# Patient Record
Sex: Male | Born: 1980
Health system: Southern US, Community
[De-identification: ages and names within clinical notes are randomized; demographics above are authoritative.]

## PROBLEM LIST (undated history)

## (undated) DIAGNOSIS — R1115 Cyclical vomiting syndrome unrelated to migraine: Secondary | ICD-10-CM

## (undated) DIAGNOSIS — K828 Other specified diseases of gallbladder: Secondary | ICD-10-CM

## (undated) DIAGNOSIS — K2990 Gastroduodenitis, unspecified, without bleeding: Secondary | ICD-10-CM

## (undated) DIAGNOSIS — F1910 Other psychoactive substance abuse, uncomplicated: Secondary | ICD-10-CM

## (undated) DIAGNOSIS — R109 Unspecified abdominal pain: Secondary | ICD-10-CM

## (undated) DIAGNOSIS — K2970 Gastritis, unspecified, without bleeding: Secondary | ICD-10-CM

## (undated) DIAGNOSIS — G8929 Other chronic pain: Secondary | ICD-10-CM

## (undated) DIAGNOSIS — R112 Nausea with vomiting, unspecified: Secondary | ICD-10-CM

## (undated) DIAGNOSIS — K3184 Gastroparesis: Secondary | ICD-10-CM

## (undated) DIAGNOSIS — A048 Other specified bacterial intestinal infections: Secondary | ICD-10-CM

---

## 2007-02-26 ENCOUNTER — Emergency Department (HOSPITAL_COMMUNITY): Admission: EM | Admit: 2007-02-26 | Discharge: 2007-02-26 | Payer: Self-pay | Admitting: Emergency Medicine

## 2007-03-08 ENCOUNTER — Emergency Department (HOSPITAL_COMMUNITY): Admission: EM | Admit: 2007-03-08 | Discharge: 2007-03-08 | Payer: Self-pay | Admitting: Emergency Medicine

## 2008-11-24 ENCOUNTER — Ambulatory Visit: Payer: Self-pay | Admitting: Cardiology

## 2008-11-24 ENCOUNTER — Inpatient Hospital Stay (HOSPITAL_COMMUNITY): Admission: EM | Admit: 2008-11-24 | Discharge: 2008-11-29 | Payer: Self-pay | Admitting: Emergency Medicine

## 2008-11-26 ENCOUNTER — Encounter (INDEPENDENT_AMBULATORY_CARE_PROVIDER_SITE_OTHER): Payer: Self-pay | Admitting: Internal Medicine

## 2008-12-06 ENCOUNTER — Emergency Department (HOSPITAL_COMMUNITY): Admission: EM | Admit: 2008-12-06 | Discharge: 2008-12-06 | Payer: Self-pay | Admitting: Emergency Medicine

## 2009-02-14 ENCOUNTER — Emergency Department (HOSPITAL_COMMUNITY): Admission: EM | Admit: 2009-02-14 | Discharge: 2009-02-15 | Payer: Self-pay | Admitting: Emergency Medicine

## 2009-10-28 ENCOUNTER — Emergency Department (HOSPITAL_COMMUNITY): Admission: EM | Admit: 2009-10-28 | Discharge: 2009-10-28 | Payer: Self-pay | Admitting: Emergency Medicine

## 2009-11-13 HISTORY — PX: CHOLECYSTECTOMY: SHX55

## 2009-11-16 ENCOUNTER — Emergency Department (HOSPITAL_COMMUNITY): Admission: EM | Admit: 2009-11-16 | Discharge: 2009-11-16 | Payer: Self-pay | Admitting: Emergency Medicine

## 2009-11-18 ENCOUNTER — Inpatient Hospital Stay (HOSPITAL_COMMUNITY): Admission: EM | Admit: 2009-11-18 | Discharge: 2009-11-21 | Payer: Self-pay | Admitting: Emergency Medicine

## 2009-11-20 ENCOUNTER — Encounter (INDEPENDENT_AMBULATORY_CARE_PROVIDER_SITE_OTHER): Payer: Self-pay

## 2009-11-21 ENCOUNTER — Emergency Department (HOSPITAL_COMMUNITY): Admission: EM | Admit: 2009-11-21 | Discharge: 2009-11-22 | Payer: Self-pay | Admitting: Emergency Medicine

## 2009-11-23 ENCOUNTER — Observation Stay (HOSPITAL_COMMUNITY): Admission: EM | Admit: 2009-11-23 | Discharge: 2009-11-25 | Payer: Self-pay | Admitting: Emergency Medicine

## 2009-11-23 DIAGNOSIS — K828 Other specified diseases of gallbladder: Secondary | ICD-10-CM | POA: Insufficient documentation

## 2009-11-23 HISTORY — DX: Other specified diseases of gallbladder: K82.8

## 2009-11-24 ENCOUNTER — Encounter (INDEPENDENT_AMBULATORY_CARE_PROVIDER_SITE_OTHER): Payer: Self-pay

## 2009-11-25 DIAGNOSIS — Z9089 Acquired absence of other organs: Secondary | ICD-10-CM | POA: Insufficient documentation

## 2009-12-07 ENCOUNTER — Emergency Department (HOSPITAL_COMMUNITY): Admission: EM | Admit: 2009-12-07 | Discharge: 2009-12-07 | Payer: Self-pay | Admitting: Emergency Medicine

## 2009-12-08 ENCOUNTER — Observation Stay (HOSPITAL_COMMUNITY): Admission: EM | Admit: 2009-12-08 | Discharge: 2009-12-08 | Payer: Self-pay | Admitting: Emergency Medicine

## 2009-12-08 LAB — CONVERTED CEMR LAB
ALT: 57 units/L
AST: 31 units/L
Albumin: 3.8 g/dL
Alkaline Phosphatase: 61 units/L
BUN: 7 mg/dL
Basophils Absolute: 0.1 10*3/uL
Basophils Relative: 1 %
CO2: 26 meq/L
Calcium: 9.7 mg/dL
Chloride: 106 meq/L
Creatinine, Ser: 0.93 mg/dL
Eosinophils Absolute: 0 10*3/uL
Eosinophils Relative: 0 %
Glucose, Bld: 103 mg/dL
HCT: 38 %
Hemoglobin: 13.1 g/dL
Lymphocytes Relative: 12 %
Lymphs Abs: 1.4 10*3/uL
MCHC: 34.6 g/dL
MCV: 98.1 fL
Monocytes Absolute: 0.7 10*3/uL
Monocytes Relative: 6 %
Neutro Abs: 9.3 10*3/uL
Neutrophils Relative %: 81 %
Platelets: 234 10*3/uL
Potassium: 3.5 meq/L
RBC: 3.87 M/uL
RDW: 12.4 %
Sodium: 140 meq/L
Total Bilirubin: 2.1 mg/dL
Total Protein: 7.2 g/dL
WBC: 11.5 10*3/uL

## 2009-12-09 ENCOUNTER — Emergency Department (HOSPITAL_COMMUNITY): Admission: EM | Admit: 2009-12-09 | Discharge: 2009-12-09 | Payer: Self-pay | Admitting: Emergency Medicine

## 2009-12-09 ENCOUNTER — Ambulatory Visit: Payer: Self-pay | Admitting: Nurse Practitioner

## 2009-12-09 DIAGNOSIS — K2970 Gastritis, unspecified, without bleeding: Secondary | ICD-10-CM

## 2009-12-09 DIAGNOSIS — K2990 Gastroduodenitis, unspecified, without bleeding: Secondary | ICD-10-CM

## 2009-12-09 HISTORY — DX: Gastroduodenitis, unspecified, without bleeding: K29.90

## 2009-12-09 HISTORY — DX: Gastritis, unspecified, without bleeding: K29.70

## 2009-12-11 ENCOUNTER — Emergency Department (HOSPITAL_COMMUNITY): Admission: EM | Admit: 2009-12-11 | Discharge: 2009-12-11 | Payer: Self-pay | Admitting: Emergency Medicine

## 2009-12-12 ENCOUNTER — Inpatient Hospital Stay (HOSPITAL_COMMUNITY): Admission: EM | Admit: 2009-12-12 | Discharge: 2009-12-14 | Payer: Self-pay | Admitting: Emergency Medicine

## 2009-12-14 DIAGNOSIS — A048 Other specified bacterial intestinal infections: Secondary | ICD-10-CM | POA: Insufficient documentation

## 2009-12-14 HISTORY — DX: Other specified bacterial intestinal infections: A04.8

## 2009-12-14 LAB — CONVERTED CEMR LAB
ALT: 43 units/L (ref 0–53)
AST: 23 units/L (ref 0–37)
Albumin: 4.2 g/dL (ref 3.5–5.2)
Alkaline Phosphatase: 53 units/L (ref 39–117)
BUN: 12 mg/dL (ref 6–23)
Basophils Absolute: 0 10*3/uL (ref 0.0–0.1)
Basophils Relative: 0 % (ref 0–1)
Bilirubin, Direct: 0.3 mg/dL (ref 0.0–0.3)
CO2: 27 meq/L (ref 19–32)
Calcium: 9.7 mg/dL (ref 8.4–10.5)
Chloride: 104 meq/L (ref 96–112)
Creatinine, Ser: 0.89 mg/dL (ref 0.40–1.50)
Eosinophils Absolute: 0 10*3/uL (ref 0.0–0.7)
Eosinophils Relative: 1 % (ref 0–5)
Glucose, Bld: 83 mg/dL (ref 70–99)
HCT: 37.7 % — ABNORMAL LOW (ref 39.0–52.0)
Helicobacter pylori, IgM: 1.12 — ABNORMAL HIGH (ref <0.89)
Hemoglobin: 12 g/dL — ABNORMAL LOW (ref 13.0–17.0)
Lymphocytes Relative: 46 % (ref 12–46)
Lymphs Abs: 2.9 10*3/uL (ref 0.7–4.0)
MCHC: 31.8 g/dL (ref 30.0–36.0)
MCV: 97.2 fL (ref 78.0–100.0)
Monocytes Absolute: 0.4 10*3/uL (ref 0.1–1.0)
Monocytes Relative: 7 % (ref 3–12)
Neutro Abs: 3 10*3/uL (ref 1.7–7.7)
Neutrophils Relative %: 47 % (ref 43–77)
Platelets: 261 10*3/uL (ref 150–400)
Potassium: 4 meq/L (ref 3.5–5.3)
RBC: 3.88 M/uL — ABNORMAL LOW (ref 4.22–5.81)
RDW: 12.4 % (ref 11.5–15.5)
Sodium: 141 meq/L (ref 135–145)
Total Bilirubin: 1.8 mg/dL — ABNORMAL HIGH (ref 0.3–1.2)
Total Protein: 7 g/dL (ref 6.0–8.3)
WBC: 6.4 10*3/uL (ref 4.0–10.5)

## 2009-12-15 ENCOUNTER — Inpatient Hospital Stay (HOSPITAL_COMMUNITY): Admission: EM | Admit: 2009-12-15 | Discharge: 2009-12-18 | Payer: Self-pay | Admitting: Emergency Medicine

## 2009-12-18 ENCOUNTER — Encounter (INDEPENDENT_AMBULATORY_CARE_PROVIDER_SITE_OTHER): Payer: Self-pay | Admitting: *Deleted

## 2009-12-21 ENCOUNTER — Emergency Department (HOSPITAL_COMMUNITY): Admission: EM | Admit: 2009-12-21 | Discharge: 2009-12-21 | Payer: Self-pay | Admitting: Emergency Medicine

## 2009-12-22 ENCOUNTER — Encounter (INDEPENDENT_AMBULATORY_CARE_PROVIDER_SITE_OTHER): Payer: Self-pay | Admitting: Nurse Practitioner

## 2009-12-22 ENCOUNTER — Emergency Department (HOSPITAL_COMMUNITY): Admission: EM | Admit: 2009-12-22 | Discharge: 2009-12-22 | Payer: Self-pay | Admitting: Emergency Medicine

## 2009-12-23 ENCOUNTER — Emergency Department (HOSPITAL_COMMUNITY): Admission: EM | Admit: 2009-12-23 | Discharge: 2009-12-23 | Payer: Self-pay | Admitting: Emergency Medicine

## 2010-01-01 ENCOUNTER — Encounter (INDEPENDENT_AMBULATORY_CARE_PROVIDER_SITE_OTHER): Payer: Self-pay | Admitting: Nurse Practitioner

## 2010-02-22 ENCOUNTER — Encounter (INDEPENDENT_AMBULATORY_CARE_PROVIDER_SITE_OTHER): Payer: Self-pay | Admitting: Nurse Practitioner

## 2010-09-15 NOTE — Letter (Signed)
Summary: *HSN Results Follow up  [facility]  [address] [place], [place] [address]   Phone: [phone]  Fax: [phone]      12/18/2009   Matthew Scott [address] [place], [place]  [address]   Dear  Mr. [name],                            ____S.Drinkard,FNP   ____D. Gore,FNP       ____B. McPherson,MD   ____V. Rankins,MD    ____E. Mulberry,MD    ____N. Daphine Deutscher, FNP  ____D. Reche Dixon, MD    ____K. Philipp Deputy, MD    ____Other     This letter is to inform you that your recent test(s):  _______Pap Smear    _______Lab Test     _______X-ray    _______ is within acceptable limits  _______ requires a medication change  _______ requires a follow-up lab visit  _______ requires a follow-up visit with your provider   Comments:  We tried to reach you and was told that you were in the hospital.  Please contact the office at your earliest convenience.       _________________________________________________________ If you have any questions, please contact our office                     Sincerely,  Levon Hedger [facility]            Appended Document: needs F/U Left message on answer machine to return call Gaylyn Cheers RN Dec 31, 2009 10:59  Steward Drone has spoken with the a family member and pt missed his last appt in this office and therefore has not picked up med due to another hospitalization.   Check to see if pt is still in the hospital (print d/c and put on my desk) once pt is reached see what his f/u plan is at this point  Discharge summary printed. Has eligibility appt. 01/26/10 will schedule appt. once he has his orange card, mother states he is doing well. Gaylyn Cheers RN Dec 31, 2009 5:00 pm  d/c received.  will review. n.martin,fnp  Jan 01, 2010  8:23 AM    Clinical Lists Changes

## 2010-09-15 NOTE — Letter (Signed)
Summary: PT Matthew Scott  PT INFORAMTION SHEET   Imported By: Arta Bruce 02/02/2010 15:53:54  _____________________________________________________________________  External Attachment:    Type:   Image     Comment:   External Document

## 2010-09-15 NOTE — Consult Note (Signed)
Summary: Consultation Report//GASTROENTEROLOGIST  Consultation Report//GASTROENTEROLOGIST   Imported By: Arta Bruce 01/06/2010 11:45:39  _____________________________________________________________________  External Attachment:    Type:   Image     Comment:   External Document

## 2010-09-15 NOTE — Miscellaneous (Signed)
Summary: Update as per Hospitalization 12/22/2009  Clinical Lists Changes Pt was to f/u with [facility] on May 11 at 2:00 pm  Observations: Added new observation of PMH BARRETTS: no (01/01/2010 8:23) Added new observation of PMH ESOPHAG: no (01/01/2010 8:23) Added new observation of CHIEF CMPLNT: Abdominal Pain (01/01/2010 8:23) Added new observation of EGD: proximal gastritis most likely from trauma from vomiting.  moderate amount of retained bilious fluid in the stomach (11/24/2009 8:25)      EGD  Procedure date:  11/24/2009  Findings:      proximal gastritis most likely from trauma from vomiting.  moderate amount of retained bilious fluid in the stomach   Dyspepsia History:      A prior EGD has been done which showed no evidence for moderate or severe esophagitis or Barrett's esophagus.       EGD  Procedure date:  11/24/2009  Findings:      proximal gastritis most likely from trauma from vomiting.  moderate amount of retained bilious fluid in the stomach

## 2010-09-15 NOTE — Letter (Signed)
Summary: FAXED REQUESTED RECORDS TO [facility]  FAXED REQUESTED RECORDS TO [facility]   Imported By: Arta Bruce 02/22/2010 10:37:15  _____________________________________________________________________  External Attachment:    Type:   Image     Comment:   External Document

## 2010-09-15 NOTE — Assessment & Plan Note (Signed)
Summary: New - Establish Care   Vital Signs:  Patient profile:   30 year old male Height:      68.25 inches Weight:      187.1 pounds BMI:     28.34 BSA:     1.99 Temp:     98.1 degrees F oral Pulse rate:   65 / minute Pulse rhythm:   regular Resp:     16 per minute BP sitting:   122 / 76  (left arm) Cuff size:   large  Vitals Entered By: Levon Hedger (December 09, 2009 2:15 PM) CC: follow-up visit [facility] Is Patient Diabetic? No Pain Assessment Patient in pain? no       Does patient need assistance? Functional Status Self care Ambulation Normal Comments pt states he takes Valtrex   CC:  follow-up visit [facility].  History of Present Illness:  Pt into the office to establish care. No previous PCP  Hospitalized from November 23, 2009 to November 25, 2009 for persistent nausea and vomiting. S/p laparoscopic cholecystectomy November 20, 2009. Bilirubin elevated at 2.0, fractionated bilirubin showed an indirect of 1.8 with a directed 0.2 CT scan showed possible ill-defined fluid and gas collection in the operative bed consistent with postoperative changes. HIDA scan was subsequently ordered and found to be negative for any evidence of ductal obstruction or cystic duct leak. EGD - essentially normal showing no evidence of obvious ulceration. As of November 25, 2009 he tolerated regular diet.  Pt went to the ER on last night, still with nausea and vomiting. Mono negative Discharge indicates that pt was started on pylera 140mg -125-125 (3 capsules by mouth qid x 10 days) pt is not able to afford this medication so he did not get it. He has been using hycosamine SL tabs. Reports that after the ER visit on yesterday he ate meatloaf for dinner   **Exam shortened due to pt's transportation had to leave** Will have pt f/u in 1 weeks   Habits & Providers  Alcohol-Tobacco-Diet     Alcohol drinks/day: 0     Tobacco Status: never  Exercise-Depression-Behavior     Have you felt down or  hopeless? no     Have you felt little pleasure in things? no     Drug Use: marijuanna  Allergies (verified): No Known Drug Allergies  Past History:  Past Surgical History: Cholecystectomy 11/20/2009  Family History:  non-contributory  Social History: no children tobacco - none ETOH - none marijuana - dailySmoking Status:  never Drug Use:  marijuanna  Review of Systems CV:  Denies chest pain or discomfort. Resp:  Denies cough. GI:  Complains of nausea and vomiting; denies constipation.  Physical Exam  General:  alert.  long dreads Head:  normocephalic.   Msk:  up to the exam table Neurologic:  alert & oriented X3.   Skin:  color normal.   Psych:  Oriented X3.     Impression & Recommendations:  Problem # 1:  GASTRITIS (ICD-535.50) will check h.pylori today pt ordered pepcid s/p hospital stay - will advise him to continue use will wait to see lab results Orders: T-H Pylori Antibody IgM ([id number]) T-Comprehensive Metabolic Panel ([id number]) T-CBC w/Diff ([id number])  Problem # 2:  CHOLECYSTECTOMY, HX OF (ICD-V45.79) advised pt on foods he should avoid BLAND diet  Patient Instructions: 1)  Read the handout.  Please try to avoid the foods that make your stomach worse such as: 2)  Tomato sauce (Spagetti, lasagna, pizza) 3)  Orange juice 4)  Hot sauce 5)  Onions 6)  Try to eat baked foods instead of fried foods. 7)  No hamburgers 8)  Eat chicken (baked) and [place] 9)  May eat rice, potatos, bananas, apples 10)  Follow up in 1 week for stomach issues (will see once more) 11)  Keep your appointment in June for eligibility

## 2010-11-02 LAB — BASIC METABOLIC PANEL
BUN: 12 mg/dL (ref 6–23)
BUN: 6 mg/dL (ref 6–23)
CO2: 28 mEq/L (ref 19–32)
CO2: 29 mEq/L (ref 19–32)
Calcium: 9.4 mg/dL (ref 8.4–10.5)
Calcium: 9.5 mg/dL (ref 8.4–10.5)
Chloride: 102 mEq/L (ref 96–112)
Chloride: 103 mEq/L (ref 96–112)
Creatinine, Ser: 0.89 mg/dL (ref 0.4–1.5)
Creatinine, Ser: 1.03 mg/dL (ref 0.4–1.5)
GFR calc Af Amer: >60 mL/min (ref >60)
GFR calc Af Amer: >60 mL/min (ref >60)
GFR calc non Af Amer: >60 mL/min (ref >60)
GFR calc non Af Amer: >60 mL/min (ref >60)
Glucose, Bld: 89 mg/dL (ref 70–99)
Glucose, Bld: 93 mg/dL (ref 70–99)
Potassium: 3.2 mEq/L — ABNORMAL LOW (ref 3.5–5.1)
Potassium: 3.7 mEq/L (ref 3.5–5.1)
Sodium: 137 mEq/L (ref 135–145)
Sodium: 138 mEq/L (ref 135–145)

## 2010-11-02 LAB — CBC
HCT: 37 % — ABNORMAL LOW (ref 39.0–52.0)
HCT: 38.5 % — ABNORMAL LOW (ref 39.0–52.0)
HCT: 38.8 % — ABNORMAL LOW (ref 39.0–52.0)
HCT: 40.1 % (ref 39.0–52.0)
HCT: 35.8 % — ABNORMAL LOW (ref 39.0–52.0)
HCT: 38 % — ABNORMAL LOW (ref 39.0–52.0)
HCT: 38.7 % — ABNORMAL LOW (ref 39.0–52.0)
HCT: 42 % (ref 39.0–52.0)
Hemoglobin: 12.6 g/dL — ABNORMAL LOW (ref 13.0–17.0)
Hemoglobin: 13 g/dL (ref 13.0–17.0)
Hemoglobin: 13.3 g/dL (ref 13.0–17.0)
Hemoglobin: 13.7 g/dL (ref 13.0–17.0)
Hemoglobin: 12.2 g/dL — ABNORMAL LOW (ref 13.0–17.0)
Hemoglobin: 13.1 g/dL (ref 13.0–17.0)
Hemoglobin: 13.1 g/dL (ref 13.0–17.0)
Hemoglobin: 14.2 g/dL (ref 13.0–17.0)
MCHC: 33.5 g/dL (ref 30.0–36.0)
MCHC: 34.1 g/dL (ref 30.0–36.0)
MCHC: 34.2 g/dL (ref 30.0–36.0)
MCHC: 34.6 g/dL (ref 30.0–36.0)
MCHC: 33.8 g/dL (ref 30.0–36.0)
MCHC: 34 g/dL (ref 30.0–36.0)
MCHC: 34.1 g/dL (ref 30.0–36.0)
MCHC: 34.6 g/dL (ref 30.0–36.0)
MCV: 97.7 fL (ref 78.0–100.0)
MCV: 98.3 fL (ref 78.0–100.0)
MCV: 99 fL (ref 78.0–100.0)
MCV: 99.1 fL (ref 78.0–100.0)
MCV: 98.1 fL (ref 78.0–100.0)
MCV: 98.8 fL (ref 78.0–100.0)
MCV: 99 fL (ref 78.0–100.0)
MCV: 99.4 fL (ref 78.0–100.0)
Platelets: 219 10*3/uL (ref 150–400)
Platelets: 225 10*3/uL (ref 150–400)
Platelets: 231 10*3/uL (ref 150–400)
Platelets: 246 10*3/uL (ref 150–400)
Platelets: 211 10*3/uL (ref 150–400)
Platelets: 228 10*3/uL (ref 150–400)
Platelets: 234 10*3/uL (ref 150–400)
Platelets: 255 10*3/uL (ref 150–400)
RBC: 3.74 MIL/uL — ABNORMAL LOW (ref 4.22–5.81)
RBC: 3.92 MIL/uL — ABNORMAL LOW (ref 4.22–5.81)
RBC: 3.94 MIL/uL — ABNORMAL LOW (ref 4.22–5.81)
RBC: 4.08 MIL/uL — ABNORMAL LOW (ref 4.22–5.81)
RBC: 3.61 MIL/uL — ABNORMAL LOW (ref 4.22–5.81)
RBC: 3.87 MIL/uL — ABNORMAL LOW (ref 4.22–5.81)
RBC: 3.91 MIL/uL — ABNORMAL LOW (ref 4.22–5.81)
RBC: 4.22 MIL/uL (ref 4.22–5.81)
RDW: 11.7 % (ref 11.5–15.5)
RDW: 12.1 % (ref 11.5–15.5)
RDW: 12.2 % (ref 11.5–15.5)
RDW: 12.3 % (ref 11.5–15.5)
RDW: 11.7 % (ref 11.5–15.5)
RDW: 12.2 % (ref 11.5–15.5)
RDW: 12.4 % (ref 11.5–15.5)
RDW: 12.7 % (ref 11.5–15.5)
WBC: 6.4 10*3/uL (ref 4.0–10.5)
WBC: 7.3 10*3/uL (ref 4.0–10.5)
WBC: 8.7 10*3/uL (ref 4.0–10.5)
WBC: 9.1 10*3/uL (ref 4.0–10.5)
WBC: 11.5 10*3/uL — ABNORMAL HIGH (ref 4.0–10.5)
WBC: 9.1 10*3/uL (ref 4.0–10.5)
WBC: 9.8 10*3/uL (ref 4.0–10.5)
WBC: 9.9 10*3/uL (ref 4.0–10.5)

## 2010-11-02 LAB — GLUCOSE, CAPILLARY
Glucose-Capillary: 100 mg/dL — ABNORMAL HIGH (ref 70–99)
Glucose-Capillary: 104 mg/dL — ABNORMAL HIGH (ref 70–99)
Glucose-Capillary: 105 mg/dL — ABNORMAL HIGH (ref 70–99)
Glucose-Capillary: 105 mg/dL — ABNORMAL HIGH (ref 70–99)
Glucose-Capillary: 111 mg/dL — ABNORMAL HIGH (ref 70–99)
Glucose-Capillary: 111 mg/dL — ABNORMAL HIGH (ref 70–99)
Glucose-Capillary: 116 mg/dL — ABNORMAL HIGH (ref 70–99)
Glucose-Capillary: 116 mg/dL — ABNORMAL HIGH (ref 70–99)
Glucose-Capillary: 122 mg/dL — ABNORMAL HIGH (ref 70–99)
Glucose-Capillary: 123 mg/dL — ABNORMAL HIGH (ref 70–99)
Glucose-Capillary: 125 mg/dL — ABNORMAL HIGH (ref 70–99)
Glucose-Capillary: 89 mg/dL (ref 70–99)
Glucose-Capillary: 97 mg/dL (ref 70–99)
Glucose-Capillary: 97 mg/dL (ref 70–99)
Glucose-Capillary: 99 mg/dL (ref 70–99)
Glucose-Capillary: 99 mg/dL (ref 70–99)

## 2010-11-02 LAB — DIFFERENTIAL
Basophils Absolute: 0 10*3/uL (ref 0.0–0.1)
Basophils Absolute: 0 10*3/uL (ref 0.0–0.1)
Basophils Absolute: 0 10*3/uL (ref 0.0–0.1)
Basophils Absolute: 0 10*3/uL (ref 0.0–0.1)
Basophils Absolute: 0 10*3/uL (ref 0.0–0.1)
Basophils Absolute: 0.1 10*3/uL (ref 0.0–0.1)
Basophils Relative: 0 % (ref 0–1)
Basophils Relative: 0 % (ref 0–1)
Basophils Relative: 0 % (ref 0–1)
Basophils Relative: 0 % (ref 0–1)
Basophils Relative: 0 % (ref 0–1)
Basophils Relative: 1 % (ref 0–1)
Eosinophils Absolute: 0 10*3/uL (ref 0.0–0.7)
Eosinophils Absolute: 0 10*3/uL (ref 0.0–0.7)
Eosinophils Absolute: 0 10*3/uL (ref 0.0–0.7)
Eosinophils Absolute: 0 10*3/uL (ref 0.0–0.7)
Eosinophils Absolute: 0 10*3/uL (ref 0.0–0.7)
Eosinophils Absolute: 0 10*3/uL (ref 0.0–0.7)
Eosinophils Relative: 0 % (ref 0–5)
Eosinophils Relative: 0 % (ref 0–5)
Eosinophils Relative: 0 % (ref 0–5)
Eosinophils Relative: 0 % (ref 0–5)
Eosinophils Relative: 0 % (ref 0–5)
Eosinophils Relative: 0 % (ref 0–5)
Lymphocytes Relative: 11 % — ABNORMAL LOW (ref 12–46)
Lymphocytes Relative: 5 % — ABNORMAL LOW (ref 12–46)
Lymphocytes Relative: 12 % (ref 12–46)
Lymphocytes Relative: 14 % (ref 12–46)
Lymphocytes Relative: 5 % — ABNORMAL LOW (ref 12–46)
Lymphocytes Relative: 9 % — ABNORMAL LOW (ref 12–46)
Lymphs Abs: 0.3 10*3/uL — ABNORMAL LOW (ref 0.7–4.0)
Lymphs Abs: 1 10*3/uL (ref 0.7–4.0)
Lymphs Abs: 0.5 10*3/uL — ABNORMAL LOW (ref 0.7–4.0)
Lymphs Abs: 0.8 10*3/uL (ref 0.7–4.0)
Lymphs Abs: 1.3 10*3/uL (ref 0.7–4.0)
Lymphs Abs: 1.4 10*3/uL (ref 0.7–4.0)
Monocytes Absolute: 0.1 10*3/uL (ref 0.1–1.0)
Monocytes Absolute: 0.4 10*3/uL (ref 0.1–1.0)
Monocytes Absolute: 0.2 10*3/uL (ref 0.1–1.0)
Monocytes Absolute: 0.3 10*3/uL (ref 0.1–1.0)
Monocytes Absolute: 0.4 10*3/uL (ref 0.1–1.0)
Monocytes Absolute: 0.7 10*3/uL (ref 0.1–1.0)
Monocytes Relative: 2 % — ABNORMAL LOW (ref 3–12)
Monocytes Relative: 5 % (ref 3–12)
Monocytes Relative: 2 % — ABNORMAL LOW (ref 3–12)
Monocytes Relative: 3 % (ref 3–12)
Monocytes Relative: 5 % (ref 3–12)
Monocytes Relative: 6 % (ref 3–12)
Neutro Abs: 6 10*3/uL (ref 1.7–7.7)
Neutro Abs: 7.6 10*3/uL (ref 1.7–7.7)
Neutro Abs: 7.3 10*3/uL (ref 1.7–7.7)
Neutro Abs: 8.7 10*3/uL — ABNORMAL HIGH (ref 1.7–7.7)
Neutro Abs: 9.1 10*3/uL — ABNORMAL HIGH (ref 1.7–7.7)
Neutro Abs: 9.3 10*3/uL — ABNORMAL HIGH (ref 1.7–7.7)
Neutrophils Relative %: 84 % — ABNORMAL HIGH (ref 43–77)
Neutrophils Relative %: 93 % — ABNORMAL HIGH (ref 43–77)
Neutrophils Relative %: 81 % — ABNORMAL HIGH (ref 43–77)
Neutrophils Relative %: 81 % — ABNORMAL HIGH (ref 43–77)
Neutrophils Relative %: 88 % — ABNORMAL HIGH (ref 43–77)
Neutrophils Relative %: 94 % — ABNORMAL HIGH (ref 43–77)

## 2010-11-02 LAB — URINALYSIS, ROUTINE W REFLEX MICROSCOPIC
Bilirubin Urine: NEGATIVE
Bilirubin Urine: NEGATIVE
Glucose, UA: 100 mg/dL — AB
Glucose, UA: NEGATIVE mg/dL
Glucose, UA: NEGATIVE mg/dL
Hgb urine dipstick: NEGATIVE
Hgb urine dipstick: NEGATIVE
Hgb urine dipstick: NEGATIVE
Ketones, ur: 40 mg/dL — AB
Ketones, ur: 40 mg/dL — AB
Ketones, ur: 40 mg/dL — AB
Nitrite: NEGATIVE
Nitrite: NEGATIVE
Nitrite: NEGATIVE
Protein, ur: NEGATIVE mg/dL
Protein, ur: NEGATIVE mg/dL
Protein, ur: NEGATIVE mg/dL
Specific Gravity, Urine: 1.025 (ref 1.005–1.030)
Specific Gravity, Urine: 1.021 (ref 1.005–1.030)
Specific Gravity, Urine: 1.027 (ref 1.005–1.030)
Urobilinogen, UA: 0.2 mg/dL (ref 0.0–1.0)
Urobilinogen, UA: 0.2 mg/dL (ref 0.0–1.0)
Urobilinogen, UA: 1 mg/dL (ref 0.0–1.0)
pH: 6.5 (ref 5.0–8.0)
pH: 6 (ref 5.0–8.0)
pH: 6.5 (ref 5.0–8.0)

## 2010-11-02 LAB — COMPREHENSIVE METABOLIC PANEL
ALT: 16 U/L (ref 0–53)
ALT: 18 U/L (ref 0–53)
ALT: 21 U/L (ref 0–53)
ALT: 31 U/L (ref 0–53)
ALT: 46 U/L (ref 0–53)
ALT: 57 U/L — ABNORMAL HIGH (ref 0–53)
ALT: 66 U/L — ABNORMAL HIGH (ref 0–53)
AST: 19 U/L (ref 0–37)
AST: 19 U/L (ref 0–37)
AST: 26 U/L (ref 0–37)
AST: 27 U/L (ref 0–37)
AST: 31 U/L (ref 0–37)
AST: 31 U/L (ref 0–37)
AST: 36 U/L (ref 0–37)
Albumin: 3.8 g/dL (ref 3.5–5.2)
Albumin: 4.2 g/dL (ref 3.5–5.2)
Albumin: 4.2 g/dL (ref 3.5–5.2)
Albumin: 3.8 g/dL (ref 3.5–5.2)
Albumin: 4.1 g/dL (ref 3.5–5.2)
Albumin: 4.1 g/dL (ref 3.5–5.2)
Albumin: 4.4 g/dL (ref 3.5–5.2)
Alkaline Phosphatase: 53 U/L (ref 39–117)
Alkaline Phosphatase: 54 U/L (ref 39–117)
Alkaline Phosphatase: 55 U/L (ref 39–117)
Alkaline Phosphatase: 53 U/L (ref 39–117)
Alkaline Phosphatase: 58 U/L (ref 39–117)
Alkaline Phosphatase: 61 U/L (ref 39–117)
Alkaline Phosphatase: 63 U/L (ref 39–117)
BUN: 3 mg/dL — ABNORMAL LOW (ref 6–23)
BUN: 6 mg/dL (ref 6–23)
BUN: 7 mg/dL (ref 6–23)
BUN: 10 mg/dL (ref 6–23)
BUN: 7 mg/dL (ref 6–23)
BUN: 8 mg/dL (ref 6–23)
BUN: 9 mg/dL (ref 6–23)
CO2: 28 mEq/L (ref 19–32)
CO2: 28 mEq/L (ref 19–32)
CO2: 29 mEq/L (ref 19–32)
CO2: 26 mEq/L (ref 19–32)
CO2: 26 mEq/L (ref 19–32)
CO2: 29 mEq/L (ref 19–32)
CO2: 29 mEq/L (ref 19–32)
Calcium: 9 mg/dL (ref 8.4–10.5)
Calcium: 9.3 mg/dL (ref 8.4–10.5)
Calcium: 9.7 mg/dL (ref 8.4–10.5)
Calcium: 9.2 mg/dL (ref 8.4–10.5)
Calcium: 9.4 mg/dL (ref 8.4–10.5)
Calcium: 9.5 mg/dL (ref 8.4–10.5)
Calcium: 9.7 mg/dL (ref 8.4–10.5)
Chloride: 100 mEq/L (ref 96–112)
Chloride: 103 mEq/L (ref 96–112)
Chloride: 104 mEq/L (ref 96–112)
Chloride: 105 mEq/L (ref 96–112)
Chloride: 106 mEq/L (ref 96–112)
Chloride: 108 mEq/L (ref 96–112)
Chloride: 99 mEq/L (ref 96–112)
Creatinine, Ser: 0.86 mg/dL (ref 0.4–1.5)
Creatinine, Ser: 0.91 mg/dL (ref 0.4–1.5)
Creatinine, Ser: 0.99 mg/dL (ref 0.4–1.5)
Creatinine, Ser: 0.85 mg/dL (ref 0.4–1.5)
Creatinine, Ser: 0.93 mg/dL (ref 0.4–1.5)
Creatinine, Ser: 0.93 mg/dL (ref 0.4–1.5)
Creatinine, Ser: 1.02 mg/dL (ref 0.4–1.5)
GFR calc Af Amer: >60 mL/min (ref >60)
GFR calc Af Amer: >60 mL/min (ref >60)
GFR calc Af Amer: >60 mL/min (ref >60)
GFR calc Af Amer: >60 mL/min (ref >60)
GFR calc Af Amer: >60 mL/min (ref >60)
GFR calc Af Amer: >60 mL/min (ref >60)
GFR calc Af Amer: >60 mL/min (ref >60)
GFR calc non Af Amer: >60 mL/min (ref >60)
GFR calc non Af Amer: >60 mL/min (ref >60)
GFR calc non Af Amer: >60 mL/min (ref >60)
GFR calc non Af Amer: >60 mL/min (ref >60)
GFR calc non Af Amer: >60 mL/min (ref >60)
GFR calc non Af Amer: >60 mL/min (ref >60)
GFR calc non Af Amer: >60 mL/min (ref >60)
Glucose, Bld: 107 mg/dL — ABNORMAL HIGH (ref 70–99)
Glucose, Bld: 119 mg/dL — ABNORMAL HIGH (ref 70–99)
Glucose, Bld: 135 mg/dL — ABNORMAL HIGH (ref 70–99)
Glucose, Bld: 103 mg/dL — ABNORMAL HIGH (ref 70–99)
Glucose, Bld: 115 mg/dL — ABNORMAL HIGH (ref 70–99)
Glucose, Bld: 128 mg/dL — ABNORMAL HIGH (ref 70–99)
Glucose, Bld: 137 mg/dL — ABNORMAL HIGH (ref 70–99)
Potassium: 3 mEq/L — ABNORMAL LOW (ref 3.5–5.1)
Potassium: 3.6 mEq/L (ref 3.5–5.1)
Potassium: 3.8 mEq/L (ref 3.5–5.1)
Potassium: 3.4 mEq/L — ABNORMAL LOW (ref 3.5–5.1)
Potassium: 3.4 mEq/L — ABNORMAL LOW (ref 3.5–5.1)
Potassium: 3.5 mEq/L (ref 3.5–5.1)
Potassium: 4.6 mEq/L (ref 3.5–5.1)
Sodium: 137 mEq/L (ref 135–145)
Sodium: 137 mEq/L (ref 135–145)
Sodium: 137 mEq/L (ref 135–145)
Sodium: 137 mEq/L (ref 135–145)
Sodium: 140 mEq/L (ref 135–145)
Sodium: 140 mEq/L (ref 135–145)
Sodium: 141 mEq/L (ref 135–145)
Total Bilirubin: 2 mg/dL — ABNORMAL HIGH (ref 0.3–1.2)
Total Bilirubin: 2.8 mg/dL — ABNORMAL HIGH (ref 0.3–1.2)
Total Bilirubin: 3.1 mg/dL — ABNORMAL HIGH (ref 0.3–1.2)
Total Bilirubin: 1.7 mg/dL — ABNORMAL HIGH (ref 0.3–1.2)
Total Bilirubin: 2.1 mg/dL — ABNORMAL HIGH (ref 0.3–1.2)
Total Bilirubin: 2.1 mg/dL — ABNORMAL HIGH (ref 0.3–1.2)
Total Bilirubin: 2.9 mg/dL — ABNORMAL HIGH (ref 0.3–1.2)
Total Protein: 7 g/dL (ref 6.0–8.3)
Total Protein: 7.2 g/dL (ref 6.0–8.3)
Total Protein: 7.6 g/dL (ref 6.0–8.3)
Total Protein: 7.1 g/dL (ref 6.0–8.3)
Total Protein: 7.2 g/dL (ref 6.0–8.3)
Total Protein: 7.3 g/dL (ref 6.0–8.3)
Total Protein: 7.5 g/dL (ref 6.0–8.3)

## 2010-11-02 LAB — CLOSTRIDIUM DIFFICILE EIA: C difficile Toxins A+B, EIA: NEGATIVE

## 2010-11-02 LAB — POCT I-STAT, CHEM 8
BUN: 3 mg/dL — ABNORMAL LOW (ref 6–23)
Calcium, Ion: 1.17 mmol/L (ref 1.12–1.32)
Chloride: 100 mEq/L (ref 96–112)
Creatinine, Ser: 0.8 mg/dL (ref 0.4–1.5)
Glucose, Bld: 102 mg/dL — ABNORMAL HIGH (ref 70–99)
HCT: 43 % (ref 39.0–52.0)
Hemoglobin: 14.6 g/dL (ref 13.0–17.0)
Potassium: 3.6 mEq/L (ref 3.5–5.1)
Sodium: 137 mEq/L (ref 135–145)
TCO2: 29 mmol/L (ref 0–100)

## 2010-11-02 LAB — LIPASE, BLOOD
Lipase: 18 U/L (ref 11–59)
Lipase: 22 U/L (ref 11–59)
Lipase: 16 U/L (ref 11–59)
Lipase: 20 U/L (ref 11–59)
Lipase: 23 U/L (ref 11–59)

## 2010-11-02 LAB — RAPID URINE DRUG SCREEN, HOSP PERFORMED
Amphetamines: NOT DETECTED
Amphetamines: NOT DETECTED
Amphetamines: NOT DETECTED
Barbiturates: POSITIVE — AB
Barbiturates: NOT DETECTED
Barbiturates: NOT DETECTED
Benzodiazepines: NOT DETECTED
Benzodiazepines: NOT DETECTED
Benzodiazepines: NOT DETECTED
Cocaine: NOT DETECTED
Cocaine: NOT DETECTED
Cocaine: NOT DETECTED
Opiates: POSITIVE — AB
Opiates: POSITIVE — AB
Opiates: POSITIVE — AB
Tetrahydrocannabinol: POSITIVE — AB
Tetrahydrocannabinol: POSITIVE — AB
Tetrahydrocannabinol: POSITIVE — AB

## 2010-11-02 LAB — MAGNESIUM: Magnesium: 2 mg/dL (ref 1.5–2.5)

## 2010-11-02 LAB — POCT CARDIAC MARKERS
CKMB, poc: <1 ng/mL — ABNORMAL LOW (ref 1.0–8.0)
Myoglobin, poc: 74.3 ng/mL (ref 12–200)
Troponin i, poc: <0.05 ng/mL (ref 0.00–0.09)

## 2010-11-02 LAB — HEPATIC FUNCTION PANEL
ALT: 33 U/L (ref 0–53)
AST: 30 U/L (ref 0–37)
Albumin: 4.3 g/dL (ref 3.5–5.2)
Alkaline Phosphatase: 61 U/L (ref 39–117)
Bilirubin, Direct: 0.4 mg/dL — ABNORMAL HIGH (ref 0.0–0.3)
Indirect Bilirubin: 2.8 mg/dL — ABNORMAL HIGH (ref 0.3–0.9)
Total Bilirubin: 3.2 mg/dL — ABNORMAL HIGH (ref 0.3–1.2)
Total Protein: 7.7 g/dL (ref 6.0–8.3)

## 2010-11-02 LAB — TSH: TSH: 0.969 u[IU]/mL (ref 0.350–4.500)

## 2010-11-02 LAB — ETHANOL
Alcohol, Ethyl (B): <5 mg/dL (ref 0–10)
Alcohol, Ethyl (B): <5 mg/dL (ref 0–10)

## 2010-11-02 LAB — RPR: RPR Ser Ql: NONREACTIVE

## 2010-11-02 LAB — HEMOGLOBIN A1C
Hgb A1c MFr Bld: 4.9 % (ref <5.7)
Mean Plasma Glucose: 94 mg/dL (ref <117)

## 2010-11-02 LAB — HIV ANTIBODY (ROUTINE TESTING W REFLEX): HIV: NONREACTIVE

## 2010-11-02 LAB — MONONUCLEOSIS SCREEN: Mono Screen: NEGATIVE

## 2010-11-03 LAB — COMPREHENSIVE METABOLIC PANEL
ALT: 21 U/L (ref 0–53)
ALT: 21 U/L (ref 0–53)
ALT: 28 U/L (ref 0–53)
ALT: 33 U/L (ref 0–53)
AST: 22 U/L (ref 0–37)
AST: 23 U/L (ref 0–37)
AST: 26 U/L (ref 0–37)
AST: 32 U/L (ref 0–37)
Albumin: 3.7 g/dL (ref 3.5–5.2)
Albumin: 4 g/dL (ref 3.5–5.2)
Albumin: 4.1 g/dL (ref 3.5–5.2)
Albumin: 4.1 g/dL (ref 3.5–5.2)
Alkaline Phosphatase: 53 U/L (ref 39–117)
Alkaline Phosphatase: 61 U/L (ref 39–117)
Alkaline Phosphatase: 62 U/L (ref 39–117)
Alkaline Phosphatase: 71 U/L (ref 39–117)
BUN: 10 mg/dL (ref 6–23)
BUN: 6 mg/dL (ref 6–23)
BUN: 7 mg/dL (ref 6–23)
BUN: 9 mg/dL (ref 6–23)
CO2: 26 mEq/L (ref 19–32)
CO2: 27 mEq/L (ref 19–32)
CO2: 27 mEq/L (ref 19–32)
CO2: 29 mEq/L (ref 19–32)
Calcium: 8.8 mg/dL (ref 8.4–10.5)
Calcium: 8.9 mg/dL (ref 8.4–10.5)
Calcium: 9.1 mg/dL (ref 8.4–10.5)
Calcium: 9.7 mg/dL (ref 8.4–10.5)
Chloride: 101 mEq/L (ref 96–112)
Chloride: 108 mEq/L (ref 96–112)
Chloride: 110 mEq/L (ref 96–112)
Chloride: 98 mEq/L (ref 96–112)
Creatinine, Ser: 0.94 mg/dL (ref 0.4–1.5)
Creatinine, Ser: 0.99 mg/dL (ref 0.4–1.5)
Creatinine, Ser: 1.03 mg/dL (ref 0.4–1.5)
Creatinine, Ser: 1.08 mg/dL (ref 0.4–1.5)
GFR calc Af Amer: >60 mL/min (ref >60)
GFR calc Af Amer: >60 mL/min (ref >60)
GFR calc Af Amer: >60 mL/min (ref >60)
GFR calc Af Amer: >60 mL/min (ref >60)
GFR calc non Af Amer: >60 mL/min (ref >60)
GFR calc non Af Amer: >60 mL/min (ref >60)
GFR calc non Af Amer: >60 mL/min (ref >60)
GFR calc non Af Amer: >60 mL/min (ref >60)
Glucose, Bld: 118 mg/dL — ABNORMAL HIGH (ref 70–99)
Glucose, Bld: 121 mg/dL — ABNORMAL HIGH (ref 70–99)
Glucose, Bld: 125 mg/dL — ABNORMAL HIGH (ref 70–99)
Glucose, Bld: 95 mg/dL (ref 70–99)
Potassium: 2.9 mEq/L — ABNORMAL LOW (ref 3.5–5.1)
Potassium: 3.5 mEq/L (ref 3.5–5.1)
Potassium: 3.6 mEq/L (ref 3.5–5.1)
Potassium: 3.9 mEq/L (ref 3.5–5.1)
Sodium: 133 mEq/L — ABNORMAL LOW (ref 135–145)
Sodium: 136 mEq/L (ref 135–145)
Sodium: 141 mEq/L (ref 135–145)
Sodium: 143 mEq/L (ref 135–145)
Total Bilirubin: 0.8 mg/dL (ref 0.3–1.2)
Total Bilirubin: 1.1 mg/dL (ref 0.3–1.2)
Total Bilirubin: 2 mg/dL — ABNORMAL HIGH (ref 0.3–1.2)
Total Bilirubin: 3 mg/dL — ABNORMAL HIGH (ref 0.3–1.2)
Total Protein: 6.8 g/dL (ref 6.0–8.3)
Total Protein: 7 g/dL (ref 6.0–8.3)
Total Protein: 7.5 g/dL (ref 6.0–8.3)
Total Protein: 7.6 g/dL (ref 6.0–8.3)

## 2010-11-03 LAB — URINE MICROSCOPIC-ADD ON

## 2010-11-03 LAB — DIFFERENTIAL
Basophils Absolute: 0 10*3/uL (ref 0.0–0.1)
Basophils Absolute: 0 10*3/uL (ref 0.0–0.1)
Basophils Absolute: 0 10*3/uL (ref 0.0–0.1)
Basophils Absolute: 0 10*3/uL (ref 0.0–0.1)
Basophils Relative: 0 % (ref 0–1)
Basophils Relative: 0 % (ref 0–1)
Basophils Relative: 0 % (ref 0–1)
Basophils Relative: 0 % (ref 0–1)
Eosinophils Absolute: 0 10*3/uL (ref 0.0–0.7)
Eosinophils Absolute: 0 10*3/uL (ref 0.0–0.7)
Eosinophils Absolute: 0 10*3/uL (ref 0.0–0.7)
Eosinophils Absolute: 0.1 10*3/uL (ref 0.0–0.7)
Eosinophils Relative: 0 % (ref 0–5)
Eosinophils Relative: 0 % (ref 0–5)
Eosinophils Relative: 1 % (ref 0–5)
Eosinophils Relative: 1 % (ref 0–5)
Lymphocytes Relative: 10 % — ABNORMAL LOW (ref 12–46)
Lymphocytes Relative: 13 % (ref 12–46)
Lymphocytes Relative: 29 % (ref 12–46)
Lymphocytes Relative: 32 % (ref 12–46)
Lymphs Abs: 1 10*3/uL (ref 0.7–4.0)
Lymphs Abs: 1.2 10*3/uL (ref 0.7–4.0)
Lymphs Abs: 1.7 10*3/uL (ref 0.7–4.0)
Lymphs Abs: 2.2 10*3/uL (ref 0.7–4.0)
Monocytes Absolute: 0.3 10*3/uL (ref 0.1–1.0)
Monocytes Absolute: 0.4 10*3/uL (ref 0.1–1.0)
Monocytes Absolute: 0.4 10*3/uL (ref 0.1–1.0)
Monocytes Absolute: 0.4 10*3/uL (ref 0.1–1.0)
Monocytes Relative: 5 % (ref 3–12)
Monocytes Relative: 5 % (ref 3–12)
Monocytes Relative: 5 % (ref 3–12)
Monocytes Relative: 6 % (ref 3–12)
Neutro Abs: 3.2 10*3/uL (ref 1.7–7.7)
Neutro Abs: 5 10*3/uL (ref 1.7–7.7)
Neutro Abs: 7.3 10*3/uL (ref 1.7–7.7)
Neutro Abs: 8.2 10*3/uL — ABNORMAL HIGH (ref 1.7–7.7)
Neutrophils Relative %: 61 % (ref 43–77)
Neutrophils Relative %: 65 % (ref 43–77)
Neutrophils Relative %: 82 % — ABNORMAL HIGH (ref 43–77)
Neutrophils Relative %: 85 % — ABNORMAL HIGH (ref 43–77)

## 2010-11-03 LAB — HEPATIC FUNCTION PANEL
ALT: 28 U/L (ref 0–53)
AST: 21 U/L (ref 0–37)
Albumin: 3.8 g/dL (ref 3.5–5.2)
Alkaline Phosphatase: 58 U/L (ref 39–117)
Bilirubin, Direct: 0.2 mg/dL (ref 0.0–0.3)
Indirect Bilirubin: 1.8 mg/dL — ABNORMAL HIGH (ref 0.3–0.9)
Total Bilirubin: 2 mg/dL — ABNORMAL HIGH (ref 0.3–1.2)
Total Protein: 7.1 g/dL (ref 6.0–8.3)

## 2010-11-03 LAB — CBC
HCT: 38.9 % — ABNORMAL LOW (ref 39.0–52.0)
HCT: 41 % (ref 39.0–52.0)
HCT: 42 % (ref 39.0–52.0)
HCT: 43.8 % (ref 39.0–52.0)
Hemoglobin: 13.1 g/dL (ref 13.0–17.0)
Hemoglobin: 13.7 g/dL (ref 13.0–17.0)
Hemoglobin: 13.8 g/dL (ref 13.0–17.0)
Hemoglobin: 14.5 g/dL (ref 13.0–17.0)
MCHC: 32.8 g/dL (ref 30.0–36.0)
MCHC: 33.2 g/dL (ref 30.0–36.0)
MCHC: 33.4 g/dL (ref 30.0–36.0)
MCHC: 33.7 g/dL (ref 30.0–36.0)
MCV: 99.3 fL (ref 78.0–100.0)
MCV: 99.5 fL (ref 78.0–100.0)
MCV: 99.7 fL (ref 78.0–100.0)
MCV: 99.8 fL (ref 78.0–100.0)
Platelets: 179 10*3/uL (ref 150–400)
Platelets: 181 10*3/uL (ref 150–400)
Platelets: 195 10*3/uL (ref 150–400)
Platelets: 195 10*3/uL (ref 150–400)
RBC: 3.9 MIL/uL — ABNORMAL LOW (ref 4.22–5.81)
RBC: 4.13 MIL/uL — ABNORMAL LOW (ref 4.22–5.81)
RBC: 4.22 MIL/uL (ref 4.22–5.81)
RBC: 4.39 MIL/uL (ref 4.22–5.81)
RDW: 12.3 % (ref 11.5–15.5)
RDW: 12.3 % (ref 11.5–15.5)
RDW: 12.5 % (ref 11.5–15.5)
RDW: 12.8 % (ref 11.5–15.5)
WBC: 5.3 10*3/uL (ref 4.0–10.5)
WBC: 7.6 10*3/uL (ref 4.0–10.5)
WBC: 8.9 10*3/uL (ref 4.0–10.5)
WBC: 9.6 10*3/uL (ref 4.0–10.5)

## 2010-11-03 LAB — OPIATE, QUANTITATIVE, URINE
Codeine Urine: NEGATIVE NG/ML
Hydrocodone: NEGATIVE NG/ML
Hydromorphone GC/MS Conf: NEGATIVE NG/ML
Morphine, Confirm: 1641 NG/ML — ABNORMAL HIGH
Oxycodone, ur: NEGATIVE NG/ML
Oxymorphone: NEGATIVE NG/ML

## 2010-11-03 LAB — DRUGS OF ABUSE SCREEN W/O ALC, ROUTINE URINE
Amphetamine Screen, Ur: NEGATIVE
Barbiturate Quant, Ur: NEGATIVE
Benzodiazepines.: POSITIVE — AB
Cocaine Metabolites: NEGATIVE
Creatinine,U: 124.2 mg/dL
Marijuana Metabolite: POSITIVE — AB
Methadone: NEGATIVE
Opiate Screen, Urine: POSITIVE — AB
Phencyclidine (PCP): NEGATIVE
Propoxyphene: NEGATIVE

## 2010-11-03 LAB — URINALYSIS, ROUTINE W REFLEX MICROSCOPIC
Bilirubin Urine: NEGATIVE
Glucose, UA: NEGATIVE mg/dL
Hgb urine dipstick: NEGATIVE
Ketones, ur: 15 mg/dL — AB
Nitrite: NEGATIVE
Protein, ur: NEGATIVE mg/dL
Specific Gravity, Urine: 1.028 (ref 1.005–1.030)
Urobilinogen, UA: 0.2 mg/dL (ref 0.0–1.0)
pH: 6 (ref 5.0–8.0)

## 2010-11-03 LAB — GLUCOSE, CAPILLARY: Glucose-Capillary: 120 mg/dL — ABNORMAL HIGH (ref 70–99)

## 2010-11-03 LAB — BENZODIAZEPINE, QUANTITATIVE, URINE
Alprazolam (GC/LC/MS), ur confirm: NEGATIVE NG/ML
Nordiazepam GC/MS Conf: NEGATIVE NG/ML
Oxazepam GC/MS Conf: NEGATIVE NG/ML
Temazepam GC/MS Conf: NEGATIVE NG/ML

## 2010-11-03 LAB — LIPASE, BLOOD
Lipase: 18 U/L (ref 11–59)
Lipase: 21 U/L (ref 11–59)
Lipase: 22 U/L (ref 11–59)
Lipase: 24 U/L (ref 11–59)

## 2010-11-03 LAB — THC (MARIJUANA), URINE, CONFIRMATION: Marijuana, Ur-Confirmation: 103 NG/ML — ABNORMAL HIGH

## 2010-11-07 LAB — COMPREHENSIVE METABOLIC PANEL
ALT: 19 U/L (ref 0–53)
AST: 21 U/L (ref 0–37)
Albumin: 4.1 g/dL (ref 3.5–5.2)
Alkaline Phosphatase: 65 U/L (ref 39–117)
BUN: 11 mg/dL (ref 6–23)
CO2: 29 mEq/L (ref 19–32)
Calcium: 9.5 mg/dL (ref 8.4–10.5)
Chloride: 108 mEq/L (ref 96–112)
Creatinine, Ser: 1.03 mg/dL (ref 0.4–1.5)
GFR calc Af Amer: >60 mL/min (ref >60)
GFR calc non Af Amer: >60 mL/min (ref >60)
Glucose, Bld: 92 mg/dL (ref 70–99)
Potassium: 3.6 mEq/L (ref 3.5–5.1)
Sodium: 142 mEq/L (ref 135–145)
Total Bilirubin: 1.4 mg/dL — ABNORMAL HIGH (ref 0.3–1.2)
Total Protein: 7.3 g/dL (ref 6.0–8.3)

## 2010-11-07 LAB — DIFFERENTIAL
Basophils Absolute: 0 10*3/uL (ref 0.0–0.1)
Basophils Relative: 0 % (ref 0–1)
Eosinophils Absolute: 0 10*3/uL (ref 0.0–0.7)
Eosinophils Relative: 1 % (ref 0–5)
Lymphocytes Relative: 28 % (ref 12–46)
Lymphs Abs: 2 10*3/uL (ref 0.7–4.0)
Monocytes Absolute: 0.6 10*3/uL (ref 0.1–1.0)
Monocytes Relative: 9 % (ref 3–12)
Neutro Abs: 4.5 10*3/uL (ref 1.7–7.7)
Neutrophils Relative %: 63 % (ref 43–77)

## 2010-11-07 LAB — URINALYSIS, ROUTINE W REFLEX MICROSCOPIC
Bilirubin Urine: NEGATIVE
Glucose, UA: NEGATIVE mg/dL
Hgb urine dipstick: NEGATIVE
Ketones, ur: NEGATIVE mg/dL
Nitrite: NEGATIVE
Protein, ur: NEGATIVE mg/dL
Specific Gravity, Urine: 1.027 (ref 1.005–1.030)
Urobilinogen, UA: 0.2 mg/dL (ref 0.0–1.0)
pH: 6 (ref 5.0–8.0)

## 2010-11-07 LAB — CBC
HCT: 42.3 % (ref 39.0–52.0)
Hemoglobin: 13.6 g/dL (ref 13.0–17.0)
MCHC: 32.1 g/dL (ref 30.0–36.0)
MCV: 99.9 fL (ref 78.0–100.0)
Platelets: 192 10*3/uL (ref 150–400)
RBC: 4.23 MIL/uL (ref 4.22–5.81)
RDW: 12.7 % (ref 11.5–15.5)
WBC: 7.2 10*3/uL (ref 4.0–10.5)

## 2010-11-07 LAB — LIPASE, BLOOD: Lipase: 20 U/L (ref 11–59)

## 2010-11-24 LAB — CULTURE, BLOOD (ROUTINE X 2): Culture: NO GROWTH

## 2010-11-24 LAB — COMPREHENSIVE METABOLIC PANEL
ALT: 18 U/L (ref 0–53)
ALT: 20 U/L (ref 0–53)
AST: 28 U/L (ref 0–37)
AST: 41 U/L — ABNORMAL HIGH (ref 0–37)
Albumin: 3.8 g/dL (ref 3.5–5.2)
Albumin: 4.6 g/dL (ref 3.5–5.2)
Alkaline Phosphatase: 62 U/L (ref 39–117)
Alkaline Phosphatase: 73 U/L (ref 39–117)
BUN: 6 mg/dL (ref 6–23)
BUN: 9 mg/dL (ref 6–23)
CO2: 23 mEq/L (ref 19–32)
CO2: 24 mEq/L (ref 19–32)
Calcium: 9.6 mg/dL (ref 8.4–10.5)
Calcium: 9.7 mg/dL (ref 8.4–10.5)
Chloride: 106 mEq/L (ref 96–112)
Chloride: 107 mEq/L (ref 96–112)
Creatinine, Ser: 1.05 mg/dL (ref 0.4–1.5)
Creatinine, Ser: 1.21 mg/dL (ref 0.4–1.5)
GFR calc Af Amer: >60 mL/min (ref >60)
GFR calc Af Amer: >60 mL/min (ref >60)
GFR calc non Af Amer: >60 mL/min (ref >60)
GFR calc non Af Amer: >60 mL/min (ref >60)
Glucose, Bld: 141 mg/dL — ABNORMAL HIGH (ref 70–99)
Glucose, Bld: 144 mg/dL — ABNORMAL HIGH (ref 70–99)
Potassium: 3.7 mEq/L (ref 3.5–5.1)
Potassium: 3.9 mEq/L (ref 3.5–5.1)
Sodium: 138 mEq/L (ref 135–145)
Sodium: 139 mEq/L (ref 135–145)
Total Bilirubin: 2.1 mg/dL — ABNORMAL HIGH (ref 0.3–1.2)
Total Bilirubin: 2.1 mg/dL — ABNORMAL HIGH (ref 0.3–1.2)
Total Protein: 7.2 g/dL (ref 6.0–8.3)
Total Protein: 8.3 g/dL (ref 6.0–8.3)

## 2010-11-24 LAB — DIFFERENTIAL
Basophils Absolute: 0 10*3/uL (ref 0.0–0.1)
Basophils Absolute: 0 10*3/uL (ref 0.0–0.1)
Basophils Relative: 0 % (ref 0–1)
Basophils Relative: 0 % (ref 0–1)
Eosinophils Absolute: 0 10*3/uL (ref 0.0–0.7)
Eosinophils Absolute: 0 10*3/uL (ref 0.0–0.7)
Eosinophils Relative: 0 % (ref 0–5)
Eosinophils Relative: 0 % (ref 0–5)
Lymphocytes Relative: 18 % (ref 12–46)
Lymphocytes Relative: 22 % (ref 12–46)
Lymphs Abs: 1.8 10*3/uL (ref 0.7–4.0)
Lymphs Abs: 2.2 10*3/uL (ref 0.7–4.0)
Monocytes Absolute: 0.5 10*3/uL (ref 0.1–1.0)
Monocytes Absolute: 0.9 10*3/uL (ref 0.1–1.0)
Monocytes Relative: 6 % (ref 3–12)
Monocytes Relative: 8 % (ref 3–12)
Neutro Abs: 5.8 10*3/uL (ref 1.7–7.7)
Neutro Abs: 9 10*3/uL — ABNORMAL HIGH (ref 1.7–7.7)
Neutrophils Relative %: 72 % (ref 43–77)
Neutrophils Relative %: 74 % (ref 43–77)

## 2010-11-24 LAB — CBC
HCT: 37.5 % — ABNORMAL LOW (ref 39.0–52.0)
HCT: 38 % — ABNORMAL LOW (ref 39.0–52.0)
HCT: 40.2 % (ref 39.0–52.0)
HCT: 45 % (ref 39.0–52.0)
Hemoglobin: 12.6 g/dL — ABNORMAL LOW (ref 13.0–17.0)
Hemoglobin: 12.6 g/dL — ABNORMAL LOW (ref 13.0–17.0)
Hemoglobin: 13.7 g/dL (ref 13.0–17.0)
Hemoglobin: 15.3 g/dL (ref 13.0–17.0)
MCHC: 33.2 g/dL (ref 30.0–36.0)
MCHC: 33.6 g/dL (ref 30.0–36.0)
MCHC: 34 g/dL (ref 30.0–36.0)
MCHC: 34.2 g/dL (ref 30.0–36.0)
MCV: 96.9 fL (ref 78.0–100.0)
MCV: 98.2 fL (ref 78.0–100.0)
MCV: 98.5 fL (ref 78.0–100.0)
MCV: 99 fL (ref 78.0–100.0)
Platelets: 163 10*3/uL (ref 150–400)
Platelets: 166 10*3/uL (ref 150–400)
Platelets: 207 10*3/uL (ref 150–400)
Platelets: 225 10*3/uL (ref 150–400)
RBC: 3.82 MIL/uL — ABNORMAL LOW (ref 4.22–5.81)
RBC: 3.84 MIL/uL — ABNORMAL LOW (ref 4.22–5.81)
RBC: 4.14 MIL/uL — ABNORMAL LOW (ref 4.22–5.81)
RBC: 4.57 MIL/uL (ref 4.22–5.81)
RDW: 12.1 % (ref 11.5–15.5)
RDW: 12.4 % (ref 11.5–15.5)
RDW: 12.5 % (ref 11.5–15.5)
RDW: 12.6 % (ref 11.5–15.5)
WBC: 12.1 10*3/uL — ABNORMAL HIGH (ref 4.0–10.5)
WBC: 15.1 10*3/uL — ABNORMAL HIGH (ref 4.0–10.5)
WBC: 8.1 10*3/uL (ref 4.0–10.5)
WBC: 8.9 10*3/uL (ref 4.0–10.5)

## 2010-11-24 LAB — URINALYSIS, ROUTINE W REFLEX MICROSCOPIC
Bilirubin Urine: NEGATIVE
Glucose, UA: NEGATIVE mg/dL
Glucose, UA: NEGATIVE mg/dL
Hgb urine dipstick: NEGATIVE
Hgb urine dipstick: NEGATIVE
Ketones, ur: 40 mg/dL — AB
Ketones, ur: NEGATIVE mg/dL
Nitrite: NEGATIVE
Nitrite: NEGATIVE
Protein, ur: 30 mg/dL — AB
Protein, ur: NEGATIVE mg/dL
Specific Gravity, Urine: 1.006 (ref 1.005–1.030)
Specific Gravity, Urine: 1.033 — ABNORMAL HIGH (ref 1.005–1.030)
Urobilinogen, UA: 1 mg/dL (ref 0.0–1.0)
Urobilinogen, UA: 1 mg/dL (ref 0.0–1.0)
pH: 5.5 (ref 5.0–8.0)
pH: 7 (ref 5.0–8.0)

## 2010-11-24 LAB — HEMOGLOBIN A1C
Hgb A1c MFr Bld: 4.9 % (ref 4.6–6.1)
Mean Plasma Glucose: 94 mg/dL

## 2010-11-24 LAB — AMYLASE: Amylase: 24 U/L — ABNORMAL LOW (ref 27–131)

## 2010-11-24 LAB — BASIC METABOLIC PANEL
BUN: 5 mg/dL — ABNORMAL LOW (ref 6–23)
CO2: 30 mEq/L (ref 19–32)
Calcium: 9.4 mg/dL (ref 8.4–10.5)
Chloride: 107 mEq/L (ref 96–112)
Creatinine, Ser: 1.22 mg/dL (ref 0.4–1.5)
GFR calc Af Amer: >60 mL/min (ref >60)
GFR calc non Af Amer: >60 mL/min (ref >60)
Glucose, Bld: 94 mg/dL (ref 70–99)
Potassium: 3.6 mEq/L (ref 3.5–5.1)
Sodium: 140 mEq/L (ref 135–145)

## 2010-11-24 LAB — URINE CULTURE
Colony Count: NO GROWTH
Colony Count: NO GROWTH
Culture: NO GROWTH
Culture: NO GROWTH
Special Requests: NEGATIVE

## 2010-11-24 LAB — TSH: TSH: 0.41 u[IU]/mL (ref 0.350–4.500)

## 2010-11-24 LAB — URINE MICROSCOPIC-ADD ON

## 2010-11-24 LAB — RAPID URINE DRUG SCREEN, HOSP PERFORMED
Amphetamines: NOT DETECTED
Barbiturates: NOT DETECTED
Benzodiazepines: NOT DETECTED
Cocaine: NOT DETECTED
Opiates: NOT DETECTED
Tetrahydrocannabinol: POSITIVE — AB

## 2010-11-24 LAB — LIPID PANEL
Cholesterol: 150 mg/dL (ref 0–200)
HDL: 52 mg/dL (ref >39)
LDL Cholesterol: 92 mg/dL (ref 0–99)
Total CHOL/HDL Ratio: 2.9 RATIO
Triglycerides: 31 mg/dL (ref <150)
VLDL: 6 mg/dL (ref 0–40)

## 2010-11-24 LAB — GC/CHLAMYDIA PROBE AMP, URINE
Chlamydia, Swab/Urine, PCR: POSITIVE — AB
GC Probe Amp, Urine: NEGATIVE

## 2010-11-24 LAB — LIPASE, BLOOD
Lipase: 16 U/L (ref 11–59)
Lipase: 17 U/L (ref 11–59)

## 2010-12-28 NOTE — Discharge Summary (Signed)
Matthew Scott, Matthew Scott               ACCOUNT NO.:  [id number]   MEDICAL RECORD NO.:  [id number]          PATIENT TYPE:  INP   LOCATION:  5041                         FACILITY:  [facility]   PHYSICIAN:  Michelene Gardener, MD    DATE OF BIRTH:  19-Oct-1980   DATE OF ADMISSION:  11/24/2008  DATE OF DISCHARGE:  11/29/2008                               DISCHARGE SUMMARY   The patient left against medical advice on November 29, 2008.   DIAGNOSES WHEN THE PATIENT LEFT THE HOSPITAL:  1. Abdominal pain, most likely related to biliary colic and biliary      dyskinesia.  2. Nausea and vomiting, again related to biliary colic and biliary      dyskinesia.  3. History of alcohol abuse and the patient quit drinking around 3      months.  4. Leukocytosis that resolved.  5. Marijuana abuse.   DISCHARGE MEDICATIONS:  None was given since the patient left against  medical advice.   CONSULTATIONS:  1. GI consult.  I discussed this case with Dr. Loreta Ave as she is supposed      to see him, but the patient left the hospital against medical      advice before evaluation.  2. Surgical consult by Dr. Bertram Savin done on April 17.   PROCEDURES:  None.   DIAGNOSTIC STUDIES:  1. CT scan of the abdomen with contrast on April 13 showed no acute      abnormality.  2. CT scan of the pelvis with contrast on April 13 showed a small      amount of free peritoneal fluid, but otherwise normal examination.      3.  Abdominal ultrasound on April 16 showed a sludge in the      gallbladder with no sonographic evidence of acute cholecystitis.  3. HIDA Scan on April 16 showed normal study with decreased ejection      fraction of 18% consistent with gallbladder dysfunction.   FOLLOWUP:  The patient was advised to follow with his primary doctor as  soon as possible.   COURSE OF HOSPITALIZATION:  1. Abdominal pain.  This patient was admitted to the hospital on April      13.  At that time, the patient was initially treated  symptomatically with pain medications in addition to IV fluids.  He      was kept n.p.o. and then his diet was advanced to clear liquid      diet.  CT scan of the abdomen was done on April 13 and came to be      negative.  CT scan of the pelvis was done on April 13 and again it      was negative.  The patient initially responded well to medical      treatment, then he started to have more pain, especially with diet      and in the morning.  He had 2 severe episodes of abdominal pain on      April 15 and April 16.  At that point, it was felt it was necessary  to reevaluate him, so a repeat lipase and amylase were done and      they came to be negative.  Abdominal ultrasound was done again in      April 16 and showed sludge in the gallbladder without evidence of      gallstones.  I consulted Dr. Loreta Ave on April 16 and discussed the      case with her over the phone and she recommended HIDA scan to rule      out gallbladder dyskinesia.  The HIDA scan was done on the same day      on April 16 and it showed gallbladder dysfunction with ejection      fraction of 18%.  Surgery was consulted on April 17 and the patient      was seen by Dr. Bertram Savin on the same day.  Her recommendation is      to take the patient for cholecystectomy.  The patient was taken to      the OR on the same day, but the surgery was postponed because of      other emergency surgery.  During the same night, the patient      refused to stay for further evaluation and he signed against      medical advice.  At that time, doctor on call at nighttime was      called because the patient went to leave against medical advice and      the risks were explained by the on-call physician, but the patient      insisted to leave.  2. Leukocytosis, that might be secondary to dehydration and stress and      that is resolved with IV fluids.  3. Nausea and vomiting that was secondary to gallbladder dyskinesia      and the patient was  treated symptomatically.  4. Alcohol abuse.  This patient used to be a heavy alcohol drinker,      and he quit alcohol drinking 3 months ago.  Around 3 months ago, he      started having this kind of pain and there was suspicion of      gastritis versus peptic ulcer disease and he was given PPI.  As      mentioned above, Dr. Loreta Ave was consulted to rule out peptic ulcer      disease and for possible endoscopy, but again the patient did not      wait until his full evaluation.  5. Marijuana abuse.  The patient was consulted at during this      hospitalization about drug use.   Total assessment time is 40 minutes.      Michelene Gardener, MD  Electronically Signed     NAE/MEDQ  D:  11/30/2008  T:  11/30/2008  Job:  [id number]

## 2010-12-28 NOTE — H&P (Signed)
NAME:  Matthew Scott, Matthew Scott               ACCOUNT NO.:  [id number]   MEDICAL RECORD NO.:  [id number]          PATIENT TYPE:  EMS   LOCATION:  MAJO                         FACILITY:  [facility]   PHYSICIAN:  Eduard Clos, MDDATE OF BIRTH:  [birth year]   DATE OF ADMISSION:  11/24/2008  DATE OF DISCHARGE:                              HISTORY & PHYSICAL   PRIMARY CARE PHYSICIAN:  Unassigned.   CHIEF COMPLAINT:  Abdominal pain.   HISTORY OF PRESENT ILLNESS:  A 30 year old male with no significant past  medical history presented here complaining of abdominal pain.  Patient  stated that the abdominal pain woke him up from sleep this morning at  around 10:00 a.m. which was colicky, epigastric in nature, nonradiating,  constant, increased with food.  He had one episode of diarrhea which was  watery but no blood in it.  He had been vomiting about 6-7 times with  some blood streaks in it, no obvious frank blood or coffee-ground  emesis.  Patient had acute abdominal series which did not show any acute  findings, and he has been admitted for further observation.   Patient denies any chest pain, shortness of breath, weakness of limbs,  loss of consciousness, denies any headache, fever or chills or any  dysuria or discharge.  This patient was recently treated for genital  herpes and is on Valtrex, dose not known for daily dosing.  He was told  that he was supposed to take it for a year.   PAST MEDICAL HISTORY:  Nothing significant.   PAST SURGICAL HISTORY:  None.   MEDICATIONS:  Prior to admission:  The patient states he is on Valtrex,  dose not known.   ALLERGIES:  No known drug allergies.   FAMILY HISTORY:  Nothing significant.  Patient says nobody in his family  has any colon cancer.   SOCIAL HISTORY:  Patient smokes cigars, has been advised to quit  smoking.  He drinks alcohol once in 2 weeks.  He occasionally abuses  marijuana.   REVIEW OF SYSTEMS:  As per history of present illness.   Nothing else  significant.   PHYSICAL EXAMINATION:  GENERAL:  The patient is examined at bedside, not  in acute distress.  VITAL SIGNS:  Blood pressure is 130/80, pulse 50 per minute, temperature  97.4, respirations 18 per minute, O2 saturation 99%.  HEENT:  Anicteric, no pallor.  CHEST:  Bilateral air entry present.  No rhonchi, no crepitation.  HEART:  S1, S2 heard.  ABDOMEN:  Nontender, bowel sounds heard, no guarding, no rigidity, no  discoloration.  CENTRAL NERVOUS SYSTEM:  Alert, awake, oriented to time, place and  person.  Moves upper and lower extremities 5/5.  EXTREMITIES:  Peripheral pulses felt.  No edema.   LABORATORIES:  CBC:  WBCs 8.1, hemoglobin 15.3, hematocrit 45, platelets  225, neutrophils 72%.  Complete metabolic panel:  Sodium 139, potassium  3.7, chloride 106, carbon dioxide 23, glucose 144, BUN 9, creatinine  1.2, alkaline phosphatase 73, AST 41, ALT 20, total protein 8.3, calcium  9.7, lipase 16.  UA is amber in color.  Urine:  Glucose negative,  bilirubin small, ketones 40, nitrites negative, leukocyte trace, WBC 3-  6.  Culture is pending.  Acute abdominal series shows no acute chest  findings, nonspecific bowel gas pattern with a small amount of stool.   ASSESSMENT:  1. Abdominal pain, unclear etiology, probably could be from      gastroenteritis.  2. Dehydration.  3. Possible urinary tract infection.  4. Marijuana abuse.  5. Recent genital herpes.   PLAN:  Will admit patient to medical floor.  Will keep patient on D5  normal saline IV fluids, Cipro for possible UTI.  Get a urine culture.  Would also get a CAT scan of abdomen and pelvis.  We will follow up with  LFTs closely.  We will keep patient n.p.o. for now.  We will get stool  studies if patient has further diarrhea.  Further recommendations as  condition evolves.      Eduard Clos, MD  Electronically Signed     ANK/MEDQ  D:  11/24/2008  T:  11/24/2008  Job:  [id number]

## 2010-12-28 NOTE — Consult Note (Signed)
Matthew Scott, Matthew Scott               ACCOUNT NO.:  [id number]   MEDICAL RECORD NO.:  [id number]          PATIENT TYPE:  INP   LOCATION:  5041                         FACILITY:  [facility]   PHYSICIAN:  Lennie Muckle, MD      DATE OF BIRTH:  [birth year]   DATE OF CONSULTATION:  DATE OF DISCHARGE:  11/29/2008                                 CONSULTATION   REASON FOR CONSULT:  Abdominal pain, question of biliary dyskinesia.   Matthew Scott is a 30 year old male who is admitted on November 24, 2008, due  to epigastric discomfort.  He states he awoke at approximately, he told  me 10 in the morning with acute epigastric discomfort.  He had 1 episode  of diarrhea at home.  He then had nausea and vomiting.  The pain has  been episodic.  It is worse in the morning and since his hospitalization  he awakens at 6 a.m.  He said it is not worsened by eating, but he does  have nausea every time he eats.  He feels somewhat better with the pain  on occasions.  During his workup, he had received a CT scan which was  normal.  Ultrasound on November 28, 2008, did reveal sludge within the  gallbladder.  No visible stone.  Common bile duct was normal.  He had a  HIDA scan also performed on November 28, 2008, ejection fraction of only  18%.  He did have some mild elevation of his liver enzymes when he first  was admitted with a bilirubin of 2.1.  AST was 41, it was only slightly  above normal.  ALT was normal at 20.  Alkaline phosphatase was 73.  Liver enzymes are normalized except for the bilirubin on November 25, 2008,  was still 2.1.  He has had no jaundice.  White count was normal at 8.9.  He states he continues to have epigastric discomfort.  He has not had  any history of heartburn or gastritis or ulcers.  He does not use  NSAIDs.   PAST MEDICAL HISTORY:  Negative.   SURGICAL HISTORY:  Negative.   He is on Valtrex.   No allergies.   FAMILY HISTORY:  Negative.   SOCIAL HISTORY:  He does smoke cigars.  He does smoke  marijuana and  occasional alcohol use.   REVIEW OF SYMPTOMS:  Negative.   PHYSICAL EXAMINATION:  GENERAL:  He is lying in bed, in no acute  distress.  VITAL SIGNS:  Temperature 98.9, blood pressure 134/77, and pulse 53.  HEENT:  Sclerae are clear.  NECK:  Supple.  No lymphadenopathy.  CHEST:  Clear to auscultation bilaterally.  CARDIOVASCULAR:  Regular rate and rhythm.  ABDOMEN:  Soft.  He has some mild epigastric discomfort, but no  peritoneal signs.  SKIN:  No rashes.  No jaundice.  MUSCULOSKELETAL:  No deformities or edema.   ASSESSMENT AND PLAN:  Questionable biliary colic and biliary dyskinesia.  It can also be an upper gastrointestinal problem.  I think he probably  would benefit from a cholecystectomy.  I have had a discussion today  with Matthew Scott about the possible diagnosis. He agrees to have the  surgery. Therefore, I have gone ahead and posted him into the operating  room today and hopefully this will be done, so that he will be able to  be discharged home tonight or tomorrow.  All questions were answered and  I have tentatively posted him for the operating room.      Lennie Muckle, MD  Electronically Signed     ALA/MEDQ  D:  11/29/2008  T:  11/30/2008  Job:  [id number]

## 2011-02-05 ENCOUNTER — Emergency Department (HOSPITAL_COMMUNITY): Payer: Self-pay

## 2011-02-05 ENCOUNTER — Emergency Department (HOSPITAL_COMMUNITY)
Admission: EM | Admit: 2011-02-05 | Discharge: 2011-02-06 | Disposition: A | Discharge status: Home / Self Care | Payer: Self-pay | Attending: Emergency Medicine | Admitting: Emergency Medicine

## 2011-02-05 DIAGNOSIS — R112 Nausea with vomiting, unspecified: Secondary | ICD-10-CM | POA: Insufficient documentation

## 2011-02-05 DIAGNOSIS — G8929 Other chronic pain: Secondary | ICD-10-CM | POA: Insufficient documentation

## 2011-02-05 DIAGNOSIS — Z9089 Acquired absence of other organs: Secondary | ICD-10-CM | POA: Insufficient documentation

## 2011-02-05 DIAGNOSIS — R1011 Right upper quadrant pain: Secondary | ICD-10-CM | POA: Insufficient documentation

## 2011-02-05 LAB — URINALYSIS, ROUTINE W REFLEX MICROSCOPIC
Glucose, UA: NEGATIVE mg/dL
Hgb urine dipstick: NEGATIVE
Ketones, ur: 15 mg/dL — AB
Leukocytes, UA: NEGATIVE
Nitrite: NEGATIVE
Protein, ur: NEGATIVE mg/dL
Specific Gravity, Urine: 1.03 (ref 1.005–1.030)
Urobilinogen, UA: 0.2 mg/dL (ref 0.0–1.0)
pH: 5.5 (ref 5.0–8.0)

## 2011-02-05 LAB — COMPREHENSIVE METABOLIC PANEL
ALT: 14 U/L (ref 0–53)
AST: 20 U/L (ref 0–37)
Albumin: 3.9 g/dL (ref 3.5–5.2)
Alkaline Phosphatase: 70 U/L (ref 39–117)
BUN: 10 mg/dL (ref 6–23)
CO2: 24 mEq/L (ref 19–32)
Calcium: 9.2 mg/dL (ref 8.4–10.5)
Chloride: 104 mEq/L (ref 96–112)
Creatinine, Ser: 0.74 mg/dL (ref 0.50–1.35)
GFR calc Af Amer: >60 mL/min (ref >60)
GFR calc non Af Amer: >60 mL/min (ref >60)
Glucose, Bld: 90 mg/dL (ref 70–99)
Potassium: 3.9 mEq/L (ref 3.5–5.1)
Sodium: 136 mEq/L (ref 135–145)
Total Bilirubin: 1.4 mg/dL — ABNORMAL HIGH (ref 0.3–1.2)
Total Protein: 6.8 g/dL (ref 6.0–8.3)

## 2011-02-05 LAB — CBC
HCT: 40.4 % (ref 39.0–52.0)
Hemoglobin: 13.5 g/dL (ref 13.0–17.0)
MCH: 31.5 pg (ref 26.0–34.0)
MCHC: 33.4 g/dL (ref 30.0–36.0)
MCV: 94.2 fL (ref 78.0–100.0)
Platelets: 190 10*3/uL (ref 150–400)
RBC: 4.29 MIL/uL (ref 4.22–5.81)
RDW: 12.3 % (ref 11.5–15.5)
WBC: 11 10*3/uL — ABNORMAL HIGH (ref 4.0–10.5)

## 2011-02-05 LAB — ETHANOL: Alcohol, Ethyl (B): <11 mg/dL (ref 0–11)

## 2011-02-05 LAB — DIFFERENTIAL
Basophils Absolute: 0 10*3/uL (ref 0.0–0.1)
Basophils Relative: 0 % (ref 0–1)
Eosinophils Absolute: 0 10*3/uL (ref 0.0–0.7)
Eosinophils Relative: 0 % (ref 0–5)
Lymphocytes Relative: 6 % — ABNORMAL LOW (ref 12–46)
Lymphs Abs: 0.7 10*3/uL (ref 0.7–4.0)
Monocytes Absolute: 0.3 10*3/uL (ref 0.1–1.0)
Monocytes Relative: 3 % (ref 3–12)
Neutro Abs: 10 10*3/uL — ABNORMAL HIGH (ref 1.7–7.7)
Neutrophils Relative %: 91 % — ABNORMAL HIGH (ref 43–77)

## 2011-02-05 LAB — LIPASE, BLOOD: Lipase: 13 U/L (ref 11–59)

## 2011-02-16 ENCOUNTER — Emergency Department (HOSPITAL_COMMUNITY): Payer: Self-pay

## 2011-02-16 ENCOUNTER — Inpatient Hospital Stay (HOSPITAL_COMMUNITY)
Admission: EM | Admit: 2011-02-16 | Discharge: 2011-02-18 | DRG: 392 | Disposition: A | Discharge status: Home / Self Care | Payer: Self-pay | Attending: Internal Medicine | Admitting: Internal Medicine

## 2011-02-16 DIAGNOSIS — F1210 Cannabis abuse, uncomplicated: Secondary | ICD-10-CM | POA: Diagnosis present

## 2011-02-16 DIAGNOSIS — F5089 Other specified eating disorder: Secondary | ICD-10-CM | POA: Diagnosis present

## 2011-02-16 DIAGNOSIS — R109 Unspecified abdominal pain: Secondary | ICD-10-CM | POA: Diagnosis present

## 2011-02-16 DIAGNOSIS — R112 Nausea with vomiting, unspecified: Principal | ICD-10-CM | POA: Diagnosis present

## 2011-02-16 DIAGNOSIS — K589 Irritable bowel syndrome without diarrhea: Secondary | ICD-10-CM | POA: Diagnosis present

## 2011-02-16 DIAGNOSIS — K3184 Gastroparesis: Secondary | ICD-10-CM | POA: Diagnosis present

## 2011-02-16 LAB — RAPID URINE DRUG SCREEN, HOSP PERFORMED
Amphetamines: NOT DETECTED
Barbiturates: NOT DETECTED
Benzodiazepines: NOT DETECTED
Cocaine: NOT DETECTED
Opiates: NOT DETECTED
Tetrahydrocannabinol: POSITIVE — AB

## 2011-02-16 LAB — DIFFERENTIAL
Basophils Absolute: 0 10*3/uL (ref 0.0–0.1)
Basophils Relative: 0 % (ref 0–1)
Eosinophils Absolute: 0.1 10*3/uL (ref 0.0–0.7)
Eosinophils Relative: 0 % (ref 0–5)
Lymphocytes Relative: 22 % (ref 12–46)
Lymphs Abs: 2.5 10*3/uL (ref 0.7–4.0)
Monocytes Absolute: 0.6 10*3/uL (ref 0.1–1.0)
Monocytes Relative: 6 % (ref 3–12)
Neutro Abs: 8 10*3/uL — ABNORMAL HIGH (ref 1.7–7.7)
Neutrophils Relative %: 71 % (ref 43–77)

## 2011-02-16 LAB — COMPREHENSIVE METABOLIC PANEL
ALT: 15 U/L (ref 0–53)
AST: 20 U/L (ref 0–37)
Albumin: 3.9 g/dL (ref 3.5–5.2)
Alkaline Phosphatase: 72 U/L (ref 39–117)
BUN: 12 mg/dL (ref 6–23)
CO2: 27 mEq/L (ref 19–32)
Calcium: 9.3 mg/dL (ref 8.4–10.5)
Chloride: 105 mEq/L (ref 96–112)
Creatinine, Ser: 0.87 mg/dL (ref 0.50–1.35)
GFR calc Af Amer: >60 mL/min (ref >60)
GFR calc non Af Amer: >60 mL/min (ref >60)
Glucose, Bld: 137 mg/dL — ABNORMAL HIGH (ref 70–99)
Potassium: 3.7 mEq/L (ref 3.5–5.1)
Sodium: 142 mEq/L (ref 135–145)
Total Bilirubin: 0.8 mg/dL (ref 0.3–1.2)
Total Protein: 7.2 g/dL (ref 6.0–8.3)

## 2011-02-16 LAB — URINALYSIS, ROUTINE W REFLEX MICROSCOPIC
Bilirubin Urine: NEGATIVE
Glucose, UA: NEGATIVE mg/dL
Hgb urine dipstick: NEGATIVE
Ketones, ur: 15 mg/dL — AB
Leukocytes, UA: NEGATIVE
Nitrite: NEGATIVE
Protein, ur: NEGATIVE mg/dL
Specific Gravity, Urine: 1.031 — ABNORMAL HIGH (ref 1.005–1.030)
Urobilinogen, UA: 0.2 mg/dL (ref 0.0–1.0)
pH: 5.5 (ref 5.0–8.0)

## 2011-02-16 LAB — CBC
HCT: 37.4 % — ABNORMAL LOW (ref 39.0–52.0)
Hemoglobin: 12.8 g/dL — ABNORMAL LOW (ref 13.0–17.0)
MCH: 31.8 pg (ref 26.0–34.0)
MCHC: 34.2 g/dL (ref 30.0–36.0)
MCV: 93 fL (ref 78.0–100.0)
Platelets: 226 10*3/uL (ref 150–400)
RBC: 4.02 MIL/uL — ABNORMAL LOW (ref 4.22–5.81)
RDW: 12.3 % (ref 11.5–15.5)
WBC: 10.8 10*3/uL — ABNORMAL HIGH (ref 4.0–10.5)

## 2011-02-16 LAB — LIPASE, BLOOD: Lipase: 26 U/L (ref 11–59)

## 2011-02-17 ENCOUNTER — Observation Stay (HOSPITAL_COMMUNITY): Payer: Self-pay

## 2011-02-17 LAB — CBC
HCT: 34.1 % — ABNORMAL LOW (ref 39.0–52.0)
Hemoglobin: 11.4 g/dL — ABNORMAL LOW (ref 13.0–17.0)
MCH: 31.1 pg (ref 26.0–34.0)
MCHC: 33.4 g/dL (ref 30.0–36.0)
MCV: 93.2 fL (ref 78.0–100.0)
Platelets: 229 10*3/uL (ref 150–400)
RBC: 3.66 MIL/uL — ABNORMAL LOW (ref 4.22–5.81)
RDW: 12.8 % (ref 11.5–15.5)
WBC: 7.5 10*3/uL (ref 4.0–10.5)

## 2011-02-17 LAB — MRSA PCR SCREENING: MRSA by PCR: NEGATIVE

## 2011-02-17 LAB — HEMOGLOBIN A1C
Hgb A1c MFr Bld: 5.5 % (ref <5.7)
Mean Plasma Glucose: 111 mg/dL (ref <117)

## 2011-02-17 MED ORDER — TECHNETIUM TC 99M SULFUR COLLOID
2.0000 | Freq: Once | INTRAVENOUS | Status: AC | PRN
  Administered 2011-02-17: 2 via INTRAVENOUS

## 2011-02-18 LAB — COMPREHENSIVE METABOLIC PANEL
ALT: 11 U/L (ref 0–53)
AST: 14 U/L (ref 0–37)
Albumin: 3 g/dL — ABNORMAL LOW (ref 3.5–5.2)
Alkaline Phosphatase: 57 U/L (ref 39–117)
BUN: 9 mg/dL (ref 6–23)
CO2: 27 mEq/L (ref 19–32)
Calcium: 8.9 mg/dL (ref 8.4–10.5)
Chloride: 107 mEq/L (ref 96–112)
Creatinine, Ser: 0.81 mg/dL (ref 0.50–1.35)
GFR calc Af Amer: >60 mL/min (ref >60)
GFR calc non Af Amer: >60 mL/min (ref >60)
Glucose, Bld: 90 mg/dL (ref 70–99)
Potassium: 3.6 mEq/L (ref 3.5–5.1)
Sodium: 141 mEq/L (ref 135–145)
Total Bilirubin: 1.2 mg/dL (ref 0.3–1.2)
Total Protein: 6 g/dL (ref 6.0–8.3)

## 2011-02-19 NOTE — Consult Note (Signed)
NAMEMarland Scott  SAVAUGHN, KARWOWSKI NO.:  [id number]  MEDICAL RECORD NO.:  [id number]  LOCATION:  4506                         FACILITY:  [facility]  PHYSICIAN:  Shirley Friar, MDDATE OF BIRTH:  Apr 03, 1981  DATE OF CONSULTATION: DATE OF DISCHARGE:                                CONSULTATION   REQUESTING PHYSICIAN:  Zannie Cove, MD.  INDICATIONS:  Abdominal pain, nausea, and vomiting.  HISTORY OF PRESENT ILLNESS:  Mr. Matthew Scott is a 30 year old black male who was last seen in our office by Dr. Randa Evens on Dec 25, 2009, and has a history of recurrent abdominal pain and nausea, vomiting.  He is status post cholecystectomy in 2011 for biliary dyskinesia which did not relieve his pain.  He has had multiple hospitalizations and ER visits for this recurrent abdominal pain, nausea and vomiting.  He last had an upper endoscopy in April 2011 which showed chronic active gastritis with H. pylori.  He was readmitted on February 16, 2011, due to acute onset of right-sided abdominal pain that was 10/10 in intensity with recurrent vomiting multiple times.  He reported seeing some trace streaks of blood in his vomitus.  The pain was on the right side and nonradiating and would hurt more when he lay on the right side.  Denied any association with eating or moving his bowels.  He says that cold temperature "gives me chills" but denied any fevers.  He denies any NSAIDs or alcohol use. Currently states that his nausea, vomiting, and abdominal pain have completely resolved and that he is hungry and wants to eat.  He just came back from gastric emptying scan and the results are pending at this time.  PAST MEDICAL HISTORY: 1. History of H. pylori gastritis. 2. History of laparoscopic cholecystectomy. 3. History of polysubstance abuse. 4. History of herpes.  CURRENT MEDICATIONS: 1. Protonix 40 mg IV daily. 2. Additional medicines given p.r.n. (The patient reports being at     home on Phenergan  as an outpatient recently and denies any narcotic     pain medicines).  ALLERGIES:  No known drug allergies.  FAMILY HISTORY:  Noncontributory.  SOCIAL HISTORY:  Positive marijuana.  Denies other drugs.  Denies tobacco or alcohol.  REVIEW OF SYSTEMS:  Negative from GI standpoint except as stated above.  PHYSICAL EXAMINATION:  VITAL SIGNS:  Temperature 97.7, pulse 48, blood pressure 108/52. GENERAL:  Alert, well nourished. ABDOMEN:  Soft, nontender, nondistended.  Positive bowel sounds.  LABORATORY DATA:  White blood count 7.5, hemoglobin 11.4, platelet count 229.  IMPRESSION:  A 30 year old black male with recurrent nausea, vomiting, and right-sided abdominal pain which has been evaluated in the past extensively without any source found.  In the past, he had been on narcotic pain medicines, so a gastric emptying scan was never ordered because of the use of those pain medicines.  He had a gastric emptying scan today in the setting of recent narcotic pain medicines during ER evaluation/admission, but we should see what his gastric emptying times are.  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  The patient wants to eat and has no further symptoms,  and I do not think an upper endoscopy is indicated at this time.  We will start a regular diet, and if he tolerates his diet, we can discharge tomorrow from a GI standpoint with outpatient followup with Dr. Randa Evens.  Please call us back if need be.  Thanks for this consultation.     Shirley Friar, MD     VCS/MEDQ  D:  02/17/2011  T:  02/18/2011  Job:  [id number]  cc:   Fayrene Fearing L. Malon Kindle., M.D. Willis Modena, MD  Electronically Signed by Charlott Rakes MD on 02/19/2011 03:02:09 PM

## 2011-02-20 NOTE — Discharge Summary (Signed)
NAMEMarland Kitchen  Matthew Scott, Matthew Scott NO.:  [id number]  MEDICAL RECORD NO.:  [id number]  LOCATION:  4506                         FACILITY:  [facility]  PHYSICIAN:  Marinda Elk, M.D.DATE OF BIRTH:  1980/12/01  DATE OF ADMISSION:  02/16/2011 DATE OF DISCHARGE:  02/18/2011                              DISCHARGE SUMMARY   PRIMARY CARE FACILITY:  [facility].  GASTROENTEROLOGIST:  Willis Modena, MD  DISCHARGE DIAGNOSES: 1. 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 2. Polysubstance abuse.  DISCHARGE MEDICATIONS: 1. Promethazine 25 mg t.i.d. 2. Zofran 4 mg 1 tablet q.8 h. p.r.n. for nausea.  PROCEDURES PERFORMED:  Gastric emptying study that showed delayed gastric emptying study about 44%.  The patient was taking narcotics before this.  Abdominal x-ray that showed no acute changes.  CONSULTATIONS:  Shirley Friar, MD, [facility].  BRIEF ADMITTING HISTORY AND PHYSICAL:  This is a 30 year old African American gentleman with multiple admissions to the hospital due to nausea, vomiting, abdominal pain.  At one point in August 2001, he was found to biliary dyskinesia and it was felt symptoms were likely secondary to biliary disease and he underwent cholecystectomy on November 24, 2010.  Despite that, he has had persistent abdominal pain.  The patient reports that he has been having intermittent episodes of nausea and vomiting and most notably in the mornings which would improve as the day progressed.  This was going on intermittently for a couple of years now.  He had similar symptoms about a week ago.  He went to [facility]. There he was given some Phenergan and discharged home.  Symptoms got a little bit better over the next few days.  Symptoms returned again, so he comes here to the ED.  He reports some streaks of blood.  So, we were asked to admit him to further evaluate.  Please refer to  dictation from February 16, 2011 for further details.  PHYSICAL EXAMINATION:  VITAL SIGNS:  Temperature 98, pulse 60, blood pressure 154/80, respirations 20, and he was saturating 79% on room air. GENERAL:  Well-built male, lying in bed in no acute distress. HEENT:  Normocephalic and atraumatic. NECK:  Supple.  No JVD.  Moist mucous membrane. CARDIOVASCULAR:  Regular rate and rhythm with positive S1-S2. LUNGS:  Good air movement.  Clear to auscultation. ABDOMEN:  Positive bowel sounds, nontender, and nondistended. EXTREMITIES:  No clubbing, cyanosis, or edema. NEUROLOGIC:  Nonfocal.  ADMISSION LABORATORY DATA:  White count of 10, hemoglobin of 12, and platelet count 222.  Sodium 142, potassium 3.7, chloride 105, bicarb of 27, BUN of 12, and creatinine 0.8.  LFTs with normal limits.  Calcium 9.3.  Lipase 26.  KUB as above.  ASSESSMENT AND PLAN: 1. 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  He was put n.p.o.     He was given narcotics.  The narcotics were stopped by the next     day.  Gastric emptying study was done.  It showed delay but he was     on narcotics.  Gastroenterology was consulted, recommended to  follow up as an outpatient and no further narcotics.  His LFTs     remained within normal limits.  He was given food.  He tolerated     this well, so he was discharged in stable condition. 2. Polysubstance abuse.  Counseling was done and he was positive for     marijuana.  DISCHARGE VITAL SIGNS:  Temperature 98, pulse of 55, respirations 16, blood pressure 114/64, and he was saturating 98% on room air.  DISCHARGE LABORATORY DATA:  Sodium 141, potassium 3.6, chloride 107, bicarb 27, glucose of 90, BUN of 9, and creatinine 0.8.  Bilirubin 1.2, alkaline phosphatase 57, AST 14, ALT 11, total protein 6.0, albumin 3.0, and calcium 9.8.  DISPOSITION:  The patient will follow up with GI in 2  weeks.     Marinda Elk, M.D.     AF/MEDQ  D:  02/18/2011  T:  02/18/2011  Job:  [id number]  Electronically Signed by Marinda Elk M.D. on 02/20/2011 09:35:20 AM

## 2011-03-01 ENCOUNTER — Emergency Department (HOSPITAL_COMMUNITY)
Admission: EM | Admit: 2011-03-01 | Discharge: 2011-03-01 | Disposition: A | Discharge status: Home / Self Care | Payer: Self-pay | Attending: Emergency Medicine | Admitting: Emergency Medicine

## 2011-03-01 DIAGNOSIS — I498 Other specified cardiac arrhythmias: Secondary | ICD-10-CM | POA: Insufficient documentation

## 2011-03-01 DIAGNOSIS — R112 Nausea with vomiting, unspecified: Secondary | ICD-10-CM | POA: Insufficient documentation

## 2011-03-01 DIAGNOSIS — R10819 Abdominal tenderness, unspecified site: Secondary | ICD-10-CM | POA: Insufficient documentation

## 2011-03-01 DIAGNOSIS — R109 Unspecified abdominal pain: Secondary | ICD-10-CM | POA: Insufficient documentation

## 2011-03-01 DIAGNOSIS — K219 Gastro-esophageal reflux disease without esophagitis: Secondary | ICD-10-CM | POA: Insufficient documentation

## 2011-03-01 LAB — POCT I-STAT, CHEM 8
BUN: 7 mg/dL (ref 6–23)
Calcium, Ion: 1.05 mmol/L — ABNORMAL LOW (ref 1.12–1.32)
Chloride: 110 mEq/L (ref 96–112)
Creatinine, Ser: 0.9 mg/dL (ref 0.50–1.35)
Glucose, Bld: 137 mg/dL — ABNORMAL HIGH (ref 70–99)
HCT: 43 % (ref 39.0–52.0)
Hemoglobin: 14.6 g/dL (ref 13.0–17.0)
Potassium: 4.4 mEq/L (ref 3.5–5.1)
Sodium: 141 mEq/L (ref 135–145)
TCO2: 21 mmol/L (ref 0–100)

## 2011-03-02 ENCOUNTER — Encounter (HOSPITAL_COMMUNITY): Payer: Self-pay | Admitting: Radiology

## 2011-03-02 ENCOUNTER — Emergency Department (HOSPITAL_COMMUNITY)
Admission: EM | Admit: 2011-03-02 | Discharge: 2011-03-02 | Disposition: A | Discharge status: Home / Self Care | Payer: Self-pay | Attending: Emergency Medicine | Admitting: Emergency Medicine

## 2011-03-02 ENCOUNTER — Emergency Department (HOSPITAL_COMMUNITY): Payer: Self-pay

## 2011-03-02 DIAGNOSIS — K219 Gastro-esophageal reflux disease without esophagitis: Secondary | ICD-10-CM | POA: Insufficient documentation

## 2011-03-02 DIAGNOSIS — R112 Nausea with vomiting, unspecified: Secondary | ICD-10-CM | POA: Insufficient documentation

## 2011-03-02 DIAGNOSIS — R109 Unspecified abdominal pain: Secondary | ICD-10-CM | POA: Insufficient documentation

## 2011-03-02 DIAGNOSIS — Z79899 Other long term (current) drug therapy: Secondary | ICD-10-CM | POA: Insufficient documentation

## 2011-03-02 LAB — BASIC METABOLIC PANEL
BUN: 10 mg/dL (ref 6–23)
CO2: 27 mEq/L (ref 19–32)
Calcium: 9.6 mg/dL (ref 8.4–10.5)
Chloride: 101 mEq/L (ref 96–112)
Creatinine, Ser: 0.76 mg/dL (ref 0.50–1.35)
GFR calc Af Amer: >60 mL/min (ref >60)
GFR calc non Af Amer: >60 mL/min (ref >60)
Glucose, Bld: 117 mg/dL — ABNORMAL HIGH (ref 70–99)
Potassium: 3.6 mEq/L (ref 3.5–5.1)
Sodium: 138 mEq/L (ref 135–145)

## 2011-03-02 LAB — DIFFERENTIAL
Basophils Absolute: 0 10*3/uL (ref 0.0–0.1)
Basophils Relative: 0 % (ref 0–1)
Eosinophils Absolute: 0 10*3/uL (ref 0.0–0.7)
Eosinophils Relative: 0 % (ref 0–5)
Lymphocytes Relative: 10 % — ABNORMAL LOW (ref 12–46)
Lymphs Abs: 1.3 10*3/uL (ref 0.7–4.0)
Monocytes Absolute: 1.1 10*3/uL — ABNORMAL HIGH (ref 0.1–1.0)
Monocytes Relative: 8 % (ref 3–12)
Neutro Abs: 11.7 10*3/uL — ABNORMAL HIGH (ref 1.7–7.7)
Neutrophils Relative %: 82 % — ABNORMAL HIGH (ref 43–77)

## 2011-03-02 LAB — CBC
HCT: 40.7 % (ref 39.0–52.0)
Hemoglobin: 13.9 g/dL (ref 13.0–17.0)
MCH: 32.2 pg (ref 26.0–34.0)
MCHC: 34.2 g/dL (ref 30.0–36.0)
MCV: 94.2 fL (ref 78.0–100.0)
Platelets: 214 10*3/uL (ref 150–400)
RBC: 4.32 MIL/uL (ref 4.22–5.81)
RDW: 12.8 % (ref 11.5–15.5)
WBC: 14.2 10*3/uL — ABNORMAL HIGH (ref 4.0–10.5)

## 2011-03-02 LAB — URINALYSIS, ROUTINE W REFLEX MICROSCOPIC
Glucose, UA: NEGATIVE mg/dL
Hgb urine dipstick: NEGATIVE
Ketones, ur: 15 mg/dL — AB
Nitrite: NEGATIVE
Protein, ur: NEGATIVE mg/dL
Specific Gravity, Urine: 1.028 (ref 1.005–1.030)
Urobilinogen, UA: 1 mg/dL (ref 0.0–1.0)
pH: 6 (ref 5.0–8.0)

## 2011-03-02 LAB — URINE MICROSCOPIC-ADD ON

## 2011-03-02 LAB — HEPATIC FUNCTION PANEL
ALT: 18 U/L (ref 0–53)
AST: 18 U/L (ref 0–37)
Albumin: 4 g/dL (ref 3.5–5.2)
Alkaline Phosphatase: 66 U/L (ref 39–117)
Bilirubin, Direct: 0.3 mg/dL (ref 0.0–0.3)
Indirect Bilirubin: 2.1 mg/dL — ABNORMAL HIGH (ref 0.3–0.9)
Total Bilirubin: 2.4 mg/dL — ABNORMAL HIGH (ref 0.3–1.2)
Total Protein: 7.5 g/dL (ref 6.0–8.3)

## 2011-03-02 LAB — LIPASE, BLOOD: Lipase: 14 U/L (ref 11–59)

## 2011-03-02 LAB — LACTIC ACID, PLASMA: Lactic Acid, Venous: 1.6 mmol/L (ref 0.5–2.2)

## 2011-03-02 MED ORDER — IOHEXOL 300 MG/ML  SOLN
100.0000 mL | Freq: Once | INTRAMUSCULAR | Status: AC | PRN
  Administered 2011-03-02: 100 mL via INTRAVENOUS

## 2011-03-03 ENCOUNTER — Emergency Department (HOSPITAL_COMMUNITY): Payer: Self-pay

## 2011-03-03 ENCOUNTER — Emergency Department (HOSPITAL_COMMUNITY)
Admission: EM | Admit: 2011-03-03 | Discharge: 2011-03-03 | Disposition: A | Discharge status: Home / Self Care | Payer: Self-pay | Attending: Emergency Medicine | Admitting: Emergency Medicine

## 2011-03-03 DIAGNOSIS — R1013 Epigastric pain: Secondary | ICD-10-CM | POA: Insufficient documentation

## 2011-03-03 DIAGNOSIS — G8929 Other chronic pain: Secondary | ICD-10-CM | POA: Insufficient documentation

## 2011-03-03 DIAGNOSIS — Z79899 Other long term (current) drug therapy: Secondary | ICD-10-CM | POA: Insufficient documentation

## 2011-03-03 DIAGNOSIS — K219 Gastro-esophageal reflux disease without esophagitis: Secondary | ICD-10-CM | POA: Insufficient documentation

## 2011-03-03 DIAGNOSIS — R112 Nausea with vomiting, unspecified: Secondary | ICD-10-CM | POA: Insufficient documentation

## 2011-03-03 LAB — COMPREHENSIVE METABOLIC PANEL
ALT: 17 U/L (ref 0–53)
AST: 20 U/L (ref 0–37)
Albumin: 4 g/dL (ref 3.5–5.2)
Alkaline Phosphatase: 66 U/L (ref 39–117)
BUN: 12 mg/dL (ref 6–23)
CO2: 25 mEq/L (ref 19–32)
Calcium: 9.7 mg/dL (ref 8.4–10.5)
Chloride: 102 mEq/L (ref 96–112)
Creatinine, Ser: 0.74 mg/dL (ref 0.50–1.35)
GFR calc Af Amer: >60 mL/min (ref >60)
GFR calc non Af Amer: >60 mL/min (ref >60)
Glucose, Bld: 98 mg/dL (ref 70–99)
Potassium: 3.5 mEq/L (ref 3.5–5.1)
Sodium: 140 mEq/L (ref 135–145)
Total Bilirubin: 3 mg/dL — ABNORMAL HIGH (ref 0.3–1.2)
Total Protein: 7.4 g/dL (ref 6.0–8.3)

## 2011-03-03 LAB — DIFFERENTIAL
Basophils Absolute: 0 10*3/uL (ref 0.0–0.1)
Basophils Relative: 0 % (ref 0–1)
Eosinophils Absolute: 0 10*3/uL (ref 0.0–0.7)
Eosinophils Relative: 0 % (ref 0–5)
Lymphocytes Relative: 21 % (ref 12–46)
Lymphs Abs: 2.3 10*3/uL (ref 0.7–4.0)
Monocytes Absolute: 1 10*3/uL (ref 0.1–1.0)
Monocytes Relative: 9 % (ref 3–12)
Neutro Abs: 7.3 10*3/uL (ref 1.7–7.7)
Neutrophils Relative %: 69 % (ref 43–77)

## 2011-03-03 LAB — CBC
HCT: 39.2 % (ref 39.0–52.0)
Hemoglobin: 13.4 g/dL (ref 13.0–17.0)
MCH: 32.2 pg (ref 26.0–34.0)
MCHC: 34.2 g/dL (ref 30.0–36.0)
MCV: 94.2 fL (ref 78.0–100.0)
Platelets: 212 10*3/uL (ref 150–400)
RBC: 4.16 MIL/uL — ABNORMAL LOW (ref 4.22–5.81)
RDW: 12.9 % (ref 11.5–15.5)
WBC: 10.6 10*3/uL — ABNORMAL HIGH (ref 4.0–10.5)

## 2011-03-03 LAB — LIPASE, BLOOD: Lipase: 18 U/L (ref 11–59)

## 2011-03-04 ENCOUNTER — Inpatient Hospital Stay (HOSPITAL_COMMUNITY)
Admission: EM | Admit: 2011-03-04 | Discharge: 2011-03-07 | DRG: 392 | Disposition: A | Discharge status: Home / Self Care | Payer: Self-pay | Attending: Internal Medicine | Admitting: Internal Medicine

## 2011-03-04 DIAGNOSIS — R112 Nausea with vomiting, unspecified: Principal | ICD-10-CM | POA: Diagnosis present

## 2011-03-04 DIAGNOSIS — E871 Hypo-osmolality and hyponatremia: Secondary | ICD-10-CM | POA: Diagnosis present

## 2011-03-04 DIAGNOSIS — K3184 Gastroparesis: Secondary | ICD-10-CM | POA: Diagnosis present

## 2011-03-04 DIAGNOSIS — E876 Hypokalemia: Secondary | ICD-10-CM | POA: Diagnosis present

## 2011-03-04 DIAGNOSIS — R109 Unspecified abdominal pain: Secondary | ICD-10-CM | POA: Diagnosis present

## 2011-03-04 DIAGNOSIS — F1210 Cannabis abuse, uncomplicated: Secondary | ICD-10-CM | POA: Diagnosis present

## 2011-03-04 DIAGNOSIS — F4542 Pain disorder with related psychological factors: Secondary | ICD-10-CM | POA: Diagnosis present

## 2011-03-04 DIAGNOSIS — G894 Chronic pain syndrome: Secondary | ICD-10-CM | POA: Diagnosis present

## 2011-03-04 LAB — CBC
HCT: 38.8 % — ABNORMAL LOW (ref 39.0–52.0)
Hemoglobin: 13.8 g/dL (ref 13.0–17.0)
MCH: 32.9 pg (ref 26.0–34.0)
MCHC: 35.6 g/dL (ref 30.0–36.0)
MCV: 92.6 fL (ref 78.0–100.0)
Platelets: 195 10*3/uL (ref 150–400)
RBC: 4.19 MIL/uL — ABNORMAL LOW (ref 4.22–5.81)
RDW: 12.4 % (ref 11.5–15.5)
WBC: 8.3 10*3/uL (ref 4.0–10.5)

## 2011-03-04 LAB — DIFFERENTIAL
Basophils Absolute: 0 10*3/uL (ref 0.0–0.1)
Basophils Relative: 0 % (ref 0–1)
Eosinophils Absolute: 0 10*3/uL (ref 0.0–0.7)
Eosinophils Relative: 0 % (ref 0–5)
Lymphocytes Relative: 11 % — ABNORMAL LOW (ref 12–46)
Lymphs Abs: 0.9 10*3/uL (ref 0.7–4.0)
Monocytes Absolute: 0.5 10*3/uL (ref 0.1–1.0)
Monocytes Relative: 6 % (ref 3–12)
Neutro Abs: 6.9 10*3/uL (ref 1.7–7.7)
Neutrophils Relative %: 83 % — ABNORMAL HIGH (ref 43–77)

## 2011-03-04 LAB — COMPREHENSIVE METABOLIC PANEL
ALT: 16 U/L (ref 0–53)
AST: 25 U/L (ref 0–37)
Albumin: 4.1 g/dL (ref 3.5–5.2)
Alkaline Phosphatase: 66 U/L (ref 39–117)
BUN: 11 mg/dL (ref 6–23)
CO2: 24 mEq/L (ref 19–32)
Calcium: 9.7 mg/dL (ref 8.4–10.5)
Chloride: 96 mEq/L (ref 96–112)
Creatinine, Ser: 0.55 mg/dL (ref 0.50–1.35)
GFR calc Af Amer: >60 mL/min (ref >60)
GFR calc non Af Amer: >60 mL/min (ref >60)
Glucose, Bld: 104 mg/dL — ABNORMAL HIGH (ref 70–99)
Potassium: 4.1 mEq/L (ref 3.5–5.1)
Sodium: 133 mEq/L — ABNORMAL LOW (ref 135–145)
Total Bilirubin: 3 mg/dL — ABNORMAL HIGH (ref 0.3–1.2)
Total Protein: 7.6 g/dL (ref 6.0–8.3)

## 2011-03-05 LAB — RAPID URINE DRUG SCREEN, HOSP PERFORMED
Amphetamines: NOT DETECTED
Barbiturates: POSITIVE — AB
Benzodiazepines: NOT DETECTED
Cocaine: NOT DETECTED
Opiates: NOT DETECTED
Tetrahydrocannabinol: POSITIVE — AB

## 2011-03-05 LAB — BASIC METABOLIC PANEL
BUN: 9 mg/dL (ref 6–23)
CO2: 26 mEq/L (ref 19–32)
Calcium: 9.7 mg/dL (ref 8.4–10.5)
Chloride: 97 mEq/L (ref 96–112)
Creatinine, Ser: 0.69 mg/dL (ref 0.50–1.35)
GFR calc Af Amer: >60 mL/min (ref >60)
GFR calc non Af Amer: >60 mL/min (ref >60)
Glucose, Bld: 95 mg/dL (ref 70–99)
Potassium: 3.4 mEq/L — ABNORMAL LOW (ref 3.5–5.1)
Sodium: 136 mEq/L (ref 135–145)

## 2011-03-05 LAB — CBC
HCT: 38.1 % — ABNORMAL LOW (ref 39.0–52.0)
Hemoglobin: 13.7 g/dL (ref 13.0–17.0)
MCH: 32.8 pg (ref 26.0–34.0)
MCHC: 36 g/dL (ref 30.0–36.0)
MCV: 91.1 fL (ref 78.0–100.0)
Platelets: 232 10*3/uL (ref 150–400)
RBC: 4.18 MIL/uL — ABNORMAL LOW (ref 4.22–5.81)
RDW: 12.1 % (ref 11.5–15.5)
WBC: 10.2 10*3/uL (ref 4.0–10.5)

## 2011-03-05 LAB — LIPASE, BLOOD: Lipase: 12 U/L (ref 11–59)

## 2011-03-05 LAB — CORTISOL: Cortisol, Plasma: 22.1 ug/dL

## 2011-03-06 LAB — BASIC METABOLIC PANEL
BUN: 9 mg/dL (ref 6–23)
CO2: 26 mEq/L (ref 19–32)
Calcium: 9.8 mg/dL (ref 8.4–10.5)
Chloride: 95 mEq/L — ABNORMAL LOW (ref 96–112)
Creatinine, Ser: 0.65 mg/dL (ref 0.50–1.35)
GFR calc Af Amer: >60 mL/min (ref >60)
GFR calc non Af Amer: >60 mL/min (ref >60)
Glucose, Bld: 91 mg/dL (ref 70–99)
Potassium: 3 mEq/L — ABNORMAL LOW (ref 3.5–5.1)
Sodium: 134 mEq/L — ABNORMAL LOW (ref 135–145)

## 2011-03-06 LAB — CBC
HCT: 39.1 % (ref 39.0–52.0)
Hemoglobin: 13.9 g/dL (ref 13.0–17.0)
MCH: 32.3 pg (ref 26.0–34.0)
MCHC: 35.5 g/dL (ref 30.0–36.0)
MCV: 90.9 fL (ref 78.0–100.0)
Platelets: 221 10*3/uL (ref 150–400)
RBC: 4.3 MIL/uL (ref 4.22–5.81)
RDW: 12 % (ref 11.5–15.5)
WBC: 7.4 10*3/uL (ref 4.0–10.5)

## 2011-03-07 LAB — BASIC METABOLIC PANEL
BUN: 10 mg/dL (ref 6–23)
CO2: 29 mEq/L (ref 19–32)
Calcium: 9.5 mg/dL (ref 8.4–10.5)
Chloride: 101 mEq/L (ref 96–112)
Creatinine, Ser: 0.77 mg/dL (ref 0.50–1.35)
GFR calc Af Amer: >60 mL/min (ref >60)
GFR calc non Af Amer: >60 mL/min (ref >60)
Glucose, Bld: 84 mg/dL (ref 70–99)
Potassium: 3.1 mEq/L — ABNORMAL LOW (ref 3.5–5.1)
Sodium: 139 mEq/L (ref 135–145)

## 2011-03-08 LAB — C1 ESTERASE INHIBITOR: C1INH SerPl-mCnc: 18 mg/dL (ref 11–26)

## 2011-03-09 ENCOUNTER — Emergency Department (HOSPITAL_COMMUNITY)
Admission: EM | Admit: 2011-03-09 | Discharge: 2011-03-09 | Disposition: A | Discharge status: Home / Self Care | Payer: Self-pay | Attending: Emergency Medicine | Admitting: Emergency Medicine

## 2011-03-09 DIAGNOSIS — R109 Unspecified abdominal pain: Secondary | ICD-10-CM | POA: Insufficient documentation

## 2011-03-09 DIAGNOSIS — K219 Gastro-esophageal reflux disease without esophagitis: Secondary | ICD-10-CM | POA: Insufficient documentation

## 2011-03-09 DIAGNOSIS — Z79899 Other long term (current) drug therapy: Secondary | ICD-10-CM | POA: Insufficient documentation

## 2011-03-09 DIAGNOSIS — R112 Nausea with vomiting, unspecified: Secondary | ICD-10-CM | POA: Insufficient documentation

## 2011-03-09 LAB — POCT I-STAT, CHEM 8
BUN: 6 mg/dL (ref 6–23)
Calcium, Ion: 1.22 mmol/L (ref 1.12–1.32)
Chloride: 101 mEq/L (ref 96–112)
Creatinine, Ser: 0.8 mg/dL (ref 0.50–1.35)
Glucose, Bld: 144 mg/dL — ABNORMAL HIGH (ref 70–99)
HCT: 43 % (ref 39.0–52.0)
Hemoglobin: 14.6 g/dL (ref 13.0–17.0)
Potassium: 3.9 mEq/L (ref 3.5–5.1)
Sodium: 139 mEq/L (ref 135–145)
TCO2: 27 mmol/L (ref 0–100)

## 2011-03-09 NOTE — H&P (Signed)
NAME:  Matthew Scott, COGGESHALL NO.:  [id number]  MEDICAL RECORD NO.:  [id number]  LOCATION:                                 FACILITY:  PHYSICIAN:  Andreas Blower, MD       DATE OF BIRTH:  13-Aug-1981  DATE OF ADMISSION: DATE OF DISCHARGE:                             HISTORY & PHYSICAL   PRIMARY CARE PHYSICIAN:  [facility].  PRIMARY GASTROENTEROLOGIST:  Willis Modena, MD  CHIEF COMPLAINT:  Nausea, vomiting, abdominal pain.  HISTORY OF PRESENT ILLNESS:  Mr. Matthew Scott is a 30 year old African American male with history of nausea, vomiting, abdominal pain.  He was most recently hospitalized on February 16, 2011, through February 18, 2011.  The patient's nausea, vomiting, and abdominal pain were thought to be secondary to psychosomatic versus possible narcotic bowel syndrome versus gastroparesis.  The patient after being discharged filled his nausea medications but has not taken them.  Since March 01, 2011, the patient has had multiple ER visits with complaints of nausea, vomiting, and abdominal pain.  As a result, the hospitalist service was asked to admit the patient for further management.  The patient reports that since being discharged he continues to have generalized abdominal pain and has vomited numerous times during the day.  He does report that he has noted some streaks of blood.  Does complain of diarrhea that is watery in nature.  Denies any headaches or vision changes.  Does complain of chest pain and shortness of breath with vomiting.  Given that the today's visit was the fourth visit, the ER has asked the hospitalist service to admit the patient.  REVIEW OF SYSTEMS:  All systems were reviewed with the patient and was positive as per HPI, otherwise, all other systems are negative.  RADIOLOGY/IMAGING:  The patient had a CT of the abdomen and pelvis with contrast on March 02, 2011, which showed status post cholecystectomy.  No intrahepatic or extrahepatic ductal  dilatation.  No evidence of bowel obstruction.  Normal appendix.  No CT findings to account for the patient's abdominal symptoms. The patient had abdominal series on March 03, 2011, which shows no active lung disease.  Slight prominent small bowel loops in the right abdomen.  PAST MEDICAL HISTORY: 1. History of nausea, vomiting, abdominal pain, thought to be     psychosomatic versus narcotic bowel syndrome versus gastroparesis. 2. History of polysubstance abuse. 3. History of H. pylori infection. 4. History of cholecystectomy in April 2011.  SOCIAL HISTORY:  The patient reports that he is smoking and last smoked cigarettes 2 days ago and denies any illegal drugs or substances.  Does not drink any alcohol.  FAMILY HISTORY:  Significant for father having pancreatitis.  Mother is, otherwise, in good health.  HOME MEDICATIONS: 1. Promethazine 25 mg p.o. three times a day as needed for nausea,     vomiting. 2. Ondansetron 4 mg every 8 hours as needed for nausea, vomiting.  PHYSICAL EXAMINATION:  VITAL SIGNS:  Temperature 99.6, initially was 100.3; blood pressure was 144/70; heart rate 73; respirations 18; satting at 99% on room air. GENERAL:  The patient is alert, oriented, did not appear to be in acute distress, was  lying in bed comfortably. HEENT:  Extraocular motions are intact.  Pupils equal, round.  Had moist mucous membranes. NECK:  Supple. HEART:  Regular with S1 and S2. LUNGS:  Clear to auscultation bilaterally. ABDOMEN:  Soft.  The patient complained of generalized tenderness. Positive bowel sounds.  No guarding or rebound tenderness. EXTREMITIES:  The patient has good peripheral pulses with trace edema. NEUROLOGIC:  Cranial nerves II through XII grossly intact, 5/5 motor strength in the upper as well as lower extremities.  LABORATORY DATA:  CBC shows a white count of 8.3, hemoglobin 13.8, hematocrit 38.8, platelet count 195.  Electrolytes normal except sodium is 133,  BUN is 11, creatinine 0.55.  Liver function tests normal except total bilirubin is 3.0.  UA was negative for nitrites and trace leukocytes.  ASSESSMENT AND PLAN: 1. Nausea, vomiting, and generalized abdominal pain, etiology unclear.     The patient has had extensive workup in the past and had gastric     emptying study most recently on February 17, 2011, which showed gastric     emptying is dilated.  We will admit the patient and gently hydrate     the patient on IV fluids as the patient appears dehydrated due to     nausea, vomiting.  We will start the patient on clear liquid diet     and will advance the patient's diet to low-residue diet as     tolerated.  If the patient continues to have nausea, vomiting, and     generalized abdominal pain, we could consider getting a GI     consultation in the morning. 2. Hyponatremia, likely due to nausea and vomiting.  Monitor for now. 3. Elevated total bili, likely due to cholecystectomy.  Monitor for     now. 4. History of polysubstance abuse.  We will send for urine drug screen     to see if urine drug screen tests positive for any substances that     could be triggering his nausea and vomiting.  It is documented that     the patient did receive one dose of Dilaudid on March 02, 2011. 5. Prophylaxis.  Lovenox for deep venous thrombosis prophylaxis. 6. Code status.  The patient is full code.  Time spent on admission talking to the patient and family and coordinating care was 45 minutes.     Andreas Blower, MD     SR/MEDQ  D:  03/04/2011  T:  03/04/2011  Job:  [id number]  Electronically Signed by Wardell Heath Bari Handshoe  on 03/09/2011 07:54:49 AM

## 2011-03-09 NOTE — Discharge Summary (Signed)
NAMEPAGE, LANCON               ACCOUNT NO.:  [id number]  MEDICAL RECORD NO.:  [id number]  LOCATION:  5156                         FACILITY:  [facility]  PHYSICIAN:  Thad Ranger, MD       DATE OF BIRTH:  [birth year]  DATE OF ADMISSION:  03/04/2011 DATE OF DISCHARGE:  03/04/2011                        DISCHARGE SUMMARY - REFERRING   PRIMARY CARE PHYSICIAN:  [facility].  DISCHARGE DIAGNOSES: 1. Nausea, vomiting, and abdominal pain, gastroparesis versus     psychosomatic. 2. Polysubstance abuse. 3. Hypokalemia, replaced.  CONSULTATIONS:  Gastroenterology, Dr. Dulce Sellar.  DISCHARGE MEDICATIONS: 1. Doxepin 10 mg p.o. b.i.d. 2. Tramadol 100 mg p.o. t.i.d. as needed for pain. 3. Promethazine 25 mg p.o. t.i.d. as needed for nausea/vomiting. 4. Zofran 4 mg p.o. every hours as needed for nausea/vomiting.  HISTORY OF PRESENT ILLNESS AT THE TIME OF ADMISSION:  Mr. Deskin is a 30- year-old male who presented with nausea, vomiting, and abdominal pain. The patient was most recently hospitalized from February 16, 2011, and through February 18, 2011, since March 01, 2011.  The patient also had multiple ER visits with complaints of nausea, vomiting, and abdominal pain.  As a result, the Hospitalist Service was requested to admit the patient for further management.  PERTINENT RADIOLOGICAL DATA:  CT of abdomen and pelvis with contrast on March 02, 2011, status post cholecystectomy.  No intrahepatic or extrahepatic duct dilatation.  No evidence of bowel obstruction.  Normal appendix.  No CT findings to confirm the patient's abdominal symptoms. Acute abdominal series on March 03, 2011, showed no active lung disease, slightly prominent small bowel loops in the right abdomen of the questionable significance.  No bowel obstruction.  No free air.  BRIEF HOSPITALIZATION COURSE:  Mr. Tan is a 30 year old male who presented with intermittent abdominal pain, nausea, and vomiting symptoms and was admitted for  further evaluation given his multiple ER visits since March 01, 2011. 1. Nausea, vomiting, and abdominal pain, possibly secondary to     gastroparesis versus psychosomatic.  The patient was admitted to     the medical floor.  He was placed on the supportive management with     IV fluids and antiemetics.  For the pain, he was given tramadol     only.  The patient did not receive any narcotics during the     hospitalization.  He has had extensive GI evaluation and studies in     the recent past.  GI was consulted and did recommend a screening     for porphyria and Addison disease.  Cortisol level was normal.  He     was eventually started on doxepin by Dr. Herbie Drape.  Porphyria     screening tests are still pending for results.  Urine drug screen     was also checked, which showed barbiturates and marijuana, which     could have triggered the patient's symptoms.  Per Dr. Hulen Shouts     recommendation if the porphyria studies are negative, then he may     ultimately benefit from tertiary center evaluation as an outpatient     or psychiatry consultation.  At the time of the dictation, the     patient's  abdominal pain has significantly improved.  He is not     complaining of any nausea, vomiting, and ate full of his breakfast,     regular diet. 2. Mild hyponatremia with hypokalemia, likely due to nausea and     vomiting.  Potassium was replaced during the hospitalization. 3. History of polysubstance abuse.  Urine drug screen was positive for     barbiturates and THC.  The patient was strongly counseled on being     compliant and to avoid these recreational drugs.  The patient will     be discharged home today after the appointment with [facility] and     Gastroenterology, Dr. Dulce Sellar is made by the case manager.  The     patient has history of noncompliance and has not previously     followed by Gastroenterology even though he has been seen by GI     inpatient multiple times.  VITAL SIGNS:  At  the time of discharge, temp 98.6, pulse 58, respirations 17, BP 132/76, and O2 sats 98% on room air. GENERAL:  The patient is alert, awake, and oriented x3, not in acute distress. HEENT:  Anicteric sclerae.  Pink conjunctivae.  Pupils are reactive to light and accommodation.  EOMI. NECK:  Supple.  No lymphopathy.  No JVD. CARDIOVASCULAR:  S1 and S2 clear. CHEST:  Clear to auscultation bilaterally. ABDOMEN:  Soft, nontender, and nondistended.  Normal bowel sounds. EXTREMITIES:  No cyanosis, clubbing, or edema noted in the upper or lower extremities bilaterally.  DISCHARGE FOLLOWUP:  With [facility] and Dr. Dulce Sellar within next 7-10 days.  The followup appointments will be made by the Case Management prior to the patient's discharge from the hospital.  The patient was strongly counseled to keep his appointments given his previous history of noncompliance.  DISCHARGE TIME:  35 minutes.     Thad Ranger, MD     RR/MEDQ  D:  03/07/2011  T:  03/07/2011  Job:  [id number]  cc:   [facility] Willis Modena, MD  Electronically Signed by Andres Labrum Latashia Koch  on 03/09/2011 01:01:11 PM

## 2011-03-16 LAB — MISCELLANEOUS TEST

## 2011-03-17 NOTE — H&P (Signed)
NAMEMarland Kitchen  PARTHIV, MUCCI NO.:  [id number]  MEDICAL RECORD NO.:  [id number]  LOCATION:  [facility]                         FACILITY:  [facility]  PHYSICIAN:  Zannie Cove, MD     DATE OF BIRTH:  10-19-1980  DATE OF ADMISSION:  02/16/2011 DATE OF DISCHARGE:                             HISTORY & PHYSICAL   PRIMARY CARE PHYSICIAN:  [facility].  GASTROENTEROLOGIST:  Willis Modena, MD at [facility].  CHIEF COMPLAINT:  Nausea, vomiting, and abdominal pain.  HISTORY OF PRESENT ILLNESS:  Mr. Carchi is a 30 year old African- American gentleman who has had multiple admissions to the hospital due to nausea, vomiting, and abdominal pain.  At one point in August 2001, he was found to have biliary dyskinesia and hence it was felt that his symptoms were likely secondary to gallbladder disease.  Subsequently underwent a cholecystectomy back in April 2011.  Despite that, his symptoms have persisted.  The patient reports that he started having intermittent flares of nausea and vomiting, most notably worse in the mornings, which would improve as the day progresses.  This was going on intermittently for couple of years now.  He had similar symptoms about a week ago.  He went to [facility].  There he was given some fluids and Phenergan and discharged home.  Symptoms got a little better for the next 2 days and then worsened again.  This morning, he had couple of episodes of vomiting associated with nausea and 2 episodes of diarrhea.  He reports trace streaks of blood in his vomitus and describes his abdominal pain as 7/10 in intensity.  He describes it as intermittently sharp in nature, nonradiating without any alleviating factors.  He says that occasionally when he lays down on the right side, his pain gets a little worse and hence he avoids disposition.  No association with his food or bowel movements, etc.  He denies any fevers or chills.  Denies taking any other medicines.   Denies alcohol use. Reports using marijuana on a daily basis.  No history of sick contacts.  PAST MEDICAL HISTORY:  Significant for, 1. Chronic nausea and vomiting of unknown etiology, possible IBS. 2. History of polysubstance abuse. 3. History of H. pylori infection. 4. History of cholecystectomy in April 2011.  SOCIAL HISTORY:  He is single, lives at home with his mother.  He has never really had a job.  He uses marijuana on a baby daily basis. Denies any history of tobacco or alcohol use and denies any other illicit drug use.  FAMILY HISTORY:  Pancreas problems in his dad and diabetes.  His mother is reportedly healthy.  MEDICATIONS:  The patient reports only taking Phenergan since the last week on a p.r.n. basis.  ALLERGIES:  No known drug allergies.  REVIEW OF SYSTEMS:  Negative except per HPI.  PHYSICAL EXAMINATION:  VITAL SIGNS:  Temperature is 98.3, pulse is 60, blood pressure is 154/80, respirations are 20, satting 99% on room air. GENERAL:  He is a well-built African-American gentleman lying in the stretcher, in no acute distress. HEENT:  Anicteric.  No pallor.  Oral mucosa is moist. NECK:  No JVD or lymphadenopathy. CARDIOVASCULAR SYSTEM:  S1 and S2.  Regular rate and rhythm. LUNGS:  Clear to auscultation bilaterally. ABDOMEN:  Soft and nontender with normal bowel sounds.  No guarding or rigidity.  No flank tenderness. EXTREMITIES:  No edema, clubbing, or cyanosis. NEURO:  Moves all extremities.  No localizing signs.  LABORATORY DATA:  Laboratory data shows white count of 10.8, hemoglobin 12.8, platelets 228.  Chemistries, sodium 142, potassium 3.7, chloride 105, bicarb 27, BUN 12, creatinine 0.8.  LFTs normal.  Calcium 9.3, lipase 26. KUB shows no interval change or acute abnormalities, status post cholecystectomy.  ASSESSMENT/PLAN:  This is a 30 year old gentleman with, 1. Nausea and vomiting. 2. Abdominal pain of unclear etiology. 3. History of  polysubstance abuse.    The etiology and differential in this gentleman is pretty extensive.  At this point, it is unclear     if he probably could have irritable bowel syndrome or whether he     has narcotic bowel syndrome or whether he has     gastroparesis for some unknown etiology.  He does report that he     vomited.  The contents of his vomitus this morning included     substances that he had eaten last night for dinner, which indicates     a significant delay in gastric emptying and hence we will check a     gastric emptying scan.  We also check urine toxicology, urinalysis,     and urine for porphyrinogen. If his workup was negative, will     likely need follow up with GI for possible management of IBS.  In     the meantime, we will keep him on a proton pump inhibitor and also     advised regarding avoidance of marijuana.  We will hold off in     using Reglan until his gastric emptying study is done and as of now     start him on a clear diet.  Hopefully, advance diet if symptoms     improved and discharged soon to follow up with GI.     Zannie Cove, MD     PJ/MEDQ  D:  02/16/2011  T:  02/16/2011  Job:  [id number]  cc:   Willis Modena, MD  Electronically Signed by Zannie Cove  on 03/17/2011 04:15:42 PM

## 2011-03-18 LAB — PORPHYRINS, FRACTIONATED URINE (TIMED COLLECTION)

## 2011-03-25 NOTE — Consult Note (Signed)
NAMEMarland Kitchen  [name], [name] NO.:  [id number]  MEDICAL RECORD NO.:  [id number]  LOCATION:                                 FACILITY:  PHYSICIAN:  Matthew Modena, MD     DATE OF BIRTH:  [birth year]  DATE OF CONSULTATION:  03/05/2011 DATE OF DISCHARGE:                                CONSULTATION   REASON FOR CONSULTATION:  Abdominal pain, nausea, vomiting.  CONSULTING PHYSICIAN:  Matthew Rai, MD  CHIEF COMPLAINT:  Abdominal pain.  HISTORY OF PRESENT ILLNESS:  Matthew Scott is a 30 year old gentleman, presenting for evaluation of abdominal pain, nausea, and vomiting.  He has had multiple admissions for this presentation.  His main complaint at this time is diffuse abdominal pain which causes secondary nausea, vomiting.  He tells me that if he had no abdominal pain, he would not be nauseated.  His symptoms are intermittent, and he describes periods where he his symptom free.  As mentioned, he has had multiple admissions for this complaint and he has had an extensive evaluation.  This includes an abdominal ultrasound which was normal followed by a HIDA scan which showed biliary dyskinesia.  This was subsequently followed by a cholecystectomy which did not improve his symptoms.  He next had an upper endoscopy that showed Helicobacter pylori gastritis, but was otherwise normal.  Treatment for his Helicobacter pylori did not improve his symptoms.  He had an MRI of his brain in May 2011, which showed no intracranial pathology.  He has had multiple CT scans, the last on March 02, 2011, which was negative.  He had gastric emptying study that showed slightly delayed gastric emptying.  Past medical history, past surgical history, home medications, allergies, family history, social history, review of systems, all from dictated note by Matthew Scott, dated March 04, 2011.  I have reviewed and I agree.  PHYSICAL EXAMINATION:  VITAL SIGNS:  Blood pressure 162/91, heart rate 53,  respiratory rate 18, temperature 98.9, oxygen saturation 99% room air. GENERAL:  Matthew Scott is in no acute distress, appears somewhat uncomfortable, but is nontoxic appearing. HEENT:  Slightly dry mucous membranes.  No oropharyngeal lesions.  Eyes, sclerae slightly icteric.  Conjunctivae pink. NECK:  Supple. LUNGS:  Clear. HEART:  Regular. ABDOMEN:  Soft but he has generalized diffuse abdominal pain which decreases with distraction maneuvers.  Bowel sounds are present. EXTREMITIES:  No peripheral cyanosis, clubbing, or edema. NEUROLOGIC:  Nonfocal. SKIN:  No rash or ecchymoses. PSYCHIATRIC:  Depressed mood, flat affect. LYMPHATICS:  No palpable axillary, submandibular, or supraclavicular adenopathy.  LABORATORY STUDIES:  Hemoglobin is 13.7, white count was 14.2 on admission, currently 10.2, platelet count is 232.  Sodium 136, potassium 3.4, chloride 97, bicarb 26, BUN 9, creatinine 0.7.  Bilirubin most recent was 2.4 with an indirect fraction of 2.1.  Other liver tests are normal.  Lactic acid couple of days ago was normal.  Lipase normal. Urine drug screen negative for opiates, cocaine, or amphetamines. Urinalysis, borderline elevated urine specific gravity of 1.028 with a small amount of ketones.  RADIOLOGIC STUDIES:  Abdominal x-ray done couple of days ago shows no bowel obstruction, no free air.  Her CT scan  on March 02, 2011, as mentioned, showed cholecystectomy anatomy, no biliary dilatation, no bowel obstruction, normal appendix, no inflammatory changes.  IMPRESSION:  Matthew Scott is a 30 year old gentleman who has had over a year of intermittent abdominal pain, nausea, vomiting symptoms.  His symptoms have persisted despite cholecystectomy and treatment for Helicobacter pylori gastritis.  Cross-sectional imaging study showed no inflammatory changes.  No evidence of bowel obstruction.  MRI studies have shown no evidence of central nervous system pathology.   Gastric emptying study shows slightly delayed gastric emptying, however, his main symptom is of pain and the vomiting only occurs in the setting of pain.  This constellation of symptoms does not entertain typical of gastroparesis and I question whether his gastric emptying study is clinically relevant to his symptomatology.  Differential considerations at this point are still rather broad but one would consider functional nausea, vomiting, abdominal pain, as well as other rare entities such as acute intermittent porphyria and adrenal insufficiency (Addison disease).  He has had no recent narcotics, so I doubt in his urine drug tree was fairly normal, so I doubt any component of drug with withdrawal to be causing his set of symptoms.  PLAN: 1. I agree with supportive management with IV fluids and antiemetics. 2. Would markedly limit his administration of narcotics. 3. I will screen for porphyria with urine porphobilinogen. 4. Screening of Addison's with a random cortisol level. 5. If these studies are unrevealing, he may ultimately benefit from     [facility] evaluation as an outpatient and might even in the     future, should all above studies as well as [facility]     evaluation be negative, might even ultimately benefit from a     Psychiatry consultation. 6. We will follow along with you.  I appreciate the consult.  Thanks again for allowing me to participate in Matthew Scott' care.     Matthew Modena, MD     WO/MEDQ  D:  03/05/2011  T:  03/05/2011  Job:  [id number]  Electronically Signed by Matthew Scott  on 03/25/2011 07:23:11 PM

## 2011-05-30 LAB — WOUND CULTURE

## 2011-05-31 LAB — COMPREHENSIVE METABOLIC PANEL
ALT: 22
AST: 27
Albumin: 3.8
Alkaline Phosphatase: 69
BUN: 7
CO2: 29
Calcium: 9.6
Chloride: 104
Creatinine, Ser: 0.92
GFR calc Af Amer: >60
GFR calc non Af Amer: >60
Glucose, Bld: 105 — ABNORMAL HIGH
Potassium: 4
Sodium: 139
Total Bilirubin: 1.8 — ABNORMAL HIGH
Total Protein: 7.2

## 2011-05-31 LAB — CBC
HCT: 42
Hemoglobin: 14
MCHC: 33.3
MCV: 94.4
Platelets: 257
RBC: 4.44
RDW: 12.9
WBC: 10.5

## 2011-05-31 LAB — DIFFERENTIAL
Basophils Absolute: 0
Basophils Relative: 0
Eosinophils Absolute: 0
Eosinophils Relative: 0
Lymphocytes Relative: 17
Lymphs Abs: 1.8
Monocytes Absolute: 0.8 — ABNORMAL HIGH
Monocytes Relative: 8
Neutro Abs: 7.9 — ABNORMAL HIGH
Neutrophils Relative %: 75

## 2011-05-31 LAB — RAPID URINE DRUG SCREEN, HOSP PERFORMED
Amphetamines: NOT DETECTED
Barbiturates: NOT DETECTED
Benzodiazepines: NOT DETECTED
Cocaine: NOT DETECTED
Opiates: NOT DETECTED
Tetrahydrocannabinol: POSITIVE — AB

## 2011-05-31 LAB — ETHANOL: Alcohol, Ethyl (B): <5

## 2011-05-31 LAB — LIPASE, BLOOD: Lipase: 16

## 2012-03-08 ENCOUNTER — Emergency Department (HOSPITAL_COMMUNITY)
Admission: EM | Admit: 2012-03-08 | Discharge: 2012-03-08 | Discharge status: Against Medical Advice | Payer: Self-pay | Attending: Emergency Medicine | Admitting: Emergency Medicine

## 2012-03-08 ENCOUNTER — Encounter (HOSPITAL_COMMUNITY): Payer: Self-pay | Admitting: *Deleted

## 2012-03-08 DIAGNOSIS — R300 Dysuria: Secondary | ICD-10-CM | POA: Insufficient documentation

## 2012-03-08 DIAGNOSIS — R319 Hematuria, unspecified: Secondary | ICD-10-CM | POA: Insufficient documentation

## 2012-03-08 LAB — URINALYSIS, ROUTINE W REFLEX MICROSCOPIC
Glucose, UA: NEGATIVE mg/dL
Ketones, ur: >80 mg/dL — AB
Nitrite: POSITIVE — AB
Protein, ur: 100 mg/dL — AB
Specific Gravity, Urine: 1.036 — ABNORMAL HIGH (ref 1.005–1.030)
Urobilinogen, UA: 1 mg/dL (ref 0.0–1.0)
pH: 6 (ref 5.0–8.0)

## 2012-03-08 LAB — URINE MICROSCOPIC-ADD ON

## 2012-03-08 NOTE — ED Notes (Signed)
Called x's 3 without response 

## 2012-03-08 NOTE — ED Notes (Signed)
Bloody urine since this am.  He has no pain in general but has painful urination also

## 2012-05-02 ENCOUNTER — Emergency Department (HOSPITAL_COMMUNITY)
Admission: EM | Admit: 2012-05-02 | Discharge: 2012-05-02 | Disposition: A | Discharge status: Home / Self Care | Payer: Self-pay | Attending: Emergency Medicine | Admitting: Emergency Medicine

## 2012-05-02 ENCOUNTER — Emergency Department (HOSPITAL_COMMUNITY): Payer: Self-pay

## 2012-05-02 ENCOUNTER — Encounter (HOSPITAL_COMMUNITY): Payer: Self-pay | Admitting: *Deleted

## 2012-05-02 DIAGNOSIS — R10819 Abdominal tenderness, unspecified site: Secondary | ICD-10-CM | POA: Insufficient documentation

## 2012-05-02 DIAGNOSIS — R5381 Other malaise: Secondary | ICD-10-CM | POA: Insufficient documentation

## 2012-05-02 DIAGNOSIS — R5383 Other fatigue: Secondary | ICD-10-CM | POA: Insufficient documentation

## 2012-05-02 DIAGNOSIS — R112 Nausea with vomiting, unspecified: Secondary | ICD-10-CM | POA: Insufficient documentation

## 2012-05-02 DIAGNOSIS — R109 Unspecified abdominal pain: Secondary | ICD-10-CM | POA: Insufficient documentation

## 2012-05-02 LAB — URINALYSIS, ROUTINE W REFLEX MICROSCOPIC
Bilirubin Urine: NEGATIVE
Glucose, UA: 500 mg/dL — AB
Hgb urine dipstick: NEGATIVE
Nitrite: NEGATIVE
Protein, ur: NEGATIVE mg/dL
Specific Gravity, Urine: 1.031 — ABNORMAL HIGH (ref 1.005–1.030)
Urobilinogen, UA: 1 mg/dL (ref 0.0–1.0)
pH: 7 (ref 5.0–8.0)

## 2012-05-02 LAB — CBC
HCT: 40.4 % (ref 39.0–52.0)
Hemoglobin: 13.6 g/dL (ref 13.0–17.0)
MCH: 31.3 pg (ref 26.0–34.0)
MCHC: 33.7 g/dL (ref 30.0–36.0)
MCV: 93.1 fL (ref 78.0–100.0)
Platelets: 232 10*3/uL (ref 150–400)
RBC: 4.34 MIL/uL (ref 4.22–5.81)
RDW: 12.4 % (ref 11.5–15.5)
WBC: 10.7 10*3/uL — ABNORMAL HIGH (ref 4.0–10.5)

## 2012-05-02 LAB — HEPATIC FUNCTION PANEL
ALT: 17 U/L (ref 0–53)
AST: 19 U/L (ref 0–37)
Albumin: 4.2 g/dL (ref 3.5–5.2)
Alkaline Phosphatase: 80 U/L (ref 39–117)
Bilirubin, Direct: 0.2 mg/dL (ref 0.0–0.3)
Indirect Bilirubin: 0.8 mg/dL (ref 0.3–0.9)
Total Bilirubin: 1 mg/dL (ref 0.3–1.2)
Total Protein: 7.4 g/dL (ref 6.0–8.3)

## 2012-05-02 LAB — URINE MICROSCOPIC-ADD ON

## 2012-05-02 LAB — BASIC METABOLIC PANEL
BUN: 9 mg/dL (ref 6–23)
CO2: 24 mEq/L (ref 19–32)
Calcium: 9.8 mg/dL (ref 8.4–10.5)
Chloride: 103 mEq/L (ref 96–112)
Creatinine, Ser: 0.86 mg/dL (ref 0.50–1.35)
GFR calc Af Amer: >90 mL/min (ref >90)
GFR calc non Af Amer: >90 mL/min (ref >90)
Glucose, Bld: 168 mg/dL — ABNORMAL HIGH (ref 70–99)
Potassium: 4 mEq/L (ref 3.5–5.1)
Sodium: 140 mEq/L (ref 135–145)

## 2012-05-02 LAB — LIPASE, BLOOD: Lipase: 14 U/L (ref 11–59)

## 2012-05-02 MED ORDER — ONDANSETRON HCL 4 MG/2ML IJ SOLN
4.0000 mg | Freq: Once | INTRAMUSCULAR | Status: AC
  Administered 2012-05-02: 4 mg via INTRAVENOUS
  Filled 2012-05-02: qty 2

## 2012-05-02 MED ORDER — ONDANSETRON 8 MG PO TBDP: 8.0000 mg | ORAL_TABLET | Freq: Three times a day (TID) | ORAL | Status: DC | PRN

## 2012-05-02 MED ORDER — MORPHINE SULFATE 4 MG/ML IJ SOLN
4.0000 mg | Freq: Once | INTRAMUSCULAR | Status: AC
  Administered 2012-05-02: 4 mg via INTRAVENOUS
  Filled 2012-05-02: qty 1

## 2012-05-02 MED ORDER — HYDROCODONE-ACETAMINOPHEN 5-325 MG PO TABS: 1.0000 | ORAL_TABLET | Freq: Four times a day (QID) | ORAL | Status: DC | PRN

## 2012-05-02 MED ORDER — SODIUM CHLORIDE 0.9 % IV BOLUS (SEPSIS)
1000.0000 mL | Freq: Once | INTRAVENOUS | Status: AC
  Administered 2012-05-02: 1000 mL via INTRAVENOUS

## 2012-05-02 MED ORDER — IOHEXOL 300 MG/ML  SOLN
100.0000 mL | Freq: Once | INTRAMUSCULAR | Status: AC | PRN
  Administered 2012-05-02: 100 mL via INTRAVENOUS

## 2012-05-02 MED ORDER — FENTANYL CITRATE 0.05 MG/ML IJ SOLN
50.0000 ug | Freq: Once | INTRAMUSCULAR | Status: AC
  Administered 2012-05-02: 50 ug via INTRAVENOUS
  Filled 2012-05-02: qty 2

## 2012-05-02 MED ORDER — METOCLOPRAMIDE HCL 5 MG/ML IJ SOLN
10.0000 mg | Freq: Once | INTRAMUSCULAR | Status: AC
  Administered 2012-05-02: 10 mg via INTRAVENOUS
  Filled 2012-05-02: qty 2

## 2012-05-02 NOTE — ED Notes (Signed)
Pt comes from home with complaints of nausea and vomiting with increased weakness today.

## 2012-05-02 NOTE — ED Notes (Signed)
Pt has 4 warm blankets

## 2012-05-02 NOTE — Progress Notes (Signed)
Pt confirms no pcp self pay who was previously seen at [facility] he has not obtained a new pcp, obtained his medical records nor checked to see if his medications sent to [facility] referred pt to [phone] [facility] contact line Cm provided pt with written information for self pay pcp, medication and financial resources including pcp recommended by [facility] and [facility] contact information

## 2012-05-02 NOTE — ED Notes (Signed)
Pt vomited approx yellow/bile emesis.

## 2012-05-02 NOTE — ED Notes (Signed)
Rx given x2 Pt ambulating independently w/ steady gait on d/c in no acute distress, A&Ox4. D/c instructions reviewed w/ pt and family - pt and family deny any further questions or concerns at present.  

## 2012-05-02 NOTE — ED Provider Notes (Signed)
History     CSN: [id number] Arrival date & time 05/02/12  1407 First MD Initiated Contact with Patient 05/02/12 1504     Chief Complaint  Patient presents with  . Abdominal Pain    Patient is a 31 y.o. male presenting with abdominal pain. The history is provided by the patient.  Abdominal Pain The primary symptoms of the illness include abdominal pain, nausea and vomiting. The primary symptoms of the illness do not include fever.  The abdominal pain began 6 to 12 hours ago. The abdominal pain is located in the RLQ. The abdominal pain does not radiate.  Vomiting occurs more than 10 times per day. The emesis contains stomach contents (some blood as well).  Additional symptoms associated with the illness include anorexia. Symptoms associated with the illness do not include frequency or back pain. Significant associated medical issues include gallstones. Significant associated medical issues do not include inflammatory bowel disease, diabetes or substance abuse.    Past Medical History  Diagnosis Date  . Asthma     History reviewed. No pertinent past surgical history.  History reviewed. No pertinent family history.  History  Substance Use Topics  . Smoking status: Current Every Day Smoker  . Smokeless tobacco: Not on file  . Alcohol Use: No      Review of Systems  Constitutional: Negative for fever.  Gastrointestinal: Positive for nausea, vomiting, abdominal pain and anorexia.  Genitourinary: Negative for frequency.  Musculoskeletal: Negative for back pain.  All other systems reviewed and are negative.    Allergies  Review of patient's allergies indicates no known allergies.  Home Medications   Current Outpatient Rx  Name Route Sig Dispense Refill  . IBUPROFEN 200 MG PO TABS Oral Take 400 mg by mouth every 6 (six) hours as needed. Pain    . PROMETHAZINE HCL 25 MG PO TABS Oral Take 25 mg by mouth every 6 (six) hours as needed. Nausea      BP 139/77  Pulse 46  Temp  97.6 F (36.4 C) (Oral)  Resp 18  SpO2 99%  Physical Exam  Nursing note and vitals reviewed. Constitutional: He appears well-developed and well-nourished. No distress.  HENT:  Head: Normocephalic and atraumatic.  Right Ear: External ear normal.  Left Ear: External ear normal.  Eyes: Conjunctivae normal are normal. Right eye exhibits no discharge. Left eye exhibits no discharge. No scleral icterus.  Neck: Neck supple. No tracheal deviation present.  Cardiovascular: Normal rate, regular rhythm and intact distal pulses.   Pulmonary/Chest: Effort normal and breath sounds normal. No stridor. No respiratory distress. He has no wheezes. He has no rales.  Abdominal: Soft. Bowel sounds are normal. He exhibits no distension and no mass. There is tenderness in the right upper quadrant and right lower quadrant. There is no rebound and no guarding. No hernia.  Musculoskeletal: He exhibits no edema and no tenderness.  Neurological: He is alert. He has normal strength. No sensory deficit. Cranial nerve deficit:  no gross defecits noted. He exhibits normal muscle tone. He displays no seizure activity. Coordination normal.  Skin: Skin is warm and dry. No rash noted.  Psychiatric: He has a normal mood and affect.    ED Course  Procedures (including critical care time) EKG Rate 52 Nl axis SINUS RHYTHM ~ normal P axis, V-rate 50- 99 PROBABLE LEFT VENTRICULAR HYPERTROPHY ~ R56L/RISIII/S12R56/S3RL & LAA/LAD Early repolarization pattern, st t waves Labs Reviewed  CBC - Abnormal; Notable for the following:    WBC 10.7 (*)  All other components within normal limits  BASIC METABOLIC PANEL - Abnormal; Notable for the following:    Glucose, Bld 168 (*)     All other components within normal limits  URINALYSIS, ROUTINE W REFLEX MICROSCOPIC - Abnormal; Notable for the following:    Specific Gravity, Urine 1.031 (*)     Glucose, UA 500 (*)     Ketones, ur TRACE (*)     Leukocytes, UA SMALL (*)     All  other components within normal limits  LIPASE, BLOOD  HEPATIC FUNCTION PANEL  URINE MICROSCOPIC-ADD ON   Ct Abdomen Pelvis W Contrast  05/02/2012  *RADIOLOGY REPORT*  Clinical Data: Nausea and vomiting with increased weakness.  CT ABDOMEN AND PELVIS WITH CONTRAST  Technique:  Multidetector CT imaging of the abdomen and pelvis was performed following the standard protocol during bolus administration of intravenous contrast.  Contrast: OMNIPAQUE IOHEXOL 300 MG/ML  SOLN  Comparison: 02/20/2011.  Findings: Small amount of free fluid the pelvis.  Source indeterminate.  No discrete bowel inflammatory process is noted. Evaluation of portions of bowel limited by under distension. No inflammation surrounds the appendix.  No free intraperitoneal air.  No focal hepatic, splenic, pancreatic, adrenal or renal lesion. Post cholecystectomy.  No abdominal aortic aneurysm.  No bony destructive lesion.  IMPRESSION: Small amount of free fluid the pelvis.  Source indeterminate. Please see above.   Original Report Authenticated By: Fuller Canada, M.D.      1. Abdominal pain       MDM  Pt without acute findings on CT.  Few WBC in urine of uncertain significance.  UTI unlikely.  No penile discharge.  Will send off urine cult.   Discussed results with patient and family.  He has had episodes like this before.  ?gastroparesis.  Will dc home with outpt gi referral.        Celene Kras, MD 05/02/12 2110

## 2012-05-02 NOTE — ED Notes (Signed)
Verbal report given to Kim RN

## 2012-05-02 NOTE — ED Notes (Signed)
Pt asleep on assessment, must be awoken by physical and verbal stimuli - on awakening pt reports 9/10 abd pain however continues to be drowsy. Discussed w/ pt the need to keep him awake and manage his pain - explained to pt the dangers of over medicating. Pt more awake now and states he needs additional pain medication and nausea medication. Pt's requests made aware to Dr. Roselyn Bering, EDP. Pt and family updated on waiting for CT results.

## 2012-05-03 ENCOUNTER — Inpatient Hospital Stay (HOSPITAL_COMMUNITY)
Admission: EM | Admit: 2012-05-03 | Discharge: 2012-05-06 | DRG: 392 | Disposition: A | Discharge status: Home / Self Care | Payer: MEDICAID | Attending: Internal Medicine | Admitting: Internal Medicine

## 2012-05-03 ENCOUNTER — Encounter (HOSPITAL_COMMUNITY): Payer: Self-pay

## 2012-05-03 DIAGNOSIS — K2990 Gastroduodenitis, unspecified, without bleeding: Secondary | ICD-10-CM

## 2012-05-03 DIAGNOSIS — F1910 Other psychoactive substance abuse, uncomplicated: Secondary | ICD-10-CM | POA: Diagnosis present

## 2012-05-03 DIAGNOSIS — R1115 Cyclical vomiting syndrome unrelated to migraine: Secondary | ICD-10-CM

## 2012-05-03 DIAGNOSIS — R112 Nausea with vomiting, unspecified: Principal | ICD-10-CM | POA: Diagnosis present

## 2012-05-03 DIAGNOSIS — G8929 Other chronic pain: Secondary | ICD-10-CM | POA: Diagnosis present

## 2012-05-03 DIAGNOSIS — R1011 Right upper quadrant pain: Secondary | ICD-10-CM | POA: Diagnosis present

## 2012-05-03 DIAGNOSIS — F17200 Nicotine dependence, unspecified, uncomplicated: Secondary | ICD-10-CM | POA: Diagnosis present

## 2012-05-03 DIAGNOSIS — R109 Unspecified abdominal pain: Secondary | ICD-10-CM | POA: Diagnosis present

## 2012-05-03 DIAGNOSIS — R9431 Abnormal electrocardiogram [ECG] [EKG]: Secondary | ICD-10-CM | POA: Diagnosis present

## 2012-05-03 DIAGNOSIS — J45909 Unspecified asthma, uncomplicated: Secondary | ICD-10-CM | POA: Diagnosis present

## 2012-05-03 DIAGNOSIS — D72829 Elevated white blood cell count, unspecified: Secondary | ICD-10-CM | POA: Diagnosis present

## 2012-05-03 DIAGNOSIS — J209 Acute bronchitis, unspecified: Secondary | ICD-10-CM | POA: Diagnosis present

## 2012-05-03 DIAGNOSIS — K2970 Gastritis, unspecified, without bleeding: Secondary | ICD-10-CM

## 2012-05-03 DIAGNOSIS — Z9089 Acquired absence of other organs: Secondary | ICD-10-CM

## 2012-05-03 LAB — URINALYSIS, DIPSTICK ONLY
Bilirubin Urine: NEGATIVE
Glucose, UA: NEGATIVE mg/dL
Hgb urine dipstick: NEGATIVE
Ketones, ur: 15 mg/dL — AB
Nitrite: POSITIVE — AB
Protein, ur: NEGATIVE mg/dL
Specific Gravity, Urine: 1.03 (ref 1.005–1.030)
Urobilinogen, UA: 1 mg/dL (ref 0.0–1.0)
pH: 6.5 (ref 5.0–8.0)

## 2012-05-03 LAB — BASIC METABOLIC PANEL
BUN: 8 mg/dL (ref 6–23)
CO2: 25 mEq/L (ref 19–32)
Calcium: 9.6 mg/dL (ref 8.4–10.5)
Chloride: 104 mEq/L (ref 96–112)
Creatinine, Ser: 0.83 mg/dL (ref 0.50–1.35)
GFR calc Af Amer: >90 mL/min (ref >90)
GFR calc non Af Amer: >90 mL/min (ref >90)
Glucose, Bld: 124 mg/dL — ABNORMAL HIGH (ref 70–99)
Potassium: 3.6 mEq/L (ref 3.5–5.1)
Sodium: 138 mEq/L (ref 135–145)

## 2012-05-03 LAB — LIPASE, BLOOD: Lipase: 37 U/L (ref 11–59)

## 2012-05-03 LAB — HEPATIC FUNCTION PANEL
ALT: 13 U/L (ref 0–53)
AST: 16 U/L (ref 0–37)
Albumin: 3.7 g/dL (ref 3.5–5.2)
Alkaline Phosphatase: 66 U/L (ref 39–117)
Bilirubin, Direct: 0.2 mg/dL (ref 0.0–0.3)
Indirect Bilirubin: 0.9 mg/dL (ref 0.3–0.9)
Total Bilirubin: 1.1 mg/dL (ref 0.3–1.2)
Total Protein: 6.7 g/dL (ref 6.0–8.3)

## 2012-05-03 LAB — RAPID URINE DRUG SCREEN, HOSP PERFORMED
Amphetamines: NOT DETECTED
Barbiturates: NOT DETECTED
Benzodiazepines: NOT DETECTED
Cocaine: NOT DETECTED
Opiates: POSITIVE — AB
Tetrahydrocannabinol: POSITIVE — AB

## 2012-05-03 LAB — CBC
HCT: 36.2 % — ABNORMAL LOW (ref 39.0–52.0)
Hemoglobin: 12.2 g/dL — ABNORMAL LOW (ref 13.0–17.0)
MCH: 31.3 pg (ref 26.0–34.0)
MCHC: 33.7 g/dL (ref 30.0–36.0)
MCV: 92.8 fL (ref 78.0–100.0)
Platelets: 172 10*3/uL (ref 150–400)
RBC: 3.9 MIL/uL — ABNORMAL LOW (ref 4.22–5.81)
RDW: 12.5 % (ref 11.5–15.5)
WBC: 12.1 10*3/uL — ABNORMAL HIGH (ref 4.0–10.5)

## 2012-05-03 MED ORDER — SODIUM CHLORIDE 0.9 % IV SOLN
INTRAVENOUS | Status: AC
  Administered 2012-05-03: 22:00:00 via INTRAVENOUS

## 2012-05-03 MED ORDER — SODIUM CHLORIDE 0.9 % IV BOLUS (SEPSIS)
1000.0000 mL | Freq: Once | INTRAVENOUS | Status: AC
  Administered 2012-05-03: 1000 mL via INTRAVENOUS

## 2012-05-03 MED ORDER — ACETAMINOPHEN 650 MG RE SUPP: 650.0000 mg | Freq: Four times a day (QID) | RECTAL | Status: DC | PRN

## 2012-05-03 MED ORDER — PANTOPRAZOLE SODIUM 40 MG IV SOLR
40.0000 mg | Freq: Every day | INTRAVENOUS | Status: DC
  Administered 2012-05-04 – 2012-05-05 (×3): 40 mg via INTRAVENOUS
  Filled 2012-05-03 (×5): qty 40

## 2012-05-03 MED ORDER — CIPROFLOXACIN IN D5W 400 MG/200ML IV SOLN
400.0000 mg | Freq: Two times a day (BID) | INTRAVENOUS | Status: DC
  Administered 2012-05-03 – 2012-05-05 (×4): 400 mg via INTRAVENOUS
  Filled 2012-05-03 (×5): qty 200

## 2012-05-03 MED ORDER — MORPHINE SULFATE 2 MG/ML IJ SOLN
1.0000 mg | Freq: Once | INTRAMUSCULAR | Status: AC
  Administered 2012-05-03: 1 mg via INTRAVENOUS
  Filled 2012-05-03: qty 1

## 2012-05-03 MED ORDER — METOCLOPRAMIDE HCL 5 MG/ML IJ SOLN
5.0000 mg | Freq: Once | INTRAMUSCULAR | Status: AC
  Administered 2012-05-04: 5 mg via INTRAVENOUS
  Filled 2012-05-03 (×2): qty 1

## 2012-05-03 MED ORDER — ONDANSETRON HCL 4 MG PO TABS: 4.0000 mg | ORAL_TABLET | Freq: Four times a day (QID) | ORAL | Status: DC | PRN

## 2012-05-03 MED ORDER — HYDROMORPHONE HCL PF 1 MG/ML IJ SOLN
1.0000 mg | Freq: Once | INTRAMUSCULAR | Status: AC
  Administered 2012-05-03: 1 mg via INTRAVENOUS
  Filled 2012-05-03: qty 1

## 2012-05-03 MED ORDER — ACETAMINOPHEN 325 MG PO TABS: 650.0000 mg | ORAL_TABLET | Freq: Four times a day (QID) | ORAL | Status: DC | PRN

## 2012-05-03 MED ORDER — INFLUENZA VIRUS VACC SPLIT PF IM SUSP: 0.5000 mL | INTRAMUSCULAR | Status: DC | PRN

## 2012-05-03 MED ORDER — ONDANSETRON HCL 4 MG/2ML IJ SOLN
4.0000 mg | Freq: Once | INTRAMUSCULAR | Status: AC
  Administered 2012-05-03: 4 mg via INTRAVENOUS
  Filled 2012-05-03: qty 2

## 2012-05-03 MED ORDER — ONDANSETRON HCL 4 MG/2ML IJ SOLN
INTRAMUSCULAR | Status: AC
  Administered 2012-05-03: 4 mg via INTRAVENOUS
  Filled 2012-05-03: qty 2

## 2012-05-03 MED ORDER — ONDANSETRON HCL 4 MG/2ML IJ SOLN
4.0000 mg | Freq: Once | INTRAMUSCULAR | Status: AC
  Administered 2012-05-03: 4 mg via INTRAVENOUS

## 2012-05-03 MED ORDER — HYDROMORPHONE HCL PF 1 MG/ML IJ SOLN
0.5000 mg | Freq: Four times a day (QID) | INTRAMUSCULAR | Status: DC | PRN
  Administered 2012-05-04 – 2012-05-06 (×7): 0.5 mg via INTRAVENOUS
  Filled 2012-05-03 (×7): qty 1

## 2012-05-03 MED ORDER — SODIUM CHLORIDE 0.9 % IV SOLN
INTRAVENOUS | Status: DC
  Administered 2012-05-04 (×2): via INTRAVENOUS
  Administered 2012-05-04: 100 mL/h via INTRAVENOUS
  Administered 2012-05-05 – 2012-05-06 (×3): via INTRAVENOUS

## 2012-05-03 MED ORDER — METOCLOPRAMIDE HCL 5 MG/ML IJ SOLN
10.0000 mg | Freq: Once | INTRAMUSCULAR | Status: AC
  Administered 2012-05-03: 10 mg via INTRAVENOUS
  Filled 2012-05-03: qty 2

## 2012-05-03 MED ORDER — ONDANSETRON HCL 4 MG/2ML IJ SOLN
4.0000 mg | Freq: Four times a day (QID) | INTRAMUSCULAR | Status: DC | PRN
  Administered 2012-05-04 – 2012-05-06 (×7): 4 mg via INTRAVENOUS
  Filled 2012-05-03 (×8): qty 2

## 2012-05-03 MED ORDER — PANTOPRAZOLE SODIUM 40 MG IV SOLR
40.0000 mg | Freq: Once | INTRAVENOUS | Status: AC
  Administered 2012-05-03: 40 mg via INTRAVENOUS
  Filled 2012-05-03: qty 40

## 2012-05-03 NOTE — ED Provider Notes (Signed)
History     CSN: [id number]  Arrival date & time 05/03/12  1311   First MD Initiated Contact with Patient 05/03/12 1348      Chief Complaint  Patient presents with  . Emesis    [name] is a 30yo man with PMH of asthma who comes in today via EMS for evaluation of persistent RUQ pain and multiple episodes of vomiting after leaving [facility] ED last night. In addition he has chills since last night.  The RUQ pain is constant, sharp to dull, worse with movement, and involves the right flank.   CT abdomen with contrast from 9/18 shows small free fluid but no acute processes.   UA from last night with trace ketones and small leukocytes. Urine culture is still pending.   Past Medical History  Diagnosis Date  . Asthma     Past Surgical History  Procedure Date  . Cholecystectomy     No family history on file.  History  Substance Use Topics  . Smoking status: Current Every Day Smoker  . Smokeless tobacco: Not on file  . Alcohol Use: No      Review of Systems  Constitutional: Positive for chills. Negative for fever and fatigue.  HENT: Negative for neck pain.   Eyes: Negative for pain.  Respiratory: Negative for cough, chest tightness and shortness of breath.   Cardiovascular: Negative for chest pain and palpitations.  Gastrointestinal: Positive for nausea, vomiting and abdominal pain.  Genitourinary: Positive for flank pain. Negative for dysuria and difficulty urinating.  Skin: Negative for rash.  Neurological: Negative for dizziness, light-headedness and headaches.    Allergies  Review of patient's allergies indicates no known allergies.  Home Medications   Current Outpatient Rx  Name Route Sig Dispense Refill  . IBUPROFEN 200 MG PO TABS Oral Take 400 mg by mouth every 6 (six) hours as needed. For pain    . HYDROCODONE-ACETAMINOPHEN 5-325 MG PO TABS Oral Take 1-2 tablets by mouth every 6 (six) hours as needed for pain. 16 tablet 0  . ONDANSETRON 8 MG PO TBDP Oral  Take 1 tablet (8 mg total) by mouth every 8 (eight) hours as needed for nausea. 20 tablet 0    BP 138/91  Pulse 81  Temp 98.4 F (36.9 C)  Resp 20  SpO2 100%  Physical Exam  Constitutional: He is oriented to person, place, and time. He appears distressed.       Distress 2/2 pain  Eyes: Conjunctivae normal are normal. Right eye exhibits no discharge. No scleral icterus.  Cardiovascular: Regular rhythm, normal heart sounds and intact distal pulses.  Exam reveals no gallop and no friction rub.   No murmur heard.      Bradycardic   Pulmonary/Chest: Effort normal and breath sounds normal. No respiratory distress. He has no wheezes. He has no rales.  Abdominal: Soft. Bowel sounds are normal. He exhibits no distension and no mass. There is tenderness. There is guarding. There is no rebound.       Diffusely tender but with greater pain in RUQ. Voluntary guarding  Musculoskeletal: He exhibits no edema.  Neurological: He is alert and oriented to person, place, and time.  Skin: Skin is warm and dry. No rash noted. He is not diaphoretic.  Psychiatric: He has a normal mood and affect.    ED Course  Procedures (including critical care time)   Labs Reviewed  CBC  URINALYSIS, DIPSTICK ONLY   Ct Abdomen Pelvis W Contrast  05/02/2012  *  RADIOLOGY REPORT*  Clinical Data: Nausea and vomiting with increased weakness.  CT ABDOMEN AND PELVIS WITH CONTRAST  Technique:  Multidetector CT imaging of the abdomen and pelvis was performed following the standard protocol during bolus administration of intravenous contrast.  Contrast: OMNIPAQUE IOHEXOL 300 MG/ML  SOLN  Comparison: 02/20/2011.  Findings: Small amount of free fluid the pelvis.  Source indeterminate.  No discrete bowel inflammatory process is noted. Evaluation of portions of bowel limited by under distension. No inflammation surrounds the appendix.  No free intraperitoneal air.  No focal hepatic, splenic, pancreatic, adrenal or renal lesion.  Post cholecystectomy.  No abdominal aortic aneurysm.  No bony destructive lesion.  IMPRESSION: Small amount of free fluid the pelvis.  Source indeterminate. Please see above.   Original Report Authenticated By: Fuller Canada, M.D.      No diagnosis found.    MDM  31 yo man with PMH of recurrent abdominal pain presenting with worsening emesis and chills.   Nausea/Vomiting: This appears to be acute on chronic for this patient with negative labs and imaging. Gastric emptying study with delay, this could be secondary to gastroparesis. Will give NS bolus, Zofran, Reglan, Protonix, all IV. Patient continues to be nauseated will continue Zofran, Reglan and move patient to CDU for further IV hydration.           Ky Barban, MD 05/03/12 1914  Ky Barban, MD 05/03/12 [phone]

## 2012-05-03 NOTE — ED Notes (Signed)
Per EMS pt was seen at [facility] yesterday for N/V and discharged with zofran which hasn't been helping. Pt had his gallbladder removed about 10 mths ago and is c/o upper RQ pain. HR in the 40s. 20g LAC NSL

## 2012-05-03 NOTE — Progress Notes (Signed)
ANTIBIOTIC CONSULT NOTE - INITIAL  Pharmacy Consult for Cipro Indication: UTI  No Known Allergies  Patient Measurements:    Vital Signs: Temp: 98.4 F (36.9 C) (09/19 1311) BP: 128/64 mmHg (09/19 2200) Pulse Rate: 57  (09/19 2200) Intake/Output from previous day:   Intake/Output from this shift:    Labs:  Basename 05/03/12 1514 05/03/12 1416 05/02/12 1500  WBC -- 12.1* 10.7*  HGB -- 12.2* 13.6  PLT -- 172 232  LABCREA -- -- --  CREATININE 0.83 -- 0.86   The CrCl is unknown because both a height and weight (above a minimum accepted value) are required for this calculation. No results found for this basename: VANCOTROUGH:2,VANCOPEAK:2,VANCORANDOM:2,GENTTROUGH:2,GENTPEAK:2,GENTRANDOM:2,TOBRATROUGH:2,TOBRAPEAK:2,TOBRARND:2,AMIKACINPEAK:2,AMIKACINTROU:2,AMIKACIN:2, in the last 72 hours   Microbiology: No results found for this or any previous visit (from the past 720 hour(s)).  Medical History: Past Medical History  Diagnosis Date  . Asthma     Assessment: 31 yo male to start Cipro for UTI (dosing for complicated UTI). CrCl >100 ml/min. WBC elevated at 12.1 (increased from 10.7 yesterday), afebrile, no cultures. All meds have been given as IV thus far.   Goal of Therapy:  Resolution of Infection  Plan:  1. Cipro 400 mg IV q12h 2. Follow up renal function, clinical progress, fever curve, LOT  Matthew Scott 05/03/2012,10:37 PM

## 2012-05-03 NOTE — ED Notes (Signed)
Orange juice given to pt per request. Family at bedside.

## 2012-05-03 NOTE — ED Provider Notes (Signed)
History     CSN: [id number]  Arrival date & time 05/03/12  1311   First MD Initiated Contact with Patient 05/03/12 1348      Chief Complaint  Patient presents with  . Emesis    (Consider location/radiation/quality/duration/timing/severity/associated sxs/prior treatment) HPI  Past Medical History  Diagnosis Date  . Asthma     Past Surgical History  Procedure Date  . Cholecystectomy     No family history on file.  History  Substance Use Topics  . Smoking status: Current Every Day Smoker  . Smokeless tobacco: Not on file  . Alcohol Use: No      Review of Systems  Allergies  Review of patient's allergies indicates no known allergies.  Home Medications   Current Outpatient Rx  Name Route Sig Dispense Refill  . IBUPROFEN 200 MG PO TABS Oral Take 400 mg by mouth every 6 (six) hours as needed. For pain    . HYDROCODONE-ACETAMINOPHEN 5-325 MG PO TABS Oral Take 1-2 tablets by mouth every 6 (six) hours as needed for pain. 16 tablet 0  . ONDANSETRON 8 MG PO TBDP Oral Take 1 tablet (8 mg total) by mouth every 8 (eight) hours as needed for nausea. 20 tablet 0    BP 157/90  Pulse 81  Temp 98.4 F (36.9 C)  Resp 20  SpO2 100%  Physical Exam  ED Course  Procedures (including critical care time)  Labs Reviewed  CBC - Abnormal; Notable for the following:    WBC 12.1 (*)     RBC 3.90 (*)     Hemoglobin 12.2 (*)     HCT 36.2 (*)     All other components within normal limits  URINALYSIS, DIPSTICK ONLY  URINE RAPID DRUG SCREEN (HOSP PERFORMED)  BASIC METABOLIC PANEL   Ct Abdomen Pelvis W Contrast  05/02/2012  *RADIOLOGY REPORT*  Clinical Data: Nausea and vomiting with increased weakness.  CT ABDOMEN AND PELVIS WITH CONTRAST  Technique:  Multidetector CT imaging of the abdomen and pelvis was performed following the standard protocol during bolus administration of intravenous contrast.  Contrast: OMNIPAQUE IOHEXOL 300 MG/ML  SOLN  Comparison: 02/20/2011.   Findings: Small amount of free fluid the pelvis.  Source indeterminate.  No discrete bowel inflammatory process is noted. Evaluation of portions of bowel limited by under distension. No inflammation surrounds the appendix.  No free intraperitoneal air.  No focal hepatic, splenic, pancreatic, adrenal or renal lesion. Post cholecystectomy.  No abdominal aortic aneurysm.  No bony destructive lesion.  IMPRESSION: Small amount of free fluid the pelvis.  Source indeterminate. Please see above.   Original Report Authenticated By: Fuller Canada, M.D.      No diagnosis found.    MDM  Iv ns. reglan iv. protonix iv. Dilaudid 1 mg iv. zofran iv.  Additional ns bolus.  On review notes/charts, hx extensive gi eval in past for same. Did have delayed gastric emptying on nm scan. Recent ct abd neg. In past ?psychosomatic component to symptoms.   Recheck abd soft nt.   Signed out to CDU PA, working w Dr Weldon Inches, to follow up on labs, reassess post ivf and meds, anticipate probable subsequent d/c home.    I saw and evaluated the patient, reviewed the resident's note and I agree with the findings and plan.         Suzi Roots, MD 05/03/12 1550

## 2012-05-03 NOTE — ED Notes (Signed)
Pt denies nausea.

## 2012-05-03 NOTE — ED Notes (Signed)
Pt vomiting at bedside.

## 2012-05-03 NOTE — H&P (Addendum)
Matthew Scott is an 31 y.o. male.  Patient was seen and examined on May 03, 2012 at 10:15 PM. [facility] - [facility]  Chief Complaint: Nausea vomiting abdominal pain. HPI: 31 year old male with history of chronic nausea vomiting abdominal pain who had undergone cholecystectomy last year for similar complaints presents with complaint of persistent nausea vomiting for the last 2 days. Patient also has been having right upper quadrant pain. Patient had gone to [facility] yesterday and had a CT abdomen pelvis which was unremarkable except for some free fluid and patient was discharged home on pain medications and antiemetics. Despite which patient still had nausea vomiting presented to the ER today at [facility]. Since patient has been having persistent nausea and vomiting with some right upper quadrant discomfort patient is admitted for further observation. Denies any fever chills chest pain shortness of breath diarrhea or any dysuria. Labs show mildly increased white cell count.  Past Medical History  Diagnosis Date  . Asthma     Past Surgical History  Procedure Date  . Cholecystectomy     Family History  Problem Relation Age of Onset  . Other Father    Social History:  reports that he has been smoking.  He does not have any smokeless tobacco history on file. He reports that he does not drink alcohol. His drug history not on file.  Allergies: No Known Allergies   (Not in a hospital admission)  Results for orders placed during the hospital encounter of 05/03/12 (from the past 48 hour(s))  CBC     Status: Abnormal   Collection Time   05/03/12  2:16 PM      Component Value Range Comment   WBC 12.1 (*) 4.0 - 10.5 K/uL    RBC 3.90 (*) 4.22 - 5.81 MIL/uL    Hemoglobin 12.2 (*) 13.0 - 17.0 g/dL    HCT 44.0 (*) 34.7 - 52.0 %    MCV 92.8  78.0 - 100.0 fL    MCH 31.3  26.0 - 34.0 pg    MCHC 33.7  30.0 - 36.0 g/dL    RDW 42.5  95.6 - 38.7 %    Platelets 172  150 -  400 K/uL   BASIC METABOLIC PANEL     Status: Abnormal   Collection Time   05/03/12  3:14 PM      Component Value Range Comment   Sodium 138  135 - 145 mEq/L    Potassium 3.6  3.5 - 5.1 mEq/L    Chloride 104  96 - 112 mEq/L    CO2 25  19 - 32 mEq/L    Glucose, Bld 124 (*) 70 - 99 mg/dL    BUN 8  6 - 23 mg/dL    Creatinine, Ser 5.64  0.50 - 1.35 mg/dL    Calcium 9.6  8.4 - 33.2 mg/dL    GFR calc non Af Amer >90  >90 mL/min    GFR calc Af Amer >90  >90 mL/min   URINALYSIS, DIPSTICK ONLY     Status: Abnormal   Collection Time   05/03/12  4:05 PM      Component Value Range Comment   Specific Gravity, Urine 1.030  1.005 - 1.030    pH 6.5  5.0 - 8.0    Glucose, UA NEGATIVE  NEGATIVE mg/dL    Hgb urine dipstick NEGATIVE  NEGATIVE    Bilirubin Urine NEGATIVE  NEGATIVE    Ketones, ur 15 (*)  NEGATIVE mg/dL    Protein, ur NEGATIVE  NEGATIVE mg/dL    Urobilinogen, UA 1.0  0.0 - 1.0 mg/dL    Nitrite POSITIVE (*) NEGATIVE    Leukocytes, UA SMALL (*) NEGATIVE   URINE RAPID DRUG SCREEN (HOSP PERFORMED)     Status: Abnormal   Collection Time   05/03/12  4:05 PM      Component Value Range Comment   Opiates POSITIVE (*) NONE DETECTED    Cocaine NONE DETECTED  NONE DETECTED    Benzodiazepines NONE DETECTED  NONE DETECTED    Amphetamines NONE DETECTED  NONE DETECTED    Tetrahydrocannabinol POSITIVE (*) NONE DETECTED    Barbiturates NONE DETECTED  NONE DETECTED    Ct Abdomen Pelvis W Contrast  05/02/2012  *RADIOLOGY REPORT*  Clinical Data: Nausea and vomiting with increased weakness.  CT ABDOMEN AND PELVIS WITH CONTRAST  Technique:  Multidetector CT imaging of the abdomen and pelvis was performed following the standard protocol during bolus administration of intravenous contrast.  Contrast: OMNIPAQUE IOHEXOL 300 MG/ML  SOLN  Comparison: 02/20/2011.  Findings: Small amount of free fluid the pelvis.  Source indeterminate.  No discrete bowel inflammatory process is noted. Evaluation of portions of  bowel limited by under distension. No inflammation surrounds the appendix.  No free intraperitoneal air.  No focal hepatic, splenic, pancreatic, adrenal or renal lesion. Post cholecystectomy.  No abdominal aortic aneurysm.  No bony destructive lesion.  IMPRESSION: Small amount of free fluid the pelvis.  Source indeterminate. Please see above.   Original Report Authenticated By: Fuller Canada, M.D.     Review of Systems  Constitutional: Negative.   HENT: Negative.   Eyes: Negative.   Respiratory: Negative.   Cardiovascular: Negative.   Gastrointestinal: Positive for nausea, vomiting and abdominal pain.  Genitourinary: Negative.   Musculoskeletal: Negative.   Skin: Negative.   Neurological: Negative.   Endo/Heme/Allergies: Negative.   Psychiatric/Behavioral: Negative.     Blood pressure 128/64, pulse 57, temperature 98.4 F (36.9 C), resp. rate 19, SpO2 99.00%. Physical Exam  Constitutional: He is oriented to person, place, and time. He appears well-developed and well-nourished. No distress.  HENT:  Head: Normocephalic and atraumatic.  Right Ear: External ear normal.  Left Ear: External ear normal.  Nose: Nose normal.  Mouth/Throat: Oropharynx is clear and moist. No oropharyngeal exudate.  Eyes: Conjunctivae normal are normal. Pupils are equal, round, and reactive to light. Right eye exhibits no discharge. Left eye exhibits no discharge. No scleral icterus.  Neck: Normal range of motion. Neck supple.  Cardiovascular: Normal rate and regular rhythm.   Respiratory: Effort normal and breath sounds normal. No respiratory distress. He has no wheezes. He has no rales.  GI: Soft. Bowel sounds are normal. He exhibits no distension. There is no tenderness. There is no rebound.  Musculoskeletal: Normal range of motion. He exhibits no edema and no tenderness.  Neurological: He is alert and oriented to person, place, and time.       Moves all extremities.  Skin: Skin is warm and dry. He is  not diaphoretic.  Psychiatric: His behavior is normal.     Assessment/Plan #1. Persistent nausea vomiting and abdominal pain - patient at this time is admitted for observation. Will be kept on clear liquid diet and advance as tolerated. Since patient has mild leukocytosis and possible UTI I have placed patient on ciprofloxacin. Check urine cultures. Gentle hydration. Patient's CAT scan did not show any signs of obstruction so we'll try one  dose of IV Reglan. Add Protonix. #2. Abnormal EKG - since patient's monitor was showing no P waves I had ordered EKG which shows atrial rhythm. I discussed the EKG with cardiologist Dr. Shirlee Latch who advised to monitor in telemetry and look for any symptoms of bradycardia. Check magnesium levels at this time and also closely follow potassium levels. Checking TSH. #3. Tobacco abuse - advised to quit smoking.  CODE STATUS - full code.  Eduard Clos 05/03/2012, 10:32 PM

## 2012-05-04 ENCOUNTER — Inpatient Hospital Stay (HOSPITAL_COMMUNITY): Payer: MEDICAID

## 2012-05-04 DIAGNOSIS — K2970 Gastritis, unspecified, without bleeding: Secondary | ICD-10-CM

## 2012-05-04 LAB — CBC WITH DIFFERENTIAL/PLATELET
Basophils Absolute: 0 10*3/uL (ref 0.0–0.1)
Basophils Relative: 0 % (ref 0–1)
Eosinophils Absolute: 0 10*3/uL (ref 0.0–0.7)
Eosinophils Relative: 0 % (ref 0–5)
HCT: 36.6 % — ABNORMAL LOW (ref 39.0–52.0)
Hemoglobin: 12.1 g/dL — ABNORMAL LOW (ref 13.0–17.0)
Lymphocytes Relative: 13 % (ref 12–46)
Lymphs Abs: 1.3 10*3/uL (ref 0.7–4.0)
MCH: 30.8 pg (ref 26.0–34.0)
MCHC: 33.1 g/dL (ref 30.0–36.0)
MCV: 93.1 fL (ref 78.0–100.0)
Monocytes Absolute: 0.7 10*3/uL (ref 0.1–1.0)
Monocytes Relative: 7 % (ref 3–12)
Neutro Abs: 7.7 10*3/uL (ref 1.7–7.7)
Neutrophils Relative %: 80 % — ABNORMAL HIGH (ref 43–77)
Platelets: 200 10*3/uL (ref 150–400)
RBC: 3.93 MIL/uL — ABNORMAL LOW (ref 4.22–5.81)
RDW: 12.5 % (ref 11.5–15.5)
WBC: 9.6 10*3/uL (ref 4.0–10.5)

## 2012-05-04 LAB — COMPREHENSIVE METABOLIC PANEL
ALT: 16 U/L (ref 0–53)
AST: 16 U/L (ref 0–37)
Albumin: 3.8 g/dL (ref 3.5–5.2)
Alkaline Phosphatase: 69 U/L (ref 39–117)
BUN: 7 mg/dL (ref 6–23)
CO2: 26 mEq/L (ref 19–32)
Calcium: 9.5 mg/dL (ref 8.4–10.5)
Chloride: 105 mEq/L (ref 96–112)
Creatinine, Ser: 0.91 mg/dL (ref 0.50–1.35)
GFR calc Af Amer: >90 mL/min (ref >90)
GFR calc non Af Amer: >90 mL/min (ref >90)
Glucose, Bld: 105 mg/dL — ABNORMAL HIGH (ref 70–99)
Potassium: 3.4 mEq/L — ABNORMAL LOW (ref 3.5–5.1)
Sodium: 141 mEq/L (ref 135–145)
Total Bilirubin: 1.6 mg/dL — ABNORMAL HIGH (ref 0.3–1.2)
Total Protein: 6.7 g/dL (ref 6.0–8.3)

## 2012-05-04 LAB — MAGNESIUM: Magnesium: 1.8 mg/dL (ref 1.5–2.5)

## 2012-05-04 LAB — TSH: TSH: 1.71 u[IU]/mL (ref 0.350–4.500)

## 2012-05-04 LAB — MRSA PCR SCREENING: MRSA by PCR: NEGATIVE

## 2012-05-04 MED ORDER — PROMETHAZINE HCL 25 MG/ML IJ SOLN
25.0000 mg | Freq: Once | INTRAMUSCULAR | Status: AC
  Administered 2012-05-04: 25 mg via INTRAVENOUS
  Filled 2012-05-04: qty 1

## 2012-05-04 MED ORDER — AZITHROMYCIN 500 MG PO TABS
500.0000 mg | ORAL_TABLET | Freq: Every day | ORAL | Status: AC
  Administered 2012-05-04: 500 mg via ORAL
  Filled 2012-05-04: qty 1

## 2012-05-04 MED ORDER — AZITHROMYCIN 250 MG PO TABS
250.0000 mg | ORAL_TABLET | Freq: Every day | ORAL | Status: DC
  Administered 2012-05-05 – 2012-05-06 (×2): 250 mg via ORAL
  Filled 2012-05-04 (×2): qty 1

## 2012-05-04 MED ORDER — GUAIFENESIN ER 600 MG PO TB12
1200.0000 mg | ORAL_TABLET | Freq: Two times a day (BID) | ORAL | Status: DC
  Administered 2012-05-04 – 2012-05-06 (×4): 1200 mg via ORAL
  Filled 2012-05-04 (×5): qty 2

## 2012-05-04 MED ORDER — POTASSIUM CHLORIDE CRYS ER 20 MEQ PO TBCR
60.0000 meq | EXTENDED_RELEASE_TABLET | Freq: Two times a day (BID) | ORAL | Status: DC
  Administered 2012-05-04 – 2012-05-05 (×2): 60 meq via ORAL
  Filled 2012-05-04 (×3): qty 3

## 2012-05-04 MED ORDER — BOOST / RESOURCE BREEZE PO LIQD
1.0000 | Freq: Three times a day (TID) | ORAL | Status: DC
  Administered 2012-05-05 – 2012-05-06 (×5): 1 via ORAL

## 2012-05-04 NOTE — Progress Notes (Signed)
Pt arrived via wheelchair with girlfriend.  Pt able to ambulate to bed without assistance and denied any complaints.  Oriented pt to surroundings and instructed to use call bell for assistance.  Safety video viewed.

## 2012-05-04 NOTE — Progress Notes (Signed)
INITIAL ADULT NUTRITION ASSESSMENT Date: 05/04/2012   Time: 1:21 PM Reason for Assessment: Malnutrition Screening Tool- 3  ASSESSMENT: Male 31 y.o.  Dx: Persistent nausea, vomiting, and abdominal pain; possible UTI; Abnormal EKG  Past Medical History  Diagnosis Date  . Asthma    Scheduled Meds:    . sodium chloride   Intravenous STAT  . ciprofloxacin  400 mg Intravenous Q12H  . HYDROmorphone  1 mg Intravenous Once  . metoCLOPramide (REGLAN) injection  10 mg Intravenous Once  . metoCLOPramide (REGLAN) injection  5 mg Intravenous Once  .  morphine injection  1 mg Intravenous Once  . ondansetron (ZOFRAN) IV  4 mg Intravenous Once  . ondansetron (ZOFRAN) IV  4 mg Intravenous Once  . pantoprazole (PROTONIX) IV  40 mg Intravenous Once  . pantoprazole (PROTONIX) IV  40 mg Intravenous QHS  . promethazine  25 mg Intravenous Once  . sodium chloride  1,000 mL Intravenous Once  . sodium chloride  1,000 mL Intravenous Once   Continuous Infusions:    . sodium chloride 100 mL/hr at 05/04/12 0902   PRN Meds:.acetaminophen, acetaminophen, HYDROmorphone (DILAUDID) injection, influenza  inactive virus vaccine, ondansetron (ZOFRAN) IV, ondansetron   Ht: 5\' 8"  (172.7 cm)  Wt: 181 lb 3.5 oz (82.2 kg)  Ideal Wt: 70 kg  % Ideal Wt: 117%  Usual Wt: 181-187 lb % Usual Wt: 97-100% Wt Readings from Last 10 Encounters:  05/03/12 181 lb 3.5 oz (82.2 kg)  12/09/09 187 lb 1.6 oz (84.868 kg)    Body mass index is 27.55 kg/(m^2).  Food/Nutrition Related Hx: Pt reports persistent nausea, vomiting, and abdominal pain PTA. Pt states he was eating well PTA, but has no appetite right now. Pt denies nausea and abdominal pain at this time.  Pt's lunch tray was in the room, but pt had not been interested in eating any of it. Pt willing to try [employer] with meals.  Labs:  CMP     Component Value Date/Time   NA 141 05/04/2012 0640   K 3.4* 05/04/2012 0640   CL 105 05/04/2012 0640   CO2 26  05/04/2012 0640   GLUCOSE 105* 05/04/2012 0640   BUN 7 05/04/2012 0640   CREATININE 0.91 05/04/2012 0640   CALCIUM 9.5 05/04/2012 0640   PROT 6.7 05/04/2012 0640   ALBUMIN 3.8 05/04/2012 0640   AST 16 05/04/2012 0640   ALT 16 05/04/2012 0640   ALKPHOS 69 05/04/2012 0640   BILITOT 1.6* 05/04/2012 0640   GFRNONAA >90 05/04/2012 0640   GFRAA >90 05/04/2012 0640     Intake/Output Summary (Last 24 hours) at 05/04/12 1321 Last data filed at 05/04/12 0900  Gross per 24 hour  Intake   1350 ml  Output   1650 ml  Net   -300 ml     Diet Order: Clear Liquid  Supplements/Tube Feeding: None at this time.  IVF:     sodium chloride Last Rate: 100 mL/hr at 05/04/12 0902    Estimated Nutritional Needs:   Kcal: 2050- 2250  Protein: 75- 85 gm Fluid: >2.2 L  NUTRITION DIAGNOSIS: -Inadequate oral intake (NI-2.1).  Status: Ongoing  RELATED TO: decreased appetite  AS EVIDENCE BY: pt comments  MONITORING/EVALUATION(Goals): Goal: 1) Pt to consume >/= 75% meals and supplements. 2) Pt to tolerate diet advancement. Monitor: PO intake, supplement acceptance, weight, labs  EDUCATION NEEDS: -No education needs identified at this time  INTERVENTION: Encourage PO intake at meals. Resource Breeze TID with meals, per pt preference (  250 kcal, 9 gm protein per 8 oz).   DOCUMENTATION CODES Per approved criteria  -Not Applicable    Matthew Scott 05/04/2012, 1:21 PM

## 2012-05-04 NOTE — Progress Notes (Signed)
[facility] PROGRESS NOTE  Matthew Scott OZH:[id number] DOB: 1980/09/07 DOA: 05/03/2012 PCP: No primary provider on file.  Assessment/Plan: Active Problems:  Nausea and vomiting   RUQ abdominal pain -unclear etiology, has history of chronic abdominal pain status post cholecystectomy. -I will obtain chest x-ray, recent CT scan of abdomen and pelvis did not show any abnormality. -Treat symptomatically with pain medications and antiemetics, advance diet as tolerated.  Acute bronchitis -Patient is active smoker, he has cough and sputum production. -On auscultation he does have bilateral rhonchi, lots of pain chest x-ray and start him on Z-Pak  Abnormal EKG -There was concern about accelerated junctional rhythm, his potassium is low, but he is normal magnesium. -I'll repeat the potassium, recheck 12-lead EKG in the morning  Code Status: full Family Communication:  Disposition Plan: remains as inpatient   Brief narrative: 31 year old male with past medical history of chronic abdominal pain came in with RUQ abdominal pain and he also has cough and sputum production  Consultants:  none  Procedures:  none  Antibiotics:  Azithromycin and Rocephin start today  HPI/Subjective: Continues to have right upper quadrant abdominal pain  Objective: Filed Vitals:   05/03/12 2200 05/03/12 2317 05/04/12 0603 05/04/12 1425  BP: 128/64 131/70 148/93 127/66  Pulse: 57 57 66 68  Temp:  98.9 F (37.2 C) 98.9 F (37.2 C) 100 F (37.8 C)  TempSrc:  Oral Oral Oral  Resp: 19 24 20 20   Height:  5\' 8"  (1.727 m)    Weight:  82.2 kg (181 lb 3.5 oz)    SpO2: 99% 97% 98% 98%    Intake/Output Summary (Last 24 hours) at 05/04/12 1811 Last data filed at 05/04/12 1427  Gross per 24 hour  Intake    470 ml  Output   1850 ml  Net  -1380 ml   Filed Weights   05/03/12 2317  Weight: 82.2 kg (181 lb 3.5 oz)    Exam: General: Alert and awake, oriented x3, not in any acute  distress. HEENT: anicteric sclera, pupils reactive to light and accommodation, EOMI CVS: S1-S2 clear, no murmur rubs or gallops Chest: bilateral rhonchi Abdomen: soft nontender, nondistended, normal bowel sounds, no organomegaly Extremities: no cyanosis, clubbing or edema noted bilaterally Neuro: Cranial nerves II-XII intact, no focal neurological deficits  Data Reviewed: Basic Metabolic Panel:  Lab 05/04/12 6295 05/03/12 2150 05/03/12 1514 05/02/12 1500  NA 141 -- 138 140  K 3.4* -- 3.6 4.0  CL 105 -- 104 103  CO2 26 -- 25 24  GLUCOSE 105* -- 124* 168*  BUN 7 -- 8 9  CREATININE 0.91 -- 0.83 0.86  CALCIUM 9.5 -- 9.6 9.8  MG -- 1.8 -- --  PHOS -- -- -- --   Liver Function Tests:  Lab 05/04/12 0640 05/03/12 2150 05/02/12 1500  AST 16 16 19   ALT 16 13 17   ALKPHOS 69 66 80  BILITOT 1.6* 1.1 1.0  PROT 6.7 6.7 7.4  ALBUMIN 3.8 3.7 4.2    Lab 05/03/12 2150 05/02/12 1500  LIPASE 37 14  AMYLASE -- --   No results found for this basename: AMMONIA:5 in the last 168 hours CBC:  Lab 05/04/12 0640 05/03/12 1416 05/02/12 1500  WBC 9.6 12.1* 10.7*  NEUTROABS 7.7 -- --  HGB 12.1* 12.2* 13.6  HCT 36.6* 36.2* 40.4  MCV 93.1 92.8 93.1  PLT 200 172 232   Cardiac Enzymes: No results found for this basename: CKTOTAL:5,CKMB:5,CKMBINDEX:5,TROPONINI:5 in the last 168 hours  BNP (last 3 results) No results found for this basename: PROBNP:3 in the last 8760 hours CBG: No results found for this basename: GLUCAP:5 in the last 168 hours  Recent Results (from the past 240 hour(s))  MRSA PCR SCREENING     Status: Normal   Collection Time   05/04/12 12:28 AM      Component Value Range Status Comment   MRSA by PCR NEGATIVE  NEGATIVE Final      Studies: Ct Abdomen Pelvis W Contrast  05/02/2012  *RADIOLOGY REPORT*  Clinical Data: Nausea and vomiting with increased weakness.  CT ABDOMEN AND PELVIS WITH CONTRAST  Technique:  Multidetector CT imaging of the abdomen and pelvis was performed  following the standard protocol during bolus administration of intravenous contrast.  Contrast: OMNIPAQUE IOHEXOL 300 MG/ML  SOLN  Comparison: 02/20/2011.  Findings: Small amount of free fluid the pelvis.  Source indeterminate.  No discrete bowel inflammatory process is noted. Evaluation of portions of bowel limited by under distension. No inflammation surrounds the appendix.  No free intraperitoneal air.  No focal hepatic, splenic, pancreatic, adrenal or renal lesion. Post cholecystectomy.  No abdominal aortic aneurysm.  No bony destructive lesion.  IMPRESSION: Small amount of free fluid the pelvis.  Source indeterminate. Please see above.   Original Report Authenticated By: Fuller Canada, M.D.     Scheduled Meds:   . sodium chloride   Intravenous STAT  . ciprofloxacin  400 mg Intravenous Q12H  . feeding supplement  1 Container Oral TID WC  . HYDROmorphone  1 mg Intravenous Once  . metoCLOPramide (REGLAN) injection  5 mg Intravenous Once  . pantoprazole (PROTONIX) IV  40 mg Intravenous QHS  . promethazine  25 mg Intravenous Once   Continuous Infusions:   . sodium chloride 100 mL/hr at 05/04/12 4782    Active Problems:  Nausea and vomiting    Time spent: 35 minutes    [facility]  [facility] Pager [phone]. If 8PM-8AM, please contact night-coverage at [email or web address], password [facility] 05/04/2012, 6:11 PM  LOS: 1 day

## 2012-05-05 DIAGNOSIS — Z9089 Acquired absence of other organs: Secondary | ICD-10-CM

## 2012-05-05 LAB — BASIC METABOLIC PANEL
BUN: 7 mg/dL (ref 6–23)
CO2: 28 mEq/L (ref 19–32)
Calcium: 10 mg/dL (ref 8.4–10.5)
Chloride: 98 mEq/L (ref 96–112)
Creatinine, Ser: 0.93 mg/dL (ref 0.50–1.35)
GFR calc Af Amer: >90 mL/min (ref >90)
GFR calc non Af Amer: >90 mL/min (ref >90)
Glucose, Bld: 86 mg/dL (ref 70–99)
Potassium: 3.3 mEq/L — ABNORMAL LOW (ref 3.5–5.1)
Sodium: 136 mEq/L (ref 135–145)

## 2012-05-05 LAB — URINE CULTURE
Colony Count: 6000
Colony Count: NO GROWTH
Culture: NO GROWTH

## 2012-05-05 MED ORDER — PROMETHAZINE HCL 25 MG/ML IJ SOLN: 25.0000 mg | Freq: Four times a day (QID) | INTRAMUSCULAR | Status: DC | PRN

## 2012-05-05 MED ORDER — POTASSIUM CHLORIDE CRYS ER 20 MEQ PO TBCR
60.0000 meq | EXTENDED_RELEASE_TABLET | Freq: Four times a day (QID) | ORAL | Status: DC
  Administered 2012-05-05: 60 meq via ORAL
  Filled 2012-05-05: qty 3

## 2012-05-05 MED ORDER — POTASSIUM CHLORIDE 20 MEQ/15ML (10%) PO LIQD
60.0000 meq | Freq: Four times a day (QID) | ORAL | Status: AC
  Administered 2012-05-05: 60 meq via ORAL
  Filled 2012-05-05: qty 45

## 2012-05-05 NOTE — Progress Notes (Signed)
[facility] PROGRESS NOTE  Matthew Scott ZOX:[id number] DOB: [birth year] DOA: 05/03/2012 PCP: No primary provider on file.  Assessment/Plan: Active Problems:  Nausea and vomiting   RUQ abdominal pain -unclear etiology, has history of chronic abdominal pain status post cholecystectomy. -I will obtain chest x-ray, recent CT scan of abdomen and pelvis did not show any abnormality. -Treat symptomatically with pain medications and antiemetics, advance diet as tolerated. -Was started on ciprofloxacin for questionable UTI, culture is in significant, I will discontinue the antibiotics.  Acute bronchitis -Patient is active smoker, he has cough and sputum production. -On auscultation he does have bilateral rhonchi, lots of pain chest x-ray and start him on Z-Pak  Abnormal EKG -There was concern about accelerated junctional rhythm, his potassium is low, but he is normal magnesium. -I'll repeat the potassium, recheck 12-lead   Code Status: full Family Communication:  Disposition Plan: remains as inpatient   Brief narrative: 31 year old male with past medical history of chronic abdominal pain came in with RUQ abdominal pain and he also has cough and sputum production  Consultants:  none  Procedures:  none  Antibiotics:  Azithromycin started on 05/04/2012  HPI/Subjective: Continues to have right upper quadrant abdominal pain  Objective: Filed Vitals:   05/04/12 0603 05/04/12 1425 05/04/12 2140 05/05/12 0617  BP: 148/93 127/66 134/75 159/92  Pulse: 66 68 53 54  Temp: 98.9 F (37.2 C) 100 F (37.8 C) 99 F (37.2 C) 99 F (37.2 C)  TempSrc: Oral Oral  Oral  Resp: 20 20 16 18   Height:      Weight:      SpO2: 98% 98% 97% 100%    Intake/Output Summary (Last 24 hours) at 05/05/12 1310 Last data filed at 05/05/12 0745  Gross per 24 hour  Intake 3743.33 ml  Output    975 ml  Net 2768.33 ml   Filed Weights   05/03/12 2317  Weight: 82.2 kg (181 lb 3.5 oz)     Exam: General: Alert and awake, oriented x3, not in any acute distress. HEENT: anicteric sclera, pupils reactive to light and accommodation, EOMI CVS: S1-S2 clear, no murmur rubs or gallops Chest: bilateral rhonchi Abdomen: soft nontender, nondistended, normal bowel sounds, no organomegaly Extremities: no cyanosis, clubbing or edema noted bilaterally Neuro: Cranial nerves II-XII intact, no focal neurological deficits  Data Reviewed: Basic Metabolic Panel:  Lab 05/04/12 8119 05/03/12 2150 05/03/12 1514 05/02/12 1500  NA 141 -- 138 140  K 3.4* -- 3.6 4.0  CL 105 -- 104 103  CO2 26 -- 25 24  GLUCOSE 105* -- 124* 168*  BUN 7 -- 8 9  CREATININE 0.91 -- 0.83 0.86  CALCIUM 9.5 -- 9.6 9.8  MG -- 1.8 -- --  PHOS -- -- -- --   Liver Function Tests:  Lab 05/04/12 0640 05/03/12 2150 05/02/12 1500  AST 16 16 19   ALT 16 13 17   ALKPHOS 69 66 80  BILITOT 1.6* 1.1 1.0  PROT 6.7 6.7 7.4  ALBUMIN 3.8 3.7 4.2    Lab 05/03/12 2150 05/02/12 1500  LIPASE 37 14  AMYLASE -- --   No results found for this basename: AMMONIA:5 in the last 168 hours CBC:  Lab 05/04/12 0640 05/03/12 1416 05/02/12 1500  WBC 9.6 12.1* 10.7*  NEUTROABS 7.7 -- --  HGB 12.1* 12.2* 13.6  HCT 36.6* 36.2* 40.4  MCV 93.1 92.8 93.1  PLT 200 172 232   Cardiac Enzymes: No results found for this basename: CKTOTAL:5,CKMB:5,CKMBINDEX:5,TROPONINI:5 in  the last 168 hours BNP (last 3 results) No results found for this basename: PROBNP:3 in the last 8760 hours CBG: No results found for this basename: GLUCAP:5 in the last 168 hours  Recent Results (from the past 240 hour(s))  URINE CULTURE     Status: Normal   Collection Time   05/02/12  3:33 PM      Component Value Range Status Comment   Specimen Description URINE, CLEAN CATCH   Final    Special Requests NONE   Final    Culture  Setup Time 05/03/2012 04:50   Final    Colony Count 6,000 COLONIES/ML   Final    Culture INSIGNIFICANT GROWTH   Final    Report  Status 05/05/2012 FINAL   Final   MRSA PCR SCREENING     Status: Normal   Collection Time   05/04/12 12:28 AM      Component Value Range Status Comment   MRSA by PCR NEGATIVE  NEGATIVE Final   URINE CULTURE     Status: Normal   Collection Time   05/04/12  4:15 AM      Component Value Range Status Comment   Specimen Description URINE, RANDOM   Final    Special Requests NONE   Final    Culture  Setup Time 05/04/2012 09:31   Final    Colony Count NO GROWTH   Final    Culture NO GROWTH   Final    Report Status 05/05/2012 FINAL   Final      Studies: Dg Chest 2 View  05/04/2012  *RADIOLOGY REPORT*  Clinical Data: Short of breath  CHEST - 2 VIEW  Comparison: 10/28/2009  Findings: Heart size and vascularity are normal.  Negative for pneumonia.  Lung volume is normal.  Negative for mass or effusion.  IMPRESSION: Negative   Original Report Authenticated By: Camelia Phenes, M.D.     Scheduled Meds:    . azithromycin  500 mg Oral Daily   Followed by  . azithromycin  250 mg Oral Daily  . ciprofloxacin  400 mg Intravenous Q12H  . feeding supplement  1 Container Oral TID WC  . guaiFENesin  1,200 mg Oral BID  . pantoprazole (PROTONIX) IV  40 mg Intravenous QHS  . potassium chloride  60 mEq Oral BID   Continuous Infusions:    . sodium chloride 100 mL/hr at 05/05/12 1610    Active Problems:  Nausea and vomiting    Time spent: 35 minutes    [facility] A  [facility] Pager [phone]. If 8PM-8AM, please contact night-coverage at [email or web address], password [facility] 05/05/2012, 1:10 PM  LOS: 2 days

## 2012-05-06 DIAGNOSIS — J209 Acute bronchitis, unspecified: Secondary | ICD-10-CM

## 2012-05-06 DIAGNOSIS — F1910 Other psychoactive substance abuse, uncomplicated: Secondary | ICD-10-CM

## 2012-05-06 DIAGNOSIS — R109 Unspecified abdominal pain: Secondary | ICD-10-CM | POA: Diagnosis present

## 2012-05-06 LAB — BASIC METABOLIC PANEL
BUN: 9 mg/dL (ref 6–23)
CO2: 26 mEq/L (ref 19–32)
Calcium: 9.8 mg/dL (ref 8.4–10.5)
Chloride: 99 mEq/L (ref 96–112)
Creatinine, Ser: 0.97 mg/dL (ref 0.50–1.35)
GFR calc Af Amer: >90 mL/min (ref >90)
GFR calc non Af Amer: >90 mL/min (ref >90)
Glucose, Bld: 88 mg/dL (ref 70–99)
Potassium: 3.7 mEq/L (ref 3.5–5.1)
Sodium: 135 mEq/L (ref 135–145)

## 2012-05-06 MED ORDER — METOCLOPRAMIDE HCL 5 MG/ML IJ SOLN
10.0000 mg | Freq: Four times a day (QID) | INTRAMUSCULAR | Status: DC | PRN
  Filled 2012-05-06: qty 2

## 2012-05-06 MED ORDER — HYDROCODONE-ACETAMINOPHEN 5-325 MG PO TABS: 1.0000 | ORAL_TABLET | Freq: Four times a day (QID) | ORAL | Status: DC | PRN

## 2012-05-06 MED ORDER — ONDANSETRON 8 MG PO TBDP: 8.0000 mg | ORAL_TABLET | Freq: Three times a day (TID) | ORAL | Status: DC | PRN

## 2012-05-06 MED ORDER — AZITHROMYCIN 250 MG PO TABS: 250.0000 mg | ORAL_TABLET | Freq: Every day | ORAL | Status: DC

## 2012-05-06 NOTE — Progress Notes (Signed)
05/06/12  Patient to be discharged home, IV site removed, Discharge instructions reviewed with Patient.

## 2012-05-06 NOTE — Discharge Summary (Signed)
Physician Discharge Summary  TOU HAYNER ZOX:[id number] DOB: [birth year] DOA: 05/03/2012  PCP: No primary provider on file.  Admit date: 05/03/2012 Discharge date: 05/06/2012  Recommendations for Outpatient Follow-up:  1. Followup with primary care physician  Discharge Diagnoses:  Principal Problem:  *Nausea and vomiting Active Problems:  CHOLECYSTECTOMY, HX OF  Abdominal pain  Polysubstance abuse  Acute bronchitis   Discharge Condition: Stable  Diet recommendation: Regular  Filed Weights   05/03/12 2317  Weight: 82.2 kg (181 lb 3.5 oz)    History of present illness:  31 year old male with history of chronic nausea vomiting abdominal pain who had undergone cholecystectomy last year for similar complaints presents with complaint of persistent nausea vomiting for the last 2 days. Patient also has been having right upper quadrant pain. Patient had gone to [facility] yesterday and had a CT abdomen pelvis which was unremarkable except for some free fluid and patient was discharged home on pain medications and antiemetics. Despite which patient still had nausea vomiting presented [facility]. Since patient has been having persistent nausea and vomiting with some right upper quadrant discomfort patient is admitted for further observation. Denies any fever chills chest pain shortness of breath diarrhea or any dysuria. Labs show mildly increased white cell count.   Hospital Course:    1. Nausea and vomiting: Patient has long-standing history of nausea and vomiting, with chronic abdominal pain. Patient was evaluated the night before admission and was long hospital the CT scan of abdomen/pelvis had was unremarkable except for some free fluids, patient was discharged home on pain medication and antiemetic and despite that he came back with nausea vomiting. Patient was treated symptomatically as transient gastritis with antiemetics, PPIs and IV fluid hydration.  Patient was able to tolerate regular diet without problems. But he was having nausea and vomiting after he swallowed pills. The patient sometimes refuses to take any medication except pain medicines. Because of the chronic abdominal pain and the intractable nausea and vomiting were done before and the recent CT scan, no workup done this time, as gastroenterology recommended from a previous consultation patient should follow with a tertiary center to provide further medical evaluation.   2. Abdominal pain: As mentioned above this is been going for a long time, he was evaluated by multiple CT scans, had previous endoscopy done and showed gastritis, it was unclear at that time if it's causing the vomiting or caused by the vomiting.  3. Acute bronchitis: Patient symptoms consistent with acute bronchitis, including shortness of breath, cough and sputum production. Patient is active smoker. Patient was started on azithromycin, inhaled bronchodilators and Mucinex. Patient was refusing the Mucinex, and mentioned that caused nausea.  4. Polysubstance abuse: He is UDS showed THC, he smokes tobacco. I counseled him extensively about marijuana, generally marijuana helps nausea but in some individuals can cause nausea and vomiting.  Prescription of 20 pills of Vicodin 5/325 was given to the patient.  Procedures:  None  Consultations:  None  Discharge Exam: Filed Vitals:   05/05/12 0617 05/05/12 1342 05/05/12 2129 05/06/12 0634  BP: 159/92 140/68 151/87 145/82  Pulse: 54 50 58 51  Temp: 99 F (37.2 C) 98 F (36.7 C) 100 F (37.8 C) 98.6 F (37 C)  TempSrc: Oral Oral Oral Oral  Resp: 18 18 20 16   Height:      Weight:      SpO2: 100% 100% 99% 99%   General: Alert and awake, oriented x3,  not in any acute distress. HEENT: anicteric sclera, pupils reactive to light and accommodation, EOMI CVS: S1-S2 clear, no murmur rubs or gallops Chest: clear to auscultation bilaterally, no wheezing, rales or  rhonchi Abdomen: soft nontender, nondistended, normal bowel sounds, no organomegaly Extremities: no cyanosis, clubbing or edema noted bilaterally Neuro: Cranial nerves II-XII intact, no focal neurological deficits  Discharge Instructions     Medication List     As of 05/06/2012 12:40 PM    TAKE these medications         azithromycin 250 MG tablet   Commonly known as: ZITHROMAX   Take 1 tablet (250 mg total) by mouth daily.      HYDROcodone-acetaminophen 5-325 MG per tablet   Commonly known as: NORCO/VICODIN   Take 1-2 tablets by mouth every 6 (six) hours as needed for pain.      ibuprofen 200 MG tablet   Commonly known as: ADVIL,MOTRIN   Take 400 mg by mouth every 6 (six) hours as needed. For pain      ondansetron 8 MG disintegrating tablet   Commonly known as: ZOFRAN-ODT   Take 1 tablet (8 mg total) by mouth every 8 (eight) hours as needed for nausea.           Follow-up Information    Follow up with Primary care physician. In 1 week.          The results of significant diagnostics from this hospitalization (including imaging, microbiology, ancillary and laboratory) are listed below for reference.    Significant Diagnostic Studies: Dg Chest 2 View  05/04/2012  *RADIOLOGY REPORT*  Clinical Data: Short of breath  CHEST - 2 VIEW  Comparison: 10/28/2009  Findings: Heart size and vascularity are normal.  Negative for pneumonia.  Lung volume is normal.  Negative for mass or effusion.  IMPRESSION: Negative   Original Report Authenticated By: Camelia Phenes, M.D.    Ct Abdomen Pelvis W Contrast  05/02/2012  *RADIOLOGY REPORT*  Clinical Data: Nausea and vomiting with increased weakness.  CT ABDOMEN AND PELVIS WITH CONTRAST  Technique:  Multidetector CT imaging of the abdomen and pelvis was performed following the standard protocol during bolus administration of intravenous contrast.  Contrast: OMNIPAQUE IOHEXOL 300 MG/ML  SOLN  Comparison: 02/20/2011.  Findings: Small  amount of free fluid the pelvis.  Source indeterminate.  No discrete bowel inflammatory process is noted. Evaluation of portions of bowel limited by under distension. No inflammation surrounds the appendix.  No free intraperitoneal air.  No focal hepatic, splenic, pancreatic, adrenal or renal lesion. Post cholecystectomy.  No abdominal aortic aneurysm.  No bony destructive lesion.  IMPRESSION: Small amount of free fluid the pelvis.  Source indeterminate. Please see above.   Original Report Authenticated By: Fuller Canada, M.D.     Microbiology: Recent Results (from the past 240 hour(s))  URINE CULTURE     Status: Normal   Collection Time   05/02/12  3:33 PM      Component Value Range Status Comment   Specimen Description URINE, CLEAN CATCH   Final    Special Requests NONE   Final    Culture  Setup Time 05/03/2012 04:50   Final    Colony Count 6,000 COLONIES/ML   Final    Culture INSIGNIFICANT GROWTH   Final    Report Status 05/05/2012 FINAL   Final   MRSA PCR SCREENING     Status: Normal   Collection Time   05/04/12 12:28 AM  Component Value Range Status Comment   MRSA by PCR NEGATIVE  NEGATIVE Final   URINE CULTURE     Status: Normal   Collection Time   05/04/12  4:15 AM      Component Value Range Status Comment   Specimen Description URINE, RANDOM   Final    Special Requests NONE   Final    Culture  Setup Time 05/04/2012 09:31   Final    Colony Count NO GROWTH   Final    Culture NO GROWTH   Final    Report Status 05/05/2012 FINAL   Final      Labs: Basic Metabolic Panel:  Lab 05/06/12 1610 05/05/12 1340 05/04/12 0640 05/03/12 2150 05/03/12 1514 05/02/12 1500  NA 135 136 141 -- 138 140  K 3.7 3.3* 3.4* -- 3.6 4.0  CL 99 98 105 -- 104 103  CO2 26 28 26  -- 25 24  GLUCOSE 88 86 105* -- 124* 168*  BUN 9 7 7  -- 8 9  CREATININE 0.97 0.93 0.91 -- 0.83 0.86  CALCIUM 9.8 10.0 9.5 -- 9.6 9.8  MG -- -- -- 1.8 -- --  PHOS -- -- -- -- -- --   Liver Function Tests:  Lab  05/04/12 0640 05/03/12 2150 05/02/12 1500  AST 16 16 19   ALT 16 13 17   ALKPHOS 69 66 80  BILITOT 1.6* 1.1 1.0  PROT 6.7 6.7 7.4  ALBUMIN 3.8 3.7 4.2    Lab 05/03/12 2150 05/02/12 1500  LIPASE 37 14  AMYLASE -- --   No results found for this basename: AMMONIA:5 in the last 168 hours CBC:  Lab 05/04/12 0640 05/03/12 1416 05/02/12 1500  WBC 9.6 12.1* 10.7*  NEUTROABS 7.7 -- --  HGB 12.1* 12.2* 13.6  HCT 36.6* 36.2* 40.4  MCV 93.1 92.8 93.1  PLT 200 172 232   Cardiac Enzymes: No results found for this basename: CKTOTAL:5,CKMB:5,CKMBINDEX:5,TROPONINI:5 in the last 168 hours BNP: BNP (last 3 results) No results found for this basename: PROBNP:3 in the last 8760 hours CBG: No results found for this basename: GLUCAP:5 in the last 168 hours  Time coordinating discharge: 40 minutes  Signed:  Carmel Waddington A  [facility] 05/06/2012, 12:40 PM

## 2012-05-07 ENCOUNTER — Encounter (HOSPITAL_COMMUNITY): Payer: Self-pay | Admitting: *Deleted

## 2012-05-07 ENCOUNTER — Emergency Department (HOSPITAL_COMMUNITY)
Admission: EM | Admit: 2012-05-07 | Discharge: 2012-05-07 | Disposition: A | Discharge status: Home / Self Care | Payer: Self-pay | Attending: Emergency Medicine | Admitting: Emergency Medicine

## 2012-05-07 DIAGNOSIS — R1011 Right upper quadrant pain: Secondary | ICD-10-CM | POA: Insufficient documentation

## 2012-05-07 DIAGNOSIS — R112 Nausea with vomiting, unspecified: Secondary | ICD-10-CM | POA: Insufficient documentation

## 2012-05-07 LAB — COMPREHENSIVE METABOLIC PANEL
ALT: 30 U/L (ref 0–53)
AST: 16 U/L (ref 0–37)
Albumin: 4.1 g/dL (ref 3.5–5.2)
Alkaline Phosphatase: 72 U/L (ref 39–117)
BUN: 13 mg/dL (ref 6–23)
CO2: 25 mEq/L (ref 19–32)
Calcium: 9.7 mg/dL (ref 8.4–10.5)
Chloride: 99 mEq/L (ref 96–112)
Creatinine, Ser: 0.9 mg/dL (ref 0.50–1.35)
GFR calc Af Amer: >90 mL/min (ref >90)
GFR calc non Af Amer: >90 mL/min (ref >90)
Glucose, Bld: 119 mg/dL — ABNORMAL HIGH (ref 70–99)
Potassium: 3.7 mEq/L (ref 3.5–5.1)
Sodium: 136 mEq/L (ref 135–145)
Total Bilirubin: 2.4 mg/dL — ABNORMAL HIGH (ref 0.3–1.2)
Total Protein: 7.4 g/dL (ref 6.0–8.3)

## 2012-05-07 LAB — CBC WITH DIFFERENTIAL/PLATELET
Basophils Absolute: 0 10*3/uL (ref 0.0–0.1)
Basophils Relative: 0 % (ref 0–1)
Eosinophils Absolute: 0 10*3/uL (ref 0.0–0.7)
Eosinophils Relative: 0 % (ref 0–5)
HCT: 40.1 % (ref 39.0–52.0)
Hemoglobin: 14.1 g/dL (ref 13.0–17.0)
Lymphocytes Relative: 15 % (ref 12–46)
Lymphs Abs: 0.9 10*3/uL (ref 0.7–4.0)
MCH: 31.5 pg (ref 26.0–34.0)
MCHC: 35.2 g/dL (ref 30.0–36.0)
MCV: 89.5 fL (ref 78.0–100.0)
Monocytes Absolute: 0.4 10*3/uL (ref 0.1–1.0)
Monocytes Relative: 6 % (ref 3–12)
Neutro Abs: 4.7 10*3/uL (ref 1.7–7.7)
Neutrophils Relative %: 78 % — ABNORMAL HIGH (ref 43–77)
Platelets: 275 10*3/uL (ref 150–400)
RBC: 4.48 MIL/uL (ref 4.22–5.81)
RDW: 12.2 % (ref 11.5–15.5)
WBC: 6 10*3/uL (ref 4.0–10.5)

## 2012-05-07 LAB — LIPASE, BLOOD: Lipase: 18 U/L (ref 11–59)

## 2012-05-07 MED ORDER — ONDANSETRON HCL 4 MG/2ML IJ SOLN
INTRAMUSCULAR | Status: AC
  Administered 2012-05-07: 13:00:00
  Filled 2012-05-07: qty 2

## 2012-05-07 MED ORDER — SODIUM CHLORIDE 0.9 % IV BOLUS (SEPSIS)
1000.0000 mL | Freq: Once | INTRAVENOUS | Status: AC
  Administered 2012-05-07: 1000 mL via INTRAVENOUS

## 2012-05-07 MED ORDER — ALBUTEROL SULFATE HFA 108 (90 BASE) MCG/ACT IN AERS: 1.0000 | INHALATION_SPRAY | Freq: Once | RESPIRATORY_TRACT | Status: DC

## 2012-05-07 MED ORDER — ONDANSETRON 4 MG PO TBDP
4.0000 mg | ORAL_TABLET | Freq: Once | ORAL | Status: AC
  Administered 2012-05-07: 4 mg via ORAL
  Filled 2012-05-07: qty 1

## 2012-05-07 MED ORDER — ALBUTEROL SULFATE HFA 108 (90 BASE) MCG/ACT IN AERS
INHALATION_SPRAY | RESPIRATORY_TRACT | Status: AC
  Filled 2012-05-07: qty 6.7

## 2012-05-07 MED ORDER — PANTOPRAZOLE SODIUM 40 MG IV SOLR
40.0000 mg | Freq: Once | INTRAVENOUS | Status: AC
Start: 2012-05-07 — End: 2012-05-07
  Administered 2012-05-07: 40 mg via INTRAVENOUS
  Filled 2012-05-07: qty 40

## 2012-05-07 MED ORDER — HYDROMORPHONE HCL PF 1 MG/ML IJ SOLN
1.0000 mg | Freq: Once | INTRAMUSCULAR | Status: AC
  Administered 2012-05-07: 1 mg via INTRAVENOUS
  Filled 2012-05-07: qty 1

## 2012-05-07 MED ORDER — HYDROMORPHONE HCL PF 1 MG/ML IJ SOLN
0.5000 mg | Freq: Once | INTRAMUSCULAR | Status: AC
  Administered 2012-05-07: 0.5 mg via INTRAVENOUS
  Filled 2012-05-07: qty 1

## 2012-05-07 NOTE — ED Provider Notes (Signed)
I saw and evaluated the patient, reviewed the resident's note and I agree with the findings and plan. Pt c/o upper abd pain and nv. Multiple prior evals for same incl ct yesterday. Persistent nv. Will rx symptoms, iv ns bolus.   Suzi Roots, MD 05/07/12 [phone]

## 2012-05-07 NOTE — Care Management Note (Signed)
    Page 1 of 1   05/07/2012     11:49:46 AM   CARE MANAGEMENT NOTE 05/07/2012  Patient:  Matthew Scott, Matthew Scott   Account Number:  [id number]  Date Initiated:  05/07/2012  Documentation initiated by:  Letha Cape  Subjective/Objective Assessment:   dx n/v  admit-lives with spouse.     Action/Plan:   Anticipated DC Date:  05/06/2012   Anticipated DC Plan:  HOME/SELF CARE         Choice offered to / List presented to:             Status of service:  Completed, signed off Medicare Important Message given?   (If response is "NO", the following Medicare IM given date fields will be blank) Date Medicare IM given:   Date Additional Medicare IM given:    Discharge Disposition:  HOME/SELF CARE  Per UR Regulation:  Reviewed for med. necessity/level of care/duration of stay  If discussed at Long Length of Stay Meetings, dates discussed:    Comments:  05/07/12 11:21 Letha Cape RN, BSN [phone] pt dc to home over w/e.

## 2012-05-07 NOTE — ED Provider Notes (Signed)
History     CSN: [id number]  Arrival date & time 05/07/12  1306   First MD Initiated Contact with Patient 05/07/12 1310      Chief Complaint  Patient presents with  . Abdominal Pain    (Consider location/radiation/quality/duration/timing/severity/associated sxs/prior treatment) HPI Comments: Matthew Scott 31 y.o. male   The chief complaint is: Patient presents with:   Abdominal Pain   The patient has medical history significant for:   Past Medical History:   Asthma                                                      Patient presents with RUQ pain that has been evaluated and worked for the same complain multiple times including yesterday. Patient states that the pain is 9/10, RUQ at the same site of his cholecystectomy 10 months ago. Prior visit did not determine the cause of his abdominal pain and he was encouraged to seek a specialist at [facility]. Denies fever or chills. Reports nausea and vomiting X 5 denies hematemesis or diarrhea. Denies CP or SOB.      The history is provided by the patient.    Past Medical History  Diagnosis Date  . Asthma     Past Surgical History  Procedure Date  . Cholecystectomy     Family History  Problem Relation Age of Onset  . Other Father     History  Substance Use Topics  . Smoking status: Current Every Day Smoker  . Smokeless tobacco: Not on file  . Alcohol Use: No      Review of Systems  Constitutional: Negative for fever and chills.  Respiratory: Negative for shortness of breath.   Cardiovascular: Negative for chest pain.  Gastrointestinal: Positive for nausea, vomiting and abdominal pain. Negative for diarrhea.  All other systems reviewed and are negative.    Allergies  Review of patient's allergies indicates no known allergies.  Home Medications   Current Outpatient Rx  Name Route Sig Dispense Refill  . AZITHROMYCIN 250 MG PO TABS Oral Take 1 tablet (250 mg total) by mouth daily. 4 each 0  .  HYDROCODONE-ACETAMINOPHEN 5-325 MG PO TABS Oral Take 1-2 tablets by mouth every 6 (six) hours as needed for pain. 20 tablet 0  . IBUPROFEN 200 MG PO TABS Oral Take 400 mg by mouth every 6 (six) hours as needed. For pain    . ONDANSETRON 8 MG PO TBDP Oral Take 1 tablet (8 mg total) by mouth every 8 (eight) hours as needed for nausea. 28 tablet 0    BP 157/100  Pulse 57  Temp 98.2 F (36.8 C) (Oral)  Resp 18  SpO2 100%  Physical Exam  Nursing note and vitals reviewed. Constitutional: He appears well-developed and well-nourished. He appears distressed.  HENT:  Head: Normocephalic and atraumatic.  Mouth/Throat: Oropharynx is clear and moist.  Eyes: Conjunctivae normal and EOM are normal. No scleral icterus.  Neck: Normal range of motion.  Cardiovascular: Regular rhythm and normal heart sounds.   Abdominal: Soft. Bowel sounds are normal. He exhibits no distension and no mass. There is tenderness. There is guarding. There is no rebound.       Patient tender to palpation of the RUQ with guarding.  Neurological: He is alert.  Skin: Skin is warm and dry.  ED Course  Procedures (including critical care time)  Labs Reviewed  CBC WITH DIFFERENTIAL - Abnormal; Notable for the following:    Neutrophils Relative 78 (*)     All other components within normal limits  COMPREHENSIVE METABOLIC PANEL - Abnormal; Notable for the following:    Glucose, Bld 119 (*)     Total Bilirubin 2.4 (*)     All other components within normal limits  LIPASE, BLOOD   Results for orders placed during the hospital encounter of 05/07/12  CBC WITH DIFFERENTIAL      Component Value Range   WBC 6.0  4.0 - 10.5 K/uL   RBC 4.48  4.22 - 5.81 MIL/uL   Hemoglobin 14.1  13.0 - 17.0 g/dL   HCT 81.1  91.4 - 78.2 %   MCV 89.5  78.0 - 100.0 fL   MCH 31.5  26.0 - 34.0 pg   MCHC 35.2  30.0 - 36.0 g/dL   RDW 95.6  21.3 - 08.6 %   Platelets 275  150 - 400 K/uL   Neutrophils Relative 78 (*) 43 - 77 %   Neutro Abs 4.7   1.7 - 7.7 K/uL   Lymphocytes Relative 15  12 - 46 %   Lymphs Abs 0.9  0.7 - 4.0 K/uL   Monocytes Relative 6  3 - 12 %   Monocytes Absolute 0.4  0.1 - 1.0 K/uL   Eosinophils Relative 0  0 - 5 %   Eosinophils Absolute 0.0  0.0 - 0.7 K/uL   Basophils Relative 0  0 - 1 %   Basophils Absolute 0.0  0.0 - 0.1 K/uL  COMPREHENSIVE METABOLIC PANEL      Component Value Range   Sodium 136  135 - 145 mEq/L   Potassium 3.7  3.5 - 5.1 mEq/L   Chloride 99  96 - 112 mEq/L   CO2 25  19 - 32 mEq/L   Glucose, Bld 119 (*) 70 - 99 mg/dL   BUN 13  6 - 23 mg/dL   Creatinine, Ser 5.78  0.50 - 1.35 mg/dL   Calcium 9.7  8.4 - 46.9 mg/dL   Total Protein 7.4  6.0 - 8.3 g/dL   Albumin 4.1  3.5 - 5.2 g/dL   AST 16  0 - 37 U/L   ALT 30  0 - 53 U/L   Alkaline Phosphatase 72  39 - 117 U/L   Total Bilirubin 2.4 (*) 0.3 - 1.2 mg/dL   GFR calc non Af Amer >90  >90 mL/min   GFR calc Af Amer >90  >90 mL/min  LIPASE, BLOOD      Component Value Range   Lipase 18  11 - 59 U/L    No results found.   1. Abdominal pain, acute, right upper quadrant   2. Nausea and vomiting       MDM  Patient presented with RUQ pain, nausea, and vomiting. Patient given dilaudid, zofran, and protonix with improvement. Patient able to tolerate PO. Labs all unremarkable as was a recent CT abdomen pelvis with contrast. Patient discharged with return precautions and recommendation to see a specialist. No red flags for peritonitis, pancreatitis, or acute abdomen.        Pixie Casino, PA-C 05/07/12 1943

## 2012-05-07 NOTE — ED Notes (Signed)
Pt arrives via EMS c/o RUQ abd pain, has hx of same was discharged from hospital for same thing. Pt had multiple tests with no diagnostic reason for abd pain. Pt was told to go to [facility] or [facility] to see a specialist.

## 2012-05-07 NOTE — ED Notes (Signed)
YNW:[id number]<OZ> Expected date:05/07/12<BR> Expected time:12:44 PM<BR> Means of arrival:Ambulance<BR> Comments:<BR> 35yoM, R side abd pain

## 2012-05-07 NOTE — ED Notes (Signed)
Patient given discharge instructions, information, prescriptions, and diet order. Patient states that they adequately understand discharge information given and to return to ED if symptoms return or worsen.     

## 2012-05-07 NOTE — Progress Notes (Signed)
Pt seen by [facility] community liasion and provided with resources Girlfriend at bedside Pt not vocalizing

## 2012-05-07 NOTE — ED Notes (Signed)
Pt given sips of water. Will reassess to see if pt tolerated.

## 2012-05-08 ENCOUNTER — Encounter (HOSPITAL_COMMUNITY): Payer: Self-pay | Admitting: *Deleted

## 2012-05-08 ENCOUNTER — Emergency Department (HOSPITAL_COMMUNITY)
Admission: EM | Admit: 2012-05-08 | Discharge: 2012-05-08 | Disposition: A | Discharge status: Home / Self Care | Payer: Self-pay | Attending: Emergency Medicine | Admitting: Emergency Medicine

## 2012-05-08 ENCOUNTER — Emergency Department (HOSPITAL_COMMUNITY): Payer: Self-pay

## 2012-05-08 DIAGNOSIS — Z9089 Acquired absence of other organs: Secondary | ICD-10-CM | POA: Insufficient documentation

## 2012-05-08 DIAGNOSIS — J45909 Unspecified asthma, uncomplicated: Secondary | ICD-10-CM | POA: Insufficient documentation

## 2012-05-08 DIAGNOSIS — R109 Unspecified abdominal pain: Secondary | ICD-10-CM

## 2012-05-08 DIAGNOSIS — R1110 Vomiting, unspecified: Secondary | ICD-10-CM | POA: Insufficient documentation

## 2012-05-08 DIAGNOSIS — R1011 Right upper quadrant pain: Secondary | ICD-10-CM | POA: Insufficient documentation

## 2012-05-08 LAB — CBC WITH DIFFERENTIAL/PLATELET
Basophils Absolute: 0 10*3/uL (ref 0.0–0.1)
Basophils Relative: 0 % (ref 0–1)
Eosinophils Absolute: 0 10*3/uL (ref 0.0–0.7)
Eosinophils Relative: 0 % (ref 0–5)
HCT: 39.5 % (ref 39.0–52.0)
Hemoglobin: 13.7 g/dL (ref 13.0–17.0)
Lymphocytes Relative: 21 % (ref 12–46)
Lymphs Abs: 1.4 10*3/uL (ref 0.7–4.0)
MCH: 31.4 pg (ref 26.0–34.0)
MCHC: 34.7 g/dL (ref 30.0–36.0)
MCV: 90.4 fL (ref 78.0–100.0)
Monocytes Absolute: 0.6 10*3/uL (ref 0.1–1.0)
Monocytes Relative: 9 % (ref 3–12)
Neutro Abs: 4.5 10*3/uL (ref 1.7–7.7)
Neutrophils Relative %: 70 % (ref 43–77)
Platelets: 274 10*3/uL (ref 150–400)
RBC: 4.37 MIL/uL (ref 4.22–5.81)
RDW: 12.3 % (ref 11.5–15.5)
WBC: 6.5 10*3/uL (ref 4.0–10.5)

## 2012-05-08 LAB — URINALYSIS, MICROSCOPIC ONLY
Bilirubin Urine: NEGATIVE
Glucose, UA: NEGATIVE mg/dL
Hgb urine dipstick: NEGATIVE
Ketones, ur: 15 mg/dL — AB
Leukocytes, UA: NEGATIVE
Nitrite: NEGATIVE
Protein, ur: NEGATIVE mg/dL
Specific Gravity, Urine: 1.021 (ref 1.005–1.030)
Urobilinogen, UA: 1 mg/dL (ref 0.0–1.0)
pH: 6.5 (ref 5.0–8.0)

## 2012-05-08 LAB — COMPREHENSIVE METABOLIC PANEL WITH GFR
ALT: 28 U/L (ref 0–53)
AST: 16 U/L (ref 0–37)
Albumin: 4.3 g/dL (ref 3.5–5.2)
Alkaline Phosphatase: 73 U/L (ref 39–117)
BUN: 9 mg/dL (ref 6–23)
CO2: 26 meq/L (ref 19–32)
Calcium: 9.7 mg/dL (ref 8.4–10.5)
Chloride: 99 meq/L (ref 96–112)
Creatinine, Ser: 0.94 mg/dL (ref 0.50–1.35)
GFR calc Af Amer: >90 mL/min
GFR calc non Af Amer: >90 mL/min
Glucose, Bld: 118 mg/dL — ABNORMAL HIGH (ref 70–99)
Potassium: 3.1 meq/L — ABNORMAL LOW (ref 3.5–5.1)
Sodium: 137 meq/L (ref 135–145)
Total Bilirubin: 3.1 mg/dL — ABNORMAL HIGH (ref 0.3–1.2)
Total Protein: 7.5 g/dL (ref 6.0–8.3)

## 2012-05-08 LAB — POCT I-STAT TROPONIN I: Troponin i, poc: 0 ng/mL (ref 0.00–0.08)

## 2012-05-08 LAB — LIPASE, BLOOD: Lipase: 27 U/L (ref 11–59)

## 2012-05-08 MED ORDER — ONDANSETRON HCL 4 MG/2ML IJ SOLN
4.0000 mg | Freq: Once | INTRAMUSCULAR | Status: AC
  Administered 2012-05-08: 4 mg via INTRAVENOUS
  Filled 2012-05-08: qty 2

## 2012-05-08 MED ORDER — POTASSIUM CHLORIDE CRYS ER 20 MEQ PO TBCR
40.0000 meq | EXTENDED_RELEASE_TABLET | Freq: Once | ORAL | Status: DC
  Filled 2012-05-08: qty 2

## 2012-05-08 MED ORDER — PANTOPRAZOLE SODIUM 40 MG PO TBEC: 40.0000 mg | DELAYED_RELEASE_TABLET | Freq: Every day | ORAL | Status: DC

## 2012-05-08 MED ORDER — ONDANSETRON HCL 4 MG/2ML IJ SOLN
4.0000 mg | Freq: Once | INTRAMUSCULAR | Status: AC
  Administered 2012-05-08: 4 mg via INTRAVENOUS

## 2012-05-08 MED ORDER — POTASSIUM CHLORIDE 10 MEQ/100ML IV SOLN
10.0000 meq | Freq: Once | INTRAVENOUS | Status: AC
  Administered 2012-05-08: 10 meq via INTRAVENOUS
  Filled 2012-05-08: qty 100

## 2012-05-08 MED ORDER — HYDROMORPHONE HCL PF 1 MG/ML IJ SOLN
1.0000 mg | Freq: Once | INTRAMUSCULAR | Status: AC
  Administered 2012-05-08: 1 mg via INTRAVENOUS
  Filled 2012-05-08: qty 1

## 2012-05-08 MED ORDER — ONDANSETRON HCL 4 MG/2ML IJ SOLN
INTRAMUSCULAR | Status: AC
  Filled 2012-05-08: qty 2

## 2012-05-08 MED ORDER — HYDROMORPHONE HCL PF 1 MG/ML IJ SOLN
1.0000 mg | Freq: Once | INTRAMUSCULAR | Status: AC
  Administered 2012-05-08: 1 mg via INTRAVENOUS
  Filled 2012-05-08 (×2): qty 1

## 2012-05-08 NOTE — ED Notes (Signed)
Explained to pt we needed urine sample. Pt unable to void at this time. Provided urinal.

## 2012-05-08 NOTE — ED Notes (Signed)
Pt is tolerating fluids  

## 2012-05-08 NOTE — ED Notes (Signed)
RUE:[id number]<JW> Expected date:<BR> Expected time:11:36 AM<BR> Means of arrival:Ambulance<BR> Comments:<BR> Ptar M N/V/right side pain

## 2012-05-08 NOTE — ED Notes (Signed)
Per ptar, pt from home, c/o n/v and right sided abd/flank pain that began today. Pt has no medical hx, nka. Initial pulse 52, b/p 142 palpated

## 2012-05-08 NOTE — ED Notes (Signed)
Pt c/o RLQ and RUQ abd pain, reports gallbladder was removed 9 months ago, has had n/v/ abd pain since. Has been treated for symptoms off and on over past year. Reports no diagnosis has been made.

## 2012-05-08 NOTE — ED Provider Notes (Signed)
History     CSN: [id number]  Arrival date & time 05/08/12  1147   First MD Initiated Contact with Patient 05/08/12 1235      Chief Complaint  Patient presents with  . Abdominal Pain  . Emesis    (Consider location/radiation/quality/duration/timing/severity/associated sxs/prior treatment) HPI Comments: Patient with history of chronic abdominal pain, right upper quadrant, previous cholecystectomy 9 months ago, recent hospitalization 09/19-09/22, ED visit yesterday -- presents with continued right upper quadrant pain, persistent nausea and vomiting. Patient states these are his typical symptoms and are not changed or different than from previous. Patient states he has seen 'flecks' of blood in his vomit at times. He states that his vomiting this morning has been yellow-green. Patient denies fever, chills, chest pain, shortness of breath, urinary symptoms. Patient has been having normal bowel movements. Stool is nonbloody. Patient has not been able to fill pain or nausea medications he received recently. Onset insidious. Course is constant. Nothing makes symptoms better or worse.  The history is provided by the patient.    Past Medical History  Diagnosis Date  . Asthma     Past Surgical History  Procedure Date  . Cholecystectomy     Family History  Problem Relation Age of Onset  . Other Father     History  Substance Use Topics  . Smoking status: Current Every Day Smoker  . Smokeless tobacco: Not on file  . Alcohol Use: No      Review of Systems  Constitutional: Negative for fever.  HENT: Negative for sore throat and rhinorrhea.   Eyes: Negative for redness.  Respiratory: Negative for cough.   Cardiovascular: Negative for chest pain.  Gastrointestinal: Positive for nausea, vomiting and abdominal pain. Negative for diarrhea.  Genitourinary: Negative for dysuria.  Musculoskeletal: Negative for myalgias.  Skin: Negative for rash.  Neurological: Negative for headaches.      Allergies  Review of patient's allergies indicates no known allergies.  Home Medications   Current Outpatient Rx  Name Route Sig Dispense Refill  . AZITHROMYCIN 250 MG PO TABS Oral Take 1 tablet (250 mg total) by mouth daily. 4 each 0  . HYDROCODONE-ACETAMINOPHEN 5-325 MG PO TABS Oral Take 1-2 tablets by mouth every 6 (six) hours as needed for pain. 20 tablet 0  . IBUPROFEN 200 MG PO TABS Oral Take 400 mg by mouth every 6 (six) hours as needed. For pain    . ONDANSETRON 8 MG PO TBDP Oral Take 1 tablet (8 mg total) by mouth every 8 (eight) hours as needed for nausea. 28 tablet 0    BP 162/78  Pulse 61  Temp 98.7 F (37.1 C) (Oral)  Resp 79  SpO2 100%  Physical Exam  Nursing note and vitals reviewed. Constitutional: He appears well-developed and well-nourished.  HENT:  Head: Normocephalic and atraumatic.  Eyes: Conjunctivae normal are normal. Right eye exhibits no discharge. Left eye exhibits no discharge.  Neck: Normal range of motion. Neck supple.  Cardiovascular: Normal rate, regular rhythm and normal heart sounds.   Pulmonary/Chest: Effort normal and breath sounds normal.  Abdominal: Soft. Bowel sounds are normal. He exhibits no distension. There is tenderness in the right upper quadrant, right lower quadrant and epigastric area. There is no rebound, no guarding and no CVA tenderness.    Neurological: He is alert.  Skin: Skin is warm and dry.  Psychiatric: He has a normal mood and affect.    ED Course  Procedures (including critical care time)  Labs Reviewed  COMPREHENSIVE METABOLIC PANEL - Abnormal; Notable for the following:    Potassium 3.1 (*)     Glucose, Bld 118 (*)     Total Bilirubin 3.1 (*)     All other components within normal limits  URINALYSIS, MICROSCOPIC ONLY - Abnormal; Notable for the following:    Ketones, ur 15 (*)     All other components within normal limits  CBC WITH DIFFERENTIAL  LIPASE, BLOOD  POCT I-STAT TROPONIN I   US Abdomen  Complete  05/08/2012  *RADIOLOGY REPORT*  Clinical Data:  Chronic right upper quadrant abdominal pain with rising total bilirubin levels.  History of cholecystectomy.  COMPLETE ABDOMINAL ULTRASOUND  Comparison:  Abdominal ultrasound 10/28/2009.  Abdominal CT 05/02/2012.  Findings:  Gallbladder: Surgically absent.  Common bile duct:   Normal in caliber without filling defects.  Liver:  There is a uniformly echogenic lesion anteriorly in the right hepatic lobe which measures 3.3 x 1.6 x 1.8 cm.  This is not clearly seen on the patient's prior studies, including the recent abdominal CT (although could correspond with focal fat adjacent to the falciform ligament on CT).  No other focal lesions are identified.  IVC:  Visualized portions appear unremarkable.  Pancreas:  Visualized portions appear unremarkable.  Spleen:  Visualized portions appear unremarkable.  Right Kidney:   The renal cortical thickness and echogenicity are preserved.  There is no hydronephrosis or focal abnormality. Renal length is 10.9 cm.  Left Kidney:   The renal cortical thickness and echogenicity are preserved.  There is no hydronephrosis or focal abnormality. Renal length is 10.1 cm.  Abdominal aorta:  Visualized portions appear unremarkable.  IMPRESSION:  1.  No biliary dilatation or acute finding status post cholecystectomy. 2.  Uniformly echogenic lesion in the anterior liver may correspond with focal fat adjacent to the falciform ligament on CT.   Original Report Authenticated By: Gerrianne Scale, M.D.      1. Abdominal pain     1:02 PM Patient seen and examined. Work-up initiated. Medications ordered. D/w Dr. Ranae Palms -- [place] ordered given Tbili trending upwards.   Vital signs reviewed and are as follows: Filed Vitals:   05/08/12 1151  BP: 162/78  Pulse: 61  Temp: 98.7 F (37.1 C)  Resp: 79   Patient informed of all results. He is tolerating PO's in room well. Pain is controlled. Urged patient to fill pain medications he  was recently prescribed.   Pt requests rx for protonix.  GI referral given.   The patient was urged to return to the Emergency Department immediately with worsening of current symptoms, worsening abdominal pain, persistent vomiting, blood noted in stools, fever, or any other concerns. The patient verbalized understanding.      MDM  Patient with recent admission for chronic abdominal pain, ED visit yesterday for same.  Workup today continues to be unremarkable except for total bilirubin trending higher. Ultrasound was performed and does not show obstruction.   Patient feels better after fluids, pain medication, antiemetics. Tolerating fluids well in room. He has eaten an apple. Discharge to home with GI followup. Patient urged to fill as previously prescribed pain medication.       Renne Crigler, [place] 05/08/12 2137

## 2012-05-09 ENCOUNTER — Emergency Department (HOSPITAL_COMMUNITY)
Admission: EM | Admit: 2012-05-09 | Discharge: 2012-05-10 | Disposition: A | Discharge status: Home / Self Care | Payer: Self-pay | Attending: Emergency Medicine | Admitting: Emergency Medicine

## 2012-05-09 ENCOUNTER — Encounter (HOSPITAL_COMMUNITY): Payer: Self-pay | Admitting: Family Medicine

## 2012-05-09 DIAGNOSIS — Z9089 Acquired absence of other organs: Secondary | ICD-10-CM | POA: Insufficient documentation

## 2012-05-09 DIAGNOSIS — R1011 Right upper quadrant pain: Secondary | ICD-10-CM | POA: Insufficient documentation

## 2012-05-09 DIAGNOSIS — F17200 Nicotine dependence, unspecified, uncomplicated: Secondary | ICD-10-CM | POA: Insufficient documentation

## 2012-05-09 DIAGNOSIS — G8929 Other chronic pain: Secondary | ICD-10-CM | POA: Insufficient documentation

## 2012-05-09 DIAGNOSIS — R112 Nausea with vomiting, unspecified: Secondary | ICD-10-CM | POA: Insufficient documentation

## 2012-05-09 NOTE — ED Notes (Signed)
JYN:[id number]<AO> Expected date:<BR> Expected time:<BR> Means of arrival:Ambulance<BR> Comments:<BR> 30yo male Abdominal Pain/Vomiting

## 2012-05-09 NOTE — ED Notes (Signed)
Per EMS, patient here for evaluation of abdominal pain. Was seen here yesterday for the same.

## 2012-05-09 NOTE — ED Provider Notes (Signed)
Medical screening examination/treatment/procedure(s) were conducted as a shared visit with non-physician practitioner(s) and myself.  I personally evaluated the patient during the encounter Pt with recurrent upper abd pain w nv. Emesis clear, not bloody or bilious. Several prior workups for same including recent ct neg for acute process. abd soft nt. nv improved.   Suzi Roots, MD 05/09/12 2141

## 2012-05-10 LAB — OCCULT BLOOD, POC DEVICE: Fecal Occult Bld: NEGATIVE

## 2012-05-10 MED ORDER — MORPHINE SULFATE 4 MG/ML IJ SOLN
4.0000 mg | Freq: Once | INTRAMUSCULAR | Status: AC
  Administered 2012-05-10: 4 mg via INTRAMUSCULAR
  Filled 2012-05-10: qty 1

## 2012-05-10 MED ORDER — PROMETHAZINE HCL 25 MG/ML IJ SOLN
25.0000 mg | Freq: Once | INTRAMUSCULAR | Status: AC
  Administered 2012-05-10: 25 mg via INTRAMUSCULAR
  Filled 2012-05-10: qty 1

## 2012-05-10 MED ORDER — PROMETHAZINE HCL 25 MG RE SUPP: 25.0000 mg | Freq: Four times a day (QID) | RECTAL | Status: DC | PRN

## 2012-05-10 MED ORDER — SUCRALFATE 1 G PO TABS: 1.0000 g | ORAL_TABLET | Freq: Four times a day (QID) | ORAL | Status: DC

## 2012-05-10 NOTE — ED Notes (Signed)
Patient states that he has been having abdominal pain and vomiting since yesterday. Was seen here yesterday for same. Was prescribed pain and nausea medication but did not get medication filled.

## 2012-05-10 NOTE — ED Notes (Signed)
Awaiting evaluation by ED provider.

## 2012-05-10 NOTE — ED Provider Notes (Signed)
Medical screening examination/treatment/procedure(s) were performed by non-physician practitioner and as supervising physician I was immediately available for consultation/collaboration.   Loren Racer, MD 05/10/12 [phone]

## 2012-05-10 NOTE — ED Notes (Signed)
Patient ambulated to bathroom; gait slow and steady.

## 2012-05-10 NOTE — ED Provider Notes (Signed)
History     CSN: [id number]  Arrival date & time 05/09/12  2240   First MD Initiated Contact with Patient 05/10/12 0256      Chief Complaint  Patient presents with  . Abdominal Pain    (Consider location/radiation/quality/duration/timing/severity/associated sxs/prior treatment) HPI History provided by pt.   Pt presents w/ chronic RUQ pain and N/V/D.  Has been experiencing these sx since cholecystectomy several months ago.  Has followed up with GI and they have referred him to a specialist in [facility].  Has not yet scheduled an appointment.    Reports occasional streaks of blood in vomit and stool.  Has also had increased urinary frequency.  Denies CP, SOB and fever.   Per prior chart, pt admitted for nausea and vomiting 9/19-9/22.  Has had an unremarkable CT and [place] of abd in the past week and all labs, including U/A have been normal.  Has had a endoscopy done in the past that showed gastritis.  Also has a h/o polysubstance abuse and was counseled on discontinuing use of THC, as this may be contributing to sx.  Was seen for same sx in ED yesterday and d/c'd home.  Has been prescribed protonix but has not yet started this medication because he is unable to tolerate anything by mouth, including his oral phenergan and zofran.   Past Medical History  Diagnosis Date  . Asthma     Past Surgical History  Procedure Date  . Cholecystectomy     Family History  Problem Relation Age of Onset  . Other Father     History  Substance Use Topics  . Smoking status: Current Every Day Smoker  . Smokeless tobacco: Not on file  . Alcohol Use: No      Review of Systems  All other systems reviewed and are negative.    Allergies  Review of patient's allergies indicates no known allergies.  Home Medications   Current Outpatient Rx  Name Route Sig Dispense Refill  . AZITHROMYCIN 250 MG PO TABS Oral Take 1 tablet (250 mg total) by mouth daily. 4 each 0  . HYDROCODONE-ACETAMINOPHEN 5-325  MG PO TABS Oral Take 1-2 tablets by mouth every 6 (six) hours as needed for pain. 20 tablet 0  . IBUPROFEN 200 MG PO TABS Oral Take 400 mg by mouth every 6 (six) hours as needed. For pain    . ONDANSETRON 8 MG PO TBDP Oral Take 1 tablet (8 mg total) by mouth every 8 (eight) hours as needed for nausea. 28 tablet 0  . PANTOPRAZOLE SODIUM 40 MG PO TBEC Oral Take 1 tablet (40 mg total) by mouth daily. 15 tablet 0    BP 157/84  Pulse 56  Temp 98.6 F (37 C) (Oral)  Resp 14  SpO2 92%  Physical Exam  Nursing note and vitals reviewed. Constitutional: He is oriented to person, place, and time. He appears well-developed and well-nourished. No distress.  HENT:  Head: Normocephalic and atraumatic.  Eyes:       Normal appearance  Neck: Normal range of motion.  Cardiovascular: Normal rate and regular rhythm.   Pulmonary/Chest: Effort normal and breath sounds normal. No respiratory distress.  Abdominal: Soft. Bowel sounds are normal. He exhibits no distension and no mass. There is no rebound and no guarding.       Moderate epigastric and RUQ ttp  Genitourinary:       No CVA tenderness  Musculoskeletal: Normal range of motion.  Neurological: He is alert and  oriented to person, place, and time.  Skin: Skin is warm and dry. No rash noted.  Psychiatric: He has a normal mood and affect. His behavior is normal.    ED Course  Procedures (including critical care time)  Labs Reviewed - No data to display US Abdomen Complete  05/08/2012  *RADIOLOGY REPORT*  Clinical Data:  Chronic right upper quadrant abdominal pain with rising total bilirubin levels.  History of cholecystectomy.  COMPLETE ABDOMINAL ULTRASOUND  Comparison:  Abdominal ultrasound 10/28/2009.  Abdominal CT 05/02/2012.  Findings:  Gallbladder: Surgically absent.  Common bile duct:   Normal in caliber without filling defects.  Liver:  There is a uniformly echogenic lesion anteriorly in the right hepatic lobe which measures 3.3 x 1.6 x 1.8  cm.  This is not clearly seen on the patient's prior studies, including the recent abdominal CT (although could correspond with focal fat adjacent to the falciform ligament on CT).  No other focal lesions are identified.  IVC:  Visualized portions appear unremarkable.  Pancreas:  Visualized portions appear unremarkable.  Spleen:  Visualized portions appear unremarkable.  Right Kidney:   The renal cortical thickness and echogenicity are preserved.  There is no hydronephrosis or focal abnormality. Renal length is 10.9 cm.  Left Kidney:   The renal cortical thickness and echogenicity are preserved.  There is no hydronephrosis or focal abnormality. Renal length is 10.1 cm.  Abdominal aorta:  Visualized portions appear unremarkable.  IMPRESSION:  1.  No biliary dilatation or acute finding status post cholecystectomy. 2.  Uniformly echogenic lesion in the anterior liver may correspond with focal fat adjacent to the falciform ligament on CT.   Original Report Authenticated By: Gerrianne Scale, M.D.      1. Chronic abdominal pain   2. Nausea and vomiting       MDM  31yo M presents w/ acute on chronic RUQ pain that has been occuring since cholecystectomy several months ago.  Has had a CT and [place] of abd in last week, as well as several labs, all of which have been unremarkable.  Per prior chart, had an endoscopy that showed gastritis in the past.  Recently prescribed protonix which he has not started d/t intractable N/V, and has been referred to GI in [facility] but has not yet scheduled an appt.  On exam, afebrile, well-appearing, epigastric and RUQ tenderness, nml rectum.  Only lab checked today was a hemoccult d/t c/o hematochezia and it was negative.  Had relief of sx w/ IM morphine and phenergan and is tolerating pos.  Advised to start the protonix and was prescribed carafate to supplement.  I also prescribed phenergan suppositories.  He will call GI in [facility] and return to the ER if there is a change  in his pain or he develops fever or uncontrolled vomiting.  He understands that there are no other tests that can be performed in the ED to evaluate him for this problem.          Otilio Miu, [place] 05/10/12 [phone]

## 2012-05-10 NOTE — ED Notes (Signed)
Family at desk inquiring when pt will be seen by physician

## 2012-05-10 NOTE — ED Provider Notes (Signed)
Medical screening examination/treatment/procedure(s) were performed by non-physician practitioner and as supervising physician I was immediately available for consultation/collaboration.   Hanley Seamen, MD 05/10/12 [phone]

## 2012-05-11 ENCOUNTER — Observation Stay (HOSPITAL_COMMUNITY)
Admission: EM | Admit: 2012-05-11 | Discharge: 2012-05-12 | Disposition: A | Discharge status: Home / Self Care | Payer: Self-pay | Attending: Family Medicine | Admitting: Family Medicine

## 2012-05-11 ENCOUNTER — Emergency Department (HOSPITAL_COMMUNITY): Payer: Self-pay

## 2012-05-11 ENCOUNTER — Encounter (HOSPITAL_COMMUNITY): Payer: Self-pay | Admitting: Emergency Medicine

## 2012-05-11 DIAGNOSIS — R1011 Right upper quadrant pain: Principal | ICD-10-CM | POA: Insufficient documentation

## 2012-05-11 DIAGNOSIS — K828 Other specified diseases of gallbladder: Secondary | ICD-10-CM

## 2012-05-11 DIAGNOSIS — R109 Unspecified abdominal pain: Secondary | ICD-10-CM

## 2012-05-11 DIAGNOSIS — F1910 Other psychoactive substance abuse, uncomplicated: Secondary | ICD-10-CM | POA: Diagnosis present

## 2012-05-11 DIAGNOSIS — K2990 Gastroduodenitis, unspecified, without bleeding: Secondary | ICD-10-CM

## 2012-05-11 DIAGNOSIS — K3184 Gastroparesis: Secondary | ICD-10-CM | POA: Diagnosis present

## 2012-05-11 DIAGNOSIS — Z9089 Acquired absence of other organs: Secondary | ICD-10-CM

## 2012-05-11 DIAGNOSIS — K2970 Gastritis, unspecified, without bleeding: Secondary | ICD-10-CM

## 2012-05-11 DIAGNOSIS — R112 Nausea with vomiting, unspecified: Secondary | ICD-10-CM | POA: Diagnosis present

## 2012-05-11 DIAGNOSIS — K5900 Constipation, unspecified: Secondary | ICD-10-CM | POA: Insufficient documentation

## 2012-05-11 DIAGNOSIS — A048 Other specified bacterial intestinal infections: Secondary | ICD-10-CM

## 2012-05-11 DIAGNOSIS — E876 Hypokalemia: Secondary | ICD-10-CM | POA: Diagnosis not present

## 2012-05-11 LAB — CBC WITH DIFFERENTIAL/PLATELET
Basophils Absolute: 0 10*3/uL (ref 0.0–0.1)
Basophils Relative: 0 % (ref 0–1)
Eosinophils Absolute: 0 10*3/uL (ref 0.0–0.7)
Eosinophils Relative: 0 % (ref 0–5)
HCT: 38.9 % — ABNORMAL LOW (ref 39.0–52.0)
Hemoglobin: 13.6 g/dL (ref 13.0–17.0)
Lymphocytes Relative: 14 % (ref 12–46)
Lymphs Abs: 0.9 10*3/uL (ref 0.7–4.0)
MCH: 31.9 pg (ref 26.0–34.0)
MCHC: 35 g/dL (ref 30.0–36.0)
MCV: 91.1 fL (ref 78.0–100.0)
Monocytes Absolute: 0.3 10*3/uL (ref 0.1–1.0)
Monocytes Relative: 4 % (ref 3–12)
Neutro Abs: 5.2 10*3/uL (ref 1.7–7.7)
Neutrophils Relative %: 82 % — ABNORMAL HIGH (ref 43–77)
Platelets: 295 10*3/uL (ref 150–400)
RBC: 4.27 MIL/uL (ref 4.22–5.81)
RDW: 12.6 % (ref 11.5–15.5)
WBC: 6.3 10*3/uL (ref 4.0–10.5)

## 2012-05-11 LAB — COMPREHENSIVE METABOLIC PANEL
ALT: 23 U/L (ref 0–53)
AST: 25 U/L (ref 0–37)
Albumin: 4.4 g/dL (ref 3.5–5.2)
Alkaline Phosphatase: 64 U/L (ref 39–117)
BUN: 10 mg/dL (ref 6–23)
CO2: 27 mEq/L (ref 19–32)
Calcium: 9.6 mg/dL (ref 8.4–10.5)
Chloride: 99 mEq/L (ref 96–112)
Creatinine, Ser: 0.89 mg/dL (ref 0.50–1.35)
GFR calc Af Amer: >90 mL/min (ref >90)
GFR calc non Af Amer: >90 mL/min (ref >90)
Glucose, Bld: 122 mg/dL — ABNORMAL HIGH (ref 70–99)
Potassium: 3.5 mEq/L (ref 3.5–5.1)
Sodium: 137 mEq/L (ref 135–145)
Total Bilirubin: 2.4 mg/dL — ABNORMAL HIGH (ref 0.3–1.2)
Total Protein: 7.3 g/dL (ref 6.0–8.3)

## 2012-05-11 LAB — URINALYSIS, ROUTINE W REFLEX MICROSCOPIC
Bilirubin Urine: NEGATIVE
Glucose, UA: NEGATIVE mg/dL
Hgb urine dipstick: NEGATIVE
Ketones, ur: 40 mg/dL — AB
Leukocytes, UA: NEGATIVE
Nitrite: NEGATIVE
Protein, ur: NEGATIVE mg/dL
Specific Gravity, Urine: 1.04 — ABNORMAL HIGH (ref 1.005–1.030)
Urobilinogen, UA: 1 mg/dL (ref 0.0–1.0)
pH: 6.5 (ref 5.0–8.0)

## 2012-05-11 LAB — RAPID URINE DRUG SCREEN, HOSP PERFORMED
Amphetamines: NOT DETECTED
Barbiturates: NOT DETECTED
Benzodiazepines: NOT DETECTED
Cocaine: NOT DETECTED
Opiates: NOT DETECTED
Tetrahydrocannabinol: POSITIVE — AB

## 2012-05-11 LAB — BILIRUBIN, FRACTIONATED(TOT/DIR/INDIR)
Bilirubin, Direct: 0.4 mg/dL — ABNORMAL HIGH (ref 0.0–0.3)
Indirect Bilirubin: 1.9 mg/dL — ABNORMAL HIGH (ref 0.3–0.9)
Total Bilirubin: 2.3 mg/dL — ABNORMAL HIGH (ref 0.3–1.2)

## 2012-05-11 LAB — LACTIC ACID, PLASMA: Lactic Acid, Venous: 1.5 mmol/L (ref 0.5–2.2)

## 2012-05-11 LAB — ETHANOL: Alcohol, Ethyl (B): <11 mg/dL (ref 0–11)

## 2012-05-11 LAB — LIPASE, BLOOD: Lipase: 43 U/L (ref 11–59)

## 2012-05-11 MED ORDER — METOCLOPRAMIDE HCL 5 MG/ML IJ SOLN
5.0000 mg | Freq: Four times a day (QID) | INTRAMUSCULAR | Status: DC
  Administered 2012-05-11 – 2012-05-12 (×4): 5 mg via INTRAVENOUS
  Filled 2012-05-11: qty 1
  Filled 2012-05-11: qty 2
  Filled 2012-05-11 (×4): qty 1

## 2012-05-11 MED ORDER — ENOXAPARIN SODIUM 40 MG/0.4ML ~~LOC~~ SOLN
40.0000 mg | SUBCUTANEOUS | Status: DC
  Administered 2012-05-11: 40 mg via SUBCUTANEOUS
  Filled 2012-05-11 (×2): qty 0.4

## 2012-05-11 MED ORDER — PANTOPRAZOLE SODIUM 40 MG IV SOLR
40.0000 mg | Freq: Once | INTRAVENOUS | Status: AC
  Administered 2012-05-11: 40 mg via INTRAVENOUS
  Filled 2012-05-11: qty 40

## 2012-05-11 MED ORDER — ACETAMINOPHEN 325 MG PO TABS
650.0000 mg | ORAL_TABLET | Freq: Four times a day (QID) | ORAL | Status: DC | PRN
  Administered 2012-05-12: 650 mg via ORAL
  Filled 2012-05-11: qty 2

## 2012-05-11 MED ORDER — PANTOPRAZOLE SODIUM 40 MG PO TBEC
40.0000 mg | DELAYED_RELEASE_TABLET | Freq: Every day | ORAL | Status: DC
  Administered 2012-05-12: 40 mg via ORAL
  Filled 2012-05-11: qty 1

## 2012-05-11 MED ORDER — SENNA 8.6 MG PO TABS
1.0000 | ORAL_TABLET | Freq: Two times a day (BID) | ORAL | Status: DC
  Administered 2012-05-11 – 2012-05-12 (×2): 8.6 mg via ORAL
  Filled 2012-05-11 (×2): qty 1

## 2012-05-11 MED ORDER — ALUM & MAG HYDROXIDE-SIMETH 200-200-20 MG/5ML PO SUSP: 30.0000 mL | Freq: Four times a day (QID) | ORAL | Status: DC | PRN

## 2012-05-11 MED ORDER — ONDANSETRON HCL 4 MG/2ML IJ SOLN: 4.0000 mg | Freq: Four times a day (QID) | INTRAMUSCULAR | Status: DC | PRN

## 2012-05-11 MED ORDER — ACETAMINOPHEN 650 MG RE SUPP: 650.0000 mg | Freq: Four times a day (QID) | RECTAL | Status: DC | PRN

## 2012-05-11 MED ORDER — ONDANSETRON HCL 4 MG PO TABS: 4.0000 mg | ORAL_TABLET | Freq: Four times a day (QID) | ORAL | Status: DC | PRN

## 2012-05-11 MED ORDER — SODIUM CHLORIDE 0.9 % IV SOLN
INTRAVENOUS | Status: DC
  Administered 2012-05-11: 21:00:00 via INTRAVENOUS

## 2012-05-11 MED ORDER — METOCLOPRAMIDE HCL 5 MG/ML IJ SOLN
10.0000 mg | Freq: Once | INTRAMUSCULAR | Status: AC
  Administered 2012-05-11: 10 mg via INTRAVENOUS
  Filled 2012-05-11: qty 2

## 2012-05-11 MED ORDER — PROMETHAZINE HCL 25 MG PO TABS: 25.0000 mg | ORAL_TABLET | Freq: Four times a day (QID) | ORAL | Status: DC | PRN

## 2012-05-11 MED ORDER — SODIUM CHLORIDE 0.9 % IV BOLUS (SEPSIS)
1000.0000 mL | Freq: Once | INTRAVENOUS | Status: AC
  Administered 2012-05-11: 1000 mL via INTRAVENOUS

## 2012-05-11 MED ORDER — DOCUSATE SODIUM 100 MG PO CAPS
100.0000 mg | ORAL_CAPSULE | Freq: Two times a day (BID) | ORAL | Status: DC
  Administered 2012-05-11 – 2012-05-12 (×2): 100 mg via ORAL
  Filled 2012-05-11 (×3): qty 1

## 2012-05-11 MED ORDER — ONDANSETRON HCL 4 MG/2ML IJ SOLN
4.0000 mg | Freq: Once | INTRAMUSCULAR | Status: AC
  Administered 2012-05-11: 4 mg via INTRAVENOUS
  Filled 2012-05-11: qty 2

## 2012-05-11 NOTE — ED Notes (Signed)
hospitalist at bedside

## 2012-05-11 NOTE — Progress Notes (Signed)
[facility] ED CM consulted by Dr Blake Divine about medication assistance Cm spoke with [facility] pharmacy staff to confirm pt is eligible for chs medication assistance Dr Blake Divine notified Dr Blake Divine will admit as observation for further tests

## 2012-05-11 NOTE — ED Notes (Signed)
Pt transported via bed with chart and personal belongings accompanied by Esmeralda Arthur, ED NT, condition stable at time of transfer.

## 2012-05-11 NOTE — H&P (Signed)
[facility] History and Physical  ROCH QUACH ZOX:[id number] DOB: [birth year] DOA: 05/11/2012   PCP: No primary provider on file.   Chief Complaint: abdominal pain since this morning.   HPI: Matthew Scott is a 31 y.o. male with h/o gastritis and gastroparesis, came in for right upper quadrant abdominal started at 9 am this morning, associated with nausea and dry heaving, intermittent , crampy to stabbing pain, severity of the pain right now is 3to 4/10. He also reports constipation. He has been admitted for similar complaints the last 3 times and GI consulted on the last admission. He had a gastric emptying study last June, was abnormal, but not on reglan. As per prior notes, he is a narcotic pain med abuser. He was recently discharged with pain medications, protonix and zithromax for bronchitis, but never filled them out for financial reasons. He also had his GB out one year ago, but reports persistent pain int he right upper quadrant. He underwent an EGD in the past and was told he had chronic gastritis with H PYLORI. He is currently admitted for observation and if his symptoms are not resolved he might need GI consultation in am with a repeat EGD to see if he has repeat H Pylori infection.   Review of Systems: The patient denies anorexia, fever, weight loss,, vision loss, decreased hearing, hoarseness, chest pain, syncope, dyspnea on exertion, peripheral edema, balance deficits, hemoptysis, , melena, hematochezia, , hematuria, incontinence, genital sores, muscle weakness, suspicious skin lesions, transient blindness, difficulty walking, depression, unusual weight change, abnormal bleeding, enlarged lymph nodes, angioedema, and breast masses.    Past Medical History  Diagnosis Date  . Asthma    Past Surgical History  Procedure Date  . Cholecystectomy    Social History:  reports that he has been smoking.  He does not have any smokeless tobacco history on file. He reports that he uses  illicit drugs (Marijuana). He reports that he does not drink alcohol.  where does patient live--home,  Can patient participate in ADLs yes  No Known Allergies  Family History  Problem Relation Age of Onset  . Other Father     Prior to Admission medications   Medication Sig Start Date End Date Taking? Authorizing Provider  acetaminophen (TYLENOL) 325 MG tablet Take 975 mg by mouth every 6 (six) hours as needed. Pain/fever   Yes Historical Provider, MD  promethazine (PHENERGAN) 25 MG tablet Take 25 mg by mouth every 6 (six) hours as needed. nausea   Yes Historical Provider, MD   Physical Exam: Filed Vitals:   05/11/12 1554 05/11/12 1806 05/11/12 1807 05/11/12 1808  BP: 140/83 127/59 130/67 126/68  Pulse: 58 62 100 84  Temp: 98.4 F (36.9 C)     TempSrc: Oral     Resp: 16     SpO2: 99%       Constitutional: Vital signs reviewed.  Patient is a well-developed and well-nourished  in no acute distress and cooperative with exam. Alert and oriented x3.  Head: Normocephalic and atraumatic Mouth: no erythema or exudates, MMM Eyes: PERRL, EOMI, conjunctivae normal, No scleral icterus.  Neck: Supple, Trachea midline normal ROM, No JVD, mass, thyromegaly, or carotid bruit present.  Cardiovascular: RRR, S1 normal, S2 normal, no MRG, pulses symmetric and intact bilaterally Pulmonary/Chest: CTAB, no wheezes, rales, or rhonchi Abdominal: Soft. Non-tender, non-distended, bowel sounds are normal, no masses, organomegaly, or guarding present.  GU: no CVA tenderness Musculoskeletal: No joint deformities, erythema, or stiffness, ROM full and  no nontender Hematology: no cervical, inginal, or axillary adenopathy.  Neurological: A&O x3, Strength is normal and symmetric bilaterally, cranial nerve II-XII are grossly intact, no focal motor deficit, sensory intact to light touch bilaterally.  Skin: Warm, dry and intact. No rash, cyanosis, or clubbing.  Psychiatric: Normal mood and affect. speech and  behavior is normal.   Labs on Admission:  Basic Metabolic Panel:  Lab 05/11/12 9528 05/08/12 1154 05/07/12 1410 05/06/12 0755 05/05/12 1340  NA 137 137 136 135 136  K 3.5 3.1* 3.7 3.7 3.3*  CL 99 99 99 99 98  CO2 27 26 25 26 28   GLUCOSE 122* 118* 119* 88 86  BUN 10 9 13 9 7   CREATININE 0.89 0.94 0.90 0.97 0.93  CALCIUM 9.6 9.7 9.7 9.8 10.0  MG -- -- -- -- --  PHOS -- -- -- -- --   Liver Function Tests:  Lab 05/11/12 1712 05/08/12 1154 05/07/12 1410  AST 25 16 16   ALT 23 28 30   ALKPHOS 64 73 72  BILITOT 2.4*2.3* 3.1* 2.4*  PROT 7.3 7.5 7.4  ALBUMIN 4.4 4.3 4.1    Lab 05/11/12 1712 05/08/12 1154 05/07/12 1410  LIPASE 43 27 18  AMYLASE -- -- --   No results found for this basename: AMMONIA:5 in the last 168 hours CBC:  Lab 05/11/12 1712 05/08/12 1154 05/07/12 1410  WBC 6.3 6.5 6.0  NEUTROABS 5.2 4.5 4.7  HGB 13.6 13.7 14.1  HCT 38.9* 39.5 40.1  MCV 91.1 90.4 89.5  PLT 295 274 275   Cardiac Enzymes: No results found for this basename: CKTOTAL:5,CKMB:5,CKMBINDEX:5,TROPONINI:5 in the last 168 hours  BNP (last 3 results) No results found for this basename: PROBNP:3 in the last 8760 hours CBG: No results found for this basename: GLUCAP:5 in the last 168 hours  Radiological Exams on Admission: Dg Abd Acute W/chest  05/11/2012  *RADIOLOGY REPORT*  Clinical Data: Persistent abdominal pain, nausea, vomiting and chills.  ACUTE ABDOMEN SERIES (ABDOMEN 2 VIEW & CHEST 1 VIEW)  Comparison: Acute abdominal series 03/03/2011.  Findings: Lung volumes are normal.  No consolidative airspace disease.  No pleural effusions.  No pneumothorax.  No pulmonary nodule or mass noted.  Pulmonary vasculature and the cardiomediastinal silhouette are within normal limits.  No pneumoperitoneum.  Supine and upright views of the abdomen demonstrate gas and stool scattered throughout the colon extending to the level of the distal rectum.  No pathologic distension of small bowel is noted. Surgical  clips project over the right upper quadrant of the abdomen, consistent with prior cholecystectomy.  IMPRESSION: 1.  Nonobstructive bowel gas pattern. 2.  No pneumoperitoneum. 3.  No radiographic evidence of acute cardiopulmonary disease. 4.  Status post cholecystectomy.   Original Report Authenticated By: Florencia Reasons, M.D.     EKG: pending.  Assessment/Plan Active Problems:  Nausea and vomiting  Polysubstance abuse  Gastroparesis   1. Right upper quadrant abdominal pain associated with nausea and vomiting: - probably from the gastroparesis and gastritis. - he has been coming to ED last 3 days for similar symptoms. - start him on iv protonix , one dose, followed by oral protonix in am - start him in IV reglan Q6hr - clear liquid diet - GI consult in am if symptoms doesn't resolve .   2. Gastroparesis : on IV reglan, which can be transitioned to oral reglan when symptoms improve  3. DVT prophylaxis  Code Status: full code Family Communication: girl friend at bedside Disposition Plan: home  when stable.    [facility] Pager [phone] If 7PM-7AM, please contact night-coverage [email or web address] Password [facility] 05/11/2012, 7:04 PM

## 2012-05-11 NOTE — ED Notes (Signed)
To ED via [facility] seen here every day this week with abd pain, worse today than before. Vomited x 8 today, no diarrhea. A/Ox3 , w/d, girlfriend at bediside

## 2012-05-11 NOTE — ED Provider Notes (Signed)
History     CSN: [id number]  Arrival date & time 05/11/12  1546   First MD Initiated Contact with Patient 05/11/12 1638      Chief Complaint  Patient presents with  . Abdominal Pain    HPI Mr. Corpus is a 31 yo man with PMH of chronic abdominal pain who comes in today for persistent nausea, non-bloody vomiting, and abdominal pain. He was admitted to [facility] on 9/19 and treated for complicated UTI. He has discharged with prescription for Zpack for possible bronchitis, phenergan tabs, Zofran, protonix, and Vicodin but was able to afford only phenergan and Vicodin. Her reports that the phenergan did not help him much and he has had decreased PO intake. The abdominal pain has been present since this morning and is described as localized to his RUQ, similar to the pain he has had before.   Past Medical History  Diagnosis Date  . Asthma     Past Surgical History  Procedure Date  . Cholecystectomy     Family History  Problem Relation Age of Onset  . Other Father     History  Substance Use Topics  . Smoking status: Current Every Day Smoker  . Smokeless tobacco: Not on file  . Alcohol Use: No      Review of Systems  Constitutional: Positive for chills and fatigue. Negative for fever and appetite change.  Respiratory: Negative for cough and shortness of breath.   Cardiovascular: Negative for chest pain.  Gastrointestinal: Positive for nausea, vomiting and abdominal pain. Negative for abdominal distention.  Genitourinary: Negative for dysuria and flank pain.  Neurological: Negative for dizziness.  Psychiatric/Behavioral: Negative for behavioral problems and agitation.    Allergies  Review of patient's allergies indicates no known allergies.  Home Medications   No current outpatient prescriptions on file.  BP 110/57  Pulse 91  Temp 99.3 F (37.4 C) (Oral)  Resp 18  Ht 5\' 8"  (1.727 m)  Wt 177 lb 14.6 oz (80.7 kg)  BMI 27.05 kg/m2  SpO2 98%  Physical Exam    Constitutional: He is oriented to person, place, and time. He appears well-nourished. No distress.       Bilious emesis during the exam.  HENT:  Head: Atraumatic.       Dry MM  Eyes: Conjunctivae normal are normal. Right eye exhibits no discharge. Left eye exhibits no discharge. No scleral icterus.  Neck: Neck supple.  Cardiovascular: Normal rate, regular rhythm, normal heart sounds and intact distal pulses.  Exam reveals no gallop and no friction rub.   No murmur heard. Pulmonary/Chest: Effort normal and breath sounds normal. No respiratory distress. He has no wheezes. He has no rales. He exhibits no tenderness.  Abdominal: Soft. Bowel sounds are normal. He exhibits no distension and no mass. There is tenderness. There is guarding. There is no rebound.       RUQ tenderness, voluntary guarding  Genitourinary:       Right CVA tenderness  Musculoskeletal: He exhibits no edema and no tenderness.  Neurological: He is alert and oriented to person, place, and time.  Skin: Skin is warm and dry. No rash noted. He is not diaphoretic. No erythema.  Psychiatric: He has a normal mood and affect.    ED Course  Procedures (including critical care time)  Labs Reviewed  URINALYSIS, ROUTINE W REFLEX MICROSCOPIC - Abnormal; Notable for the following:    Color, Urine AMBER (*)  BIOCHEMICALS MAY BE AFFECTED BY COLOR   APPearance CLOUDY (*)  Specific Gravity, Urine 1.040 (*)     Ketones, ur 40 (*)     All other components within normal limits  CBC WITH DIFFERENTIAL - Abnormal; Notable for the following:    HCT 38.9 (*)     Neutrophils Relative 82 (*)     All other components within normal limits  COMPREHENSIVE METABOLIC PANEL - Abnormal; Notable for the following:    Glucose, Bld 122 (*)     Total Bilirubin 2.4 (*)     All other components within normal limits  BILIRUBIN, FRACTIONATED(TOT/DIR/INDIR) - Abnormal; Notable for the following:    Total Bilirubin 2.3 (*)     Bilirubin, Direct 0.4  (*)     Indirect Bilirubin 1.9 (*)     All other components within normal limits  URINE RAPID DRUG SCREEN (HOSP PERFORMED) - Abnormal; Notable for the following:    Tetrahydrocannabinol POSITIVE (*)     All other components within normal limits  LACTIC ACID, PLASMA  LIPASE, BLOOD  ETHANOL  COMPREHENSIVE METABOLIC PANEL  CBC   Dg Abd Acute W/chest  05/11/2012  *RADIOLOGY REPORT*  Clinical Data: Persistent abdominal pain, nausea, vomiting and chills.  ACUTE ABDOMEN SERIES (ABDOMEN 2 VIEW & CHEST 1 VIEW)  Comparison: Acute abdominal series 03/03/2011.  Findings: Lung volumes are normal.  No consolidative airspace disease.  No pleural effusions.  No pneumothorax.  No pulmonary nodule or mass noted.  Pulmonary vasculature and the cardiomediastinal silhouette are within normal limits.  No pneumoperitoneum.  Supine and upright views of the abdomen demonstrate gas and stool scattered throughout the colon extending to the level of the distal rectum.  No pathologic distension of small bowel is noted. Surgical clips project over the right upper quadrant of the abdomen, consistent with prior cholecystectomy.  IMPRESSION: 1.  Nonobstructive bowel gas pattern. 2.  No pneumoperitoneum. 3.  No radiographic evidence of acute cardiopulmonary disease. 4.  Status post cholecystectomy.   Original Report Authenticated By: Florencia Reasons, M.D.      1. Gastroparesis   2. Abdominal pain   3. Nausea and vomiting   4. Polysubstance abuse       MDM  31 yo man with acute on chronic abdominal pain, was admitted on 9/19, treated for complicated UTI with cipro, urine culture negative. He is afebrile today, with mild bradycardia, dry MM, UA today showing ketones 40 from 15 on 9/24 and specific gravity of 1.040, consistent with decreased PO intake.   Plan: NS IV 1L bolus Reglan Zofran CMP CBC Acute abdomen series negative  Dispo. Pt to be admitted for failed OP management of persistent N/V, decreased PO intake  with increase urine specific gravity, ketones. He will need IV hydration, IV medication. Pt will need medication assistance for outpatient management.          Ky Barban, MD 05/11/12 [phone]

## 2012-05-11 NOTE — ED Notes (Signed)
Abdominal RT upper and lower pain x few days.  Has been here and worked up for same but pain continues to worsen.  Has n/v/d and decreased appetite.  Also c/o body aches.  Unsure of fever.

## 2012-05-12 DIAGNOSIS — E876 Hypokalemia: Secondary | ICD-10-CM

## 2012-05-12 LAB — COMPREHENSIVE METABOLIC PANEL
ALT: 17 U/L (ref 0–53)
AST: 16 U/L (ref 0–37)
Albumin: 3.4 g/dL — ABNORMAL LOW (ref 3.5–5.2)
Alkaline Phosphatase: 52 U/L (ref 39–117)
BUN: 8 mg/dL (ref 6–23)
CO2: 28 mEq/L (ref 19–32)
Calcium: 9.3 mg/dL (ref 8.4–10.5)
Chloride: 103 mEq/L (ref 96–112)
Creatinine, Ser: 0.92 mg/dL (ref 0.50–1.35)
GFR calc Af Amer: >90 mL/min (ref >90)
GFR calc non Af Amer: >90 mL/min (ref >90)
Glucose, Bld: 89 mg/dL (ref 70–99)
Potassium: 3.1 mEq/L — ABNORMAL LOW (ref 3.5–5.1)
Sodium: 138 mEq/L (ref 135–145)
Total Bilirubin: 2.7 mg/dL — ABNORMAL HIGH (ref 0.3–1.2)
Total Protein: 5.9 g/dL — ABNORMAL LOW (ref 6.0–8.3)

## 2012-05-12 LAB — CBC
HCT: 35.3 % — ABNORMAL LOW (ref 39.0–52.0)
Hemoglobin: 12.2 g/dL — ABNORMAL LOW (ref 13.0–17.0)
MCH: 31.6 pg (ref 26.0–34.0)
MCHC: 34.6 g/dL (ref 30.0–36.0)
MCV: 91.5 fL (ref 78.0–100.0)
Platelets: 283 10*3/uL (ref 150–400)
RBC: 3.86 MIL/uL — ABNORMAL LOW (ref 4.22–5.81)
RDW: 12.6 % (ref 11.5–15.5)
WBC: 9.7 10*3/uL (ref 4.0–10.5)

## 2012-05-12 MED ORDER — POTASSIUM CHLORIDE CRYS ER 20 MEQ PO TBCR
40.0000 meq | EXTENDED_RELEASE_TABLET | Freq: Once | ORAL | Status: AC
  Administered 2012-05-12: 40 meq via ORAL
  Filled 2012-05-12: qty 2

## 2012-05-12 MED ORDER — DSS 100 MG PO CAPS: 100.0000 mg | ORAL_CAPSULE | Freq: Two times a day (BID) | ORAL | Status: DC

## 2012-05-12 MED ORDER — OMEPRAZOLE 20 MG PO TBEC: 1.0000 | DELAYED_RELEASE_TABLET | Freq: Every day | ORAL | Status: DC

## 2012-05-12 MED ORDER — PROMETHAZINE HCL 25 MG PO TABS: 25.0000 mg | ORAL_TABLET | Freq: Four times a day (QID) | ORAL | Status: DC | PRN

## 2012-05-12 MED ORDER — INFLUENZA VIRUS VACC SPLIT PF IM SUSP: 0.5000 mL | INTRAMUSCULAR | Status: DC

## 2012-05-12 NOTE — Care Management Note (Signed)
    Page 1 of 1   05/12/2012     4:27:42 PM   CARE MANAGEMENT NOTE 05/12/2012  Patient:  Matthew Scott, Matthew Scott   Account Number:  [id number]  Date Initiated:  05/12/2012  Documentation initiated by:  Konrad Felix  Subjective/Objective Assessment:   Patient admitted with right upper quadrant pain.     Action/Plan:   Patient to be discharged to home when medically stable.   Anticipated DC Date:  05/12/2012   Anticipated DC Plan:  HOME/SELF CARE      DC Planning Services  CM consult  Medication Assistance      Choice offered to / List presented to:             Status of service:  Completed, signed off Medicare Important Message given?   (If response is "NO", the following Medicare IM given date fields will be blank) Date Medicare IM given:   Date Additional Medicare IM given:    Discharge Disposition:  HOME/SELF CARE  Per UR Regulation:  Reviewed for med. necessity/level of care/duration of stay  If discussed at Long Length of Stay Meetings, dates discussed:    Comments:  05/12/2012  Konrad Felix RN, case manager   [phone] Consulted to provide medication assistance to patient. Although pre-determined that he was eligible for the ZZ-fund, patient has chosed to pay for his meds, because made sure to choose those from the $4 list. Patient will also be going Monday morning to apply for his orange card.

## 2012-05-12 NOTE — Progress Notes (Signed)
Patient given all discharge instructions, understanding expressed throughout teach back. All "mychart" instructions explained to patient. Discharged home with family.

## 2012-05-12 NOTE — ED Provider Notes (Signed)
I saw and evaluated the patient, reviewed the resident's note and I agree with the findings and plan.   .Face to face Exam:  General:  Awake HEENT:  Atraumatic Resp:  Normal effort Abd:  Nondistended Neuro:No focal weakness Lymph: No adenopathy   Nelia Shi, MD 05/12/12 [phone]

## 2012-05-12 NOTE — Discharge Summary (Signed)
Physician Discharge Summary  Patient ID: Matthew Scott MRN: [id number] DOB/AGE: 11-23-1980 31 y.o.  Admit date: 05/11/2012 Discharge date: 05/12/2012  Discharge Diagnoses:  Active Problems:  Nausea and vomiting - resolved  Polysubstance abuse  Gastroparesis  Hypokalemia - treated  Discharged Condition: good  Hospital Course:  From HPI:  Matthew Scott is a 31 y.o. male with h/o gastritis and gastroparesis, came in for right upper quadrant abdominal started at 9 am this morning, associated with nausea and dry heaving, intermittent , crampy to stabbing pain, severity of the pain right now is 3to 4/10. He also reports constipation. He has been admitted for similar complaints the last 3 times and GI consulted on the last admission. He had a gastric emptying study last June, was abnormal, but not on reglan. As per prior notes, he is a narcotic pain med abuser. He was recently discharged with pain medications, protonix and zithromax for bronchitis, but never filled them out for financial reasons. He also had his GB out one year ago, but reports persistent pain int he right upper quadrant. He underwent an EGD in the past and was told he had chronic gastritis with H PYLORI. He is currently admitted for observation and if his symptoms are not resolved he might need GI consultation in am with a repeat EGD to see if he has repeat H Pylori infection.  Pt was given IVFs and nausea meds.  I came to see him on 9/28 in am and found him eating a regular heavy diet that his partner had brought in and pt says he tolerated it very well and had no pain, nausea or vomiting.  He said that he wanted to go home.  He said that he would follow up with Dr Dulce Sellar his gastroenterologist and establish primary care with [facility] and [facility].    Discharge Exam: Pt without complaints.  Asking to go home. Eating regular diet with no complaints.  Blood pressure 130/66, pulse 61, temperature 98.2 F (36.8  C), temperature source Oral, resp. rate 17, height 5\' 8"  (1.727 m), weight 80.7 kg (177 lb 14.6 oz), SpO2 99.00%. General - awake, alert, no distress Heent - NCAT MMM Lungs - clear bilateral CV - normal s1, s2 sounds Abd - soft, nondistended, nontender, no masses palpated,normal BS Ext - no CCE  Disposition: 01-Home or Self Care  Discharge Orders    Future Orders Please Complete By Expires   Increase activity slowly      Discharge instructions      Comments:   High fiber diet recommended Get established with a primary care physician as soon as possible, Return or seek medical care if symptoms return, worsen or new problems develop.  See your stomach doctor for recheck in 3-4 weeks       Medication List     As of 05/12/2012 12:39 PM    TAKE these medications         acetaminophen 325 MG tablet   Commonly known as: TYLENOL   Take 975 mg by mouth every 6 (six) hours as needed. Pain/fever      DSS 100 MG Caps   Take 100 mg by mouth 2 (two) times daily.      Omeprazole 20 MG Tbec   Take 1 tablet (20 mg total) by mouth daily.      promethazine 25 MG tablet   Commonly known as: PHENERGAN   Take 1 tablet (25 mg total) by mouth every 6 (six)  hours as needed for nausea. nausea           Follow-up Information    Follow up with [facility] - in clinic. Schedule an appointment as soon as possible for a visit in 1 week. [facility] Follow Up)       Follow up with Freddy Jaksch, MD. Schedule an appointment as soon as possible for a visit in 1 month. (follow up abdomen)    Contact information:   [address] [place] [address] [place] [address] [phone]         Signed: Theodosia Quay JohnsonMD [facility] [place], [place] 05/12/2012, 12:39 PM

## 2012-05-12 NOTE — Progress Notes (Signed)
I went in the room to examine patient and found him eating regular diet with his partner.  He says that he feels better and having no n/v/ or abd pain and wanted to go home.  He requested to speak with care manager for assistance with meds, etc.   Maryln Manuel, MD

## 2012-08-09 ENCOUNTER — Encounter (HOSPITAL_COMMUNITY): Payer: Self-pay | Admitting: Emergency Medicine

## 2012-08-09 ENCOUNTER — Emergency Department (HOSPITAL_COMMUNITY)
Admission: EM | Admit: 2012-08-09 | Discharge: 2012-08-09 | Disposition: A | Discharge status: Home / Self Care | Payer: Self-pay | Attending: Emergency Medicine | Admitting: Emergency Medicine

## 2012-08-09 ENCOUNTER — Emergency Department (HOSPITAL_COMMUNITY): Payer: Self-pay

## 2012-08-09 DIAGNOSIS — J45909 Unspecified asthma, uncomplicated: Secondary | ICD-10-CM | POA: Insufficient documentation

## 2012-08-09 DIAGNOSIS — F17200 Nicotine dependence, unspecified, uncomplicated: Secondary | ICD-10-CM | POA: Insufficient documentation

## 2012-08-09 DIAGNOSIS — R300 Dysuria: Secondary | ICD-10-CM | POA: Insufficient documentation

## 2012-08-09 DIAGNOSIS — M549 Dorsalgia, unspecified: Secondary | ICD-10-CM | POA: Insufficient documentation

## 2012-08-09 DIAGNOSIS — R05 Cough: Secondary | ICD-10-CM | POA: Insufficient documentation

## 2012-08-09 DIAGNOSIS — J029 Acute pharyngitis, unspecified: Secondary | ICD-10-CM | POA: Insufficient documentation

## 2012-08-09 DIAGNOSIS — R6889 Other general symptoms and signs: Secondary | ICD-10-CM | POA: Insufficient documentation

## 2012-08-09 DIAGNOSIS — Z202 Contact with and (suspected) exposure to infections with a predominantly sexual mode of transmission: Secondary | ICD-10-CM | POA: Insufficient documentation

## 2012-08-09 DIAGNOSIS — J069 Acute upper respiratory infection, unspecified: Secondary | ICD-10-CM | POA: Insufficient documentation

## 2012-08-09 DIAGNOSIS — R059 Cough, unspecified: Secondary | ICD-10-CM | POA: Insufficient documentation

## 2012-08-09 DIAGNOSIS — Z79899 Other long term (current) drug therapy: Secondary | ICD-10-CM | POA: Insufficient documentation

## 2012-08-09 DIAGNOSIS — F1210 Cannabis abuse, uncomplicated: Secondary | ICD-10-CM | POA: Insufficient documentation

## 2012-08-09 LAB — URINALYSIS, ROUTINE W REFLEX MICROSCOPIC
Glucose, UA: NEGATIVE mg/dL
Hgb urine dipstick: NEGATIVE
Nitrite: NEGATIVE
Protein, ur: 30 mg/dL — AB
Specific Gravity, Urine: 1.04 — ABNORMAL HIGH (ref 1.005–1.030)
Urobilinogen, UA: 1 mg/dL (ref 0.0–1.0)
pH: 6 (ref 5.0–8.0)

## 2012-08-09 LAB — URINE MICROSCOPIC-ADD ON

## 2012-08-09 MED ORDER — CEFTRIAXONE SODIUM 250 MG IJ SOLR
250.0000 mg | Freq: Once | INTRAMUSCULAR | Status: AC
  Administered 2012-08-09: 250 mg via INTRAMUSCULAR
  Filled 2012-08-09: qty 250

## 2012-08-09 MED ORDER — LIDOCAINE HCL 2 % IJ SOLN
INTRAMUSCULAR | Status: AC
  Administered 2012-08-09: 20 mg
  Filled 2012-08-09: qty 20

## 2012-08-09 MED ORDER — AZITHROMYCIN 250 MG PO TABS
1000.0000 mg | ORAL_TABLET | Freq: Once | ORAL | Status: AC
  Administered 2012-08-09: 1000 mg via ORAL
  Filled 2012-08-09: qty 1

## 2012-08-09 NOTE — ED Provider Notes (Signed)
History     CSN: [id number]  Arrival date & time 08/09/12  1478   First MD Initiated Contact with Patient 08/09/12 [phone]      Chief Complaint  Patient presents with  . Penile Discharge    burning on urination x 1 week  . Back Pain    intermittent pain x 1 month    (Consider location/radiation/quality/duration/timing/severity/associated sxs/prior treatment) HPI Patient presents to the emergency department, stating that his girlfriend was diagnosed with Chlamydia and that he has discomfort with urination.  Patient, states that he's also had sore throat, runny nose, cough, and chills for the last 3 days.  Patient has some discomfort when he coughs.  Patient denies nausea, vomiting abdominal pain, weakness, chest pain, shortness of breath, or diarrhea.  Patient denies taking anything prior to arrival, for his symptoms       Past Medical History  Diagnosis Date  . Asthma     Past Surgical History  Procedure Date  . Cholecystectomy     Family History  Problem Relation Age of Onset  . Other Father   . Diabetes Father   . Hypertension Father     History  Substance Use Topics  . Smoking status: Current Every Day Smoker  . Smokeless tobacco: Not on file  . Alcohol Use: Yes      Review of Systems All other systems negative except as documented in the HPI. All pertinent positives and negatives as reviewed in the HPI.  Allergies  Review of patient's allergies indicates no known allergies.  Home Medications   Current Outpatient Rx  Name  Route  Sig  Dispense  Refill  . ACETAMINOPHEN 325 MG PO TABS   Oral   Take 975 mg by mouth every 6 (six) hours as needed. Pain/fever         . IBUPROFEN 200 MG PO TABS   Oral   Take 200 mg by mouth every 6 (six) hours as needed.           BP 131/75  Pulse 78  Temp 100 F (37.8 C) (Oral)  Resp 20  SpO2 98%  Physical Exam  Nursing note and vitals reviewed. Constitutional: He appears well-developed and  well-nourished. No distress.  HENT:  Head: Normocephalic and atraumatic.  Mouth/Throat: Oropharynx is clear and moist.  Eyes: Pupils are equal, round, and reactive to light.  Neck: Normal range of motion. Neck supple.  Cardiovascular: Normal rate, regular rhythm and normal heart sounds.  Exam reveals no gallop and no friction rub.   No murmur heard. Pulmonary/Chest: Effort normal and breath sounds normal. No respiratory distress. He has no wheezes.  Abdominal: Soft. Bowel sounds are normal. There is no tenderness.  Genitourinary: Testes normal. No penile erythema or penile tenderness. No discharge found.  Skin: Skin is warm and dry. No rash noted.    ED Course  Procedures (including critical care time)  Labs Reviewed  URINALYSIS, ROUTINE W REFLEX MICROSCOPIC - Abnormal; Notable for the following:    Color, Urine ORANGE (*)  BIOCHEMICALS MAY BE AFFECTED BY COLOR   APPearance CLOUDY (*)     Specific Gravity, Urine 1.040 (*)     Bilirubin Urine SMALL (*)     Ketones, ur TRACE (*)     Protein, ur 30 (*)     Leukocytes, UA MODERATE (*)     All other components within normal limits  URINE MICROSCOPIC-ADD ON - Abnormal; Notable for the following:    Bacteria, UA FEW (*)  All other components within normal limits  GC/CHLAMYDIA PROBE AMP  URINE CULTURE   Dg Chest 2 View  08/09/2012  *RADIOLOGY REPORT*  Clinical Data: Cough and body aches.  Fever.  CHEST - 2 VIEW  Comparison: 05/04/2012  Findings: Normal heart size.  Clear lungs.  No pneumothorax.  No pleural effusion.  IMPRESSION: No active cardiopulmonary disease.   Original Report Authenticated By: Jolaine Click, M.D.    Patient, she did, chest x-ray, and her therapies as chills, and cough, with discomfort.  Patient, is stable and be treated for presumed Chlamydia.  Patient, is advised return here at any worsening in his condition     MDM          Carlyle Dolly, PA-C 08/09/12 1121

## 2012-08-09 NOTE — ED Provider Notes (Signed)
Medical screening examination/treatment/procedure(s) were performed by non-physician practitioner and as supervising physician I was immediately available for consultation/collaboration.   Lyanne Co, MD 08/09/12 [phone]

## 2012-08-09 NOTE — ED Notes (Signed)
Pt reports penile discharge and pain on urination C/o low back pain x 1 month Pt reports intermittent chills x one month Denies cough, lungs clear bilat.

## 2012-08-10 LAB — GC/CHLAMYDIA PROBE AMP
CT Probe RNA: NEGATIVE
GC Probe RNA: NEGATIVE

## 2012-08-10 LAB — URINE CULTURE
Colony Count: NO GROWTH
Culture: NO GROWTH

## 2013-01-08 ENCOUNTER — Encounter (HOSPITAL_COMMUNITY): Payer: Self-pay | Admitting: Emergency Medicine

## 2013-01-08 ENCOUNTER — Emergency Department (HOSPITAL_COMMUNITY)
Admission: EM | Admit: 2013-01-08 | Discharge: 2013-01-08 | Disposition: A | Discharge status: Home / Self Care | Payer: Self-pay | Attending: Emergency Medicine | Admitting: Emergency Medicine

## 2013-01-08 DIAGNOSIS — K0889 Other specified disorders of teeth and supporting structures: Secondary | ICD-10-CM

## 2013-01-08 DIAGNOSIS — K089 Disorder of teeth and supporting structures, unspecified: Secondary | ICD-10-CM | POA: Insufficient documentation

## 2013-01-08 DIAGNOSIS — F17200 Nicotine dependence, unspecified, uncomplicated: Secondary | ICD-10-CM | POA: Insufficient documentation

## 2013-01-08 DIAGNOSIS — J45909 Unspecified asthma, uncomplicated: Secondary | ICD-10-CM | POA: Insufficient documentation

## 2013-01-08 MED ORDER — PENICILLIN V POTASSIUM 500 MG PO TABS: 500.0000 mg | ORAL_TABLET | Freq: Four times a day (QID) | ORAL | Status: AC

## 2013-01-08 MED ORDER — BUPIVACAINE-EPINEPHRINE PF 0.5-1:200000 % IJ SOLN
1.8000 mL | Freq: Once | INTRAMUSCULAR | Status: AC
  Administered 2013-01-08: 9 mg
  Filled 2013-01-08: qty 1.8

## 2013-01-08 MED ORDER — TRAMADOL HCL 50 MG PO TABS: 50.0000 mg | ORAL_TABLET | Freq: Once | ORAL | Status: DC

## 2013-01-08 MED ORDER — TRAMADOL HCL 50 MG PO TABS: 50.0000 mg | ORAL_TABLET | Freq: Four times a day (QID) | ORAL | Status: DC | PRN

## 2013-01-08 MED ORDER — PENICILLIN V POTASSIUM 500 MG PO TABS
500.0000 mg | ORAL_TABLET | Freq: Once | ORAL | Status: AC
  Administered 2013-01-08: 500 mg via ORAL
  Filled 2013-01-08: qty 1

## 2013-01-08 NOTE — ED Notes (Signed)
Pt presents to ED with c/o toothache to right lower gum, onset yesterday.  Pt reports that he has been taking Ibuprofen with very little effect; pt's right lower gum slightly swollen.

## 2013-01-08 NOTE — ED Provider Notes (Signed)
Medical screening examination/treatment/procedure(s) were performed by non-physician practitioner and as supervising physician I was immediately available for consultation/collaboration.  Rhenda Oregon L Nikala Walsworth, MD 01/08/13 2153 

## 2013-01-08 NOTE — ED Provider Notes (Signed)
History     CSN: [id number]  Arrival date & time 01/08/13  0409   First MD Initiated Contact with Patient 01/08/13 0424      Chief Complaint  Patient presents with  . Dental Pain   HPI  History provided by the patient. Patient is a 32 year old male who presents with complaints of right lower molar pain. Pain first began yesterday and gradually increased throughout the day. Pain is worse with any pressure or palpation to teeth on the right lower side. He denies any difficulty swallowing or breathing. No swelling of the tongue. He was taking ibuprofen through the day without any improvement. No bleeding or drainage. No associated fever, chills or sweats. No nausea vomiting. No other aggravating or alleviating factors.    Past Medical History  Diagnosis Date  . Asthma     Past Surgical History  Procedure Laterality Date  . Cholecystectomy      Family History  Problem Relation Age of Onset  . Other Father   . Diabetes Father   . Hypertension Father     History  Substance Use Topics  . Smoking status: Current Every Day Smoker  . Smokeless tobacco: Not on file  . Alcohol Use: Yes      Review of Systems  Constitutional: Negative for fever, chills and diaphoresis.  HENT: Positive for dental problem. Negative for sore throat, drooling, mouth sores and trouble swallowing.   Gastrointestinal: Negative for nausea and vomiting.  All other systems reviewed and are negative.    Allergies  Review of patient's allergies indicates no known allergies.  Home Medications   Current Outpatient Rx  Name  Route  Sig  Dispense  Refill  . acetaminophen (TYLENOL) 325 MG tablet   Oral   Take 975 mg by mouth every 6 (six) hours as needed. Pain/fever         . ibuprofen (ADVIL,MOTRIN) 200 MG tablet   Oral   Take 200 mg by mouth every 6 (six) hours as needed.           BP 154/95  Pulse 94  Temp(Src) 98.5 F (36.9 C) (Oral)  Resp 16  SpO2 97%  Physical Exam  Nursing  note and vitals reviewed. Constitutional: He is oriented to person, place, and time. He appears well-developed and well-nourished.  HENT:  Head: Normocephalic.  Mouth/Throat:    Patient with complete interruption of the right lower third molar. It is coming in crooked and it appears to be pressing against the second molar. No obvious deep dental caries. There is tenderness to percussion over the second and third molar. No significant swelling of the adjacent gums. No bleeding or drainage. No swelling under the tongue.  Left lower third molar is slightly impacted. No pain.  Neck: Normal range of motion. Neck supple.  Cardiovascular: Normal rate and regular rhythm.   Pulmonary/Chest: Effort normal and breath sounds normal. No respiratory distress. He has no wheezes. He has no rales.  Lymphadenopathy:    He has no cervical adenopathy.  Neurological: He is alert and oriented to person, place, and time.  Skin: Skin is warm. No rash noted.  Psychiatric: He has a normal mood and affect. His behavior is normal.    ED Course  Procedures   Dental Block Performed by: Angus Seller Authorized by: Angus Seller Consent: Verbal consent obtained. Risks and benefits: risks, benefits and alternatives were discussed Consent given by: patient Patient identity confirmed: provided demographic data  Location: right lower third molar  Local anesthetic: Bupivacaine 0.5% with epinephrine  Anesthetic total: 1.8 ml  Irrigation method: syringe  Patient tolerance: Patient tolerated the procedure well with no immediate complications. Pain improved.  Dental Block Performed by: Angus Seller Authorized by: Angus Seller Consent: Verbal consent obtained. Risks and benefits: risks, benefits and alternatives were discussed Consent given by: patient Patient identity confirmed: provided demographic data  Location: Right lower second molar  Local anesthetic: Bupivacaine 0.5% with  epinephrine  Anesthetic total: 1.8 ml  Irrigation method: syringe  Patient tolerance: Patient tolerated the procedure well with no immediate complications. Pain improved.    1. Pain, dental       MDM  4:25 AM patient seen and evaluated. Patient appears uncomfortable but in no acute distress. Afebrile.        Angus Seller, PA-C 01/08/13 2048

## 2014-05-18 ENCOUNTER — Encounter (HOSPITAL_COMMUNITY): Payer: Self-pay | Admitting: Emergency Medicine

## 2014-05-18 ENCOUNTER — Emergency Department (HOSPITAL_COMMUNITY)
Admission: EM | Admit: 2014-05-18 | Discharge: 2014-05-18 | Disposition: A | Discharge status: Home / Self Care | Payer: Self-pay | Attending: Emergency Medicine | Admitting: Emergency Medicine

## 2014-05-18 DIAGNOSIS — Z711 Person with feared health complaint in whom no diagnosis is made: Secondary | ICD-10-CM

## 2014-05-18 DIAGNOSIS — Z720 Tobacco use: Secondary | ICD-10-CM | POA: Insufficient documentation

## 2014-05-18 DIAGNOSIS — Z113 Encounter for screening for infections with a predominantly sexual mode of transmission: Secondary | ICD-10-CM | POA: Insufficient documentation

## 2014-05-18 DIAGNOSIS — L309 Dermatitis, unspecified: Secondary | ICD-10-CM | POA: Insufficient documentation

## 2014-05-18 DIAGNOSIS — J45909 Unspecified asthma, uncomplicated: Secondary | ICD-10-CM | POA: Insufficient documentation

## 2014-05-18 MED ORDER — AZITHROMYCIN 250 MG PO TABS
1000.0000 mg | ORAL_TABLET | Freq: Once | ORAL | Status: AC
  Administered 2014-05-18: 1000 mg via ORAL
  Filled 2014-05-18: qty 4

## 2014-05-18 MED ORDER — CEFTRIAXONE SODIUM 250 MG IJ SOLR
250.0000 mg | Freq: Once | INTRAMUSCULAR | Status: AC
  Administered 2014-05-18: 250 mg via INTRAMUSCULAR
  Filled 2014-05-18: qty 250

## 2014-05-18 MED ORDER — HYDROCORTISONE 1 % EX CREA: TOPICAL_CREAM | CUTANEOUS | Status: DC

## 2014-05-18 NOTE — ED Notes (Signed)
Pt reports itching and rash to bilateral groinare for 3 days.

## 2014-05-18 NOTE — ED Notes (Signed)
Declined W/C at D/C and was escorted to lobby by RN. 

## 2014-05-18 NOTE — Discharge Instructions (Signed)
Apply cream twice daily. You were treated today for both gonorrhea and chlamydia. If these tests result positive, you will be contacted and are obligated to inform your partner.  Contact Dermatitis Contact dermatitis is a reaction to certain substances that touch the skin. Contact dermatitis can be either irritant contact dermatitis or allergic contact dermatitis. Irritant contact dermatitis does not require previous exposure to the substance for a reaction to occur.Allergic contact dermatitis only occurs if you have been exposed to the substance before. Upon a repeat exposure, your body reacts to the substance.  CAUSES  Many substances can cause contact dermatitis. Irritant dermatitis is most commonly caused by repeated exposure to mildly irritating substances, such as:  Makeup.  Soaps.  Detergents.  Bleaches.  Acids.  Metal salts, such as nickel. Allergic contact dermatitis is most commonly caused by exposure to:  Poisonous plants.  Chemicals (deodorants, shampoos).  Jewelry.  Latex.  Neomycin in triple antibiotic cream.  Preservatives in products, including clothing. SYMPTOMS  The area of skin that is exposed may develop:  Dryness or flaking.  Redness.  Cracks.  Itching.  Pain or a burning sensation.  Blisters. With allergic contact dermatitis, there may also be swelling in areas such as the eyelids, mouth, or genitals.  DIAGNOSIS  Your caregiver can usually tell what the problem is by doing a physical exam. In cases where the cause is uncertain and an allergic contact dermatitis is suspected, a patch skin test may be performed to help determine the cause of your dermatitis. TREATMENT Treatment includes protecting the skin from further contact with the irritating substance by avoiding that substance if possible. Barrier creams, powders, and gloves may be helpful. Your caregiver may also recommend:  Steroid creams or ointments applied 2 times daily. For best  results, soak the rash area in cool water for 20 minutes. Then apply the medicine. Cover the area with a plastic wrap. You can store the steroid cream in the refrigerator for a "chilly" effect on your rash. That may decrease itching. Oral steroid medicines may be needed in more severe cases.  Antibiotics or antibacterial ointments if a skin infection is present.  Antihistamine lotion or an antihistamine taken by mouth to ease itching.  Lubricants to keep moisture in your skin.  Burow's solution to reduce redness and soreness or to dry a weeping rash. Mix one packet or tablet of solution in 2 cups cool water. Dip a clean washcloth in the mixture, wring it out a bit, and put it on the affected area. Leave the cloth in place for 30 minutes. Do this as often as possible throughout the day.  Taking several cornstarch or baking soda baths daily if the area is too large to cover with a washcloth. Harsh chemicals, such as alkalis or acids, can cause skin damage that is like a burn. You should flush your skin for 15 to 20 minutes with cold water after such an exposure. You should also seek immediate medical care after exposure. Bandages (dressings), antibiotics, and pain medicine may be needed for severely irritated skin.  HOME CARE INSTRUCTIONS  Avoid the substance that caused your reaction.  Keep the area of skin that is affected away from hot water, soap, sunlight, chemicals, acidic substances, or anything else that would irritate your skin.  Do not scratch the rash. Scratching may cause the rash to become infected.  You may take cool baths to help stop the itching.  Only take over-the-counter or prescription medicines as directed by your caregiver.  See your caregiver for follow-up care as directed to make sure your skin is healing properly. SEEK MEDICAL CARE IF:   Your condition is not better after 3 days of treatment.  You seem to be getting worse.  You see signs of infection such as  swelling, tenderness, redness, soreness, or warmth in the affected area.  You have any problems related to your medicines. Document Released: 07/29/2000 Document Revised: 10/24/2011 Document Reviewed: 01/04/2011 [facility] Patient Information 2015 [place], [place]. This information is not intended to replace advice given to you by your health care provider. Make sure you discuss any questions you have with your health care provider. Sexually Transmitted Disease A sexually transmitted disease (STD) is a disease or infection that may be passed (transmitted) from person to person, usually during sexual activity. This may happen by way of saliva, semen, blood, vaginal mucus, or urine. Common STDs include:   Gonorrhea.   Chlamydia.   Syphilis.   HIV and AIDS.   Genital herpes.   Hepatitis B and C.   Trichomonas.   Human papillomavirus (HPV).   Pubic lice.   Scabies.  Mites.  Bacterial vaginosis. WHAT ARE CAUSES OF STDs? An STD may be caused by bacteria, a virus, or parasites. STDs are often transmitted during sexual activity if one person is infected. However, they may also be transmitted through nonsexual means. STDs may be transmitted after:   Sexual intercourse with an infected person.   Sharing sex toys with an infected person.   Sharing needles with an infected person or using unclean piercing or tattoo needles.  Having intimate contact with the genitals, mouth, or rectal areas of an infected person.   Exposure to infected fluids during birth. WHAT ARE THE SIGNS AND SYMPTOMS OF STDs? Different STDs have different symptoms. Some people may not have any symptoms. If symptoms are present, they may include:   Painful or bloody urination.   Pain in the pelvis, abdomen, vagina, anus, throat, or eyes.   A skin rash, itching, or irritation.  Growths, ulcerations, blisters, or sores in the genital and anal areas.  Abnormal vaginal discharge with or without bad  odor.   Penile discharge in men.   Fever.   Pain or bleeding during sexual intercourse.   Swollen glands in the groin area.   Yellow skin and eyes (jaundice). This is seen with hepatitis.   Swollen testicles.  Infertility.  Sores and blisters in the mouth. HOW ARE STDs DIAGNOSED? To make a diagnosis, your health care provider may:   Take a medical history.   Perform a physical exam.   Take a sample of any discharge to examine.  Swab the throat, cervix, opening to the penis, rectum, or vagina for testing.  Test a sample of your first morning urine.   Perform blood tests.   Perform a Pap test, if this applies.   Perform a colposcopy.   Perform a laparoscopy.  HOW ARE STDs TREATED? Treatment depends on the STD. Some STDs may be treated but not cured.   Chlamydia, gonorrhea, trichomonas, and syphilis can be cured with antibiotic medicine.   Genital herpes, hepatitis, and HIV can be treated, but not cured, with prescribed medicines. The medicines lessen symptoms.   Genital warts from HPV can be treated with medicine or by freezing, burning (electrocautery), or surgery. Warts may come back.   HPV cannot be cured with medicine or surgery. However, abnormal areas may be removed from the cervix, vagina, or vulva.   If your  diagnosis is confirmed, your recent sexual partners need treatment. This is true even if they are symptom-free or have a negative culture or evaluation. They should not have sex until their health care providers say it is okay. HOW CAN I REDUCE MY RISK OF GETTING AN STD? Take these steps to reduce your risk of getting an STD:  Use latex condoms, dental dams, and water-soluble lubricants during sexual activity. Do not use petroleum jelly or oils.  Avoid having multiple sex partners.  Do not have sex with someone who has other sex partners.  Do not have sex with anyone you do not know or who is at high risk for an STD.  Avoid risky  sex practices that can break your skin.  Do not have sex if you have open sores on your mouth or skin.  Avoid drinking too much alcohol or taking illegal drugs. Alcohol and drugs can affect your judgment and put you in a vulnerable position.  Avoid engaging in oral and anal sex acts.  Get vaccinated for HPV and hepatitis. If you have not received these vaccines in the past, talk to your health care provider about whether one or both might be right for you.   If you are at risk of being infected with HIV, it is recommended that you take a prescription medicine daily to prevent HIV infection. This is called pre-exposure prophylaxis (PrEP). You are considered at risk if:  You are a man who has sex with other men (MSM).  You are a heterosexual man or woman and are sexually active with more than one partner.  You take drugs by injection.  You are sexually active with a partner who has HIV.  Talk with your health care provider about whether you are at high risk of being infected with HIV. If you choose to begin PrEP, you should first be tested for HIV. You should then be tested every 3 months for as long as you are taking PrEP.  WHAT SHOULD I DO IF I THINK I HAVE AN STD?  See your health care provider.   Tell your sexual partner(s). They should be tested and treated for any STDs.  Do not have sex until your health care provider says it is okay. WHEN SHOULD I GET IMMEDIATE MEDICAL CARE? Contact your health care provider right away if:   You have severe abdominal pain.  You are a man and notice swelling or pain in your testicles.  You are a woman and notice swelling or pain in your vagina. Document Released: 10/22/2002 Document Revised: 08/06/2013 Document Reviewed: 02/19/2013 [facility] Patient Information 2015 [place]. This information is not intended to replace advice given to you by your health care provider. Make sure you discuss any questions you have with your health  care provider.

## 2014-05-18 NOTE — ED Provider Notes (Signed)
Medical screening examination/treatment/procedure(s) were performed by non-physician practitioner and as supervising physician I was immediately available for consultation/collaboration.  Nikkolas Coomes L Sande Pickert, MD 05/18/14 1519 

## 2014-05-18 NOTE — ED Provider Notes (Signed)
CSN: [id number]     Arrival date & time 05/18/14  2130 History   First MD Initiated Contact with Patient 05/18/14 [phone]     Chief Complaint  Patient presents with  . Rash     (Consider location/radiation/quality/duration/timing/severity/associated sxs/prior Treatment) HPI Comments: This is a 33 year old male who presents to the emergency department complaining of a rash in his groin area for 3 days. Patient reports the rash is very itchy and spreading around his rectal area. Denies any penile pain, lesions or discharge. Denies swelling or pain to "my balls". Denies fever or chills. States he has not been sexually active "for a minute", last time he had sexual intercourse was one week ago, unprotected with a fairly new partner. States he is concerned about possible sexually transmitted diseases and would like to be treated.  Patient is a 33 y.o. male presenting with rash. The history is provided by the patient.  Rash   Past Medical History  Diagnosis Date  . Asthma    Past Surgical History  Procedure Laterality Date  . Cholecystectomy     Family History  Problem Relation Age of Onset  . Other Father   . Diabetes Father   . Hypertension Father    History  Substance Use Topics  . Smoking status: Current Every Day Smoker  . Smokeless tobacco: Not on file  . Alcohol Use: Yes    Review of Systems  Skin: Positive for rash.  All other systems reviewed and are negative.     Allergies  Review of patient's allergies indicates no known allergies.  Home Medications   Prior to Admission medications   Medication Sig Start Date End Date Taking? Authorizing Provider  hydrocortisone cream 1 % Apply to affected area 2 times daily 05/18/14   Trevor Mace, PA-C   BP 123/66  Pulse 79  Temp(Src) 98.6 F (37 C) (Oral)  Resp 12  Ht 5\' 9"  (1.753 m)  Wt 187 lb (84.823 kg)  BMI 27.60 kg/m2  SpO2 100% Physical Exam  Nursing note and vitals reviewed. Constitutional: He is oriented  to person, place, and time. He appears well-developed and well-nourished. No distress.  HENT:  Head: Normocephalic and atraumatic.  Eyes: Conjunctivae and EOM are normal.  Neck: Normal range of motion. Neck supple.  Cardiovascular: Normal rate, regular rhythm and normal heart sounds.   Pulmonary/Chest: Effort normal and breath sounds normal.  Genitourinary: Testes normal and penis normal. Right testis shows no mass, no swelling and no tenderness. Left testis shows no mass, no swelling and no tenderness. No penile erythema. No discharge found.  Musculoskeletal: Normal range of motion. He exhibits no edema.  Neurological: He is alert and oriented to person, place, and time.  Skin: Skin is warm and dry.  Raised, skin-colored macuopapular rash in bilateral groin area. No signs of secondary infection. No lesions on palms/soles. No mucosal lesions.  Psychiatric: He has a normal mood and affect. His behavior is normal.    ED Course  Procedures (including critical care time) Labs Review Labs Reviewed  GC/CHLAMYDIA PROBE AMP    Imaging Review No results found.   EKG Interpretation None      MDM   Final diagnoses:  Dermatitis  Concern about STD in male without diagnosis   Patient will appearing and in no apparent distress. Afebrile, vital signs stable. Dermatitis noted to groin area. Treat with hydrocortisone cream. Regarding concern for STDs, patient given IM Rocephin and oral azithromycin. GC/Chlamydia cultures pending. Stable for discharge.  Return precautions given. Patient states understanding of treatment care plan and is agreeable.  Trevor MaceRobyn M Albert, PA-C 05/18/14 [phone]

## 2014-05-19 LAB — GC/CHLAMYDIA PROBE AMP
CT Probe RNA: NEGATIVE
GC Probe RNA: NEGATIVE

## 2014-05-29 ENCOUNTER — Emergency Department (HOSPITAL_COMMUNITY)
Admission: EM | Admit: 2014-05-29 | Discharge: 2014-05-29 | Disposition: A | Discharge status: Home / Self Care | Payer: Self-pay | Attending: Emergency Medicine | Admitting: Emergency Medicine

## 2014-05-29 ENCOUNTER — Encounter (HOSPITAL_COMMUNITY): Payer: Self-pay | Admitting: Emergency Medicine

## 2014-05-29 ENCOUNTER — Emergency Department (HOSPITAL_COMMUNITY): Payer: Self-pay

## 2014-05-29 DIAGNOSIS — R1031 Right lower quadrant pain: Secondary | ICD-10-CM | POA: Insufficient documentation

## 2014-05-29 DIAGNOSIS — R197 Diarrhea, unspecified: Secondary | ICD-10-CM | POA: Insufficient documentation

## 2014-05-29 DIAGNOSIS — Z7952 Long term (current) use of systemic steroids: Secondary | ICD-10-CM | POA: Insufficient documentation

## 2014-05-29 DIAGNOSIS — R109 Unspecified abdominal pain: Secondary | ICD-10-CM

## 2014-05-29 DIAGNOSIS — G8929 Other chronic pain: Secondary | ICD-10-CM | POA: Insufficient documentation

## 2014-05-29 DIAGNOSIS — J45909 Unspecified asthma, uncomplicated: Secondary | ICD-10-CM | POA: Insufficient documentation

## 2014-05-29 DIAGNOSIS — R112 Nausea with vomiting, unspecified: Secondary | ICD-10-CM | POA: Insufficient documentation

## 2014-05-29 HISTORY — DX: Other chronic pain: G89.29

## 2014-05-29 HISTORY — DX: Nausea with vomiting, unspecified: R11.2

## 2014-05-29 HISTORY — DX: Unspecified abdominal pain: R10.9

## 2014-05-29 HISTORY — DX: Other psychoactive substance abuse, uncomplicated: F19.10

## 2014-05-29 LAB — COMPREHENSIVE METABOLIC PANEL
ALT: 19 U/L (ref 0–53)
AST: 17 U/L (ref 0–37)
Albumin: 4.3 g/dL (ref 3.5–5.2)
Alkaline Phosphatase: 72 U/L (ref 39–117)
Anion gap: 13 (ref 5–15)
BUN: 11 mg/dL (ref 6–23)
CO2: 25 mEq/L (ref 19–32)
Calcium: 9.7 mg/dL (ref 8.4–10.5)
Chloride: 104 mEq/L (ref 96–112)
Creatinine, Ser: 0.9 mg/dL (ref 0.50–1.35)
GFR calc Af Amer: >90 mL/min (ref >90)
GFR calc non Af Amer: >90 mL/min (ref >90)
Glucose, Bld: 103 mg/dL — ABNORMAL HIGH (ref 70–99)
Potassium: 4.1 mEq/L (ref 3.7–5.3)
Sodium: 142 mEq/L (ref 137–147)
Total Bilirubin: 1 mg/dL (ref 0.3–1.2)
Total Protein: 8 g/dL (ref 6.0–8.3)

## 2014-05-29 LAB — CBC WITH DIFFERENTIAL/PLATELET
Basophils Absolute: 0 10*3/uL (ref 0.0–0.1)
Basophils Relative: 0 % (ref 0–1)
Eosinophils Absolute: 0.1 10*3/uL (ref 0.0–0.7)
Eosinophils Relative: 1 % (ref 0–5)
HCT: 41.2 % (ref 39.0–52.0)
Hemoglobin: 13.9 g/dL (ref 13.0–17.0)
Lymphocytes Relative: 25 % (ref 12–46)
Lymphs Abs: 2.3 10*3/uL (ref 0.7–4.0)
MCH: 32 pg (ref 26.0–34.0)
MCHC: 33.7 g/dL (ref 30.0–36.0)
MCV: 94.9 fL (ref 78.0–100.0)
Monocytes Absolute: 0.8 10*3/uL (ref 0.1–1.0)
Monocytes Relative: 9 % (ref 3–12)
Neutro Abs: 6.1 10*3/uL (ref 1.7–7.7)
Neutrophils Relative %: 65 % (ref 43–77)
Platelets: 235 10*3/uL (ref 150–400)
RBC: 4.34 MIL/uL (ref 4.22–5.81)
RDW: 12.2 % (ref 11.5–15.5)
WBC: 9.4 10*3/uL (ref 4.0–10.5)

## 2014-05-29 LAB — LIPASE, BLOOD: Lipase: 24 U/L (ref 11–59)

## 2014-05-29 MED ORDER — FAMOTIDINE IN NACL 20-0.9 MG/50ML-% IV SOLN
20.0000 mg | Freq: Once | INTRAVENOUS | Status: AC
  Administered 2014-05-29: 20 mg via INTRAVENOUS
  Filled 2014-05-29: qty 50

## 2014-05-29 MED ORDER — IOHEXOL 300 MG/ML  SOLN
50.0000 mL | Freq: Once | INTRAMUSCULAR | Status: AC | PRN
  Administered 2014-05-29: 50 mL via ORAL

## 2014-05-29 MED ORDER — ONDANSETRON HCL 4 MG/2ML IJ SOLN
4.0000 mg | INTRAMUSCULAR | Status: AC | PRN
  Administered 2014-05-29 (×2): 4 mg via INTRAVENOUS
  Filled 2014-05-29 (×2): qty 2

## 2014-05-29 MED ORDER — SODIUM CHLORIDE 0.9 % IV SOLN
INTRAVENOUS | Status: DC
  Administered 2014-05-29: 13:00:00 via INTRAVENOUS

## 2014-05-29 MED ORDER — PROMETHAZINE HCL 25 MG PO TABS: 25.0000 mg | ORAL_TABLET | Freq: Four times a day (QID) | ORAL | Status: DC | PRN

## 2014-05-29 MED ORDER — FENTANYL CITRATE 0.05 MG/ML IJ SOLN
50.0000 ug | INTRAMUSCULAR | Status: DC | PRN
  Administered 2014-05-29: 50 ug via INTRAVENOUS
  Filled 2014-05-29: qty 2

## 2014-05-29 MED ORDER — IOHEXOL 300 MG/ML  SOLN
100.0000 mL | Freq: Once | INTRAMUSCULAR | Status: AC | PRN
  Administered 2014-05-29: 100 mL via INTRAVENOUS

## 2014-05-29 MED ORDER — MORPHINE SULFATE 4 MG/ML IJ SOLN
4.0000 mg | INTRAMUSCULAR | Status: DC | PRN
  Filled 2014-05-29: qty 1

## 2014-05-29 NOTE — Progress Notes (Signed)
  CARE MANAGEMENT ED NOTE 05/29/2014  Patient:  Pauline AusDENNIS,Ramesses E   Account Number:  [id number]  Date Initiated:  05/29/2014  Documentation initiated by:  Edd ArbourGIBBS,Sumayyah Custodio  Subjective/Objective Assessment:   33 yr old self pay [place] pt from home, c/o RLQ abdominal pain and emesis x 3 days.  Pt reports see streaks of blood in emesis this morning.  Hx of gallbladder removal x 1 year w/ continuous pain area.     Subjective/Objective Assessment Detail:   Pt confirms no pcp no insurance Last time [facility] by any dr [facility]  Informed Cm he has never had OC nor been to [facility]  agreed to referral to [facility]     Action/Plan:   Noted no pcp, [place] see notes below assisted pt with pt belonging bag, emesis bag, kleenex and discussed expected ED process   Action/Plan Detail:   Anticipated DC Date:  05/29/2014     Status Recommendation to Physician:   Result of Recommendation:    Other ED Services  Consult Working Plan    DC Planning Services  Other  Outpatient Services - Pt will follow up  PCP issues  [facility] / [facility] (established/new)    Choice offered to / List presented to:            Status of service:  Completed, signed off  ED Comments:   ED Comments Detail:  CM spoke with pt who confirms [place] [facility] [place] with no pcp. CM discussed and provided written information for self pay pcps, importance of pcp for f/u care, www.needymeds.org, discounted pharmacies and other [place] resources such as [facility], [facility], affordable care act,  financial assistance, DSS and  health department Reviewed resources for [place] self pay pcps like [facility], family medicine at [address], [facility], general medical clinics, [facility] urgent care plus others, medication resources, [facility] out patient pharmacies and housing Pt voiced understanding and appreciation of resources provided  Provided [facility] contact information  Referral to [facility]  completed

## 2014-05-29 NOTE — Discharge Instructions (Signed)
°Emergency Department Resource Guide °1) Find a Doctor and Pay Out of Pocket °Although you won't have to find out who is covered by your insurance plan, it is a good idea to ask around and get recommendations. You will then need to call the office and see if the doctor you have chosen will accept you as a new patient and what types of options they offer for patients who are self-pay. Some doctors offer discounts or will set up payment plans for their patients who do not have insurance, but you will need to ask so you aren't surprised when you get to your appointment. ° °2) Contact Your Local Health Department °Not all health departments have doctors that can see patients for sick visits, but many do, so it is worth a call to see if yours does. If you don't know where your local health department is, you can check in your phone book. The CDC also has a tool to help you locate your state's health department, and many state websites also have listings of all of their local health departments. ° °3) Find a Walk-in Clinic °If your illness is not likely to be very severe or complicated, you may want to try a walk in clinic. These are popping up all over the country in pharmacies, drugstores, and shopping centers. They're usually staffed by nurse practitioners or physician assistants that have been trained to treat common illnesses and complaints. They're usually fairly quick and inexpensive. However, if you have serious medical issues or chronic medical problems, these are probably not your best option. ° °No Primary Care Doctor: °- Call Health Connect at  832-8000 - they can help you locate a primary care doctor that  accepts your insurance, provides certain services, etc. °- Physician Referral Service- 1-800-533-3463 ° °Chronic Pain Problems: °Organization         Address  Phone   Notes  °[facility]  (336) 297-2271 Patients need to be referred by their primary care doctor.  ° °Medication  Assistance: °Organization         Address  Phone   Notes  °[place] [facility] [address] °[place], [place] [address] (336) 641-8030 --Must be a resident of [place] °-- Must have NO insurance coverage whatsoever (no Medicaid/ Medicare, etc.) °-- The pt. MUST have a primary care doctor that directs their care regularly and follows them in the community °  °[facility]  (866) 331-1348   °United Way  (888) 892-1162   ° °Agencies that provide inexpensive medical care: °Organization         Address  Phone   Notes  °[facility]  (336) 832-8035   °[facility]    (336) 832-7272   °[facility] [address] °[place], [place] [address] (336) 832-4777   °[facility] [place] [address], °[place] (336) 271-4999   °[facility]    (336) 373-0678   °[facility]    (336) 272-1050   °[facility] ° [address], [place] Phone:  (336) 832-4444, Fax:  (336) 832-4440 Hours of Operation:  9 am - 6 pm, M-F.  Also accepts Medicaid/Medicare and self-pay.  °[facility] ° [address], [place] Phone: (336) 832-3150, Fax: (336) 832-3151. Hours of Operation:  8:30 am - 5:30 pm, M-F.  Also accepts Medicaid and self-pay.  °[facility] [place] [address], [place] Phone: (336) 878-6027   °[facility] [address], [place], [place] (336)723-1848, Ext. 123 Mondays & Thursdays: 7-9 AM.  First 15 patients are seen on a first come, first serve basis. °  ° °Medicaid-accepting [place] Providers: ° °Organization         Address  Phone   Notes  °[facility] [address], [place] (336) 641-2100 Also accepts self-pay patients.  °[facility] [address], [place] ° (336) 856-9996   °[facility] [address], [place]  (336) 288-8857   °[facility] [address] [place] [address], [place] (336) 299-7000   °Veita Bland [address], [place]  ° (336) 373-1557 Only accepts [place] Access Medicaid patients after they have their name applied to their card.  ° °Self-Pay (no insurance) in [place]: ° °Organization         Address  Phone   Notes  °Sickle Cell Patients, [facility] [address], [place] (336) 832-1970   °[facility] [address], [place] (336) 832-4400   °[facility] ° [address], [place] (336) 992-4800   °[facility]/Dr. Osei-Bonsu ° [address] [place] [address], [place] or [address], [place] (336) 841-8500 Phone number for both [place] and [place] locations is the same.  °[facility] [address], [place] (336) 299-0000   °[facility] [address] [place] [address], [place] or [address] (336) 852-7530 °(336) 878-2260   °[facility] [address], [place] (336) 350-1642, phone; (336) 294-5005, fax Sees patients 1st and 3rd Saturday of every month.  Must not qualify for public or private insurance (i.e. Medicaid, Medicare, [place] Health Choice, Veterans' Benefits) • Household income should be no more than 200% of the poverty level •The clinic cannot treat you if you are pregnant or think you are pregnant • Sexually transmitted diseases are not treated at the clinic.  ° ° °Dental Care: °Organization         Address  Phone  Notes  °[place] [facility] [address], [place] (336) 641-6152 Accepts children up to age 21 who are enrolled in Medicaid or [place] Health Choice; pregnant women with a Medicaid card; and children who have applied for Medicaid or [place] Health Choice, but were declined, whose parents can pay a reduced fee at time of service.  °[place]  [facility] [place]  [address], [place] (336) 641-7733 Accepts children up to age 21 who are enrolled in Medicaid or [place] Health Choice; pregnant women with a Medicaid card; and children who have applied for Medicaid or [place] Health Choice, but were declined, whose parents can pay a reduced fee at time of service.  °[facility] ° [address], [place] (336) 641-4533 Patients are seen by appointment only. Walk-ins are not accepted. [facility] will see patients 18 years of age and older. °Monday - Tuesday (8am-5pm) °Most Wednesdays (8:30-5pm) °$30 per visit, cash only  °[facility] ° [address], [place] (336) 641-4533 Patients are seen by appointment only. Walk-ins are not accepted. [facility] will see patients 18 years of age and older. °One   Wednesday Evening (Monthly: Volunteer Based).  $30 per visit, cash only  °[facility]  (919) 537-3737 for adults; Children under age 4, call Graduate Pediatric Dentistry at ([phone]) [phone]. Children aged 4-14, please call (919) [phone] to request a pediatric application. ° Dental services are provided in all areas of dental care including fillings, crowns and bridges, complete and partial dentures, implants, gum treatment, root canals, and extractions. Preventive care is also provided. Treatment is provided to both adults and children. °Patients are selected via a lottery and there is often a waiting list. °  °[facility] [address], °[place] ° (336) 763-8833 [email or web address] °  °[facility] [address], [place], [place] (336)723-1848, Ext. 123 Second and Fourth Thursday of each month, opens at 6:30 AM; Clinic ends at 9 AM.  Patients are seen on a first-come first-served basis, and a limited number are seen during each clinic.  ° °[facility] ° [address], [place], [place] (336) 723-7904    Eligibility Requirements °You must have lived in [place], or [place] counties for at least the last three months. °  You cannot be eligible for state or federal sponsored healthcare insurance, including Veterans Administration, Medicaid, or Medicare. °  You generally cannot be eligible for healthcare insurance through your employer.  °  How to apply: °Eligibility screenings are held every Tuesday and Wednesday afternoon from 1:00 pm until 4:00 pm. You do not need an appointment for the interview!  °[facility] [address], [place], [place] 336-631-2330   °[place] [facility]  336-342-8273   °[place]  336-703-3100   °[facility]  336-570-6415   ° °Behavioral Health Resources in the Community: °Intensive Outpatient Programs °Organization         Address  Phone  Notes  °[facility] [address], [place], [place] 336-878-6098   °[facility] [address], [place], [place] 336-832-9800   °[facility] [address], [place], [place] ° 336-882-2125   °[place] [facility] [address],  °[place], [place] 1-800-853-5163 or 336-641-4981   °Substance Abuse Resources °Organization         Address  Phone  Notes  °[facility]  336-882-2125   °[facility]  336-784-9470   °[facility]  336-285-9073   °[facility]  336-845-3988   °Residential & Outpatient Substance Abuse Program  1-800-659-3381   °Psychological Services °Organization         Address  Phone  Notes  °[facility]  336- 832-9600   °[facility]  336- 378-7881   °[place] [facility] [address], [place] 1-800-853-5163 or 336-641-4981   ° °Mobile Crisis Teams °Organization         Address  Phone  Notes  °[facility], Mobile Crisis Care Unit  1-877-626-1772   °Assertive °[facility] ° [address]  [place], [place] 336-834-9664   °Sharon DeEsch [address] °[place] 336-554-5454   ° °Self-Help/Support Groups °Organization         Address  Phone             Notes  °[facility]ental [facility] Assoc[facility] of  - variety of support groups  336- 373-1402 Call for more information  °Narcotics Anonymous (NA), [facility] [address], °[place]  2 meetings at this location  ° °  Residential Treatment Programs Organization         Address  Phone  Notes  [facility] [address],    [place]  [phone]   [facility]  [address], [place] [phone], [place], [place] [phone]   [facility] [address], [place] [phone] Admissions: 8am-3pm M-F  [facility] [address]    [place], [place] [phone]) [phone]   [facility] [address], [place], [place] [phone]   [facility] [address]  [place], [place] [phone]   [facility] Intensive Outpatient [address], [place], [place] [phone]   [facility] ([facility] [address]  [place], [place] [phone] or [phone]   [facility] ([facility]) [address] [place], [place] [phone] Accepts Medicaid  [facility] [address]  [place] [phone] Substance Abuse/Addiction Treatment   [facility] Resources Organization         Address  Phone  Notes  [facility]  (512)740-8822(888) 786-510-7951   Angie FavaJulie Brannon, PhD [address], [address] [place], [place]   [phone](336) 740-114-1207 or [phone](336) [phone]   [facility]   [address] [place], [place] (303) [phone](336) ([phone]) [phone]   [facility] [address], [place], [place] (437)[phone](336) [phone] Insurance/Medicaid/sponsorship through [facility] [address]                                    [place], [place] ([phone](336) [phone] Therapy/tele-psych/case    [facility] [address]   [place], [place] [phone](336) [phone]    Dr. Lolly MustacheArfeen  [phone](336) ([phone]   [facility] [place]  [facility] 1) [address], [place] 2) [address], [place] 3)  [address], [place] [phone](336) [phone](336) [phone]  [phone](336) [phone]   [facility] [place] [place] Child Abuse Hotline (807)453-2091(336) (805) 181-4050 or 403-168-1969(336) [phone] (After Hours)      Eat a bland diet, avoiding greasy, fatty, fried foods, as well as spicy and acidic foods or beverages.  Avoid eating within the hour or 2 before going to bed or laying down.  Also avoid teas, colas, coffee, chocolate, pepermint and spearment.  Take over the counter pepcid, one tablet by mouth twice a day, for the next 2 to 3 weeks.  May also take over the counter maalox/mylanta, as directed on packaging, as needed for discomfort.  Take the prescription as directed.  Call your regular medical doctor and the Pain Management doctor today to schedule a follow up appointment within the next week.  Return to the Emergency Department immediately if worsening.

## 2014-05-29 NOTE — ED Notes (Signed)
Bed: WA01 Expected date:  Expected time:  Means of arrival:  Comments: EMS- 33yo M, abdominal pain, vomiting blood

## 2014-05-29 NOTE — ED Provider Notes (Signed)
CSN: [id number]     Arrival date & time 05/29/14  1159 History   First MD Initiated Contact with Patient 05/29/14 1207     Chief Complaint  Patient presents with  . Abdominal Pain  . Hematemesis      HPI Pt was seen at 1230.  Per pt, c/o gradual onset and persistence of constant acute flair of his chronic right sided abd "pain" for the past 4 to 5 days.  Has been associated with multiple intermittent episodes of N/V/D.  Describes the abd pain as "cramping." Describes the emesis as having "streaks of blood in it" today. Denies fevers, no back pain, no rash, no CP/SOB, no black or blood in stools.  The symptoms have been associated with no other complaints. The patient has a significant history of similar symptoms previously, recently being evaluated for this complaint and multiple prior evals for same.        Past Medical History  Diagnosis Date  . Asthma   . Chronic abdominal pain   . Nausea and vomiting     chronic, recurrent  . Polysubstance abuse    Past Surgical History  Procedure Laterality Date  . Cholecystectomy  11/2009   Family History  Problem Relation Age of Onset  . Other Father   . Diabetes Father   . Hypertension Father    History  Substance Use Topics  . Smoking status: Current Every Day Smoker  . Smokeless tobacco: Not on file  . Alcohol Use: Yes    Review of Systems ROS: Statement: All systems negative except as marked or noted in the HPI; Constitutional: Negative for fever and chills. ; ; Eyes: Negative for eye pain, redness and discharge. ; ; ENMT: Negative for ear pain, hoarseness, nasal congestion, sinus pressure and sore throat. ; ; Cardiovascular: Negative for chest pain, palpitations, diaphoresis, dyspnea and peripheral edema. ; ; Respiratory: Negative for cough, wheezing and stridor. ; ; Gastrointestinal: +N/V/D, abd pain. Negative for blood in stool, hematemesis, jaundice and rectal bleeding. . ; ; Genitourinary: Negative for dysuria, flank pain and  hematuria. ; ; Musculoskeletal: Negative for back pain and neck pain. Negative for swelling and trauma.; ; Skin: Negative for pruritus, rash, abrasions, blisters, bruising and skin lesion.; ; Neuro: Negative for headache, lightheadedness and neck stiffness. Negative for weakness, altered level of consciousness , altered mental status, extremity weakness, paresthesias, involuntary movement, seizure and syncope.      Allergies  Review of patient's allergies indicates no known allergies.  Home Medications   Prior to Admission medications   Medication Sig Start Date End Date Taking? Authorizing Provider  hydrocortisone cream 1 % Apply to affected area 2 times daily 05/18/14  Yes Robyn M Hess, PA-C   BP 131/59  Pulse 49  Temp(Src) 98.3 F (36.8 C) (Oral)  Resp 16  SpO2 98% Physical Exam 1235: Physical examination:  Nursing notes reviewed; Vital signs and O2 SAT reviewed;  Constitutional: Well developed, Well nourished, Well hydrated, In no acute distress; Head:  Normocephalic, atraumatic; Eyes: EOMI, PERRL, No scleral icterus; ENMT: Mouth and pharynx normal, Mucous membranes moist; Neck: Supple, Full range of motion, No lymphadenopathy; Cardiovascular: Regular rate and rhythm, No murmur, rub, or gallop; Respiratory: Breath sounds clear & equal bilaterally, No rales, rhonchi, wheezes.  Speaking full sentences with ease, Normal respiratory effort/excursion; Chest: Nontender, Movement normal; Abdomen: Soft, +RUQ and mid-epigastric areas tender to palp. No rebound or guarding. +dry heaving during exam. Nondistended, Normal bowel sounds; Genitourinary: No CVA tenderness; Extremities:  Pulses normal, No tenderness, No edema, No calf edema or asymmetry.; Neuro: AA&Ox3, Major CN grossly intact.  Speech clear. No gross focal motor or sensory deficits in extremities.; Skin: Color normal, Warm, Dry.   ED Course  Procedures     MDM  MDM Reviewed: previous chart, nursing note and vitals Reviewed  previous: labs Interpretation: labs and CT scan   Results for orders placed during the hospital encounter of 05/29/14  CBC WITH DIFFERENTIAL      Result Value Ref Range   WBC 9.4  4.0 - 10.5 K/uL   RBC 4.34  4.22 - 5.81 MIL/uL   Hemoglobin 13.9  13.0 - 17.0 g/dL   HCT 65.741.2  84.639.0 - 96.252.0 %   MCV 94.9  78.0 - 100.0 fL   MCH 32.0  26.0 - 34.0 pg   MCHC 33.7  30.0 - 36.0 g/dL   RDW 95.212.2  84.111.5 - 32.415.5 %   Platelets 235  150 - 400 K/uL   Neutrophils Relative % 65  43 - 77 %   Neutro Abs 6.1  1.7 - 7.7 K/uL   Lymphocytes Relative 25  12 - 46 %   Lymphs Abs 2.3  0.7 - 4.0 K/uL   Monocytes Relative 9  3 - 12 %   Monocytes Absolute 0.8  0.1 - 1.0 K/uL   Eosinophils Relative 1  0 - 5 %   Eosinophils Absolute 0.1  0.0 - 0.7 K/uL   Basophils Relative 0  0 - 1 %   Basophils Absolute 0.0  0.0 - 0.1 K/uL  COMPREHENSIVE METABOLIC PANEL      Result Value Ref Range   Sodium 142  137 - 147 mEq/L   Potassium 4.1  3.7 - 5.3 mEq/L   Chloride 104  96 - 112 mEq/L   CO2 25  19 - 32 mEq/L   Glucose, Bld 103 (*) 70 - 99 mg/dL   BUN 11  6 - 23 mg/dL   Creatinine, Ser 4.010.90  0.50 - 1.35 mg/dL   Calcium 9.7  8.4 - 02.710.5 mg/dL   Total Protein 8.0  6.0 - 8.3 g/dL   Albumin 4.3  3.5 - 5.2 g/dL   AST 17  0 - 37 U/L   ALT 19  0 - 53 U/L   Alkaline Phosphatase 72  39 - 117 U/L   Total Bilirubin 1.0  0.3 - 1.2 mg/dL   GFR calc non Af Amer >90  >90 mL/min   GFR calc Af Amer >90  >90 mL/min   Anion gap 13  5 - 15  LIPASE, BLOOD      Result Value Ref Range   Lipase 24  11 - 59 U/L   Ct Abdomen Pelvis W Contrast 05/29/2014   CLINICAL DATA:  Right lower quadrant pain and vomiting for 3 days.  EXAM: CT ABDOMEN AND PELVIS WITH CONTRAST  TECHNIQUE: Multidetector CT imaging of the abdomen and pelvis was performed using the standard protocol following bolus administration of intravenous contrast.  CONTRAST:  50 mL OMNIPAQUE IOHEXOL 300 MG/ML SOLN, 100 mL OMNIPAQUE IOHEXOL 300 MG/ML SOLN  COMPARISON:  CT abdomen and  pelvis 05/02/2012 03/02/2011.  FINDINGS: The lung bases are clear.  No pleural or pericardial effusion.  The patient is status post cholecystectomy. The liver, biliary tree, adrenal glands, pancreas, spleen and left kidney appear normal. A punctate nonobstructing stone is identified in the upper pole of the right kidney.  The appendix is visualized and appears normal. The  stomach and small and large bowel appear normal. No lymphadenopathy or fluid is identified. There is no focal bony abnormality.  IMPRESSION: No acute abnormality.  Negative for appendicitis.  Punctate nonobstructing stone upper pole right kidney.   Electronically Signed   By: Drusilla Kannerhomas  Dalessio M.D.   On: 05/29/2014 15:32    1535:  Pt has tol PO well while in the ED without N/V.  No stooling while in the ED.  Pt sleeping most of his ED visit with easy resps, abd benign, VSS. Pt easily arousable to his name, states he feels better and wants to go home now. Long hx of chronic pain with multiple ED visits for same.  Pt endorses acute flair of his usual long standing chronic pain today, no change from his usual chronic pain pattern.  Pt encouraged to f/u with his PMD and Pain Management doctor for good continuity of care and control of his chronic pain.  Verb understanding.       Samuel JesterKathleen Casimiro Lienhard, DO 05/30/14 2021

## 2014-05-29 NOTE — ED Notes (Signed)
Pt, from home, c/o RLQ abdominal pain and emesis x 3 days.  Pt reports see streaks of blood in emesis this morning.  Hx of gallbladder removal x 1 year w/ continuous pain area.

## 2014-12-03 ENCOUNTER — Encounter (HOSPITAL_COMMUNITY): Payer: Self-pay

## 2014-12-03 ENCOUNTER — Emergency Department (HOSPITAL_COMMUNITY)
Admission: EM | Admit: 2014-12-03 | Discharge: 2014-12-03 | Disposition: A | Discharge status: Home / Self Care | Payer: Self-pay | Attending: Emergency Medicine | Admitting: Emergency Medicine

## 2014-12-03 DIAGNOSIS — Z720 Tobacco use: Secondary | ICD-10-CM | POA: Insufficient documentation

## 2014-12-03 DIAGNOSIS — R1110 Vomiting, unspecified: Secondary | ICD-10-CM | POA: Insufficient documentation

## 2014-12-03 DIAGNOSIS — R197 Diarrhea, unspecified: Secondary | ICD-10-CM | POA: Insufficient documentation

## 2014-12-03 DIAGNOSIS — Z9089 Acquired absence of other organs: Secondary | ICD-10-CM | POA: Insufficient documentation

## 2014-12-03 DIAGNOSIS — R61 Generalized hyperhidrosis: Secondary | ICD-10-CM | POA: Insufficient documentation

## 2014-12-03 DIAGNOSIS — J45909 Unspecified asthma, uncomplicated: Secondary | ICD-10-CM | POA: Insufficient documentation

## 2014-12-03 DIAGNOSIS — Z7952 Long term (current) use of systemic steroids: Secondary | ICD-10-CM | POA: Insufficient documentation

## 2014-12-03 DIAGNOSIS — R1011 Right upper quadrant pain: Secondary | ICD-10-CM | POA: Insufficient documentation

## 2014-12-03 DIAGNOSIS — G8929 Other chronic pain: Secondary | ICD-10-CM | POA: Insufficient documentation

## 2014-12-03 LAB — CBC WITH DIFFERENTIAL/PLATELET
Basophils Absolute: 0 10*3/uL (ref 0.0–0.1)
Basophils Relative: 0 % (ref 0–1)
Eosinophils Absolute: 0.1 10*3/uL (ref 0.0–0.7)
Eosinophils Relative: 1 % (ref 0–5)
HCT: 40.4 % (ref 39.0–52.0)
Hemoglobin: 13.2 g/dL (ref 13.0–17.0)
Lymphocytes Relative: 27 % (ref 12–46)
Lymphs Abs: 1.6 10*3/uL (ref 0.7–4.0)
MCH: 31.8 pg (ref 26.0–34.0)
MCHC: 32.7 g/dL (ref 30.0–36.0)
MCV: 97.3 fL (ref 78.0–100.0)
Monocytes Absolute: 0.5 10*3/uL (ref 0.1–1.0)
Monocytes Relative: 9 % (ref 3–12)
Neutro Abs: 3.8 10*3/uL (ref 1.7–7.7)
Neutrophils Relative %: 63 % (ref 43–77)
Platelets: 205 10*3/uL (ref 150–400)
RBC: 4.15 MIL/uL — ABNORMAL LOW (ref 4.22–5.81)
RDW: 12.3 % (ref 11.5–15.5)
WBC: 6 10*3/uL (ref 4.0–10.5)

## 2014-12-03 LAB — COMPREHENSIVE METABOLIC PANEL
ALT: 15 U/L (ref 0–53)
AST: 26 U/L (ref 0–37)
Albumin: 4.1 g/dL (ref 3.5–5.2)
Alkaline Phosphatase: 61 U/L (ref 39–117)
Anion gap: 7 (ref 5–15)
BUN: 11 mg/dL (ref 6–23)
CO2: 28 mmol/L (ref 19–32)
Calcium: 9.1 mg/dL (ref 8.4–10.5)
Chloride: 106 mmol/L (ref 96–112)
Creatinine, Ser: 0.98 mg/dL (ref 0.50–1.35)
GFR calc Af Amer: >90 mL/min (ref >90)
GFR calc non Af Amer: >90 mL/min (ref >90)
Glucose, Bld: 103 mg/dL — ABNORMAL HIGH (ref 70–99)
Potassium: 3.2 mmol/L — ABNORMAL LOW (ref 3.5–5.1)
Sodium: 141 mmol/L (ref 135–145)
Total Bilirubin: 1.3 mg/dL — ABNORMAL HIGH (ref 0.3–1.2)
Total Protein: 7.1 g/dL (ref 6.0–8.3)

## 2014-12-03 LAB — LIPASE, BLOOD: Lipase: 18 U/L (ref 11–59)

## 2014-12-03 MED ORDER — MORPHINE SULFATE 4 MG/ML IJ SOLN
4.0000 mg | Freq: Once | INTRAMUSCULAR | Status: AC
  Administered 2014-12-03: 4 mg via INTRAVENOUS
  Filled 2014-12-03: qty 1

## 2014-12-03 MED ORDER — POTASSIUM CHLORIDE CRYS ER 20 MEQ PO TBCR
30.0000 meq | EXTENDED_RELEASE_TABLET | Freq: Once | ORAL | Status: AC
  Administered 2014-12-03: 30 meq via ORAL
  Filled 2014-12-03 (×2): qty 1

## 2014-12-03 NOTE — ED Notes (Signed)
Pt presents with NA Per [facility] - Generalized stomach pain with N/V without diarrhea. Chills no fever. Onset 1 hour ago. Pt did state had stomach pain 1 day ago however went away.

## 2014-12-03 NOTE — Discharge Instructions (Signed)
Abdominal Pain °Many things can cause abdominal pain. Usually, abdominal pain is not caused by a disease and will improve without treatment. It can often be observed and treated at home. Your health care provider will do a physical exam and possibly order blood tests and X-rays to help determine the seriousness of your pain. However, in many cases, more time must pass before a clear cause of the pain can be found. Before that point, your health care provider may not know if you need more testing or further treatment. °HOME CARE INSTRUCTIONS  °Monitor your abdominal pain for any changes. The following actions may help to alleviate any discomfort you are experiencing: °· Only take over-the-counter or prescription medicines as directed by your health care provider. °· Do not take laxatives unless directed to do so by your health care provider. °· Try a clear liquid diet (broth, tea, or water) as directed by your health care provider. Slowly move to a bland diet as tolerated. °SEEK MEDICAL CARE IF: °· You have unexplained abdominal pain. °· You have abdominal pain associated with nausea or diarrhea. °· You have pain when you urinate or have a bowel movement. °· You experience abdominal pain that wakes you in the night. °· You have abdominal pain that is worsened or improved by eating food. °· You have abdominal pain that is worsened with eating fatty foods. °· You have a fever. °SEEK IMMEDIATE MEDICAL CARE IF:  °· Your pain does not go away within 2 hours. °· You keep throwing up (vomiting). °· Your pain is felt only in portions of the abdomen, such as the right side or the left lower portion of the abdomen. °· You pass bloody or black tarry stools. °MAKE SURE YOU: °· Understand these instructions.   °· Will watch your condition.   °· Will get help right away if you are not doing well or get worse.   °Document Released: 05/11/2005 Document Revised: 08/06/2013 Document Reviewed: 04/10/2013 °ExitCare® Patient Information  ©2015 [employer]. This information is not intended to replace advice given to you by your health care provider. Make sure you discuss any questions you have with your health care provider. ° ° °Emergency Department Resource Guide °1) Find a Doctor and Pay Out of Pocket °Although you won't have to find out who is covered by your insurance plan, it is a good idea to ask around and get recommendations. You will then need to call the office and see if the doctor you have chosen will accept you as a new patient and what types of options they offer for patients who are self-pay. Some doctors offer discounts or will set up payment plans for their patients who do not have insurance, but you will need to ask so you aren't surprised when you get to your appointment. ° °2) Contact Your Local Health Department °Not all health departments have doctors that can see patients for sick visits, but many do, so it is worth a call to see if yours does. If you don't know where your local health department is, you can check in your phone book. The CDC also has a tool to help you locate your state's health department, and many state websites also have listings of all of their local health departments. ° °3) Find a Walk-in Clinic °If your illness is not likely to be very severe or complicated, you may want to try a walk in clinic. These are popping up all over the country in pharmacies, drugstores, and shopping centers. They're   usually staffed by nurse practitioners or physician assistants that have been trained to treat common illnesses and complaints. They're usually fairly quick and inexpensive. However, if you have serious medical issues or chronic medical problems, these are probably not your best option. ° °No Primary Care Doctor: °- Call [facility] at  832-8000 - they can help you locate a primary care doctor that  accepts your insurance, provides certain services, etc. °- Physician Referral Service- 1-800-533-3463 ° °Chronic  Pain Problems: °Organization         Address  Phone   Notes  °[facility]  (336) 297-2271 Patients need to be referred by their primary care doctor.  ° °Medication Assistance: °Organization         Address  Phone   Notes  °[place] [facility] [address] °[place], [place] [address] (336) 641-8030 --Must be a resident of [place] °-- Must have NO insurance coverage whatsoever (no Medicaid/ Medicare, etc.) °-- The pt. MUST have a primary care doctor that directs their care regularly and follows them in the community °  °[facility]  (866) 331-1348   °[employer]  (888) 892-1162   ° °Agencies that provide inexpensive medical care: °Organization         Address  Phone   Notes  °[facility]  (336) 832-8035   °[facility]    (336) 832-7272   °[facility] [address] °[place], [place] [address] (336) 832-4777   °[facility] [address], °[place] (336) 271-4999   °[facility]    (336) 373-0678   °[facility]    (336) 272-1050   °[facility] ° [address], [place] Phone:  (336) 832-4444, Fax:  (336) 832-4440 Hours of Operation:  9 am - 6 pm, M-F.  Also accepts Medicaid/Medicare and self-pay.  °[facility] ° [address], [place] Phone: (336) [phone], Fax: (336) [phone]. Hours of Operation:  8:30 am - 5:30 pm, M-F.  Also accepts Medicaid and self-pay.  °[facility] [place] [address], [place] Phone: (336) 878-6027   °[facility] [address], [place], [place] (336)723-1848, Ext. 123 Mondays & Thursdays: 7-9 AM.  First 15 patients are seen on a first come, first serve basis. °  ° °Medicaid-accepting [place] Providers: ° °Organization         Address  Phone   Notes  °[facility] [address], [place] (336) 641-2100 Also  accepts self-pay patients.  °[facility] [address], [place] ° (336) 856-9996   °[facility] [address], [place] (336) 288-8857   °[facility] [address] [place] [address], [place] (336) 299-7000   °Veita Bland [address], [place]  ° (336) 373-1557 Only accepts [place] Access Medicaid patients after they have their name applied to their card.  ° °Self-Pay (no insurance) in [place]: ° °Organization         Address  Phone   Notes  °Sickle Cell Patients, [facility] [address], [place] (336) 832-1970   °[facility] [address],  (336) 832-4400   °[facility] ° [address], [place] (336) 992-4800   °[facility]/Dr. Osei-Bonsu ° [address] [place] [address], [place] or [address], [place] (336) [phone] Phone number for both [place] and [place] locations is the same.  °[facility] [address], [place] (336) 299-0000   °[facility] [address] [place] [address], [place] or [address] (336) 852-7530 °(336) 878-2260   °[facility] [address], [place] (336) 350-1642, phone; (336) 294-5005, fax Sees patients 1st and 3rd Saturday of every month.  Must not qualify for public or private insurance (i.e. Medicaid, Medicare, [place] Health Choice, Veterans' Benefits) • Household income should be no more than 200% of the poverty level •The clinic cannot treat you if you are pregnant or think you are pregnant • Sexually transmitted diseases are not treated at the clinic.  ° ° °Dental Care: °Organization         Address  Phone  Notes  °[facility] [address], [place] (336) 641-6152 Accepts children up to age 21 who are enrolled in Medicaid or [place] Health Choice; pregnant  women with a Medicaid card; and children who have applied for Medicaid or [place] Health Choice, but were declined, whose parents can pay a reduced fee at time of service.  °[facility] [place]  [address], [place] (336) 641-7733 Accepts children up to age 21 who are enrolled in Medicaid or [place] Health Choice; pregnant women with a Medicaid card; and children who have applied for Medicaid or [place] Health Choice, but were declined, whose parents can pay a reduced fee at time of service.  °[facility] ° [address], [place] (336) 641-4533 Patients are seen by appointment only. Walk-ins are not accepted. [facility] will see patients 18 years of age and older. °Monday - Tuesday (8am-5pm) °Most Wednesdays (8:30-5pm) °$30 per visit, cash only  °[facility] ° [address], [place] (336) 641-4533 Patients are seen by appointment only. Walk-ins are not accepted. [facility] will see patients 18 years of age and older. °One Wednesday Evening (Monthly: Volunteer Based).  $30 per visit, cash only  °[facility]  (919) 537-3737 for adults; Children under age 4, call Graduate Pediatric Dentistry at ([phone]) [phone]. Children aged 4-14, please call (919) [phone] to request a pediatric application. ° Dental services are provided in all areas of dental care including fillings, crowns and bridges, complete and partial dentures, implants, gum treatment, root canals, and extractions. Preventive care is also provided. Treatment is provided to both adults and children. °Patients are selected via a lottery and there is often a waiting list. °  °[facility] [address], °[place] ° (336) 763-8833 [email or web address] °  °[facility] [address], [place], [place] (336)723-1848, Ext. 123 Second and Fourth Thursday of each month, opens at 6:30 AM; Clinic ends at 9 AM.  Patients are  seen on a first-come first-served basis, and a limited number are seen during each clinic.  ° °[facility] ° [address], [place], [place] (336) 723-7904   Eligibility Requirements °You must have lived in [place], [place], or [place] counties for at least the last three months. °  You cannot be eligible for state or federal sponsored healthcare insurance, including Veterans Administration, Medicaid, or Medicare. °  You generally cannot be eligible for healthcare insurance through   your employer.  °  How to apply: °Eligibility screenings are held every Tuesday and Wednesday afternoon from 1:00 pm until 4:00 pm. You do not need an appointment for the interview!  °[facility] [address], [place], [place] 336-631-2330   °[facility]  336-342-8273   °[facility]  336-703-3100   °[facility]  336-570-6415   ° °Behavioral Health Resources in the Community: °Intensive Outpatient Programs °Organization         Address  Phone  Notes  °[facility] [address], [place], [place] 336-878-6098   °[facility] [address], [place], [place] 336-832-9800   °[facility] [address], [place], [place] ° 336-882-2125   °[facility] [address],  °[place], [place] 1-800-853-5163 or 336-641-4981   °Substance Abuse Resources °Organization         Address  Phone  Notes  °[facility]  336-882-2125   °[facility]  336-784-9470   °[facility]  336-285-9073   °[facility]  336-845-3988   °Residential & Outpatient Substance Abuse Program  1-800-659-3381   °Psychological Services °Organization         Address  Phone  Notes  °[facility]  336- 832-9600   °[facility]  336- 378-7881   °[facility] [address], [place] 1-800-853-5163 or 336-641-4981   ° °Mobile Crisis  Teams °Organization         Address  Phone  Notes  °[facility], Mobile Crisis Care Unit  1-877-626-1772   °Assertive °[facility] ° [address] [place], [place] 336-834-9664   °Sharon DeEsch [address] °[place] 336-554-5454   ° °Self-Help/Support Groups °Organization         Address  Phone             Notes  °[facility] - variety of support groups  336- 373-1402 Call for more information  °Narcotics Anonymous (NA), [facility] [address], °[place]  2 meetings at this location  ° °Residential Treatment Programs °Organization         Address  Phone  Notes  °[facility] [address],    °[place]  1-866-801-8205   °[facility] ° [address], [place], [place] 704-293-8524   °[facility] [address], [place] 336-845-3988 Admissions: 8am-3pm M-F  °[facility] [address]    °[place], [place] 336-841-1104   °[facility] [address], [place], [place] 336-379-7146   °[facility] [address]  °[place], [place] 336-285-9073   °[facility] Intensive Outpatient [address], [place], [place] 336-852-3033   °[facility] [address]  °[place], [place] 1-877-615-2722 or 336-784-9470   °[facility] [address] [place], [place] 336-227-7417 Accepts Medicaid  °[facility] [address]  °[place] 1-800-659-3381 Substance Abuse/Addiction Treatment  ° °[place] Behavioral Health Resources °Organization         Address  Phone  Notes  °[facility]  (888) 581-9988   °Julie Brannon, PhD [address] [place], [place]   (336) 349-5553 or (336) 951-0000   °[facility]   [address] °[place],  (336) 349-4454   °  [facility] [address], [place], [place] (336) 342-8316  Insurance/Medicaid/sponsorship through [facility]  °[facility] [address]                                    [place], [place] (336) 342-8316 Therapy/tele-psych/case  °[facility] [address]  ° [place], [place] (336) 349-2233    °Dr. Arfeen  (336) 349-4544   °[facility] [place]  [employer] [place] [facility] 1) [address], [place] °2) [address], [place] °3)  [address], [place] (336) 349-3220 °(336) 342-7768 ° °(336) 342-8140   °[place] Child Abuse [facility] (336) 342-1394 or (336) 342-3537 (After Hours)    ° ° ° ° °

## 2014-12-03 NOTE — ED Provider Notes (Signed)
CSN: [id number]     Arrival date & time 12/03/14  [id number] History   None    Chief Complaint  Patient presents with  . Abdominal Pain  . Chills  . Nausea  . Emesis     (Consider location/radiation/quality/duration/timing/severity/associated sxs/prior Treatment) Patient is a 34 y.o. male presenting with abdominal pain and vomiting. The history is provided by the patient. No language interpreter was used.  Abdominal Pain Associated symptoms: vomiting   Emesis Associated symptoms: abdominal pain   Ms. Matthew Scott is a 34 y.o male with a history of chronic RUQ pain after cholecystectomy two years ago who presents for intermittent RUQ abdominal pain that started this morning after brushing his teeth.  Had also had 4 episodes of vomiting, diarrhea, and diaphoresis since. He states his pain is 10/10 now. Moving around makes it worse. He denies any fever, chest pain, shortness of breath, hematochezia, dysuria, or hematuria. He told the nurse that he was supposed to be in court today.   Past Medical History  Diagnosis Date  . Asthma   . Chronic abdominal pain   . Nausea and vomiting     chronic, recurrent  . Polysubstance abuse    Past Surgical History  Procedure Laterality Date  . Cholecystectomy  11/2009   Family History  Problem Relation Age of Onset  . Other Father   . Diabetes Father   . Hypertension Father    History  Substance Use Topics  . Smoking status: Current Every Day Smoker  . Smokeless tobacco: Not on file  . Alcohol Use: Yes    Review of Systems  Gastrointestinal: Positive for vomiting and abdominal pain.  All other systems reviewed and are negative.     Allergies  Review of patient's allergies indicates no known allergies.  Home Medications   Prior to Admission medications   Medication Sig Start Date End Date Taking? Authorizing Provider  acetaminophen (TYLENOL) 500 MG tablet Take 500 mg by mouth every 6 (six) hours as needed.   Yes Historical Provider, MD   promethazine (PHENERGAN) 25 MG tablet Take 1 tablet (25 mg total) by mouth every 6 (six) hours as needed for nausea or vomiting. 05/29/14  Yes Samuel JesterKathleen McManus, DO  hydrocortisone cream 1 % Apply to affected area 2 times daily Patient not taking: Reported on 12/03/2014 05/18/14   Robyn M Hess, PA-C   BP 122/63 mmHg  Pulse 62  Temp(Src) 98.1 F (36.7 C) (Oral)  Resp 20  Ht 5\' 9"  (1.753 m)  Wt 189 lb (85.73 kg)  BMI 27.90 kg/m2  SpO2 98% Physical Exam  Constitutional: He is oriented to person, place, and time. He appears well-developed and well-nourished.  Cardiovascular: Normal rate, regular rhythm and normal heart sounds.   Pulmonary/Chest: Effort normal and breath sounds normal.  Abdominal: Normal appearance. He exhibits no distension. There is no tenderness. There is no guarding and no CVA tenderness.  No reproducible abdominal pain on palpation.   Neurological: He is alert and oriented to person, place, and time.  Skin: Skin is warm and dry.  Nursing note and vitals reviewed.   ED Course  Procedures (including critical care time) Labs Review Labs Reviewed  CBC WITH DIFFERENTIAL/PLATELET - Abnormal; Notable for the following:    RBC 4.15 (*)    All other components within normal limits  COMPREHENSIVE METABOLIC PANEL - Abnormal; Notable for the following:    Potassium 3.2 (*)    Glucose, Bld 103 (*)    Total Bilirubin 1.3 (*)  All other components within normal limits  LIPASE, BLOOD    Imaging Review No results found.   EKG Interpretation   Date/Time:  Wednesday December 03 2014 08:25:24 EDT Ventricular Rate:  58 PR Interval:  173 QRS Duration: 106 QT Interval:  416 QTC Calculation: 409 R Axis:   62 Text Interpretation:  Sinus or ectopic atrial rhythm ST elev, probable  normal early repol pattern Baseline wander in lead(s) V3 V4 V6 No  significant change since last tracing Confirmed by Bebe Shaggy  MD, DONALD  [phone]) on 12/03/2014 8:41:16 AM      MDM   Final  diagnoses:  Abdominal pain, RUQ  Patient presents for RUQ abdominal pain.  This is intermittent and chronic in nature after his cholecystectomy 2 years ago.  10:10 Patient states he is feeling better.  I explained that his postassium is slighyly low and that he should follow up with a provider using the resource guide. I have given him potassium in the ED. Otherwise, his labs are unremarkable.      Catha Gosselin, PA-C 12/03/14 [address] PA-C 12/04/14 1851  Zadie Rhine, MD 12/05/14 [phone]

## 2014-12-03 NOTE — ED Notes (Signed)
Pt was supposed to be in court today. Informed pt that we would not be fax anything to court to excuse him.Pt has chronic stomach pain with N/V. Pt has run out of phenergan. Pt states has not been here for a long time. Last time received phenergan and pain medication with good relief.

## 2014-12-03 NOTE — ED Notes (Signed)
EDPA PRESENT at bedside. 

## 2014-12-03 NOTE — ED Provider Notes (Signed)
Patient seen/examined in the Emergency Department in conjunction with Midlevel Provider Patel Patient reports reports abdominal pain Exam : awake/alert, no distress, no focal abdominal tenderness Plan: stable for d/c home    Matthew [name] Analyse Angst, MD 12/03/14 1015

## 2014-12-03 NOTE — ED Notes (Signed)
Bed: [id number] Expected date:  Expected time:  Means of arrival:  Comments: EMS- RUQ abd pain

## 2014-12-05 ENCOUNTER — Emergency Department (HOSPITAL_COMMUNITY): Payer: Self-pay

## 2014-12-05 ENCOUNTER — Inpatient Hospital Stay (HOSPITAL_COMMUNITY)
Admission: EM | Admit: 2014-12-05 | Discharge: 2014-12-08 | DRG: 103 | Disposition: A | Discharge status: Home / Self Care | Payer: Self-pay | Attending: Family Medicine | Admitting: Family Medicine

## 2014-12-05 ENCOUNTER — Encounter (HOSPITAL_COMMUNITY): Payer: Self-pay | Admitting: *Deleted

## 2014-12-05 DIAGNOSIS — J45909 Unspecified asthma, uncomplicated: Secondary | ICD-10-CM | POA: Diagnosis present

## 2014-12-05 DIAGNOSIS — R1110 Vomiting, unspecified: Secondary | ICD-10-CM | POA: Diagnosis present

## 2014-12-05 DIAGNOSIS — F1210 Cannabis abuse, uncomplicated: Secondary | ICD-10-CM | POA: Diagnosis present

## 2014-12-05 DIAGNOSIS — R109 Unspecified abdominal pain: Secondary | ICD-10-CM | POA: Diagnosis present

## 2014-12-05 DIAGNOSIS — T407X5A Adverse effect of cannabis (derivatives), initial encounter: Secondary | ICD-10-CM | POA: Diagnosis present

## 2014-12-05 DIAGNOSIS — K3184 Gastroparesis: Secondary | ICD-10-CM | POA: Diagnosis present

## 2014-12-05 DIAGNOSIS — Z79899 Other long term (current) drug therapy: Secondary | ICD-10-CM

## 2014-12-05 DIAGNOSIS — Z8249 Family history of ischemic heart disease and other diseases of the circulatory system: Secondary | ICD-10-CM

## 2014-12-05 DIAGNOSIS — G43A0 Cyclical vomiting, not intractable: Principal | ICD-10-CM | POA: Diagnosis present

## 2014-12-05 DIAGNOSIS — R1084 Generalized abdominal pain: Secondary | ICD-10-CM

## 2014-12-05 DIAGNOSIS — F17210 Nicotine dependence, cigarettes, uncomplicated: Secondary | ICD-10-CM | POA: Diagnosis present

## 2014-12-05 DIAGNOSIS — R1115 Cyclical vomiting syndrome unrelated to migraine: Secondary | ICD-10-CM | POA: Diagnosis present

## 2014-12-05 DIAGNOSIS — Z833 Family history of diabetes mellitus: Secondary | ICD-10-CM

## 2014-12-05 DIAGNOSIS — F1910 Other psychoactive substance abuse, uncomplicated: Secondary | ICD-10-CM | POA: Diagnosis present

## 2014-12-05 DIAGNOSIS — G43A1 Cyclical vomiting, intractable: Secondary | ICD-10-CM

## 2014-12-05 HISTORY — DX: Other specified diseases of gallbladder: K82.8

## 2014-12-05 HISTORY — DX: Gastroparesis: K31.84

## 2014-12-05 HISTORY — DX: Gastritis, unspecified, without bleeding: K29.70

## 2014-12-05 HISTORY — DX: Gastroduodenitis, unspecified, without bleeding: K29.90

## 2014-12-05 HISTORY — DX: Other specified bacterial intestinal infections: A04.8

## 2014-12-05 LAB — CBC WITH DIFFERENTIAL/PLATELET
Basophils Absolute: 0 10*3/uL (ref 0.0–0.1)
Basophils Relative: 0 % (ref 0–1)
Eosinophils Absolute: 0.1 10*3/uL (ref 0.0–0.7)
Eosinophils Relative: 1 % (ref 0–5)
HCT: 44.6 % (ref 39.0–52.0)
Hemoglobin: 14.7 g/dL (ref 13.0–17.0)
Lymphocytes Relative: 45 % (ref 12–46)
Lymphs Abs: 2.2 10*3/uL (ref 0.7–4.0)
MCH: 32.1 pg (ref 26.0–34.0)
MCHC: 33 g/dL (ref 30.0–36.0)
MCV: 97.4 fL (ref 78.0–100.0)
Monocytes Absolute: 0.4 10*3/uL (ref 0.1–1.0)
Monocytes Relative: 9 % (ref 3–12)
Neutro Abs: 2.2 10*3/uL (ref 1.7–7.7)
Neutrophils Relative %: 45 % (ref 43–77)
Platelets: 217 10*3/uL (ref 150–400)
RBC: 4.58 MIL/uL (ref 4.22–5.81)
RDW: 12.2 % (ref 11.5–15.5)
WBC: 4.9 10*3/uL (ref 4.0–10.5)

## 2014-12-05 LAB — COMPREHENSIVE METABOLIC PANEL
ALT: 19 U/L (ref 0–53)
AST: 34 U/L (ref 0–37)
Albumin: 4.7 g/dL (ref 3.5–5.2)
Alkaline Phosphatase: 79 U/L (ref 39–117)
Anion gap: 9 (ref 5–15)
BUN: 11 mg/dL (ref 6–23)
CO2: 25 mmol/L (ref 19–32)
Calcium: 9.7 mg/dL (ref 8.4–10.5)
Chloride: 107 mmol/L (ref 96–112)
Creatinine, Ser: 1.07 mg/dL (ref 0.50–1.35)
GFR calc Af Amer: >90 mL/min (ref >90)
GFR calc non Af Amer: 88 mL/min — ABNORMAL LOW (ref >90)
Glucose, Bld: 110 mg/dL — ABNORMAL HIGH (ref 70–99)
Potassium: 3.7 mmol/L (ref 3.5–5.1)
Sodium: 141 mmol/L (ref 135–145)
Total Bilirubin: 1 mg/dL (ref 0.3–1.2)
Total Protein: 8.1 g/dL (ref 6.0–8.3)

## 2014-12-05 LAB — URINE MICROSCOPIC-ADD ON

## 2014-12-05 LAB — URINALYSIS, ROUTINE W REFLEX MICROSCOPIC
Glucose, UA: NEGATIVE mg/dL
Ketones, ur: NEGATIVE mg/dL
Leukocytes, UA: NEGATIVE
Nitrite: NEGATIVE
Protein, ur: 30 mg/dL — AB
Specific Gravity, Urine: 1.03 (ref 1.005–1.030)
Urobilinogen, UA: 0.2 mg/dL (ref 0.0–1.0)
pH: 6 (ref 5.0–8.0)

## 2014-12-05 LAB — ETHANOL: Alcohol, Ethyl (B): <5 mg/dL (ref 0–9)

## 2014-12-05 LAB — RAPID URINE DRUG SCREEN, HOSP PERFORMED
Amphetamines: NOT DETECTED
Barbiturates: NOT DETECTED
Benzodiazepines: NOT DETECTED
Cocaine: NOT DETECTED
Opiates: NOT DETECTED
Tetrahydrocannabinol: POSITIVE — AB

## 2014-12-05 LAB — ACETAMINOPHEN LEVEL: Acetaminophen (Tylenol), Serum: <10 ug/mL — ABNORMAL LOW (ref 10–30)

## 2014-12-05 LAB — LIPASE, BLOOD: Lipase: 19 U/L (ref 11–59)

## 2014-12-05 LAB — SALICYLATE LEVEL: Salicylate Lvl: <4 mg/dL (ref 2.8–20.0)

## 2014-12-05 LAB — TROPONIN I: Troponin I: <0.03 ng/mL (ref <0.031)

## 2014-12-05 MED ORDER — ONDANSETRON HCL 4 MG PO TABS
4.0000 mg | ORAL_TABLET | Freq: Four times a day (QID) | ORAL | Status: DC | PRN
  Administered 2014-12-07: 4 mg via ORAL
  Filled 2014-12-05: qty 1

## 2014-12-05 MED ORDER — POTASSIUM CHLORIDE IN NACL 20-0.9 MEQ/L-% IV SOLN
INTRAVENOUS | Status: DC
  Administered 2014-12-05 – 2014-12-08 (×5): via INTRAVENOUS
  Filled 2014-12-05 (×8): qty 1000

## 2014-12-05 MED ORDER — ONDANSETRON HCL 4 MG/2ML IJ SOLN
4.0000 mg | Freq: Once | INTRAMUSCULAR | Status: AC
  Filled 2014-12-05: qty 2

## 2014-12-05 MED ORDER — ONDANSETRON HCL 4 MG/2ML IJ SOLN
4.0000 mg | Freq: Four times a day (QID) | INTRAMUSCULAR | Status: DC | PRN
  Administered 2014-12-05 – 2014-12-07 (×4): 4 mg via INTRAVENOUS
  Filled 2014-12-05 (×4): qty 2

## 2014-12-05 MED ORDER — ONDANSETRON HCL 4 MG/2ML IJ SOLN
4.0000 mg | Freq: Once | INTRAMUSCULAR | Status: AC
  Administered 2014-12-05: 4 mg via INTRAVENOUS
  Filled 2014-12-05: qty 2

## 2014-12-05 MED ORDER — LORAZEPAM 2 MG/ML IJ SOLN
0.5000 mg | Freq: Four times a day (QID) | INTRAMUSCULAR | Status: DC | PRN
  Administered 2014-12-05 – 2014-12-07 (×6): 0.5 mg via INTRAVENOUS
  Filled 2014-12-05 (×6): qty 1

## 2014-12-05 MED ORDER — PANTOPRAZOLE SODIUM 40 MG IV SOLR
40.0000 mg | Freq: Once | INTRAVENOUS | Status: AC
  Administered 2014-12-05: 40 mg via INTRAVENOUS
  Filled 2014-12-05: qty 40

## 2014-12-05 MED ORDER — SODIUM CHLORIDE 0.9 % IV BOLUS (SEPSIS)
1000.0000 mL | Freq: Once | INTRAVENOUS | Status: AC
  Administered 2014-12-05: 1000 mL via INTRAVENOUS

## 2014-12-05 MED ORDER — KETOROLAC TROMETHAMINE 30 MG/ML IJ SOLN
30.0000 mg | Freq: Four times a day (QID) | INTRAMUSCULAR | Status: DC | PRN
  Administered 2014-12-05 – 2014-12-06 (×2): 30 mg via INTRAVENOUS
  Filled 2014-12-05 (×3): qty 1

## 2014-12-05 MED ORDER — IOHEXOL 300 MG/ML  SOLN
100.0000 mL | Freq: Once | INTRAMUSCULAR | Status: AC | PRN
  Administered 2014-12-05: 100 mL via INTRAVENOUS

## 2014-12-05 MED ORDER — ENOXAPARIN SODIUM 40 MG/0.4ML ~~LOC~~ SOLN
40.0000 mg | SUBCUTANEOUS | Status: DC
  Administered 2014-12-05 – 2014-12-07 (×3): 40 mg via SUBCUTANEOUS
  Filled 2014-12-05 (×4): qty 0.4

## 2014-12-05 MED ORDER — MORPHINE SULFATE 4 MG/ML IJ SOLN
4.0000 mg | Freq: Once | INTRAMUSCULAR | Status: AC
  Administered 2014-12-05: 4 mg via INTRAVENOUS
  Filled 2014-12-05: qty 1

## 2014-12-05 MED ORDER — PANTOPRAZOLE SODIUM 40 MG IV SOLR
40.0000 mg | INTRAVENOUS | Status: DC
  Administered 2014-12-05 – 2014-12-07 (×3): 40 mg via INTRAVENOUS
  Filled 2014-12-05 (×5): qty 40

## 2014-12-05 MED ORDER — METOCLOPRAMIDE HCL 5 MG/ML IJ SOLN
5.0000 mg | Freq: Four times a day (QID) | INTRAMUSCULAR | Status: DC
  Administered 2014-12-05 – 2014-12-07 (×8): 5 mg via INTRAVENOUS
  Filled 2014-12-05 (×2): qty 1
  Filled 2014-12-05 (×2): qty 2
  Filled 2014-12-05 (×4): qty 1
  Filled 2014-12-05: qty 2
  Filled 2014-12-05 (×2): qty 1

## 2014-12-05 MED ORDER — ONDANSETRON HCL 4 MG/2ML IJ SOLN
4.0000 mg | Freq: Once | INTRAMUSCULAR | Status: AC
  Administered 2014-12-05: 4 mg via INTRAVENOUS

## 2014-12-05 MED ORDER — ACETAMINOPHEN 325 MG PO TABS: 650.0000 mg | ORAL_TABLET | Freq: Four times a day (QID) | ORAL | Status: DC | PRN

## 2014-12-05 MED ORDER — ACETAMINOPHEN 650 MG RE SUPP: 650.0000 mg | Freq: Four times a day (QID) | RECTAL | Status: DC | PRN

## 2014-12-05 NOTE — ED Provider Notes (Signed)
CSN: [id number]     Arrival date & time 12/05/14  1027 History   First MD Initiated Contact with Patient 12/05/14 1029     Chief Complaint  Patient presents with  . Emesis  . Abdominal Pain     (Consider location/radiation/quality/duration/timing/severity/associated sxs/prior Treatment) The history is provided by the patient. No language interpreter was used.  Matthew Scott is a 34 year old male with past medical history of chronic abdominal pain, polysubstance abuse, nausea and vomiting, cholecystectomy in 2011 presenting to the ED with right upper quadrant abdominal pain that has been ongoing for approximate 4 days. Patient reports the abdominal pain started the right upper quadrant but has now radiated to all over his abdomen described as a sharp, shooting pain that is constant. Reported that he's been having nausea and vomiting-reported 2 episodes of emesis yesterday with at least 7 episodes of emesis today-NB/NB. Reported that he's been unable to keep any food or fluids down. Stated that he's been passing gas without issues, reported that his last bowel movement was today, reported to be normal. Stated that he also has been experiencing chest pain over the course of the past 3 days localized left-sided the chest intermittent. When asked the patient is participating in alcohol use-reported that he has drank 5 days ago, is unable to recall tachycardia how much he drank. Reported that he has been smoking marijuana-reported that he smoked marijuana yesterday. Denied heroin, cocaine, other illicit drug use. When asked how long he's been having this pain 4, reported that since his surgery back in 2011-when asked if he has been following up with anyone regarding this chronic issue, reported that he has not. Denied fever, shortness of breath, difficulty breathing, melena, hematochezia, dysuria, hematuria, hemoptysis. PCP none  Past Medical History  Diagnosis Date  . Asthma   . Chronic abdominal pain    . Nausea and vomiting     chronic, recurrent  . Polysubstance abuse    Past Surgical History  Procedure Laterality Date  . Cholecystectomy  11/2009   Family History  Problem Relation Age of Onset  . Other Father   . Diabetes Father   . Hypertension Father    History  Substance Use Topics  . Smoking status: Current Every Day Smoker  . Smokeless tobacco: Not on file  . Alcohol Use: Yes    Review of Systems  Constitutional: Positive for chills. Negative for fever.  Eyes: Positive for visual disturbance.  Respiratory: Negative for cough, chest tightness and shortness of breath.   Cardiovascular: Positive for chest pain.  Gastrointestinal: Positive for nausea, vomiting and abdominal pain. Negative for diarrhea, constipation, blood in stool and anal bleeding.  Genitourinary: Negative for dysuria, hematuria and decreased urine volume.  Musculoskeletal: Negative for back pain, neck pain and neck stiffness.  Neurological: Negative for weakness and numbness.      Allergies  Review of patient's allergies indicates no known allergies.  Home Medications   Prior to Admission medications   Medication Sig Start Date End Date Taking? Authorizing Provider  acetaminophen (TYLENOL) 500 MG tablet Take 500 mg by mouth every 6 (six) hours as needed.   Yes Historical Provider, MD  hydrocortisone cream 1 % Apply to affected area 2 times daily Patient not taking: Reported on 12/03/2014 05/18/14   Kathrynn Speed, PA-C  promethazine (PHENERGAN) 25 MG tablet Take 1 tablet (25 mg total) by mouth every 6 (six) hours as needed for nausea or vomiting. Patient not taking: Reported on 12/05/2014 05/29/14  Samuel Jester, DO   BP 145/72 mmHg  Pulse 48  Temp(Src) 97.7 F (36.5 C) (Rectal)  Resp 14  SpO2 100% Physical Exam  Constitutional: He is oriented to person, place, and time. He appears well-developed and well-nourished. No distress.  HENT:  Head: Normocephalic and atraumatic.  Mouth/Throat:  Oropharynx is clear and moist. No oropharyngeal exudate.  Eyes: Conjunctivae and EOM are normal. Pupils are equal, round, and reactive to light. Right eye exhibits no discharge. Left eye exhibits no discharge.  Neck: Normal range of motion. Neck supple.  Cardiovascular: Regular rhythm and normal heart sounds.  Bradycardia present.  Exam reveals no friction rub.   No murmur heard. Pulmonary/Chest: Effort normal and breath sounds normal. No respiratory distress. He has no wheezes. He has no rales.  Abdominal: Soft. Bowel sounds are normal. He exhibits no distension. There is tenderness. There is no rebound, no guarding and no CVA tenderness.  Diffuse tenderness upon palpation to the abdomen  Musculoskeletal: Normal range of motion.  Neurological: He is alert and oriented to person, place, and time. No cranial nerve deficit. He exhibits normal muscle tone. Coordination normal.  Skin: Skin is warm and dry. No rash noted. He is not diaphoretic. No erythema.  Psychiatric: He has a normal mood and affect. His behavior is normal. Thought content normal.  Nursing note and vitals reviewed.   ED Course  Procedures (including critical care time)  Results for orders placed or performed during the hospital encounter of 12/05/14  CBC with Differential/Platelet  Result Value Ref Range   WBC 4.9 4.0 - 10.5 K/uL   RBC 4.58 4.22 - 5.81 MIL/uL   Hemoglobin 14.7 13.0 - 17.0 g/dL   HCT 16.1 09.6 - 04.5 %   MCV 97.4 78.0 - 100.0 fL   MCH 32.1 26.0 - 34.0 pg   MCHC 33.0 30.0 - 36.0 g/dL   RDW 40.9 81.1 - 91.4 %   Platelets 217 150 - 400 K/uL   Neutrophils Relative % 45 43 - 77 %   Neutro Abs 2.2 1.7 - 7.7 K/uL   Lymphocytes Relative 45 12 - 46 %   Lymphs Abs 2.2 0.7 - 4.0 K/uL   Monocytes Relative 9 3 - 12 %   Monocytes Absolute 0.4 0.1 - 1.0 K/uL   Eosinophils Relative 1 0 - 5 %   Eosinophils Absolute 0.1 0.0 - 0.7 K/uL   Basophils Relative 0 0 - 1 %   Basophils Absolute 0.0 0.0 - 0.1 K/uL   Comprehensive metabolic panel  Result Value Ref Range   Sodium 141 135 - 145 mmol/L   Potassium 3.7 3.5 - 5.1 mmol/L   Chloride 107 96 - 112 mmol/L   CO2 25 19 - 32 mmol/L   Glucose, Bld 110 (H) 70 - 99 mg/dL   BUN 11 6 - 23 mg/dL   Creatinine, Ser 7.82 0.50 - 1.35 mg/dL   Calcium 9.7 8.4 - 95.6 mg/dL   Total Protein 8.1 6.0 - 8.3 g/dL   Albumin 4.7 3.5 - 5.2 g/dL   AST 34 0 - 37 U/L   ALT 19 0 - 53 U/L   Alkaline Phosphatase 79 39 - 117 U/L   Total Bilirubin 1.0 0.3 - 1.2 mg/dL   GFR calc non Af Amer 88 (L) >90 mL/min   GFR calc Af Amer >90 >90 mL/min   Anion gap 9 5 - 15  Lipase, blood  Result Value Ref Range   Lipase 19 11 - 59 U/L  Urinalysis, Routine w reflex microscopic  Result Value Ref Range   Color, Urine AMBER (A) YELLOW   APPearance CLEAR CLEAR   Specific Gravity, Urine 1.030 1.005 - 1.030   pH 6.0 5.0 - 8.0   Glucose, UA NEGATIVE NEGATIVE mg/dL   Hgb urine dipstick SMALL (A) NEGATIVE   Bilirubin Urine SMALL (A) NEGATIVE   Ketones, ur NEGATIVE NEGATIVE mg/dL   Protein, ur 30 (A) NEGATIVE mg/dL   Urobilinogen, UA 0.2 0.0 - 1.0 mg/dL   Nitrite NEGATIVE NEGATIVE   Leukocytes, UA NEGATIVE NEGATIVE  Urine rapid drug screen (hosp performed)  Result Value Ref Range   Opiates NONE DETECTED NONE DETECTED   Cocaine NONE DETECTED NONE DETECTED   Benzodiazepines NONE DETECTED NONE DETECTED   Amphetamines NONE DETECTED NONE DETECTED   Tetrahydrocannabinol POSITIVE (A) NONE DETECTED   Barbiturates NONE DETECTED NONE DETECTED  Troponin I  Result Value Ref Range   Troponin I <0.03 <0.031 ng/mL  Ethanol  Result Value Ref Range   Alcohol, Ethyl (B) <5 0 - 9 mg/dL  Acetaminophen level  Result Value Ref Range   Acetaminophen (Tylenol), Serum <10.0 (L) 10 - 30 ug/mL  Salicylate level  Result Value Ref Range   Salicylate Lvl <4.0 2.8 - 20.0 mg/dL  Urine microscopic-add on  Result Value Ref Range   Squamous Epithelial / LPF MANY (A) RARE   WBC, UA 3-6 <3 WBC/hpf    RBC / HPF 0-2 <3 RBC/hpf   Bacteria, UA MANY (A) RARE    Labs Review Labs Reviewed  COMPREHENSIVE METABOLIC PANEL - Abnormal; Notable for the following:    Glucose, Bld 110 (*)    GFR calc non Af Amer 88 (*)    All other components within normal limits  URINALYSIS, ROUTINE W REFLEX MICROSCOPIC - Abnormal; Notable for the following:    Color, Urine AMBER (*)    Hgb urine dipstick SMALL (*)    Bilirubin Urine SMALL (*)    Protein, ur 30 (*)    All other components within normal limits  URINE RAPID DRUG SCREEN (HOSP PERFORMED) - Abnormal; Notable for the following:    Tetrahydrocannabinol POSITIVE (*)    All other components within normal limits  ACETAMINOPHEN LEVEL - Abnormal; Notable for the following:    Acetaminophen (Tylenol), Serum <10.0 (*)    All other components within normal limits  URINE MICROSCOPIC-ADD ON - Abnormal; Notable for the following:    Squamous Epithelial / LPF MANY (*)    Bacteria, UA MANY (*)    All other components within normal limits  CBC WITH DIFFERENTIAL/PLATELET  LIPASE, BLOOD  TROPONIN I  ETHANOL  SALICYLATE LEVEL    Imaging Review Dg Chest 2 View  12/05/2014   CLINICAL DATA:  33 year old male with a history of vomiting and abdominal pain.  EXAM: CHEST - 2 VIEW  COMPARISON:  08/09/2012  FINDINGS: Cardiomediastinal silhouette projects within normal limits in size and contour. No confluent airspace disease, pneumothorax, or pleural effusion.  No displaced fracture.  Unremarkable appearance of the upper abdomen.  IMPRESSION: No radiographic evidence of acute cardiopulmonary disease.  Signed,  Yvone Neu. Loreta Ave, DO  [facility]  [facility]   Electronically Signed   By: Gilmer Mor D.O.   On: 12/05/2014 13:18   Ct Abdomen Pelvis W Contrast  12/05/2014   CLINICAL DATA:  35 year old male with a history of abdominal pain and vomiting  EXAM: CT ABDOMEN AND PELVIS WITH CONTRAST  TECHNIQUE: Multidetector CT  imaging of the abdomen and pelvis was performed using the standard protocol following bolus administration of intravenous contrast.  CONTRAST:  100mL OMNIPAQUE IOHEXOL 300 MG/ML  SOLN  COMPARISON:  05/29/2014  FINDINGS: Lower chest:  Left-sided gynecomastia.  Heart size within normal limits.  No pericardial fluid/thickening.  No lower mediastinal adenopathy.  Unremarkable appearance of the distal esophagus.  No hiatal hernia.  No confluent airspace disease, pleural fluid, or pneumothorax within visualized lung. New  Abdomen/pelvis:  Unremarkable appearance of liver, spleen, bilateral adrenal glands.  Unremarkable appearance of the pancreas.  Surgical changes of cholecystectomy.  No abnormally distended small bowel or colon.  Normal appendix.  No significant inflammatory changes of the mesenteric. No free intraperitoneal air or significant free fluid.  No mesenteric adenopathy.  No retroperitoneal adenopathy.  Punctate calcification in the upper pole collecting system of the right kidney again noted. No evidence of right-sided hydronephrosis. Unremarkable appearance of the course of the right ureter.  No evidence of left-sided hydronephrosis or nephrolithiasis. Unremarkable appearance of the course of the left ureter.  Unremarkable appearance of the urinary bladder.  Vascular: Unremarkable course caliber and contour of the abdominal aorta and visualized mesenteric vasculature.  Musculoskeletal:  No displaced fracture.  No significant degenerative changes.  IMPRESSION: No acute finding of the abdomen/pelvis CT to account for the patient's symptoms. Normal appendix.  Incidental findings as above.  Signed,  Yvone NeuJaime S. Loreta AveWagner, DO  [facility]  [facility]   Electronically Signed   By: Gilmer MorJaime  Wagner D.O.   On: 12/05/2014 13:59     EKG Interpretation   Date/Time:  Friday December 05 2014 12:02:44 EDT Ventricular Rate:  47 PR Interval:  206 QRS Duration: 108 QT Interval:   456 QTC Calculation: 403 R Axis:   53 Text Interpretation:  Sinus or ectopic atrial bradycardia Atrial premature  complex ST elevation suggests acute pericarditis no change from old.  Confirmed by Donnald GarrePfeiffer, MD, Lebron ConnersMarcy [phone]) on 12/05/2014 12:07:26 PM       2:04 PM Patient vomited all over floor. Zofran ordered.  3:18 PM This provider re-assessed the patient. Patient reported that the pain has improved, but continues to feel nauseous. Patient continues to dry heave.   3:30 PM This provider spoke with Dr. Darnelle Catalanama, triad hospitalist. Discussed case, labs, imaging, ED course in great detail. Patient to be admitted for intractable vomiting, MedSurg floor.  MDM   Final diagnoses:  Intractable vomiting with nausea, vomiting of unspecified type  Abdominal pain, unspecified abdominal location    Medications  pantoprazole (PROTONIX) injection 40 mg (not administered)  ondansetron (ZOFRAN) injection 4 mg (4 mg Intravenous Given 12/05/14 1124)  sodium chloride 0.9 % bolus 1,000 mL (0 mLs Intravenous Stopped 12/05/14 1245)  sodium chloride 0.9 % bolus 1,000 mL (0 mLs Intravenous Stopped 12/05/14 1531)  iohexol (OMNIPAQUE) 300 MG/ML solution 100 mL (100 mLs Intravenous Contrast Given 12/05/14 1322)  morphine 4 MG/ML injection 4 mg (4 mg Intravenous Given 12/05/14 1409)  ondansetron (ZOFRAN) injection 4 mg (0 mg Intravenous Duplicate 12/05/14 1503)  ondansetron (ZOFRAN) injection 4 mg (4 mg Intravenous Given 12/05/14 1409)    Filed Vitals:   12/05/14 1026 12/05/14 1254 12/05/14 1532  BP: 154/90 132/69 145/72  Pulse: 59 47 48  Temp: 98.3 F (36.8 C) 97.7 F (36.5 C)   TempSrc: Oral Rectal   Resp: 20 22 14   SpO2: 100% 100% 100%   This provider reviewed the patient's chart. Patient was seen and assessed in ED setting  on 12/03/2014 regarding abdominal pain. Patient was given pain medications in ED setting was discharged home. Patient is been seen in the ED regarding chronic abdominal pain in the  past. EKG noticed sinus or ectopic atrial bradycardia with heart rate of 47 bpm, no change from previous. Troponin negative elevation. CBC negative elevated leukocytosis. Hemoglobin 14.7, hematocrit 44.6. CMP unremarkable. Lipase negative elevation. Negative elevated liver enzymes. Ethanol negative elevation. Salicylate level negative elevation. Acetaminophen level negative elevation. Urinalysis noted small hemoglobin with negative nitrites or leukocytes identified. Urine drug screen positive for cannabis. Chest x-ray no evidence of acute cardiopulmonary disease. CT abdomen pelvis with contrast no acute finding of abdomen and pelvis to count for the patient's symptoms, normal appendix. Patient has history of chronic abdominal pain since 2011 after cholecystectomy-CT abdomen pelvis with contrast unremarkable. Labs reassuring. Patient given IV fluids and antiemetics in ED setting without relief vomiting. Patient continues to have emesis in ED setting. Vomiting is intractable-patient to be admitted for intractable vomiting. Suspicion is possible cyclic vomiting syndrome, can be secondary to cannabis. Patient to be admitted to MedSurg floor. Discussed plan for admission with patient agrees to plan of care. Patient stable for transfer to floor.   Raymon Mutton, PA-C 12/05/14 1541  Arby Barrette, MD 12/12/14 [phone]

## 2014-12-05 NOTE — ED Notes (Signed)
Bed: WA20 Expected date:  Expected time:  Means of arrival:  Comments: Ems- abd pain 

## 2014-12-05 NOTE — ED Notes (Signed)
Patient transported to CT 

## 2014-12-05 NOTE — ED Notes (Signed)
Patient is aware we need a urine specimen. 

## 2014-12-05 NOTE — ED Notes (Signed)
Per EMS - patient presents from home.  Pt had his gall bladder removed 2 years ago and has had intermittent N/V and abdominal pain since that time. Patient seen here 2 days ago with abd pain, N/V and presents today for same.  Patient's vital on scene, 162/62, HR 76, RR 16, 112 CBG.

## 2014-12-05 NOTE — ED Notes (Signed)
Attempted to call report to 3W. RN unable to take report at this time. Will call back. 

## 2014-12-05 NOTE — ED Notes (Signed)
Patient transported to X-ray 

## 2014-12-05 NOTE — Progress Notes (Signed)
CM spoke with pt who [place]. CM discussed and provided written information for self pay pcps, importance of pcp for f/u care, www.needymeds.org, www.goodrx.com, discounted pharmacies and other [place] resources such as [facility], [facility], affordable care act,  financial assistance, DSS and  health department  Reviewed resources for [place] self pay pcps like [facility] [place], family medicine at [address], [facility] family practice, general medical clinics, [facility] plus others, medication resources, [facility] out patient pharmacies and housing Pt voiced understanding and appreciation of resources provided   Provided [facility] contact information Pt agrees to referral.  Cm completed referral Pt unaware of orange card Pt informed imaging staff his gall bladder had been removed but believes he still has his appendix.  Pt given an extra warm blanket CM encouraged pt to answer [facility] call or post card or [facility] on any Wednesday to get assist with orange card

## 2014-12-05 NOTE — H&P (Addendum)
History and Physical:    Matthew Scott   XLK:[id number] DOB: Dec 29, 1980 DOA: 12/05/2014  Referring provider:  Raymon Mutton, PA-C PCP: No PCP Per Patient   Chief Complaint: Nausea, vomiting and abdominal pain  History of Present Illness:   Matthew Scott is an 34 y.o. male with a PMH of gastritis/gastroparesis status post gastric emptying study 02/17/2011 which showed delayed gastric emptying, 44% residual at 120 minutes (normal less than 30%), not currently on therapy for this,who presents with a 5 day history of bilateral lower abdominal pain, described as crampy and sharp, constant, rated 10/10 at its worst but varies from 8-10.  Symptoms have been associated with 2 episodes of nausea and vomiting yesterday, and 7 today, no hematemesis.  No diarrhea.  Had a normal bowel movement today.  Reports daily marijuana use, and says getting in a hot tub makes his nausea better.  He has similar symptoms about every 6-7 months.  ROS:   Constitutional: + fever, + chills;  Appetite diminished; No weight loss, no weight gain, + fatigue.  HEENT: No blurry vision, no diplopia, no pharyngitis, no dysphagia CV: + chest pain, no palpitations, no PND, no orthopnea, no edema.  Resp: + SOB, no cough, no pleuritic pain. GI: + nausea, + vomiting, no diarrhea, no melena, no hematochezia, no constipation, no abdominal pain.  GU: No dysuria, no hematuria, no frequency, no urgency. MSK: no myalgias, no arthralgias.  Neuro:  No headache, no focal neurological deficits, no history of seizures.  Psych: No depression, no anxiety.  Endo: No heat intolerance, no cold intolerance, no polyuria, no polydipsia  Skin: No rashes, no skin lesions.  Heme: No easy bruising.  Travel history: No recent travel.   Past Medical History:   Past Medical History  Diagnosis Date  . Asthma   . Chronic abdominal pain   . Nausea and vomiting     chronic, recurrent  . Polysubstance abuse   . Gastroparesis   . HELICOBACTER PYLORI  INFECTION 12/14/2009    Qualifier: Diagnosis of  By: Levon Hedger    . 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 GASTRITIS 12/09/2009    Qualifier: Diagnosis of  By: Daphine Deutscher FNP, Zena Amos      Past Surgical History:   Past Surgical History  Procedure Laterality Date  . Cholecystectomy  11/2009    Social History:   History   Social History  . Marital Status: Single    Spouse Name: N/A  . Number of Children: N/A  . Years of Education: N/A   Occupational History  . Unemployed.    Social History Main Topics  . Smoking status: Current Every Day Smoker  . Smokeless tobacco: Never Used  . Alcohol Use: 0.0 oz/week    0 Standard drinks or equivalent per week  . Drug Use: Yes    Special: Marijuana     Comment: Marijuana  . Sexual Activity: Yes   Other Topics Concern  . Not on file   Social History Narrative   Single, lives with parents.  Ambulatory.    Family history:   Family History  Problem Relation Age of Onset  . Other Father   . Diabetes Father   . Hypertension Father     Allergies   Review of patient's allergies indicates no known allergies.  Current Medications:   Prior to Admission medications   Medication Sig Start Date End Date Taking? Authorizing Provider  acetaminophen (TYLENOL) 500 MG tablet Take 500 mg by mouth every 6 (six) hours as needed.   Yes Historical Provider, MD  hydrocortisone cream 1 % Apply to affected area 2 times daily Patient not taking: Reported on 12/03/2014 05/18/14   Kathrynn Speed, PA-C  promethazine (PHENERGAN) 25 MG tablet Take 1 tablet (25 mg total) by mouth every 6 (six) hours as needed for nausea or vomiting. Patient not taking: Reported on 12/05/2014 05/29/14   Samuel Jester, DO    Physical Exam:   Filed Vitals:   12/05/14 1026 12/05/14 1254 12/05/14 1532 12/05/14 1627  BP: 154/90 132/69 145/72 145/72  Pulse: 59 47 48 53  Temp: 98.3 F (36.8 C) 97.7 F (36.5 C)      TempSrc: Oral Rectal    Resp: 20 22 14    SpO2: 100% 100% 100% 100%     Physical Exam: Blood pressure 145/72, pulse 53, temperature 97.7 F (36.5 C), temperature source Rectal, resp. rate 14, SpO2 100 %. Gen: No acute distress. Lethargic, falls asleep during my attempts to interview and examine him. Head: Normocephalic, atraumatic. Eyes: Sclerae nonicteric. Mouth: Oropharynx clear. Neck: Supple, no thyromegaly, no lymphadenopathy, no jugular venous distention. Chest: Lungs diminished. CV: Heart sounds are bradycardiac/regular. No murmurs, rubs, or gallops. Abdomen: Soft, slightly tender, nondistended with normal active bowel sounds. Extremities: Extremities are without clubbing, edema, or cyanosis. Skin: Warm and dry. Neuro: Lethargic, grossly nonfocal. Psych: Mood and affect normal.   Data Review:    Labs: Basic Metabolic Panel:  Recent Labs Lab 12/03/14 0857 12/05/14 1042  NA 141 141  K 3.2* 3.7  CL 106 107  CO2 28 25  GLUCOSE 103* 110*  BUN 11 11  CREATININE 0.98 1.07  CALCIUM 9.1 9.7   Liver Function Tests:  Recent Labs Lab 12/03/14 0857 12/05/14 1042  AST 26 34  ALT 15 19  ALKPHOS 61 79  BILITOT 1.3* 1.0  PROT 7.1 8.1  ALBUMIN 4.1 4.7    Recent Labs Lab 12/03/14 0857 12/05/14 1042  LIPASE 18 19   CBC:  Recent Labs Lab 12/03/14 0857 12/05/14 1042  WBC 6.0 4.9  NEUTROABS 3.8 2.2  HGB 13.2 14.7  HCT 40.4 44.6  MCV 97.3 97.4  PLT 205 217   Cardiac Enzymes:  Recent Labs Lab 12/05/14 1200  TROPONINI <0.03    Radiographic Studies: Dg Chest 2 View  12/05/2014   CLINICAL DATA:  34 year old male with a history of vomiting and abdominal pain.  EXAM: CHEST - 2 VIEW  COMPARISON:  08/09/2012  FINDINGS: Cardiomediastinal silhouette projects within normal limits in size and contour. No confluent airspace disease, pneumothorax, or pleural effusion.  No displaced fracture.  Unremarkable appearance of the upper abdomen.  IMPRESSION: No  radiographic evidence of acute cardiopulmonary disease.  Signed,  Yvone Neu. Loreta Ave, DO  [facility]  [facility]   Electronically Signed   By: Gilmer Mor D.O.   On: 12/05/2014 13:18   Ct Abdomen Pelvis W Contrast  12/05/2014   CLINICAL DATA:  34 year old male with a history of abdominal pain and vomiting  EXAM: CT ABDOMEN AND PELVIS WITH CONTRAST  TECHNIQUE: Multidetector CT imaging of the abdomen and pelvis was performed using the standard protocol following bolus administration of intravenous contrast.  CONTRAST:  OMNIPAQUE IOHEXOL 300 MG/ML  SOLN  COMPARISON:  05/29/2014  FINDINGS: Lower chest:  Left-sided gynecomastia.  Heart size within normal limits.  No pericardial fluid/thickening.  No lower mediastinal adenopathy.  Unremarkable  appearance of the distal esophagus.  No hiatal hernia.  No confluent airspace disease, pleural fluid, or pneumothorax within visualized lung. New  Abdomen/pelvis:  Unremarkable appearance of liver, spleen, bilateral adrenal glands.  Unremarkable appearance of the pancreas.  Surgical changes of cholecystectomy.  No abnormally distended small bowel or colon.  Normal appendix.  No significant inflammatory changes of the mesenteric. No free intraperitoneal air or significant free fluid.  No mesenteric adenopathy.  No retroperitoneal adenopathy.  Punctate calcification in the upper pole collecting system of the right kidney again noted. No evidence of right-sided hydronephrosis. Unremarkable appearance of the course of the right ureter.  No evidence of left-sided hydronephrosis or nephrolithiasis. Unremarkable appearance of the course of the left ureter.  Unremarkable appearance of the urinary bladder.  Vascular: Unremarkable course caliber and contour of the abdominal aorta and visualized mesenteric vasculature.  Musculoskeletal:  No displaced fracture.  No significant degenerative changes.  IMPRESSION: No acute finding of the  abdomen/pelvis CT to account for the patient's symptoms. Normal appendix.  Incidental findings as above.  Signed,  Yvone Neu. Loreta Ave, DO  [facility]  [facility]   Electronically Signed   By: Gilmer Mor D.O.   On: 12/05/2014 13:59    EKG: Independently reviewed. Sinus bradycardia at 47 bpm, premature atrial contractions. Question diffuse ST elevations as an pericarditis, these changes are not new and likely represent early repolarization.   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 - Treat conservatively with IV fluids, benzodiazepines if needed. - Avoid opiates given history of polysubstance abuse. - We'll also give Reglan ATC for history of delayed gastric emptying. - Bowel rest with clear liquid diet until symptoms improved. - PPI started.  Active Problems:   Abdominal pain - Avoid opiates. Can use Toradol as needed.    Polysubstance abuse - Urine drug screen positive for opiates and THC. - Social work consultation for substance abuse counseling.    Gastroparesis - Reglan started.    DVT prophylaxis - Lovenox ordered.  Code Status: Full. Family Communication: No family at the bedside. Father, Jaylee Sanon ([phone]) updated by telephone. Disposition Plan: Home when stable, likely in 24 hours, if N/V controlled.  Time spent: 1 hour.  Senon Nixon [facility] Pager [phone] Cell: [phone]   If 7PM-7AM, please contact night-coverage [email or web address] Password [facility] 12/05/2014, 4:42 PM

## 2014-12-06 DIAGNOSIS — R1115 Cyclical vomiting syndrome unrelated to migraine: Secondary | ICD-10-CM | POA: Diagnosis present

## 2014-12-06 MED ORDER — KETOROLAC TROMETHAMINE 30 MG/ML IJ SOLN
30.0000 mg | Freq: Four times a day (QID) | INTRAMUSCULAR | Status: DC | PRN
  Administered 2014-12-06 – 2014-12-07 (×5): 30 mg via INTRAVENOUS
  Filled 2014-12-06 (×4): qty 1

## 2014-12-06 MED ORDER — ALUM & MAG HYDROXIDE-SIMETH 200-200-20 MG/5ML PO SUSP: 15.0000 mL | ORAL | Status: DC | PRN

## 2014-12-06 MED ORDER — MORPHINE SULFATE 2 MG/ML IJ SOLN
2.0000 mg | INTRAMUSCULAR | Status: DC | PRN
  Administered 2014-12-06: 2 mg via INTRAVENOUS
  Filled 2014-12-06: qty 1

## 2014-12-06 NOTE — Progress Notes (Signed)
Utilization Review completed.  

## 2014-12-06 NOTE — Progress Notes (Signed)
[facility] PROGRESS NOTE  Matthew Scott WJX:[id number] DOB: Jan 07, 1981 DOA: 12/05/2014 PCP: No PCP Per Patient  Assessment/Plan: 1. Intractable vomiting-  secondary to cannabis hyperemesis syndrome. Patient has history of chronic marijuana abuse. Currently he is feeling better with the Reglan, Protonix. 2. Abdominal pain- patient says that Toradol is not holding of the pain. Urine drug screen positive for opiates. We'll not use opiates at this time. Continue Toradol 30 mg IV every 6 hours when necessary. 3. Polysubstance abuse- urine drug screen positive for opiates and THC. Social worker follow 4. Gastroparesis-continue Reglan 5. DVT prophylaxis- Lovenox  Code Status: Full code Family Communication: No family at bedside Disposition Plan: Home when medically stable   Consultants:  None   Procedure None  Antiibiotics: None    HPI/Subjective: 34 y.o. male with a PMH of gastritis/gastroparesis status post gastric emptying study 02/17/2011 which showed delayed gastric emptying, 44% residual at 120 minutes (normal less than 30%), not currently on therapy for this,who presents with a 5 day history of bilateral lower abdominal pain, described as crampy and sharp, constant, rated 10/10 at its worst but varies from 8-10. Symptoms have been associated with 2 episodes of nausea and vomiting yesterday, and 7 today, no hematemesis. No diarrhea. Had a normal bowel movement today. Reports daily marijuana use, and says getting in a hot tub makes his nausea better.  This morning patient feels better. Still had vomiting but the frequency of vomiting episodes has come down.  Objective: Filed Vitals:   12/06/14 0444  BP: 117/58  Pulse: 60  Temp: 98.5 F (36.9 C)  Resp: 16    Intake/Output Summary (Last 24 hours) at 12/06/14 1134 Last data filed at 12/06/14 0550  Gross per 24 hour  Intake 2997.5 ml  Output      0 ml  Net 2997.5 ml   There were no vitals filed for this  visit.  Exam:   General:  Appears in no acute distress   CardiovasculS1-S2 regular  Respiratory: Clear to auscultation bilaterally  Abdomen:  soft, nontender no organomegaly   Musculoskeletal:  no edema of the lower extremities noted    Data Reviewed: Basic Metabolic Panel:  Recent Labs Lab 12/03/14 0857 12/05/14 1042  NA 141 141  K 3.2* 3.7  CL 106 107  CO2 28 25  GLUCOSE 103* 110*  BUN 11 11  CREATININE 0.98 1.07  CALCIUM 9.1 9.7   Liver Function Tests:  Recent Labs Lab 12/03/14 0857 12/05/14 1042  AST 26 34  ALT 15 19  ALKPHOS 61 79  BILITOT 1.3* 1.0  PROT 7.1 8.1  ALBUMIN 4.1 4.7    Recent Labs Lab 12/03/14 0857 12/05/14 1042  LIPASE 18 19   No results for input(s): AMMONIA in the last 168 hours. CBC:  Recent Labs Lab 12/03/14 0857 12/05/14 1042  WBC 6.0 4.9  NEUTROABS 3.8 2.2  HGB 13.2 14.7  HCT 40.4 44.6  MCV 97.3 97.4  PLT 205 217   Cardiac Enzymes:  Recent Labs Lab 12/05/14 1200  TROPONINI <0.03    Studies: Dg Chest 2 View  12/05/2014   CLINICAL DATA:  34 year old male with a history of vomiting and abdominal pain.  EXAM: CHEST - 2 VIEW  COMPARISON:  08/09/2012  FINDINGS: Cardiomediastinal silhouette projects within normal limits in size and contour. No confluent airspace disease, pneumothorax, or pleural effusion.  No displaced fracture.  Unremarkable appearance of the upper abdomen.  IMPRESSION: No radiographic evidence of acute cardiopulmonary disease.  Signed,  Yvone Neu. Loreta Ave, DO  [facility]  [facility]   Electronically Signed   By: Gilmer Mor D.O.   On: 12/05/2014 13:18   Ct Abdomen Pelvis W Contrast  12/05/2014   CLINICAL DATA:  34 year old male with a history of abdominal pain and vomiting  EXAM: CT ABDOMEN AND PELVIS WITH CONTRAST  TECHNIQUE: Multidetector CT imaging of the abdomen and pelvis was performed using the standard protocol following bolus administration of  intravenous contrast.  CONTRAST:  OMNIPAQUE IOHEXOL 300 MG/ML  SOLN  COMPARISON:  05/29/2014  FINDINGS: Lower chest:  Left-sided gynecomastia.  Heart size within normal limits.  No pericardial fluid/thickening.  No lower mediastinal adenopathy.  Unremarkable appearance of the distal esophagus.  No hiatal hernia.  No confluent airspace disease, pleural fluid, or pneumothorax within visualized lung. New  Abdomen/pelvis:  Unremarkable appearance of liver, spleen, bilateral adrenal glands.  Unremarkable appearance of the pancreas.  Surgical changes of cholecystectomy.  No abnormally distended small bowel or colon.  Normal appendix.  No significant inflammatory changes of the mesenteric. No free intraperitoneal air or significant free fluid.  No mesenteric adenopathy.  No retroperitoneal adenopathy.  Punctate calcification in the upper pole collecting system of the right kidney again noted. No evidence of right-sided hydronephrosis. Unremarkable appearance of the course of the right ureter.  No evidence of left-sided hydronephrosis or nephrolithiasis. Unremarkable appearance of the course of the left ureter.  Unremarkable appearance of the urinary bladder.  Vascular: Unremarkable course caliber and contour of the abdominal aorta and visualized mesenteric vasculature.  Musculoskeletal:  No displaced fracture.  No significant degenerative changes.  IMPRESSION: No acute finding of the abdomen/pelvis CT to account for the patient's symptoms. Normal appendix.  Incidental findings as above.  Signed,  Yvone Neu. Loreta Ave, DO  [facility]  [facility]   Electronically Signed   By: Gilmer Mor D.O.   On: 12/05/2014 13:59    Scheduled Meds: . enoxaparin (LOVENOX) injection  40 mg Subcutaneous Q24H  . metoCLOPramide (REGLAN) injection  5 mg Intravenous 4 times per day  . pantoprazole (PROTONIX) IV  40 mg Intravenous Q24H   Continuous Infusions: . 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 If 7PM-7AM, please contact night-coverage at [email or web address], password [facility] 12/06/2014, 11:34 AM

## 2014-12-06 NOTE — Progress Notes (Signed)
At change of shift patient was observed taking a shower. He came out of the shower at approximately 2100. Hear IV beeping a few minutes ago and patient was again taking a shower. IV line was laying on the floor. Despite being told by staff to call for assistance to disconnect IV line, patient continues to disconnect line necessitating frequent line changes. As per off going RN, MD spoke with patient today re: disconnecting his IV line but as mentioned above . patient continues to do so.

## 2014-12-07 LAB — BASIC METABOLIC PANEL
Anion gap: 6 (ref 5–15)
BUN: 19 mg/dL (ref 6–23)
CO2: 26 mmol/L (ref 19–32)
Calcium: 9.3 mg/dL (ref 8.4–10.5)
Chloride: 109 mmol/L (ref 96–112)
Creatinine, Ser: 1.14 mg/dL (ref 0.50–1.35)
GFR calc Af Amer: >90 mL/min (ref >90)
GFR calc non Af Amer: 82 mL/min — ABNORMAL LOW (ref >90)
Glucose, Bld: 101 mg/dL — ABNORMAL HIGH (ref 70–99)
Potassium: 3.6 mmol/L (ref 3.5–5.1)
Sodium: 141 mmol/L (ref 135–145)

## 2014-12-07 LAB — CBC
HCT: 38.7 % — ABNORMAL LOW (ref 39.0–52.0)
Hemoglobin: 12.9 g/dL — ABNORMAL LOW (ref 13.0–17.0)
MCH: 32.6 pg (ref 26.0–34.0)
MCHC: 33.3 g/dL (ref 30.0–36.0)
MCV: 97.7 fL (ref 78.0–100.0)
Platelets: 198 10*3/uL (ref 150–400)
RBC: 3.96 MIL/uL — ABNORMAL LOW (ref 4.22–5.81)
RDW: 12.3 % (ref 11.5–15.5)
WBC: 9.4 10*3/uL (ref 4.0–10.5)

## 2014-12-07 MED ORDER — PROMETHAZINE HCL 25 MG/ML IJ SOLN
25.0000 mg | Freq: Four times a day (QID) | INTRAMUSCULAR | Status: DC | PRN
Start: 2014-12-07 — End: 2014-12-08
  Administered 2014-12-07: 25 mg via INTRAVENOUS
  Filled 2014-12-07: qty 1

## 2014-12-07 MED ORDER — METOCLOPRAMIDE HCL 5 MG/ML IJ SOLN
10.0000 mg | Freq: Four times a day (QID) | INTRAMUSCULAR | Status: DC
Start: 2014-12-07 — End: 2014-12-08
  Administered 2014-12-07 – 2014-12-08 (×4): 10 mg via INTRAVENOUS
  Filled 2014-12-07 (×4): qty 2

## 2014-12-07 NOTE — Progress Notes (Signed)
[facility] PROGRESS NOTE  Matthew Scott ZOX:[id number] DOB: 08/15/1981 DOA: 12/05/2014 PCP: No PCP Per Patient  Assessment/Plan: 1. Intractable vomiting-  secondary to cannabis hyperemesis syndrome. Patient has history of chronic marijuana abuse. Currently he is having  Vomiting. On  reglan, protonix. Will change zofran to phenergan 25 mg iv q 6 hr prn. 2. Abdominal pain- patient says that Toradol is not holding of the pain. Urine drug screen positive for opiates. We'll not use opiates at this time. Continue Toradol 30 mg IV every 6 hours when necessary. 3. Polysubstance abuse- urine drug screen positive for opiates and THC. Social worker follow 4. Gastroparesis-continue reglan 5. DVT prophylaxis- Lovenox  Code Status: Full code Family Communication: No family at bedside Disposition Plan: Home when medically stable   Consultants:  None   Procedure None  Antiibiotics: None    HPI/Subjective: 34 y.o. male with a PMH of gastritis/gastroparesis status post gastric emptying study 02/17/2011 which showed delayed gastric emptying, 44% residual at 120 minutes (normal less than 30%), not currently on therapy for this,who presents with a 5 day history of bilateral lower abdominal pain, described as crampy and sharp, constant, rated 10/10 at its worst but varies from 8-10. Symptoms have been associated with 2 episodes of nausea and vomiting yesterday, and 7 today, no hematemesis. No diarrhea. Had a normal bowel movement today. Reports daily marijuana use, and says getting in a hot tub makes his nausea better.  He continues to have vomiting.   Objective: Filed Vitals:   12/07/14 0513  BP: 118/59  Pulse: 56  Temp: 98.4 F (36.9 C)  Resp: 16    Intake/Output Summary (Last 24 hours) at 12/07/14 1456 Last data filed at 12/07/14 1000  Gross per 24 hour  Intake 1356.8 ml  Output   1003 ml  Net  353.8 ml   There were no vitals filed for this visit.  Exam:   General:   Appears in no acute distress   CardiovasculS1-S2 regular  Respiratory: Clear to auscultation bilaterally  Abdomen:  soft, nontender no organomegaly   Musculoskeletal:  no edema of the lower extremities noted    Data Reviewed: Basic Metabolic Panel:  Recent Labs Lab 12/03/14 0857 12/05/14 1042  NA 141 141  K 3.2* 3.7  CL 106 107  CO2 28 25  GLUCOSE 103* 110*  BUN 11 11  CREATININE 0.98 1.07  CALCIUM 9.1 9.7   Liver Function Tests:  Recent Labs Lab 12/03/14 0857 12/05/14 1042  AST 26 34  ALT 15 19  ALKPHOS 61 79  BILITOT 1.3* 1.0  PROT 7.1 8.1  ALBUMIN 4.1 4.7    Recent Labs Lab 12/03/14 0857 12/05/14 1042  LIPASE 18 19   No results for input(s): AMMONIA in the last 168 hours. CBC:  Recent Labs Lab 12/03/14 0857 12/05/14 1042  WBC 6.0 4.9  NEUTROABS 3.8 2.2  HGB 13.2 14.7  HCT 40.4 44.6  MCV 97.3 97.4  PLT 205 217   Cardiac Enzymes:  Recent Labs Lab 12/05/14 1200  TROPONINI <0.03    Studies: No results found.  Scheduled Meds: . enoxaparin (LOVENOX) injection  40 mg Subcutaneous Q24H  . metoCLOPramide (REGLAN) injection  5 mg Intravenous 4 times per day  . pantoprazole (PROTONIX) IV  40 mg Intravenous Q24H   Continuous Infusions: . 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 If 7PM-7AM, please contact night-coverage at [email or web address], password [facility] 12/07/2014, 2:56 PM  LOS: 1 day

## 2014-12-08 MED ORDER — METOCLOPRAMIDE HCL 5 MG PO TABS: 5.0000 mg | ORAL_TABLET | Freq: Three times a day (TID) | ORAL | Status: DC | PRN

## 2014-12-08 MED ORDER — PROMETHAZINE HCL 25 MG PO TABS: 25.0000 mg | ORAL_TABLET | Freq: Four times a day (QID) | ORAL | Status: DC | PRN

## 2014-12-08 NOTE — Discharge Summary (Signed)
Physician Discharge Summary  Matthew Scott:[id number] DOB: 02-Feb-1981 DOA: 12/05/2014  PCP: No PCP Per Patient  Admit date: 12/05/2014 Discharge date: 12/08/2014  Time spent: 25* minutes  Recommendations for Outpatient Follow-up:  1. 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 male with a PMH of gastritis/gastroparesis status post gastric emptying study 02/17/2011 which showed delayed gastric emptying, 44% residual at 120 minutes (normal less than 30%), not currently on therapy for this,who presents with a 5 day history of bilateral lower abdominal pain, described as crampy and sharp, constant, rated 10/10 at its worst but varies from 8-10. Symptoms have been associated with 2 episodes of nausea and vomiting yesterday, and 7 today, no hematemesis. No diarrhea. Had a normal bowel movement today. Reports daily marijuana use, and says getting in a hot tub makes his nausea better. He has similar symptoms about every 6-7 months  Hospital Course:  1. Intractable vomiting- secondary to cannabis hyperemesis syndrome. Patient has history of chronic marijuana abuse. He was started on reglan, protonix. Improved with phenergan, will discharge home on reglan and phenergan prn.  2. Abdominal pain- resolved 3. Polysubstance abuse- urine drug screen positive for opiates and THC.  4. Gastroparesis-continue reglan prn  Procedures:  None  Consultations:  None  Discharge Exam: Filed Vitals:   12/08/14 0509  BP: 120/67  Pulse: 63  Temp: 98.2 F (36.8 C)  Resp: 16    General: Appear in no acute distress Cardiovascular: S1s2  RRR Respiratory: Clear bilaterally  Discharge Instructions   Discharge Instructions    Diet - low sodium heart healthy    Complete by:  As directed      Increase activity slowly    Complete by:  As directed           Current Discharge Medication List    START taking these medications   Details  metoCLOPramide (REGLAN) 5 MG tablet Take 1 tablet (5 mg total) by mouth every 8 (eight) hours as needed for nausea or refractory nausea / vomiting. Qty: 30 tablet, Refills: 0      CONTINUE these medications which have CHANGED   Details  promethazine (PHENERGAN) 25 MG tablet Take 1 tablet (25 mg total) by mouth every 6 (six) hours as needed for nausea or vomiting. Qty: 15 tablet, Refills: 0      CONTINUE these medications which have NOT CHANGED   Details  acetaminophen (TYLENOL) 500 MG tablet Take 500 mg by mouth every 6 (six) hours as needed.    hydrocortisone cream 1 % Apply to affected area 2 times daily Qty: 15 g, Refills: 0       No Known Allergies Follow-up Information    Follow up with [facility] .   Why:  Please answer [facility] call or post card or go to [facility] on any Wednesday to get assist with orange card    Contact information:   [facility] assists with discounted doctors through "orange card services Call Scherry Ran at [phone] [email or web address]      Follow up with Use list of resources provided to you  be ED case manager.   Why:  to assist with medications and finding a pcp (primary care provider) to manage ongoing concerns        The results of significant diagnostics from this hospitalization (including imaging, microbiology, ancillary and laboratory) are listed below for reference.    Significant Diagnostic Studies: Dg Chest 2 View  12/05/2014   CLINICAL DATA:  34 year old male with a history of vomiting and abdominal pain.  EXAM: CHEST - 2 VIEW  COMPARISON:  08/09/2012  FINDINGS: Cardiomediastinal  silhouette projects within normal limits in size and contour. No confluent airspace disease, pneumothorax, or pleural effusion.  No displaced fracture.  Unremarkable appearance of the upper abdomen.  IMPRESSION: No radiographic evidence of acute cardiopulmonary disease.  Signed,  Yvone Neu. Loreta Ave, DO  [facility]  [facility]   Electronically Signed   By: Gilmer Mor D.O.   On: 12/05/2014 13:18   Ct Abdomen Pelvis W Contrast  12/05/2014   CLINICAL DATA:  34 year old male with a history of abdominal pain and vomiting  EXAM: CT ABDOMEN AND PELVIS WITH CONTRAST  TECHNIQUE: Multidetector CT imaging of the abdomen and pelvis was performed using the standard protocol following bolus administration of intravenous contrast.  CONTRAST:  OMNIPAQUE IOHEXOL 300 MG/ML  SOLN  COMPARISON:  05/29/2014  FINDINGS: Lower chest:  Left-sided gynecomastia.  Heart size within normal limits.  No pericardial fluid/thickening.  No lower mediastinal adenopathy.  Unremarkable appearance of the distal esophagus.  No hiatal hernia.  No confluent airspace disease, pleural fluid, or pneumothorax within visualized lung. New  Abdomen/pelvis:  Unremarkable appearance of liver, spleen, bilateral adrenal glands.  Unremarkable appearance of the pancreas.  Surgical changes of cholecystectomy.  No abnormally distended small bowel or colon.  Normal appendix.  No significant inflammatory changes of the mesenteric. No free intraperitoneal air or significant free fluid.  No mesenteric adenopathy.  No retroperitoneal adenopathy.  Punctate calcification in the upper pole collecting system of the right kidney again noted. No evidence of right-sided hydronephrosis. Unremarkable appearance of the course of the right ureter.  No evidence of left-sided hydronephrosis or nephrolithiasis. Unremarkable appearance of the course of the left ureter.  Unremarkable appearance of the urinary bladder.  Vascular:  Unremarkable course caliber and contour of the abdominal aorta and visualized mesenteric vasculature.  Musculoskeletal:  No displaced fracture.  No significant degenerative changes.  IMPRESSION: No acute finding of the abdomen/pelvis CT to account for the patient's symptoms. Normal appendix.  Incidental findings as above.  Signed,  Yvone Neu. Loreta Ave, DO  [facility]  [facility]   Electronically Signed   By: Gilmer Mor D.O.   On: 12/05/2014 13:59    Microbiology: No results found for this or any previous visit (from the past 240 hour(s)).   Labs: Basic Metabolic Panel:  Recent Labs Lab 12/03/14 0857 12/05/14 1042 12/07/14 1553  NA 141 141 141  K 3.2* 3.7 3.6  CL 106 107 109  CO2 28 25 26   GLUCOSE 103* 110* 101*  BUN 11 11 19   CREATININE 0.98 1.07 1.14  CALCIUM 9.1 9.7 9.3   Liver Function Tests:  Recent Labs Lab 12/03/14 0857 12/05/14 1042  AST 26 34  ALT 15 19  ALKPHOS 61 79  BILITOT 1.3* 1.0  PROT 7.1 8.1  ALBUMIN 4.1 4.7    Recent Labs Lab 12/03/14 0857 12/05/14 1042  LIPASE 18 19   No results for input(s): AMMONIA in the last 168  hours. CBC:  Recent Labs Lab 12/03/14 0857 12/05/14 1042 12/07/14 1553  WBC 6.0 4.9 9.4  NEUTROABS 3.8 2.2  --   HGB 13.2 14.7 12.9*  HCT 40.4 44.6 38.7*  MCV 97.3 97.4 97.7  PLT 205 217 198   Cardiac Enzymes:  Recent Labs Lab 12/05/14 1200  TROPONINI <0.03   BNP: BNP (last 3 results) No results for input(s): BNP in the last 8760 hours.  ProBNP (last 3 results) No results for input(s): PROBNP in the last 8760 hours.  CBG: No results for input(s): GLUCAP in the last 168 hours.     SignedMauro Kaufmann S  [facility] 12/08/2014, 11:46 AM

## 2014-12-08 NOTE — Progress Notes (Signed)
Pt discharged home with father in stable condition. Discharge instructions and scripts given. Pt verbalized understanding.  

## 2014-12-08 NOTE — Progress Notes (Signed)
CSW received referral for current substance abuse.  CSW attempted to assess, but pt had already d/c home before CSW able to assess.  CSW signing off.   Loletta SpecterSuzanna Coburn Knaus, MSW, LCSW Clinical Social Work [phone]

## 2014-12-12 ENCOUNTER — Emergency Department (HOSPITAL_COMMUNITY)
Admission: EM | Admit: 2014-12-12 | Discharge: 2014-12-12 | Disposition: A | Discharge status: Home / Self Care | Payer: Self-pay | Attending: Emergency Medicine | Admitting: Emergency Medicine

## 2014-12-12 ENCOUNTER — Encounter (HOSPITAL_COMMUNITY): Payer: Self-pay

## 2014-12-12 DIAGNOSIS — G43A0 Cyclical vomiting, not intractable: Secondary | ICD-10-CM | POA: Insufficient documentation

## 2014-12-12 DIAGNOSIS — Z720 Tobacco use: Secondary | ICD-10-CM | POA: Insufficient documentation

## 2014-12-12 DIAGNOSIS — Z8719 Personal history of other diseases of the digestive system: Secondary | ICD-10-CM | POA: Insufficient documentation

## 2014-12-12 DIAGNOSIS — R1084 Generalized abdominal pain: Secondary | ICD-10-CM

## 2014-12-12 DIAGNOSIS — R1115 Cyclical vomiting syndrome unrelated to migraine: Secondary | ICD-10-CM

## 2014-12-12 DIAGNOSIS — Z9049 Acquired absence of other specified parts of digestive tract: Secondary | ICD-10-CM | POA: Insufficient documentation

## 2014-12-12 DIAGNOSIS — Z7952 Long term (current) use of systemic steroids: Secondary | ICD-10-CM | POA: Insufficient documentation

## 2014-12-12 DIAGNOSIS — G8929 Other chronic pain: Secondary | ICD-10-CM | POA: Insufficient documentation

## 2014-12-12 DIAGNOSIS — J45909 Unspecified asthma, uncomplicated: Secondary | ICD-10-CM | POA: Insufficient documentation

## 2014-12-12 DIAGNOSIS — Z8619 Personal history of other infectious and parasitic diseases: Secondary | ICD-10-CM | POA: Insufficient documentation

## 2014-12-12 LAB — URINALYSIS, ROUTINE W REFLEX MICROSCOPIC
Bilirubin Urine: NEGATIVE
Glucose, UA: NEGATIVE mg/dL
Hgb urine dipstick: NEGATIVE
Ketones, ur: 15 mg/dL — AB
Leukocytes, UA: NEGATIVE
Nitrite: NEGATIVE
Protein, ur: NEGATIVE mg/dL
Specific Gravity, Urine: 1.023 (ref 1.005–1.030)
Urobilinogen, UA: 0.2 mg/dL (ref 0.0–1.0)
pH: 6 (ref 5.0–8.0)

## 2014-12-12 LAB — COMPREHENSIVE METABOLIC PANEL
ALT: 24 U/L (ref 0–53)
AST: 25 U/L (ref 0–37)
Albumin: 4.2 g/dL (ref 3.5–5.2)
Alkaline Phosphatase: 59 U/L (ref 39–117)
Anion gap: 7 (ref 5–15)
BUN: 13 mg/dL (ref 6–23)
CO2: 26 mmol/L (ref 19–32)
Calcium: 9 mg/dL (ref 8.4–10.5)
Chloride: 107 mmol/L (ref 96–112)
Creatinine, Ser: 0.89 mg/dL (ref 0.50–1.35)
GFR calc Af Amer: >90 mL/min (ref >90)
GFR calc non Af Amer: >90 mL/min (ref >90)
Glucose, Bld: 129 mg/dL — ABNORMAL HIGH (ref 70–99)
Potassium: 3 mmol/L — ABNORMAL LOW (ref 3.5–5.1)
Sodium: 140 mmol/L (ref 135–145)
Total Bilirubin: 1.6 mg/dL — ABNORMAL HIGH (ref 0.3–1.2)
Total Protein: 6.9 g/dL (ref 6.0–8.3)

## 2014-12-12 LAB — CBC WITH DIFFERENTIAL/PLATELET
Basophils Absolute: 0 10*3/uL (ref 0.0–0.1)
Basophils Relative: 0 % (ref 0–1)
Eosinophils Absolute: 0.1 10*3/uL (ref 0.0–0.7)
Eosinophils Relative: 1 % (ref 0–5)
HCT: 37.1 % — ABNORMAL LOW (ref 39.0–52.0)
Hemoglobin: 12.7 g/dL — ABNORMAL LOW (ref 13.0–17.0)
Lymphocytes Relative: 18 % (ref 12–46)
Lymphs Abs: 1.4 10*3/uL (ref 0.7–4.0)
MCH: 32.2 pg (ref 26.0–34.0)
MCHC: 34.2 g/dL (ref 30.0–36.0)
MCV: 94.2 fL (ref 78.0–100.0)
Monocytes Absolute: 0.5 10*3/uL (ref 0.1–1.0)
Monocytes Relative: 7 % (ref 3–12)
Neutro Abs: 5.7 10*3/uL (ref 1.7–7.7)
Neutrophils Relative %: 74 % (ref 43–77)
Platelets: 231 10*3/uL (ref 150–400)
RBC: 3.94 MIL/uL — ABNORMAL LOW (ref 4.22–5.81)
RDW: 12 % (ref 11.5–15.5)
WBC: 7.7 10*3/uL (ref 4.0–10.5)

## 2014-12-12 MED ORDER — ONDANSETRON HCL 4 MG/2ML IJ SOLN
4.0000 mg | Freq: Once | INTRAMUSCULAR | Status: AC
  Administered 2014-12-12: 4 mg via INTRAVENOUS
  Filled 2014-12-12: qty 2

## 2014-12-12 MED ORDER — METOCLOPRAMIDE HCL 10 MG PO TABS: 10.0000 mg | ORAL_TABLET | Freq: Four times a day (QID) | ORAL | Status: DC

## 2014-12-12 MED ORDER — KETOROLAC TROMETHAMINE 30 MG/ML IJ SOLN
30.0000 mg | Freq: Once | INTRAMUSCULAR | Status: AC
  Administered 2014-12-12: 30 mg via INTRAVENOUS
  Filled 2014-12-12: qty 1

## 2014-12-12 MED ORDER — PROMETHAZINE HCL 25 MG/ML IJ SOLN
25.0000 mg | Freq: Once | INTRAMUSCULAR | Status: AC
Start: 2014-12-12 — End: 2014-12-12
  Administered 2014-12-12: 25 mg via INTRAVENOUS
  Filled 2014-12-12: qty 1

## 2014-12-12 MED ORDER — METOCLOPRAMIDE HCL 5 MG/ML IJ SOLN
10.0000 mg | Freq: Once | INTRAMUSCULAR | Status: AC
  Administered 2014-12-12: 10 mg via INTRAVENOUS
  Filled 2014-12-12: qty 2

## 2014-12-12 MED ORDER — SODIUM CHLORIDE 0.9 % IV BOLUS (SEPSIS)
1000.0000 mL | Freq: Once | INTRAVENOUS | Status: AC
  Administered 2014-12-12: 1000 mL via INTRAVENOUS

## 2014-12-12 MED ORDER — IBUPROFEN 400 MG PO TABS: 400.0000 mg | ORAL_TABLET | Freq: Four times a day (QID) | ORAL | Status: DC | PRN

## 2014-12-12 NOTE — ED Notes (Signed)
Bed: WA20 Expected date:  Expected time:  Means of arrival:  Comments: EMS-N/V 

## 2014-12-12 NOTE — ED Notes (Signed)
Pt from home.  Smoke pot and drank alcohol last night. Woke up this morning and started vomiting after brushing teeth.  Pt recently in hospital.  Has hx of chronic abdominal pain and gastroparesis.  Pt was given phenergan script but has not had it filled.  Pt dry heaving in ambulance route.  Vitals:  Hr 88, resp 18, bp 180 palp.  No meds in route.

## 2014-12-12 NOTE — Discharge Instructions (Signed)
Cyclic Vomiting Syndrome °Cyclic vomiting syndrome is a benign condition in which patients experience bouts or cycles of severe nausea and vomiting that last for hours or even days. The bouts of nausea and vomiting alternate with longer periods of no symptoms and generally good health. Cyclic vomiting syndrome occurs mostly in children, but can affect adults. °CAUSES  °CVS has no known cause. Each episode is typically similar to the previous ones. The episodes tend to:  °· Start at about the same time of day. °· Last the same length of time. °· Present the same symptoms at the same level of intensity. °Cyclic vomiting syndrome can begin at any age in children and adults. Cyclic vomiting syndrome usually starts between the ages of 3 and 7 years. In adults, episodes tend to occur less often than they do in children, but they last longer. Furthermore, the events or situations that trigger episodes in adults cannot always be pinpointed as easily as they can in children. °There are 4 phases of cyclic vomiting syndrome: °1. Prodrome. The prodrome phase signals that an episode of nausea and vomiting is about to begin. This phase can last from just a few minutes to several hours. This phase is often marked by belly (abdominal) pain. Sometimes taking medicine early in the prodrome phase can stop an episode in progress. However, sometimes there is no warning. A person may simply wake up in the middle of the night or early morning and begin vomiting. °2. Episode. The episode phase consists of: °· Severe vomiting. °· Nausea. °· Gagging (retching). °3. Recovery. The recovery phase begins when the nausea and vomiting stop. Healthy color, appetite, and energy return. °4. Symptom-free interval. The symptom-free interval phase is the period between episodes when no symptoms are present. °TRIGGERS °Episodes can be triggered by an infection or event. Examples of triggers include: °· Infections. °· Colds, allergies, sinus problems, and  the flu. °· Eating certain foods such as chocolate or cheese. °· Foods with monosodium glutamate (MSG) or preservatives. °· Fast foods. °· Pre-packaged foods. °· Foods with low nutritional value (junk foods). °· Overeating. °· Eating just before going to bed. °· Hot weather. °· Dehydration. °· Not enough sleep or poor sleep quality. °· Physical exhaustion. °· Menstruation. °· Motion sickness. °· Emotional stress (school or home difficulties). °· Excitement or stress. °SYMPTOMS  °The main symptoms of cyclic vomiting syndrome are: °· Severe vomiting. °· Nausea. °· Gagging (retching). °Episodes usually begin at night or the first thing in the morning. Episodes may include vomiting or retching up to 5 or 6 times an hour during the worst of the episode. Episodes usually last anywhere from 1 to 4 days. Episodes can last for up to 10 days. Other symptoms include: °· Paleness. °· Exhaustion. °· Listlessness. °· Abdominal pain. °· Loose stools or diarrhea. °Sometimes the nausea and vomiting are so severe that a person appears to be almost unconscious. Sensitivity to light, headache, fever, dizziness, may also accompany an episode. In addition, the vomiting may cause drooling and excessive thirst. Drinking water usually leads to more vomiting, though the water can dilute the acid in the vomit, making the episode a little less painful. Continuous vomiting can lead to dehydration, which means that the body has lost excessive water and salts. °DIAGNOSIS  °Cyclic vomiting syndrome is hard to diagnose because there are no clear tests to identify it. A caregiver must diagnose cyclic vomiting syndrome by looking at symptoms and medical history. A caregiver must exclude more common diseases   or disorders that can also cause nausea and vomiting. Also, diagnosis takes time because caregivers need to identify a pattern or cycle to the vomiting. TREATMENT  Cyclic vomiting syndrome cannot be cured. Treatment varies, but people with  cyclic vomiting syndrome should get plenty of rest and sleep and take medications that prevent, stop, or lessen the vomiting episodes and other symptoms. People whose episodes are frequent and long-lasting may be treated during the symptom-free intervals in an effort to prevent or ease future episodes. The symptom-free phase is a good time to eliminate anything known to trigger an episode. For example, if episodes are brought on by stress or excitement, this period is the time to find ways to reduce stress and stay calm. If sinus problems or allergies cause episodes, those conditions should be treated. The triggers listed above should be avoided or prevented. Because of the similarities between migraine and cyclic vomiting syndrome, caregivers treat some people with severe cyclic vomiting syndrome with drugs that are also used for migraine headaches. The drugs are designed to:  Prevent episodes.  Reduce their frequency.  Lessen their severity. HOME CARE INSTRUCTIONS Once a vomiting episode begins, treatment is supportive. It helps to stay in bed and sleep in a dark, quiet room. Severe nausea and vomiting may require hospitalization and intravenous (IV) fluids to prevent dehydration. Relaxing medications (sedatives) may help if the nausea continues. Sometimes, during the prodrome phase, it is possible to stop an episode from happening altogether. Only take over-the-counter or prescription medicines for pain, discomfort or fever as directed by your caregiver. Do not give aspirin to children. During the recovery phase, drinking water and replacing lost electrolytes (salts in the blood) are very important. Electrolytes are salts that the body needs to function well and stay healthy. Symptoms during the recovery phase can vary. Some people find that their appetites return to normal immediately, while others need to begin by drinking clear liquids and then move slowly to solid food. RELATED COMPLICATIONS The  severe vomiting that defines cyclic vomiting syndrome is a risk factor for several complications:  Dehydration--Vomiting causes the body to lose water quickly.  Electrolyte imbalance--Vomiting also causes the body to lose the important salts it needs to keep working properly.  Peptic esophagitis--The tube that connects the mouth to the stomach (esophagus) becomes injured from the stomach acid that comes up with the vomit.  Hematemesis--The esophagus becomes irritated and bleeds, so blood mixes with the vomit.  Mallory-Weiss tear--The lower end of the esophagus may tear open or the stomach may bruise from vomiting or retching.  Tooth decay--The acid in the vomit can hurt the teeth by corroding the tooth enamel. SEEK MEDICAL CARE IF: You have questions or problems. Document Released: 10/10/2001 Document Revised: 10/24/2011 Document Reviewed: 11/08/2010 [employer] Patient Information 2015 [place] [employer] [place]. This information is not intended to replace advice given to you by your health care provider. Make sure you discuss any questions you have with your health care provider.  Is important for you to follow-up with her primary care for further evaluation and management of your symptoms. You may also consider cessation of smoking weed as this appears to cause you to throw up more often. Return to ED for new or worsening symptoms.

## 2014-12-12 NOTE — ED Notes (Signed)
Pt alert x4 ambulates without distress. 

## 2014-12-12 NOTE — ED Provider Notes (Signed)
CSN: [id number]     Arrival date & time 12/12/14  0757 History   First MD Initiated Contact with Patient 12/12/14 [phone]     Chief Complaint  Patient presents with  . Emesis  . Abdominal Pain     (Consider location/radiation/quality/duration/timing/severity/associated sxs/prior Treatment) HPI Pauline AusWillie E Redondo is a 34 y.o. male history of chronic abdominal pain, gastroparesis, chronic nausea and vomiting who comes in for evaluation of abdominal discomfort with associated nausea and vomiting. Patient states this happens to him frequently, only in the mornings after he smokes weed the previous evening. He has not tried anything to improve his symptoms. He reports his discomfort is severe.He denies any fevers, chills, urinary symptoms, chest pain, shortness of breath, numbness or weakness, syncope  Past Medical History  Diagnosis Date  . Asthma   . Chronic abdominal pain   . Nausea and vomiting     chronic, recurrent  . Polysubstance abuse   . Gastroparesis   . 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 GASTRITIS 12/09/2009    Qualifier: Diagnosis of  By: Daphine DeutscherMartin FNP, Zena AmosNykedtra     Past Surgical History  Procedure Laterality Date  . Cholecystectomy  11/2009   Family History  Problem Relation Age of Onset  . Other Father   . Diabetes Father   . Hypertension Father    History  Substance Use Topics  . Smoking status: Current Every Day Smoker  . Smokeless tobacco: Never Used  . Alcohol Use: 0.0 oz/week    0 Standard drinks or equivalent per week    Review of Systems  A 10 point review of systems was completed and was negative except for pertinent positives and negatives as mentioned in the history of present illness   Allergies  Review of patient's allergies indicates no known allergies.  Home Medications   Prior to Admission medications    Medication Sig Start Date End Date Taking? Authorizing Provider  hydrocortisone cream 1 % Apply to affected area 2 times daily Patient not taking: Reported on 12/03/2014 05/18/14   Nada Boozerobyn M Hess, PA-C  ibuprofen (ADVIL,MOTRIN) 400 MG tablet Take 1 tablet (400 mg total) by mouth every 6 (six) hours as needed. 12/12/14   Joycie PeekBenjamin Smitty Ackerley, PA-C  metoCLOPramide (REGLAN) 10 MG tablet Take 1 tablet (10 mg total) by mouth every 6 (six) hours. 12/12/14   Joycie PeekBenjamin Quindon Denker, PA-C  promethazine (PHENERGAN) 25 MG tablet Take 1 tablet (25 mg total) by mouth every 6 (six) hours as needed for nausea or vomiting. Patient not taking: Reported on 12/12/2014 12/08/14   Meredeth IdeGagan S Lama, MD   BP 149/99 mmHg  Pulse 61  Temp(Src) 98 F (36.7 C) (Oral)  Resp 18  SpO2 100% Physical Exam  Constitutional: He is oriented to person, place, and time. He appears well-developed and well-nourished.  HENT:  Head: Normocephalic and atraumatic.  Mouth/Throat: Oropharynx is clear and moist.  Eyes: Conjunctivae are normal. Pupils are equal, round, and reactive to light. Right eye exhibits no discharge. Left eye exhibits no discharge. No scleral icterus.  Neck: Neck supple.  Cardiovascular: Normal rate, regular rhythm and normal heart sounds.   Pulmonary/Chest: Effort normal and breath sounds normal. No respiratory distress. He has no wheezes. He has no rales.  Abdominal: Soft. There is no tenderness.  Diffuse abdominal tenderness with positive carnett sign. No  obvious lesions or deformities. Abdomen is otherwise soft, nondistended. No peritoneal signs.  Musculoskeletal: He exhibits no tenderness.  Neurological: He is alert and oriented to person, place, and time.  Cranial Nerves II-XII grossly intact  Skin: Skin is warm and dry. No rash noted.  Psychiatric: He has a normal mood and affect.  Nursing note and vitals reviewed.   ED Course  Procedures (including critical care time) Labs Review Labs Reviewed  CBC WITH  DIFFERENTIAL/PLATELET - Abnormal; Notable for the following:    RBC 3.94 (*)    Hemoglobin 12.7 (*)    HCT 37.1 (*)    All other components within normal limits  COMPREHENSIVE METABOLIC PANEL - Abnormal; Notable for the following:    Potassium 3.0 (*)    Glucose, Bld 129 (*)    Total Bilirubin 1.6 (*)    All other components within normal limits  URINALYSIS, ROUTINE W REFLEX MICROSCOPIC - Abnormal; Notable for the following:    Ketones, ur 15 (*)    All other components within normal limits    Imaging Review No results found.   EKG Interpretation None     Meds given in ED:  Medications  ondansetron (ZOFRAN) injection 4 mg (4 mg Intravenous Given 12/12/14 0849)  ketorolac (TORADOL) 30 MG/ML injection 30 mg (30 mg Intravenous Given 12/12/14 0849)  promethazine (PHENERGAN) injection 25 mg (25 mg Intravenous Given 12/12/14 1003)  sodium chloride 0.9 % bolus 1,000 mL (0 mLs Intravenous Stopped 12/12/14 1208)  metoCLOPramide (REGLAN) injection 10 mg (10 mg Intravenous Given 12/12/14 1038)  ondansetron (ZOFRAN) injection 4 mg (4 mg Intravenous Given 12/12/14 1215)    Discharge Medication List as of 12/12/2014 11:58 AM    START taking these medications   Details  ibuprofen (ADVIL,MOTRIN) 400 MG tablet Take 1 tablet (400 mg total) by mouth every 6 (six) hours as needed., Starting 12/12/2014, Until Discontinued, Print       Filed Vitals:   12/12/14 0813 12/12/14 1127 12/12/14 1243  BP: 156/82 150/100 149/99  Pulse: 60 62 61  Temp: 98 F (36.7 C) 98 F (36.7 C)   TempSrc: Oral Oral   Resp: SpO2: 100% 100% 100%    MDM  Vitals stable - WNL -afebrile Pt resting comfortably in ED. PE--no evidence of acute or surgical abdomen. Upon multiple re-evaluations, patient is sleeping comfortably in ED in no apparent distress and without vomiting, upon awakening requests pain medicine.Loney Laurence noncontributory.  DDX--nausea, vomiting and abdominal pain likely secondary to smoking  marijuana and cyclical vomiting syndrome. No evidence of other acute or emergent pathology at this time. Encouraged cessation of marijuana usage. DC with anti-emetics and anti-inflammatories for abdominal discomfort.  I discussed all relevant lab findings and imaging results with pt and they verbalized understanding. Discussed f/u with PCP within 48 hrs and return precautions, pt very amenable to plan. Patient stable for discharge and ambulated out of the ED without difficulty.  Final diagnoses:  Non-intractable cyclical vomiting with nausea  Generalized abdominal pain       Joycie Peek, PA-C 12/12/14 1643  Donnetta Hutching, MD 12/13/14 [phone]

## 2014-12-12 NOTE — ED Notes (Signed)
Called by tech that pt c/o continued pain.  Went to assess and pt appeared to be sleeping.  Notified PA.  However, within 10 min. Noted pt is now vomiting again.  PA order given.

## 2014-12-13 ENCOUNTER — Encounter (HOSPITAL_COMMUNITY): Payer: Self-pay | Admitting: *Deleted

## 2014-12-13 ENCOUNTER — Emergency Department (HOSPITAL_COMMUNITY)
Admission: EM | Admit: 2014-12-13 | Discharge: 2014-12-13 | Disposition: A | Discharge status: Home / Self Care | Payer: Self-pay | Attending: Emergency Medicine | Admitting: Emergency Medicine

## 2014-12-13 DIAGNOSIS — Z720 Tobacco use: Secondary | ICD-10-CM | POA: Insufficient documentation

## 2014-12-13 DIAGNOSIS — Z9049 Acquired absence of other specified parts of digestive tract: Secondary | ICD-10-CM | POA: Insufficient documentation

## 2014-12-13 DIAGNOSIS — Z7952 Long term (current) use of systemic steroids: Secondary | ICD-10-CM | POA: Insufficient documentation

## 2014-12-13 DIAGNOSIS — K3184 Gastroparesis: Secondary | ICD-10-CM | POA: Insufficient documentation

## 2014-12-13 DIAGNOSIS — J45909 Unspecified asthma, uncomplicated: Secondary | ICD-10-CM | POA: Insufficient documentation

## 2014-12-13 DIAGNOSIS — Z8619 Personal history of other infectious and parasitic diseases: Secondary | ICD-10-CM | POA: Insufficient documentation

## 2014-12-13 DIAGNOSIS — G8929 Other chronic pain: Secondary | ICD-10-CM | POA: Insufficient documentation

## 2014-12-13 MED ORDER — LORAZEPAM 1 MG PO TABS: 1.0000 mg | ORAL_TABLET | Freq: Three times a day (TID) | ORAL | Status: DC | PRN

## 2014-12-13 MED ORDER — LORAZEPAM 2 MG/ML IJ SOLN
2.0000 mg | Freq: Once | INTRAMUSCULAR | Status: AC
  Administered 2014-12-13: 2 mg via INTRAMUSCULAR
  Filled 2014-12-13: qty 1

## 2014-12-13 MED ORDER — CHLORDIAZEPOXIDE HCL 25 MG PO CAPS: ORAL_CAPSULE | ORAL | Status: DC

## 2014-12-13 MED ORDER — METOCLOPRAMIDE HCL 5 MG/ML IJ SOLN
10.0000 mg | Freq: Once | INTRAMUSCULAR | Status: AC
  Administered 2014-12-13: 10 mg via INTRAMUSCULAR
  Filled 2014-12-13: qty 2

## 2014-12-13 NOTE — ED Notes (Signed)
Pt given Ginger ale to drink 

## 2014-12-13 NOTE — Discharge Instructions (Signed)

## 2014-12-13 NOTE — ED Notes (Signed)
Pt states he has lower abdominal pain worse on R side. Pt has taken Phenergan and Reglan today with no relief. Pt also c/o nausea.

## 2014-12-13 NOTE — ED Provider Notes (Signed)
CSN: [id number]     Arrival date & time 12/13/14  1540 History   First MD Initiated Contact with Patient 12/13/14 1546     Chief Complaint  Patient presents with  . Abdominal Pain  . Nausea  . Emesis     HPI Patient presents with complaints of nausea vomiting without diarrhea.  No fevers or chills.  No urinary complaints.  No chest pain shortness of breath.  Denies hematemesis.  Patient with long-standing history of gastroparesis.  Recently admitted to the hospital and discharged 6 days ago.  Presents now with recurrent nausea and vomiting despite Reglan and Phenergan at home.  Reports decreased oral intake at home.  Symptoms are moderate in severity.  Nothing worsens or improves his symptoms.  He states he has now been off cannabis for the past 3 days.   Past Medical History  Diagnosis Date  . Asthma   . Chronic abdominal pain   . Nausea and vomiting     chronic, recurrent  . Polysubstance abuse   . Gastroparesis   . 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 GASTRITIS 12/09/2009    Qualifier: Diagnosis of  By: Daphine Deutscher FNP, Zena Amos     Past Surgical History  Procedure Laterality Date  . Cholecystectomy  11/2009   Family History  Problem Relation Age of Onset  . Other Father   . Diabetes Father   . Hypertension Father    History  Substance Use Topics  . Smoking status: Current Every Day Smoker  . Smokeless tobacco: Never Used  . Alcohol Use: 0.0 oz/week    0 Standard drinks or equivalent per week    Review of Systems  All other systems reviewed and are negative.     Allergies  Review of patient's allergies indicates no known allergies.  Home Medications   Prior to Admission medications   Medication Sig Start Date End Date Taking? Authorizing Provider  hydrocortisone cream 1 % Apply to affected area 2 times daily Patient not  taking: Reported on 12/03/2014 05/18/14   Nada Boozer Hess, PA-C  ibuprofen (ADVIL,MOTRIN) 400 MG tablet Take 1 tablet (400 mg total) by mouth every 6 (six) hours as needed. Patient not taking: Reported on 12/13/2014 12/12/14   Joycie Peek, PA-C  LORazepam (ATIVAN) 1 MG tablet Take 1 tablet (1 mg total) by mouth every 8 (eight) hours as needed (nausea). 12/13/14   Azalia Bilis, MD  metoCLOPramide (REGLAN) 10 MG tablet Take 1 tablet (10 mg total) by mouth every 6 (six) hours. Patient not taking: Reported on 12/13/2014 12/12/14   Joycie Peek, PA-C  promethazine (PHENERGAN) 25 MG tablet Take 1 tablet (25 mg total) by mouth every 6 (six) hours as needed for nausea or vomiting. Patient not taking: Reported on 12/12/2014 12/08/14   Meredeth Ide, MD   BP 155/90 mmHg  Pulse 59  Temp(Src) 99 F (37.2 C) (Oral)  Resp 17  SpO2 100% Physical Exam  Constitutional: He is oriented to person, place, and time. He appears well-developed and well-nourished.  HENT:  Head: Normocephalic and atraumatic.  Eyes: EOM are normal.  Neck: Normal range of motion.  Cardiovascular: Normal rate, regular rhythm, normal heart sounds and intact distal pulses.   Pulmonary/Chest: Effort normal and breath sounds normal. No respiratory distress.  Abdominal: Soft. He exhibits no distension. There is  no tenderness. There is no rebound and no guarding.  No right upper quadrant tenderness.  No Murphy sign.  Musculoskeletal: Normal range of motion.  Neurological: He is alert and oriented to person, place, and time.  Skin: Skin is warm and dry.  Psychiatric: He has a normal mood and affect. Judgment normal.  Nursing note and vitals reviewed.   ED Course  Procedures (including critical care time) Labs Review Labs Reviewed - No data to display  Imaging Review No results found.   EKG Interpretation None      MDM   Final diagnoses:  Gastroparesis    6:04 PM Patient is feeling much better after IM Ativan and Reglan.   Keeping oral fluids down this time.  Vital signs without abnormality.  Recent workup in the hospital and ER.  No indication for additional imaging or lab studies at this time.  We'll add a very short course of Ativan when necessary nausea for home.  Outpatient primary care and GI follow-up.  I've asked the patient to return to the emergency department for any new or worsening symptoms.  He understands this.    Azalia BilisKevin Juanjose Mojica, MD 12/13/14 [phone]

## 2014-12-13 NOTE — ED Notes (Signed)
Pt has a ride home.  

## 2014-12-13 NOTE — ED Notes (Addendum)
Seen here yesterday for same, given prescriptions but did not get relief due to throwing them up.

## 2014-12-13 NOTE — ED Notes (Signed)
Pt reported n/v without diarrhea and lower abd pain. Abd soft/nondistended/ but tender to palpate. BS (+) x4 quadrants.

## 2014-12-13 NOTE — ED Provider Notes (Deleted)
CSN: [id number]     Arrival date & time 12/13/14  1540 History   First MD Initiated Contact with Patient 12/13/14 1546     Chief Complaint  Patient presents with  . Abdominal Pain  . Nausea  . Emesis     HPI Pt reports need for detox from ETOH today. Last use earlier today. Walked to the ER. No HI or SI. No other complaints. No CP or abdominal pain. No seizures. Denies n/v and abdominal pain.    Past Medical History  Diagnosis Date  . Asthma   . Chronic abdominal pain   . Nausea and vomiting     chronic, recurrent  . Polysubstance abuse   . Gastroparesis   . 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 GASTRITIS 12/09/2009    Qualifier: Diagnosis of  By: Daphine DeutscherMartin FNP, Zena AmosNykedtra     Past Surgical History  Procedure Laterality Date  . Cholecystectomy  11/2009   Family History  Problem Relation Age of Onset  . Other Father   . Diabetes Father   . Hypertension Father    History  Substance Use Topics  . Smoking status: Current Every Day Smoker  . Smokeless tobacco: Never Used  . Alcohol Use: 0.0 oz/week    0 Standard drinks or equivalent per week    Review of Systems  All other systems reviewed and are negative.     Allergies  Review of patient's allergies indicates no known allergies.  Home Medications   Prior to Admission medications   Medication Sig Start Date End Date Taking? Authorizing Provider  chlordiazePOXIDE (LIBRIUM) 25 MG capsule 50mg  PO TID x 1D, then 25-50mg  PO BID X 1D, then 25-50mg  PO QD X 1D 12/13/14   Azalia BilisKevin Synthia Fairbank, MD  hydrocortisone cream 1 % Apply to affected area 2 times daily Patient not taking: Reported on 12/03/2014 05/18/14   Nada Boozerobyn M Hess, PA-C  ibuprofen (ADVIL,MOTRIN) 400 MG tablet Take 1 tablet (400 mg total) by mouth every 6 (six) hours as needed. 12/12/14   Joycie PeekBenjamin Cartner, PA-C  metoCLOPramide (REGLAN)  10 MG tablet Take 1 tablet (10 mg total) by mouth every 6 (six) hours. 12/12/14   Joycie PeekBenjamin Cartner, PA-C  promethazine (PHENERGAN) 25 MG tablet Take 1 tablet (25 mg total) by mouth every 6 (six) hours as needed for nausea or vomiting. Patient not taking: Reported on 12/12/2014 12/08/14   Meredeth IdeGagan S Lama, MD   There were no vitals taken for this visit. Physical Exam  Constitutional: He is oriented to person, place, and time. He appears well-developed and well-nourished.  HENT:  Head: Normocephalic and atraumatic.  Eyes: EOM are normal.  Neck: Normal range of motion.  Cardiovascular: Normal rate, regular rhythm, normal heart sounds and intact distal pulses.   Pulmonary/Chest: Effort normal and breath sounds normal. No respiratory distress.  Abdominal: Soft. He exhibits no distension. There is no tenderness.  Musculoskeletal: Normal range of motion.  Neurological: He is alert and oriented to person, place, and time.  Skin: Skin is warm and dry.  Psychiatric: He has a normal mood and affect. Judgment normal.  Nursing note and vitals reviewed.   ED Course  Procedures (including critical care time) Labs Review Labs Reviewed - No data to display  Imaging Review No results found.   EKG Interpretation None  MDM   Final diagnoses:  Alcohol intoxication, uncomplicated    Ambulated in ER without difficulty. Dc home in good condition. ETOH intoxication. Librium protocol and resources    Azalia Bilis, MD 12/13/14 1556

## 2014-12-14 ENCOUNTER — Encounter (HOSPITAL_COMMUNITY): Payer: Self-pay

## 2014-12-14 ENCOUNTER — Observation Stay (HOSPITAL_COMMUNITY)
Admission: EM | Admit: 2014-12-14 | Discharge: 2014-12-16 | Disposition: A | Discharge status: Home / Self Care | Payer: Self-pay | Attending: Internal Medicine | Admitting: Internal Medicine

## 2014-12-14 DIAGNOSIS — R1115 Cyclical vomiting syndrome unrelated to migraine: Secondary | ICD-10-CM | POA: Diagnosis present

## 2014-12-14 DIAGNOSIS — J45909 Unspecified asthma, uncomplicated: Secondary | ICD-10-CM | POA: Insufficient documentation

## 2014-12-14 DIAGNOSIS — F1910 Other psychoactive substance abuse, uncomplicated: Secondary | ICD-10-CM | POA: Insufficient documentation

## 2014-12-14 DIAGNOSIS — R109 Unspecified abdominal pain: Secondary | ICD-10-CM | POA: Diagnosis present

## 2014-12-14 DIAGNOSIS — E876 Hypokalemia: Secondary | ICD-10-CM | POA: Insufficient documentation

## 2014-12-14 DIAGNOSIS — K3184 Gastroparesis: Principal | ICD-10-CM | POA: Diagnosis present

## 2014-12-14 DIAGNOSIS — R1084 Generalized abdominal pain: Secondary | ICD-10-CM

## 2014-12-14 DIAGNOSIS — F1220 Cannabis dependence, uncomplicated: Secondary | ICD-10-CM | POA: Diagnosis present

## 2014-12-14 DIAGNOSIS — F17210 Nicotine dependence, cigarettes, uncomplicated: Secondary | ICD-10-CM | POA: Insufficient documentation

## 2014-12-14 DIAGNOSIS — R1110 Vomiting, unspecified: Secondary | ICD-10-CM

## 2014-12-14 DIAGNOSIS — F1010 Alcohol abuse, uncomplicated: Secondary | ICD-10-CM | POA: Diagnosis present

## 2014-12-14 DIAGNOSIS — Z9049 Acquired absence of other specified parts of digestive tract: Secondary | ICD-10-CM | POA: Insufficient documentation

## 2014-12-14 DIAGNOSIS — K2970 Gastritis, unspecified, without bleeding: Secondary | ICD-10-CM | POA: Insufficient documentation

## 2014-12-14 DIAGNOSIS — R17 Unspecified jaundice: Secondary | ICD-10-CM | POA: Insufficient documentation

## 2014-12-14 DIAGNOSIS — R112 Nausea with vomiting, unspecified: Secondary | ICD-10-CM | POA: Diagnosis present

## 2014-12-14 LAB — URINALYSIS, ROUTINE W REFLEX MICROSCOPIC
Bilirubin Urine: NEGATIVE
Glucose, UA: NEGATIVE mg/dL
Hgb urine dipstick: NEGATIVE
Ketones, ur: 15 mg/dL — AB
Leukocytes, UA: NEGATIVE
Nitrite: NEGATIVE
Protein, ur: NEGATIVE mg/dL
Specific Gravity, Urine: 1.017 (ref 1.005–1.030)
Urobilinogen, UA: 1 mg/dL (ref 0.0–1.0)
pH: 6 (ref 5.0–8.0)

## 2014-12-14 LAB — CBC WITH DIFFERENTIAL/PLATELET
Basophils Absolute: 0 10*3/uL (ref 0.0–0.1)
Basophils Relative: 0 % (ref 0–1)
Eosinophils Absolute: 0 10*3/uL (ref 0.0–0.7)
Eosinophils Relative: 0 % (ref 0–5)
HCT: 39.7 % (ref 39.0–52.0)
Hemoglobin: 13.6 g/dL (ref 13.0–17.0)
Lymphocytes Relative: 12 % (ref 12–46)
Lymphs Abs: 1.4 10*3/uL (ref 0.7–4.0)
MCH: 31.9 pg (ref 26.0–34.0)
MCHC: 34.3 g/dL (ref 30.0–36.0)
MCV: 93.2 fL (ref 78.0–100.0)
Monocytes Absolute: 0.6 10*3/uL (ref 0.1–1.0)
Monocytes Relative: 5 % (ref 3–12)
Neutro Abs: 9.5 10*3/uL — ABNORMAL HIGH (ref 1.7–7.7)
Neutrophils Relative %: 83 % — ABNORMAL HIGH (ref 43–77)
Platelets: 287 10*3/uL (ref 150–400)
RBC: 4.26 MIL/uL (ref 4.22–5.81)
RDW: 11.9 % (ref 11.5–15.5)
WBC: 11.5 10*3/uL — ABNORMAL HIGH (ref 4.0–10.5)

## 2014-12-14 LAB — COMPREHENSIVE METABOLIC PANEL
ALT: 24 U/L (ref 17–63)
AST: 31 U/L (ref 15–41)
Albumin: 4.1 g/dL (ref 3.5–5.0)
Alkaline Phosphatase: 60 U/L (ref 38–126)
Anion gap: 12 (ref 5–15)
BUN: 11 mg/dL (ref 6–20)
CO2: 27 mmol/L (ref 22–32)
Calcium: 9.6 mg/dL (ref 8.9–10.3)
Chloride: 97 mmol/L — ABNORMAL LOW (ref 101–111)
Creatinine, Ser: 1.08 mg/dL (ref 0.61–1.24)
GFR calc Af Amer: >60 mL/min (ref >60)
GFR calc non Af Amer: >60 mL/min (ref >60)
Glucose, Bld: 121 mg/dL — ABNORMAL HIGH (ref 70–99)
Potassium: 3.6 mmol/L (ref 3.5–5.1)
Sodium: 136 mmol/L (ref 135–145)
Total Bilirubin: 3.4 mg/dL — ABNORMAL HIGH (ref 0.3–1.2)
Total Protein: 8.2 g/dL — ABNORMAL HIGH (ref 6.5–8.1)

## 2014-12-14 LAB — LIPASE, BLOOD: Lipase: 20 U/L — ABNORMAL LOW (ref 22–51)

## 2014-12-14 LAB — RAPID URINE DRUG SCREEN, HOSP PERFORMED
Amphetamines: NOT DETECTED
Barbiturates: NOT DETECTED
Benzodiazepines: POSITIVE — AB
Cocaine: NOT DETECTED
Opiates: NOT DETECTED
Tetrahydrocannabinol: POSITIVE — AB

## 2014-12-14 LAB — ETHANOL: Alcohol, Ethyl (B): <5 mg/dL (ref <5)

## 2014-12-14 LAB — TSH: TSH: 1.031 u[IU]/mL (ref 0.350–4.500)

## 2014-12-14 MED ORDER — METOCLOPRAMIDE HCL 5 MG/ML IJ SOLN
10.0000 mg | Freq: Once | INTRAMUSCULAR | Status: AC
  Administered 2014-12-14: 10 mg via INTRAVENOUS
  Filled 2014-12-14: qty 2

## 2014-12-14 MED ORDER — PROMETHAZINE HCL 25 MG/ML IJ SOLN
25.0000 mg | Freq: Once | INTRAMUSCULAR | Status: AC
  Administered 2014-12-14: 25 mg via INTRAVENOUS
  Filled 2014-12-14: qty 1

## 2014-12-14 MED ORDER — ONDANSETRON HCL 4 MG PO TABS: 4.0000 mg | ORAL_TABLET | Freq: Four times a day (QID) | ORAL | Status: DC | PRN

## 2014-12-14 MED ORDER — DEXTROSE-NACL 5-0.9 % IV SOLN
INTRAVENOUS | Status: DC
  Administered 2014-12-14 – 2014-12-16 (×4): via INTRAVENOUS

## 2014-12-14 MED ORDER — HYDROMORPHONE HCL 1 MG/ML IJ SOLN
0.5000 mg | Freq: Once | INTRAMUSCULAR | Status: AC
  Administered 2014-12-14: 0.5 mg via INTRAVENOUS
  Filled 2014-12-14: qty 1

## 2014-12-14 MED ORDER — ONDANSETRON HCL 4 MG/2ML IJ SOLN
4.0000 mg | Freq: Four times a day (QID) | INTRAMUSCULAR | Status: DC | PRN
  Administered 2014-12-15: 4 mg via INTRAVENOUS
  Filled 2014-12-14: qty 2

## 2014-12-14 MED ORDER — SODIUM CHLORIDE 0.9 % IV BOLUS (SEPSIS)
1000.0000 mL | Freq: Once | INTRAVENOUS | Status: AC
Start: 2014-12-14 — End: 2014-12-14
  Administered 2014-12-14: 1000 mL via INTRAVENOUS

## 2014-12-14 MED ORDER — METOCLOPRAMIDE HCL 5 MG/ML IJ SOLN
5.0000 mg | Freq: Four times a day (QID) | INTRAMUSCULAR | Status: DC
  Administered 2014-12-14 – 2014-12-16 (×7): 5 mg via INTRAVENOUS
  Filled 2014-12-14 (×7): qty 2

## 2014-12-14 MED ORDER — ENOXAPARIN SODIUM 40 MG/0.4ML ~~LOC~~ SOLN: 40.0000 mg | SUBCUTANEOUS | Status: DC

## 2014-12-14 MED ORDER — SODIUM CHLORIDE 0.9 % IV SOLN
INTRAVENOUS | Status: DC
  Administered 2014-12-14: 15:00:00 via INTRAVENOUS

## 2014-12-14 MED ORDER — SODIUM CHLORIDE 0.9 % IV BOLUS (SEPSIS)
1000.0000 mL | Freq: Once | INTRAVENOUS | Status: AC
  Administered 2014-12-14: 1000 mL via INTRAVENOUS

## 2014-12-14 NOTE — ED Notes (Signed)
Pt here for vomiting and abd pain for the past week. Unable to keep liquids down. Pt is having lower abd pain. No diarrhea. [facility]

## 2014-12-14 NOTE — ED Notes (Signed)
Attempted to call report to floor at this time.  Floor was not able to take report at this time, will call back.

## 2014-12-14 NOTE — ED Provider Notes (Signed)
CSN: [id number]     Arrival date & time 12/14/14  1136 History   First MD Initiated Contact with Patient 12/14/14 1153     Chief Complaint  Patient presents with  . Abdominal Pain     (Consider location/radiation/quality/duration/timing/severity/associated sxs/prior Treatment) HPI  PCP: No PCP Per Patient Blood pressure 180/92, pulse 58, temperature 98.1 F (36.7 C), temperature source Oral, resp. rate 20, height 5\' 9"  (1.753 m), weight 185 lb (83.915 kg), SpO2 100 %.  Matthew Scott is a 34 y.o.male with a significant PMH of asthma, chronic abdominal pain, nausea and vomiting, polysubstance abuse, gastroparesis presents to the ER with complaints of cyclical vomiting and exacerbation of chronic abdominal pains. This is his 3rd visit in three days for the same complaint. He has tried Phenergan and NSAIDs but says they have not helped and that he has not been able to keep water down and has not tried to eat. He denies having cough, CP, diarrhea, SOB. He has been burping and having the hiccups. Patient has been admitted for this before. He describes having belly pain since before his gallbladder was taken out and that now it hurts on and off all the time - pain unchanged over the past week from his chronic pain.   Past Medical History  Diagnosis Date  . Asthma   . Chronic abdominal pain   . Nausea and vomiting     chronic, recurrent  . Polysubstance abuse   . Gastroparesis   . 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 GASTRITIS 12/09/2009    Qualifier: Diagnosis of  By: Daphine DeutscherMartin FNP, Zena AmosNykedtra     Past Surgical History  Procedure Laterality Date  . Cholecystectomy  11/2009   Family History  Problem Relation Age of Onset  . Other Father   . Diabetes Father   . Hypertension Father    History  Substance Use Topics  . Smoking status: Current Every  Day Smoker  . Smokeless tobacco: Never Used  . Alcohol Use: 0.0 oz/week    0 Standard drinks or equivalent per week    Review of Systems  10 Systems reviewed and are negative for acute change except as noted in the HPI.    Allergies  Review of patient's allergies indicates no known allergies.  Home Medications   Prior to Admission medications   Medication Sig Start Date End Date Taking? Authorizing Provider  metoCLOPramide (REGLAN) 10 MG tablet Take 1 tablet (10 mg total) by mouth every 6 (six) hours. 12/12/14  Yes Joycie PeekBenjamin Cartner, PA-C  promethazine (PHENERGAN) 25 MG tablet Take 1 tablet (25 mg total) by mouth every 6 (six) hours as needed for nausea or vomiting. 12/08/14  Yes Meredeth IdeGagan S Lama, MD  hydrocortisone cream 1 % Apply to affected area 2 times daily Patient not taking: Reported on 12/03/2014 05/18/14   Nada Boozerobyn M Hess, PA-C  ibuprofen (ADVIL,MOTRIN) 400 MG tablet Take 1 tablet (400 mg total) by mouth every 6 (six) hours as needed. 12/12/14   Joycie PeekBenjamin Cartner, PA-C  LORazepam (ATIVAN) 1 MG tablet Take 1 tablet (1 mg total) by mouth every 8 (eight) hours as needed (nausea). 12/13/14   Azalia BilisKevin Campos, MD   BP 142/93 mmHg  Pulse 67  Temp(Src) 98.1 F (36.7 C) (Oral)  Resp 20  Ht 5\' 9"  (1.753 m)  Wt 185  lb (83.915 kg)  BMI 27.31 kg/m2  SpO2 100% Physical Exam  Constitutional: He appears well-developed and well-nourished. No distress.  HENT:  Head: Normocephalic and atraumatic.  Eyes: Pupils are equal, round, and reactive to light.  Neck: Normal range of motion. Neck supple.  Cardiovascular: Normal rate and regular rhythm.   Pulmonary/Chest: Effort normal.  Abdominal: Soft. Bowel sounds are normal. He exhibits no distension and no fluid wave. There is tenderness (mild diffuse generalized abdominal pain). There is no rigidity, no rebound, no guarding and no CVA tenderness.  Pt vomits up stomach acid during exam. Hiccups.  Neurological: He is alert.  Skin: Skin is warm and dry.   Nursing note and vitals reviewed.   ED Course  Procedures (including critical care time) Labs Review Labs Reviewed  CBC WITH DIFFERENTIAL/PLATELET - Abnormal; Notable for the following:    WBC 11.5 (*)    Neutrophils Relative % 83 (*)    Neutro Abs 9.5 (*)    All other components within normal limits  COMPREHENSIVE METABOLIC PANEL - Abnormal; Notable for the following:    Chloride 97 (*)    Glucose, Bld 121 (*)    Total Protein 8.2 (*)    Total Bilirubin 3.4 (*)    All other components within normal limits  LIPASE, BLOOD - Abnormal; Notable for the following:    Lipase 20 (*)    All other components within normal limits  URINALYSIS, ROUTINE W REFLEX MICROSCOPIC  URINE RAPID DRUG SCREEN (HOSP PERFORMED)  ETHANOL    Imaging Review No results found.   EKG Interpretation None      MDM   Final diagnoses:  Intractable cyclical vomiting with nausea    Medications  0.9 %  sodium chloride infusion (not administered)  sodium chloride 0.9 % bolus 1,000 mL (0 mLs Intravenous Stopped 12/14/14 1415)  metoCLOPramide (REGLAN) injection 10 mg (10 mg Intravenous Given 12/14/14 1242)  sodium chloride 0.9 % bolus 1,000 mL (0 mLs Intravenous Stopped 12/14/14 1336)  promethazine (PHENERGAN) injection 25 mg (25 mg Intravenous Given 12/14/14 1246)    Patient has newly elevated WBC at 11.5, chloride mildly low at 97 and bilirubin has doubled since yesterday and is now 3.4. - pt has hx of cholecystectomy. He describes pain being unchanged from pain over the past week.  The patient received medication in the ER for nausea. This is his 3rd visit in 3 days and he was discharged from the same thing this past month. He endorses improvement of nausea but still dry heaves in the exam room. Only stomach contents. He denies smoking marijuana since leaving the ED and denies drinking ETOH.  Patient admitted to [facility], [address], [place], [place].  Filed Vitals:   12/14/14 1415  BP: 142/93  Pulse:  67  Temp:   Resp:       [name], PA-C 12/14/14 1428  [name], MD 12/14/14 [phone]

## 2014-12-14 NOTE — H&P (Signed)
History and Physical  Matthew Scott QIO:[id number] DOB: 13-Oct-1980 DOA: 12/14/2014  Referring physician: [name], ER PA PCP: No PCP Per Patient   Chief Complaint: Nausea and vomiting  HPI: Matthew Scott is a 34 y.o. male  Past history of alcohol use, THC abuse and repeated admissions for nausea and vomiting felt to be secondary to gastroparesis (positive study in 2012) versus [place] versus THC induced cyclic vomiting who was just discharged from the hospitalist service on 4/25 for the same. He has since been to the emergency room twice since then and it was documented that he admitted to drinking and smoking pot after he was last discharged. Patient came back in today with more complaints of the same of unable to keep anything down and he looked somewhat dehydrated. Basic labs were done which were unrevealing, however was noted that his bilirubin was elevated at 3.6, twice is much as what it usually is. Hospitalists were called for further evaluation and admission. No imaging done as imaging had been just recently done last hospitalization   Review of Systems:  Patient seen in the emergency room Pt complains of nausea, vomiting, chills, generalized nonspecific abdominal pain. He states he's not had a bowel movement in quite some time  Pt denies any headaches, vision changes, dysphagia, chest pain, palpitations, shortness of breath, wheeze, cough, hematuria, dysuria, diarrhea, focal extremity numbness weakness or pain.  Review of systems are otherwise negative  Past Medical History  Diagnosis Date  . Asthma   . Chronic abdominal pain   . Nausea and vomiting     chronic, recurrent  . Polysubstance abuse   . Gastroparesis   . 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 GASTRITIS 12/09/2009    Qualifier: Diagnosis of  By: Daphine Deutscher FNP, Zena Amos      Past Surgical History  Procedure Laterality Date  . Cholecystectomy  11/2009   Social History:  reports that he has been smoking.  He has never used smokeless tobacco. He reports that he drinks alcohol. He reports that he uses illicit drugs (Marijuana). Patient lives at home by himself & is able to participate in activities of daily living with out assistance  No Known Allergies  Family History  Problem Relation Age of Onset  . Other Father   . Diabetes Father   . Hypertension Father      Prior to Admission medications   Medication Sig Start Date End Date Taking? Authorizing Provider  metoCLOPramide (REGLAN) 10 MG tablet Take 1 tablet (10 mg total) by mouth every 6 (six) hours. 12/12/14  Yes Joycie Peek, PA-C  promethazine (PHENERGAN) 25 MG tablet Take 1 tablet (25 mg total) by mouth every 6 (six) hours as needed for nausea or vomiting. 12/08/14  Yes Meredeth Ide, MD  hydrocortisone cream 1 % Apply to affected area 2 times daily Patient not taking: Reported on 12/03/2014 05/18/14   Nada Boozer Hess, PA-C  ibuprofen (ADVIL,MOTRIN) 400 MG tablet Take 1 tablet (400 mg total) by mouth every 6 (six) hours as needed. 12/12/14   Joycie Peek, PA-C  LORazepam (ATIVAN) 1 MG tablet Take 1 tablet (1 mg total) by mouth every 8 (eight) hours as needed (nausea). 12/13/14   Azalia Bilis, MD    Physical Exam: BP 157/80 mmHg  Pulse 57  Temp(Src) 98.8 F (37.1  C) (Oral)  Resp 20  Ht 5\' 9"  (1.753 m)  Wt 78.926 kg (174 lb)  BMI 25.68 kg/m2  SpO2 100%  General:  Alert and oriented 3, mild distress from her current drinking Eyes: Sclera nonicteric, extraocular movements are intact ENT: Normocephalic, old scars over face, mucous memory and slightly dry Neck: No JVD Cardiovascular: Regular rate and rhythm, S1-S2 Respiratory: Clear to auscultation bilaterally Abdomen: Soft, nontender, nondistended, positive bowel sounds Skin: No skin breaks, tears or lesions Musculoskeletal: No clubbing or  cyanosis or edema Psychiatric: Patient appropriate, no evidence of psychoses Neurologic: No focal deficits           Labs on Admission:  Basic Metabolic Panel:  Recent Labs Lab 12/12/14 0819 12/14/14 1155  NA 140 136  K 3.0* 3.6  CL 107 97*  CO2 26 27  GLUCOSE 129* 121*  BUN 13 11  CREATININE 0.89 1.08  CALCIUM 9.0 9.6   Liver Function Tests:  Recent Labs Lab 12/12/14 0819 12/14/14 1155  AST 25 31  ALT 24 24  ALKPHOS 59 60  BILITOT 1.6* 3.4*  PROT 6.9 8.2*  ALBUMIN 4.2 4.1    Recent Labs Lab 12/14/14 1155  LIPASE 20*   No results for input(s): AMMONIA in the last 168 hours. CBC:  Recent Labs Lab 12/12/14 0819 12/14/14 1155  WBC 7.7 11.5*  NEUTROABS 5.7 9.5*  HGB 12.7* 13.6  HCT 37.1* 39.7  MCV 94.2 93.2  PLT 231 287   Cardiac Enzymes: No results for input(s): CKTOTAL, CKMB, CKMBINDEX, TROPONINI in the last 168 hours.  BNP (last 3 results) No results for input(s): BNP in the last 8760 hours.  ProBNP (last 3 results) No results for input(s): PROBNP in the last 8760 hours.  CBG: No results for input(s): GLUCAP in the last 168 hours.  Radiological Exams on Admission: No results found.  EKG: Independently reviewed. Not done  Assessment/Plan Present on Admission:  . Cyclical vomiting . intractable Nausea & vomiting in patient with abdominal pain and history of gastroparesis, THC and alcohol abuse: Started IV fluids and treating with IV Zofran. No narcotics. No IV Phenergan. Patient was just discharged on 4/25 and advised that [facility] could contribute to this. ER on 4/29 clearly documents that patient on 4/28 was drinking alcohol and smoking pot again. Clear liquid diet and advance as tolerated. We'll check TSH and A1c for thoroughness. Patient has not had a gastric empty study is normal for years.   Elevated bilirubin level: Unclear etiology. No other signs of obstruction. This is quite elevated and previous hospitalizations have all been normal.  This could be from possible acute drinking, so we'll plan for hydration and rechecking labs tomorrow. If bilirubin still elevated, check CT scan of abdomen and pelvis   Patient without PCP follow-up: Needs referral to [facility]. Will put in case manager consult   No bowel movement lately: Given imaging recently done and patient was cyclical nausea and vomiting, felt no reason to check x-ray or CT    Consultants: None  Code Status: Full code  Family Communication: No family present   Disposition Plan: Hopefully home tomorrow  Time spent: 40 minutes  Hollice Espy [facility] Pager [phone]

## 2014-12-15 DIAGNOSIS — G43A0 Cyclical vomiting, not intractable: Secondary | ICD-10-CM

## 2014-12-15 DIAGNOSIS — R17 Unspecified jaundice: Secondary | ICD-10-CM

## 2014-12-15 LAB — COMPREHENSIVE METABOLIC PANEL
ALT: 22 U/L (ref 17–63)
AST: 21 U/L (ref 15–41)
Albumin: 4.1 g/dL (ref 3.5–5.0)
Alkaline Phosphatase: 61 U/L (ref 38–126)
Anion gap: 10 (ref 5–15)
BUN: 7 mg/dL (ref 6–20)
CO2: 28 mmol/L (ref 22–32)
Calcium: 9.6 mg/dL (ref 8.9–10.3)
Chloride: 102 mmol/L (ref 101–111)
Creatinine, Ser: 1.06 mg/dL (ref 0.61–1.24)
GFR calc Af Amer: >60 mL/min (ref >60)
GFR calc non Af Amer: >60 mL/min (ref >60)
Glucose, Bld: 116 mg/dL — ABNORMAL HIGH (ref 70–99)
Potassium: 3.1 mmol/L — ABNORMAL LOW (ref 3.5–5.1)
Sodium: 140 mmol/L (ref 135–145)
Total Bilirubin: 3.4 mg/dL — ABNORMAL HIGH (ref 0.3–1.2)
Total Protein: 7.7 g/dL (ref 6.5–8.1)

## 2014-12-15 LAB — CBC
HCT: 40.3 % (ref 39.0–52.0)
Hemoglobin: 13.7 g/dL (ref 13.0–17.0)
MCH: 31.9 pg (ref 26.0–34.0)
MCHC: 34 g/dL (ref 30.0–36.0)
MCV: 93.7 fL (ref 78.0–100.0)
Platelets: 299 10*3/uL (ref 150–400)
RBC: 4.3 MIL/uL (ref 4.22–5.81)
RDW: 11.9 % (ref 11.5–15.5)
WBC: 10.9 10*3/uL — ABNORMAL HIGH (ref 4.0–10.5)

## 2014-12-15 LAB — MAGNESIUM: Magnesium: 1.9 mg/dL (ref 1.7–2.4)

## 2014-12-15 LAB — HEMOGLOBIN A1C
Hgb A1c MFr Bld: 5.1 % (ref 4.8–5.6)
Mean Plasma Glucose: 100 mg/dL

## 2014-12-15 MED ORDER — KETOROLAC TROMETHAMINE 15 MG/ML IJ SOLN
15.0000 mg | Freq: Four times a day (QID) | INTRAMUSCULAR | Status: DC | PRN
  Administered 2014-12-15 (×2): 15 mg via INTRAVENOUS
  Filled 2014-12-15 (×2): qty 1

## 2014-12-15 MED ORDER — HEPARIN SODIUM (PORCINE) 5000 UNIT/ML IJ SOLN
5000.0000 [IU] | Freq: Three times a day (TID) | INTRAMUSCULAR | Status: DC
  Administered 2014-12-15 – 2014-12-16 (×3): 5000 [IU] via SUBCUTANEOUS
  Filled 2014-12-15 (×2): qty 1

## 2014-12-15 MED ORDER — POTASSIUM CHLORIDE CRYS ER 20 MEQ PO TBCR
40.0000 meq | EXTENDED_RELEASE_TABLET | Freq: Two times a day (BID) | ORAL | Status: AC
  Administered 2014-12-15 (×2): 40 meq via ORAL
  Filled 2014-12-15 (×3): qty 2

## 2014-12-15 MED ORDER — IOHEXOL 300 MG/ML  SOLN
25.0000 mL | INTRAMUSCULAR | Status: AC
  Administered 2014-12-15 (×2): 25 mL via ORAL

## 2014-12-15 NOTE — Discharge Summary (Signed)
Physician Discharge Summary  Matthew Scott ZOX:[id number] DOB: 05-20-1981 DOA: 12/14/2014  PCP: No PCP Per Patient  Admit date: 12/14/2014 Discharge date: 12/16/2014  Time spent: 30 minutes  Recommendations for Outpatient Follow-up:  1. Follow up with PCP on Friday and get BMP and liver panel checked   Discharge Diagnoses:  Active Problems:   Abdominal pain   Gastroparesis   Cyclical vomiting   Nausea & vomiting   Severe tetrahydrocannabinol (THC) dependence   Alcohol abuse   Discharge Condition: improved  Diet recommendation: regular  Filed Weights   12/14/14 1146 12/14/14 1648  Weight: 83.915 kg (185 lb) 78.926 kg (174 lb)    History of present illness:  Matthew Scott is a 34 y.o. Male with Past history of alcohol use, THC abuse and repeated admissions for nausea and vomiting felt to be secondary to gastroparesis (positive study in 2012) versus THC induced cyclic vomiting , just discharged presented on 5/1 for nausea and vomiting. He waas admitted for symptomatic management.   Hospital Course:  Cyclical vomiting associated with nausea: probably from using THC and alcohol abuse.  He was started on clear liquid diet, he was adamant this am, that he wanted regular diet which we advanced. He was able to tolerate without any nausea, vomiting or abdominal pain.   Spoke to him about his bilirubin to be high and he will need further follow up . FOLLOWED up with a CT abdomen and pelvis, did not show any acute abnormality. He plan to follow up with community health clinic and get labs checked to check his bilirubin and potassium on friday.    Constipation : resolved.    Hypokalemia: replete as needed.    Procedures:  none  Consultations:  none  Discharge Exam: Filed Vitals:   12/16/14 1423  BP: 142/73  Pulse: 77  Temp: 99.3 F (37.4 C)  Resp: 16    General: alert afebrile comfortable Cardiovascular: s1s2 Respiratory: ctab  Discharge  Instructions   Discharge Instructions    Diet general    Complete by:  As directed      Discharge instructions    Complete by:  As directed   Follow upw ith PCP in 2 days and get comprehensive metabolic panel to check your potassium and your bilirubin level.     Discharge instructions    Complete by:  As directed   Follow up with PCP on Friday and get BMP.          Current Discharge Medication List    CONTINUE these medications which have NOT CHANGED   Details  metoCLOPramide (REGLAN) 10 MG tablet Take 1 tablet (10 mg total) by mouth every 6 (six) hours. Qty: 30 tablet, Refills: 0    promethazine (PHENERGAN) 25 MG tablet Take 1 tablet (25 mg total) by mouth every 6 (six) hours as needed for nausea or vomiting. Qty: 15 tablet, Refills: 0    ibuprofen (ADVIL,MOTRIN) 400 MG tablet Take 1 tablet (400 mg total) by mouth every 6 (six) hours as needed. Qty: 30 tablet, Refills: 0    LORazepam (ATIVAN) 1 MG tablet Take 1 tablet (1 mg total) by mouth every 8 (eight) hours as needed (nausea). Qty: 12 tablet, Refills: 0      STOP taking these medications     hydrocortisone cream 1 %        No Known Allergies Follow-up Information    Follow up with [facility]    .  Why:  Doctor appointment Friday Dec 19, 2014 at Yahoo2pm    Contact information:   [address] [place] [id number] [phone]      Follow up with [facility]    .   Why:  Financial assistance Friday Jan 09, 2015 at 330 pm    Contact information:   [address] [place] [id number] [phone]       The results of significant diagnostics from this hospitalization (including imaging, microbiology, ancillary and laboratory) are listed below for reference.    Significant Diagnostic Studies: Dg Chest 2 View  12/05/2014   CLINICAL DATA:  34 year old male with a history of vomiting and abdominal pain.  EXAM:  CHEST - 2 VIEW  COMPARISON:  08/09/2012  FINDINGS: Cardiomediastinal silhouette projects within normal limits in size and contour. No confluent airspace disease, pneumothorax, or pleural effusion.  No displaced fracture.  Unremarkable appearance of the upper abdomen.  IMPRESSION: No radiographic evidence of acute cardiopulmonary disease.  Signed,  Yvone NeuJaime S. Loreta AveWagner, DO  [facility]  [facility]   Electronically Signed   By: Gilmer MorJaime  Wagner D.O.   On: 12/05/2014 13:18   Ct Abdomen Pelvis W Contrast  12/16/2014   CLINICAL DATA:  Elevated bili Rubin. Nausea and vomiting, intermittent for months.  EXAM: CT ABDOMEN AND PELVIS WITH CONTRAST  TECHNIQUE: Multidetector CT imaging of the abdomen and pelvis was performed using the standard protocol following bolus administration of intravenous contrast.  CONTRAST:  100mL OMNIPAQUE IOHEXOL 300 MG/ML  SOLN  COMPARISON:  12/05/2014  FINDINGS: There is cholecystectomy. There are normal appearances of the liver and bile ducts. There are unremarkable appearances of the gallbladder fossa.  The spleen, pancreas, adrenals and kidneys appear normal except for a 3 mm upper pole right collecting system calculus. There is no hydronephrosis. There is no ureteral dilatation.  Bowel is unremarkable.  The abdominal aorta is normal in caliber without atherosclerotic calcification.  There is no significant abnormality in the lower chest.  There is no significant musculoskeletal abnormality.  IMPRESSION: *Unremarkable post cholecystectomy appearances of the bile ducts and gallbladder fossa. *Nonobstructing upper pole right collecting system calculus measuring 3 mm.   Electronically Signed   By: Ellery Plunkaniel R Mitchell M.D.   On: 12/16/2014 02:31   Ct Abdomen Pelvis W Contrast  12/05/2014   CLINICAL DATA:  34 year old male with a history of abdominal pain and vomiting  EXAM: CT ABDOMEN AND PELVIS WITH CONTRAST  TECHNIQUE: Multidetector CT imaging of the  abdomen and pelvis was performed using the standard protocol following bolus administration of intravenous contrast.  CONTRAST:  100mL OMNIPAQUE IOHEXOL 300 MG/ML  SOLN  COMPARISON:  05/29/2014  FINDINGS: Lower chest:  Left-sided gynecomastia.  Heart size within normal limits.  No pericardial fluid/thickening.  No lower mediastinal adenopathy.  Unremarkable appearance of the distal esophagus.  No hiatal hernia.  No confluent airspace disease, pleural fluid, or pneumothorax within visualized lung. New  Abdomen/pelvis:  Unremarkable appearance of liver, spleen, bilateral adrenal glands.  Unremarkable appearance of the pancreas.  Surgical changes of cholecystectomy.  No abnormally distended small bowel or colon.  Normal appendix.  No significant inflammatory changes of the mesenteric. No free intraperitoneal air or significant free fluid.  No mesenteric adenopathy.  No retroperitoneal adenopathy.  Punctate calcification in the upper pole collecting system of the right kidney again noted. No evidence of right-sided hydronephrosis. Unremarkable appearance of the course of the right ureter.  No evidence of left-sided hydronephrosis or nephrolithiasis. Unremarkable appearance of the course of the left ureter.  Unremarkable appearance of the urinary bladder.  Vascular: Unremarkable course caliber and contour of the abdominal aorta and visualized mesenteric vasculature.  Musculoskeletal:  No displaced fracture.  No significant degenerative changes.  IMPRESSION: No acute finding of the abdomen/pelvis CT to account for the patient's symptoms. Normal appendix.  Incidental findings as above.  Signed,  Yvone Neu. Loreta Ave, DO  [facility]  [facility]   Electronically Signed   By: Gilmer Mor D.O.   On: 12/05/2014 13:59    Microbiology: No results found for this or any previous visit (from the past 240 hour(s)).   Labs: Basic Metabolic Panel:  Recent Labs Lab 12/12/14 0819  12/14/14 1155 12/15/14 0459 12/15/14 1932 12/16/14 0410 12/16/14 1530  NA 140 136 140  --  138  --   K 3.0* 3.6 3.1*  --  3.0* 3.4*  CL 107 97* 102  --  102  --   CO2 --  28  --   GLUCOSE 129* 121* 116*  --  99  --   BUN --  6  --   CREATININE 0.89 1.08 1.06  --  0.90  --   CALCIUM 9.0 9.6 9.6  --  9.0  --   MG  --   --   --  1.9  --   --    Liver Function Tests:  Recent Labs Lab 12/12/14 0819 12/14/14 1155 12/15/14 0459  AST ALT ALKPHOS 59 60 61  BILITOT 1.6* 3.4* 3.4*  PROT 6.9 8.2* 7.7  ALBUMIN 4.2 4.1 4.1    Recent Labs Lab 12/14/14 1155  LIPASE 20*   No results for input(s): AMMONIA in the last 168 hours. CBC:  Recent Labs Lab 12/12/14 0819 12/14/14 1155 12/15/14 0459  WBC 7.7 11.5* 10.9*  NEUTROABS 5.7 9.5*  --   HGB 12.7* 13.6 13.7  HCT 37.1* 39.7 40.3  MCV 94.2 93.2 93.7  PLT 231 287 299   Cardiac Enzymes: No results for input(s): CKTOTAL, CKMB, CKMBINDEX, TROPONINI in the last 168 hours. BNP: BNP (last 3 results) No results for input(s): BNP in the last 8760 hours.  ProBNP (last 3 results) No results for input(s): PROBNP in the last 8760 hours.  CBG: No results for input(s): GLUCAP in the last 168 hours.     SignedKathlen Mody  [facility] 12/16/2014, 4:29 PM

## 2014-12-15 NOTE — Progress Notes (Signed)
[facility] PROGRESS NOTE  Matthew Scott WUJ:[id number] DOB: 1981-04-12 DOA: 12/14/2014 PCP: No PCP Per Patient  Assessment/Plan: Cyclical vomiting associated with nausea: probably from using THC and alcohol abuse.  He was started on clear liquid diet, he was adamant this am, that he wanted regular diet which we advanced. He was able to tolerate without any nausea, vomiting or abdominal pain.  Spoke to him about his bilirubin to be high and he will need further follow up .CT abdomen and pelvis ordered and pending.    Hypokalemia : Replete as needed.   Substance abuse: counseled against use.   Alcohol abuse: No signs of withdrawal.    Constipation: resolved.    Code Status: fullc ode Family Communication: none at bedside.  Disposition Plan: possible in am.   Consultants:  none  Procedures:  CT abd and pelvis  Antibiotics:  none  HPI/Subjective: No nausea or vomiting, requesting solid food.   Objective: Filed Vitals:   12/15/14 1628  BP: 136/71  Pulse: 59  Temp: 98.5 F (36.9 C)  Resp: 18    Intake/Output Summary (Last 24 hours) at 12/15/14 1727 Last data filed at 12/15/14 0700  Gross per 24 hour  Intake    771 ml  Output      3 ml  Net    768 ml   Filed Weights   12/14/14 1146 12/14/14 1648  Weight: 83.915 kg (185 lb) 78.926 kg (174 lb)    Exam:   General:  Alert afebrile comfortable  Cardiovascular: s1s2  Respiratory: ctab  Abdomen: soft non tender non distended bowel sounds heard  Musculoskeletal: no pedal edema.   Data Reviewed: Basic Metabolic Panel:  Recent Labs Lab 12/12/14 0819 12/14/14 1155 12/15/14 0459  NA 140 136 140  K 3.0* 3.6 3.1*  CL 107 97* 102  CO2 26 27 28   GLUCOSE 129* 121* 116*  BUN 13 11 7   CREATININE 0.89 1.08 1.06  CALCIUM 9.0 9.6 9.6   Liver Function Tests:  Recent Labs Lab 12/12/14 0819 12/14/14 1155 12/15/14 0459  AST 25 31 21   ALT 24 24 22   ALKPHOS 59 60 61  BILITOT 1.6* 3.4*  3.4*  PROT 6.9 8.2* 7.7  ALBUMIN 4.2 4.1 4.1    Recent Labs Lab 12/14/14 1155  LIPASE 20*   No results for input(s): AMMONIA in the last 168 hours. CBC:  Recent Labs Lab 12/12/14 0819 12/14/14 1155 12/15/14 0459  WBC 7.7 11.5* 10.9*  NEUTROABS 5.7 9.5*  --   HGB 12.7* 13.6 13.7  HCT 37.1* 39.7 40.3  MCV 94.2 93.2 93.7  PLT 231 287 299   Cardiac Enzymes: No results for input(s): CKTOTAL, CKMB, CKMBINDEX, TROPONINI in the last 168 hours. BNP (last 3 results) No results for input(s): BNP in the last 8760 hours.  ProBNP (last 3 results) No results for input(s): PROBNP in the last 8760 hours.  CBG: No results for input(s): GLUCAP in the last 168 hours.  No results found for this or any previous visit (from the past 240 hour(s)).   Studies: No results found.  Scheduled Meds: . heparin subcutaneous  5,000 Units Subcutaneous 3 times per day  . metoCLOPramide (REGLAN) injection  5 mg Intravenous 4 times per day  . potassium chloride  40 mEq Oral BID   Continuous Infusions: . dextrose 5 % and 0.9% NaCl 100 mL/hr at 12/15/14 9562    Active Problems:   Abdominal pain   Gastroparesis   Cyclical vomiting   Nausea &  vomiting   Severe tetrahydrocannabinol (THC) dependence   Alcohol abuse    Time spent: 35 minutes    Nettie Wyffels  [facility] Pager [phone] If 7PM-7AM, please contact night-coverage at [email or web address], password [facility] 12/15/2014, 5:27 PM

## 2014-12-15 NOTE — Care Management Note (Signed)
Case Management Note  Patient Details  Name: Pauline AusWillie E Habenicht MRN: [id number] Date of Birth: 05/26/1981  Subjective/Objective:                    Action/Plan:   Expected Discharge Date:  12/15/14               Expected Discharge Plan:     In-House Referral:  Financial Counselor  Discharge planning Services  CM Consult, MATCH Program, Medication Assistance, [facility], Follow-up appt scheduled  Post Acute Care Choice:    Choice offered to:     DME Arranged:    DME Agency:     HH Arranged:    HH Agency:     Status of Service:     Medicare Important Message Given:    Date Medicare IM Given:    Medicare IM give by:    Date Additional Medicare IM Given:    Additional Medicare Important Message give by:     If discussed at Long Length of Stay Meetings, dates discussed:    Additional Comments:  [facility]   Doctor follow up Dec 19, 2014 at 2 pm , financial appointment Jan 09, 2015 at 330 pm   Kingsley PlanWile, [place] Sirois Marie, [place] 12/15/2014, 3:30 PM

## 2014-12-16 ENCOUNTER — Observation Stay (HOSPITAL_COMMUNITY): Payer: Self-pay

## 2014-12-16 ENCOUNTER — Encounter (HOSPITAL_COMMUNITY): Payer: Self-pay

## 2014-12-16 DIAGNOSIS — R17 Unspecified jaundice: Secondary | ICD-10-CM | POA: Insufficient documentation

## 2014-12-16 LAB — BASIC METABOLIC PANEL
Anion gap: 8 (ref 5–15)
BUN: 6 mg/dL (ref 6–20)
CO2: 28 mmol/L (ref 22–32)
Calcium: 9 mg/dL (ref 8.9–10.3)
Chloride: 102 mmol/L (ref 101–111)
Creatinine, Ser: 0.9 mg/dL (ref 0.61–1.24)
GFR calc Af Amer: >60 mL/min (ref >60)
GFR calc non Af Amer: >60 mL/min (ref >60)
Glucose, Bld: 99 mg/dL (ref 70–99)
Potassium: 3 mmol/L — ABNORMAL LOW (ref 3.5–5.1)
Sodium: 138 mmol/L (ref 135–145)

## 2014-12-16 LAB — POTASSIUM: Potassium: 3.4 mmol/L — ABNORMAL LOW (ref 3.5–5.1)

## 2014-12-16 MED ORDER — IOHEXOL 300 MG/ML  SOLN
100.0000 mL | Freq: Once | INTRAMUSCULAR | Status: AC | PRN
  Administered 2014-12-16: 100 mL via INTRAVENOUS

## 2014-12-16 MED ORDER — POTASSIUM CHLORIDE CRYS ER 20 MEQ PO TBCR
40.0000 meq | EXTENDED_RELEASE_TABLET | Freq: Two times a day (BID) | ORAL | Status: DC
  Administered 2014-12-16: 40 meq via ORAL
  Filled 2014-12-16: qty 2

## 2014-12-16 MED ORDER — POTASSIUM CHLORIDE 10 MEQ/100ML IV SOLN
10.0000 meq | INTRAVENOUS | Status: AC
  Administered 2014-12-16 (×2): 10 meq via INTRAVENOUS
  Filled 2014-12-16: qty 100

## 2014-12-16 NOTE — Progress Notes (Signed)
Discussed discharge summary with patient. Reviewed all medications with patient. Patient ready for discharge.  

## 2014-12-19 ENCOUNTER — Encounter: Payer: Self-pay | Admitting: Family Medicine

## 2014-12-19 ENCOUNTER — Ambulatory Visit: Payer: Self-pay | Attending: Family Medicine | Admitting: Family Medicine

## 2014-12-19 VITALS — BP 138/82 | HR 66 | Temp 98.4°F | Resp 18 | Ht 69.0 in | Wt 182.0 lb

## 2014-12-19 DIAGNOSIS — T407X5A Adverse effect of cannabis (derivatives), initial encounter: Secondary | ICD-10-CM | POA: Insufficient documentation

## 2014-12-19 DIAGNOSIS — F1290 Cannabis use, unspecified, uncomplicated: Secondary | ICD-10-CM | POA: Insufficient documentation

## 2014-12-19 DIAGNOSIS — L74519 Primary focal hyperhidrosis, unspecified: Secondary | ICD-10-CM

## 2014-12-19 DIAGNOSIS — K3184 Gastroparesis: Secondary | ICD-10-CM | POA: Insufficient documentation

## 2014-12-19 DIAGNOSIS — G43A0 Cyclical vomiting, not intractable: Secondary | ICD-10-CM | POA: Insufficient documentation

## 2014-12-19 DIAGNOSIS — K2990 Gastroduodenitis, unspecified, without bleeding: Secondary | ICD-10-CM | POA: Insufficient documentation

## 2014-12-19 DIAGNOSIS — R1115 Cyclical vomiting syndrome unrelated to migraine: Secondary | ICD-10-CM

## 2014-12-19 DIAGNOSIS — K2970 Gastritis, unspecified, without bleeding: Secondary | ICD-10-CM | POA: Insufficient documentation

## 2014-12-19 DIAGNOSIS — Z9049 Acquired absence of other specified parts of digestive tract: Secondary | ICD-10-CM | POA: Insufficient documentation

## 2014-12-19 DIAGNOSIS — Z87891 Personal history of nicotine dependence: Secondary | ICD-10-CM | POA: Insufficient documentation

## 2014-12-19 DIAGNOSIS — R17 Unspecified jaundice: Secondary | ICD-10-CM | POA: Insufficient documentation

## 2014-12-19 DIAGNOSIS — R61 Generalized hyperhidrosis: Secondary | ICD-10-CM | POA: Insufficient documentation

## 2014-12-19 LAB — CBC WITH DIFFERENTIAL/PLATELET
Basophils Absolute: 0 10*3/uL (ref 0.0–0.1)
Basophils Relative: 0 % (ref 0–1)
Eosinophils Absolute: 0.1 10*3/uL (ref 0.0–0.7)
Eosinophils Relative: 1 % (ref 0–5)
HCT: 38.8 % — ABNORMAL LOW (ref 39.0–52.0)
Hemoglobin: 12.6 g/dL — ABNORMAL LOW (ref 13.0–17.0)
Lymphocytes Relative: 44 % (ref 12–46)
Lymphs Abs: 2.7 10*3/uL (ref 0.7–4.0)
MCH: 31.4 pg (ref 26.0–34.0)
MCHC: 32.5 g/dL (ref 30.0–36.0)
MCV: 96.8 fL (ref 78.0–100.0)
MPV: 9 fL (ref 8.6–12.4)
Monocytes Absolute: 0.5 10*3/uL (ref 0.1–1.0)
Monocytes Relative: 8 % (ref 3–12)
Neutro Abs: 2.9 10*3/uL (ref 1.7–7.7)
Neutrophils Relative %: 47 % (ref 43–77)
Platelets: 315 10*3/uL (ref 150–400)
RBC: 4.01 MIL/uL — ABNORMAL LOW (ref 4.22–5.81)
RDW: 12.7 % (ref 11.5–15.5)
WBC: 6.1 10*3/uL (ref 4.0–10.5)

## 2014-12-19 LAB — COMPREHENSIVE METABOLIC PANEL
ALT: 17 U/L (ref 0–53)
AST: 18 U/L (ref 0–37)
Albumin: 4.2 g/dL (ref 3.5–5.2)
Alkaline Phosphatase: 54 U/L (ref 39–117)
BUN: 9 mg/dL (ref 6–23)
CO2: 31 mEq/L (ref 19–32)
Calcium: 9.5 mg/dL (ref 8.4–10.5)
Chloride: 100 mEq/L (ref 96–112)
Creat: 0.88 mg/dL (ref 0.50–1.35)
Glucose, Bld: 76 mg/dL (ref 70–99)
Potassium: 4.1 mEq/L (ref 3.5–5.3)
Sodium: 140 mEq/L (ref 135–145)
Total Bilirubin: 2 mg/dL — ABNORMAL HIGH (ref 0.2–1.2)
Total Protein: 6.8 g/dL (ref 6.0–8.3)

## 2014-12-19 LAB — TSH: TSH: 2.528 u[IU]/mL (ref 0.350–4.500)

## 2014-12-19 MED ORDER — OMEPRAZOLE 40 MG PO CPDR: 40.0000 mg | DELAYED_RELEASE_CAPSULE | Freq: Every day | ORAL | Status: DC

## 2014-12-19 NOTE — Progress Notes (Signed)
Subjective:    Patient ID: Matthew Scott, male    DOB: [birth year], 34 y.o.   MRN: [id number]  HPI   Admit date 12/14/2014   Discharge date : 12/16/2014     Alyn Jurney has had multiple admissions  for nausea and vomiting thought to be secondary to gastroparesis versus  THC induced cyclical vomiting and prior to this admission was discharged on 12/08/14 for same.   He was placed on clear liquid diet and labs were sent off which revealed elevated bilirubin  3.4 and mild leukocytosis of 10.9 ; he was also hypokalemic 3.1. CT abdomen and pelvis revealed status post cholecystectomy and upper right pole  renal calculus measuring 3 mm. His potassium was repletted,  symptoms  Improved and his diet was advanced and he was subsequently discharged.    Interval History: States the nausea and vomiting has stopped. C/o sweating at night and wakes up several times to  Use the bathroom but has no history of diabetes and he attributes symptoms to his current medications because when he doesn't take them he feels just fine. He also endorses epigastric pain Which is worse when he eats spicy foods. Also complains of sedation.  Past Medical History  Diagnosis Date  . Asthma   . Chronic abdominal pain   . Nausea and vomiting     chronic, recurrent  . Polysubstance abuse   . Gastroparesis   . 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 GASTRITIS 12/09/2009    Qualifier: Diagnosis of  By: Daphine Deutscher FNP, Zena Amos      Past Surgical History  Procedure Laterality Date  . Cholecystectomy  11/2009    Family History  Problem Relation Age of Onset  . Other Father   . Diabetes Father   . Hypertension Father     History   Social History  . Marital Status: Single    Spouse Name: N/A  . Number of Children: N/A  . Years of Education: N/A   Occupational History  .  Unemployed.    Social History Main Topics  . Smoking status: Former Smoker -- 8 years    Types: Cigars    Quit date: 12/05/2014  . Smokeless tobacco: Never Used  . Alcohol Use: 0.0 oz/week    0 Standard drinks or equivalent per week     Comment: 12/16/2014 " I no Longer drink ,I quit about 2 years ago "  . Drug Use: Yes    Special: Marijuana     Comment: Marijuana, has not used marijuana since 12/05/14  . Sexual Activity: Yes   Other Topics Concern  . Not on file   Social History Narrative   Single, lives with parents.  Ambulatory.    No Known Allergies  Current Outpatient Prescriptions on File Prior to Visit  Medication Sig Dispense Refill  . ibuprofen (ADVIL,MOTRIN) 400 MG tablet Take 1 tablet (400 mg total) by mouth every 6 (six) hours as needed. 30 tablet 0  . LORazepam (ATIVAN) 1 MG tablet Take 1 tablet (1 mg total) by mouth every 8 (eight) hours as needed (nausea). 12 tablet 0  . metoCLOPramide (REGLAN) 10 MG tablet Take 1 tablet (10 mg total) by mouth every 6 (six) hours. 30 tablet 0   No current facility-administered medications on file prior to visit.  Review of Systems  Constitutional: Negative for activity change and appetite change.  HENT: Negative for sinus pressure and sore throat.   Eyes: Negative for visual disturbance.  Respiratory: Negative for chest tightness and shortness of breath.   Cardiovascular: Negative for chest pain and palpitations.  Gastrointestinal:       See HPI  Endocrine: Negative for cold intolerance, heat intolerance and polyphagia.  Genitourinary: Negative for dysuria, frequency and difficulty urinating.  Musculoskeletal: Negative for back pain, joint swelling and arthralgias.  Skin: Negative for color change.  Neurological: Negative for dizziness, tremors and weakness.  Psychiatric/Behavioral: Negative for suicidal ideas and behavioral problems.        Objective: Filed Vitals:   12/19/14 1402  BP: 138/82  Pulse: 66    Temp: 98.4 F (36.9 C)  Resp: 18      Physical Exam  Constitutional: He is oriented to person, place, and time. He appears well-developed and well-nourished.  HENT:  Head: Normocephalic and atraumatic.  Right Ear: External ear normal.  Left Ear: External ear normal.  Eyes: Conjunctivae and EOM are normal. Pupils are equal, round, and reactive to light.  Neck: Normal range of motion. Neck supple. No tracheal deviation present.  Cardiovascular: Normal rate, regular rhythm and normal heart sounds.   No murmur heard. Pulmonary/Chest: Effort normal and breath sounds normal. No respiratory distress. He has no wheezes. He exhibits no tenderness.  Abdominal: Soft. Bowel sounds are normal. He exhibits no mass. There is no tenderness.  Musculoskeletal: Normal range of motion. He exhibits no edema or tenderness.  Neurological: He is alert and oriented to person, place, and time.  Skin: Skin is warm and dry.  Psychiatric: He has a normal mood and affect.     EXAM: CT ABDOMEN AND PELVIS WITH CONTRAST  TECHNIQUE: Multidetector CT imaging of the abdomen and pelvis was performed using the standard protocol following bolus administration of intravenous contrast.  CONTRAST: 100mL OMNIPAQUE IOHEXOL 300 MG/ML SOLN  COMPARISON: 12/05/2014  FINDINGS: There is cholecystectomy. There are normal appearances of the liver and bile ducts. There are unremarkable appearances of the gallbladder fossa.  The spleen, pancreas, adrenals and kidneys appear normal except for a 3 mm upper pole right collecting system calculus. There is no hydronephrosis. There is no ureteral dilatation.  Bowel is unremarkable.  The abdominal aorta is normal in caliber without atherosclerotic calcification.  There is no significant abnormality in the lower chest.  There is no significant musculoskeletal abnormality.  IMPRESSION: *Unremarkable post cholecystectomy appearances of the bile ducts  and gallbladder fossa. *Nonobstructing upper pole right collecting system calculus measuring 3 mm.   Electronically Signed  By: Ellery Plunkaniel R Mitchell M.D.  On: 12/16/2014 02:31      Assessment & Plan:   34 year old male with a history of gastroparesis and recent hospitalization [facility]ical [facility].    Gastroparesis/ cyclical vomiting:   Symptoms have  subsided at this time.  He is on both Phenergan and Reglan for emesis and so I have discontinued the Phenergan since he complains of sedation.  Marijuana is the inciting agent  And his last use was 2 weeks ago.   We'll send off CBC, CMP.    Gastritis:   Dietary modifications to avoid late meals and avoid foods that trigger symptoms.  Commenced on omeprazole and I will reassess symptoms at his next office visit.    Hyperhidrosis :  He attributes this to side effects of Reglan but I will send off a  TSH to exclude thyroid disorder.   Hyperbilirubinemia:   Unclear etiology since he is status post cholecystectomy   we'll repeat CMP.

## 2014-12-19 NOTE — Patient Instructions (Signed)

## 2014-12-19 NOTE — Progress Notes (Signed)
Patient hospitalized last Friday-Monday for nausea and vomiting. Patient reports feeling better, denies vomiting. Has medication for nausea. Patient reports no pain at this time.

## 2014-12-22 ENCOUNTER — Telehealth: Payer: Self-pay

## 2014-12-22 NOTE — Telephone Encounter (Signed)
-----   Message from Jaclyn ShaggyEnobong Amao, MD sent at 12/22/2014  9:58 AM EDT ----- Reina FuseWbc has trended down, bilirubin is still elevated; encouraged to quit drinking alcohol as this could be a possible etiology.

## 2014-12-22 NOTE — Telephone Encounter (Signed)
Nurse called patient, reached voicemail. Left message for patient to call Mylee Falin at [phone].   

## 2014-12-22 NOTE — Telephone Encounter (Signed)
Nurse called patient, reached voicemail. Nurse did not leave message because voicemail recording did not include patients name or telephone number. Nurse will return call at later time.  

## 2014-12-23 NOTE — Telephone Encounter (Signed)
Nurse called patient, reached voicemail. Left message for patient to call Matthew Scott at [phone].   

## 2014-12-24 NOTE — Telephone Encounter (Signed)
Nurse called patient, patient verified date of birth. Patient aware of WBCs coming down and bilirubin elevated. Nurse advised patient to stop drinking alcohol. Patient reports no alcohol in 3-4 weeks. Patient voices understanding of lab results and recommendations.

## 2015-01-02 ENCOUNTER — Ambulatory Visit: Payer: Self-pay | Admitting: Family Medicine

## 2015-01-06 ENCOUNTER — Other Ambulatory Visit: Payer: Self-pay | Admitting: Emergency Medicine

## 2015-01-06 ENCOUNTER — Other Ambulatory Visit: Payer: Self-pay | Admitting: Family Medicine

## 2015-01-06 DIAGNOSIS — K3184 Gastroparesis: Secondary | ICD-10-CM

## 2015-01-06 MED ORDER — METOCLOPRAMIDE HCL 10 MG PO TABS: 10.0000 mg | ORAL_TABLET | Freq: Three times a day (TID) | ORAL | Status: DC | PRN

## 2015-01-06 NOTE — Telephone Encounter (Signed)
Patient is requesting refill on medication  Pharmacy is [facility] [address]

## 2015-01-06 NOTE — Telephone Encounter (Signed)
Please inform him I have refilled Reglan; he has no indication for Ativan and he will have to obtain that from the prescribing physician or his PCP.

## 2015-01-07 ENCOUNTER — Telehealth: Payer: Self-pay | Admitting: Family Medicine

## 2015-01-07 MED ORDER — PROMETHAZINE HCL 25 MG PO TABS: 25.0000 mg | ORAL_TABLET | Freq: Three times a day (TID) | ORAL | Status: DC | PRN

## 2015-01-07 NOTE — Telephone Encounter (Signed)
Patient called requesting medication refill on promethazine (PHENERGAN) 25 MG tablet. Please f/u with patient

## 2015-01-07 NOTE — Telephone Encounter (Signed)
Medication sent to pharmacy  

## 2015-01-07 NOTE — Telephone Encounter (Signed)
Pharmacist called from [facility] regarding medication interaction between Phenergan and Metoclopramide please call him at [phone]

## 2015-01-08 NOTE — Telephone Encounter (Signed)
[facility] pharmacy calling to follow upon drug interaction regarding promethazine.

## 2015-01-08 NOTE — Telephone Encounter (Signed)
Please inform the patient that he will have to remain on metoclopramide due to a call from the pharmacy about interactions with phernegan

## 2015-01-08 NOTE — Telephone Encounter (Signed)
Spoke to pharmacist about interaction between promethazine and metoclopramide. Look at last office visit note stating "He is on both Phenergan and Reglan for emesis and so I have discontinued the Phenergan since he complains of sedation.". Pharmacy aware of discontinuing phenergan.

## 2015-01-09 ENCOUNTER — Ambulatory Visit: Payer: Self-pay

## 2015-01-13 ENCOUNTER — Emergency Department (HOSPITAL_COMMUNITY)
Admission: EM | Admit: 2015-01-13 | Discharge: 2015-01-14 | Disposition: A | Discharge status: Home / Self Care | Payer: Self-pay | Attending: Emergency Medicine | Admitting: Emergency Medicine

## 2015-01-13 DIAGNOSIS — Z8719 Personal history of other diseases of the digestive system: Secondary | ICD-10-CM | POA: Insufficient documentation

## 2015-01-13 DIAGNOSIS — J45909 Unspecified asthma, uncomplicated: Secondary | ICD-10-CM | POA: Insufficient documentation

## 2015-01-13 DIAGNOSIS — R112 Nausea with vomiting, unspecified: Secondary | ICD-10-CM | POA: Insufficient documentation

## 2015-01-13 DIAGNOSIS — Z79899 Other long term (current) drug therapy: Secondary | ICD-10-CM | POA: Insufficient documentation

## 2015-01-13 DIAGNOSIS — R1084 Generalized abdominal pain: Secondary | ICD-10-CM | POA: Insufficient documentation

## 2015-01-13 DIAGNOSIS — Z87891 Personal history of nicotine dependence: Secondary | ICD-10-CM | POA: Insufficient documentation

## 2015-01-13 DIAGNOSIS — Z8619 Personal history of other infectious and parasitic diseases: Secondary | ICD-10-CM | POA: Insufficient documentation

## 2015-01-13 DIAGNOSIS — G8929 Other chronic pain: Secondary | ICD-10-CM | POA: Insufficient documentation

## 2015-01-13 NOTE — ED Notes (Signed)
Bed: [facility]2 Expected date:  Expected time:  Means of arrival:  Comments: EMS 

## 2015-01-13 NOTE — ED Notes (Signed)
Pt presents via EMS from home with c/o vomiting. Pt reports he has been vomiting for one day. Pt also c/o abdominal pain.

## 2015-01-14 ENCOUNTER — Emergency Department (HOSPITAL_COMMUNITY)
Admission: EM | Admit: 2015-01-14 | Discharge: 2015-01-14 | Disposition: A | Discharge status: Home / Self Care | Payer: Self-pay | Attending: Emergency Medicine | Admitting: Emergency Medicine

## 2015-01-14 ENCOUNTER — Encounter (HOSPITAL_COMMUNITY): Payer: Self-pay | Admitting: Emergency Medicine

## 2015-01-14 ENCOUNTER — Encounter (HOSPITAL_COMMUNITY): Payer: Self-pay

## 2015-01-14 DIAGNOSIS — Z79899 Other long term (current) drug therapy: Secondary | ICD-10-CM | POA: Insufficient documentation

## 2015-01-14 DIAGNOSIS — Z9049 Acquired absence of other specified parts of digestive tract: Secondary | ICD-10-CM | POA: Insufficient documentation

## 2015-01-14 DIAGNOSIS — J45909 Unspecified asthma, uncomplicated: Secondary | ICD-10-CM | POA: Insufficient documentation

## 2015-01-14 DIAGNOSIS — Z87891 Personal history of nicotine dependence: Secondary | ICD-10-CM | POA: Insufficient documentation

## 2015-01-14 DIAGNOSIS — K3184 Gastroparesis: Secondary | ICD-10-CM | POA: Insufficient documentation

## 2015-01-14 DIAGNOSIS — Z8619 Personal history of other infectious and parasitic diseases: Secondary | ICD-10-CM | POA: Insufficient documentation

## 2015-01-14 DIAGNOSIS — G8929 Other chronic pain: Secondary | ICD-10-CM | POA: Insufficient documentation

## 2015-01-14 LAB — CBC
HCT: 39.4 % (ref 39.0–52.0)
Hemoglobin: 13.4 g/dL (ref 13.0–17.0)
MCH: 31.9 pg (ref 26.0–34.0)
MCHC: 34 g/dL (ref 30.0–36.0)
MCV: 93.8 fL (ref 78.0–100.0)
Platelets: 273 10*3/uL (ref 150–400)
RBC: 4.2 MIL/uL — ABNORMAL LOW (ref 4.22–5.81)
RDW: 12.4 % (ref 11.5–15.5)
WBC: 15.1 10*3/uL — ABNORMAL HIGH (ref 4.0–10.5)

## 2015-01-14 LAB — CBC WITH DIFFERENTIAL/PLATELET
Basophils Absolute: 0 10*3/uL (ref 0.0–0.1)
Basophils Relative: 0 % (ref 0–1)
Eosinophils Absolute: 0 10*3/uL (ref 0.0–0.7)
Eosinophils Relative: 0 % (ref 0–5)
HCT: 39.8 % (ref 39.0–52.0)
Hemoglobin: 12.9 g/dL — ABNORMAL LOW (ref 13.0–17.0)
Lymphocytes Relative: 14 % (ref 12–46)
Lymphs Abs: 1.5 10*3/uL (ref 0.7–4.0)
MCH: 31.4 pg (ref 26.0–34.0)
MCHC: 32.4 g/dL (ref 30.0–36.0)
MCV: 96.8 fL (ref 78.0–100.0)
Monocytes Absolute: 0.7 10*3/uL (ref 0.1–1.0)
Monocytes Relative: 6 % (ref 3–12)
Neutro Abs: 9.1 10*3/uL — ABNORMAL HIGH (ref 1.7–7.7)
Neutrophils Relative %: 80 % — ABNORMAL HIGH (ref 43–77)
Platelets: 282 10*3/uL (ref 150–400)
RBC: 4.11 MIL/uL — ABNORMAL LOW (ref 4.22–5.81)
RDW: 12.2 % (ref 11.5–15.5)
WBC: 11.3 10*3/uL — ABNORMAL HIGH (ref 4.0–10.5)

## 2015-01-14 LAB — URINALYSIS, ROUTINE W REFLEX MICROSCOPIC
Glucose, UA: NEGATIVE mg/dL
Ketones, ur: 15 mg/dL — AB
Leukocytes, UA: NEGATIVE
Nitrite: NEGATIVE
Protein, ur: NEGATIVE mg/dL
Specific Gravity, Urine: 1.034 — ABNORMAL HIGH (ref 1.005–1.030)
Urobilinogen, UA: 0.2 mg/dL (ref 0.0–1.0)
pH: 5.5 (ref 5.0–8.0)

## 2015-01-14 LAB — COMPREHENSIVE METABOLIC PANEL
ALT: 41 U/L (ref 17–63)
ALT: 36 U/L (ref 17–63)
AST: 27 U/L (ref 15–41)
AST: 23 U/L (ref 15–41)
Albumin: 4.7 g/dL (ref 3.5–5.0)
Albumin: 4.5 g/dL (ref 3.5–5.0)
Alkaline Phosphatase: 69 U/L (ref 38–126)
Alkaline Phosphatase: 66 U/L (ref 38–126)
Anion gap: 12 (ref 5–15)
Anion gap: 13 (ref 5–15)
BUN: 13 mg/dL (ref 6–20)
BUN: 13 mg/dL (ref 6–20)
CO2: 25 mmol/L (ref 22–32)
CO2: 23 mmol/L (ref 22–32)
Calcium: 10 mg/dL (ref 8.9–10.3)
Calcium: 10 mg/dL (ref 8.9–10.3)
Chloride: 104 mmol/L (ref 101–111)
Chloride: 102 mmol/L (ref 101–111)
Creatinine, Ser: 0.87 mg/dL (ref 0.61–1.24)
Creatinine, Ser: 1.06 mg/dL (ref 0.61–1.24)
GFR calc Af Amer: >60 mL/min (ref >60)
GFR calc Af Amer: >60 mL/min (ref >60)
GFR calc non Af Amer: >60 mL/min (ref >60)
GFR calc non Af Amer: >60 mL/min (ref >60)
Glucose, Bld: 162 mg/dL — ABNORMAL HIGH (ref 65–99)
Glucose, Bld: 119 mg/dL — ABNORMAL HIGH (ref 65–99)
Potassium: 3.3 mmol/L — ABNORMAL LOW (ref 3.5–5.1)
Potassium: 3.9 mmol/L (ref 3.5–5.1)
Sodium: 141 mmol/L (ref 135–145)
Sodium: 138 mmol/L (ref 135–145)
Total Bilirubin: 1.5 mg/dL — ABNORMAL HIGH (ref 0.3–1.2)
Total Bilirubin: 1.7 mg/dL — ABNORMAL HIGH (ref 0.3–1.2)
Total Protein: 8.3 g/dL — ABNORMAL HIGH (ref 6.5–8.1)
Total Protein: 8.2 g/dL — ABNORMAL HIGH (ref 6.5–8.1)

## 2015-01-14 LAB — URINE MICROSCOPIC-ADD ON

## 2015-01-14 MED ORDER — SODIUM CHLORIDE 0.9 % IV BOLUS (SEPSIS)
1000.0000 mL | Freq: Once | INTRAVENOUS | Status: AC
  Administered 2015-01-14: 1000 mL via INTRAVENOUS

## 2015-01-14 MED ORDER — LORAZEPAM 2 MG/ML IJ SOLN
1.0000 mg | Freq: Once | INTRAMUSCULAR | Status: AC
  Administered 2015-01-14: 1 mg via INTRAVENOUS
  Filled 2015-01-14: qty 1

## 2015-01-14 MED ORDER — PROMETHAZINE HCL 25 MG RE SUPP: 25.0000 mg | Freq: Four times a day (QID) | RECTAL | Status: DC | PRN

## 2015-01-14 MED ORDER — ONDANSETRON 4 MG PO TBDP: 4.0000 mg | ORAL_TABLET | Freq: Three times a day (TID) | ORAL | Status: DC | PRN

## 2015-01-14 MED ORDER — SODIUM CHLORIDE 0.9 % IV SOLN
INTRAVENOUS | Status: DC
  Administered 2015-01-14: 03:00:00 via INTRAVENOUS

## 2015-01-14 MED ORDER — KETOROLAC TROMETHAMINE 30 MG/ML IJ SOLN
30.0000 mg | Freq: Once | INTRAMUSCULAR | Status: AC
  Administered 2015-01-14: 30 mg via INTRAVENOUS
  Filled 2015-01-14: qty 1

## 2015-01-14 MED ORDER — LORAZEPAM 1 MG PO TABS
1.0000 mg | ORAL_TABLET | Freq: Once | ORAL | Status: AC
  Administered 2015-01-14: 1 mg via ORAL
  Filled 2015-01-14: qty 1

## 2015-01-14 MED ORDER — PROMETHAZINE HCL 25 MG PO TABS: 25.0000 mg | ORAL_TABLET | Freq: Four times a day (QID) | ORAL | Status: DC | PRN

## 2015-01-14 MED ORDER — PROMETHAZINE HCL 25 MG/ML IJ SOLN
25.0000 mg | Freq: Once | INTRAMUSCULAR | Status: AC
  Administered 2015-01-14: 25 mg via INTRAVENOUS
  Filled 2015-01-14: qty 1

## 2015-01-14 NOTE — ED Provider Notes (Signed)
CSN: [id number]     Arrival date & time 01/13/15  2340 History   First MD Initiated Contact with Patient 01/14/15 0251     Chief Complaint  Patient presents with  . Emesis  . Abdominal Pain     (Consider location/radiation/quality/duration/timing/severity/associated sxs/prior Treatment) HPI Comments: Patient with a history of gastroparesis was non-insulin-dependent diabetic, states that he started having episodes of vomiting tonight with abdominal pain.  He tried taking "the green pill" without resolution of his symptoms.  He does state that he smoked marijuana tonight, which is presumed to be the cause of his gastroparesis.  Patient is a 34 y.o. male presenting with vomiting and abdominal pain. The history is provided by the patient.  Emesis Severity:  Moderate Timing:  Constant Quality:  Bilious material Progression:  Unchanged Chronicity:  Chronic Relieved by:  Nothing Worsened by:  Nothing tried Ineffective treatments:  Antiemetics Associated symptoms: abdominal pain   Associated symptoms: no headaches   Abdominal Pain Associated symptoms: nausea and vomiting   Associated symptoms: no chest pain, no dysuria, no fever and no shortness of breath     Past Medical History  Diagnosis Date  . Asthma   . Chronic abdominal pain   . Nausea and vomiting     chronic, recurrent  . Polysubstance abuse   . Gastroparesis   . 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 GASTRITIS 12/09/2009    Qualifier: Diagnosis of  By: Daphine DeutscherMartin FNP, Zena AmosNykedtra     Past Surgical History  Procedure Laterality Date  . Cholecystectomy  11/2009   Family History  Problem Relation Age of Onset  . Other Father   . Diabetes Father   . Hypertension Father    History  Substance Use Topics  . Smoking status: Former Smoker -- 8 years    Types: Cigars    Quit date:  12/05/2014  . Smokeless tobacco: Never Used  . Alcohol Use: 0.0 oz/week    0 Standard drinks or equivalent per week     Comment: 12/16/2014 " I no Longer drink ,I quit about 2 years ago "    Review of Systems  Constitutional: Negative for fever.  Respiratory: Negative for shortness of breath.   Cardiovascular: Negative for chest pain.  Gastrointestinal: Positive for nausea, vomiting and abdominal pain.  Genitourinary: Negative for dysuria.  Skin: Negative for rash.  Neurological: Negative for dizziness and headaches.  All other systems reviewed and are negative.     Allergies  Review of patient's allergies indicates no known allergies.  Home Medications   Prior to Admission medications   Medication Sig Start Date End Date Taking? Authorizing Provider  omeprazole (PRILOSEC) 40 MG capsule Take 1 capsule (40 mg total) by mouth daily. 12/19/14  Yes Jaclyn ShaggyEnobong Amao, MD  ibuprofen (ADVIL,MOTRIN) 400 MG tablet Take 1 tablet (400 mg total) by mouth every 6 (six) hours as needed. Patient not taking: Reported on 01/13/2015 12/12/14   Joycie PeekBenjamin Cartner, PA-C  LORazepam (ATIVAN) 1 MG tablet Take 1 tablet (1 mg total) by mouth every 8 (eight) hours as needed (nausea). Patient not taking: Reported on 01/13/2015 12/13/14   Azalia BilisKevin Campos, MD  metoCLOPramide (REGLAN) 10 MG tablet Take 1 tablet (10 mg total) by mouth every 8 (eight) hours as needed for nausea. Patient not taking: Reported on 01/13/2015 01/06/15   Enobong  Venetia Night, MD  metoCLOPramide (REGLAN) 10 MG tablet Take 1 tablet (10 mg total) by mouth every 8 (eight) hours as needed for nausea. 01/06/15   Jaclyn Shaggy, MD  promethazine (PHENERGAN) 25 MG tablet Take 1 tablet (25 mg total) by mouth every 8 (eight) hours as needed for nausea or vomiting. 01/07/15   Jaclyn Shaggy, MD   BP 148/78 mmHg  Pulse 55  Temp(Src) 98.3 F (36.8 C) (Oral)  Resp 16  SpO2 96% Physical Exam  Constitutional: He is oriented to person, place, and time. He appears  well-developed and well-nourished.  HENT:  Head: Normocephalic.  Eyes: Pupils are equal, round, and reactive to light.  Neck: Normal range of motion.  Cardiovascular: Normal rate and regular rhythm.   Pulmonary/Chest: Effort normal and breath sounds normal.  Abdominal: He exhibits no distension. There is generalized tenderness.  Musculoskeletal: Normal range of motion.  Neurological: He is alert and oriented to person, place, and time.  Skin: Skin is warm and dry.  Nursing note and vitals reviewed.   ED Course  Procedures (including critical care time) Labs Review Labs Reviewed  CBC WITH DIFFERENTIAL/PLATELET - Abnormal; Notable for the following:    WBC 11.3 (*)    RBC 4.11 (*)    Hemoglobin 12.9 (*)    Neutrophils Relative % 80 (*)    Neutro Abs 9.1 (*)    All other components within normal limits  COMPREHENSIVE METABOLIC PANEL - Abnormal; Notable for the following:    Potassium 3.3 (*)    Glucose, Bld 162 (*)    Total Protein 8.3 (*)    Total Bilirubin 1.5 (*)    All other components within normal limits  URINALYSIS, ROUTINE W REFLEX MICROSCOPIC (NOT AT [facility]) - Abnormal; Notable for the following:    Color, Urine AMBER (*)    Specific Gravity, Urine 1.034 (*)    Hgb urine dipstick TRACE (*)    Bilirubin Urine SMALL (*)    Ketones, ur 15 (*)    All other components within normal limits  URINE MICROSCOPIC-ADD ON    Imaging Review No results found.   EKG Interpretation None     Patient had IV fluid, IV, Ativan for nausea.  He's had no further episodes of vomiting.  He's been discharged home.  Follow-up with his primary care physician MDM   Final diagnoses:  Non-intractable vomiting with nausea, vomiting of unspecified type         Earley Favor, NP 01/14/15 1610  Derwood Kaplan, MD 01/18/15 1556

## 2015-01-14 NOTE — ED Notes (Signed)
Per EMS:  Pt to [facility] yesterday for same.  Pt called c/o abdominal pain.  Sts he had his gallbladder out a few years ago, and he has pain flair ups.  Pt sts he has had n/v/ and some diarrhea since yesterday.

## 2015-01-14 NOTE — ED Provider Notes (Signed)
CSN: [id number]     Arrival date & time 01/14/15  1731 History   First MD Initiated Contact with Patient 01/14/15 1739     Chief Complaint  Patient presents with  . Abdominal Pain     (Consider location/radiation/quality/duration/timing/severity/associated sxs/prior Treatment) HPI Comments: The patient is a 34 year old male, he is known to have gastroparesis based on a 2012 gastric emptying study. He is not a diabetic, he does have a history of polysubstance abuse and biliary dyskinesia that led to a cholecystectomy in the past. He states that he had recurrence of nausea vomiting and abdominal discomfort which occurred yesterday, he was seen for this last night in the emergency department, discharged at 2:30 this morning and had tried to fill the prescription that he was given, he states that he vomited this medication up and has been nauseated all day. He is trying to drink fluids, has pain that is mostly right upper quadrant but feels this all over his abdomen, it does not radiate to his back, it is not associated with fevers chills or diarrhea, it is made worse by trying to eat. He has been able to tolerate clear fluids.  Patient is a 34 y.o. male presenting with abdominal pain. The history is provided by the patient.  Abdominal Pain   Past Medical History  Diagnosis Date  . Asthma   . Chronic abdominal pain   . Nausea and vomiting     chronic, recurrent  . Polysubstance abuse   . Gastroparesis   . 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 GASTRITIS 12/09/2009    Qualifier: Diagnosis of  By: Daphine Deutscher FNP, Zena Amos     Past Surgical History  Procedure Laterality Date  . Cholecystectomy  11/2009   Family History  Problem Relation Age of Onset  . Other Father   . Diabetes Father   . Hypertension Father    History  Substance Use Topics  .  Smoking status: Former Smoker -- 8 years    Types: Cigars    Quit date: 12/05/2014  . Smokeless tobacco: Never Used  . Alcohol Use: 0.0 oz/week    0 Standard drinks or equivalent per week     Comment: 12/16/2014 " I no Longer drink ,I quit about 2 years ago "    Review of Systems  Gastrointestinal: Positive for abdominal pain.  All other systems reviewed and are negative.     Allergies  Review of patient's allergies indicates no known allergies.  Home Medications   Prior to Admission medications   Medication Sig Start Date End Date Taking? Authorizing Provider  metoCLOPramide (REGLAN) 10 MG tablet Take 1 tablet (10 mg total) by mouth every 8 (eight) hours as needed for nausea. 01/06/15  Yes Jaclyn Shaggy, MD  omeprazole (PRILOSEC) 40 MG capsule Take 1 capsule (40 mg total) by mouth daily. 12/19/14  Yes Jaclyn Shaggy, MD  ibuprofen (ADVIL,MOTRIN) 400 MG tablet Take 1 tablet (400 mg total) by mouth every 6 (six) hours as needed. Patient not taking: Reported on 01/13/2015 12/12/14   Joycie Peek, PA-C  ondansetron (ZOFRAN ODT) 4 MG disintegrating tablet Take 1 tablet (4 mg total) by mouth every 8 (eight) hours as needed for nausea. 01/14/15   Eber Hong, MD  promethazine (PHENERGAN) 25 MG suppository Place 1 suppository (25 mg total) rectally every 6 (six)  hours as needed for nausea or vomiting. 01/14/15   Eber Hong, MD  promethazine (PHENERGAN) 25 MG tablet Take 1 tablet (25 mg total) by mouth every 6 (six) hours as needed for nausea or vomiting. 01/14/15   Eber Hong, MD   BP 126/63 mmHg  Pulse 96  Temp(Src) 99.2 F (37.3 C) (Oral)  Resp 18  SpO2 100% Physical Exam  Constitutional: He appears well-developed and well-nourished. No distress.  HENT:  Head: Normocephalic and atraumatic.  Mouth/Throat: Oropharynx is clear and moist. No oropharyngeal exudate.  Normal appearing mucous membranes  Eyes: Conjunctivae and EOM are normal. Pupils are equal, round, and reactive to light.  Right eye exhibits no discharge. Left eye exhibits no discharge. No scleral icterus.  Neck: Normal range of motion. Neck supple. No JVD present. No thyromegaly present.  Supple neck, no lymphadenopathy  Cardiovascular: Normal rate, regular rhythm, normal heart sounds and intact distal pulses.  Exam reveals no gallop and no friction rub.   No murmur heard. Normal heart sounds, no murmur, strong pulses, no tachycardia  Pulmonary/Chest: Effort normal and breath sounds normal. No respiratory distress. He has no wheezes. He has no rales.  Clear lung sounds, no respiratory distress, speaks in full sentences  Abdominal: Soft. Bowel sounds are normal. He exhibits no distension and no mass. There is tenderness (nonfocal diffuse mild abdominal tenderness without guarding or masses).  No tympanitic sounds to percussion, no distention, no peritoneal signs  Musculoskeletal: Normal range of motion. He exhibits no edema or tenderness.  Normal lower extremities, no edema, no asymmetry, soft compartments  Lymphadenopathy:    He has no cervical adenopathy.  Neurological: He is alert. Coordination normal.  Moves all extremities 4 without difficulty, speech is clear, strength is normal  Skin: Skin is warm and dry. No rash noted. No erythema.  No rashes to the skin  Psychiatric: He has a normal mood and affect. His behavior is normal.  Nursing note and vitals reviewed.   ED Course  Procedures (including critical care time) Labs Review Labs Reviewed  COMPREHENSIVE METABOLIC PANEL - Abnormal; Notable for the following:    Glucose, Bld 119 (*)    Total Protein 8.2 (*)    Total Bilirubin 1.7 (*)    All other components within normal limits  CBC - Abnormal; Notable for the following:    WBC 15.1 (*)    RBC 4.20 (*)    All other components within normal limits    Imaging Review No results found.    MDM   Final diagnoses:  Gastroparesis    Overall the patient does not have an acute abdomen, I  doubt that his nausea and vomiting is related to a pathologic process, his laboratory results were reviewed from 16 hours ago at which time they showed a white blood cell count of 11,300, hemoglobin of 12.9, potassium 3.3, glucose of 160, normal liver function tests except for a bilirubin of 1.5 and a urinalysis showing no signs of infection but a high specific gravity. He did have small amount of ketones. He will be given IV fluids, recheck liver panel, antinausea medication, anticipate discharge when symptoms improve and less significant underlying pathology is found by blood work. Imaging is not indicated at this time.  Improved, no sx at this time, fluids and meds given as below. VS normal, Pt agreeable to d/c.  Meds given in ED:  Medications  promethazine (PHENERGAN) injection 25 mg (25 mg Intravenous Given 01/14/15 1829)  sodium chloride 0.9 % bolus  1,000 mL (0 mLs Intravenous Stopped 01/14/15 2212)  sodium chloride 0.9 % bolus 1,000 mL (0 mLs Intravenous Stopped 01/14/15 2212)  ketorolac (TORADOL) 30 MG/ML injection 30 mg (30 mg Intravenous Given 01/14/15 1829)    New Prescriptions   ONDANSETRON (ZOFRAN ODT) 4 MG DISINTEGRATING TABLET    Take 1 tablet (4 mg total) by mouth every 8 (eight) hours as needed for nausea.   PROMETHAZINE (PHENERGAN) 25 MG SUPPOSITORY    Place 1 suppository (25 mg total) rectally every 6 (six) hours as needed for nausea or vomiting.   PROMETHAZINE (PHENERGAN) 25 MG TABLET    Take 1 tablet (25 mg total) by mouth every 6 (six) hours as needed for nausea or vomiting.      Eber HongBrian Lashonna Rieke, MD 01/14/15 [phone]

## 2015-01-14 NOTE — Discharge Instructions (Signed)
Nausea and Vomiting Nausea means you feel sick to your stomach. Throwing up (vomiting) is a reflex where stomach contents come out of your mouth. HOME CARE   Take medicine as told by your doctor.  Do not force yourself to eat. However, you do need to drink fluids.  If you feel like eating, eat a normal diet as told by your doctor.  Eat rice, wheat, potatoes, bread, lean meats, yogurt, fruits, and vegetables.  Avoid high-fat foods.  Drink enough fluids to keep your pee (urine) clear or pale yellow.  Ask your doctor how to replace body fluid losses (rehydrate). Signs of body fluid loss (dehydration) include:  Feeling very thirsty.  Dry lips and mouth.  Feeling dizzy.  Dark pee.  Peeing less than normal.  Feeling confused.  Fast breathing or heart rate. GET HELP RIGHT AWAY IF:   You have blood in your throw up.  You have black or bloody poop (stool).  You have a bad headache or stiff neck.  You feel confused.  You have bad belly (abdominal) pain.  You have chest pain or trouble breathing.  You do not pee at least once every 8 hours.  You have cold, clammy skin.  You keep throwing up after 24 to 48 hours.  You have a fever. MAKE SURE YOU:   Understand these instructions.  Will watch your condition.  Will get help right away if you are not doing well or get worse. Document Released: 01/18/2008 Document Revised: 10/24/2011 Document Reviewed: 12/31/2010 [facility] Patient Information 2015 [employer] [place]. This information is not intended to replace advice given to you by your health care provider. Make sure you discuss any questions you have with your health care provider. You're given IV fluids and IV Ativan for your symptoms.  You had an extended period of time with no nausea, vomiting, at time of discharge.  He stated that you are feeling better.  Please make an appointment with your primary care physician for follow-up

## 2015-01-14 NOTE — ED Notes (Signed)
Pt continues to rest comfortable.  NAD.  Pt very sleepy.  Some difficulty in rousing, but completely alert and oriented when woken.  Pt sts "I don't know why I feel sleepy".

## 2015-01-14 NOTE — ED Notes (Signed)
Pt resting on EMS stretcher at time of arrival, with eyes closed, no acute distress

## 2015-01-14 NOTE — ED Notes (Signed)
Pt denies any complaints at this time.  Still resting with eyes closed in room.  This RN had to sit pt up and verbally stimulate him in order to assess pain.

## 2015-01-14 NOTE — ED Notes (Signed)
Pt requested to be taken to the restroom with wheelchair. After checking on pt, pt found to be "resting" on the floor, states he "got tired. Pt assisted back into the wheelchair and placed back into triage room.

## 2015-01-14 NOTE — Discharge Instructions (Signed)

## 2015-01-15 ENCOUNTER — Emergency Department (HOSPITAL_COMMUNITY): Payer: Self-pay

## 2015-01-15 ENCOUNTER — Inpatient Hospital Stay (HOSPITAL_COMMUNITY)
Admission: EM | Admit: 2015-01-15 | Discharge: 2015-01-18 | DRG: 392 | Disposition: A | Discharge status: Home / Self Care | Payer: Self-pay | Attending: Internal Medicine | Admitting: Internal Medicine

## 2015-01-15 ENCOUNTER — Encounter (HOSPITAL_COMMUNITY): Payer: Self-pay

## 2015-01-15 DIAGNOSIS — R1084 Generalized abdominal pain: Secondary | ICD-10-CM

## 2015-01-15 DIAGNOSIS — Z9049 Acquired absence of other specified parts of digestive tract: Secondary | ICD-10-CM | POA: Diagnosis present

## 2015-01-15 DIAGNOSIS — K2970 Gastritis, unspecified, without bleeding: Secondary | ICD-10-CM | POA: Diagnosis present

## 2015-01-15 DIAGNOSIS — R1110 Vomiting, unspecified: Secondary | ICD-10-CM | POA: Diagnosis present

## 2015-01-15 DIAGNOSIS — E860 Dehydration: Secondary | ICD-10-CM | POA: Diagnosis present

## 2015-01-15 DIAGNOSIS — R112 Nausea with vomiting, unspecified: Secondary | ICD-10-CM | POA: Diagnosis present

## 2015-01-15 DIAGNOSIS — R109 Unspecified abdominal pain: Secondary | ICD-10-CM | POA: Diagnosis present

## 2015-01-15 DIAGNOSIS — K3184 Gastroparesis: Principal | ICD-10-CM | POA: Diagnosis present

## 2015-01-15 DIAGNOSIS — G43A1 Cyclical vomiting, intractable: Secondary | ICD-10-CM | POA: Diagnosis present

## 2015-01-15 DIAGNOSIS — K5900 Constipation, unspecified: Secondary | ICD-10-CM | POA: Diagnosis present

## 2015-01-15 DIAGNOSIS — F1220 Cannabis dependence, uncomplicated: Secondary | ICD-10-CM | POA: Diagnosis present

## 2015-01-15 DIAGNOSIS — G43A0 Cyclical vomiting, not intractable: Secondary | ICD-10-CM

## 2015-01-15 DIAGNOSIS — D72829 Elevated white blood cell count, unspecified: Secondary | ICD-10-CM | POA: Diagnosis present

## 2015-01-15 DIAGNOSIS — J45909 Unspecified asthma, uncomplicated: Secondary | ICD-10-CM | POA: Diagnosis present

## 2015-01-15 DIAGNOSIS — Z833 Family history of diabetes mellitus: Secondary | ICD-10-CM

## 2015-01-15 DIAGNOSIS — Z79899 Other long term (current) drug therapy: Secondary | ICD-10-CM

## 2015-01-15 DIAGNOSIS — Z8249 Family history of ischemic heart disease and other diseases of the circulatory system: Secondary | ICD-10-CM

## 2015-01-15 DIAGNOSIS — R1115 Cyclical vomiting syndrome unrelated to migraine: Secondary | ICD-10-CM | POA: Diagnosis present

## 2015-01-15 LAB — CBC
HCT: 37.3 % — ABNORMAL LOW (ref 39.0–52.0)
Hemoglobin: 12.3 g/dL — ABNORMAL LOW (ref 13.0–17.0)
MCH: 31.7 pg (ref 26.0–34.0)
MCHC: 33 g/dL (ref 30.0–36.0)
MCV: 96.1 fL (ref 78.0–100.0)
Platelets: 255 10*3/uL (ref 150–400)
RBC: 3.88 MIL/uL — ABNORMAL LOW (ref 4.22–5.81)
RDW: 12.4 % (ref 11.5–15.5)
WBC: 11 10*3/uL — ABNORMAL HIGH (ref 4.0–10.5)

## 2015-01-15 LAB — URINALYSIS, ROUTINE W REFLEX MICROSCOPIC
Bilirubin Urine: NEGATIVE
Glucose, UA: NEGATIVE mg/dL
Ketones, ur: 15 mg/dL — AB
Leukocytes, UA: NEGATIVE
Nitrite: NEGATIVE
Protein, ur: NEGATIVE mg/dL
Specific Gravity, Urine: 1.037 — ABNORMAL HIGH (ref 1.005–1.030)
Urobilinogen, UA: 1 mg/dL (ref 0.0–1.0)
pH: 6 (ref 5.0–8.0)

## 2015-01-15 LAB — CBC WITH DIFFERENTIAL/PLATELET
Basophils Absolute: 0 10*3/uL (ref 0.0–0.1)
Basophils Relative: 0 % (ref 0–1)
Eosinophils Absolute: 0 10*3/uL (ref 0.0–0.7)
Eosinophils Relative: 0 % (ref 0–5)
HCT: 37.8 % — ABNORMAL LOW (ref 39.0–52.0)
Hemoglobin: 12.5 g/dL — ABNORMAL LOW (ref 13.0–17.0)
Lymphocytes Relative: 8 % — ABNORMAL LOW (ref 12–46)
Lymphs Abs: 1.1 10*3/uL (ref 0.7–4.0)
MCH: 31.3 pg (ref 26.0–34.0)
MCHC: 33.1 g/dL (ref 30.0–36.0)
MCV: 94.7 fL (ref 78.0–100.0)
Monocytes Absolute: 0.8 10*3/uL (ref 0.1–1.0)
Monocytes Relative: 6 % (ref 3–12)
Neutro Abs: 12.1 10*3/uL — ABNORMAL HIGH (ref 1.7–7.7)
Neutrophils Relative %: 86 % — ABNORMAL HIGH (ref 43–77)
Platelets: 255 10*3/uL (ref 150–400)
RBC: 3.99 MIL/uL — ABNORMAL LOW (ref 4.22–5.81)
RDW: 12.4 % (ref 11.5–15.5)
WBC: 14 10*3/uL — ABNORMAL HIGH (ref 4.0–10.5)

## 2015-01-15 LAB — I-STAT TROPONIN, ED: Troponin i, poc: 0 ng/mL (ref 0.00–0.08)

## 2015-01-15 LAB — COMPREHENSIVE METABOLIC PANEL
ALT: 32 U/L (ref 17–63)
AST: 23 U/L (ref 15–41)
Albumin: 4.5 g/dL (ref 3.5–5.0)
Alkaline Phosphatase: 63 U/L (ref 38–126)
Anion gap: 14 (ref 5–15)
BUN: 18 mg/dL (ref 6–20)
CO2: 25 mmol/L (ref 22–32)
Calcium: 10.1 mg/dL (ref 8.9–10.3)
Chloride: 102 mmol/L (ref 101–111)
Creatinine, Ser: 0.97 mg/dL (ref 0.61–1.24)
GFR calc Af Amer: >60 mL/min (ref >60)
GFR calc non Af Amer: >60 mL/min (ref >60)
Glucose, Bld: 115 mg/dL — ABNORMAL HIGH (ref 65–99)
Potassium: 3.7 mmol/L (ref 3.5–5.1)
Sodium: 141 mmol/L (ref 135–145)
Total Bilirubin: 2.3 mg/dL — ABNORMAL HIGH (ref 0.3–1.2)
Total Protein: 7.9 g/dL (ref 6.5–8.1)

## 2015-01-15 LAB — RAPID URINE DRUG SCREEN, HOSP PERFORMED
Amphetamines: NOT DETECTED
Barbiturates: NOT DETECTED
Benzodiazepines: NOT DETECTED
Cocaine: NOT DETECTED
Opiates: NOT DETECTED
Tetrahydrocannabinol: POSITIVE — AB

## 2015-01-15 LAB — URINE MICROSCOPIC-ADD ON

## 2015-01-15 LAB — CREATININE, SERUM
Creatinine, Ser: 0.93 mg/dL (ref 0.61–1.24)
GFR calc Af Amer: >60 mL/min (ref >60)
GFR calc non Af Amer: >60 mL/min (ref >60)

## 2015-01-15 LAB — TSH: TSH: 0.606 u[IU]/mL (ref 0.350–4.500)

## 2015-01-15 LAB — LIPASE, BLOOD: Lipase: 14 U/L — ABNORMAL LOW (ref 22–51)

## 2015-01-15 MED ORDER — HYDROCODONE-ACETAMINOPHEN 5-325 MG PO TABS: 1.0000 | ORAL_TABLET | ORAL | Status: DC | PRN

## 2015-01-15 MED ORDER — METOCLOPRAMIDE HCL 5 MG/ML IJ SOLN
10.0000 mg | Freq: Four times a day (QID) | INTRAMUSCULAR | Status: DC
  Administered 2015-01-15 – 2015-01-17 (×8): 10 mg via INTRAVENOUS
  Filled 2015-01-15 (×11): qty 2

## 2015-01-15 MED ORDER — ACETAMINOPHEN 325 MG PO TABS: 650.0000 mg | ORAL_TABLET | Freq: Four times a day (QID) | ORAL | Status: DC | PRN

## 2015-01-15 MED ORDER — HEPARIN SODIUM (PORCINE) 5000 UNIT/ML IJ SOLN
5000.0000 [IU] | Freq: Three times a day (TID) | INTRAMUSCULAR | Status: DC
  Administered 2015-01-15 – 2015-01-18 (×8): 5000 [IU] via SUBCUTANEOUS
  Filled 2015-01-15 (×11): qty 1

## 2015-01-15 MED ORDER — SODIUM CHLORIDE 0.9 % IV BOLUS (SEPSIS)
1000.0000 mL | Freq: Once | INTRAVENOUS | Status: AC
  Administered 2015-01-15: 1000 mL via INTRAVENOUS

## 2015-01-15 MED ORDER — KETOROLAC TROMETHAMINE 30 MG/ML IJ SOLN
30.0000 mg | Freq: Once | INTRAMUSCULAR | Status: AC
  Administered 2015-01-15: 30 mg via INTRAVENOUS
  Filled 2015-01-15: qty 1

## 2015-01-15 MED ORDER — ONDANSETRON HCL 4 MG/2ML IJ SOLN
4.0000 mg | Freq: Four times a day (QID) | INTRAMUSCULAR | Status: AC
  Administered 2015-01-15 – 2015-01-16 (×4): 4 mg via INTRAVENOUS
  Filled 2015-01-15 (×4): qty 2

## 2015-01-15 MED ORDER — PROMETHAZINE HCL 25 MG/ML IJ SOLN
25.0000 mg | Freq: Four times a day (QID) | INTRAMUSCULAR | Status: DC | PRN
  Administered 2015-01-15 – 2015-01-16 (×2): 25 mg via INTRAVENOUS
  Filled 2015-01-15 (×2): qty 1

## 2015-01-15 MED ORDER — SODIUM CHLORIDE 0.9 % IV SOLN: INTRAVENOUS | Status: DC

## 2015-01-15 MED ORDER — HYDROMORPHONE HCL 1 MG/ML IJ SOLN: 1.0000 mg | INTRAMUSCULAR | Status: DC | PRN

## 2015-01-15 MED ORDER — SODIUM CHLORIDE 0.9 % IV SOLN
INTRAVENOUS | Status: DC
  Administered 2015-01-15 – 2015-01-16 (×2): via INTRAVENOUS

## 2015-01-15 MED ORDER — ONDANSETRON HCL 4 MG/2ML IJ SOLN: 4.0000 mg | Freq: Three times a day (TID) | INTRAMUSCULAR | Status: DC | PRN

## 2015-01-15 MED ORDER — HYDROMORPHONE HCL 1 MG/ML IJ SOLN
1.0000 mg | INTRAMUSCULAR | Status: DC | PRN
  Administered 2015-01-15 – 2015-01-18 (×6): 1 mg via INTRAVENOUS
  Filled 2015-01-15 (×6): qty 1

## 2015-01-15 MED ORDER — ACETAMINOPHEN 650 MG RE SUPP: 650.0000 mg | Freq: Four times a day (QID) | RECTAL | Status: DC | PRN

## 2015-01-15 MED ORDER — ONDANSETRON HCL 4 MG/2ML IJ SOLN
4.0000 mg | Freq: Once | INTRAMUSCULAR | Status: AC
  Administered 2015-01-15: 4 mg via INTRAVENOUS
  Filled 2015-01-15: qty 2

## 2015-01-15 MED ORDER — PROMETHAZINE HCL 25 MG/ML IJ SOLN
6.2500 mg | Freq: Once | INTRAMUSCULAR | Status: DC
  Filled 2015-01-15: qty 1

## 2015-01-15 NOTE — H&P (Signed)
History and Physical        Hospital Admission Note Date: 01/15/2015  Patient name: Matthew Scott Medical record number: [id number] Date of birth: [birth year] Age: 34 y.o. Gender: male  PCP: No PCP Per Patient  Referring physician: Dr Judd Lien  Chief Complaint:  Intractable nausea and vomiting for last 4 days  HPI: Patient is a 34 year old male with known history of long-standing gastroparesis, cyclical vomiting syndrome, excessive marijuana use presented to ED with intractable nausea and vomiting for the last 4 days. This is his third ER visit in the last 3 days (5/31, 6/1, 6/2, with the same symptoms. Patient was treated in ED with IV fluids, antiemetics. Patient reports that for the last 4 days he's been having multiple episodes of vomiting, cannot hold anything down. He also reported intermittent abdominal discomfort in the epigastric region. He has known history of gastritis. Patient reported that he has been taking Prilosec and Reglan however had not taken Phenergan or Zofran. Patient reports that for the last 2 weeks he has been doing excessive marijuana, smoking at least 4-5 joints of marijuana every single day for last 2-3 weeks, however he has not smoked marijuana in the last 3 days. He denies any diarrhea, hematochezia or melenic or hematemesis. He describes abdominal discomfort as pain, intermittent, and epigastric region, 8/ 10, associated with nausea and vomiting. Given his recurrent episodes of nausea and vomiting, hospitalist service requested for admission  Review of Systems:  Constitutional: Denies fever, chills, diaphoresis, poor appetite and fatigue.  HEENT: Denies photophobia, eye pain, redness, hearing loss, ear pain, congestion, sore throat, rhinorrhea, sneezing, mouth sores, trouble swallowing, neck pain, neck stiffness and tinnitus.   Respiratory: Denies SOB,  DOE, cough, chest tightness,  and wheezing.   Cardiovascular: Denies chest pain, palpitations and leg swelling.  Gastrointestinal: Please see history of present illness  Genitourinary: Denies dysuria, urgency, frequency, hematuria, flank pain and difficulty urinating.  Musculoskeletal: Denies myalgias, back pain, joint swelling, arthralgias and gait problem.  Skin: Denies pallor, rash and wound.  Neurological: Denies dizziness, seizures, syncope, weakness, light-headedness, numbness and headaches.  Hematological: Denies adenopathy. Easy bruising, personal or family bleeding history  Psychiatric/Behavioral: Denies suicidal ideation, mood changes, confusion, nervousness, sleep disturbance and agitation  Past Medical History: Past Medical History  Diagnosis Date  . Asthma   . Chronic abdominal pain   . Nausea and vomiting     chronic, recurrent  . Polysubstance abuse   . Gastroparesis   . 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 GASTRITIS 12/09/2009    Qualifier: Diagnosis of  By: Daphine Deutscher FNP, Zena Amos      Past Surgical History  Procedure Laterality Date  . Cholecystectomy  11/2009    Medications: Prior to Admission medications   Medication Sig Start Date End Date Taking? Authorizing Provider  omeprazole (PRILOSEC) 40 MG capsule Take 1 capsule (40 mg total) by mouth daily. 12/19/14  Yes Jaclyn Shaggy, MD  ondansetron (ZOFRAN ODT) 4 MG disintegrating tablet Take 1 tablet (4 mg total)  by mouth every 8 (eight) hours as needed for nausea. 01/14/15  Yes Eber Hong, MD  promethazine (PHENERGAN) 25 MG tablet Take 1 tablet (25 mg total) by mouth every 6 (six) hours as needed for nausea or vomiting. 01/14/15  Yes Eber Hong, MD  ibuprofen (ADVIL,MOTRIN) 400 MG tablet Take 1 tablet (400 mg total) by mouth every 6 (six) hours as needed. Patient not taking:  Reported on 01/13/2015 12/12/14   Joycie Peek, PA-C  metoCLOPramide (REGLAN) 10 MG tablet Take 1 tablet (10 mg total) by mouth every 8 (eight) hours as needed for nausea. Patient not taking: Reported on 01/15/2015 01/06/15   Jaclyn Shaggy, MD  promethazine (PHENERGAN) 25 MG suppository Place 1 suppository (25 mg total) rectally every 6 (six) hours as needed for nausea or vomiting. Patient not taking: Reported on 01/15/2015 01/14/15   Eber Hong, MD    Allergies:  No Known Allergies  Social History:  reports that he quit smoking about 5 weeks ago. His smoking use included Cigars. He has never used smokeless tobacco. He reports that he drinks alcohol. He reports that he uses illicit drugs (Marijuana).  Family History: Family History  Problem Relation Age of Onset  . Other Father   . Diabetes Father   . Hypertension Father     Physical Exam: Blood pressure 163/84, pulse 52, temperature 98.8 F (37.1 C), temperature source Oral, resp. rate 18, SpO2 100 %. General: Alert, awake, oriented x3, in no acute distress. HEENT: normocephalic, atraumatic, anicteric sclera, pink conjunctiva, pupils equal and reactive to light and accomodation, oropharynx clear Neck: supple, no masses or lymphadenopathy, no goiter, no bruits  Heart: Regular rate and rhythm, without murmurs, rubs or gallops. Lungs: Clear to auscultation bilaterally, no wheezing, rales or rhonchi. Abdomen: Soft, mild tenderness in the epigastric region, no rebound or guarding , nondistended, positive bowel sounds, no masses. Extremities: No clubbing, cyanosis or edema with positive pedal pulses. Neuro: Grossly intact, no focal neurological deficits, strength 5/5 upper and lower extremities bilaterally Psych: alert and oriented x 3, normal mood and affect Skin: no rashes or lesions, warm and dry   LABS on Admission:  Basic Metabolic Panel:  Recent Labs Lab 01/14/15 1830 01/15/15 1210  NA 138 141  K 3.9 3.7  CL 102 102  CO2 23  25  GLUCOSE 119* 115*  BUN 13 18  CREATININE 1.06 0.97  CALCIUM 10.0 10.1   Liver Function Tests:  Recent Labs Lab 01/14/15 1830 01/15/15 1210  AST 23 23  ALT 36 32  ALKPHOS 66 63  BILITOT 1.7* 2.3*  PROT 8.2* 7.9  ALBUMIN 4.5 4.5    Recent Labs Lab 01/15/15 1210  LIPASE 14*   No results for input(s): AMMONIA in the last 168 hours. CBC:  Recent Labs Lab 01/14/15 1830 01/15/15 1210  WBC 15.1* 14.0*  NEUTROABS  --  12.1*  HGB 13.4 12.5*  HCT 39.4 37.8*  MCV 93.8 94.7  PLT 273 255   Cardiac Enzymes: No results for input(s): CKTOTAL, CKMB, CKMBINDEX, TROPONINI in the last 168 hours. BNP: Invalid input(s): POCBNP CBG: No results for input(s): GLUCAP in the last 168 hours.  Radiological Exams on Admission:  Dg Abd Acute W/chest  01/15/2015   CLINICAL DATA:  Acute generalized abdominal pain.  EXAM: DG ABDOMEN ACUTE W/ 1V CHEST  COMPARISON:  December 05, 2014.  FINDINGS: There is no evidence of dilated bowel loops or free intraperitoneal air. No radiopaque calculi or other significant radiographic abnormality is seen. Heart size  and mediastinal contours are within normal limits. Both lungs are clear. Status post cholecystectomy.  IMPRESSION: No evidence of bowel obstruction or ileus. No acute cardiopulmonary disease.   Electronically Signed   By: Lupita Raider, M.D.   On: 01/15/2015 13:42    *I have personally reviewed the images above*     Assessment/Plan Principal Problem:   Nausea & vomiting, intractable with abdominal pain: Likely due to long-standing history of gastroparesis, cyclical vomiting syndrome and cannabis hyperemesis syndrome. Abdominal x-ray negative for any small bowel obstruction or ileus.  - Placed on NPO status, aggressive IV fluid hydration, scheduled Zofran and Reglan - Placed on IV PPI - If no significant improvement, will repeat CT abdomen and pelvis, may need GI consult. CT abdomen and pelvis on 5/3 was unremarkable.  Active Problems:    Abdominal pain, Gastroparesis, GERD: Known long-standing history, nuclear medicine gastric imaging study positive in 2012 - Patient has not been taking Reglan and Zofran at home - Placed on IV PPI, GI consult if symptoms are not improving    Intractable vomiting secondary to cannabis hyperemesis syndrome, severe THC dependence - Patient counseled strongly against marijuana use  Dehydration, leukocytosis - Placed on IV fluid hydration - UA positive for ketones, no UTI - Chest x-ray negative for pneumonia   DVT prophylaxis: Lovenox  CODE STATUS: full code  Family Communication: Admission, patients condition and plan of care including tests being ordered have been discussed with the patient  who indicates understanding and agree with the plan and Code Status  Disposition plan: Further plan will depend as patient's clinical course evolves and further radiologic and laboratory data become available. Likely home when stable  Time Spent on Admission: 60 mins   Delara Shepheard M.D. [facility] 01/15/2015, 3:21 PM Pager: [phone]  If 7PM-7AM, please contact night-coverage [email or web address] Password [email or web address]

## 2015-01-15 NOTE — ED Notes (Signed)
Baker PieriniSamuel Friede - Father  [phone]  Call when pt up for D/C

## 2015-01-15 NOTE — ED Notes (Signed)
Pt vomiting moderate amount of emesis into bag. Alerted MD, will give phenergan IV as soon as it comes from pharmacy

## 2015-01-15 NOTE — ED Notes (Signed)
Per [facility], Pt, from home, c/o R abdominal pain radiating into L abdomen, emesis, and constipation x 4 days.  Pain score 10/10.  Pt has been seen the past 2 days for same.  Pt reports taking Prilosec and Reglan as prescribed, but could not afford phenergan or zofran ODT.  Hx of gastroparesis and H Pylori.

## 2015-01-15 NOTE — ED Provider Notes (Signed)
CSN: [id number]     Arrival date & time 01/15/15  1139 History   First MD Initiated Contact with Patient 01/15/15 1217     Chief Complaint  Patient presents with  . Emesis  . Constipation  . Abdominal Pain     (Consider location/radiation/quality/duration/timing/severity/associated sxs/prior Treatment) HPI Comments: Patient is a 33 year old male with history of gastroparesis, recurrent abdominal pain, and cyclic vomiting thought to be related to excessive marijuana use. He presents today for evaluation of severe abdominal pain, nausea, and vomiting for the past 3 days. This is his third trip to the ER for the same in the past 3 days. He was medicated, hydrated, and discharged on the prior 2 visits. He denies fevers or chills. He denies any bloody vomit. He reports severe pain in the epigastric region. His father who is present at bedside provides the majority of the history and reports that the patient has continued to smoke marijuana.  Patient is a 34 y.o. male presenting with vomiting, constipation, and abdominal pain. The history is provided by the patient.  Emesis Severity:  Severe Duration:  3 days Timing:  Constant Progression:  Unchanged Chronicity:  Recurrent Relieved by:  Nothing Worsened by:  Nothing tried Ineffective treatments:  None tried Associated symptoms: abdominal pain   Constipation Associated symptoms: abdominal pain and vomiting   Abdominal Pain Associated symptoms: constipation and vomiting     Past Medical History  Diagnosis Date  . Asthma   . Chronic abdominal pain   . Nausea and vomiting     chronic, recurrent  . Polysubstance abuse   . Gastroparesis   . 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 GASTRITIS 12/09/2009    Qualifier: Diagnosis of  By: Daphine Deutscher FNP, Zena Amos     Past Surgical History  Procedure  Laterality Date  . Cholecystectomy  11/2009   Family History  Problem Relation Age of Onset  . Other Father   . Diabetes Father   . Hypertension Father    History  Substance Use Topics  . Smoking status: Former Smoker -- 8 years    Types: Cigars    Quit date: 12/05/2014  . Smokeless tobacco: Never Used  . Alcohol Use: 0.0 oz/week    0 Standard drinks or equivalent per week     Comment: 12/16/2014 " I no Longer drink ,I quit about 2 years ago "    Review of Systems  Gastrointestinal: Positive for vomiting, abdominal pain and constipation.  All other systems reviewed and are negative.     Allergies  Review of patient's allergies indicates no known allergies.  Home Medications   Prior to Admission medications   Medication Sig Start Date End Date Taking? Authorizing Provider  ibuprofen (ADVIL,MOTRIN) 400 MG tablet Take 1 tablet (400 mg total) by mouth every 6 (six) hours as needed. Patient not taking: Reported on 01/13/2015 12/12/14   Joycie Peek, PA-C  metoCLOPramide (REGLAN) 10 MG tablet Take 1 tablet (10 mg total) by mouth every 8 (eight) hours as needed for nausea. 01/06/15   Jaclyn Shaggy, MD  omeprazole (PRILOSEC) 40 MG capsule Take 1 capsule (40 mg total) by mouth daily. 12/19/14   Jaclyn Shaggy, MD  ondansetron (ZOFRAN ODT) 4 MG disintegrating tablet Take 1 tablet (4 mg total) by mouth every 8 (eight) hours as needed for nausea. 01/14/15  Eber HongBrian Miller, MD  promethazine (PHENERGAN) 25 MG suppository Place 1 suppository (25 mg total) rectally every 6 (six) hours as needed for nausea or vomiting. 01/14/15   Eber HongBrian Miller, MD  promethazine (PHENERGAN) 25 MG tablet Take 1 tablet (25 mg total) by mouth every 6 (six) hours as needed for nausea or vomiting. 01/14/15   Eber HongBrian Miller, MD   BP 159/90 mmHg  Pulse 52  Temp(Src) 99 F (37.2 C) (Oral)  Resp 22  SpO2 98% Physical Exam  Constitutional: He is oriented to person, place, and time. He appears well-developed and well-nourished.  No distress.  HENT:  Head: Normocephalic and atraumatic.  Mouth/Throat: Oropharynx is clear and moist.  Neck: Normal range of motion. Neck supple.  Cardiovascular: Normal rate, regular rhythm and normal heart sounds.   No murmur heard. Pulmonary/Chest: Effort normal and breath sounds normal. No respiratory distress. He has no wheezes.  Abdominal: Soft. Bowel sounds are normal. He exhibits no distension. There is no tenderness. There is no rebound.  Musculoskeletal: Normal range of motion. He exhibits no edema.  Neurological: He is alert and oriented to person, place, and time.  Skin: Skin is warm and dry. He is not diaphoretic.  Nursing note and vitals reviewed.   ED Course  Procedures (including critical care time) Labs Review Labs Reviewed  CBC WITH DIFFERENTIAL/PLATELET  COMPREHENSIVE METABOLIC PANEL  LIPASE, BLOOD  I-STAT TROPOININ, ED    Imaging Review No results found.   EKG Interpretation None      MDM   Final diagnoses:  Abdominal pain    Patient is a 34 year old male with history of gastroparesis/cyclic vomiting who presents for the fourth time in the last few days for vomiting. He was given anti-medics and fluids and continues to vomit. His laboratory studies are otherwise unremarkable. I will admit the patient to the hospitalist service under the care of Dr. Isidoro Donningai.    Geoffery Lyonsouglas Kristien Salatino, MD 01/15/15 [phone]

## 2015-01-15 NOTE — ED Notes (Signed)
Bed: [id number] Expected date:  Expected time:  Means of arrival:  Comments: EMS 41M abd pain, emesis, constipation x4 days, L flank pain

## 2015-01-16 DIAGNOSIS — F1220 Cannabis dependence, uncomplicated: Secondary | ICD-10-CM

## 2015-01-16 DIAGNOSIS — R112 Nausea with vomiting, unspecified: Secondary | ICD-10-CM

## 2015-01-16 LAB — URINE CULTURE
Colony Count: NO GROWTH
Culture: NO GROWTH

## 2015-01-16 LAB — COMPREHENSIVE METABOLIC PANEL
ALT: 25 U/L (ref 17–63)
AST: 19 U/L (ref 15–41)
Albumin: 3.5 g/dL (ref 3.5–5.0)
Alkaline Phosphatase: 51 U/L (ref 38–126)
Anion gap: 8 (ref 5–15)
BUN: 18 mg/dL (ref 6–20)
CO2: 26 mmol/L (ref 22–32)
Calcium: 9.3 mg/dL (ref 8.9–10.3)
Chloride: 106 mmol/L (ref 101–111)
Creatinine, Ser: 1 mg/dL (ref 0.61–1.24)
GFR calc Af Amer: >60 mL/min (ref >60)
GFR calc non Af Amer: >60 mL/min (ref >60)
Glucose, Bld: 94 mg/dL (ref 65–99)
Potassium: 3.5 mmol/L (ref 3.5–5.1)
Sodium: 140 mmol/L (ref 135–145)
Total Bilirubin: 2.9 mg/dL — ABNORMAL HIGH (ref 0.3–1.2)
Total Protein: 6.4 g/dL — ABNORMAL LOW (ref 6.5–8.1)

## 2015-01-16 LAB — CBC
HCT: 36 % — ABNORMAL LOW (ref 39.0–52.0)
Hemoglobin: 11.6 g/dL — ABNORMAL LOW (ref 13.0–17.0)
MCH: 31.2 pg (ref 26.0–34.0)
MCHC: 32.2 g/dL (ref 30.0–36.0)
MCV: 96.8 fL (ref 78.0–100.0)
Platelets: 256 10*3/uL (ref 150–400)
RBC: 3.72 MIL/uL — ABNORMAL LOW (ref 4.22–5.81)
RDW: 12.3 % (ref 11.5–15.5)
WBC: 9 10*3/uL (ref 4.0–10.5)

## 2015-01-16 LAB — HEMOGLOBIN A1C
Hgb A1c MFr Bld: 5.1 % (ref 4.8–5.6)
Mean Plasma Glucose: 100 mg/dL

## 2015-01-16 MED ORDER — PROMETHAZINE HCL 25 MG/ML IJ SOLN: 6.2500 mg | Freq: Four times a day (QID) | INTRAMUSCULAR | Status: DC | PRN

## 2015-01-16 MED ORDER — FLEET ENEMA 7-19 GM/118ML RE ENEM: 1.0000 | ENEMA | Freq: Every day | RECTAL | Status: DC | PRN

## 2015-01-16 MED ORDER — PANTOPRAZOLE SODIUM 40 MG IV SOLR
40.0000 mg | INTRAVENOUS | Status: DC
  Administered 2015-01-16 – 2015-01-17 (×2): 40 mg via INTRAVENOUS
  Filled 2015-01-16 (×3): qty 40

## 2015-01-16 MED ORDER — PROMETHAZINE HCL 25 MG/ML IJ SOLN: 12.5000 mg | Freq: Four times a day (QID) | INTRAMUSCULAR | Status: DC | PRN

## 2015-01-16 MED ORDER — LORAZEPAM 2 MG/ML IJ SOLN
0.5000 mg | Freq: Four times a day (QID) | INTRAMUSCULAR | Status: DC | PRN
Start: 2015-01-16 — End: 2015-01-18
  Administered 2015-01-17: 0.5 mg via INTRAVENOUS
  Filled 2015-01-16: qty 1

## 2015-01-16 MED ORDER — BISACODYL 10 MG RE SUPP: 10.0000 mg | Freq: Every day | RECTAL | Status: DC | PRN

## 2015-01-16 MED ORDER — PROMETHAZINE HCL 25 MG/ML IJ SOLN
6.2500 mg | Freq: Four times a day (QID) | INTRAMUSCULAR | Status: DC | PRN
  Administered 2015-01-18: 6.25 mg via INTRAVENOUS
  Filled 2015-01-16: qty 1

## 2015-01-16 MED ORDER — SODIUM CHLORIDE 0.9 % IV SOLN
INTRAVENOUS | Status: DC
  Administered 2015-01-16 – 2015-01-17 (×2): via INTRAVENOUS
  Filled 2015-01-16 (×7): qty 1000

## 2015-01-16 MED ORDER — POLYETHYLENE GLYCOL 3350 17 G PO PACK
17.0000 g | PACK | Freq: Every day | ORAL | Status: DC
  Administered 2015-01-16: 17 g via ORAL
  Filled 2015-01-16 (×3): qty 1

## 2015-01-16 MED ORDER — SORBITOL 70 % SOLN
30.0000 mL | Freq: Once | Status: AC
  Administered 2015-01-16: 30 mL via ORAL
  Filled 2015-01-16: qty 30

## 2015-01-16 MED ORDER — SENNOSIDES-DOCUSATE SODIUM 8.6-50 MG PO TABS
2.0000 | ORAL_TABLET | Freq: Every day | ORAL | Status: DC
  Administered 2015-01-16 – 2015-01-17 (×2): 2 via ORAL
  Filled 2015-01-16 (×3): qty 2

## 2015-01-16 NOTE — Progress Notes (Signed)
PATIENT DETAILS Name: Matthew Scott Age: 34 y.o. Sex: male Date of Birth: 1980/09/23 Admit Date: 01/15/2015 Admitting Physician Ripudeep Jenna Luo, MD PCP:No PCP Per Patient  Subjective: Claims to have some intermittent abdominal pain. Continues to have nausea and one episode of vomiting this morning. Last bowel movement was 5 days back.  Assessment/Plan: Principal Problem:   Nausea & vomiting:: Multifactorial-likely due to gastroparesis, ongoing marijuana use and perhaps cyclical vomiting syndrome. Abdomen is very soft, hard to any tenderness during my exam. Continue with antiemetics, add IV Ativan. Add bowel regimen. Keep nothing by mouth today, will see if we can start liquids tomorrow.  Active Problems:   Severe tetrahydrocannabinol (THC) dependence:counseled extensively  Disposition: Remain inpatient  Antimicrobial agents  See below  Anti-infectives    None      DVT Prophylaxis: Prophylactic Heparin   Code Status: Full code  Family Communication Father at bedside-spoke with him-ONLY after patient gave me permission  Procedures: None  CONSULTS:  None  Time spent 25 minutes-Greater than 50% of this time was spent in counseling, explanation of diagnosis, planning of further management, and coordination of care.  MEDICATIONS: Scheduled Meds: . sodium chloride   Intravenous STAT  . heparin  5,000 Units Subcutaneous 3 times per day  . metoCLOPramide (REGLAN) injection  10 mg Intravenous 4 times per day  . pantoprazole (PROTONIX) IV  40 mg Intravenous Q24H  . polyethylene glycol  17 g Oral Daily  . promethazine  6.25 mg Intravenous Once  . senna-docusate  2 tablet Oral QHS   Continuous Infusions: . sodium chloride 125 mL/hr at 01/16/15 0635   PRN Meds:.acetaminophen **OR** acetaminophen, bisacodyl, HYDROcodone-acetaminophen, HYDROmorphone (DILAUDID) injection, LORazepam, promethazine, sodium phosphate    PHYSICAL EXAM: Vital signs in last  24 hours: Filed Vitals:   01/15/15 1528 01/15/15 1615 01/15/15 2014 01/16/15 0539  BP: 146/76 157/89 136/60 131/65  Pulse: 67 53 70 62  Temp: 98.2 F (36.8 C) 99 F (37.2 C) 98.3 F (36.8 C) 98.3 F (36.8 C)  TempSrc: Oral Oral Oral Oral  Resp: 15 16 16 16   Height:  5\' 9"  (1.753 m)    Weight:  83.915 kg (185 lb)    SpO2: 100% 100% 98% 98%    Weight change:  Filed Weights   01/15/15 1615  Weight: 83.915 kg (185 lb)   Body mass index is 27.31 kg/(m^2).   Gen Exam: Awake and alert with clear speech.  Neck: Supple, No JVD.   Chest: B/L Clear.   CVS: S1 S2 Regular, no murmurs.  Abdomen: soft, BS +, non tender, non distended.  Extremities: no edema, lower extremities warm to touch. Neurologic: Non Focal.   Skin: No Rash.   Wounds: N/A.   Intake/Output from previous day:  Intake/Output Summary (Last 24 hours) at 01/16/15 1413 Last data filed at 01/16/15 0730  Gross per 24 hour  Intake  687.5 ml  Output    450 ml  Net  237.5 ml     LAB RESULTS: CBC  Recent Labs Lab 01/14/15 0014 01/14/15 1830 01/15/15 1210 01/15/15 1705 01/16/15 0405  WBC 11.3* 15.1* 14.0* 11.0* 9.0  HGB 12.9* 13.4 12.5* 12.3* 11.6*  HCT 39.8 39.4 37.8* 37.3* 36.0*  PLT 282 273 255 255 256  MCV 96.8 93.8 94.7 96.1 96.8  MCH 31.4 31.9 31.3 31.7 31.2  MCHC 32.4 34.0 33.1 33.0 32.2  RDW 12.2 12.4 12.4 12.4 12.3  LYMPHSABS 1.5  --  1.1  --   --   MONOABS 0.7  --  0.8  --   --   EOSABS 0.0  --  0.0  --   --   BASOSABS 0.0  --  0.0  --   --     Chemistries   Recent Labs Lab 01/14/15 0014 01/14/15 1830 01/15/15 1210 01/15/15 1705 01/16/15 0405  NA 141 138 141  --  140  K 3.3* 3.9 3.7  --  3.5  CL 104 102 102  --  106  CO2 25 23 25   --  26  GLUCOSE 162* 119* 115*  --  94  BUN 13 13 18   --  18  CREATININE 0.87 1.06 0.97 0.93 1.00  CALCIUM 10.0 10.0 10.1  --  9.3    CBG: No results for input(s): GLUCAP in the last 168 hours.  GFR Estimated Creatinine Clearance: 103.1 mL/min  (by C-G formula based on Cr of 1).  Coagulation profile No results for input(s): INR, PROTIME in the last 168 hours.  Cardiac Enzymes No results for input(s): CKMB, TROPONINI, MYOGLOBIN in the last 168 hours.  Invalid input(s): CK  Invalid input(s): POCBNP No results for input(s): DDIMER in the last 72 hours.  Recent Labs  01/15/15 1705  HGBA1C 5.1   No results for input(s): CHOL, HDL, LDLCALC, TRIG, CHOLHDL, LDLDIRECT in the last 72 hours.  Recent Labs  01/15/15 1705  TSH 0.606   No results for input(s): VITAMINB12, FOLATE, FERRITIN, TIBC, IRON, RETICCTPCT in the last 72 hours.  Recent Labs  01/15/15 1210  LIPASE 14*    Urine Studies No results for input(s): UHGB, CRYS in the last 72 hours.  Invalid input(s): UACOL, UAPR, USPG, UPH, UTP, UGL, UKET, UBIL, UNIT, UROB, ULEU, UEPI, UWBC, URBC, UBAC, CAST, UCOM, BILUA  MICROBIOLOGY: No results found for this or any previous visit (from the past 240 hour(s)).  RADIOLOGY STUDIES/RESULTS: Dg Abd Acute W/chest  01/15/2015   CLINICAL DATA:  Acute generalized abdominal pain.  EXAM: DG ABDOMEN ACUTE W/ 1V CHEST  COMPARISON:  December 05, 2014.  FINDINGS: There is no evidence of dilated bowel loops or free intraperitoneal air. No radiopaque calculi or other significant radiographic abnormality is seen. Heart size and mediastinal contours are within normal limits. Both lungs are clear. Status post cholecystectomy.  IMPRESSION: No evidence of bowel obstruction or ileus. No acute cardiopulmonary disease.   Electronically Signed   By: Lupita Raider, M.D.   On: 01/15/2015 13:42    Jeoffrey Massed, MD  [facility] Pager:[phone]  If 7PM-7AM, please contact night-coverage [email or web address] Password [email or web address] 01/16/2015, 2:13 PM   LOS: 0 days

## 2015-01-16 NOTE — Plan of Care (Signed)
Problem: Consults Goal: General Medical Patient Education See Patient Education Module for specific education. Outcome: Progressing Educated on Heparin injection. Voiced understanding.

## 2015-01-17 DIAGNOSIS — K3184 Gastroparesis: Principal | ICD-10-CM

## 2015-01-17 DIAGNOSIS — G43A1 Cyclical vomiting, intractable: Secondary | ICD-10-CM

## 2015-01-17 MED ORDER — METOCLOPRAMIDE HCL 5 MG/ML IJ SOLN
10.0000 mg | Freq: Three times a day (TID) | INTRAMUSCULAR | Status: DC
  Filled 2015-01-17 (×3): qty 2

## 2015-01-17 MED ORDER — OXYCODONE HCL 5 MG PO TABS: 5.0000 mg | ORAL_TABLET | Freq: Four times a day (QID) | ORAL | Status: DC | PRN

## 2015-01-17 MED ORDER — ZOLPIDEM TARTRATE 5 MG PO TABS: 5.0000 mg | ORAL_TABLET | Freq: Every evening | ORAL | Status: DC | PRN

## 2015-01-17 NOTE — Progress Notes (Signed)
PATIENT DETAILS Name: Matthew Scott Age: 34 y.o. Sex: male Date of Birth: 05/15/1981 Admit Date: 01/15/2015 Admitting Physician Ripudeep Jenna LuoK Rai, MD PCP:No PCP Per Patient  Subjective: Much better. No nauea/vomting/abd pain   Assessment/Plan: Principal Problem:   Nausea & vomiting:: Multifactorial-likely due to gastroparesis, ongoing marijuana use and perhaps cyclical vomiting syndrome. Abdomen  Soft,with no tenderness during my exam. Had numerous bowel movements yesterday and today. Much improved, advance diet. Continue with antiemetics-but change to prn,IV Ativan prn. If continues to improve suspect home on 6/5  Active Problems:   Severe tetrahydrocannabinol (THC) dependence:counseled extensively     Constipation: Resolved with bowel regimen  Disposition: Remain inpatient-home 6/5  Antimicrobial agents  See below  Anti-infectives    None      DVT Prophylaxis: Prophylactic Heparin   Code Status: Full code  Family Communication None at bedside  Procedures: None  CONSULTS:  None  Time spent 15 minutes-Greater than 50% of this time was spent in counseling, explanation of diagnosis, planning of further management, and coordination of care.  MEDICATIONS: Scheduled Meds: . heparin  5,000 Units Subcutaneous 3 times per day  . metoCLOPramide (REGLAN) injection  10 mg Intravenous 4 times per day  . pantoprazole (PROTONIX) IV  40 mg Intravenous Q24H  . polyethylene glycol  17 g Oral Daily  . promethazine  6.25 mg Intravenous Once  . senna-docusate  2 tablet Oral QHS   Continuous Infusions: . sodium chloride 0.9 % 1,000 mL with potassium chloride 20 mEq infusion 125 mL/hr at 01/17/15 0052   PRN Meds:.acetaminophen **OR** acetaminophen, bisacodyl, HYDROcodone-acetaminophen, HYDROmorphone (DILAUDID) injection, LORazepam, oxyCODONE, promethazine, sodium phosphate, zolpidem    PHYSICAL EXAM: Vital signs in last 24 hours: Filed Vitals:   01/16/15  1447 01/16/15 2042 01/17/15 0653 01/17/15 1335  BP: 126/61 129/71 132/70 143/70  Pulse: 58 53 64 58  Temp: 98.4 F (36.9 C) 98.6 F (37 C) 98.5 F (36.9 C) 98.9 F (37.2 C)  TempSrc: Oral Oral Oral Oral  Resp: 16 16 16 18   Height:      Weight:      SpO2: 96% 100% 97% 99%    Weight change:  Filed Weights   01/15/15 1615  Weight: 83.915 kg (185 lb)   Body mass index is 27.31 kg/(m^2).   Gen Exam: Awake and alert with clear speech.  Neck: Supple, No JVD.   Chest: B/L Clear.  No rales or rhonchi CVS: S1 S2 Regular, no murmurs.  Abdomen: soft, BS +, non tender, non distended.  Extremities: no edema, lower extremities warm to touch. Neurologic: Non Focal.   Skin: No Rash.   Wounds: N/A.   Intake/Output from previous day:  Intake/Output Summary (Last 24 hours) at 01/17/15 1500 Last data filed at 01/17/15 1230  Gross per 24 hour  Intake 1906.25 ml  Output    950 ml  Net 956.25 ml     LAB RESULTS: CBC  Recent Labs Lab 01/14/15 0014 01/14/15 1830 01/15/15 1210 01/15/15 1705 01/16/15 0405  WBC 11.3* 15.1* 14.0* 11.0* 9.0  HGB 12.9* 13.4 12.5* 12.3* 11.6*  HCT 39.8 39.4 37.8* 37.3* 36.0*  PLT 282 273 255 255 256  MCV 96.8 93.8 94.7 96.1 96.8  MCH 31.4 31.9 31.3 31.7 31.2  MCHC 32.4 34.0 33.1 33.0 32.2  RDW 12.2 12.4 12.4 12.4 12.3  LYMPHSABS 1.5  --  1.1  --   --   MONOABS  0.7  --  0.8  --   --   EOSABS 0.0  --  0.0  --   --   BASOSABS 0.0  --  0.0  --   --     Chemistries   Recent Labs Lab 01/14/15 0014 01/14/15 1830 01/15/15 1210 01/15/15 1705 01/16/15 0405  NA 141 138 141  --  140  K 3.3* 3.9 3.7  --  3.5  CL 104 102 102  --  106  CO2 --  26  GLUCOSE 162* 119* 115*  --  94  BUN --  18  CREATININE 0.87 1.06 0.97 0.93 1.00  CALCIUM 10.0 10.0 10.1  --  9.3    CBG: No results for input(s): GLUCAP in the last 168 hours.  GFR Estimated Creatinine Clearance: 103.1 mL/min (by C-G formula based on Cr of 1).  Coagulation  profile No results for input(s): INR, PROTIME in the last 168 hours.  Cardiac Enzymes No results for input(s): CKMB, TROPONINI, MYOGLOBIN in the last 168 hours.  Invalid input(s): CK  Invalid input(s): POCBNP No results for input(s): DDIMER in the last 72 hours.  Recent Labs  01/15/15 1705  HGBA1C 5.1   No results for input(s): CHOL, HDL, LDLCALC, TRIG, CHOLHDL, LDLDIRECT in the last 72 hours.  Recent Labs  01/15/15 1705  TSH 0.606   No results for input(s): VITAMINB12, FOLATE, FERRITIN, TIBC, IRON, RETICCTPCT in the last 72 hours.  Recent Labs  01/15/15 1210  LIPASE 14*    Urine Studies No results for input(s): UHGB, CRYS in the last 72 hours.  Invalid input(s): UACOL, UAPR, USPG, UPH, UTP, UGL, UKET, UBIL, UNIT, UROB, ULEU, UEPI, UWBC, URBC, UBAC, CAST, UCOM, BILUA  MICROBIOLOGY: Recent Results (from the past 240 hour(s))  Urine culture     Status: None   Collection Time: 01/15/15  7:34 PM  Result Value Ref Range Status   Specimen Description URINE, RANDOM  Final   Special Requests NONE  Final   Colony Count NO GROWTH Performed at [facility]   Final   Culture NO GROWTH Performed at [facility]   Final   Report Status 01/16/2015 FINAL  Final    RADIOLOGY STUDIES/RESULTS: Dg Abd Acute W/chest  01/15/2015   CLINICAL DATA:  Acute generalized abdominal pain.  EXAM: DG ABDOMEN ACUTE W/ 1V CHEST  COMPARISON:  December 05, 2014.  FINDINGS: There is no evidence of dilated bowel loops or free intraperitoneal air. No radiopaque calculi or other significant radiographic abnormality is seen. Heart size and mediastinal contours are within normal limits. Both lungs are clear. Status post cholecystectomy.  IMPRESSION: No evidence of bowel obstruction or ileus. No acute cardiopulmonary disease.   Electronically Signed   By: Lupita Raider, M.D.   On: 01/15/2015 13:42    Jeoffrey Massed, MD  [facility] Pager:[phone]  If 7PM-7AM, please contact  night-coverage [email or web address] Password [email or web address] 01/17/2015, 3:00 PM   LOS: 1 day

## 2015-01-18 MED ORDER — ZOLPIDEM TARTRATE 5 MG PO TABS: 5.0000 mg | ORAL_TABLET | Freq: Every evening | ORAL | Status: DC | PRN

## 2015-01-18 MED ORDER — PANTOPRAZOLE SODIUM 40 MG PO TBEC: 40.0000 mg | DELAYED_RELEASE_TABLET | Freq: Every day | ORAL | Status: DC

## 2015-01-18 MED ORDER — ONDANSETRON 4 MG PO TBDP: 4.0000 mg | ORAL_TABLET | Freq: Three times a day (TID) | ORAL | Status: DC | PRN

## 2015-01-18 MED ORDER — OMEPRAZOLE 40 MG PO CPDR: 40.0000 mg | DELAYED_RELEASE_CAPSULE | Freq: Every day | ORAL | Status: DC

## 2015-01-18 MED ORDER — POLYETHYLENE GLYCOL 3350 17 G PO PACK: 17.0000 g | PACK | Freq: Every day | ORAL | Status: DC

## 2015-01-18 MED ORDER — METOCLOPRAMIDE HCL 5 MG PO TABS: 5.0000 mg | ORAL_TABLET | Freq: Three times a day (TID) | ORAL | Status: DC

## 2015-01-18 NOTE — Progress Notes (Signed)
The patient is receiving Protonix by the intravenous route.  Based on criteria approved by the Pharmacy and Therapeutics Committee and the Medical Executive Committee, the medication is being converted to the equivalent oral dose form.  These criteria include: -No Active GI bleeding -Able to tolerate diet of full liquids (or better) or tube feeding -Able to tolerate other medications by the oral or enteral route  If you have any questions about this conversion, please contact the Pharmacy Department (phone [phone]).  Thank you.  Otho BellowsGreen, Darienne Belleau L PharmD Pager [phone] 01/18/2015, 10:06 AM

## 2015-01-18 NOTE — Discharge Instructions (Signed)
Cannabis Use Disorder Cannabis use disorder is a mental disorder. It is not one-time or occasional use of cannabis, more commonly known as marijuana. Cannabis use disorder is the continued, nonmedical use of cannabis that interferes with normal life activities or causes health problems. People with cannabis use disorder get a feeling of extreme pleasure and relaxation from cannabis use. This "high" is very rewarding and causes people to use over and over.  The mind-altering ingredient in cannabis is know as THC. THC can also interfere with motor coordination, memory, judgment, and accurate sense of space and time. These effects can last for a few days after using cannabis. Regular heavy cannabis use can cause long-lasting problems with thinking and learning. In young people, these problems may be permanent. Cannabis sometimes causes severe anxiety, paranoia, or visual hallucinations. Man-made (synthetic) cannabis-like drugs, such as "spice" and "K2," cause the same effects as THC but are much stronger. Cannabis-like drugs can cause dangerously high blood pressure and heart rate.  Cannabis use disorder usually starts in the teenage years. It can trigger the development of schizophrenia. It is somewhat more common in men than women. People who have family members with the disorder or existing mental health issues such as depression and posttraumatic stress disorderare more likely to develop cannabis use disorder. People with cannabis use disorder are at higher risk for use of other drugs of abuse.  SIGNS AND SYMPTOMS Signs and symptoms of cannabis use disorder include:   Use of cannabis in larger amounts or over a longer period than intended.   Unsuccessful attempts to cut down or control cannabis use.   A lot of time spent obtaining, using, or recovering from the effects of cannabis.   A strong desire or urge to use cannabis (cravings).   Continued use of cannabis in spite of problems at work,  school, or home because of use.   Continued use of cannabis in spite of relationship problems because of use.  Giving up or cutting down on important life activities because of cannabis use.  Use of cannabis over and over even in situations when it is physically hazardous, such as when driving a car.   Continued use of cannabis in spite of a physical problem that is likely related to use. Physical problems can include:  Chronic cough.  Bronchitis.  Emphysema.  Throat and lung cancer.  Continued use of cannabis in spite of a mental problem that is likely related to use. Mental problems can include:  Psychosis.  Anxiety.  Difficulty sleeping.  Need to use more and more cannabis to get the same effect, or lessened effect over time with use of the same amount (tolerance).  Having withdrawal symptoms when cannabis use is stopped, or using cannabis to reduce or avoid withdrawal symptoms. Withdrawal symptoms include:  Irritability or anger.  Anxiety or restlessness.  Difficulty sleeping.  Loss of appetite or weight.  Aches and pains.  Shakiness.  Sweating.  Chills. DIAGNOSIS Cannabis use disorder is diagnosed by your health care provider. You may be asked questions about your cannabis use and how it affects your life. A physical exam may be done. A drug screen may be done. You may be referred to a mental health professional. The diagnosis of cannabis use disorder requires at least two symptoms within 12 months. The type of cannabis use disorder you have depends on the number of symptoms you have. The type may be:  Mild. Two or three signs and symptoms.   Moderate. Four or   five signs and symptoms.   Severe. Six or more signs and symptoms.  TREATMENT Treatment is usually provided by mental health professionals with training in substance use disorders. The following options are available:  Counseling or talk therapy. Talk therapy addresses the reasons you use  cannabis. It also addresses ways to keep you from using again. The goals of talk therapy include:  Identifying and avoiding triggers for use.  Learning how to handle cravings.  Replacing use with healthy activities.  Support groups. Support groups provide emotional support, advice, and guidance.  Medicine. Medicine is used to treat mental health issues that trigger cannabis use or that result from it. HOME CARE INSTRUCTIONS  Take medicines only as directed by your health care provider.  Check with your health care provider before starting any new medicines.  Keep all follow-up visits as directed by your health care provider. SEEK MEDICAL CARE IF:  You are not able to take your medicines as directed.  Your symptoms get worse. SEEK IMMEDIATE MEDICAL CARE IF: You have serious thoughts about hurting yourself or others. FOR MORE INFORMATION  National Institute on Drug Abuse: http://www.price-smith.com/  Substance Abuse and Mental Health Services Administration: SkateOasis.com.pt Document Released: 07/29/2000 Document Revised: 12/16/2013 Document Reviewed: 08/14/2013 [facility] Patient Information 2015 [place], [place]. This information is not intended to replace advice given to you by your health care provider. Make sure you discuss any questions you have with your health care provider. Gastroparesis  Gastroparesis is also called slowed stomach emptying (delayed gastric emptying). It is a condition in which the stomach takes too long to empty its contents. It often happens in people with diabetes.  CAUSES  Gastroparesis happens when nerves to the stomach are damaged or stop working. When the nerves are damaged, the muscles of the stomach and intestines do not work normally. The movement of food is slowed or stopped. High blood glucose (sugar) causes changes in nerves and can damage the blood vessels that carry oxygen and nutrients to the nerves. RISK FACTORS  Diabetes.  Post-viral  syndromes.  Eating disorders (anorexia, bulimia).  Surgery on the stomach or vagus nerve.  Gastroesophageal reflux disease (rarely).  Smooth muscle disorders (amyloidosis, scleroderma).  Metabolic disorders, including hypothyroidism.  Parkinson disease. SYMPTOMS   Heartburn.  Feeling sick to your stomach (nausea).  Vomiting of undigested food.  An early feeling of fullness when eating.  Weight loss.  Abdominal bloating.  Erratic blood glucose levels.  Lack of appetite.  Gastroesophageal reflux.  Spasms of the stomach wall. Complications can include:  Bacterial overgrowth in stomach. Food stays in the stomach and can ferment and cause bacteria to grow.  Weight loss due to difficulty digesting and absorbing nutrients.  Vomiting.  Obstruction in the stomach. Undigested food can harden and cause nausea and vomiting.  Blood glucose fluctuations caused by inconsistent food absorption. DIAGNOSIS  The diagnosis of gastroparesis is confirmed through one or more of the following tests:  Barium X-rays and scans. These tests look at how long it takes for food to move through the stomach.  Gastric manometry. This test measures electrical and muscular activity in the stomach. A thin tube is passed down the throat into the stomach. The tube contains a wire that takes measurements of the stomach's electrical and muscular activity as it digests liquids and solid food.  Endoscopy. This procedure is done with a long, thin tube called an endoscope. It is passed through the mouth and gently down the esophagus into the stomach. This tube helps  the caregiver look at the lining of the stomach to check for any abnormalities.  Ultrasonography. This can rule out gallbladder disease or pancreatitis. This test will outline and define the shape of the gallbladder and pancreas. TREATMENT   Treatments may include:  Exercise.  Medicines to control nausea and vomiting.  Medicines to  stimulate stomach muscles.  Changes in what and when you eat.  Having smaller meals more often.  Eating low-fiber forms of high-fiber foods, such as eating cooked vegetables instead of raw vegetables.  Eating low-fat foods.  Consuming liquids, which are easier to digest.  In severe cases, feeding tubes and intravenous (IV) feeding may be needed. It is important to note that in most cases, treatment does not cure gastroparesis. It is usually a lasting (chronic) condition. Treatment helps you manage the underlying condition so that you can be as healthy and comfortable as possible. Other treatments  A gastric neurostimulator has been developed to assist people with gastroparesis. The battery-operated device is surgically implanted. It emits mild electrical pulses to help improve stomach emptying and to control nausea and vomiting.  The use of botulinum toxin has been shown to improve stomach emptying by decreasing the prolonged contractions of the muscle between the stomach and the small intestine (pyloric sphincter). The benefits are temporary. SEEK MEDICAL CARE IF:   You have diabetes and you are having problems keeping your blood glucose in goal range.  You are having nausea, vomiting, bloating, or early feelings of fullness with eating.  Your symptoms do not change with a change in diet. Document Released: 08/01/2005 Document Revised: 11/26/2012 Document Reviewed: 01/08/2009 [facility] Patient Information 2015 [place]. This information is not intended to replace advice given to you by your health care provider. Make sure you discuss any questions you have with your health care provider.

## 2015-01-18 NOTE — Discharge Summary (Signed)
PATIENT DETAILS Name: Matthew Scott Age: 34 y.o. Sex: male Date of Birth: 1980/09/28 MRN: [id number]. Admitting Physician: Cathren Harsh, MD PCP:No PCP Per Patient  Admit Date: 01/15/2015 Discharge date: 01/18/2015  Recommendations for Outpatient Follow-up:  1. Emphasized regarding compliance to medications 2. 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 Asthma   . Chronic abdominal pain   . Nausea and vomiting     chronic, recurrent  . Polysubstance abuse   . Gastroparesis   . 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 GASTRITIS 12/09/2009    Qualifier: Diagnosis of  By: Daphine Deutscher FNP, Zena Amos      DISCHARGE MEDICATIONS: Current Discharge Medication List    START taking these medications   Details  polyethylene glycol (MIRALAX / GLYCOLAX) packet Take 17 g by mouth daily. Qty: 14 each, Refills: 0    zolpidem (AMBIEN) 5 MG tablet Take 1 tablet (5 mg total) by mouth at bedtime as needed for sleep. Qty: 7 tablet, Refills: 0      CONTINUE these medications which have CHANGED   Details  metoCLOPramide (REGLAN) 5 MG tablet Take 1 tablet (5 mg total) by mouth 3 (three) times daily before meals. Qty: 60 tablet, Refills: 0    omeprazole (PRILOSEC) 40 MG capsule Take 1 capsule (40 mg total) by mouth daily. Qty: 30 capsule, Refills: 0   Associated Diagnoses: Gastritis    ondansetron (ZOFRAN ODT) 4 MG disintegrating tablet Take 1 tablet (4 mg total) by mouth every 8 (eight) hours as needed for  nausea. Qty: 30 tablet, Refills: 0      STOP taking these medications     promethazine (PHENERGAN) 25 MG tablet      ibuprofen (ADVIL,MOTRIN) 400 MG tablet      promethazine (PHENERGAN) 25 MG suppository         ALLERGIES:  No Known Allergies  BRIEF HPI:  See H&P, Labs, Consult and Test reports for all details in brief, patient was admitted for evaluation of persistent nausea vomiting and abdominal pain.  CONSULTATIONS:   None  PERTINENT RADIOLOGIC STUDIES: Dg Abd Acute W/chest  01/15/2015   CLINICAL DATA:  Acute generalized abdominal pain.  EXAM: DG ABDOMEN ACUTE W/ 1V CHEST  COMPARISON:  December 05, 2014.  FINDINGS: There is no evidence of dilated bowel loops or free intraperitoneal air. No radiopaque calculi or other significant radiographic abnormality is seen. Heart size and mediastinal contours are within normal limits. Both lungs are clear. Status post cholecystectomy.  IMPRESSION: No evidence of bowel obstruction or ileus. No acute cardiopulmonary disease.   Electronically Signed   By: Lupita Raider, M.D.   On: 01/15/2015 13:42     PERTINENT LAB RESULTS: CBC:  Recent Labs  01/15/15 1705 01/16/15 0405  WBC 11.0* 9.0  HGB 12.3* 11.6*  HCT 37.3* 36.0*  PLT 255 256   CMET CMP     Component Value Date/Time   NA 140 01/16/2015 0405   K 3.5 01/16/2015 0405   CL 106  01/16/2015 0405   CO2 26 01/16/2015 0405   GLUCOSE 94 01/16/2015 0405   BUN 18 01/16/2015 0405   CREATININE 1.00 01/16/2015 0405   CREATININE 0.88 12/19/2014 1435   CALCIUM 9.3 01/16/2015 0405   PROT 6.4* 01/16/2015 0405   ALBUMIN 3.5 01/16/2015 0405   AST 19 01/16/2015 0405   ALT 25 01/16/2015 0405   ALKPHOS 51 01/16/2015 0405   BILITOT 2.9* 01/16/2015 0405   GFRNONAA >60 01/16/2015 0405   GFRAA >60 01/16/2015 0405    GFR Estimated Creatinine Clearance: 103.1 mL/min (by C-G formula based on Cr of 1).  Recent Labs  01/15/15 1210  LIPASE 14*   No results for input(s): CKTOTAL, CKMB,  CKMBINDEX, TROPONINI in the last 72 hours. Invalid input(s): POCBNP No results for input(s): DDIMER in the last 72 hours.  Recent Labs  01/15/15 1705  HGBA1C 5.1   No results for input(s): CHOL, HDL, LDLCALC, TRIG, CHOLHDL, LDLDIRECT in the last 72 hours.  Recent Labs  01/15/15 1705  TSH 0.606   No results for input(s): VITAMINB12, FOLATE, FERRITIN, TIBC, IRON, RETICCTPCT in the last 72 hours. Coags: No results for input(s): INR in the last 72 hours.  Invalid input(s): PT Microbiology: Recent Results (from the past 240 hour(s))  Urine culture     Status: None   Collection Time: 01/15/15  7:34 PM  Result Value Ref Range Status   Specimen Description URINE, RANDOM  Final   Special Requests NONE  Final   Colony Count NO GROWTH Performed at [facility]   Final   Culture NO GROWTH Performed at [facility]   Final   Report Status 01/16/2015 FINAL  Final     BRIEF HOSPITAL COURSE:    Nausea & vomiting:: Multifactorial-likely due to gastroparesis, ongoing marijuana use and perhaps cyclical vomiting syndrome. Abdomen Soft,with no tenderness. Admitted and managed with supportive measures including scheduled IV Reglan, IV fluid and other antiemetics as needed. Also placed on a bowel regimen with MiraLAX/Senokot as he had not had a bowel movement for 5-6 days prior to this admission. His diet was slowly advanced. He slowly made clinical improvement, by day of discharge he was free of nausea, vomiting or abdominal pain. He tolerated a regular diet and was requesting discharge home today. Have counseled regarding importance of avoiding marijuana.  Active Problems:  Severe tetrahydrocannabinol (THC) dependence:counseled extensively   Constipation: Resolved with bowel regimen  TODAY-DAY OF DISCHARGE:  Subjective:   [place] today has no headache,no chest abdominal pain,no new weakness tingling or numbness, feels much better wants to go home today.    Objective:   Blood pressure 133/88, pulse 58, temperature 98.4 F (36.9 C), temperature source Oral, resp. rate 18, height 5\' 9"  (1.753 m), weight 83.915 kg (185 lb), SpO2 99 %.  Intake/Output Summary (Last 24 hours) at 01/18/15 1003 Last data filed at 01/17/15 2126  Gross per 24 hour  Intake   1680 ml  Output   1300 ml  Net    380 ml   Filed Weights   01/15/15 1615  Weight: 83.915 kg (185 lb)    Exam Awake Alert, Oriented *3, No new F.N deficits, Normal affect [place].AT,PERRAL Supple Neck,No JVD, No cervical lymphadenopathy appriciated.  Symmetrical Chest wall movement, Good air movement bilaterally, CTAB RRR,No Gallops,Rubs or new Murmurs, No Parasternal Heave +ve B.Sounds, Abd Soft, Non tender, No organomegaly appriciated, No rebound -guarding or rigidity. No Cyanosis, Clubbing or edema, No new Rash or bruise  DISCHARGE CONDITION:  Stable  DISPOSITION:  Home  DISCHARGE INSTRUCTIONS:    Activity:  As tolerated   Diet recommendation: Regular Diet-with small portion meals.  Discharge Instructions    Diet general    Complete by:  As directed   Small portion meals-multiple times during the day. Avoid large meals.     Increase activity slowly    Complete by:  As directed            Follow-up Information    Follow up with [facility]    . Schedule an appointment as soon as possible for a visit in 1 week.   Why:  Please call on 01/19/15 and make an appointment for 1 week. Please let them know that you were in the hospital.   Contact information:   [address] [place] [address] [phone]     Total Time spent on discharge equals 25 minutes.  SignedJeoffrey Massed 01/18/2015 10:03 AM

## 2015-01-21 ENCOUNTER — Encounter: Payer: Self-pay | Admitting: Family Medicine

## 2015-01-21 ENCOUNTER — Ambulatory Visit: Payer: Self-pay | Attending: Family Medicine | Admitting: Family Medicine

## 2015-01-21 VITALS — BP 132/75 | HR 67 | Temp 97.5°F | Resp 18 | Ht 69.0 in | Wt 180.0 lb

## 2015-01-21 DIAGNOSIS — F1220 Cannabis dependence, uncomplicated: Secondary | ICD-10-CM

## 2015-01-21 DIAGNOSIS — G43A0 Cyclical vomiting, not intractable: Secondary | ICD-10-CM

## 2015-01-21 DIAGNOSIS — K219 Gastro-esophageal reflux disease without esophagitis: Secondary | ICD-10-CM | POA: Insufficient documentation

## 2015-01-21 DIAGNOSIS — R1115 Cyclical vomiting syndrome unrelated to migraine: Secondary | ICD-10-CM

## 2015-01-21 DIAGNOSIS — R61 Generalized hyperhidrosis: Secondary | ICD-10-CM | POA: Insufficient documentation

## 2015-01-21 NOTE — Patient Instructions (Signed)
Cannabis Use Disorder Cannabis use disorder is a mental disorder. It is not one-time or occasional use of cannabis, more commonly known as marijuana. Cannabis use disorder is the continued, nonmedical use of cannabis that interferes with normal life activities or causes health problems. People with cannabis use disorder get a feeling of extreme pleasure and relaxation from cannabis use. This "high" is very rewarding and causes people to use over and over.  The mind-altering ingredient in cannabis is know as THC. THC can also interfere with motor coordination, memory, judgment, and accurate sense of space and time. These effects can last for a few days after using cannabis. Regular heavy cannabis use can cause long-lasting problems with thinking and learning. In young people, these problems may be permanent. Cannabis sometimes causes severe anxiety, paranoia, or visual hallucinations. Man-made (synthetic) cannabis-like drugs, such as "spice" and "K2," cause the same effects as THC but are much stronger. Cannabis-like drugs can cause dangerously high blood pressure and heart rate.  Cannabis use disorder usually starts in the teenage years. It can trigger the development of schizophrenia. It is somewhat more common in men than women. People who have family members with the disorder or existing mental health issues such as depression and posttraumatic stress disorderare more likely to develop cannabis use disorder. People with cannabis use disorder are at higher risk for use of other drugs of abuse.  SIGNS AND SYMPTOMS Signs and symptoms of cannabis use disorder include:   Use of cannabis in larger amounts or over a longer period than intended.   Unsuccessful attempts to cut down or control cannabis use.   A lot of time spent obtaining, using, or recovering from the effects of cannabis.   A strong desire or urge to use cannabis (cravings).   Continued use of cannabis in spite of problems at work,  school, or home because of use.   Continued use of cannabis in spite of relationship problems because of use.  Giving up or cutting down on important life activities because of cannabis use.  Use of cannabis over and over even in situations when it is physically hazardous, such as when driving a car.   Continued use of cannabis in spite of a physical problem that is likely related to use. Physical problems can include:  Chronic cough.  Bronchitis.  Emphysema.  Throat and lung cancer.  Continued use of cannabis in spite of a mental problem that is likely related to use. Mental problems can include:  Psychosis.  Anxiety.  Difficulty sleeping.  Need to use more and more cannabis to get the same effect, or lessened effect over time with use of the same amount (tolerance).  Having withdrawal symptoms when cannabis use is stopped, or using cannabis to reduce or avoid withdrawal symptoms. Withdrawal symptoms include:  Irritability or anger.  Anxiety or restlessness.  Difficulty sleeping.  Loss of appetite or weight.  Aches and pains.  Shakiness.  Sweating.  Chills. DIAGNOSIS Cannabis use disorder is diagnosed by your health care provider. You may be asked questions about your cannabis use and how it affects your life. A physical exam may be done. A drug screen may be done. You may be referred to a mental health professional. The diagnosis of cannabis use disorder requires at least two symptoms within 12 months. The type of cannabis use disorder you have depends on the number of symptoms you have. The type may be:  Mild. Two or three signs and symptoms.   Moderate. Four or   five signs and symptoms.   °· Severe. Six or more signs and symptoms.   °TREATMENT °Treatment is usually provided by mental health professionals with training in substance use disorders. The following options are available: °· Counseling or talk therapy. Talk therapy addresses the reasons you use  cannabis. It also addresses ways to keep you from using again. The goals of talk therapy include: °¨ Identifying and avoiding triggers for use. °¨ Learning how to handle cravings. °¨ Replacing use with healthy activities. °· Support groups. Support groups provide emotional support, advice, and guidance. °· Medicine. Medicine is used to treat mental health issues that trigger cannabis use or that result from it. °HOME CARE INSTRUCTIONS °· Take medicines only as directed by your health care provider. °· Check with your health care provider before starting any new medicines. °· Keep all follow-up visits as directed by your health care provider. °SEEK MEDICAL CARE IF: °· You are not able to take your medicines as directed. °· Your symptoms get worse. °SEEK IMMEDIATE MEDICAL CARE IF: °You have serious thoughts about hurting yourself or others. °FOR MORE INFORMATION °· National Institute on Drug Abuse: www.drugabuse.gov °· Substance Abuse and Mental Health Services Administration: www.samhsa.gov °Document Released: 07/29/2000 Document Revised: 12/16/2013 Document Reviewed: 08/14/2013 °ExitCare® Patient Information ©2015 [employer]. This information is not intended to replace advice given to you by your health care provider. Make sure you discuss any questions you have with your health care provider. ° °

## 2015-01-21 NOTE — Progress Notes (Signed)
Patient here for gastritis follow up. Patient has been in hospital since being seen here. Was hospitalized 6/2-6/5. Patient reports feeling good, denies pain at this time. Patient complains of sweating at night and can't sleep.

## 2015-01-21 NOTE — Progress Notes (Signed)
Subjective:    Patient ID: Matthew Scott, male    DOB: 10/08/1980, 34 y.o.   MRN: [id number]  HPI  Admit Date: 01/15/15 Discharge Date: 01/18/15  Matthew Scott is a 34 year old male with a history of GERD, Gastroparesis, cyclical vomiting, Cannabis abuse who presented to the ED with nausea, vomiting and abdominal pain. He has had multiple hospitalizations for same and vomiting has been educated to gastroparesis and cannabis hyperemesis syndrome but he continues to use marijuana. During this hospitalization he was managed with IV fluids, IV Reglan and MiraLAX/Senokot for constipation as well with resulting improvement.  He was able to tolerate orally and was discharged after counseled against using marijuana.  Interval History: Nausea and vomiting have resolved. Complains of night sweats going on for several months; his last TSH done a few days ago came back negative, denies fevers cough or weight loss.  Past Medical History  Diagnosis Date  . Asthma   . Chronic abdominal pain   . Nausea and vomiting     chronic, recurrent  . Polysubstance abuse   . Gastroparesis   . HELICOBACTER PYLORI INFECTION 12/14/2009    Qualifier: Diagnosis of  By: Matthew Scott, Matthew    . BILIARY 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    Qualifier: Diagnosis of  By: Daphine DeutscherMartin Scott, Matthew Scott    . GASTRITIS 12/09/2009    Qualifier: Diagnosis of  By: Daphine DeutscherMartin Scott, Matthew Scott      Past Surgical History  Procedure Laterality Date  . Cholecystectomy  11/2009    No Known Allergies  Current Outpatient Prescriptions on File Prior to Visit  Medication Sig Dispense Refill  . metoCLOPramide (REGLAN) 5 MG tablet Take 1 tablet (5 mg total) by mouth 3 (three) times daily before meals. 60 tablet 0  . omeprazole (PRILOSEC) 40 MG capsule Take 1 capsule (40 mg total) by mouth daily. 30 capsule 0  . ondansetron (ZOFRAN ODT) 4 MG disintegrating tablet Take 1 tablet (4 mg total) by mouth every 8 (eight) hours as needed for nausea. 30 tablet 0  .  polyethylene glycol (MIRALAX / GLYCOLAX) packet Take 17 g by mouth daily. 14 each 0  . zolpidem (AMBIEN) 5 MG tablet Take 1 tablet (5 mg total) by mouth at bedtime as needed for sleep. 7 tablet 0   No current facility-administered medications on file prior to visit.      Interval History:  Review of Systems  Constitutional: Negative for activity change and appetite change.  HENT: Negative for sinus pressure and sore throat.   Eyes: Negative for visual disturbance.  Respiratory: Negative for chest tightness and shortness of breath.   Cardiovascular: Negative for chest pain and palpitations.  Gastrointestinal: Negative for abdominal pain and abdominal distention.  Endocrine: Negative for cold intolerance, heat intolerance and polyphagia.  Genitourinary: Negative for dysuria, frequency and difficulty urinating.  Musculoskeletal: Negative for back pain, joint swelling and arthralgias.  Skin: Negative for color change.  Neurological: Negative for dizziness, tremors and weakness.  Psychiatric/Behavioral: Negative for suicidal ideas and behavioral problems.         Objective: Filed Vitals:   01/21/15 1214  BP: 132/75  Pulse: 67  Temp: 97.5 F (36.4 C)  TempSrc: Oral  Resp: 18  Height: 5\' 9"  (1.753 m)  Weight: 180 lb (81.647 kg)  SpO2: 98%      Physical Exam  Constitutional: He is oriented to person, place, and time. He appears well-developed and well-nourished.  HENT:  Head: Normocephalic and atraumatic.  Right Ear: External  ear normal.  Left Ear: External ear normal.  Eyes: Conjunctivae and EOM are normal. Pupils are equal, round, and reactive to light.  Neck: Normal range of motion. Neck supple. No tracheal deviation present.  Cardiovascular: Normal rate, regular rhythm and normal heart sounds.   No murmur heard. Pulmonary/Chest: Effort normal and breath sounds normal. No respiratory distress. He has no wheezes. He exhibits no tenderness.  Abdominal: Soft. Bowel sounds  are normal. He exhibits no mass. There is no tenderness.  Musculoskeletal: Normal range of motion. He exhibits no edema or tenderness.  Neurological: He is alert and oriented to person, place, and time.  Skin: Skin is warm and dry.  Psychiatric: He has a normal mood and affect.                                       Assessment & Plan:  34 year old male with a history of GERD, Gastroparesis, cyclical vomiting syndrome,Cannabis abuse recently managed for nausea and vomiting with improvement in clinical symptoms.  GERD: Controlled. He endorses the fact that he has effective changes to his eating habits which has greatly helped his symptoms. Continue omeprazole.  Cyclical vomiting: Multifactorial including gastroparesis and cannabis hyperemesis syndrome. Counseled against cannabis use.  Night sweats: Unknown etiology, thyroid disease has been ruled out. PPD placed in the clinic today to exclude TB.   This note has been created with Education officer, environmental. Any transcriptional errors are unintentional.

## 2015-01-22 ENCOUNTER — Telehealth: Payer: Self-pay | Admitting: Clinical

## 2015-01-22 NOTE — Telephone Encounter (Signed)
Left HIPPA-compliant message to call back

## 2015-01-23 ENCOUNTER — Ambulatory Visit: Payer: Self-pay | Attending: Internal Medicine | Admitting: *Deleted

## 2015-01-23 LAB — TB SKIN TEST
Induration: 0 mm
TB Skin Test: NEGATIVE

## 2015-01-23 NOTE — Progress Notes (Signed)
PPD read and results entered in Epic Result:  0 mm induration 0 erythema Interpretation: negative

## 2015-01-31 ENCOUNTER — Other Ambulatory Visit: Payer: Self-pay | Admitting: Family Medicine

## 2015-02-04 ENCOUNTER — Ambulatory Visit: Payer: Self-pay | Attending: Internal Medicine | Admitting: Internal Medicine

## 2015-02-04 ENCOUNTER — Encounter: Payer: Self-pay | Admitting: Internal Medicine

## 2015-02-04 VITALS — BP 123/71 | HR 70 | Wt 185.0 lb

## 2015-02-04 DIAGNOSIS — R1115 Cyclical vomiting syndrome unrelated to migraine: Secondary | ICD-10-CM

## 2015-02-04 DIAGNOSIS — F1210 Cannabis abuse, uncomplicated: Secondary | ICD-10-CM

## 2015-02-04 DIAGNOSIS — G43A0 Cyclical vomiting, not intractable: Secondary | ICD-10-CM

## 2015-02-04 MED ORDER — OMEPRAZOLE 40 MG PO CPDR: 40.0000 mg | DELAYED_RELEASE_CAPSULE | Freq: Every day | ORAL | Status: DC

## 2015-02-04 MED ORDER — METOCLOPRAMIDE HCL 5 MG PO TABS: 5.0000 mg | ORAL_TABLET | Freq: Three times a day (TID) | ORAL | Status: DC

## 2015-02-04 NOTE — Progress Notes (Signed)
Patient ID: Matthew Scott, male   DOB: 1981/02/20, 34 y.o.   MRN: [id number]  CC: establish care  HPI: Matthew Scott is a 34 y.o. male here today for a follow up visit.  Patient has past medical history of asthma, chronic nausea, substance abuse, and gastritis. He denies any other drug use other than mariajuana. He denies alcohol and tobacco use. He reports that when he takes Reglan he notices improvement in symptoms. He is not willing to quit smoking marijuana at this time because it calms him down.  Patient has No headache, No chest pain, No abdominal pain, No weight loss, No Cough - SOB.  No Known Allergies Past Medical History  Diagnosis Date  . Asthma   . Chronic abdominal pain   . Nausea and vomiting     chronic, recurrent  . Polysubstance abuse   . Gastroparesis   . 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 GASTRITIS 12/09/2009    Qualifier: Diagnosis of  By: Daphine Deutscher FNP, Zena Amos     Current Outpatient Prescriptions on File Prior to Visit  Medication Sig Dispense Refill  . metoCLOPramide (REGLAN) 5 MG tablet Take 1 tablet (5 mg total) by mouth 3 (three) times daily before meals. 60 tablet 0  . omeprazole (PRILOSEC) 40 MG capsule Take 1 capsule (40 mg total) by mouth daily. 30 capsule 0  . polyethylene glycol (MIRALAX / GLYCOLAX) packet Take 17 g by mouth daily. 14 each 0  . zolpidem (AMBIEN) 5 MG tablet Take 1 tablet (5 mg total) by mouth at bedtime as needed for sleep. 7 tablet 0  . ondansetron (ZOFRAN ODT) 4 MG disintegrating tablet Take 1 tablet (4 mg total) by mouth every 8 (eight) hours as needed for nausea. (Patient not taking: Reported on 02/04/2015) 30 tablet 0   No current facility-administered medications on file prior to visit.   Family History  Problem Relation Age of Onset  . Other Father   . Diabetes Father   . Hypertension  Father    History   Social History  . Marital Status: Single    Spouse Name: N/A  . Number of Children: N/A  . Years of Education: N/A   Occupational History  . Unemployed.    Social History Main Topics  . Smoking status: Former Smoker -- 8 years    Types: Cigars    Quit date: 12/15/2014  . Smokeless tobacco: Never Used  . Alcohol Use: 0.0 oz/week    0 Standard drinks or equivalent per week     Comment: 12/16/2014 " I no Longer drink ,I quit about 2 years ago "  . Drug Use: Yes    Special: Marijuana     Comment: Marijuana, last use 01/10/15  . Sexual Activity: Yes   Other Topics Concern  . Not on file   Social History Narrative   Single, lives with parents.  Ambulatory.    Review of Systems: See HPI   Objective:   Filed Vitals:   02/04/15 0949  BP: 123/71  Pulse: 70    Physical Exam  Cardiovascular: Normal rate, regular rhythm and normal heart sounds.   Pulmonary/Chest: Effort normal and breath sounds normal.  Abdominal: Soft. Bowel sounds are normal. He exhibits no distension. There is tenderness (LLQ).     Lab Results  Component Value Date  WBC 9.0 01/16/2015   HGB 11.6* 01/16/2015   HCT 36.0* 01/16/2015   MCV 96.8 01/16/2015   PLT 256 01/16/2015   Lab Results  Component Value Date   CREATININE 1.00 01/16/2015   BUN 18 01/16/2015   NA 140 01/16/2015   K 3.5 01/16/2015   CL 106 01/16/2015   CO2 26 01/16/2015    Lab Results  Component Value Date   HGBA1C 5.1 01/15/2015   Lipid Panel     Component Value Date/Time   CHOL  11/24/2008 2318    150        ATP III CLASSIFICATION:  <200     mg/dL   Desirable  161-096  mg/dL   Borderline High  >=045    mg/dL   High          TRIG 31 11/24/2008 2318   HDL 52 11/24/2008 2318   CHOLHDL 2.9 11/24/2008 2318   VLDL 6 11/24/2008 2318   LDLCALC  11/24/2008 2318    92        Total Cholesterol/HDL:CHD Risk Coronary Heart Disease Risk Table                     Men   Women  1/2 Average Risk   3.4    3.3  Average Risk       5.0   4.4  2 X Average Risk   9.6   7.1  3 X Average Risk  23.4   11.0        Use the calculated Patient Ratio above and the CHD Risk Table to determine the patient's CHD Risk.        ATP III CLASSIFICATION (LDL):  <100     mg/dL   Optimal  409-811  mg/dL   Near or Above                    Optimal  130-159  mg/dL   Borderline  914-782  mg/dL   High  >956     mg/dL   Very High       Assessment and plan:   Demetre was seen today for new patient.  Diagnoses and all orders for this visit:  Marijuana abuse Addressed quitting and went over marijuana induced nausea and vomiting. He is unwilling to quit at this time because he knows that it is not causing his problem.  Non-intractable cyclical vomiting with nausea Orders: -     metoCLOPramide (REGLAN) 5 MG tablet; Take 1 tablet (5 mg total) by mouth 3 (three) times daily before meals. -     omeprazole (PRILOSEC) 40 MG capsule; Take 1 capsule (40 mg total) by mouth daily. See above  Return if symptoms worsen or fail to improve. I have asked patient to refrain from using ER unless truly needed. He may return to medical center for treatment during business hours.       Holland Commons, NP-C [facility] [phone] 02/04/2015, 10:23 AM

## 2015-02-04 NOTE — Progress Notes (Signed)
  New patient here to established care. Pt does not know why he is here today.

## 2015-02-04 NOTE — Patient Instructions (Signed)
Stop smoking weed it wil help with your stomach and nausea

## 2015-04-24 ENCOUNTER — Encounter (HOSPITAL_COMMUNITY): Payer: Self-pay

## 2015-04-24 ENCOUNTER — Emergency Department (HOSPITAL_COMMUNITY)
Admission: EM | Admit: 2015-04-24 | Discharge: 2015-04-25 | Disposition: A | Discharge status: Home / Self Care | Payer: Self-pay | Attending: Emergency Medicine | Admitting: Emergency Medicine

## 2015-04-24 ENCOUNTER — Emergency Department (HOSPITAL_COMMUNITY): Payer: Self-pay

## 2015-04-24 DIAGNOSIS — G8929 Other chronic pain: Secondary | ICD-10-CM | POA: Insufficient documentation

## 2015-04-24 DIAGNOSIS — J45909 Unspecified asthma, uncomplicated: Secondary | ICD-10-CM | POA: Insufficient documentation

## 2015-04-24 DIAGNOSIS — Z79899 Other long term (current) drug therapy: Secondary | ICD-10-CM | POA: Insufficient documentation

## 2015-04-24 DIAGNOSIS — Z87891 Personal history of nicotine dependence: Secondary | ICD-10-CM | POA: Insufficient documentation

## 2015-04-24 DIAGNOSIS — R1115 Cyclical vomiting syndrome unrelated to migraine: Secondary | ICD-10-CM

## 2015-04-24 DIAGNOSIS — K3184 Gastroparesis: Secondary | ICD-10-CM | POA: Insufficient documentation

## 2015-04-24 LAB — CBC WITH DIFFERENTIAL/PLATELET
Basophils Absolute: 0 10*3/uL (ref 0.0–0.1)
Basophils Relative: 0 % (ref 0–1)
Eosinophils Absolute: 0 10*3/uL (ref 0.0–0.7)
Eosinophils Relative: 0 % (ref 0–5)
HCT: 39.5 % (ref 39.0–52.0)
Hemoglobin: 13 g/dL (ref 13.0–17.0)
Lymphocytes Relative: 13 % (ref 12–46)
Lymphs Abs: 1.7 10*3/uL (ref 0.7–4.0)
MCH: 31 pg (ref 26.0–34.0)
MCHC: 32.9 g/dL (ref 30.0–36.0)
MCV: 94.3 fL (ref 78.0–100.0)
Monocytes Absolute: 0.8 10*3/uL (ref 0.1–1.0)
Monocytes Relative: 6 % (ref 3–12)
Neutro Abs: 10.9 10*3/uL — ABNORMAL HIGH (ref 1.7–7.7)
Neutrophils Relative %: 81 % — ABNORMAL HIGH (ref 43–77)
Platelets: 210 10*3/uL (ref 150–400)
RBC: 4.19 MIL/uL — ABNORMAL LOW (ref 4.22–5.81)
RDW: 12 % (ref 11.5–15.5)
WBC: 13.3 10*3/uL — ABNORMAL HIGH (ref 4.0–10.5)

## 2015-04-24 LAB — COMPREHENSIVE METABOLIC PANEL
ALT: 24 U/L (ref 17–63)
AST: 29 U/L (ref 15–41)
Albumin: 4.4 g/dL (ref 3.5–5.0)
Alkaline Phosphatase: 65 U/L (ref 38–126)
Anion gap: 11 (ref 5–15)
BUN: 7 mg/dL (ref 6–20)
CO2: 23 mmol/L (ref 22–32)
Calcium: 9.6 mg/dL (ref 8.9–10.3)
Chloride: 108 mmol/L (ref 101–111)
Creatinine, Ser: 1.01 mg/dL (ref 0.61–1.24)
GFR calc Af Amer: >60 mL/min (ref >60)
GFR calc non Af Amer: >60 mL/min (ref >60)
Glucose, Bld: 152 mg/dL — ABNORMAL HIGH (ref 65–99)
Potassium: 3.9 mmol/L (ref 3.5–5.1)
Sodium: 142 mmol/L (ref 135–145)
Total Bilirubin: 1.3 mg/dL — ABNORMAL HIGH (ref 0.3–1.2)
Total Protein: 7.7 g/dL (ref 6.5–8.1)

## 2015-04-24 LAB — LIPASE, BLOOD: Lipase: 16 U/L — ABNORMAL LOW (ref 22–51)

## 2015-04-24 LAB — ETHANOL: Alcohol, Ethyl (B): <5 mg/dL (ref <5)

## 2015-04-24 MED ORDER — ONDANSETRON HCL 4 MG/2ML IJ SOLN
4.0000 mg | Freq: Once | INTRAMUSCULAR | Status: AC
  Administered 2015-04-24: 4 mg via INTRAVENOUS
  Filled 2015-04-24: qty 2

## 2015-04-24 MED ORDER — SODIUM CHLORIDE 0.9 % IV SOLN: 1000.0000 mL | INTRAVENOUS | Status: DC

## 2015-04-24 MED ORDER — ONDANSETRON HCL 4 MG/2ML IJ SOLN: 4.0000 mg | Freq: Once | INTRAMUSCULAR | Status: DC

## 2015-04-24 MED ORDER — HYDROMORPHONE HCL 1 MG/ML IJ SOLN
1.0000 mg | Freq: Once | INTRAMUSCULAR | Status: AC
  Administered 2015-04-24: 1 mg via INTRAVENOUS
  Filled 2015-04-24: qty 1

## 2015-04-24 MED ORDER — SODIUM CHLORIDE 0.9 % IV SOLN
1000.0000 mL | Freq: Once | INTRAVENOUS | Status: AC
  Administered 2015-04-24: 1000 mL via INTRAVENOUS

## 2015-04-24 NOTE — ED Notes (Signed)
Pt here for abd pain, and lower chest pain, onset 5 pm, hx of gastroparesis, abd soft,

## 2015-04-24 NOTE — ED Notes (Signed)
Patient was given  of Zofran en route to ED by EMS.

## 2015-04-24 NOTE — ED Notes (Signed)
Patient to xray.

## 2015-04-24 NOTE — ED Provider Notes (Signed)
CSN: [id number]     Arrival date & time 04/24/15  2129 History  First MD Initiated Contact with Patient 04/24/15 2138     Chief Complaint  Patient presents with  . Abdominal Pain   Patient is a 34 y.o. male presenting with abdominal pain.  Abdominal Pain Pain location:  Epigastric Pain quality: sharp   Pain radiates to:  Does not radiate Pain severity:  Severe Onset quality:  Gradual Duration:  5 hours Timing:  Constant Progression:  Worsening Chronicity:  Recurrent Associated symptoms: vomiting   Associated symptoms: no diarrhea, no dysuria and no fever    patient has a history of polysubstance abuse, chronic abdominal pain and recurrent gastroparesis. He has been seen numerous times in the past for similar episodes of vomiting and abdominal pain. The patient denies any trouble with chest pain or shortness of breath.  Past Medical History  Diagnosis Date  . Asthma   . Chronic abdominal pain   . Nausea and vomiting     chronic, recurrent  . Polysubstance abuse   . Gastroparesis   . 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 GASTRITIS 12/09/2009    Qualifier: Diagnosis of  By: Daphine Deutscher FNP, Zena Amos     Past Surgical History  Procedure Laterality Date  . Cholecystectomy  11/2009   Family History  Problem Relation Age of Onset  . Other Father   . Diabetes Father   . Hypertension Father    Social History  Substance Use Topics  . Smoking status: Former Smoker -- 8 years    Types: Cigars    Quit date: 12/15/2014  . Smokeless tobacco: Never Used  . Alcohol Use: 0.0 oz/week    0 Standard drinks or equivalent per week     Comment: 12/16/2014 " I no Longer drink ,I quit about 2 years ago "    Review of Systems  Constitutional: Negative for fever.  Gastrointestinal: Positive for vomiting and abdominal pain. Negative for diarrhea.   Genitourinary: Negative for dysuria.  All other systems reviewed and are negative.     Allergies  Review of patient's allergies indicates no known allergies.  Home Medications   Prior to Admission medications   Medication Sig Start Date End Date Taking? Authorizing Provider  metoCLOPramide (REGLAN) 5 MG tablet Take 1 tablet (5 mg total) by mouth 3 (three) times daily before meals. 04/25/15   Linwood Dibbles, MD  omeprazole (PRILOSEC) 40 MG capsule Take 1 capsule (40 mg total) by mouth daily. Patient not taking: Reported on 04/24/2015 02/04/15   Ambrose Finland, NP  ondansetron (ZOFRAN ODT) 4 MG disintegrating tablet Take 1 tablet (4 mg total) by mouth every 8 (eight) hours as needed for nausea. 04/25/15   Linwood Dibbles, MD  polyethylene glycol [facility] Ethelene Hal) packet Take 17 g by mouth daily. Patient not taking: Reported on 04/24/2015 01/18/15   Maretta Bees, MD  zolpidem (AMBIEN) 5 MG tablet Take 1 tablet (5 mg total) by mouth at bedtime as needed for sleep. Patient not taking: Reported on 04/24/2015 01/18/15   Maretta Bees, MD   BP 152/89 mmHg  Pulse 79  Temp(Src) 98.3 F (36.8 C) (Oral)  Resp 22  SpO2 95% Physical Exam  Constitutional: He appears distressed.  HENT:  Head: Normocephalic and atraumatic.  Right Ear: External ear normal.  Left Ear: External ear normal.  Eyes: Conjunctivae are normal. Right eye exhibits no discharge. Left eye exhibits no discharge. No scleral icterus.  Neck: Neck supple. No tracheal deviation present.  Cardiovascular: Normal rate, regular rhythm and intact distal pulses.   Pulmonary/Chest: Effort normal and breath sounds normal. No stridor. No respiratory distress. He has no wheezes. He has no rales.  Abdominal: Soft. Bowel sounds are normal. He exhibits no distension. There is tenderness in the epigastric area. There is no rebound and no guarding.  Musculoskeletal: He exhibits no edema or tenderness.  Neurological: He is alert. He has normal strength.  No cranial nerve deficit (no facial droop, extraocular movements intact, no slurred speech) or sensory deficit. He exhibits normal muscle tone. He displays no seizure activity. Coordination normal.  Skin: Skin is warm. No rash noted.  Diaphoretic  Psychiatric: He has a normal mood and affect.  Nursing note and vitals reviewed.   ED Course  Procedures (including critical care time) Labs Review Labs Reviewed  CBC WITH DIFFERENTIAL/PLATELET - Abnormal; Notable for the following:    WBC 13.3 (*)    RBC 4.19 (*)    Neutrophils Relative % 81 (*)    Neutro Abs 10.9 (*)    All other components within normal limits  COMPREHENSIVE METABOLIC PANEL - Abnormal; Notable for the following:    Glucose, Bld 152 (*)    Total Bilirubin 1.3 (*)    All other components within normal limits  LIPASE, BLOOD - Abnormal; Notable for the following:    Lipase 16 (*)    All other components within normal limits  ETHANOL  URINE RAPID DRUG SCREEN, HOSP PERFORMED    Imaging Review Dg Abd Acute W/chest  04/24/2015   CLINICAL DATA:  34 year old male with abdominal pain and vomiting  EXAM: DG ABDOMEN ACUTE W/ 1V CHEST  COMPARISON:  Radiograph dated 01/15/2015  FINDINGS: Right upper quadrant cholecystectomy clips. There is no evidence of dilated bowel loops or free intraperitoneal air. No radiopaque calculi or other significant radiographic abnormality is seen. Heart size and mediastinal contours are within normal limits. Both lungs are clear.  IMPRESSION: Negative abdominal radiographs.  No acute cardiopulmonary disease.   Electronically Signed   By: Elgie Collard M.D.   On: 04/24/2015 23:10   I have personally reviewed and evaluated these images and lab results as part of my medical decision-making.   EKG Interpretation   Date/Time:  Friday April 24 2015 21:30:06 EDT Ventricular Rate:  53 PR Interval:  160 QRS Duration: 114 QT Interval:  431 QTC Calculation: 405 R Axis:   51 Text Interpretation:   Sinus rhythm Incomplete left bundle branch block ST  elevation suggests  early repolarization No significant change since last  tracing Confirmed by Florabel Faulks  MD-J, Damarian Priola ([id number]) on 04/24/2015 9:38:43 PM      MDM   Final diagnoses:  Gastroparesis   Pt improved with treatment.  No further vomiting.   Pt has history of recurrent episodes associated with gastroparesis.  There has been some concern that this may be related to his chronic marijuana use as well.  Will refill his reglan..  Dc home with zofran     Linwood Dibbles, MD 04/25/15 [phone]

## 2015-04-24 NOTE — ED Notes (Signed)
Patient returned from xray.

## 2015-04-25 ENCOUNTER — Emergency Department (HOSPITAL_COMMUNITY)
Admission: EM | Admit: 2015-04-25 | Discharge: 2015-04-25 | Discharge status: Against Medical Advice | Payer: Self-pay | Attending: Emergency Medicine | Admitting: Emergency Medicine

## 2015-04-25 ENCOUNTER — Encounter (HOSPITAL_COMMUNITY): Payer: Self-pay

## 2015-04-25 DIAGNOSIS — R1110 Vomiting, unspecified: Secondary | ICD-10-CM | POA: Insufficient documentation

## 2015-04-25 DIAGNOSIS — F1210 Cannabis abuse, uncomplicated: Secondary | ICD-10-CM | POA: Insufficient documentation

## 2015-04-25 DIAGNOSIS — G8929 Other chronic pain: Secondary | ICD-10-CM | POA: Insufficient documentation

## 2015-04-25 DIAGNOSIS — J45909 Unspecified asthma, uncomplicated: Secondary | ICD-10-CM | POA: Insufficient documentation

## 2015-04-25 LAB — COMPREHENSIVE METABOLIC PANEL
ALT: 25 U/L (ref 17–63)
AST: 28 U/L (ref 15–41)
Albumin: 4.4 g/dL (ref 3.5–5.0)
Alkaline Phosphatase: 65 U/L (ref 38–126)
Anion gap: 7 (ref 5–15)
BUN: 12 mg/dL (ref 6–20)
CO2: 26 mmol/L (ref 22–32)
Calcium: 9.6 mg/dL (ref 8.9–10.3)
Chloride: 106 mmol/L (ref 101–111)
Creatinine, Ser: 0.79 mg/dL (ref 0.61–1.24)
GFR calc Af Amer: >60 mL/min (ref >60)
GFR calc non Af Amer: >60 mL/min (ref >60)
Glucose, Bld: 142 mg/dL — ABNORMAL HIGH (ref 65–99)
Potassium: 3.8 mmol/L (ref 3.5–5.1)
Sodium: 139 mmol/L (ref 135–145)
Total Bilirubin: 1.6 mg/dL — ABNORMAL HIGH (ref 0.3–1.2)
Total Protein: 8.3 g/dL — ABNORMAL HIGH (ref 6.5–8.1)

## 2015-04-25 LAB — CBC
HCT: 37.2 % — ABNORMAL LOW (ref 39.0–52.0)
Hemoglobin: 12.5 g/dL — ABNORMAL LOW (ref 13.0–17.0)
MCH: 31.3 pg (ref 26.0–34.0)
MCHC: 33.6 g/dL (ref 30.0–36.0)
MCV: 93.2 fL (ref 78.0–100.0)
Platelets: 242 10*3/uL (ref 150–400)
RBC: 3.99 MIL/uL — ABNORMAL LOW (ref 4.22–5.81)
RDW: 12 % (ref 11.5–15.5)
WBC: 16.6 10*3/uL — ABNORMAL HIGH (ref 4.0–10.5)

## 2015-04-25 LAB — LIPASE, BLOOD: Lipase: 11 U/L — ABNORMAL LOW (ref 22–51)

## 2015-04-25 MED ORDER — METOCLOPRAMIDE HCL 5 MG PO TABS: 5.0000 mg | ORAL_TABLET | Freq: Three times a day (TID) | ORAL | Status: DC

## 2015-04-25 MED ORDER — ONDANSETRON 4 MG PO TBDP: 4.0000 mg | ORAL_TABLET | Freq: Three times a day (TID) | ORAL | Status: DC | PRN

## 2015-04-25 MED ORDER — IBUPROFEN 200 MG PO TABS
400.0000 mg | ORAL_TABLET | Freq: Once | ORAL | Status: AC
  Administered 2015-04-25: 400 mg via ORAL
  Filled 2015-04-25: qty 2

## 2015-04-25 NOTE — ED Notes (Signed)
Pt requesting pain med in lobby.  Protocol med given.

## 2015-04-25 NOTE — ED Notes (Signed)
Pt not in lobby when called

## 2015-04-25 NOTE — ED Notes (Signed)
Per PTAR, Pt, from home, c/o n/v starting last night.  Denies pain.  Pt was seen at [facility] yesterday for same and prescribed Reglan.  Pt reports taking medication w/o relief.  Hx of cannabis hyperemesis syndrome.  Last marijuana use was yesterday.

## 2015-04-25 NOTE — Discharge Instructions (Signed)

## 2015-04-27 ENCOUNTER — Encounter (HOSPITAL_COMMUNITY): Payer: Self-pay

## 2015-04-27 ENCOUNTER — Emergency Department (HOSPITAL_COMMUNITY)
Admission: EM | Admit: 2015-04-27 | Discharge: 2015-04-27 | Disposition: A | Discharge status: Home / Self Care | Payer: Self-pay | Attending: Emergency Medicine | Admitting: Emergency Medicine

## 2015-04-27 DIAGNOSIS — Z87891 Personal history of nicotine dependence: Secondary | ICD-10-CM | POA: Insufficient documentation

## 2015-04-27 DIAGNOSIS — R1115 Cyclical vomiting syndrome unrelated to migraine: Secondary | ICD-10-CM

## 2015-04-27 DIAGNOSIS — J45909 Unspecified asthma, uncomplicated: Secondary | ICD-10-CM | POA: Insufficient documentation

## 2015-04-27 DIAGNOSIS — F1110 Opioid abuse, uncomplicated: Secondary | ICD-10-CM | POA: Insufficient documentation

## 2015-04-27 DIAGNOSIS — K3184 Gastroparesis: Secondary | ICD-10-CM

## 2015-04-27 DIAGNOSIS — F1210 Cannabis abuse, uncomplicated: Secondary | ICD-10-CM | POA: Insufficient documentation

## 2015-04-27 DIAGNOSIS — G43A1 Cyclical vomiting, intractable: Secondary | ICD-10-CM | POA: Insufficient documentation

## 2015-04-27 DIAGNOSIS — N390 Urinary tract infection, site not specified: Secondary | ICD-10-CM

## 2015-04-27 DIAGNOSIS — G8929 Other chronic pain: Secondary | ICD-10-CM | POA: Insufficient documentation

## 2015-04-27 DIAGNOSIS — Z8619 Personal history of other infectious and parasitic diseases: Secondary | ICD-10-CM | POA: Insufficient documentation

## 2015-04-27 LAB — CBC WITH DIFFERENTIAL/PLATELET
Basophils Absolute: 0 K/uL (ref 0.0–0.1)
Basophils Relative: 0 % (ref 0–1)
Eosinophils Absolute: 0 K/uL (ref 0.0–0.7)
Eosinophils Relative: 0 % (ref 0–5)
HCT: 39.7 % (ref 39.0–52.0)
Hemoglobin: 13 g/dL (ref 13.0–17.0)
Lymphocytes Relative: 9 % — ABNORMAL LOW (ref 12–46)
Lymphs Abs: 1 K/uL (ref 0.7–4.0)
MCH: 30.8 pg (ref 26.0–34.0)
MCHC: 32.7 g/dL (ref 30.0–36.0)
MCV: 94.1 fL (ref 78.0–100.0)
Monocytes Absolute: 0.5 K/uL (ref 0.1–1.0)
Monocytes Relative: 4 % (ref 3–12)
Neutro Abs: 9.3 K/uL — ABNORMAL HIGH (ref 1.7–7.7)
Neutrophils Relative %: 87 % — ABNORMAL HIGH (ref 43–77)
Platelets: 234 K/uL (ref 150–400)
RBC: 4.22 MIL/uL (ref 4.22–5.81)
RDW: 12 % (ref 11.5–15.5)
WBC: 10.8 K/uL — ABNORMAL HIGH (ref 4.0–10.5)

## 2015-04-27 LAB — COMPREHENSIVE METABOLIC PANEL WITH GFR
ALT: 29 U/L (ref 17–63)
AST: 35 U/L (ref 15–41)
Albumin: 4.2 g/dL (ref 3.5–5.0)
Alkaline Phosphatase: 60 U/L (ref 38–126)
Anion gap: 11 (ref 5–15)
BUN: 14 mg/dL (ref 6–20)
CO2: 28 mmol/L (ref 22–32)
Calcium: 9.2 mg/dL (ref 8.9–10.3)
Chloride: 101 mmol/L (ref 101–111)
Creatinine, Ser: 1 mg/dL (ref 0.61–1.24)
GFR calc Af Amer: >60 mL/min
GFR calc non Af Amer: >60 mL/min
Glucose, Bld: 152 mg/dL — ABNORMAL HIGH (ref 65–99)
Potassium: 3.3 mmol/L — ABNORMAL LOW (ref 3.5–5.1)
Sodium: 140 mmol/L (ref 135–145)
Total Bilirubin: 2.3 mg/dL — ABNORMAL HIGH (ref 0.3–1.2)
Total Protein: 7.7 g/dL (ref 6.5–8.1)

## 2015-04-27 LAB — RAPID URINE DRUG SCREEN, HOSP PERFORMED
Amphetamines: NOT DETECTED
Barbiturates: NOT DETECTED
Benzodiazepines: NOT DETECTED
Cocaine: NOT DETECTED
Opiates: POSITIVE — AB
Tetrahydrocannabinol: POSITIVE — AB

## 2015-04-27 LAB — URINALYSIS, ROUTINE W REFLEX MICROSCOPIC
Bilirubin Urine: NEGATIVE
Glucose, UA: 100 mg/dL — AB
Hgb urine dipstick: NEGATIVE
Ketones, ur: 15 mg/dL — AB
Nitrite: NEGATIVE
Protein, ur: NEGATIVE mg/dL
Specific Gravity, Urine: 1.025 (ref 1.005–1.030)
Urobilinogen, UA: 1 mg/dL (ref 0.0–1.0)
pH: 6 (ref 5.0–8.0)

## 2015-04-27 LAB — URINE MICROSCOPIC-ADD ON

## 2015-04-27 LAB — LIPASE, BLOOD: Lipase: 14 U/L — ABNORMAL LOW (ref 22–51)

## 2015-04-27 MED ORDER — MORPHINE SULFATE (PF) 4 MG/ML IV SOLN
4.0000 mg | Freq: Once | INTRAVENOUS | Status: AC
  Administered 2015-04-27: 4 mg via INTRAVENOUS
  Filled 2015-04-27: qty 1

## 2015-04-27 MED ORDER — ONDANSETRON 4 MG PO TBDP: 4.0000 mg | ORAL_TABLET | Freq: Three times a day (TID) | ORAL | Status: DC | PRN

## 2015-04-27 MED ORDER — CIPROFLOXACIN HCL 500 MG PO TABS: 500.0000 mg | ORAL_TABLET | Freq: Two times a day (BID) | ORAL | Status: DC

## 2015-04-27 MED ORDER — SODIUM CHLORIDE 0.9 % IV BOLUS (SEPSIS)
1000.0000 mL | Freq: Once | INTRAVENOUS | Status: AC
  Administered 2015-04-27: 1000 mL via INTRAVENOUS

## 2015-04-27 MED ORDER — ONDANSETRON HCL 4 MG/2ML IJ SOLN
4.0000 mg | Freq: Once | INTRAMUSCULAR | Status: AC
  Administered 2015-04-27: 4 mg via INTRAVENOUS
  Filled 2015-04-27: qty 2

## 2015-04-27 NOTE — ED Notes (Signed)
Pt given urinal and is aware of need for urine specimen. 

## 2015-04-27 NOTE — ED Notes (Signed)
Bed: WA03 Expected date:  Expected time:  Means of arrival:  Comments: Ems-abdominal pain, nausea

## 2015-04-27 NOTE — ED Notes (Signed)
Pt comes in today with a c/o right upper quad abdominal pain. Pt states this has been going on for 2 days. Pt has a hx of gastroparesis.

## 2015-04-27 NOTE — ED Provider Notes (Signed)
CSN: [id number]     Arrival date & time 04/27/15  4098 History   First MD Initiated Contact with Patient 04/27/15 [phone]     Chief Complaint  Patient presents with  . Abdominal Pain     (Consider location/radiation/quality/duration/timing/severity/associated sxs/prior Treatment) HPI Comments: Matthew Scott is a 34 y.o M with a long-term history of gastroparesis and cannabis hyperemesis syndrome presents to the emergency department today complaining of intractable vomiting and epigastric abdominal pain. Emesis is nonbloody and nonbilious. Patient seen in the emergency department 2 days ago for similar symptoms was given Zofran and had his Reglan refilled. Patient states he has been taking Reglan with no relief. He is still using marijuana. Denies fever, chills, back pain, dysuria, hematuria, chest pain, shortness of breath, diarrhea, numbness, weakness.  Patient is a 34 y.o. male presenting with abdominal pain. The history is provided by the patient.  Abdominal Pain   Past Medical History  Diagnosis Date  . Asthma   . Chronic abdominal pain   . Nausea and vomiting     chronic, recurrent  . Polysubstance abuse   . Gastroparesis   . 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 GASTRITIS 12/09/2009    Qualifier: Diagnosis of  By: Daphine Deutscher FNP, Zena Amos     Past Surgical History  Procedure Laterality Date  . Cholecystectomy  11/2009   Family History  Problem Relation Age of Onset  . Other Father   . Diabetes Father   . Hypertension Father    Social History  Substance Use Topics  . Smoking status: Former Smoker -- 8 years    Types: Cigars    Quit date: 12/15/2014  . Smokeless tobacco: Never Used  . Alcohol Use: 0.0 oz/week    0 Standard drinks or equivalent per week     Comment: 12/16/2014 " I no Longer drink ,I quit about 2 years ago "     Review of Systems  Gastrointestinal: Positive for abdominal pain.  All other systems reviewed and are negative.     Allergies  Review of patient's allergies indicates no known allergies.  Home Medications   Prior to Admission medications   Medication Sig Start Date End Date Taking? Authorizing Provider  metoCLOPramide (REGLAN) 5 MG tablet Take 1 tablet (5 mg total) by mouth 3 (three) times daily before meals. 04/25/15  Yes Linwood Dibbles, MD  ciprofloxacin (CIPRO) 500 MG tablet Take 1 tablet (500 mg total) by mouth 2 (two) times daily. 04/27/15   Eriverto Byrnes Tripp Azhia Siefken, PA-C  omeprazole (PRILOSEC) 40 MG capsule Take 1 capsule (40 mg total) by mouth daily. Patient not taking: Reported on 04/24/2015 02/04/15   Ambrose Finland, NP  ondansetron (ZOFRAN ODT) 4 MG disintegrating tablet Take 1 tablet (4 mg total) by mouth every 8 (eight) hours as needed for nausea. 04/27/15   Deshia Vanderhoof Tripp Keyry Iracheta, PA-C  polyethylene glycol (MIRALAX / GLYCOLAX) packet Take 17 g by mouth daily. Patient not taking: Reported on 04/24/2015 01/18/15   Maretta Bees, MD  zolpidem (AMBIEN) 5 MG tablet Take 1 tablet (5 mg total) by mouth at bedtime as needed for sleep. Patient not taking: Reported on 04/24/2015 01/18/15   Maretta Bees, MD   BP 153/89 mmHg  Pulse 56  Temp(Src) 98.5 F (36.9 C) (Oral)  Resp 16  SpO2 97% Physical Exam  Constitutional: He is oriented to person, place, and time. He appears well-developed and well-nourished. No distress.  HENT:  Head: Normocephalic and atraumatic.  Mouth/Throat: Oropharynx is clear and moist. No oropharyngeal exudate.  Eyes: Conjunctivae and EOM are normal. Pupils are equal, round, and reactive to light. Right eye exhibits no discharge. Left eye exhibits no discharge. No scleral icterus.  Cardiovascular: Normal rate, regular rhythm, normal heart sounds and intact distal pulses.  Exam reveals no gallop and no friction rub.   No murmur heard. Pulmonary/Chest: Effort  normal and breath sounds normal. No respiratory distress. He has no wheezes. He has no rales. He exhibits no tenderness.  Abdominal: Soft. Bowel sounds are normal. He exhibits no distension. There is tenderness. There is guarding.  Pt guarding, does not want abdominal exam. Exquisite TTP of epigastric region. Laparoscopic cholecystectomy scars present.   Musculoskeletal: Normal range of motion. He exhibits no edema or tenderness.  Neurological: He is alert and oriented to person, place, and time. No cranial nerve deficit.  Strength 5/5 throughout. No sensory deficits.  No gait abnormality  Skin: Skin is warm and dry. No rash noted. He is not diaphoretic. No erythema. No pallor.  Psychiatric: He has a normal mood and affect. His behavior is normal.  Nursing note and vitals reviewed.   ED Course  Procedures (including critical care time)   Labs Review Labs Reviewed  CBC WITH DIFFERENTIAL/PLATELET - Abnormal; Notable for the following:    WBC 10.8 (*)    Neutrophils Relative % 87 (*)    Neutro Abs 9.3 (*)    Lymphocytes Relative 9 (*)    All other components within normal limits  LIPASE, BLOOD - Abnormal; Notable for the following:    Lipase 14 (*)    All other components within normal limits  COMPREHENSIVE METABOLIC PANEL - Abnormal; Notable for the following:    Potassium 3.3 (*)    Glucose, Bld 152 (*)    Total Bilirubin 2.3 (*)    All other components within normal limits  URINALYSIS, ROUTINE W REFLEX MICROSCOPIC (NOT AT [facility]) - Abnormal; Notable for the following:    Color, Urine AMBER (*)    Glucose, UA 100 (*)    Ketones, ur 15 (*)    Leukocytes, UA SMALL (*)    All other components within normal limits  URINE RAPID DRUG SCREEN, HOSP PERFORMED - Abnormal; Notable for the following:    Opiates POSITIVE (*)    Tetrahydrocannabinol POSITIVE (*)    All other components within normal limits  URINE MICROSCOPIC-ADD ON    Imaging Review No results found. I have personally  reviewed and evaluated these images and lab results as part of my medical decision-making.   EKG Interpretation None      MDM   Final diagnoses:  UTI (lower urinary tract infection)  Gastroparesis  Intractable cyclical vomiting with nausea    Pt given IV fluids, zofran and morphine. States he feels a little better. Small UTI found in urine. Will treat with cipro. Recommend follow up with GI. Avoid marijuana usage. VSS. Pt stable for discharge. Return precautions outlined in patient discharge instructions.   Patient was discussed with and seen by Dr. Adriana Simas who agrees with the treatment plan.      Lester Kinsman Huttig, PA-C 04/27/15 1711  Donnetta Hutching, MD 04/28/15 1246

## 2015-04-27 NOTE — Discharge Instructions (Signed)
Gastroparesis  Gastroparesis is also called slowed stomach emptying (delayed gastric emptying). It is a condition in which the stomach takes too long to empty its contents. It often happens in people with diabetes.  CAUSES  Gastroparesis happens when nerves to the stomach are damaged or stop working. When the nerves are damaged, the muscles of the stomach and intestines do not work normally. The movement of food is slowed or stopped. High blood glucose (sugar) causes changes in nerves and can damage the blood vessels that carry oxygen and nutrients to the nerves. RISK FACTORS  Diabetes.  Post-viral syndromes.  Eating disorders (anorexia, bulimia).  Surgery on the stomach or vagus nerve.  Gastroesophageal reflux disease (rarely).  Smooth muscle disorders (amyloidosis, scleroderma).  Metabolic disorders, including hypothyroidism.  Parkinson disease. SYMPTOMS   Heartburn.  Feeling sick to your stomach (nausea).  Vomiting of undigested food.  An early feeling of fullness when eating.  Weight loss.  Abdominal bloating.  Erratic blood glucose levels.  Lack of appetite.  Gastroesophageal reflux.  Spasms of the stomach wall. Complications can include:  Bacterial overgrowth in stomach. Food stays in the stomach and can ferment and cause bacteria to grow.  Weight loss due to difficulty digesting and absorbing nutrients.  Vomiting.  Obstruction in the stomach. Undigested food can harden and cause nausea and vomiting.  Blood glucose fluctuations caused by inconsistent food absorption. DIAGNOSIS  The diagnosis of gastroparesis is confirmed through one or more of the following tests:  Barium X-rays and scans. These tests look at how long it takes for food to move through the stomach.  Gastric manometry. This test measures electrical and muscular activity in the stomach. A thin tube is passed down the throat into the stomach. The tube contains a wire that takes measurements  of the stomach's electrical and muscular activity as it digests liquids and solid food.  Endoscopy. This procedure is done with a long, thin tube called an endoscope. It is passed through the mouth and gently down the esophagus into the stomach. This tube helps the caregiver look at the lining of the stomach to check for any abnormalities.  Ultrasonography. This can rule out gallbladder disease or pancreatitis. This test will outline and define the shape of the gallbladder and pancreas. TREATMENT   Treatments may include:  Exercise.  Medicines to control nausea and vomiting.  Medicines to stimulate stomach muscles.  Changes in what and when you eat.  Having smaller meals more often.  Eating low-fiber forms of high-fiber foods, such as eating cooked vegetables instead of raw vegetables.  Eating low-fat foods.  Consuming liquids, which are easier to digest.  In severe cases, feeding tubes and intravenous (IV) feeding may be needed. It is important to note that in most cases, treatment does not cure gastroparesis. It is usually a lasting (chronic) condition. Treatment helps you manage the underlying condition so that you can be as healthy and comfortable as possible. Other treatments  A gastric neurostimulator has been developed to assist people with gastroparesis. The battery-operated device is surgically implanted. It emits mild electrical pulses to help improve stomach emptying and to control nausea and vomiting.  The use of botulinum toxin has been shown to improve stomach emptying by decreasing the prolonged contractions of the muscle between the stomach and the small intestine (pyloric sphincter). The benefits are temporary. SEEK MEDICAL CARE IF:   You have diabetes and you are having problems keeping your blood glucose in goal range.  You are having nausea,  vomiting, bloating, or early feelings of fullness with eating.  Your symptoms do not change with a change in  diet. Document Released: 08/01/2005 Document Revised: 11/26/2012 Document Reviewed: 01/08/2009 [facility] Patient Information 2015 [place], [place]. This information is not intended to replace advice given to you by your health care provider. Make sure you discuss any questions you have with your health care provider.  Urinary Tract Infection Urinary tract infections (UTIs) can develop anywhere along your urinary tract. Your urinary tract is your body's drainage system for removing wastes and extra water. Your urinary tract includes two kidneys, two ureters, a bladder, and a urethra. Your kidneys are a pair of bean-shaped organs. Each kidney is about the size of your fist. They are located below your ribs, one on each side of your spine. CAUSES Infections are caused by microbes, which are microscopic organisms, including fungi, viruses, and bacteria. These organisms are so small that they can only be seen through a microscope. Bacteria are the microbes that most commonly cause UTIs. SYMPTOMS  Symptoms of UTIs may vary by age and gender of the patient and by the location of the infection. Symptoms in young women typically include a frequent and intense urge to urinate and a painful, burning feeling in the bladder or urethra during urination. Older women and men are more likely to be tired, shaky, and weak and have muscle aches and abdominal pain. A fever may mean the infection is in your kidneys. Other symptoms of a kidney infection include pain in your back or sides below the ribs, nausea, and vomiting. DIAGNOSIS To diagnose a UTI, your caregiver will ask you about your symptoms. Your caregiver also will ask to provide a urine sample. The urine sample will be tested for bacteria and white blood cells. White blood cells are made by your body to help fight infection. TREATMENT  Typically, UTIs can be treated with medication. Because most UTIs are caused by a bacterial infection, they usually can be treated  with the use of antibiotics. The choice of antibiotic and length of treatment depend on your symptoms and the type of bacteria causing your infection. HOME CARE INSTRUCTIONS  If you were prescribed antibiotics, take them exactly as your caregiver instructs you. Finish the medication even if you feel better after you have only taken some of the medication.  Drink enough water and fluids to keep your urine clear or pale yellow.  Avoid caffeine, tea, and carbonated beverages. They tend to irritate your bladder.  Empty your bladder often. Avoid holding urine for long periods of time.  Empty your bladder before and after sexual intercourse.  After a bowel movement, women should cleanse from front to back. Use each tissue only once. SEEK MEDICAL CARE IF:   You have back pain.  You develop a fever.  Your symptoms do not begin to resolve within 3 days. SEEK IMMEDIATE MEDICAL CARE IF:   You have severe back pain or lower abdominal pain.  You develop chills.  You have nausea or vomiting.  You have continued burning or discomfort with urination. MAKE SURE YOU:   Understand these instructions.  Will watch your condition.  Will get help right away if you are not doing well or get worse. Document Released: 05/11/2005 Document Revised: 01/31/2012 Document Reviewed: 09/09/2011 [facility] Patient Information 2015 [place], [place]. This information is not intended to replace advice given to you by your health care provider. Make sure you discuss any questions you have with your health care provider.  Follow up with GI as soon as possible for long term management of gastroparesis. Return to ED if fevers, chills, shortness of breath, loss of consciousness, bloody vomit, occur. Avoid marijuana usage. Take Reglan as prescribed. Stay hydrated, drink plenty of fluids.

## 2015-04-27 NOTE — ED Notes (Signed)
Bib ems c/o 2 day Hx ruq pain and vomiting.

## 2015-04-28 ENCOUNTER — Emergency Department (HOSPITAL_COMMUNITY)
Admission: EM | Admit: 2015-04-28 | Discharge: 2015-04-28 | Disposition: A | Discharge status: Home / Self Care | Payer: Self-pay | Attending: Physician Assistant | Admitting: Physician Assistant

## 2015-04-28 ENCOUNTER — Encounter (HOSPITAL_COMMUNITY): Payer: Self-pay | Admitting: Emergency Medicine

## 2015-04-28 DIAGNOSIS — Z87891 Personal history of nicotine dependence: Secondary | ICD-10-CM | POA: Insufficient documentation

## 2015-04-28 DIAGNOSIS — J45909 Unspecified asthma, uncomplicated: Secondary | ICD-10-CM | POA: Insufficient documentation

## 2015-04-28 DIAGNOSIS — Z79899 Other long term (current) drug therapy: Secondary | ICD-10-CM | POA: Insufficient documentation

## 2015-04-28 DIAGNOSIS — G8929 Other chronic pain: Secondary | ICD-10-CM | POA: Insufficient documentation

## 2015-04-28 DIAGNOSIS — K3184 Gastroparesis: Secondary | ICD-10-CM | POA: Insufficient documentation

## 2015-04-28 DIAGNOSIS — Z8619 Personal history of other infectious and parasitic diseases: Secondary | ICD-10-CM | POA: Insufficient documentation

## 2015-04-28 DIAGNOSIS — R112 Nausea with vomiting, unspecified: Secondary | ICD-10-CM | POA: Insufficient documentation

## 2015-04-28 DIAGNOSIS — Z113 Encounter for screening for infections with a predominantly sexual mode of transmission: Secondary | ICD-10-CM | POA: Insufficient documentation

## 2015-04-28 MED ORDER — ONDANSETRON HCL 4 MG PO TABS: 4.0000 mg | ORAL_TABLET | Freq: Three times a day (TID) | ORAL | Status: DC | PRN

## 2015-04-28 MED ORDER — ONDANSETRON HCL 4 MG PO TABS
4.0000 mg | ORAL_TABLET | Freq: Once | ORAL | Status: AC
  Administered 2015-04-28: 4 mg via ORAL
  Filled 2015-04-28: qty 1

## 2015-04-28 MED ORDER — AZITHROMYCIN 250 MG PO TABS
1000.0000 mg | ORAL_TABLET | Freq: Once | ORAL | Status: AC
  Administered 2015-04-28: 1000 mg via ORAL
  Filled 2015-04-28: qty 4

## 2015-04-28 MED ORDER — METOCLOPRAMIDE HCL 10 MG PO TABS
10.0000 mg | ORAL_TABLET | Freq: Once | ORAL | Status: AC
  Administered 2015-04-28: 10 mg via ORAL
  Filled 2015-04-28: qty 1

## 2015-04-28 MED ORDER — CEFTRIAXONE SODIUM 250 MG IJ SOLR
250.0000 mg | Freq: Once | INTRAMUSCULAR | Status: AC
  Administered 2015-04-28: 250 mg via INTRAMUSCULAR
  Filled 2015-04-28: qty 250

## 2015-04-28 MED ORDER — LIDOCAINE HCL (PF) 1 % IJ SOLN
2.0000 mL | Freq: Once | INTRAMUSCULAR | Status: AC
  Administered 2015-04-28: 2 mL
  Filled 2015-04-28: qty 5

## 2015-04-28 NOTE — Discharge Instructions (Signed)

## 2015-04-28 NOTE — ED Provider Notes (Addendum)
CSN: [id number]     Arrival date & time 04/28/15  0428 History   First MD Initiated Contact with Patient 04/28/15 0818     Chief Complaint  Patient presents with  . Emesis    "withdrawing from marijuana x 5 days"  . std check      (Consider location/radiation/quality/duration/timing/severity/associated sxs/prior Treatment) HPI   Patient is a 34 year old male with history of gastric paresis and cannabis hyperemesis presenting today with nausea vomiting. She didn't have his usual nausea. He isn't seen multiple times by providers recently. He was given Zofran but did not fill it because it was too expensive. Patient also had a phone call from a girlfriend who said that she had Chlamydia. Here for an STD check.  Past Medical History  Diagnosis Date  . Asthma   . Chronic abdominal pain   . Nausea and vomiting     chronic, recurrent  . Polysubstance abuse   . Gastroparesis   . 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 GASTRITIS 12/09/2009    Qualifier: Diagnosis of  By: Daphine Deutscher FNP, Zena Amos     Past Surgical History  Procedure Laterality Date  . Cholecystectomy  11/2009   Family History  Problem Relation Age of Onset  . Other Father   . Diabetes Father   . Hypertension Father    Social History  Substance Use Topics  . Smoking status: Former Smoker -- 8 years    Types: Cigars    Quit date: 12/15/2014  . Smokeless tobacco: Never Used  . Alcohol Use: 0.0 oz/week    0 Standard drinks or equivalent per week     Comment: 12/16/2014 " I no Longer drink ,I quit about 2 years ago "    Review of Systems  Constitutional: Negative for fever and activity change.  HENT: Negative for drooling and hearing loss.   Eyes: Negative for discharge and redness.  Respiratory: Negative for cough and shortness of breath.   Cardiovascular: Negative for chest  pain.  Gastrointestinal: Positive for nausea and abdominal pain.  Genitourinary: Negative for dysuria, urgency, hematuria, flank pain, discharge, difficulty urinating and penile pain.  Musculoskeletal: Negative for arthralgias.  Allergic/Immunologic: Negative for immunocompromised state.  Neurological: Negative for seizures and speech difficulty.  Psychiatric/Behavioral: Negative for behavioral problems and agitation.  All other systems reviewed and are negative.     Allergies  Review of patient's allergies indicates no known allergies.  Home Medications   Prior to Admission medications   Medication Sig Start Date End Date Taking? Authorizing Provider  metoCLOPramide (REGLAN) 5 MG tablet Take 1 tablet (5 mg total) by mouth 3 (three) times daily before meals. 04/25/15  Yes Linwood Dibbles, MD  ondansetron (ZOFRAN ODT) 4 MG disintegrating tablet Take 1 tablet (4 mg total) by mouth every 8 (eight) hours as needed for nausea. 04/27/15  Yes Samantha Tripp Dowless, PA-C  ciprofloxacin (CIPRO) 500 MG tablet Take 1 tablet (500 mg total) by mouth 2 (two) times daily. Patient not taking: Reported on 04/28/2015 04/27/15   Samantha Tripp Dowless, PA-C  omeprazole (PRILOSEC) 40 MG capsule Take 1 capsule (40 mg total) by mouth daily. Patient not taking: Reported on 04/24/2015 02/04/15   Ambrose Finland, NP  polyethylene glycol (MIRALAX / GLYCOLAX) packet Take 17 g by mouth daily. Patient not taking: Reported on 04/24/2015 01/18/15  Shanker Levora Dredge, MD  zolpidem (AMBIEN) 5 MG tablet Take 1 tablet (5 mg total) by mouth at bedtime as needed for sleep. Patient not taking: Reported on 04/24/2015 01/18/15   Maretta Bees, MD   BP 172/75 mmHg  Pulse 61  Temp(Src) 99 F (37.2 C) (Oral)  Resp 20  SpO2 98% Physical Exam  Constitutional: He is oriented to person, place, and time. He appears well-nourished.  HENT:  Head: Normocephalic.  Mouth/Throat: Oropharynx is clear and moist.  Eyes: Conjunctivae are normal.   Neck: No tracheal deviation present.  Cardiovascular: Normal rate.   Pulmonary/Chest: Effort normal. No stridor. No respiratory distress.  Abdominal: Soft. There is no tenderness. There is no guarding.  Genitourinary: Penis normal.  Musculoskeletal: Normal range of motion. He exhibits no edema.  Neurological: He is oriented to person, place, and time. No cranial nerve deficit.  Skin: Skin is warm and dry. No rash noted. He is not diaphoretic.  Psychiatric: He has a normal mood and affect. His behavior is normal.  Nursing note and vitals reviewed.   ED Course  Procedures (including critical care time) Labs Review Labs Reviewed  RPR  HIV ANTIBODY (ROUTINE TESTING)  GC/CHLAMYDIA PROBE AMP ([place]) NOT AT [facility]    Imaging Review No results found. I have personally reviewed and evaluated these images and lab results as part of my medical decision-making.   EKG Interpretation None      MDM   Final diagnoses:  None    Patient is a 34 year old male presenting with nausea which is chronic in nature. Patient has history of cannabis hyperemesis and gastric paresis. The symptoms are no different today. Patient is also here because he had a girlfriend called and say she has Chlaymydia. . Patient has no evidence of lesions on his hands or penis. We will treat empirically and test for RPR and HIV  Patient has moist mucous memory is normal lateral signs normal physical exam.  Nithin Demeo Randall An, MD 04/28/15 0844  9:42 AM Patient was laughing on the phone and making phone calls. In there with a friend laughing. When pt wold he was being discharged patient started dry heaving. We will give him one more Reglan. However patient is well-known to this emergency department andhe will likely continue to have emesis until he stops smoking weed. He's been informed of this and states he will  try. With normal vital signs and normal physical exam I do not think that is necessary to further  investigative testing.  Zanobia Griebel Randall An, MD 04/28/15 [phone]

## 2015-04-28 NOTE — ED Notes (Signed)
Patient states his nausea and pain is worse and he "can't keep nothing down". Patient states he was given Rx for pain and nausea medications but did not get them filled. Patient then states he got the nausea medication filled, last taken at 2200. Patient then asked for STD check. Patient became verbal abusive towards this nurse and cursing. Security called when patient would not lower his voice. Patient IV removed, patient was advised this was done as he was going to be placed in the lobby to await a treatment room.

## 2015-04-28 NOTE — ED Notes (Signed)
Per EMS, patient c/o "withdrawing from marijuana" x 5 days. Patient c/o generalized abd pain, nausea, and vomiting. Patient was given  IV Zofran by EMS. Patient seen here for same complaint yesterday. Patient states his last marijuana use was 5 das ago.

## 2015-04-29 LAB — HIV ANTIBODY (ROUTINE TESTING W REFLEX): HIV Screen 4th Generation wRfx: NONREACTIVE

## 2015-04-29 LAB — RPR: RPR Ser Ql: NONREACTIVE

## 2015-04-29 LAB — GC/CHLAMYDIA PROBE AMP (~~LOC~~) NOT AT ARMC
Chlamydia: POSITIVE — AB
Neisseria Gonorrhea: NEGATIVE

## 2015-04-30 ENCOUNTER — Telehealth (HOSPITAL_BASED_OUTPATIENT_CLINIC_OR_DEPARTMENT_OTHER): Payer: Self-pay | Admitting: Emergency Medicine

## 2015-05-02 ENCOUNTER — Telehealth (HOSPITAL_COMMUNITY): Payer: Self-pay | Admitting: Emergency Medicine

## 2015-05-03 ENCOUNTER — Telehealth (HOSPITAL_COMMUNITY): Payer: Self-pay | Admitting: Emergency Medicine

## 2015-05-03 NOTE — Telephone Encounter (Signed)
Post ED Visit - Positive Culture Follow-up: Unsuccessful Patient Follow-up  Positive Chlamydia culture   Patient discharged without antimicrobial prescription and treatment is now indicated  Organism is resistant to prescribed ED discharge antimicrobial  Patient with positive blood cultures   Unable to contact patient after 3 attempts, letter will be sent to address on file  Jiles Harold 05/03/2015, 5:34 PM

## 2015-06-20 ENCOUNTER — Telehealth (HOSPITAL_COMMUNITY): Payer: Self-pay

## 2015-06-20 NOTE — Telephone Encounter (Signed)
Unable to contact pt by mail or telephone. Unable to communicate lab results or treatment changes. 

## 2015-12-09 ENCOUNTER — Encounter (HOSPITAL_COMMUNITY): Payer: Self-pay | Admitting: Emergency Medicine

## 2015-12-09 ENCOUNTER — Emergency Department (HOSPITAL_COMMUNITY)
Admission: EM | Admit: 2015-12-09 | Discharge: 2015-12-09 | Disposition: A | Discharge status: Home / Self Care | Payer: Self-pay | Attending: Emergency Medicine | Admitting: Emergency Medicine

## 2015-12-09 ENCOUNTER — Emergency Department (HOSPITAL_COMMUNITY): Payer: Self-pay

## 2015-12-09 DIAGNOSIS — R109 Unspecified abdominal pain: Secondary | ICD-10-CM

## 2015-12-09 DIAGNOSIS — R1031 Right lower quadrant pain: Secondary | ICD-10-CM | POA: Insufficient documentation

## 2015-12-09 DIAGNOSIS — G43A0 Cyclical vomiting, not intractable: Secondary | ICD-10-CM | POA: Insufficient documentation

## 2015-12-09 DIAGNOSIS — G8929 Other chronic pain: Secondary | ICD-10-CM | POA: Insufficient documentation

## 2015-12-09 DIAGNOSIS — Z9049 Acquired absence of other specified parts of digestive tract: Secondary | ICD-10-CM | POA: Insufficient documentation

## 2015-12-09 DIAGNOSIS — Z8619 Personal history of other infectious and parasitic diseases: Secondary | ICD-10-CM | POA: Insufficient documentation

## 2015-12-09 DIAGNOSIS — J45909 Unspecified asthma, uncomplicated: Secondary | ICD-10-CM | POA: Insufficient documentation

## 2015-12-09 DIAGNOSIS — R1115 Cyclical vomiting syndrome unrelated to migraine: Secondary | ICD-10-CM

## 2015-12-09 DIAGNOSIS — Z8719 Personal history of other diseases of the digestive system: Secondary | ICD-10-CM | POA: Insufficient documentation

## 2015-12-09 DIAGNOSIS — Z87891 Personal history of nicotine dependence: Secondary | ICD-10-CM | POA: Insufficient documentation

## 2015-12-09 DIAGNOSIS — F1210 Cannabis abuse, uncomplicated: Secondary | ICD-10-CM | POA: Insufficient documentation

## 2015-12-09 LAB — URINALYSIS, ROUTINE W REFLEX MICROSCOPIC
Bilirubin Urine: NEGATIVE
Glucose, UA: 500 mg/dL — AB
Hgb urine dipstick: NEGATIVE
Ketones, ur: NEGATIVE mg/dL
Leukocytes, UA: NEGATIVE
Nitrite: NEGATIVE
Protein, ur: NEGATIVE mg/dL
Specific Gravity, Urine: 1.027 (ref 1.005–1.030)
pH: 5.5 (ref 5.0–8.0)

## 2015-12-09 LAB — COMPREHENSIVE METABOLIC PANEL
ALT: 19 U/L (ref 17–63)
AST: 24 U/L (ref 15–41)
Albumin: 4.6 g/dL (ref 3.5–5.0)
Alkaline Phosphatase: 68 U/L (ref 38–126)
Anion gap: 13 (ref 5–15)
BUN: 10 mg/dL (ref 6–20)
CO2: 21 mmol/L — ABNORMAL LOW (ref 22–32)
Calcium: 9.6 mg/dL (ref 8.9–10.3)
Chloride: 109 mmol/L (ref 101–111)
Creatinine, Ser: 0.86 mg/dL (ref 0.61–1.24)
GFR calc Af Amer: >60 mL/min (ref >60)
GFR calc non Af Amer: >60 mL/min (ref >60)
Glucose, Bld: 170 mg/dL — ABNORMAL HIGH (ref 65–99)
Potassium: 3.8 mmol/L (ref 3.5–5.1)
Sodium: 143 mmol/L (ref 135–145)
Total Bilirubin: 1.2 mg/dL (ref 0.3–1.2)
Total Protein: 8.3 g/dL — ABNORMAL HIGH (ref 6.5–8.1)

## 2015-12-09 LAB — CBC
HCT: 39.6 % (ref 39.0–52.0)
Hemoglobin: 13.2 g/dL (ref 13.0–17.0)
MCH: 31.2 pg (ref 26.0–34.0)
MCHC: 33.3 g/dL (ref 30.0–36.0)
MCV: 93.6 fL (ref 78.0–100.0)
Platelets: 228 10*3/uL (ref 150–400)
RBC: 4.23 MIL/uL (ref 4.22–5.81)
RDW: 12.5 % (ref 11.5–15.5)
WBC: 9.1 10*3/uL (ref 4.0–10.5)

## 2015-12-09 LAB — LIPASE, BLOOD: Lipase: 24 U/L (ref 11–51)

## 2015-12-09 MED ORDER — MORPHINE SULFATE (PF) 4 MG/ML IV SOLN
4.0000 mg | Freq: Once | INTRAVENOUS | Status: AC
  Administered 2015-12-09: 4 mg via INTRAVENOUS
  Filled 2015-12-09: qty 1

## 2015-12-09 MED ORDER — DIATRIZOATE MEGLUMINE & SODIUM 66-10 % PO SOLN
30.0000 mL | Freq: Once | ORAL | Status: AC
  Administered 2015-12-09: 30 mL via ORAL

## 2015-12-09 MED ORDER — METOCLOPRAMIDE HCL 5 MG/ML IJ SOLN
10.0000 mg | Freq: Once | INTRAMUSCULAR | Status: AC
Start: 2015-12-09 — End: 2015-12-09
  Administered 2015-12-09: 10 mg via INTRAVENOUS
  Filled 2015-12-09: qty 2

## 2015-12-09 MED ORDER — METOCLOPRAMIDE HCL 5 MG/ML IJ SOLN
10.0000 mg | Freq: Once | INTRAMUSCULAR | Status: AC
  Administered 2015-12-09: 10 mg via INTRAVENOUS
  Filled 2015-12-09: qty 2

## 2015-12-09 MED ORDER — METOCLOPRAMIDE HCL 10 MG PO TABS: 10.0000 mg | ORAL_TABLET | Freq: Four times a day (QID) | ORAL | Status: DC

## 2015-12-09 MED ORDER — ONDANSETRON 4 MG PO TBDP
4.0000 mg | ORAL_TABLET | Freq: Once | ORAL | Status: AC | PRN
  Administered 2015-12-09: 4 mg via ORAL
  Filled 2015-12-09: qty 1

## 2015-12-09 MED ORDER — SODIUM CHLORIDE 0.9 % IV BOLUS (SEPSIS)
1000.0000 mL | Freq: Once | INTRAVENOUS | Status: AC
  Administered 2015-12-09: 1000 mL via INTRAVENOUS

## 2015-12-09 MED ORDER — IOPAMIDOL (ISOVUE-300) INJECTION 61%
100.0000 mL | Freq: Once | INTRAVENOUS | Status: AC | PRN
  Administered 2015-12-09: 100 mL via INTRAVENOUS

## 2015-12-09 MED ORDER — HYDROCODONE-ACETAMINOPHEN 5-325 MG PO TABS: 1.0000 | ORAL_TABLET | ORAL | Status: DC | PRN

## 2015-12-09 NOTE — ED Provider Notes (Signed)
Hand-off from [employer] Electric[employer]andra Law, PA-C.   Briefly, pt is a 35yo male with past medical history of asthma, chronic abdominal pain, polysubstance abuse, gastroparesis who presents the ED with complaint of abdominal pain, onset one week. Patient reports having generalized abdominal tenderness which is worse on the right side. Endorses associated nausea, NBNB vomiting and nonbloody diarrhea that started yesterday. Denies fever, chills, cough, shortness of breath, chest pain, urinary symptoms. Patient endorses using marijuana and CT has been smoking all week, last used yesterday when his sxs started. Surgical hx of cholecystectomy.    Physical Exam  BP 150/77 mmHg  Pulse 69  Temp(Src) 98 F (36.7 C) (Oral)  Resp 17  Ht 5\' 9"  (1.753 m)  Wt 86.183 kg  BMI 28.05 kg/m2  SpO2 100%  Physical Exam  Constitutional: He is oriented to person, place, and time. He appears well-developed and well-nourished.  HENT:  Head: Normocephalic and atraumatic.  Mouth/Throat: Uvula is midline and oropharynx is clear and moist. Mucous membranes are dry. No oropharyngeal exudate, posterior oropharyngeal edema, posterior oropharyngeal erythema or tonsillar abscesses.  Eyes: Conjunctivae and EOM are normal. Right eye exhibits no discharge. Left eye exhibits no discharge. No scleral icterus.  Neck: Normal range of motion. Neck supple.  Cardiovascular: Normal rate, regular rhythm, normal heart sounds and intact distal pulses.   Pulmonary/Chest: Effort normal and breath sounds normal.  Abdominal: Soft. Bowel sounds are normal. He exhibits no distension and no mass. There is tenderness (TTP over RUQ, RLQ, periumbilical and epigastric regions, worse in RLQ). There is no rebound and no guarding.  Musculoskeletal: Normal range of motion. He exhibits no edema.  Lymphadenopathy:    He has no cervical adenopathy.  Neurological: He is alert and oriented to person, place, and time.  Skin: Skin is warm and dry.  Nursing note and vitals  reviewed.   ED Course  Procedures  MDM Pt presents with abdominal pain, N/V/D. Pt endorses cannabis use, last used last night. Pt with hx of cannabis hyperemesis and gastric paresis. Exam performed by initial provider revealed diffuse abdominal tenderness, worse on the right side. Patient reports mild relief of nausea and vomiting with Reglan in the ED however initial provider reports that on reevaluation the patient has continued to have multiple episodes of vomiting after doses of Reglan were given in ED. Patient has been given a total of 2 doses of 4 mg morphine, 2 doses of 10mg  reglan and 1 dose of 4mg  ODT zofran. Pt has only received a total of 500mL of IVF. Labs revealed glucose 170, no anion gap, urine positive for glucose, no ketones, remaining labs unremarkable. CT abdomen showed no acute abnormalities, 2 mm stone in upper pole of right kidney which appears unchanged. Plan to reevaluate patient after he has received a second liter of IV fluids and his second dose of morphine and Reglan.  Chart review shows patient has been seen in the ED over 10 times over the past year for similar symptoms associated to cannabis hyperemesis and gastric paresis. Patient endorses to continue using cannabis.  On my initial examination patient tender over right upper quadrant, right lower quadrant, periumbilical and epigastric region, worse in right lower quadrant, no peritoneal signs. Dry mucous membranes. Remaining exam unremarkable. Patient has been given second liter of IV fluids, second dose of morphine and Reglan.  On reevaluation patient reports his pain has improved. Denies any episodes of nausea or vomiting since second dose of Reglan. Denies any episodes of diarrhea in the ED.  Patient able tolerate by mouth in the ED. Discussed results and plan for discharge with patient. I discussed with patient the importance of refraining from cannabis use to prevent episodes of cannabis hyperemesis. Case management  spoke with patient regarding outpatient PCP follow-up. Patient discharged home with prescriptions for pain meds and Reglan.       Satira Sark [place], [place] 12/09/15 1820  Rolan Bucco, MD 12/09/15 1850

## 2015-12-09 NOTE — Progress Notes (Signed)
EDCM spoke to patient at bedside. Patient confirms he [place] a pcp [place].  [facility], informed patient of services there.  EDCM also provided patient with list of pcps who accept self pay patients, list of discount pharmacies and websites needymeds.org and GoodRX.com for medication assistance, phone number to inquire about the orange card, phone number to inquire about Medicaid, phone number to inquire about the Affordable Care Act, financial resources in the community such as local churches, [employer], urban ministries, and dental assistance for uninsured patients.  Patient thankful for resources.  No further EDCM needs at this time.  Patient's pcp listed as NP [place] Commons[facility]erie Keck who [facility] no longer [facility].  [facility].  EDCM will [facility] in efforts to obtain an appointment.  Patient provided phone number [phone].

## 2015-12-09 NOTE — ED Notes (Signed)
Per EMS Patient c/o RLQ abdominal pain, vomiting and watery diarrhea x 1 since this AM.

## 2015-12-09 NOTE — ED Notes (Signed)
Pt states last use of marijuana yesterday that's when symptoms started.

## 2015-12-09 NOTE — ED Notes (Addendum)
RASHIDAH CATHEY [id number] [phone]. CONTACT INFORMATION. SHE IS HIS RIDE HOME

## 2015-12-09 NOTE — ED Notes (Signed)
PO FLUIDS GIVEN 

## 2015-12-09 NOTE — Discharge Instructions (Signed)
Take your medications as prescribed as needed for pain control and nausea/vomiting. I recommend drinking at least six 8 ounce glasses of water daily to remain hydrated at home. I strongly recommend refraining from any cannabis use. Please follow up with a primary care provider from the Resource Guide provided below in 4-5 days for follow up. Please return to the Emergency Department if symptoms worsen or new onset of fever, uncontrollable vomiting, unable to keep fluids down, blood in vomit or stool, chest pain, difficulty breathing.

## 2015-12-09 NOTE — ED Provider Notes (Signed)
CSN: [id number]     Arrival date & time 12/09/15  1043 History   First MD Initiated Contact with Patient 12/09/15 1205     Chief Complaint  Patient presents with  . Abdominal Pain  . Emesis  . Diarrhea     (Consider location/radiation/quality/duration/timing/severity/associated sxs/prior Treatment) HPI Comments: Patient is a 35 year old male with history of cannabis hyperemesis who presents with 1 week of abdominal pain followed by 2 days of nausea, vomiting, diarrhea. The patient describes his abdominal pain as right sided, stabbing, and rates his pain as a 10/10. Patient has been constantly vomiting since yesterday according to patient, however he was able to keep down brownie and drinks outlined last night at 10:30 PM. Patient has been well hydrated throughout the week, but has not been drinking anything since yesterday. Patient has been throwing up yellow to brown bile. He has seen some blood in the vomitus recently. Patient has had associated sweating and cold chills. He states he is trying to quit smoking marijuana, but still smokes every day. He most recently smoked yesterday. The patient denies chest pain, shortness of breath, dysuria.   Patient is a 35 y.o. male presenting with abdominal pain, vomiting, and diarrhea. The history is provided by the patient.  Abdominal Pain Associated symptoms: diarrhea, nausea and vomiting   Associated symptoms: no chest pain, no chills, no dysuria, no fever, no shortness of breath and no sore throat   Emesis Associated symptoms: abdominal pain and diarrhea   Associated symptoms: no chills, no headaches and no sore throat   Diarrhea Associated symptoms: abdominal pain and vomiting   Associated symptoms: no chills, no fever and no headaches     Past Medical History  Diagnosis Date  . Asthma   . Chronic abdominal pain   . Nausea and vomiting     chronic, recurrent  . Polysubstance abuse   . Gastroparesis   . HELICOBACTER PYLORI INFECTION  12/14/2009    Qualifier: Diagnosis of  By: Levon Hedgerraddock, Brenda    . 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 GASTRITIS 12/09/2009    Qualifier: Diagnosis of  By: Daphine DeutscherMartin FNP, Zena AmosNykedtra     Past Surgical History  Procedure Laterality Date  . Cholecystectomy  11/2009   Family History  Problem Relation Age of Onset  . Other Father   . Diabetes Father   . Hypertension Father    Social History  Substance Use Topics  . Smoking status: Former Smoker -- 8 years    Types: Cigars    Quit date: 12/15/2014  . Smokeless tobacco: Never Used  . Alcohol Use: 0.0 oz/week    0 Standard drinks or equivalent per week     Comment: 12/16/2014 " I no Longer drink ,I quit about 2 years ago "    Review of Systems  Constitutional: Negative for fever and chills.  HENT: Negative for facial swelling and sore throat.   Respiratory: Negative for shortness of breath.   Cardiovascular: Negative for chest pain.  Gastrointestinal: Positive for nausea, vomiting, abdominal pain and diarrhea.  Genitourinary: Negative for dysuria.  Musculoskeletal: Negative for back pain.  Skin: Negative for rash and wound.  Neurological: Negative for headaches.  Psychiatric/Behavioral: The patient is not nervous/anxious.       Allergies  Review of patient's allergies indicates no known allergies.  Home Medications   Prior to Admission medications   Medication Sig Start Date End Date Taking? Authorizing Provider  HYDROcodone-acetaminophen (NORCO/VICODIN) 5-325 MG tablet Take 1 tablet by mouth every 4 (four) hours as needed. 12/09/15   Barrett Henle, PA-C  metoCLOPramide (REGLAN) 10 MG tablet Take 1 tablet (10 mg total) by mouth every 6 (six) hours. 12/09/15   Barrett Henle, PA-C  omeprazole (PRILOSEC) 40 MG capsule Take 1 capsule (40 mg total) by mouth daily. Patient not taking: Reported on 04/24/2015 02/04/15   Ambrose Finland, NP  polyethylene glycol (MIRALAX /  GLYCOLAX) packet Take 17 g by mouth daily. Patient not taking: Reported on 04/24/2015 01/18/15   Maretta Bees, MD  zolpidem (AMBIEN) 5 MG tablet Take 1 tablet (5 mg total) by mouth at bedtime as needed for sleep. Patient not taking: Reported on 04/24/2015 01/18/15   Maretta Bees, MD   BP 158/77 mmHg  Pulse 81  Temp(Src) 98 F (36.7 C) (Oral)  Resp 17  Ht  (1.753 m)  Wt 86.183 kg  BMI 28.05 kg/m2  SpO2 100% Physical Exam  Constitutional: He appears well-developed and well-nourished. No distress.  HENT:  Head: Normocephalic and atraumatic.  Mouth/Throat: No oropharyngeal exudate.  Eyes: Conjunctivae are normal. Pupils are equal, round, and reactive to light. Right eye exhibits no discharge. Left eye exhibits no discharge. No scleral icterus.  Neck: Normal range of motion. Neck supple. No thyromegaly present.  Cardiovascular: Normal rate, regular rhythm, normal heart sounds and intact distal pulses.  Exam reveals no gallop and no friction rub.   No murmur heard. Pulmonary/Chest: Effort normal and breath sounds normal. No stridor. No respiratory distress. He has no wheezes. He has no rales.  Abdominal: Soft. Bowel sounds are normal. He exhibits no distension. There is tenderness. There is no rebound and no guarding.  Diffuse tenderness, patient seems to be in severe pain on palpation, especially on the right side, upper and lower quadrants  Musculoskeletal: He exhibits no edema.  Lymphadenopathy:    He has no cervical adenopathy.  Neurological: He is alert. Coordination normal.  Skin: Skin is warm and dry. No rash noted. He is not diaphoretic. No pallor.  Psychiatric: He has a normal mood and affect.  Nursing note and vitals reviewed.   ED Course  Procedures (including critical care time) Labs Review Labs Reviewed  COMPREHENSIVE METABOLIC PANEL - Abnormal; Notable for the following:    CO2 21 (*)    Glucose, Bld 170 (*)    Total Protein 8.3 (*)    All other components  within normal limits  URINALYSIS, ROUTINE W REFLEX MICROSCOPIC (NOT AT [facility]) - Abnormal; Notable for the following:    Glucose, UA 500 (*)    All other components within normal limits  LIPASE, BLOOD  CBC    Imaging Review Ct Abdomen Pelvis W Contrast  12/09/2015  CLINICAL DATA:  Right lower quadrant pain. Vomiting and watery diarrhea. EXAM: CT ABDOMEN AND PELVIS WITH CONTRAST TECHNIQUE: Multidetector CT imaging of the abdomen and pelvis was performed using the standard protocol following bolus administration of intravenous contrast. CONTRAST:  ISOVUE-300 IOPAMIDOL (ISOVUE-300) INJECTION 61% COMPARISON:  Radiographs dated 04/24/2015 and CT scan dated 12/16/2014 FINDINGS: Lower chest:  Normal. Hepatobiliary: The liver is normal. Gallbladder has been removed. Normal bile ducts. Pancreas: Normal. Spleen: Normal. Adrenals/Urinary Tract: 2 mm stone in the upper pole of the right kidney, unchanged. Otherwise normal kidneys. Adrenal glands are normal. No hydronephrosis. Bladder is normal. Stomach/Bowel: Normal including the terminal ileum and appendix. Vascular/Lymphatic: Normal. Reproductive: Normal. Other: No free air or free fluid.  Musculoskeletal: Normal. IMPRESSION: No acute abnormalities. Normal appendix. 2 mm stone in the upper pole of the right kidney, unchanged. Electronically Signed   By: Francene Boyers M.D.   On: 12/09/2015 15:50   I have personally reviewed and evaluated these images and lab results as part of my medical decision-making.   EKG Interpretation None      MDM   UA unremarkable, with exception of glucose 500. Lipase 24. CMP shows glucose 170. CBC unremarkable. CT Abdomen/Pelvis shows no acute abnormalities; normal appendix, 2mm stone in the upper pole of R kidney that is unchanged from prior scan. Patient's pain and nausea controlled temporarily with Reglan and Morphine. Patient's pain and vomiting returned. I ordered a repeat of both medications and further fluid  resuscitation. Due to similar symptoms in the past, suspect cannabis hyperemesis as before. At shift change, patient care transferred to Trisha Mangle, PA-C for continued evaluation, follow up of continued nausea and pain control and trial of PO and determination of disposition. Patient also evaluated by Dr. Adriana Simas who is in agreement with plan.   Final diagnoses:  Cannabis abuse  Chronic abdominal pain  Non-intractable cyclical vomiting with nausea       Emi Holes, PA-C 12/09/15 2121  Donnetta Hutching, MD 12/10/15 [phone]

## 2015-12-10 ENCOUNTER — Telehealth: Payer: Self-pay

## 2015-12-10 NOTE — Telephone Encounter (Signed)
This Case Manager received communication from Radford PaxAmy Ferrero, RN [facility] that patient needing ED follow-up appointment.  This Case Manager placed call to patient at #[phone]; unable to reach patient. HIPPA compliant voicemail left requesting return call.

## 2015-12-10 NOTE — Telephone Encounter (Signed)
This Case Manager received return call from patient's father [phone]) who indicated the best number to reach patient is [phone]. Call placed to #[phone] to schedule ED follow-up appointment for patient; unable to reach patient.  HIPPA compliant voicemail left requesting return call.

## 2015-12-11 ENCOUNTER — Encounter (HOSPITAL_COMMUNITY): Payer: Self-pay

## 2015-12-11 ENCOUNTER — Emergency Department (HOSPITAL_COMMUNITY)
Admission: EM | Admit: 2015-12-11 | Discharge: 2015-12-11 | Disposition: A | Discharge status: Home / Self Care | Payer: Self-pay | Attending: Emergency Medicine | Admitting: Emergency Medicine

## 2015-12-11 DIAGNOSIS — Z9049 Acquired absence of other specified parts of digestive tract: Secondary | ICD-10-CM | POA: Insufficient documentation

## 2015-12-11 DIAGNOSIS — R112 Nausea with vomiting, unspecified: Secondary | ICD-10-CM | POA: Insufficient documentation

## 2015-12-11 DIAGNOSIS — R1011 Right upper quadrant pain: Secondary | ICD-10-CM | POA: Insufficient documentation

## 2015-12-11 DIAGNOSIS — R197 Diarrhea, unspecified: Secondary | ICD-10-CM | POA: Insufficient documentation

## 2015-12-11 DIAGNOSIS — Z8619 Personal history of other infectious and parasitic diseases: Secondary | ICD-10-CM | POA: Insufficient documentation

## 2015-12-11 DIAGNOSIS — Z87891 Personal history of nicotine dependence: Secondary | ICD-10-CM | POA: Insufficient documentation

## 2015-12-11 DIAGNOSIS — G8929 Other chronic pain: Secondary | ICD-10-CM | POA: Insufficient documentation

## 2015-12-11 DIAGNOSIS — J45909 Unspecified asthma, uncomplicated: Secondary | ICD-10-CM | POA: Insufficient documentation

## 2015-12-11 DIAGNOSIS — Z8719 Personal history of other diseases of the digestive system: Secondary | ICD-10-CM | POA: Insufficient documentation

## 2015-12-11 LAB — COMPREHENSIVE METABOLIC PANEL
ALT: 23 U/L (ref 17–63)
AST: 32 U/L (ref 15–41)
Albumin: 4.9 g/dL (ref 3.5–5.0)
Alkaline Phosphatase: 59 U/L (ref 38–126)
Anion gap: 16 — ABNORMAL HIGH (ref 5–15)
BUN: 15 mg/dL (ref 6–20)
CO2: 21 mmol/L — ABNORMAL LOW (ref 22–32)
Calcium: 10.2 mg/dL (ref 8.9–10.3)
Chloride: 103 mmol/L (ref 101–111)
Creatinine, Ser: 0.95 mg/dL (ref 0.61–1.24)
GFR calc Af Amer: >60 mL/min (ref >60)
GFR calc non Af Amer: >60 mL/min (ref >60)
Glucose, Bld: 136 mg/dL — ABNORMAL HIGH (ref 65–99)
Potassium: 3.4 mmol/L — ABNORMAL LOW (ref 3.5–5.1)
Sodium: 140 mmol/L (ref 135–145)
Total Bilirubin: 3.2 mg/dL — ABNORMAL HIGH (ref 0.3–1.2)
Total Protein: 8.6 g/dL — ABNORMAL HIGH (ref 6.5–8.1)

## 2015-12-11 LAB — CBC
HCT: 38.4 % — ABNORMAL LOW (ref 39.0–52.0)
Hemoglobin: 13 g/dL (ref 13.0–17.0)
MCH: 31.3 pg (ref 26.0–34.0)
MCHC: 33.9 g/dL (ref 30.0–36.0)
MCV: 92.3 fL (ref 78.0–100.0)
Platelets: 250 10*3/uL (ref 150–400)
RBC: 4.16 MIL/uL — ABNORMAL LOW (ref 4.22–5.81)
RDW: 12.7 % (ref 11.5–15.5)
WBC: 12.9 10*3/uL — ABNORMAL HIGH (ref 4.0–10.5)

## 2015-12-11 LAB — URINALYSIS, ROUTINE W REFLEX MICROSCOPIC
Glucose, UA: NEGATIVE mg/dL
Hgb urine dipstick: NEGATIVE
Ketones, ur: 40 mg/dL — AB
Leukocytes, UA: NEGATIVE
Nitrite: NEGATIVE
Protein, ur: 30 mg/dL — AB
Specific Gravity, Urine: 1.04 — ABNORMAL HIGH (ref 1.005–1.030)
pH: 6 (ref 5.0–8.0)

## 2015-12-11 LAB — URINE MICROSCOPIC-ADD ON

## 2015-12-11 LAB — LIPASE, BLOOD: Lipase: 24 U/L (ref 11–51)

## 2015-12-11 MED ORDER — CEFTRIAXONE SODIUM 250 MG IJ SOLR
250.0000 mg | Freq: Once | INTRAMUSCULAR | Status: AC
  Administered 2015-12-11: 250 mg via INTRAMUSCULAR
  Filled 2015-12-11: qty 250

## 2015-12-11 MED ORDER — LIDOCAINE HCL (PF) 1 % IJ SOLN
INTRAMUSCULAR | Status: AC
  Administered 2015-12-11: 5 mL
  Filled 2015-12-11: qty 5

## 2015-12-11 MED ORDER — ONDANSETRON HCL 4 MG/2ML IJ SOLN
4.0000 mg | Freq: Once | INTRAMUSCULAR | Status: AC | PRN
  Administered 2015-12-11: 4 mg via INTRAVENOUS
  Filled 2015-12-11: qty 2

## 2015-12-11 MED ORDER — SODIUM CHLORIDE 0.9 % IV BOLUS (SEPSIS)
1000.0000 mL | Freq: Once | INTRAVENOUS | Status: AC
  Administered 2015-12-11: 1000 mL via INTRAVENOUS

## 2015-12-11 MED ORDER — AZITHROMYCIN 250 MG PO TABS
1000.0000 mg | ORAL_TABLET | Freq: Once | ORAL | Status: AC
  Administered 2015-12-11: 1000 mg via ORAL
  Filled 2015-12-11: qty 4

## 2015-12-11 MED ORDER — METHOCARBAMOL 500 MG PO TABS: 500.0000 mg | ORAL_TABLET | Freq: Two times a day (BID) | ORAL | Status: DC

## 2015-12-11 MED ORDER — DICYCLOMINE HCL 10 MG/ML IM SOLN
20.0000 mg | Freq: Once | INTRAMUSCULAR | Status: AC
  Administered 2015-12-11: 20 mg via INTRAMUSCULAR
  Filled 2015-12-11: qty 2

## 2015-12-11 MED ORDER — PROCHLORPERAZINE EDISYLATE 5 MG/ML IJ SOLN
10.0000 mg | INTRAMUSCULAR | Status: DC | PRN
  Administered 2015-12-11: 10 mg via INTRAVENOUS
  Filled 2015-12-11: qty 2

## 2015-12-11 MED ORDER — METOCLOPRAMIDE HCL 5 MG/ML IJ SOLN
10.0000 mg | Freq: Once | INTRAMUSCULAR | Status: AC
  Administered 2015-12-11: 10 mg via INTRAVENOUS
  Filled 2015-12-11: qty 2

## 2015-12-11 MED ORDER — METOCLOPRAMIDE HCL 10 MG PO TABS: 10.0000 mg | ORAL_TABLET | Freq: Four times a day (QID) | ORAL | Status: DC | PRN

## 2015-12-11 NOTE — ED Notes (Signed)
Pt has been seen here this week for the same, he complains of vomiting with no relief from the meds he was given

## 2015-12-11 NOTE — ED Notes (Signed)
PA at bedside.

## 2015-12-11 NOTE — Discharge Instructions (Signed)
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  Please avoid using marijuana.  Take reglan as needed for nausea.  Take hot shower or warm bath may help.  Take robaxin as needed for muscle spasm and hiccups.  Follow up with your doctor for further care.   Nausea and Vomiting Nausea means you feel sick to your stomach. Throwing up (vomiting) is a reflex where stomach contents come out of your mouth. HOME CARE   Take medicine as told by your doctor.  Do not force yourself to eat. However, you do need to drink fluids.  If you feel like eating, eat a normal diet as told by your doctor.  Eat rice, wheat, potatoes, bread, lean meats, yogurt, fruits, and vegetables.  Avoid high-fat foods.  Drink enough fluids to keep your pee (urine) clear or pale yellow.  Ask your doctor how to replace body fluid losses (rehydrate). Signs of body fluid loss (dehydration) include:  Feeling very thirsty.  Dry lips and mouth.  Feeling dizzy.  Dark pee.  Peeing less than normal.  Feeling confused.  Fast breathing or heart rate. GET HELP RIGHT AWAY IF:   You have blood in your throw up.  You have black or bloody poop (stool).  You have a bad headache or stiff neck.  You feel confused.  You have bad belly (abdominal) pain.  You have chest pain or trouble breathing.  You do not pee at least once every 8 hours.  You have cold, clammy skin.  You keep throwing up after 24 to 48 hours.  You have a fever. MAKE SURE YOU:   Understand these instructions.  Will watch your condition.  Will get help right away if you are not doing well or get worse.   This information is not intended to replace advice given to you by your health care provider. Make sure you discuss any questions you have with your health care provider.   Document Released: 01/18/2008 Document Revised: 10/24/2011 Document Reviewed:  12/31/2010 Elsevier Interactive Patient Education [employer]2016 [employer]

## 2015-12-11 NOTE — ED Provider Notes (Signed)
CSN: [id number]     Arrival date & time 12/11/15  1324 History   First MD Initiated Contact with Patient 12/11/15 0606     Chief Complaint  Patient presents with  . Emesis     (Consider location/radiation/quality/duration/timing/severity/associated sxs/prior Treatment) HPI   35 year old male with history of chronic abdominal pain, polysubstance abuse including a significant history of marijuana use, gastroparesis and gastritis along with biliary dyskinesia presenting with abdominal pain and associate nausea vomiting. Patient report for more than 1 week he has had persistent nausea and vomiting states he vomits more than 10 times daily with non-bilious nonbloody vomits. Endorse upper abdominal pain primarily to the right upper quadrant, described as a burning crampy sensation worsening with vomiting. Pain is 10 out of 10, similar to prior abdominal pain. No associated fever, chills, chest pain, shortness of breath, productive cough, back pain, dysuria, hematuria. Patient has multiple ER visits for same, last visit was 2 days ago. Patient has prior surgical hx of cholecystectomy. An abdominal and CT scan was obtained that time that shows no acute finding. Patient received pain medication and IV hydration. Furthermore, patient also requesting for treatment of potential STDs. He has had 3 separate sexual partner within the past 6 months using protection each time. He denies having any penile discharge, scrotal swelling or testicle pain. Report prior history of Genital herpes.    Past Medical History  Diagnosis Date  . Asthma   . Chronic abdominal pain   . Nausea and vomiting     chronic, recurrent  . Polysubstance abuse   . Gastroparesis   . 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 GASTRITIS 12/09/2009    Qualifier: Diagnosis of  By: Daphine Deutscher FNP, Zena Amos      Past Surgical History  Procedure Laterality Date  . Cholecystectomy  11/2009   Family History  Problem Relation Age of Onset  . Other Father   . Diabetes Father   . Hypertension Father    Social History  Substance Use Topics  . Smoking status: Former Smoker -- 8 years    Types: Cigars    Quit date: 12/15/2014  . Smokeless tobacco: Never Used  . Alcohol Use: 0.0 oz/week    0 Standard drinks or equivalent per week     Comment: 12/16/2014 " I no Longer drink ,I quit about 2 years ago "    Review of Systems  All other systems reviewed and are negative.     Allergies  Review of patient's allergies indicates no known allergies.  Home Medications   Prior to Admission medications   Medication Sig Start Date End Date Taking? Authorizing Provider  HYDROcodone-acetaminophen (NORCO/VICODIN) 5-325 MG tablet Take 1 tablet by mouth every 4 (four) hours as needed. Patient taking differently: Take 1 tablet by mouth every 4 (four) hours as needed for moderate pain or severe pain.  12/09/15  Yes Barrett Henle, PA-C  metoCLOPramide (REGLAN) 10 MG tablet Take 1 tablet (10 mg total) by mouth every 6 (six) hours. Patient taking differently: Take 10 mg by mouth every 6 (six) hours as needed for nausea or vomiting.  12/09/15  Yes Barrett Henle, PA-C  omeprazole (PRILOSEC) 40 MG capsule Take 1 capsule (40 mg total) by mouth daily. Patient not taking: Reported on 04/24/2015 02/04/15   Ambrose Finland, NP  polyethylene glycol (MIRALAX / GLYCOLAX) packet Take 17 g by mouth daily. Patient not taking: Reported on 04/24/2015 01/18/15   Maretta Bees, MD  zolpidem (AMBIEN) 5 MG tablet Take 1 tablet (5 mg total) by mouth at bedtime as needed for sleep. Patient not taking: Reported on 04/24/2015 01/18/15   Maretta Bees, MD   BP 159/90 mmHg  Pulse 58  Temp(Src) 98.4 F (36.9 C) (Oral)  Resp 18  SpO2 97% Physical Exam  Constitutional: He appears well-developed and well-nourished.   African-American male, actively dry heaving but nontoxic  HENT:  Head: Atraumatic.  Eyes: Conjunctivae are normal.  Neck: Neck supple.  Cardiovascular: Normal rate and regular rhythm.   Pulmonary/Chest: Effort normal and breath sounds normal.  Abdominal: Soft. Bowel sounds are normal. He exhibits no distension. There is tenderness (Diffuse abdominal tenderness most significant to right upper quadrant without guarding rebound tenderness.).  Genitourinary: Penis normal.  No CVA tenderness  Neurological: He is alert.  Skin: No rash noted.  Psychiatric: He has a normal mood and affect.  Nursing note and vitals reviewed.   ED Course  Procedures (including critical care time) Labs Review Labs Reviewed  COMPREHENSIVE METABOLIC PANEL - Abnormal; Notable for the following:    Potassium 3.4 (*)    CO2 21 (*)    Glucose, Bld 136 (*)    Total Protein 8.6 (*)    Total Bilirubin 3.2 (*)    Anion gap 16 (*)    All other components within normal limits  CBC - Abnormal; Notable for the following:    WBC 12.9 (*)    RBC 4.16 (*)    HCT 38.4 (*)    All other components within normal limits  URINALYSIS, ROUTINE W REFLEX MICROSCOPIC (NOT AT [facility]) - Abnormal; Notable for the following:    Color, Urine AMBER (*)    Specific Gravity, Urine 1.040 (*)    Bilirubin Urine SMALL (*)    Ketones, ur 40 (*)    Protein, ur 30 (*)    All other components within normal limits  URINE MICROSCOPIC-ADD ON - Abnormal; Notable for the following:    Squamous Epithelial / LPF 0-5 (*)    Bacteria, UA RARE (*)    All other components within normal limits  LIPASE, BLOOD  GC/CHLAMYDIA PROBE AMP ([place]) NOT AT [facility]    Imaging Review Ct Abdomen Pelvis W Contrast  12/09/2015  CLINICAL DATA:  Right lower quadrant pain. Vomiting and watery diarrhea. EXAM: CT ABDOMEN AND PELVIS WITH CONTRAST TECHNIQUE: Multidetector CT imaging of the abdomen and pelvis was performed using the standard protocol following bolus  administration of intravenous contrast. CONTRAST:  ISOVUE-300 IOPAMIDOL (ISOVUE-300) INJECTION 61% COMPARISON:  Radiographs dated 04/24/2015 and CT scan dated 12/16/2014 FINDINGS: Lower chest:  Normal. Hepatobiliary: The liver is normal. Gallbladder has been removed. Normal bile ducts. Pancreas: Normal. Spleen: Normal. Adrenals/Urinary Tract: 2 mm stone in the upper pole of the right kidney, unchanged. Otherwise normal kidneys. Adrenal glands are normal. No hydronephrosis. Bladder is normal. Stomach/Bowel: Normal including the terminal ileum and appendix. Vascular/Lymphatic: Normal. Reproductive: Normal. Other: No free air or free fluid. Musculoskeletal: Normal. IMPRESSION: No acute abnormalities. Normal appendix. 2 mm stone in the upper pole of the right kidney, unchanged. Electronically Signed   By: Francene Boyers M.D.   On: 12/09/2015 15:50   I have personally reviewed and evaluated these images and lab results as part of my medical decision-making.   EKG Interpretation None  MDM   Final diagnoses:  Nausea vomiting and diarrhea    BP 156/81 mmHg  Pulse 66  Temp(Src) 98.5 F (36.9 C) (Oral)  Resp 20  SpO2 99%   7:26 AM Pt here with persistent upper abd pain with associated N/V, and significant hx of polysubstance abuse specifically daily marijuana use.  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  Recent negative abd/pelvic CT 2 days ago. Prior surgical cholecystectomy.  Plan to provide IVF, antiemetic and Bentyl.  I do not think narcotic pain med is appropriate in this setting.    9:47 AM  patient now able to tolerates by mouth. He has had effective for the past 30 minutes. I suspect this is likely due to overuse of diaphragm and esophagus from persistent vomiting. I have low suspicion for ACS. Moderate amount of time was spent to encourage patient to avoid marijuana use as this is likely the cause of his symptoms. I recommend using warm bath and shower to help with his  condition. He'll be discharged with antiemetic medication and return precaution provided.  Fayrene HelperBowie Long Brimage, PA-C 12/11/15 [id number]  Derwood KaplanAnkit Nanavati, MD 12/11/15 [phone]

## 2015-12-13 ENCOUNTER — Encounter (HOSPITAL_COMMUNITY): Payer: Self-pay | Admitting: Emergency Medicine

## 2015-12-13 ENCOUNTER — Emergency Department (HOSPITAL_COMMUNITY)
Admission: EM | Admit: 2015-12-13 | Discharge: 2015-12-13 | Disposition: A | Discharge status: Home / Self Care | Payer: Self-pay | Attending: Emergency Medicine | Admitting: Emergency Medicine

## 2015-12-13 ENCOUNTER — Emergency Department (HOSPITAL_COMMUNITY): Payer: Self-pay

## 2015-12-13 DIAGNOSIS — J45909 Unspecified asthma, uncomplicated: Secondary | ICD-10-CM | POA: Insufficient documentation

## 2015-12-13 DIAGNOSIS — G8929 Other chronic pain: Secondary | ICD-10-CM | POA: Insufficient documentation

## 2015-12-13 DIAGNOSIS — R1013 Epigastric pain: Secondary | ICD-10-CM | POA: Insufficient documentation

## 2015-12-13 DIAGNOSIS — R1011 Right upper quadrant pain: Secondary | ICD-10-CM | POA: Insufficient documentation

## 2015-12-13 DIAGNOSIS — Z8619 Personal history of other infectious and parasitic diseases: Secondary | ICD-10-CM | POA: Insufficient documentation

## 2015-12-13 DIAGNOSIS — F1210 Cannabis abuse, uncomplicated: Secondary | ICD-10-CM | POA: Insufficient documentation

## 2015-12-13 DIAGNOSIS — R112 Nausea with vomiting, unspecified: Secondary | ICD-10-CM

## 2015-12-13 DIAGNOSIS — Z79899 Other long term (current) drug therapy: Secondary | ICD-10-CM | POA: Insufficient documentation

## 2015-12-13 DIAGNOSIS — F1110 Opioid abuse, uncomplicated: Secondary | ICD-10-CM | POA: Insufficient documentation

## 2015-12-13 DIAGNOSIS — E876 Hypokalemia: Secondary | ICD-10-CM | POA: Insufficient documentation

## 2015-12-13 DIAGNOSIS — K2970 Gastritis, unspecified, without bleeding: Secondary | ICD-10-CM | POA: Insufficient documentation

## 2015-12-13 DIAGNOSIS — R630 Anorexia: Secondary | ICD-10-CM | POA: Insufficient documentation

## 2015-12-13 DIAGNOSIS — Z87891 Personal history of nicotine dependence: Secondary | ICD-10-CM | POA: Insufficient documentation

## 2015-12-13 LAB — RAPID URINE DRUG SCREEN, HOSP PERFORMED
Amphetamines: NOT DETECTED
Barbiturates: NOT DETECTED
Benzodiazepines: NOT DETECTED
Cocaine: NOT DETECTED
Opiates: POSITIVE — AB
Tetrahydrocannabinol: POSITIVE — AB

## 2015-12-13 LAB — I-STAT CG4 LACTIC ACID, ED
Lactic Acid, Venous: 1.3 mmol/L (ref 0.5–2.0)
Lactic Acid, Venous: 0.87 mmol/L (ref 0.5–2.0)

## 2015-12-13 LAB — LIPASE, BLOOD: Lipase: 16 U/L (ref 11–51)

## 2015-12-13 LAB — URINALYSIS, ROUTINE W REFLEX MICROSCOPIC
Glucose, UA: NEGATIVE mg/dL
Hgb urine dipstick: NEGATIVE
Ketones, ur: 40 mg/dL — AB
Leukocytes, UA: NEGATIVE
Nitrite: NEGATIVE
Protein, ur: NEGATIVE mg/dL
Specific Gravity, Urine: 1.034 — ABNORMAL HIGH (ref 1.005–1.030)
pH: 6 (ref 5.0–8.0)

## 2015-12-13 LAB — COMPREHENSIVE METABOLIC PANEL
ALT: 26 U/L (ref 17–63)
AST: 29 U/L (ref 15–41)
Albumin: 4.6 g/dL (ref 3.5–5.0)
Alkaline Phosphatase: 62 U/L (ref 38–126)
Anion gap: 15 (ref 5–15)
BUN: 11 mg/dL (ref 6–20)
CO2: 27 mmol/L (ref 22–32)
Calcium: 10 mg/dL (ref 8.9–10.3)
Chloride: 95 mmol/L — ABNORMAL LOW (ref 101–111)
Creatinine, Ser: 0.98 mg/dL (ref 0.61–1.24)
GFR calc Af Amer: >60 mL/min (ref >60)
GFR calc non Af Amer: >60 mL/min (ref >60)
Glucose, Bld: 118 mg/dL — ABNORMAL HIGH (ref 65–99)
Potassium: 2.9 mmol/L — ABNORMAL LOW (ref 3.5–5.1)
Sodium: 137 mmol/L (ref 135–145)
Total Bilirubin: 3.6 mg/dL — ABNORMAL HIGH (ref 0.3–1.2)
Total Protein: 8 g/dL (ref 6.5–8.1)

## 2015-12-13 LAB — CBC WITH DIFFERENTIAL/PLATELET
Basophils Absolute: 0 10*3/uL (ref 0.0–0.1)
Basophils Relative: 0 %
Eosinophils Absolute: 0 10*3/uL (ref 0.0–0.7)
Eosinophils Relative: 0 %
HCT: 41.4 % (ref 39.0–52.0)
Hemoglobin: 14.2 g/dL (ref 13.0–17.0)
Lymphocytes Relative: 24 %
Lymphs Abs: 2.3 10*3/uL (ref 0.7–4.0)
MCH: 31.6 pg (ref 26.0–34.0)
MCHC: 34.3 g/dL (ref 30.0–36.0)
MCV: 92.2 fL (ref 78.0–100.0)
Monocytes Absolute: 0.7 10*3/uL (ref 0.1–1.0)
Monocytes Relative: 7 %
Neutro Abs: 6.7 10*3/uL (ref 1.7–7.7)
Neutrophils Relative %: 69 %
Platelets: 254 10*3/uL (ref 150–400)
RBC: 4.49 MIL/uL (ref 4.22–5.81)
RDW: 12.1 % (ref 11.5–15.5)
WBC: 9.7 10*3/uL (ref 4.0–10.5)

## 2015-12-13 MED ORDER — ONDANSETRON HCL 4 MG PO TABS: 4.0000 mg | ORAL_TABLET | Freq: Four times a day (QID) | ORAL | Status: DC

## 2015-12-13 MED ORDER — POTASSIUM CHLORIDE CRYS ER 20 MEQ PO TBCR
40.0000 meq | EXTENDED_RELEASE_TABLET | Freq: Once | ORAL | Status: AC
  Administered 2015-12-13: 40 meq via ORAL
  Filled 2015-12-13: qty 2

## 2015-12-13 MED ORDER — OMEPRAZOLE 20 MG PO CPDR: 20.0000 mg | DELAYED_RELEASE_CAPSULE | Freq: Every day | ORAL | Status: DC

## 2015-12-13 MED ORDER — ONDANSETRON 4 MG PO TBDP
4.0000 mg | ORAL_TABLET | Freq: Once | ORAL | Status: AC
  Administered 2015-12-13: 4 mg via ORAL

## 2015-12-13 MED ORDER — POTASSIUM CHLORIDE 10 MEQ/100ML IV SOLN
10.0000 meq | INTRAVENOUS | Status: AC
  Administered 2015-12-13: 10 meq via INTRAVENOUS
  Filled 2015-12-13: qty 100

## 2015-12-13 MED ORDER — PANTOPRAZOLE SODIUM 40 MG IV SOLR
40.0000 mg | Freq: Once | INTRAVENOUS | Status: AC
  Administered 2015-12-13: 40 mg via INTRAVENOUS
  Filled 2015-12-13: qty 40

## 2015-12-13 MED ORDER — SUCRALFATE 1 G PO TABS: 1.0000 g | ORAL_TABLET | Freq: Three times a day (TID) | ORAL | Status: DC

## 2015-12-13 MED ORDER — POTASSIUM CHLORIDE ER 10 MEQ PO TBCR: 10.0000 meq | EXTENDED_RELEASE_TABLET | Freq: Two times a day (BID) | ORAL | Status: DC

## 2015-12-13 MED ORDER — SODIUM CHLORIDE 0.9 % IV BOLUS (SEPSIS): 1000.0000 mL | Freq: Once | INTRAVENOUS | Status: DC

## 2015-12-13 MED ORDER — METOCLOPRAMIDE HCL 5 MG/ML IJ SOLN
10.0000 mg | Freq: Once | INTRAMUSCULAR | Status: AC
  Administered 2015-12-13: 10 mg via INTRAVENOUS
  Filled 2015-12-13: qty 2

## 2015-12-13 MED ORDER — SODIUM CHLORIDE 0.9 % IV BOLUS (SEPSIS)
1000.0000 mL | Freq: Once | INTRAVENOUS | Status: AC
  Administered 2015-12-13: 1000 mL via INTRAVENOUS

## 2015-12-13 MED ORDER — ONDANSETRON 4 MG PO TBDP
ORAL_TABLET | ORAL | Status: AC
  Filled 2015-12-13: qty 1

## 2015-12-13 MED ORDER — GI COCKTAIL ~~LOC~~
30.0000 mL | Freq: Once | ORAL | Status: AC
  Administered 2015-12-13: 30 mL via ORAL
  Filled 2015-12-13: qty 30

## 2015-12-13 MED ORDER — LORAZEPAM 2 MG/ML IJ SOLN
0.5000 mg | Freq: Once | INTRAMUSCULAR | Status: AC
  Administered 2015-12-13: 0.5 mg via INTRAVENOUS
  Filled 2015-12-13: qty 1

## 2015-12-13 NOTE — ED Notes (Signed)
Pt. Stated, Lavenia Atlasve been back and forth to hospitals and Im still having vomiting.

## 2015-12-13 NOTE — ED Notes (Signed)
Pt offered gingerale states feels better very sleepy

## 2015-12-13 NOTE — Discharge Instructions (Signed)
Nausea and Vomiting Keep yourself hydrated and take your medications as prescribed. Follow up with your stomach doctor. Return to the ED if you develop new or worsening symptoms. Nausea is a sick feeling that often comes before throwing up (vomiting). Vomiting is a reflex where stomach contents come out of your mouth. Vomiting can cause severe loss of body fluids (dehydration). Children and elderly adults can become dehydrated quickly, especially if they also have diarrhea. Nausea and vomiting are symptoms of a condition or disease. It is important to find the cause of your symptoms. CAUSES   Direct irritation of the stomach lining. This irritation can result from increased acid production (gastroesophageal reflux disease), infection, food poisoning, taking certain medicines (such as nonsteroidal anti-inflammatory drugs), alcohol use, or tobacco use.  Signals from the brain.These signals could be caused by a headache, heat exposure, an inner ear disturbance, increased pressure in the brain from injury, infection, a tumor, or a concussion, pain, emotional stimulus, or metabolic problems.  An obstruction in the gastrointestinal tract (bowel obstruction).  Illnesses such as diabetes, hepatitis, gallbladder problems, appendicitis, kidney problems, cancer, sepsis, atypical symptoms of a heart attack, or eating disorders.  Medical treatments such as chemotherapy and radiation.  Receiving medicine that makes you sleep (general anesthetic) during surgery. DIAGNOSIS Your caregiver may ask for tests to be done if the problems do not improve after a few days. Tests may also be done if symptoms are severe or if the reason for the nausea and vomiting is not clear. Tests may include:  Urine tests.  Blood tests.  Stool tests.  Cultures (to look for evidence of infection).  X-rays or other imaging studies. Test results can help your caregiver make decisions about treatment or the need for additional  tests. TREATMENT You need to stay well hydrated. Drink frequently but in small amounts.You may wish to drink water, sports drinks, clear broth, or eat frozen ice pops or gelatin dessert to help stay hydrated.When you eat, eating slowly may help prevent nausea.There are also some antinausea medicines that may help prevent nausea. HOME CARE INSTRUCTIONS   Take all medicine as directed by your caregiver.  If you do not have an appetite, do not force yourself to eat. However, you must continue to drink fluids.  If you have an appetite, eat a normal diet unless your caregiver tells you differently.  Eat a variety of complex carbohydrates (rice, wheat, potatoes, bread), lean meats, yogurt, fruits, and vegetables.  Avoid high-fat foods because they are more difficult to digest.  Drink enough water and fluids to keep your urine clear or pale yellow.  If you are dehydrated, ask your caregiver for specific rehydration instructions. Signs of dehydration may include:  Severe thirst.  Dry lips and mouth.  Dizziness.  Dark urine.  Decreasing urine frequency and amount.  Confusion.  Rapid breathing or pulse. SEEK IMMEDIATE MEDICAL CARE IF:   You have blood or brown flecks (like coffee grounds) in your vomit.  You have black or bloody stools.  You have a severe headache or stiff neck.  You are confused.  You have severe abdominal pain.  You have chest pain or trouble breathing.  You do not urinate at least once every 8 hours.  You develop cold or clammy skin.  You continue to vomit for longer than 24 to 48 hours.  You have a fever. MAKE SURE YOU:   Understand these instructions.  Will watch your condition.  Will get help right away if you are  not doing well or get worse.   This information is not intended to replace advice given to you by your health care provider. Make sure you discuss any questions you have with your health care provider.   Document Released:  08/01/2005 Document Revised: 10/24/2011 Document Reviewed: 12/29/2010 Elsevier Interactive Patient Education [employer].

## 2015-12-13 NOTE — ED Provider Notes (Signed)
CSN: [id number]     Arrival date & time 12/13/15  0751 History   First MD Initiated Contact with Patient 12/13/15 1045     Chief Complaint  Patient presents with  . Emesis     (Consider location/radiation/quality/duration/timing/severity/associated sxs/prior Treatment) HPI Comments: Patient with acute on chronic abdominal pain. 2 visits earlier this week for same symptoms. States he's been having constant vomiting 5-6 times daily for the past week that is nonbilious and nonbloody. endorses upper abdominal pain and right upper quadrant pain described as burning sensation that is worse with vomiting and palpation. Pain is similar to previous episodes of abdominal pain. He was told this may be due to his cannabis use that he has not smoked any marijuana in 1 week. Denies any other drug use. Denies any alcohol use. States he had a fever to 102 days ago. Denies chest pain or shortness of breath. Denies back pain. Denies urinary symptoms or testicular pain. Previous cholecystectomy. CT scan on April 26 was unremarkable. Patient does not have a gastroenterologist or PCP. He does not take any chronic medications. He states his gallbladder was removed for similar symptoms 3 or 4 years ago but he never really had improvement.  Patient is a 35 y.o. male presenting with vomiting. The history is provided by the patient and a relative.  Emesis Associated symptoms: abdominal pain   Associated symptoms: no arthralgias, no headaches and no myalgias     Past Medical History  Diagnosis Date  . Asthma   . Chronic abdominal pain   . Nausea and vomiting     chronic, recurrent  . Polysubstance abuse   . Gastroparesis   . 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 GASTRITIS 12/09/2009    Qualifier: Diagnosis of  By: Daphine Deutscher FNP, Zena Amos     Past Surgical History  Procedure  Laterality Date  . Cholecystectomy  11/2009   Family History  Problem Relation Age of Onset  . Other Father   . Diabetes Father   . Hypertension Father    Social History  Substance Use Topics  . Smoking status: Former Smoker -- 8 years    Types: Cigars    Quit date: 12/15/2014  . Smokeless tobacco: Never Used  . Alcohol Use: 0.0 oz/week    0 Standard drinks or equivalent per week     Comment: 12/16/2014 " I no Longer drink ,I quit about 2 years ago "    Review of Systems  Constitutional: Positive for activity change, appetite change and fatigue. Negative for fever.  HENT: Negative for congestion.   Eyes: Negative for visual disturbance.  Respiratory: Negative for cough, chest tightness and shortness of breath.   Gastrointestinal: Positive for nausea, vomiting and abdominal pain.  Genitourinary: Negative for dysuria, hematuria and testicular pain.  Musculoskeletal: Negative for myalgias and arthralgias.  Skin: Negative for wound.  Neurological: Positive for weakness. Negative for dizziness and headaches.  A complete 10 system review of systems was obtained and all systems are negative except as noted in the HPI and PMH.      Allergies  Review of patient's allergies indicates no known allergies.  Home Medications   Prior to Admission medications   Medication Sig Start Date End Date Taking? Authorizing Provider  HYDROcodone-acetaminophen (NORCO/VICODIN) 5-325 MG tablet Take 1 tablet by mouth every 4 (  four) hours as needed. Patient taking differently: Take 1 tablet by mouth every 4 (four) hours as needed for moderate pain or severe pain.  12/09/15   Barrett HenleNicole Elizabeth Nadeau, PA-C  methocarbamol (ROBAXIN) 500 MG tablet Take 1 tablet (500 mg total) by mouth 2 (two) times daily. 12/11/15   Fayrene HelperBowie Tran, PA-C  metoCLOPramide (REGLAN) 10 MG tablet Take 1 tablet (10 mg total) by mouth every 6 (six) hours as needed for nausea or vomiting. 12/11/15   Fayrene HelperBowie Tran, PA-C  omeprazole (PRILOSEC)  20 MG capsule Take 1 capsule (20 mg total) by mouth daily. 12/13/15   Glynn OctaveStephen Krystofer Hevener, MD  ondansetron (ZOFRAN) 4 MG tablet Take 1 tablet (4 mg total) by mouth every 6 (six) hours. 12/13/15   Glynn OctaveStephen Mikyla Schachter, MD  polyethylene glycol (MIRALAX / GLYCOLAX) packet Take 17 g by mouth daily. Patient not taking: Reported on 04/24/2015 01/18/15   Maretta BeesShanker M Ghimire, MD  potassium chloride (K-DUR) 10 MEQ tablet Take 1 tablet (10 mEq total) by mouth 2 (two) times daily. 12/13/15   Glynn OctaveStephen Satrina Magallanes, MD  sucralfate (CARAFATE) 1 g tablet Take 1 tablet (1 g total) by mouth 4 (four) times daily -  with meals and at bedtime. 12/13/15   Glynn OctaveStephen Janat Tabbert, MD  zolpidem (AMBIEN) 5 MG tablet Take 1 tablet (5 mg total) by mouth at bedtime as needed for sleep. Patient not taking: Reported on 04/24/2015 01/18/15   Maretta BeesShanker M Ghimire, MD   BP 142/80 mmHg  Pulse 56  Temp(Src) 98.3 F (36.8 C) (Oral)  Resp 27  Wt 198 lb 1.6 oz (89.858 kg)  SpO2 96% Physical Exam  Constitutional: He is oriented to person, place, and time. He appears well-developed and well-nourished. No distress.  Moist mucus membranes  HENT:  Head: Normocephalic and atraumatic.  Mouth/Throat: Oropharynx is clear and moist. No oropharyngeal exudate.  Eyes: Conjunctivae and EOM are normal. Pupils are equal, round, and reactive to light.  Neck: Normal range of motion. Neck supple.  No meningismus.  Cardiovascular: Normal rate, regular rhythm, normal heart sounds and intact distal pulses.   No murmur heard. Intact DP and PT pulses  Pulmonary/Chest: Effort normal and breath sounds normal. No respiratory distress.  Abdominal: Soft. There is tenderness. There is no rebound and no guarding.  RUQ and epigastric tenderness, no guarding or rebound  Genitourinary:  No testicular pain  Musculoskeletal: Normal range of motion. He exhibits no edema or tenderness.  No CVAT  Neurological: He is alert and oriented to person, place, and time. No cranial nerve deficit. He  exhibits normal muscle tone. Coordination normal.  No ataxia on finger to nose bilaterally. No pronator drift. 5/5 strength throughout. CN 2-12 intact.Equal grip strength. Sensation intact.   Skin: Skin is warm.  Psychiatric: He has a normal mood and affect. His behavior is normal.  Nursing note and vitals reviewed.   ED Course  Procedures (including critical care time) Labs Review Labs Reviewed  COMPREHENSIVE METABOLIC PANEL - Abnormal; Notable for the following:    Potassium 2.9 (*)    Chloride 95 (*)    Glucose, Bld 118 (*)    Total Bilirubin 3.6 (*)    All other components within normal limits  URINALYSIS, ROUTINE W REFLEX MICROSCOPIC (NOT AT [facility]) - Abnormal; Notable for the following:    Color, Urine AMBER (*)    Specific Gravity, Urine 1.034 (*)    Bilirubin Urine SMALL (*)    Ketones, ur 40 (*)    All other components within normal  limits  URINE RAPID DRUG SCREEN, HOSP PERFORMED - Abnormal; Notable for the following:    Opiates POSITIVE (*)    Tetrahydrocannabinol POSITIVE (*)    All other components within normal limits  CBC WITH DIFFERENTIAL/PLATELET  LIPASE, BLOOD  I-STAT CG4 LACTIC ACID, ED  I-STAT CG4 LACTIC ACID, ED    Imaging Review Dg Abd Acute W/chest  12/13/2015  CLINICAL DATA:  Right upper quadrant pain and emesis for 6 days EXAM: DG ABDOMEN ACUTE W/ 1V CHEST COMPARISON:  12/09/2015 FINDINGS: Normal heart size. No pleural effusion or edema. No airspace consolidation identified. The bowel gas pattern is nonobstructed. No dilated loops of small bowel. No free intraperitoneal air noted. Cholecystectomy clips are present within the right upper quadrant of the abdomen. IMPRESSION: 1. No active cardiopulmonary abnormalities. 2. Nonobstructive bowel gas pattern. Electronically Signed   By: Signa Kell M.D.   On: 12/13/2015 12:55   I have personally reviewed and evaluated these images and lab results as part of my medical decision-making.   EKG  Interpretation None      MDM   Final diagnoses:  Nausea and vomiting, vomiting of unspecified type  Hypokalemia   Acute on chronic abdominal pain with nausea and vomiting. History of marijuana abuse. Recently negative CT scan.  Abdomen without peritoneal signs. IVF, antiemetics, avoid narcotics.   Labs show hypokalemia which was repleted. No vomiting in the ED. Patient tolerating PO. Start PPI, carafate, followup with GI.  Continue reglan.  Return precautions discussed. Avoid marijuana use.     Glynn Octave, MD 12/13/15 [phone]

## 2015-12-13 NOTE — ED Notes (Signed)
Pt stable, ambulatory, states understanding of discharge instructions 

## 2015-12-13 NOTE — ED Notes (Signed)
To x-ray

## 2015-12-14 ENCOUNTER — Telehealth: Payer: Self-pay

## 2015-12-14 ENCOUNTER — Encounter (HOSPITAL_COMMUNITY): Payer: Self-pay | Admitting: *Deleted

## 2015-12-14 ENCOUNTER — Emergency Department (HOSPITAL_COMMUNITY)
Admission: EM | Admit: 2015-12-14 | Discharge: 2015-12-14 | Disposition: A | Discharge status: Home / Self Care | Payer: Self-pay | Attending: Emergency Medicine | Admitting: Emergency Medicine

## 2015-12-14 DIAGNOSIS — Z87891 Personal history of nicotine dependence: Secondary | ICD-10-CM | POA: Insufficient documentation

## 2015-12-14 DIAGNOSIS — R112 Nausea with vomiting, unspecified: Secondary | ICD-10-CM | POA: Insufficient documentation

## 2015-12-14 DIAGNOSIS — R1013 Epigastric pain: Secondary | ICD-10-CM | POA: Insufficient documentation

## 2015-12-14 DIAGNOSIS — R109 Unspecified abdominal pain: Secondary | ICD-10-CM

## 2015-12-14 DIAGNOSIS — J45909 Unspecified asthma, uncomplicated: Secondary | ICD-10-CM | POA: Insufficient documentation

## 2015-12-14 DIAGNOSIS — G8929 Other chronic pain: Secondary | ICD-10-CM | POA: Insufficient documentation

## 2015-12-14 DIAGNOSIS — R1011 Right upper quadrant pain: Secondary | ICD-10-CM | POA: Insufficient documentation

## 2015-12-14 DIAGNOSIS — Z79899 Other long term (current) drug therapy: Secondary | ICD-10-CM | POA: Insufficient documentation

## 2015-12-14 LAB — CBC WITH DIFFERENTIAL/PLATELET
Basophils Absolute: 0 10*3/uL (ref 0.0–0.1)
Basophils Relative: 0 %
Eosinophils Absolute: 0 10*3/uL (ref 0.0–0.7)
Eosinophils Relative: 0 %
HCT: 36.8 % — ABNORMAL LOW (ref 39.0–52.0)
Hemoglobin: 12.6 g/dL — ABNORMAL LOW (ref 13.0–17.0)
Lymphocytes Relative: 14 %
Lymphs Abs: 0.9 10*3/uL (ref 0.7–4.0)
MCH: 31 pg (ref 26.0–34.0)
MCHC: 34.2 g/dL (ref 30.0–36.0)
MCV: 90.4 fL (ref 78.0–100.0)
Monocytes Absolute: 0.4 10*3/uL (ref 0.1–1.0)
Monocytes Relative: 7 %
Neutro Abs: 5.1 10*3/uL (ref 1.7–7.7)
Neutrophils Relative %: 79 %
Platelets: 242 10*3/uL (ref 150–400)
RBC: 4.07 MIL/uL — ABNORMAL LOW (ref 4.22–5.81)
RDW: 11.9 % (ref 11.5–15.5)
WBC: 6.3 10*3/uL (ref 4.0–10.5)

## 2015-12-14 LAB — URINALYSIS, ROUTINE W REFLEX MICROSCOPIC
Bilirubin Urine: NEGATIVE
Glucose, UA: NEGATIVE mg/dL
Hgb urine dipstick: NEGATIVE
Ketones, ur: 15 mg/dL — AB
Leukocytes, UA: NEGATIVE
Nitrite: NEGATIVE
Protein, ur: NEGATIVE mg/dL
Specific Gravity, Urine: 1.02 (ref 1.005–1.030)
pH: 6 (ref 5.0–8.0)

## 2015-12-14 LAB — COMPREHENSIVE METABOLIC PANEL
ALT: 23 U/L (ref 17–63)
AST: 27 U/L (ref 15–41)
Albumin: 4 g/dL (ref 3.5–5.0)
Alkaline Phosphatase: 52 U/L (ref 38–126)
Anion gap: 9 (ref 5–15)
BUN: 10 mg/dL (ref 6–20)
CO2: 26 mmol/L (ref 22–32)
Calcium: 9.3 mg/dL (ref 8.9–10.3)
Chloride: 102 mmol/L (ref 101–111)
Creatinine, Ser: 1.03 mg/dL (ref 0.61–1.24)
GFR calc Af Amer: >60 mL/min (ref >60)
GFR calc non Af Amer: >60 mL/min (ref >60)
Glucose, Bld: 127 mg/dL — ABNORMAL HIGH (ref 65–99)
Potassium: 3.3 mmol/L — ABNORMAL LOW (ref 3.5–5.1)
Sodium: 137 mmol/L (ref 135–145)
Total Bilirubin: 2.6 mg/dL — ABNORMAL HIGH (ref 0.3–1.2)
Total Protein: 6.9 g/dL (ref 6.5–8.1)

## 2015-12-14 LAB — LIPASE, BLOOD: Lipase: 23 U/L (ref 11–51)

## 2015-12-14 LAB — GC/CHLAMYDIA PROBE AMP (~~LOC~~) NOT AT ARMC
Chlamydia: NEGATIVE
Neisseria Gonorrhea: NEGATIVE

## 2015-12-14 MED ORDER — METOCLOPRAMIDE HCL 5 MG/ML IJ SOLN
10.0000 mg | Freq: Once | INTRAMUSCULAR | Status: AC
  Administered 2015-12-14: 10 mg via INTRAVENOUS
  Filled 2015-12-14: qty 2

## 2015-12-14 MED ORDER — SODIUM CHLORIDE 0.9 % IV BOLUS (SEPSIS)
1000.0000 mL | Freq: Once | INTRAVENOUS | Status: AC
  Administered 2015-12-14: 1000 mL via INTRAVENOUS

## 2015-12-14 MED ORDER — POTASSIUM CHLORIDE CRYS ER 20 MEQ PO TBCR
40.0000 meq | EXTENDED_RELEASE_TABLET | Freq: Once | ORAL | Status: AC
  Administered 2015-12-14: 40 meq via ORAL
  Filled 2015-12-14: qty 2

## 2015-12-14 MED ORDER — GI COCKTAIL ~~LOC~~
30.0000 mL | Freq: Once | ORAL | Status: AC
  Administered 2015-12-14: 30 mL via ORAL
  Filled 2015-12-14: qty 30

## 2015-12-14 NOTE — ED Notes (Signed)
Pt completed PO challenge with no additional vomiting.

## 2015-12-14 NOTE — ED Provider Notes (Signed)
CSN: [id number]     Arrival date & time 12/14/15  1101 History   First MD Initiated Contact with Patient 12/14/15 1104     Chief Complaint  Patient presents with  . Abdominal Pain  . Emesis    HPI   Matthew Scott is a 35 y.o. male with a PMH of chronic abdominal pain, gastroparesis, nausea, and vomiting who presents to the ED with abdominal pain, nausea, and vomiting. He states his abdominal pain is epigastric and right sided. He reports he has had abdominal pain intermittently since he had his gallbladder removed 3 years ago. He notes his pain has been worse and constant over the past 3-4 days. He denies exacerbating factors. He denies alleviating factors. He reports associated nausea and vomiting. Per record review, patient was evaluated in the ED several times throughout the past week for this. On April 26, he had a negative CT abdomen and pelvis. He was subsequently seen on the 28th and 30th. He was discharged yesterday with medications for an antiemetic and PPI, however did not get these filled due to transportation issues. He denies change in the character of his symptoms, though states his pain and nausea and vomiting have been persistent. He reports one episode of loose stool today. He denies hematemesis, hematochezia, melena, dysuria, urgency, frequency, testicular pain/swelling, penile discharge/pain/swelling.  Past Medical History  Diagnosis Date  . Asthma   . Chronic abdominal pain   . Nausea and vomiting     chronic, recurrent  . Polysubstance abuse   . Gastroparesis   . 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 GASTRITIS 12/09/2009    Qualifier: Diagnosis of  By: Daphine Deutscher FNP, Zena Amos     Past Surgical History  Procedure Laterality Date  . Cholecystectomy  11/2009   Family History  Problem Relation Age of Onset  . Other Father   .  Diabetes Father   . Hypertension Father    Social History  Substance Use Topics  . Smoking status: Former Smoker -- 8 years    Types: Cigars    Quit date: 12/15/2014  . Smokeless tobacco: Never Used  . Alcohol Use: 0.0 oz/week    0 Standard drinks or equivalent per week     Comment: 12/16/2014 " I no Longer drink ,I quit about 2 years ago "     Review of Systems  Constitutional: Negative for fever and chills.  Gastrointestinal: Positive for nausea, vomiting, abdominal pain and diarrhea. Negative for constipation and blood in stool.  Genitourinary: Negative for dysuria, urgency, frequency, discharge, penile swelling, scrotal swelling, penile pain and testicular pain.  All other systems reviewed and are negative.     Allergies  Review of patient's allergies indicates no known allergies.  Home Medications   Prior to Admission medications   Medication Sig Start Date End Date Taking? Authorizing Provider  HYDROcodone-acetaminophen (NORCO/VICODIN) 5-325 MG tablet Take 1 tablet by mouth every 4 (four) hours as needed. Patient taking differently: Take 1 tablet by mouth every 4 (four) hours as needed for moderate pain or severe pain.  12/09/15   Barrett Henle, PA-C  methocarbamol (ROBAXIN) 500 MG tablet Take 1 tablet (500 mg total) by mouth 2 (two) times daily. 12/11/15   Fayrene Helper, PA-C  metoCLOPramide (REGLAN) 10 MG tablet Take 1 tablet (10 mg total) by  mouth every 6 (six) hours as needed for nausea or vomiting. 12/11/15   Fayrene Helper, PA-C  omeprazole (PRILOSEC) 20 MG capsule Take 1 capsule (20 mg total) by mouth daily. 12/13/15   Glynn Octave, MD  ondansetron (ZOFRAN) 4 MG tablet Take 1 tablet (4 mg total) by mouth every 6 (six) hours. 12/13/15   Glynn Octave, MD  polyethylene glycol (MIRALAX / GLYCOLAX) packet Take 17 g by mouth daily. Patient not taking: Reported on 04/24/2015 01/18/15   Maretta Bees, MD  potassium chloride (K-DUR) 10 MEQ tablet Take 1 tablet (10 mEq  total) by mouth 2 (two) times daily. 12/13/15   Glynn Octave, MD  sucralfate (CARAFATE) 1 g tablet Take 1 tablet (1 g total) by mouth 4 (four) times daily -  with meals and at bedtime. 12/13/15   Glynn Octave, MD  zolpidem (AMBIEN) 5 MG tablet Take 1 tablet (5 mg total) by mouth at bedtime as needed for sleep. Patient not taking: Reported on 04/24/2015 01/18/15   Maretta Bees, MD    BP 168/84 mmHg  Pulse 55  Temp(Src) 98.4 F (36.9 C) (Oral)  SpO2 100% Physical Exam  Constitutional: He is oriented to person, place, and time. He appears well-developed and well-nourished. No distress.  HENT:  Head: Normocephalic and atraumatic.  Right Ear: External ear normal.  Left Ear: External ear normal.  Nose: Nose normal.  Mouth/Throat: Uvula is midline, oropharynx is clear and moist and mucous membranes are normal.  Eyes: Conjunctivae, EOM and lids are normal. Pupils are equal, round, and reactive to light. Right eye exhibits no discharge. Left eye exhibits no discharge. No scleral icterus.  Neck: Normal range of motion. Neck supple.  Cardiovascular: Normal rate, regular rhythm, normal heart sounds, intact distal pulses and normal pulses.   Pulmonary/Chest: Effort normal and breath sounds normal. No respiratory distress. He has no wheezes. He has no rales.  Abdominal: Soft. Normal appearance and bowel sounds are normal. He exhibits no distension and no mass. There is tenderness. There is no rigidity, no rebound and no guarding.  TTP in RUQ and epigastrium. No rebound, guarding, or masses.  Musculoskeletal: Normal range of motion. He exhibits no edema or tenderness.  Neurological: He is alert and oriented to person, place, and time.  Skin: Skin is warm, dry and intact. No rash noted. He is not diaphoretic. No erythema. No pallor.  Psychiatric: He has a normal mood and affect. His speech is normal and behavior is normal.  Nursing note and vitals reviewed.   ED Course  Procedures (including  critical care time)  Labs Review Labs Reviewed  URINALYSIS, ROUTINE W REFLEX MICROSCOPIC (NOT AT [facility]) - Abnormal; Notable for the following:    Ketones, ur 15 (*)    All other components within normal limits  CBC WITH DIFFERENTIAL/PLATELET - Abnormal; Notable for the following:    RBC 4.07 (*)    Hemoglobin 12.6 (*)    HCT 36.8 (*)    All other components within normal limits  COMPREHENSIVE METABOLIC PANEL - Abnormal; Notable for the following:    Potassium 3.3 (*)    Glucose, Bld 127 (*)    Total Bilirubin 2.6 (*)    All other components within normal limits  LIPASE, BLOOD    Imaging Review Dg Abd Acute W/chest  12/13/2015  CLINICAL DATA:  Right upper quadrant pain and emesis for 6 days EXAM: DG ABDOMEN ACUTE W/ 1V CHEST COMPARISON:  12/09/2015 FINDINGS: Normal heart size. No pleural effusion or  edema. No airspace consolidation identified. The bowel gas pattern is nonobstructed. No dilated loops of small bowel. No free intraperitoneal air noted. Cholecystectomy clips are present within the right upper quadrant of the abdomen. IMPRESSION: 1. No active cardiopulmonary abnormalities. 2. Nonobstructive bowel gas pattern. Electronically Signed   By: Signa Kellaylor  Stroud M.D.   On: 12/13/2015 12:55   I have personally reviewed and evaluated these images and lab results as part of my medical decision-making.   EKG Interpretation None      MDM   Final diagnoses:  Abdominal pain, unspecified abdominal location  Non-intractable vomiting with nausea, vomiting of unspecified type    [age] year old male presents with epigastric and RUQ abdominal pain, nausea, and vomiting. Notes one episode of loose stool today. Denies hematemesis, hematochezia, melena, dysuria, urgency, frequency, testicular pain/swelling, penile discharge/pain/swelling. Per record review, patient was evaluated in the ED several times throughout the past week for the same symptoms. On April 26, he had a negative CT abdomen and  pelvis. He was subsequently seen on the 28th and 30th. He was discharged yesterday with prescriptions for zofran and protonix, however he never filled these. He notes his symptoms have persisted, however he denies change in the character of his symptoms.  Patient is afebrile. Vital signs stable. Abdomen soft, non-distended, with TTP in epigastrium and RUQ. No rebound, guarding, or masses.  Labs pending. Will give fluids, antiemetic, GI cocktail.  CBC negative for leukocytosis, hemoglobin stable. CMP remarkable for potassium 3.3, patient given oral potassium in the ED. Lipase within normal limits. UA negative for infection.  On reassessment of patient, he reports significant symptom improvement and is able to tolerate PO intake.   Case management consulted regarding follow-up and obtaining meds. Patient has an appointment scheduled with the [place] [facility] and can get his prescriptions filled there (antiemetic, PPI). Patient is non-toxic and well-appearing, feel he is stable for discharge. Do not feel additional imaging is indicated at this time given no change in symptoms since last evaluated with imaging. Patient to follow-up as above. Strict return precautions discussed. Patient verbalizes his understanding and is in agreement with plan.  BP 168/84 mmHg  Pulse 55  Temp(Src) 98.4 F (36.9 C) (Oral)  SpO2 100%       Mady Gemmalizabeth C Aleila Syverson, PA-C 12/14/15 1959  Alvira MondayErin Schlossman, MD 12/15/15 [phone]

## 2015-12-14 NOTE — Discharge Planning (Signed)
[facility].  EDCM spoke with Robyne PeersJane Brazeau, RN earlier to [facility] with [facility] success.  EDCM spoke with pt at bedside to inform him of upcoming appointment tomorrow and stress the importance of keeping appointment.  Pt verbalized understanding.

## 2015-12-14 NOTE — Discharge Instructions (Signed)
1. Medications: zofran, prilosec, usual home medications 2. Treatment: rest, drink plenty of fluids 3. Follow Up: please followup with the [place] [facility] as scheduled for discussion of your diagnoses and further evaluation after today's visit; if you do not have a primary care doctor use the phone number listed in your discharge paperwork to find one; please return to the ER for severe abdominal pain, persistent vomiting, new or worsening symptoms   Abdominal Pain, Adult Many things can cause belly (abdominal) pain. Most times, the belly pain is not dangerous. Many cases of belly pain can be watched and treated at home. HOME CARE   Do not take medicines that help you go poop (laxatives) unless told to by your doctor.  Only take medicine as told by your doctor.  Eat or drink as told by your doctor. Your doctor will tell you if you should be on a special diet. GET HELP IF:  You do not know what is causing your belly pain.  You have belly pain while you are sick to your stomach (nauseous) or have runny poop (diarrhea).  You have pain while you pee or poop.  Your belly pain wakes you up at night.  You have belly pain that gets worse or better when you eat.  You have belly pain that gets worse when you eat fatty foods.  You have a fever. GET HELP RIGHT AWAY IF:   The pain does not go away within 2 hours.  You keep throwing up (vomiting).  The pain changes and is only in the right or left part of the belly.  You have bloody or tarry looking poop. MAKE SURE YOU:   Understand these instructions.  Will watch your condition.  Will get help right away if you are not doing well or get worse.   This information is not intended to replace advice given to you by your health care provider. Make sure you discuss any questions you have with your health care provider.   Document Released: 01/18/2008 Document Revised: 08/22/2014 Document Reviewed: 04/10/2013 Elsevier  Interactive Patient Education [employer]2016 Elsevier Inc.

## 2015-12-14 NOTE — ED Notes (Signed)
Pt is in stable condition upon d/c and ambulates from ED. 

## 2015-12-14 NOTE — Telephone Encounter (Signed)
Message received form Radford PaxAmy Ferrero, RN CM requesting [facility]. He is currently in the ED and an appointment has been scheduled for 12/15/15 @ 1200 and the information was placed on the AVS.  Update provided to Samule Ohmamellia Woods, RN CM

## 2015-12-14 NOTE — ED Notes (Signed)
Pt arrives with c/o RUQ pain and emesis x1 week. [facility] and was seen here yesterday. Pt states he was feeling better yesterday after a GI cocktail and ate lunch and dinner. Pt didn't fill his rx yesterday. States he did attempt to take reglan today and vomited back up the meds. Pt received 100mcg of fentanyl and 8 mg of zofran.

## 2015-12-15 ENCOUNTER — Emergency Department (HOSPITAL_COMMUNITY): Payer: Self-pay

## 2015-12-15 ENCOUNTER — Ambulatory Visit: Payer: Self-pay | Attending: Family Medicine | Admitting: Family Medicine

## 2015-12-15 ENCOUNTER — Encounter: Payer: Self-pay | Admitting: Family Medicine

## 2015-12-15 ENCOUNTER — Emergency Department (HOSPITAL_COMMUNITY)
Admission: EM | Admit: 2015-12-15 | Discharge: 2015-12-15 | Disposition: A | Discharge status: Home / Self Care | Payer: Self-pay | Attending: Emergency Medicine | Admitting: Emergency Medicine

## 2015-12-15 ENCOUNTER — Encounter (HOSPITAL_COMMUNITY): Payer: Self-pay | Admitting: Emergency Medicine

## 2015-12-15 VITALS — BP 165/97 | HR 71 | Temp 98.6°F | Resp 18 | Ht 69.0 in | Wt 198.8 lb

## 2015-12-15 DIAGNOSIS — Z8619 Personal history of other infectious and parasitic diseases: Secondary | ICD-10-CM | POA: Insufficient documentation

## 2015-12-15 DIAGNOSIS — IMO0001 Reserved for inherently not codable concepts without codable children: Secondary | ICD-10-CM

## 2015-12-15 DIAGNOSIS — S62345A Nondisplaced fracture of base of fourth metacarpal bone, left hand, initial encounter for closed fracture: Secondary | ICD-10-CM | POA: Insufficient documentation

## 2015-12-15 DIAGNOSIS — R1115 Cyclical vomiting syndrome unrelated to migraine: Secondary | ICD-10-CM

## 2015-12-15 DIAGNOSIS — R112 Nausea with vomiting, unspecified: Secondary | ICD-10-CM | POA: Insufficient documentation

## 2015-12-15 DIAGNOSIS — Y9389 Activity, other specified: Secondary | ICD-10-CM | POA: Insufficient documentation

## 2015-12-15 DIAGNOSIS — Z79899 Other long term (current) drug therapy: Secondary | ICD-10-CM | POA: Insufficient documentation

## 2015-12-15 DIAGNOSIS — Y998 Other external cause status: Secondary | ICD-10-CM | POA: Insufficient documentation

## 2015-12-15 DIAGNOSIS — J45909 Unspecified asthma, uncomplicated: Secondary | ICD-10-CM | POA: Insufficient documentation

## 2015-12-15 DIAGNOSIS — G8929 Other chronic pain: Secondary | ICD-10-CM | POA: Insufficient documentation

## 2015-12-15 DIAGNOSIS — Y9289 Other specified places as the place of occurrence of the external cause: Secondary | ICD-10-CM | POA: Insufficient documentation

## 2015-12-15 DIAGNOSIS — I10 Essential (primary) hypertension: Secondary | ICD-10-CM | POA: Insufficient documentation

## 2015-12-15 DIAGNOSIS — Z8719 Personal history of other diseases of the digestive system: Secondary | ICD-10-CM | POA: Insufficient documentation

## 2015-12-15 DIAGNOSIS — R030 Elevated blood-pressure reading, without diagnosis of hypertension: Secondary | ICD-10-CM

## 2015-12-15 DIAGNOSIS — S6292XA Unspecified fracture of left wrist and hand, initial encounter for closed fracture: Secondary | ICD-10-CM

## 2015-12-15 DIAGNOSIS — W1839XA Other fall on same level, initial encounter: Secondary | ICD-10-CM | POA: Insufficient documentation

## 2015-12-15 DIAGNOSIS — R197 Diarrhea, unspecified: Secondary | ICD-10-CM | POA: Insufficient documentation

## 2015-12-15 DIAGNOSIS — G43A0 Cyclical vomiting, not intractable: Secondary | ICD-10-CM | POA: Insufficient documentation

## 2015-12-15 DIAGNOSIS — Z87891 Personal history of nicotine dependence: Secondary | ICD-10-CM | POA: Insufficient documentation

## 2015-12-15 DIAGNOSIS — F1220 Cannabis dependence, uncomplicated: Secondary | ICD-10-CM | POA: Insufficient documentation

## 2015-12-15 LAB — CBC
HCT: 36.5 % — ABNORMAL LOW (ref 39.0–52.0)
Hemoglobin: 12.4 g/dL — ABNORMAL LOW (ref 13.0–17.0)
MCH: 30.8 pg (ref 26.0–34.0)
MCHC: 34 g/dL (ref 30.0–36.0)
MCV: 90.6 fL (ref 78.0–100.0)
Platelets: 267 10*3/uL (ref 150–400)
RBC: 4.03 MIL/uL — ABNORMAL LOW (ref 4.22–5.81)
RDW: 11.8 % (ref 11.5–15.5)
WBC: 8.8 10*3/uL (ref 4.0–10.5)

## 2015-12-15 LAB — COMPREHENSIVE METABOLIC PANEL
ALT: 29 U/L (ref 17–63)
AST: 30 U/L (ref 15–41)
Albumin: 4.2 g/dL (ref 3.5–5.0)
Alkaline Phosphatase: 53 U/L (ref 38–126)
Anion gap: 12 (ref 5–15)
BUN: 8 mg/dL (ref 6–20)
CO2: 26 mmol/L (ref 22–32)
Calcium: 9.5 mg/dL (ref 8.9–10.3)
Chloride: 102 mmol/L (ref 101–111)
Creatinine, Ser: 1.02 mg/dL (ref 0.61–1.24)
GFR calc Af Amer: >60 mL/min (ref >60)
GFR calc non Af Amer: >60 mL/min (ref >60)
Glucose, Bld: 110 mg/dL — ABNORMAL HIGH (ref 65–99)
Potassium: 3.2 mmol/L — ABNORMAL LOW (ref 3.5–5.1)
Sodium: 140 mmol/L (ref 135–145)
Total Bilirubin: 2.6 mg/dL — ABNORMAL HIGH (ref 0.3–1.2)
Total Protein: 7.2 g/dL (ref 6.5–8.1)

## 2015-12-15 LAB — URINALYSIS, ROUTINE W REFLEX MICROSCOPIC
Bilirubin Urine: NEGATIVE
Glucose, UA: NEGATIVE mg/dL
Hgb urine dipstick: NEGATIVE
Ketones, ur: >80 mg/dL — AB
Leukocytes, UA: NEGATIVE
Nitrite: NEGATIVE
Protein, ur: NEGATIVE mg/dL
Specific Gravity, Urine: 1.028 (ref 1.005–1.030)
pH: 6 (ref 5.0–8.0)

## 2015-12-15 LAB — LIPASE, BLOOD: Lipase: 19 U/L (ref 11–51)

## 2015-12-15 MED ORDER — SODIUM CHLORIDE 0.9 % IV BOLUS (SEPSIS)
1000.0000 mL | Freq: Once | INTRAVENOUS | Status: AC
  Administered 2015-12-15: 1000 mL via INTRAVENOUS

## 2015-12-15 MED ORDER — TRAMADOL HCL 50 MG PO TABS: 50.0000 mg | ORAL_TABLET | Freq: Four times a day (QID) | ORAL | Status: DC | PRN

## 2015-12-15 MED ORDER — PROMETHAZINE HCL 25 MG/ML IJ SOLN
25.0000 mg | Freq: Once | INTRAMUSCULAR | Status: AC
  Administered 2015-12-15: 25 mg via INTRAVENOUS

## 2015-12-15 MED ORDER — OMEPRAZOLE 20 MG PO CPDR: 20.0000 mg | DELAYED_RELEASE_CAPSULE | Freq: Every day | ORAL | Status: DC

## 2015-12-15 MED ORDER — SCOPOLAMINE 1 MG/3DAYS TD PT72
1.0000 | MEDICATED_PATCH | TRANSDERMAL | Status: DC
  Administered 2015-12-15: 1.5 mg via TRANSDERMAL
  Filled 2015-12-15 (×2): qty 1

## 2015-12-15 MED ORDER — MORPHINE SULFATE (PF) 4 MG/ML IV SOLN
4.0000 mg | Freq: Once | INTRAVENOUS | Status: AC
  Administered 2015-12-15: 4 mg via INTRAVENOUS
  Filled 2015-12-15: qty 1

## 2015-12-15 MED ORDER — METOCLOPRAMIDE HCL 10 MG PO TABS: 10.0000 mg | ORAL_TABLET | Freq: Four times a day (QID) | ORAL | Status: DC | PRN

## 2015-12-15 MED ORDER — ONDANSETRON HCL 4 MG/2ML IJ SOLN
4.0000 mg | Freq: Once | INTRAMUSCULAR | Status: AC
  Administered 2015-12-15: 4 mg via INTRAVENOUS
  Filled 2015-12-15: qty 2

## 2015-12-15 MED ORDER — ONDANSETRON 4 MG PO TBDP: 4.0000 mg | ORAL_TABLET | Freq: Three times a day (TID) | ORAL | Status: DC | PRN

## 2015-12-15 MED ORDER — PROMETHAZINE HCL 25 MG RE SUPP: 25.0000 mg | Freq: Four times a day (QID) | RECTAL | Status: DC | PRN

## 2015-12-15 MED FILL — PROMETHAZINE 25 MG SUPP: 25 | 3 days supply | Qty: 12 | Fill #0

## 2015-12-15 MED FILL — ?OMEPRAZOLE DR 20 MG CAPSUL: 20 | 30 days supply | Qty: 30 | Fill #0

## 2015-12-15 MED FILL — METOCLOPRAMIDE 10 MG TABLET: 10 | 8 days supply | Qty: 30 | Fill #0

## 2015-12-15 NOTE — Discharge Instructions (Signed)
Call after [facility] for an outpatient appointment regarding hand fracture. Clear liquids only for 24 hours.   Nausea and Vomiting Nausea is a sick feeling that often comes before throwing up (vomiting). Vomiting is a reflex where stomach contents come out of your mouth. Vomiting can cause severe loss of body fluids (dehydration). Children and elderly adults can become dehydrated quickly, especially if they also have diarrhea. Nausea and vomiting are symptoms of a condition or disease. It is important to find the cause of your symptoms. CAUSES   Direct irritation of the stomach lining. This irritation can result from increased acid production (gastroesophageal reflux disease), infection, food poisoning, taking certain medicines (such as nonsteroidal anti-inflammatory drugs), alcohol use, or tobacco use.  Signals from the brain.These signals could be caused by a headache, heat exposure, an inner ear disturbance, increased pressure in the brain from injury, infection, a tumor, or a concussion, pain, emotional stimulus, or metabolic problems.  An obstruction in the gastrointestinal tract (bowel obstruction).  Illnesses such as diabetes, hepatitis, gallbladder problems, appendicitis, kidney problems, cancer, sepsis, atypical symptoms of a heart attack, or eating disorders.  Medical treatments such as chemotherapy and radiation.  Receiving medicine that makes you sleep (general anesthetic) during surgery. DIAGNOSIS Your caregiver may ask for tests to be done if the problems do not improve after a few days. Tests may also be done if symptoms are severe or if the reason for the nausea and vomiting is not clear. Tests may include:  Urine tests.  Blood tests.  Stool tests.  Cultures (to look for evidence of infection).  X-rays or other imaging studies. Test results can help your caregiver make decisions about treatment or the need for additional tests. TREATMENT You need to stay well hydrated.  Drink frequently but in small amounts.You may wish to drink water, sports drinks, clear broth, or eat frozen ice pops or gelatin dessert to help stay hydrated.When you eat, eating slowly may help prevent nausea.There are also some antinausea medicines that may help prevent nausea. HOME CARE INSTRUCTIONS   Take all medicine as directed by your caregiver.  If you do not have an appetite, do not force yourself to eat. However, you must continue to drink fluids.  If you have an appetite, eat a normal diet unless your caregiver tells you differently.  Eat a variety of complex carbohydrates (rice, wheat, potatoes, bread), lean meats, yogurt, fruits, and vegetables.  Avoid high-fat foods because they are more difficult to digest.  Drink enough water and fluids to keep your urine clear or pale yellow.  If you are dehydrated, ask your caregiver for specific rehydration instructions. Signs of dehydration may include:  Severe thirst.  Dry lips and mouth.  Dizziness.  Dark urine.  Decreasing urine frequency and amount.  Confusion.  Rapid breathing or pulse. SEEK IMMEDIATE MEDICAL CARE IF:   You have blood or brown flecks (like coffee grounds) in your vomit.  You have black or bloody stools.  You have a severe headache or stiff neck.  You are confused.  You have severe abdominal pain.  You have chest pain or trouble breathing.  You do not urinate at least once every 8 hours.  You develop cold or clammy skin.  You continue to vomit for longer than 24 to 48 hours.  You have a fever. MAKE SURE YOU:   Understand these instructions.  Will watch your condition.  Will get help right away if you are not doing well or get worse.  This information is not intended to replace advice given to you by your health care provider. Make sure you discuss any questions you have with your health care provider.   Document Released: 08/01/2005 Document Revised: 10/24/2011 Document  Reviewed: 12/29/2010 Elsevier Interactive Patient Education [employer]2016 Elsevier Inc.

## 2015-12-15 NOTE — Progress Notes (Signed)
Subjective:  Patient ID: Matthew Scott, male    DOB: 1981-01-01  Age: 35 y.o. MRN: [id number]  CC: Abdominal Pain; Hospitalization Follow-up; and Emesis   [name] is a 35 year old male with a history of cannabis abuse, cyclical vomiting syndrome who has been symptom-free since 04/2015 until he relapsed in his use of cannabis last month with resulting multiple ED presentations with nausea and vomiting. Abdominal CT during one of his visits revealed no acute abnormalities, normal appendix, 2 mm stone in upper pole of right kidney unchanged. He received IV fluids, IV antiemetics was prescribed oral antiemetics and different occasions with his last ED visit yesterday.  He presents to the clinic today with chills and vomiting and has been unable to keep anything down. He also never filled his prescriptions which he received from the ED due to the fact that he has been too sick to go to the pharmacy. According to the patient he last used cannabis 2 weeks ago. Denies fever, sinus pressure or tenderness, shortness of breath or chest pain; he however has abdominal pain. He is willing to receive IM Promethazine in the clinic.  Outpatient Prescriptions Prior to Visit  Medication Sig Dispense Refill  . HYDROcodone-acetaminophen (NORCO/VICODIN) 5-325 MG tablet Take 1 tablet by mouth every 4 (four) hours as needed. (Patient taking differently: Take 1 tablet by mouth every 4 (four) hours as needed for moderate pain or severe pain. ) 10 tablet 0  . methocarbamol (ROBAXIN) 500 MG tablet Take 1 tablet (500 mg total) by mouth 2 (two) times daily. 20 tablet 0  . ondansetron (ZOFRAN) 4 MG tablet Take 1 tablet (4 mg total) by mouth every 6 (six) hours. 12 tablet 0  . potassium chloride (K-DUR) 10 MEQ tablet Take 1 tablet (10 mEq total) by mouth 2 (two) times daily. 10 tablet 0  . metoCLOPramide (REGLAN) 10 MG tablet Take 1 tablet (10 mg total) by mouth every 6 (six) hours as needed for nausea or  vomiting. 30 tablet 0  . omeprazole (PRILOSEC) 20 MG capsule Take 1 capsule (20 mg total) by mouth daily. 30 capsule 0  . polyethylene glycol (MIRALAX / GLYCOLAX) packet Take 17 g by mouth daily. (Patient not taking: Reported on 04/24/2015) 14 each 0  . sucralfate (CARAFATE) 1 g tablet Take 1 tablet (1 g total) by mouth 4 (four) times daily -  with meals and at bedtime. (Patient not taking: Reported on 12/15/2015) 30 tablet 0  . zolpidem (AMBIEN) 5 MG tablet Take 1 tablet (5 mg total) by mouth at bedtime as needed for sleep. (Patient not taking: Reported on 04/24/2015) 7 tablet 0   No facility-administered medications prior to visit.    ROS Review of Systems  Constitutional: Positive for chills. Negative for activity change and appetite change.  HENT: Negative for sinus pressure and sore throat.   Eyes: Negative for visual disturbance.  Respiratory: Negative for cough, chest tightness and shortness of breath.   Cardiovascular: Negative for chest pain and leg swelling.  Gastrointestinal: Positive for nausea, vomiting and abdominal pain. Negative for diarrhea, constipation and abdominal distention.  Endocrine: Negative.   Genitourinary: Negative for dysuria.  Musculoskeletal: Negative for myalgias and joint swelling.  Skin: Negative for rash.  Allergic/Immunologic: Negative.   Neurological: Negative for weakness, light-headedness and numbness.  Psychiatric/Behavioral: Negative for suicidal ideas and dysphoric mood.    Objective:  BP 165/97 mmHg  Pulse 71  Temp(Src) 98.6 F (37 C) (Oral)  Resp 18  Ht  5\' 9"  (1.753 m)  Wt 198 lb 12.8 oz (90.175 kg)  BMI 29.34 kg/m2  SpO2 98%  BP/Weight 12/15/2015 12/14/2015 12/13/2015  Systolic BP 165 168 142  Diastolic BP 97 84 80  Wt. (Lbs) 198.8 - 198.1  BMI 29.34 - 29.24      Physical Exam  Constitutional: He is oriented to person, place, and time.  Acutely ill looking, noticed to have chills, vomiting  Cardiovascular: Normal rate, normal heart  sounds and intact distal pulses.   No murmur heard. Pulmonary/Chest: Effort normal and breath sounds normal. He has no wheezes. He has no rales. He exhibits no tenderness.  Abdominal: Soft. Bowel sounds are normal. He exhibits no distension and no mass. There is tenderness ( diffuse abdominal tenderness).  Musculoskeletal: Normal range of motion.  Neurological: He is alert and oriented to person, place, and time.    CMP Latest Ref Rng 12/14/2015 12/13/2015 12/11/2015  Glucose 65 - 99 mg/dL 161(W127(H) 960(A118(H) 540(J136(H)  BUN 6 - 20 mg/dL 10 11 15   Creatinine 0.61 - 1.24 mg/dL 8.111.03 9.140.98 7.820.95  Sodium 135 - 145 mmol/L 137 137 140  Potassium 3.5 - 5.1 mmol/L 3.3(L) 2.9(L) 3.4(L)  Chloride 101 - 111 mmol/L 102 95(L) 103  CO2 22 - 32 mmol/L 26 27 21(L)  Calcium 8.9 - 10.3 mg/dL 9.3 95.610.0 21.310.2  Total Protein 6.5 - 8.1 g/dL 6.9 8.0 0.8(M8.6(H)  Total Bilirubin 0.3 - 1.2 mg/dL 2.6(H) 3.6(H) 3.2(H)  Alkaline Phos 38 - 126 U/L 52 62 59  AST 15 - 41 U/L 27 29 32  ALT 17 - 63 U/L 23 26 23       Assessment & Plan:   1. Nausea and vomiting, intractability of vomiting not specified, unspecified vomiting type Advised to increase fluid intake, appropriate diet discussed. promethazine (PHENERGAN) injection 25 mg; Inject 1 mL (25 mg total) into the vein once.  2. Non-intractable cyclical vomiting with nausea Secondary to cannabis use Patient never picked up his medications from the pharmacy which could explain symptoms I have sent his prescriptions to the pharmacy in house and spoken with the pharmacy so the patient can obtain medications today prior to going home. We will observe him in the clinic prior to discharging him home. - metoCLOPramide (REGLAN) 10 MG tablet; Take 1 tablet (10 mg total) by mouth every 6 (six) hours as needed for nausea or vomiting.  Dispense: 30 tablet; Refill: 0 - omeprazole (PRILOSEC) 20 MG capsule; Take 1 capsule (20 mg total) by mouth daily.  Dispense: 30 capsule; Refill: 0 - promethazine  (PHENERGAN) 25 MG suppository; Place 1 suppository (25 mg total) rectally every 6 (six) hours as needed for nausea or vomiting.  Dispense: 12 each; Refill: 0  3. Severe tetrahydrocannabinol (THC) dependence (HCC) We'll provide him with community resources to help with counseling on substance abuse  4. Elevated blood pressure This could be secondary to current acute illness Will reassess at next office visit   Meds ordered this encounter  Medications  . promethazine (PHENERGAN) injection 25 mg    Sig:   . metoCLOPramide (REGLAN) 10 MG tablet    Sig: Take 1 tablet (10 mg total) by mouth every 6 (six) hours as needed for nausea or vomiting.    Dispense:  30 tablet    Refill:  0  . omeprazole (PRILOSEC) 20 MG capsule    Sig: Take 1 capsule (20 mg total) by mouth daily.    Dispense:  30 capsule    Refill:  0  .  promethazine (PHENERGAN) 25 MG suppository    Sig: Place 1 suppository (25 mg total) rectally every 6 (six) hours as needed for nausea or vomiting.    Dispense:  12 each    Refill:  0    Patient observed for 45 minutes after administration of IM Promethazine and he continued to vomit and have chills throughout his stay in the clinic - non emergent EMS called  Follow-up:  1 week   Jaclyn Shaggy MD

## 2015-12-15 NOTE — Progress Notes (Signed)
Patient's here for hospital f/up abd pain.  Patient states he started having chills today, but vomiting since hospital d/c.  Patient is concern with his iron being low.

## 2015-12-15 NOTE — ED Notes (Signed)
PO challenge started

## 2015-12-15 NOTE — Progress Notes (Signed)
Orthopedic Tech Progress Note Patient Details:  Pauline AusWillie E Railey 03/13/1981 [id number]  Ortho Devices Type of Ortho Device: Ace wrap, Ulna gutter splint Ortho Device/Splint Location: LUE Ortho Device/Splint Interventions: Ordered, Application   Jennye MoccasinHughes, Jaysten Essner Craig 12/15/2015, 7:57 PM

## 2015-12-15 NOTE — ED Notes (Addendum)
Per [facility], [facility] and abd pain. Pt was evaluated for the same yesterday. Pt also reports left hand pain from punching a wall this morning. Pt alert x4. NAD at this time. Pt reports zofran is not helping.

## 2015-12-16 NOTE — ED Provider Notes (Signed)
CSN: [id number]     Arrival date & time 12/15/15  1338 History   First MD Initiated Contact with Patient 12/15/15 1636     Chief Complaint  Patient presents with  . Emesis  . Abdominal Pain  . Hand Pain     HPI  Patient presents evaluation of nausea and vomiting. Sixth visit over the last week. Had an episode last fall. Multiple episodes was diagnosed with hyperemesis. Ultimately it sounds like this is very likely at [facility] related hyperemesis. He was heavily smoking marijuana last fall he had several month rest but from it. Started smoking again several weeks ago. About 2 weeks ago started having symptoms as not smoke since but has had multiple ER visits. Seen yesterday. I should did follow-up at [facility] was given IV fluids and Phenergan but CAD refractory vomiting. Was transferred here. Had a prolonged dining room time before my evaluation states is feeling much better as I evaluate him.  Past Medical History  Diagnosis Date  . Asthma   . Chronic abdominal pain   . Nausea and vomiting     chronic, recurrent  . Polysubstance abuse   . Gastroparesis   . 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 GASTRITIS 12/09/2009    Qualifier: Diagnosis of  By: Daphine Deutscher FNP, Zena Amos     Past Surgical History  Procedure Laterality Date  . Cholecystectomy  11/2009   Family History  Problem Relation Age of Onset  . Other Father   . Diabetes Father   . Hypertension Father    Social History  Substance Use Topics  . Smoking status: Former Smoker -- 8 years    Types: Cigars    Quit date: 12/15/2014  . Smokeless tobacco: Never Used  . Alcohol Use: 0.0 oz/week    0 Standard drinks or equivalent per week     Comment: 12/16/2014 " I no Longer drink ,I quit about 2 years ago "    Review of Systems  Constitutional: Negative for fever,  chills, diaphoresis, appetite change and fatigue.  HENT: Negative for mouth sores, sore throat and trouble swallowing.   Eyes: Negative for visual disturbance.  Respiratory: Negative for cough, chest tightness, shortness of breath and wheezing.   Cardiovascular: Negative for chest pain.  Gastrointestinal: Positive for nausea and diarrhea. Negative for vomiting, abdominal pain and abdominal distention.  Endocrine: Negative for polydipsia, polyphagia and polyuria.  Genitourinary: Negative for dysuria, frequency and hematuria.  Musculoskeletal: Negative for gait problem.  Skin: Negative for color change, pallor and rash.  Neurological: Negative for dizziness, syncope, light-headedness and headaches.  Hematological: Does not bruise/bleed easily.  Psychiatric/Behavioral: Negative for behavioral problems and confusion.      Allergies  Review of patient's allergies indicates no known allergies.  Home Medications   Prior to Admission medications   Medication Sig Start Date End Date Taking? Authorizing Provider  HYDROcodone-acetaminophen (NORCO/VICODIN) 5-325 MG tablet Take 1 tablet by mouth every 4 (four) hours as needed. Patient taking differently: Take 1 tablet by mouth every 4 (four) hours as needed for moderate pain or severe pain.  12/09/15  Yes Barrett Henle, PA-C  metoCLOPramide (REGLAN) 10 MG tablet Take 1 tablet (10 mg total) by mouth every 6 (six) hours as needed for nausea or vomiting. 12/15/15  Yes Jaclyn Shaggy, MD  methocarbamol (ROBAXIN) 500 MG tablet Take 1 tablet (500 mg total) by mouth 2 (two) times daily. Patient not taking: Reported on 12/15/2015 12/11/15   Fayrene HelperBowie Tran, PA-C  omeprazole (PRILOSEC) 20 MG capsule Take 1 capsule (20 mg total) by mouth daily. Patient not taking: Reported on 12/15/2015 12/15/15   Jaclyn ShaggyEnobong Amao, MD  ondansetron (ZOFRAN ODT) 4 MG disintegrating tablet Take 1 tablet (4 mg total) by mouth every 8 (eight) hours as needed for nausea. 12/15/15   Rolland PorterMark Kristeena Meineke,  MD  ondansetron (ZOFRAN) 4 MG tablet Take 1 tablet (4 mg total) by mouth every 6 (six) hours. Patient not taking: Reported on 12/15/2015 12/13/15   Glynn OctaveStephen Rancour, MD  potassium chloride (K-DUR) 10 MEQ tablet Take 1 tablet (10 mEq total) by mouth 2 (two) times daily. Patient not taking: Reported on 12/15/2015 12/13/15   Glynn OctaveStephen Rancour, MD  promethazine (PHENERGAN) 25 MG suppository Place 1 suppository (25 mg total) rectally every 6 (six) hours as needed for nausea or vomiting. Patient not taking: Reported on 12/15/2015 12/15/15   Jaclyn ShaggyEnobong Amao, MD  traMADol (ULTRAM) 50 MG tablet Take 1 tablet (50 mg total) by mouth every 6 (six) hours as needed. 12/15/15   Rolland PorterMark Tovia Kisner, MD   BP 132/76 mmHg  Pulse 61  Temp(Src) 99 F (37.2 C) (Oral)  Resp 18  SpO2 98% Physical Exam  Constitutional: He is oriented to person, place, and time. He appears well-developed and well-nourished. No distress.  HENT:  Head: Normocephalic.  Eyes: Conjunctivae are normal. Pupils are equal, round, and reactive to light. No scleral icterus.  Neck: Normal range of motion. Neck supple. No thyromegaly present.  Cardiovascular: Normal rate and regular rhythm.  Exam reveals no gallop and no friction rub.   No murmur heard. Pulmonary/Chest: Effort normal and breath sounds normal. No respiratory distress. He has no wheezes. He has no rales.  Abdominal: Soft. Bowel sounds are normal. He exhibits no distension. There is no tenderness. There is no rebound.  Musculoskeletal: Normal range of motion.  Neurological: He is alert and oriented to person, place, and time.  Skin: Skin is warm and dry. No rash noted.  Psychiatric: He has a normal mood and affect. His behavior is normal.    ED Course  Procedures (including critical care time) Labs Review Labs Reviewed  COMPREHENSIVE METABOLIC PANEL - Abnormal; Notable for the following:    Potassium 3.2 (*)    Glucose, Bld 110 (*)    Total Bilirubin 2.6 (*)    All other components within normal  limits  CBC - Abnormal; Notable for the following:    RBC 4.03 (*)    Hemoglobin 12.4 (*)    HCT 36.5 (*)    All other components within normal limits  URINALYSIS, ROUTINE W REFLEX MICROSCOPIC (NOT AT [facility]) - Abnormal; Notable for the following:    Color, Urine AMBER (*)    APPearance CLOUDY (*)    Ketones, ur >80 (*)    All other components within normal limits  LIPASE, BLOOD    Imaging Review Dg Hand Complete Left  12/15/2015  CLINICAL DATA:  Hand pain after punching a wall. EXAM: LEFT HAND - COMPLETE 3+ VIEW COMPARISON:  None. FINDINGS: There is an oblique nondisplaced fracture at the base of the left fourth metacarpal. There is no evidence of arthropathy or other focal bone abnormality. Soft tissues are unremarkable. IMPRESSION: 1. Oblique nondisplaced fracture at the base of the left fourth metacarpal. Electronically Signed   By: Elige KoHetal  Patel  On: 12/15/2015 13:57   I have personally reviewed and evaluated these images and lab results as part of my medical decision-making.   EKG Interpretation None      MDM   Final diagnoses:  Non-intractable vomiting with nausea, vomiting of unspecified type  Hand fracture, left, closed, initial encounter    Patient with sixth visit in a week. However is at first into the room after a prolonged wait patient states he is feeling better after the IM injections given to him at his physician's office. Was given additional fluids here. I have placed a scopolamine patch. This may give him better 24-hour control the symptoms. He freely admits that he started "trying to smoke pot again" a few weeks ago and his symptoms started after a week of heavy smoking. He states now "I'm convinced the past was causes this. 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 He is able to drink here and was discharged home.    Rolland Porter, MD 12/16/15 [phone]

## 2015-12-22 ENCOUNTER — Encounter: Payer: Self-pay | Admitting: Family Medicine

## 2015-12-22 ENCOUNTER — Ambulatory Visit: Payer: Self-pay | Attending: Family Medicine | Admitting: Family Medicine

## 2015-12-22 VITALS — BP 150/94 | HR 66 | Temp 98.4°F | Resp 16 | Ht 69.0 in | Wt 204.6 lb

## 2015-12-22 DIAGNOSIS — R6889 Other general symptoms and signs: Secondary | ICD-10-CM

## 2015-12-22 DIAGNOSIS — F1210 Cannabis abuse, uncomplicated: Secondary | ICD-10-CM | POA: Insufficient documentation

## 2015-12-22 DIAGNOSIS — S92302A Fracture of unspecified metatarsal bone(s), left foot, initial encounter for closed fracture: Secondary | ICD-10-CM

## 2015-12-22 DIAGNOSIS — S92345D Nondisplaced fracture of fourth metatarsal bone, left foot, subsequent encounter for fracture with routine healing: Secondary | ICD-10-CM | POA: Insufficient documentation

## 2015-12-22 DIAGNOSIS — I10 Essential (primary) hypertension: Secondary | ICD-10-CM | POA: Insufficient documentation

## 2015-12-22 DIAGNOSIS — G43A1 Cyclical vomiting, intractable: Secondary | ICD-10-CM | POA: Insufficient documentation

## 2015-12-22 DIAGNOSIS — Z79899 Other long term (current) drug therapy: Secondary | ICD-10-CM | POA: Insufficient documentation

## 2015-12-22 DIAGNOSIS — S92309A Fracture of unspecified metatarsal bone(s), unspecified foot, initial encounter for closed fracture: Secondary | ICD-10-CM | POA: Insufficient documentation

## 2015-12-22 DIAGNOSIS — R112 Nausea with vomiting, unspecified: Secondary | ICD-10-CM | POA: Insufficient documentation

## 2015-12-22 DIAGNOSIS — X58XXXD Exposure to other specified factors, subsequent encounter: Secondary | ICD-10-CM | POA: Insufficient documentation

## 2015-12-22 DIAGNOSIS — R1115 Cyclical vomiting syndrome unrelated to migraine: Secondary | ICD-10-CM

## 2015-12-22 LAB — BASIC METABOLIC PANEL
BUN: 6 mg/dL — ABNORMAL LOW (ref 7–25)
CO2: 28 mmol/L (ref 20–31)
Calcium: 9.3 mg/dL (ref 8.6–10.3)
Chloride: 105 mmol/L (ref 98–110)
Creat: 0.93 mg/dL (ref 0.60–1.35)
Glucose, Bld: 83 mg/dL (ref 65–99)
Potassium: 4 mmol/L (ref 3.5–5.3)
Sodium: 142 mmol/L (ref 135–146)

## 2015-12-22 LAB — TSH: TSH: 0.8 mIU/L (ref 0.40–4.50)

## 2015-12-22 MED ORDER — LISINOPRIL 5 MG PO TABS: 5.0000 mg | ORAL_TABLET | Freq: Every day | ORAL | Status: DC

## 2015-12-22 MED FILL — LISINOPRIL 5 MG TABLET: 5 | 90 days supply | Qty: 90 | Fill #0

## 2015-12-22 NOTE — Progress Notes (Signed)
Patient's here for 1wk f/up cyclical vomiting syndrome.  Patient reports feeling all right today, denies any pain.  Patient c/o of feeling cold all the time since his gallbladder was removed a while ago.  Patient requesting med refill

## 2015-12-22 NOTE — Patient Instructions (Signed)
Hypertension Hypertension, commonly called high blood pressure, is when the force of blood pumping through your arteries is too strong. Your arteries are the blood vessels that carry blood from your heart throughout your body. A blood pressure reading consists of a higher number over a lower number, such as 110/72. The higher number (systolic) is the pressure inside your arteries when your heart pumps. The lower number (diastolic) is the pressure inside your arteries when your heart relaxes. Ideally you want your blood pressure below 120/80. Hypertension forces your heart to work harder to pump blood. Your arteries may become narrow or stiff. Having untreated or uncontrolled hypertension can cause heart attack, stroke, kidney disease, and other problems. RISK FACTORS Some risk factors for high blood pressure are controllable. Others are not.  Risk factors you cannot control include:   Race. You may be at higher risk if you are African American.  Age. Risk increases with age.  Gender. Men are at higher risk than women before age 45 years. After age 65, women are at higher risk than men. Risk factors you can control include:  Not getting enough exercise or physical activity.  Being overweight.  Getting too much fat, sugar, calories, or salt in your diet.  Drinking too much alcohol. SIGNS AND SYMPTOMS Hypertension does not usually cause signs or symptoms. Extremely high blood pressure (hypertensive crisis) may cause headache, anxiety, shortness of breath, and nosebleed. DIAGNOSIS To check if you have hypertension, your health care provider will measure your blood pressure while you are seated, with your arm held at the level of your heart. It should be measured at least twice using the same arm. Certain conditions can cause a difference in blood pressure between your right and left arms. A blood pressure reading that is higher than normal on one occasion does not mean that you need treatment. If  it is not clear whether you have high blood pressure, you may be asked to return on a different day to have your blood pressure checked again. Or, you may be asked to monitor your blood pressure at home for 1 or more weeks. TREATMENT Treating high blood pressure includes making lifestyle changes and possibly taking medicine. Living a healthy lifestyle can help lower high blood pressure. You may need to change some of your habits. Lifestyle changes may include:  Following the DASH diet. This diet is high in fruits, vegetables, and whole grains. It is low in salt, red meat, and added sugars.  Keep your sodium intake below 2,300 mg per day.  Getting at least 30-45 minutes of aerobic exercise at least 4 times per week.  Losing weight if necessary.  Not smoking.  Limiting alcoholic beverages.  Learning ways to reduce stress. Your health care provider may prescribe medicine if lifestyle changes are not enough to get your blood pressure under control, and if one of the following is true:  You are 18-59 years of age and your systolic blood pressure is above 140.  You are 60 years of age or older, and your systolic blood pressure is above 150.  Your diastolic blood pressure is above 90.  You have diabetes, and your systolic blood pressure is over 140 or your diastolic blood pressure is over 90.  You have kidney disease and your blood pressure is above 140/90.  You have heart disease and your blood pressure is above 140/90. Your personal target blood pressure may vary depending on your medical conditions, your age, and other factors. HOME CARE INSTRUCTIONS    Have your blood pressure rechecked as directed by your health care provider.   Take medicines only as directed by your health care provider. Follow the directions carefully. Blood pressure medicines must be taken as prescribed. The medicine does not work as well when you skip doses. Skipping doses also puts you at risk for  problems.  Do not smoke.   Monitor your blood pressure at home as directed by your health care provider. SEEK MEDICAL CARE IF:   You think you are having a reaction to medicines taken.  You have recurrent headaches or feel dizzy.  You have swelling in your ankles.  You have trouble with your vision. SEEK IMMEDIATE MEDICAL CARE IF:  You develop a severe headache or confusion.  You have unusual weakness, numbness, or feel faint.  You have severe chest or abdominal pain.  You vomit repeatedly.  You have trouble breathing. MAKE SURE YOU:   Understand these instructions.  Will watch your condition.  Will get help right away if you are not doing well or get worse.   This information is not intended to replace advice given to you by your health care provider. Make sure you discuss any questions you have with your health care provider.   Document Released: 08/01/2005 Document Revised: 12/16/2014 Document Reviewed: 05/24/2013 Elsevier Interactive Patient Education 2016 Elsevier Inc.  

## 2015-12-22 NOTE — Progress Notes (Signed)
Subjective:  Patient ID: Matthew Scott, male    DOB: 08/01/1981  Age: 35 y.o. MRN: [id number]  CC: Emesis and Follow-up   [name] is a 35 year old male with a history of cannabis abuse, cyclical vomiting syndrome who has been symptom-free since 04/2015 until he relapsed in his use of cannabis last month with resulting multiple ED presentations with nausea and vomiting. He was referred to the ED at his last office visit and received IV hydration, scopolamine patch with improvement in his symptoms. He also had a left hand x-ray which revealed an oblique nondisplaced fracture at the base of the left fourth metacarpal; fracture was obtained by punching a wall and he was placed in the left hand.  He currently denies nausea or vomiting and has abstained from cannabis use since discharge.  He is currently wearing a cast on his left hand at the site of his fourth metatarsal fracture and is scheduled to wear this for 6-8 weeks; he was referred to orthopedics from the ED and has called the orthopedic office and is currently awaiting a call back.  He complains of cold intolerance; denies weight gain or weight loss, has no constipation or diarrhea-year-old waves changes.  Outpatient Prescriptions Prior to Visit  Medication Sig Dispense Refill  . metoCLOPramide (REGLAN) 10 MG tablet Take 1 tablet (10 mg total) by mouth every 6 (six) hours as needed for nausea or vomiting. 30 tablet 0  . traMADol (ULTRAM) 50 MG tablet Take 1 tablet (50 mg total) by mouth every 6 (six) hours as needed. 10 tablet 0  . HYDROcodone-acetaminophen (NORCO/VICODIN) 5-325 MG tablet Take 1 tablet by mouth every 4 (four) hours as needed. (Patient taking differently: Take 1 tablet by mouth every 4 (four) hours as needed for moderate pain or severe pain. ) 10 tablet 0  . ondansetron (ZOFRAN ODT) 4 MG disintegrating tablet Take 1 tablet (4 mg total) by mouth every 8 (eight) hours as needed for nausea. 6 tablet 0  .  ondansetron (ZOFRAN) 4 MG tablet Take 1 tablet (4 mg total) by mouth every 6 (six) hours. (Patient not taking: Reported on 12/15/2015) 12 tablet 0  . methocarbamol (ROBAXIN) 500 MG tablet Take 1 tablet (500 mg total) by mouth 2 (two) times daily. (Patient not taking: Reported on 12/15/2015) 20 tablet 0  . omeprazole (PRILOSEC) 20 MG capsule Take 1 capsule (20 mg total) by mouth daily. (Patient not taking: Reported on 12/15/2015) 30 capsule 0  . potassium chloride (K-DUR) 10 MEQ tablet Take 1 tablet (10 mEq total) by mouth 2 (two) times daily. (Patient not taking: Reported on 12/15/2015) 10 tablet 0  . promethazine (PHENERGAN) 25 MG suppository Place 1 suppository (25 mg total) rectally every 6 (six) hours as needed for nausea or vomiting. (Patient not taking: Reported on 12/15/2015) 12 each 0   No facility-administered medications prior to visit.    ROS Review of Systems  Constitutional: Negative for activity change and appetite change.  HENT: Negative for sinus pressure and sore throat.   Eyes: Negative for visual disturbance.  Respiratory: Negative for cough, chest tightness and shortness of breath.   Cardiovascular: Negative for chest pain and leg swelling.  Gastrointestinal: Negative for abdominal pain, diarrhea, constipation and abdominal distention.  Endocrine: Positive for cold intolerance.  Genitourinary: Negative for dysuria.  Musculoskeletal:       See hpi  Skin: Negative for rash.  Allergic/Immunologic: Negative.   Neurological: Negative for weakness, light-headedness and numbness.  Psychiatric/Behavioral: Negative  for suicidal ideas and dysphoric mood.    Objective:  BP 150/94 mmHg  Pulse 66  Temp(Src) 98.4 F (36.9 C) (Oral)  Resp 16  Ht  (1.753 m)  Wt 204 lb 9.6 oz (92.806 kg)  BMI 30.20 kg/m2  SpO2 99%  BP/Weight 12/22/2015 12/15/2015 12/15/2015  Systolic BP 150 132 165  Diastolic BP 94 76 97  Wt. (Lbs) 204.6 - 198.8  BMI 30.2 - 29.34      Physical Exam    Constitutional: He is oriented to person, place, and time. He appears well-developed and well-nourished.  Cardiovascular: Normal rate, normal heart sounds and intact distal pulses.   No murmur heard. Pulmonary/Chest: Effort normal and breath sounds normal. He has no wheezes. He has no rales. He exhibits no tenderness.  Abdominal: Soft. Bowel sounds are normal. He exhibits no distension and no mass. There is no tenderness.  Musculoskeletal: Normal range of motion.  Neurological: He is alert and oriented to person, place, and time.     Assessment & Plan:   1. Cold intolerance - TSH  2. Metatarsal fracture, left, closed, initial encounter Currently wearing a soft cast for 6-8 weeks he Patient is awaiting a call back from orthopedic office for follow-up  3. Intractable cyclical vomiting with nausea - Cannabis induced Resolved  4. Essential hypertension New diagnosis Commenced on antihypertensive Low-sodium, DASH diet - lisinopril (PRINIVIL,ZESTRIL) 5 MG tablet; Take 1 tablet (5 mg total) by mouth daily.  Dispense: 90 tablet; Refill: 3 - Basic Metabolic Panel   Meds ordered this encounter  Medications  . lisinopril (PRINIVIL,ZESTRIL) 5 MG tablet    Sig: Take 1 tablet (5 mg total) by mouth daily.    Dispense:  90 tablet    Refill:  3    Follow-up: Return in about 3 weeks (around 01/12/2016) for Follow-up of hypertension.   Jaclyn Shaggy MD

## 2015-12-28 ENCOUNTER — Telehealth: Payer: Self-pay

## 2015-12-28 NOTE — Telephone Encounter (Signed)
Placed call to patient, patient verified name and DOB. Patient was given lab results, verbalize understanding with no further questions. 

## 2015-12-28 NOTE — Telephone Encounter (Signed)
-----   Message from Jaclyn ShaggyEnobong Amao, MD sent at 12/23/2015  8:44 AM EDT ----- Please inform the patient that labs are normal. Thank you.

## 2016-01-12 ENCOUNTER — Encounter: Payer: Self-pay | Admitting: Family Medicine

## 2016-01-12 ENCOUNTER — Ambulatory Visit: Payer: Self-pay | Attending: Family Medicine | Admitting: Family Medicine

## 2016-01-12 VITALS — BP 138/85 | HR 57 | Temp 98.5°F | Resp 12 | Ht 69.0 in | Wt 208.2 lb

## 2016-01-12 DIAGNOSIS — G43A0 Cyclical vomiting, not intractable: Secondary | ICD-10-CM

## 2016-01-12 DIAGNOSIS — S92302A Fracture of unspecified metatarsal bone(s), left foot, initial encounter for closed fracture: Secondary | ICD-10-CM

## 2016-01-12 DIAGNOSIS — I10 Essential (primary) hypertension: Secondary | ICD-10-CM

## 2016-01-12 DIAGNOSIS — R1115 Cyclical vomiting syndrome unrelated to migraine: Secondary | ICD-10-CM

## 2016-01-12 MED ORDER — METOCLOPRAMIDE HCL 10 MG PO TABS: 10.0000 mg | ORAL_TABLET | Freq: Four times a day (QID) | ORAL | Status: DC | PRN

## 2016-01-12 MED ORDER — OMEPRAZOLE 20 MG PO CPDR: 20.0000 mg | DELAYED_RELEASE_CAPSULE | Freq: Every day | ORAL | Status: DC

## 2016-01-12 NOTE — Progress Notes (Signed)
Pt here for F/U for HTN. Pt denies any pain today. Pt has taken his lisinopril this monring. Pt needs a refill on omeprazole and metoclopramide.

## 2016-01-12 NOTE — Progress Notes (Signed)
Subjective:    Patient ID: Matthew Scott, male    DOB: 08/13/1981, 35 y.o.   MRN: [id number]  HPI He is a 35 year old male with a history of cannabis abuse, cyclical vomiting syndrome with multiple ED presentations with nausea and vomiting. Today he reports nausea and vomiting has since stopped but he is needing refill of his metoclopramide. He also had a left hand x-ray which revealed an oblique nondisplaced fracture at the base of the left fourth metacarpal; fracture was obtained by punching a wall.  He is currently wearing a cast on his left hand at the site of his fourth metatarsal fracture and is scheduled to wear this for 6-8 weeks; he was referred to orthopedics from the ED and has called the orthopedic office but was told he needed the orange card.  He was commenced on lisinopril at his last office visit and his blood pressure is better today.   Past Medical History  Diagnosis Date  . Asthma   . Chronic abdominal pain   . Nausea and vomiting     chronic, recurrent  . Polysubstance abuse   . Gastroparesis   . 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 GASTRITIS 12/09/2009    Qualifier: Diagnosis of  By: Daphine DeutscherMartin FNP, Zena AmosNykedtra      Past Surgical History  Procedure Laterality Date  . Cholecystectomy  11/2009    No Known Allergies  Current Outpatient Prescriptions on File Prior to Visit  Medication Sig Dispense Refill  . lisinopril (PRINIVIL,ZESTRIL) 5 MG tablet Take 1 tablet (5 mg total) by mouth daily. 90 tablet 3   No current facility-administered medications on file prior to visit.     Review of Systems  Constitutional: Negative for activity change and appetite change.  HENT: Negative for sinus pressure and sore throat.   Respiratory: Negative for chest tightness, shortness of breath and wheezing.   Cardiovascular: Negative  for chest pain and palpitations.  Gastrointestinal: Negative for abdominal pain, constipation and abdominal distention.  Genitourinary: Negative.   Musculoskeletal:       See hpi  Psychiatric/Behavioral: Negative for behavioral problems and dysphoric mood.       Objective: Filed Vitals:   01/12/16 1001  BP: 138/85  Pulse: 57  Temp: 98.5 F (36.9 C)  TempSrc: Oral  Resp: 12  Height: 5\' 9"  (1.753 m)  Weight: 208 lb 3.2 oz (94.439 kg)  SpO2: 100%      Physical Exam  Constitutional: He is oriented to person, place, and time. He appears well-developed and well-nourished.  Cardiovascular: Normal rate, normal heart sounds and intact distal pulses.   No murmur heard. Pulmonary/Chest: Effort normal and breath sounds normal. He has no wheezes. He has no rales. He exhibits no tenderness.  Abdominal: Soft. Bowel sounds are normal. He exhibits no distension and no mass. There is no tenderness.  Musculoskeletal:  Left forearm in cast  Neurological: He is alert and oriented to person, place, and time.          Assessment & Plan:  1. Metatarsal fracture, left, closed, initial encounter Currently wearing a soft cast for 6-8 weeks  [facility] to being seen by [facility]opedics  2. Intractable cyclical vomiting with nausea - Cannabis induced Resolved  3. Essential hypertension Controlled on lisinopril Low-sodium,  DASH diet - lisinopril (PRINIVIL,ZESTRIL) 5 MG tablet; Take 1 tablet (5 mg total) by mouth daily.  Dispense: 90 tablet; Refill: 3 - Basic Metabolic Panel  This note has been created with Education officer, environmental. Any transcriptional errors are unintentional.

## 2016-02-19 ENCOUNTER — Encounter (HOSPITAL_COMMUNITY): Payer: Self-pay | Admitting: Emergency Medicine

## 2016-02-19 ENCOUNTER — Emergency Department (HOSPITAL_COMMUNITY): Payer: Self-pay

## 2016-02-19 ENCOUNTER — Emergency Department (HOSPITAL_COMMUNITY)
Admission: EM | Admit: 2016-02-19 | Discharge: 2016-02-20 | Disposition: A | Discharge status: Home / Self Care | Payer: Self-pay | Attending: Emergency Medicine | Admitting: Emergency Medicine

## 2016-02-19 DIAGNOSIS — Z79899 Other long term (current) drug therapy: Secondary | ICD-10-CM | POA: Insufficient documentation

## 2016-02-19 DIAGNOSIS — F1290 Cannabis use, unspecified, uncomplicated: Secondary | ICD-10-CM | POA: Insufficient documentation

## 2016-02-19 DIAGNOSIS — J45909 Unspecified asthma, uncomplicated: Secondary | ICD-10-CM | POA: Insufficient documentation

## 2016-02-19 DIAGNOSIS — Z87891 Personal history of nicotine dependence: Secondary | ICD-10-CM | POA: Insufficient documentation

## 2016-02-19 DIAGNOSIS — R1110 Vomiting, unspecified: Secondary | ICD-10-CM

## 2016-02-19 DIAGNOSIS — Z8719 Personal history of other diseases of the digestive system: Secondary | ICD-10-CM | POA: Insufficient documentation

## 2016-02-19 DIAGNOSIS — R1013 Epigastric pain: Secondary | ICD-10-CM | POA: Insufficient documentation

## 2016-02-19 DIAGNOSIS — R112 Nausea with vomiting, unspecified: Secondary | ICD-10-CM | POA: Insufficient documentation

## 2016-02-19 LAB — COMPREHENSIVE METABOLIC PANEL
ALT: 33 U/L (ref 17–63)
AST: 27 U/L (ref 15–41)
Albumin: 5.3 g/dL — ABNORMAL HIGH (ref 3.5–5.0)
Alkaline Phosphatase: 79 U/L (ref 38–126)
Anion gap: 11 (ref 5–15)
BUN: 15 mg/dL (ref 6–20)
CO2: 23 mmol/L (ref 22–32)
Calcium: 10.2 mg/dL (ref 8.9–10.3)
Chloride: 106 mmol/L (ref 101–111)
Creatinine, Ser: 0.97 mg/dL (ref 0.61–1.24)
GFR calc Af Amer: >60 mL/min (ref >60)
GFR calc non Af Amer: >60 mL/min (ref >60)
Glucose, Bld: 162 mg/dL — ABNORMAL HIGH (ref 65–99)
Potassium: 4.4 mmol/L (ref 3.5–5.1)
Sodium: 140 mmol/L (ref 135–145)
Total Bilirubin: 1.8 mg/dL — ABNORMAL HIGH (ref 0.3–1.2)
Total Protein: 8.9 g/dL — ABNORMAL HIGH (ref 6.5–8.1)

## 2016-02-19 LAB — CBC
HCT: 40.7 % (ref 39.0–52.0)
Hemoglobin: 13.7 g/dL (ref 13.0–17.0)
MCH: 31.2 pg (ref 26.0–34.0)
MCHC: 33.7 g/dL (ref 30.0–36.0)
MCV: 92.7 fL (ref 78.0–100.0)
Platelets: 262 10*3/uL (ref 150–400)
RBC: 4.39 MIL/uL (ref 4.22–5.81)
RDW: 12.7 % (ref 11.5–15.5)
WBC: 19.1 10*3/uL — ABNORMAL HIGH (ref 4.0–10.5)

## 2016-02-19 LAB — LIPASE, BLOOD: Lipase: 18 U/L (ref 11–51)

## 2016-02-19 MED ORDER — SODIUM CHLORIDE 0.9 % IV SOLN: INTRAVENOUS | Status: DC

## 2016-02-19 MED ORDER — ONDANSETRON HCL 4 MG/2ML IJ SOLN
4.0000 mg | Freq: Once | INTRAMUSCULAR | Status: AC
  Administered 2016-02-19: 4 mg via INTRAVENOUS
  Filled 2016-02-19: qty 2

## 2016-02-19 MED ORDER — MORPHINE SULFATE (PF) 4 MG/ML IV SOLN
4.0000 mg | Freq: Once | INTRAVENOUS | Status: AC
  Administered 2016-02-19: 4 mg via INTRAVENOUS
  Filled 2016-02-19: qty 1

## 2016-02-19 MED ORDER — LORAZEPAM 2 MG/ML IJ SOLN
1.0000 mg | Freq: Once | INTRAMUSCULAR | Status: AC
  Administered 2016-02-19: 1 mg via INTRAVENOUS
  Filled 2016-02-19: qty 1

## 2016-02-19 MED ORDER — SODIUM CHLORIDE 0.9 % IV BOLUS (SEPSIS)
2000.0000 mL | Freq: Once | INTRAVENOUS | Status: AC
  Administered 2016-02-19: 2000 mL via INTRAVENOUS

## 2016-02-19 MED ORDER — DIATRIZOATE MEGLUMINE & SODIUM 66-10 % PO SOLN: 15.0000 mL | Freq: Once | ORAL | Status: DC

## 2016-02-19 MED ORDER — IOPAMIDOL (ISOVUE-300) INJECTION 61%
100.0000 mL | Freq: Once | INTRAVENOUS | Status: AC | PRN
Start: 2016-02-19 — End: 2016-02-19
  Administered 2016-02-19: 100 mL via INTRAVENOUS

## 2016-02-19 NOTE — ED Provider Notes (Signed)
CSN: [id number]     Arrival date & time 02/19/16  1917 History   First MD Initiated Contact with Patient 02/19/16 2116     Chief Complaint  Patient presents with  . Medication Reaction     (Consider location/radiation/quality/duration/timing/severity/associated sxs/prior Treatment) HPI Comments: Patient here complaining of 3 days of nonbilious emesis that began after he smoked marijuana. Diagnosed recently with hyperemesis from marijuana use was prescribed Reglan. No fever or chills. No diarrhea. Symptoms have been progressively worse and nothing makes them better. No treatment used for this prior to arrival Has had diffuse abdominal discomfort. States she cannot keep anything down. Does have a history of cholecystectomy  The history is provided by the patient.    Past Medical History  Diagnosis Date  . Asthma   . Chronic abdominal pain   . Nausea and vomiting     chronic, recurrent  . Polysubstance abuse   . Gastroparesis   . 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 GASTRITIS 12/09/2009    Qualifier: Diagnosis of  By: Daphine DeutscherMartin FNP, Zena AmosNykedtra     Past Surgical History  Procedure Laterality Date  . Cholecystectomy  11/2009   Family History  Problem Relation Age of Onset  . Other Father   . Diabetes Father   . Hypertension Father    Social History  Substance Use Topics  . Smoking status: Former Smoker -- 8 years    Types: Cigars    Quit date: 12/15/2014  . Smokeless tobacco: Never Used  . Alcohol Use: No     Comment: 12/16/2014 " I no Longer drink ,I quit about 2 years ago "    Review of Systems  All other systems reviewed and are negative.     Allergies  Review of patient's allergies indicates no known allergies.  Home Medications   Prior to Admission medications   Medication Sig Start Date End Date Taking? Authorizing  Provider  lisinopril (PRINIVIL,ZESTRIL) 5 MG tablet Take 1 tablet (5 mg total) by mouth daily. 12/22/15   Jaclyn ShaggyEnobong Amao, MD  metoCLOPramide (REGLAN) 10 MG tablet Take 1 tablet (10 mg total) by mouth every 6 (six) hours as needed for nausea or vomiting. 01/12/16   Jaclyn ShaggyEnobong Amao, MD  omeprazole (PRILOSEC) 20 MG capsule Take 1 capsule (20 mg total) by mouth daily. 01/12/16   Jaclyn ShaggyEnobong Amao, MD   BP 151/79 mmHg  Pulse 59  Temp(Src) 97.9 F (36.6 C) (Oral)  Resp 18  SpO2 96% Physical Exam  Constitutional: He is oriented to person, place, and time. He appears well-developed and well-nourished.  Non-toxic appearance. No distress.  HENT:  Head: Normocephalic and atraumatic.  Eyes: Conjunctivae, EOM and lids are normal. Pupils are equal, round, and reactive to light.  Neck: Normal range of motion. Neck supple. No tracheal deviation present. No thyroid mass present.  Cardiovascular: Normal rate, regular rhythm and normal heart sounds.  Exam reveals no gallop.   No murmur heard. Pulmonary/Chest: Effort normal and breath sounds normal. No stridor. No respiratory distress. He has no decreased breath sounds. He has no wheezes. He has no rhonchi. He has no rales.  Abdominal: Soft. Normal appearance and bowel sounds are normal. He exhibits no distension. There is tenderness in the epigastric area. There is no rebound and no CVA tenderness.  Musculoskeletal: Normal range of motion. He exhibits  no edema or tenderness.  Neurological: He is alert and oriented to person, place, and time. He has normal strength. No cranial nerve deficit or sensory deficit. GCS eye subscore is 4. GCS verbal subscore is 5. GCS motor subscore is 6.  Skin: Skin is warm and dry. No abrasion and no rash noted.  Psychiatric: He has a normal mood and affect. His speech is normal and behavior is normal.  Nursing note and vitals reviewed.   ED Course  Procedures (including critical care time) Labs Review Labs Reviewed  LIPASE, BLOOD   COMPREHENSIVE METABOLIC PANEL  CBC  URINALYSIS, ROUTINE W REFLEX MICROSCOPIC (NOT AT [facility])    Imaging Review No results found. I have personally reviewed and evaluated these images and lab results as part of my medical decision-making.   EKG Interpretation None      MDM   Final diagnoses:  None    Patient given IV fluids and medication for his pain. Leukocytosis noted an abdominal CT was negative for signs of obstruction. Patient feels better at this time and stable for discharge    Lorre NickAnthony Keondria Siever, MD 02/19/16 2358

## 2016-02-19 NOTE — Discharge Instructions (Signed)
Cannabis Use Disorder Cannabis use disorder is a mental disorder. It is not one-time or occasional use of cannabis, more commonly known as marijuana. Cannabis use disorder is the continued, nonmedical use of cannabis that interferes with normal life activities or causes health problems. People with cannabis use disorder get a feeling of extreme pleasure and relaxation from cannabis use. This "high" is very rewarding and causes people to use over and over.  The mind-altering ingredient in cannabis is know as THC. THC can also interfere with motor coordination, memory, judgment, and accurate sense of space and time. These effects can last for a few days after using cannabis. Regular heavy cannabis use can cause long-lasting problems with thinking and learning. In young people, these problems may be permanent. Cannabis sometimes causes severe anxiety, paranoia, or visual hallucinations. Man-made (synthetic) cannabis-like drugs, such as "spice" and "K2," cause the same effects as THC but are much stronger. Cannabis-like drugs can cause dangerously high blood pressure and heart rate.  Cannabis use disorder usually starts in the teenage years. It can trigger the development of schizophrenia. It is somewhat more common in men than women. People who have family members with the disorder or existing mental health issues such as depression and posttraumatic stress disorderare more likely to develop cannabis use disorder. People with cannabis use disorder are at higher risk for use of other drugs of abuse.  SIGNS AND SYMPTOMS Signs and symptoms of cannabis use disorder include:   Use of cannabis in larger amounts or over a longer period than intended.   Unsuccessful attempts to cut down or control cannabis use.   A lot of time spent obtaining, using, or recovering from the effects of cannabis.   A strong desire or urge to use cannabis (cravings).   Continued use of cannabis in spite of problems at work,  school, or home because of use.   Continued use of cannabis in spite of relationship problems because of use.  Giving up or cutting down on important life activities because of cannabis use.  Use of cannabis over and over even in situations when it is physically hazardous, such as when driving a car.   Continued use of cannabis in spite of a physical problem that is likely related to use. Physical problems can include:  Chronic cough.  Bronchitis.  Emphysema.  Throat and lung cancer.  Continued use of cannabis in spite of a mental problem that is likely related to use. Mental problems can include:  Psychosis.  Anxiety.  Difficulty sleeping.  Need to use more and more cannabis to get the same effect, or lessened effect over time with use of the same amount (tolerance).  Having withdrawal symptoms when cannabis use is stopped, or using cannabis to reduce or avoid withdrawal symptoms. Withdrawal symptoms include:  Irritability or anger.  Anxiety or restlessness.  Difficulty sleeping.  Loss of appetite or weight.  Aches and pains.  Shakiness.  Sweating.  Chills. DIAGNOSIS Cannabis use disorder is diagnosed by your health care provider. You may be asked questions about your cannabis use and how it affects your life. A physical exam may be done. A drug screen may be done. You may be referred to a mental health professional. The diagnosis of cannabis use disorder requires at least two symptoms within 12 months. The type of cannabis use disorder you have depends on the number of symptoms you have. The type may be:  Mild. Two or three signs and symptoms.   Moderate. Four or   five signs and symptoms.   Severe. Six or more signs and symptoms.  TREATMENT Treatment is usually provided by mental health professionals with training in substance use disorders. The following options are available:  Counseling or talk therapy. Talk therapy addresses the reasons you use  cannabis. It also addresses ways to keep you from using again. The goals of talk therapy include:  Identifying and avoiding triggers for use.  Learning how to handle cravings.  Replacing use with healthy activities.  Support groups. Support groups provide emotional support, advice, and guidance.  Medicine. Medicine is used to treat mental health issues that trigger cannabis use or that result from it. HOME CARE INSTRUCTIONS  Take medicines only as directed by your health care provider.  Check with your health care provider before starting any new medicines.  Keep all follow-up visits as directed by your health care provider. SEEK MEDICAL CARE IF:  You are not able to take your medicines as directed.  Your symptoms get worse. SEEK IMMEDIATE MEDICAL CARE IF: You have serious thoughts about hurting yourself or others. FOR MORE INFORMATION  National Institute on Drug Abuse: www.drugabuse.gov  Substance Abuse and Mental Health Services Administration: www.samhsa.gov   This information is not intended to replace advice given to you by your health care provider. Make sure you discuss any questions you have with your health care provider.   Document Released: 07/29/2000 Document Revised: 08/22/2014 Document Reviewed: 08/14/2013 Elsevier Interactive Patient Education 2016 Elsevier Inc.  

## 2016-02-19 NOTE — ED Notes (Signed)
Pt states that he was prescribed reglan for gastroparesis and was told not to smoke marijuana, which he has done twice since and has had adverse reactions. Pt has N/V and feels generally fatigued. Alert to voice. Oriented.

## 2016-02-19 NOTE — ED Notes (Signed)
Pt resting without complaint

## 2016-02-19 NOTE — ED Notes (Signed)
Patient request labs to be drawn with IV start. 

## 2016-02-21 ENCOUNTER — Emergency Department (HOSPITAL_COMMUNITY)
Admission: EM | Admit: 2016-02-21 | Discharge: 2016-02-21 | Disposition: A | Discharge status: Home / Self Care | Payer: Self-pay | Attending: Emergency Medicine | Admitting: Emergency Medicine

## 2016-02-21 ENCOUNTER — Encounter (HOSPITAL_COMMUNITY): Payer: Self-pay | Admitting: Emergency Medicine

## 2016-02-21 DIAGNOSIS — Z87891 Personal history of nicotine dependence: Secondary | ICD-10-CM | POA: Insufficient documentation

## 2016-02-21 DIAGNOSIS — R112 Nausea with vomiting, unspecified: Secondary | ICD-10-CM | POA: Insufficient documentation

## 2016-02-21 DIAGNOSIS — J45909 Unspecified asthma, uncomplicated: Secondary | ICD-10-CM | POA: Insufficient documentation

## 2016-02-21 DIAGNOSIS — Z79899 Other long term (current) drug therapy: Secondary | ICD-10-CM | POA: Insufficient documentation

## 2016-02-21 LAB — CBC
HCT: 36.6 % — ABNORMAL LOW (ref 39.0–52.0)
Hemoglobin: 12.7 g/dL — ABNORMAL LOW (ref 13.0–17.0)
MCH: 31.6 pg (ref 26.0–34.0)
MCHC: 34.7 g/dL (ref 30.0–36.0)
MCV: 91 fL (ref 78.0–100.0)
Platelets: 232 10*3/uL (ref 150–400)
RBC: 4.02 MIL/uL — ABNORMAL LOW (ref 4.22–5.81)
RDW: 12.7 % (ref 11.5–15.5)
WBC: 12 10*3/uL — ABNORMAL HIGH (ref 4.0–10.5)

## 2016-02-21 LAB — COMPREHENSIVE METABOLIC PANEL
ALT: 29 U/L (ref 17–63)
AST: 38 U/L (ref 15–41)
Albumin: 4.7 g/dL (ref 3.5–5.0)
Alkaline Phosphatase: 61 U/L (ref 38–126)
Anion gap: 11 (ref 5–15)
BUN: 17 mg/dL (ref 6–20)
CO2: 25 mmol/L (ref 22–32)
Calcium: 9.6 mg/dL (ref 8.9–10.3)
Chloride: 102 mmol/L (ref 101–111)
Creatinine, Ser: 0.96 mg/dL (ref 0.61–1.24)
GFR calc Af Amer: >60 mL/min (ref >60)
GFR calc non Af Amer: >60 mL/min (ref >60)
Glucose, Bld: 117 mg/dL — ABNORMAL HIGH (ref 65–99)
Potassium: 3.3 mmol/L — ABNORMAL LOW (ref 3.5–5.1)
Sodium: 138 mmol/L (ref 135–145)
Total Bilirubin: 2.9 mg/dL — ABNORMAL HIGH (ref 0.3–1.2)
Total Protein: 8 g/dL (ref 6.5–8.1)

## 2016-02-21 LAB — LIPASE, BLOOD: Lipase: 23 U/L (ref 11–51)

## 2016-02-21 MED ORDER — SODIUM CHLORIDE 0.9 % IV SOLN
1000.0000 mL | Freq: Once | INTRAVENOUS | Status: AC
  Administered 2016-02-21: 1000 mL via INTRAVENOUS

## 2016-02-21 MED ORDER — HALOPERIDOL LACTATE 5 MG/ML IJ SOLN
2.0000 mg | Freq: Once | INTRAMUSCULAR | Status: AC
  Administered 2016-02-21: 2 mg via INTRAVENOUS
  Filled 2016-02-21: qty 1

## 2016-02-21 MED ORDER — METOCLOPRAMIDE HCL 5 MG/ML IJ SOLN
10.0000 mg | Freq: Once | INTRAMUSCULAR | Status: AC
  Administered 2016-02-21: 10 mg via INTRAVENOUS
  Filled 2016-02-21: qty 2

## 2016-02-21 MED ORDER — SODIUM CHLORIDE 0.9 % IV SOLN
1000.0000 mL | INTRAVENOUS | Status: DC
  Administered 2016-02-21: 1000 mL via INTRAVENOUS

## 2016-02-21 MED ORDER — PROMETHAZINE HCL 25 MG RE SUPP: 25.0000 mg | Freq: Four times a day (QID) | RECTAL | Status: DC | PRN

## 2016-02-21 MED ORDER — ONDANSETRON HCL 4 MG/2ML IJ SOLN
4.0000 mg | Freq: Once | INTRAMUSCULAR | Status: AC
  Administered 2016-02-21: 4 mg via INTRAVENOUS
  Filled 2016-02-21: qty 2

## 2016-02-21 MED ORDER — KETOROLAC TROMETHAMINE 30 MG/ML IJ SOLN
30.0000 mg | Freq: Once | INTRAMUSCULAR | Status: AC
  Administered 2016-02-21: 30 mg via INTRAVENOUS
  Filled 2016-02-21: qty 1

## 2016-02-21 NOTE — ED Provider Notes (Signed)
CSN: [id number]     Arrival date & time 02/21/16  0456 History   First MD Initiated Contact with Patient 02/21/16 [phone]     Chief Complaint  Patient presents with  . Abdominal Pain      HPI Patient with a history of recurrent nausea vomiting or presents back to emergency department after being seen several days ago for ongoing nausea vomiting.  States mild decreased oral intake.  He also reports some right-sided abdominal pain.  He has had a prior cholecystectomy and underwent CT imaging 2 days ago which demonstrated no acute intra-abdominal pathology.  Denies fevers and chills.  No hematemesis.  Denies melena and hematochezia.  He currently is without a GI doctor.  His had similar symptoms for several years   Past Medical History  Diagnosis Date  . Asthma   . Chronic abdominal pain   . Nausea and vomiting     chronic, recurrent  . Polysubstance abuse   . Gastroparesis   . 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 GASTRITIS 12/09/2009    Qualifier: Diagnosis of  By: Daphine Deutscher FNP, Zena Amos     Past Surgical History  Procedure Laterality Date  . Cholecystectomy  11/2009   Family History  Problem Relation Age of Onset  . Other Father   . Diabetes Father   . Hypertension Father    Social History  Substance Use Topics  . Smoking status: Former Smoker -- 8 years    Types: Cigars    Quit date: 12/15/2014  . Smokeless tobacco: Never Used  . Alcohol Use: No     Comment: 12/16/2014 " I no Longer drink ,I quit about 2 years ago "    Review of Systems  All other systems reviewed and are negative.     Allergies  Review of patient's allergies indicates no known allergies.  Home Medications   Prior to Admission medications   Medication Sig Start Date End Date Taking? Authorizing Provider  lisinopril (PRINIVIL,ZESTRIL) 5 MG tablet Take 1  tablet (5 mg total) by mouth daily. 12/22/15  Yes Jaclyn Shaggy, MD  metoCLOPramide (REGLAN) 10 MG tablet Take 1 tablet (10 mg total) by mouth every 6 (six) hours as needed for nausea or vomiting. 01/12/16  Yes Jaclyn Shaggy, MD  omeprazole (PRILOSEC) 20 MG capsule Take 1 capsule (20 mg total) by mouth daily. 01/12/16  Yes Enobong Amao, MD   BP 156/85 mmHg  Pulse 59  Temp(Src) 98.6 F (37 C) (Oral)  Resp 26  Ht  (1.753 m)  SpO2 98% Physical Exam  Constitutional: He is oriented to person, place, and time. He appears well-developed and well-nourished.  HENT:  Head: Normocephalic and atraumatic.  Eyes: EOM are normal.  Neck: Normal range of motion.  Cardiovascular: Normal rate, regular rhythm, normal heart sounds and intact distal pulses.   Pulmonary/Chest: Effort normal and breath sounds normal. No respiratory distress.  Abdominal: Soft. He exhibits no distension. There is no tenderness.  Musculoskeletal: Normal range of motion.  Neurological: He is alert and oriented to person, place, and time.  Skin: Skin is warm and dry.  Psychiatric: He has a normal mood and affect. Judgment normal.  Nursing note and vitals reviewed.   ED Course  Procedures (including critical care time) Labs Review Labs Reviewed  COMPREHENSIVE METABOLIC PANEL - Abnormal; Notable  for the following:    Potassium 3.3 (*)    Glucose, Bld 117 (*)    Total Bilirubin 2.9 (*)    All other components within normal limits  CBC - Abnormal; Notable for the following:    WBC 12.0 (*)    RBC 4.02 (*)    Hemoglobin 12.7 (*)    HCT 36.6 (*)    All other components within normal limits  LIPASE, BLOOD    Imaging Review Ct Abdomen Pelvis W Contrast  02/19/2016  CLINICAL DATA:  Pt states that he was prescribed reglan for gastroparesis and was told not to smoke marijuana, which he has done twice since and has had adverse reactions. Pt has N/V and feels generally fatigued. Alert to voice. Oriented. WBC=19.1*^16800mL  ISOVUE-300 IOPAMIDOL (ISOVUE-300) INJECTION 61% EXAM: CT ABDOMEN AND PELVIS WITH CONTRAST TECHNIQUE: Multidetector CT imaging of the abdomen and pelvis was performed using the standard protocol following bolus administration of intravenous contrast. CONTRAST:  100mL ISOVUE-300 IOPAMIDOL (ISOVUE-300) INJECTION 61% COMPARISON:  CT 12/09/2015 FINDINGS: Lower chest: Lung bases are clear. Hepatobiliary: No focal hepatic lesion. Postcholecystectomy. No biliary dilatation. Pancreas: Pancreas is normal. No ductal dilatation. No pancreatic inflammation. Spleen: Normal spleen Adrenals/urinary tract: Adrenal glands are normal. Small nonobstructing calculus upper pole of the RIGHT kidney. No ureterolithiasis or obstructive uropathy. Bladder normal. Stomach/Bowel: Stomach, small bowel, appendix, and cecum are normal. The colon and rectosigmoid colon are normal. Vascular/Lymphatic: Abdominal aorta is normal caliber. There is no retroperitoneal or periportal lymphadenopathy. No pelvic lymphadenopathy. Reproductive: Prostate normal. Other: No free fluid. Musculoskeletal: No aggressive osseous lesion. IMPRESSION: 1. No acute findings in the abdomen pelvis. 2. Normal appendix. 3. Postcholecystectomy. 4. Small nonobstructing RIGHT renal calculus. Electronically Signed   By: Genevive BiStewart  Edmunds M.D.   On: 02/19/2016 23:52   I have personally reviewed and evaluated these images and lab results as part of my medical decision-making.   EKG Interpretation None      MDM   Final diagnoses:  None    7:08 AM Patient feels much better at this time.  Vitals stable.  CT scan from 02/19/2016 personally reviewed.  Hydrated in the emergency department.  Labs without significant abnormality.  Patient understands the importance of close primary care follow-up and understands to return to the ER for new or worsening symptoms    Azalia BilisKevin Marie Chow, MD 02/21/16 [phone]

## 2016-02-21 NOTE — ED Notes (Signed)
Pt having active vomiting after receiving antiemetics. MD notified.

## 2016-02-21 NOTE — ED Notes (Signed)
He arouses easily and tells me he feels "better".  He has phoned his father for a ride home.

## 2016-02-21 NOTE — ED Notes (Signed)
Per EMS pt reports nausea, vomiting, and right flank pain that began suddenly this morning at appx 4am. Pt reports being seen two days prior for same symptoms.

## 2016-02-21 NOTE — ED Notes (Signed)
Pt reporting improvement in pain and nausea after dose of Haldol. Currently awaiting lab results and disposition.

## 2016-02-21 NOTE — Discharge Instructions (Signed)

## 2016-02-21 NOTE — ED Notes (Signed)
Bed: WA14 Expected date:  Expected time:  Means of arrival:  Comments: EMS 

## 2016-02-22 ENCOUNTER — Other Ambulatory Visit: Payer: Self-pay | Admitting: Family Medicine

## 2016-02-22 ENCOUNTER — Telehealth: Payer: Self-pay | Admitting: Family Medicine

## 2016-02-22 MED FILL — ?OMEPRAZOLE DR 20 MG CAPSUL: 20 | 30 days supply | Qty: 30 | Fill #0

## 2016-02-22 MED FILL — METOCLOPRAMIDE 10 MG TABLET: 10 | 8 days supply | Qty: 30 | Fill #0

## 2016-02-22 NOTE — Telephone Encounter (Signed)
Medication Refill: metoCLOPramide (REGLAN) 10 MG tablet  Pt called stating he needs a refill of this Rx Pt states he has been vomiting all weekend

## 2016-02-23 MED ORDER — METOCLOPRAMIDE HCL 10 MG PO TABS: ORAL_TABLET | ORAL | Status: DC

## 2016-02-23 MED FILL — METOCLOPRAMIDE 10 MG TABLET: 10 | 10 days supply | Qty: 30 | Fill #0

## 2016-02-23 NOTE — Telephone Encounter (Signed)
Done

## 2016-02-27 ENCOUNTER — Emergency Department (HOSPITAL_COMMUNITY)
Admission: EM | Admit: 2016-02-27 | Discharge: 2016-02-27 | Disposition: A | Discharge status: Home / Self Care | Payer: Self-pay | Attending: Emergency Medicine | Admitting: Emergency Medicine

## 2016-02-27 ENCOUNTER — Encounter (HOSPITAL_COMMUNITY): Payer: Self-pay | Admitting: Emergency Medicine

## 2016-02-27 DIAGNOSIS — R112 Nausea with vomiting, unspecified: Secondary | ICD-10-CM | POA: Insufficient documentation

## 2016-02-27 DIAGNOSIS — R109 Unspecified abdominal pain: Secondary | ICD-10-CM | POA: Insufficient documentation

## 2016-02-27 DIAGNOSIS — Z87891 Personal history of nicotine dependence: Secondary | ICD-10-CM | POA: Insufficient documentation

## 2016-02-27 DIAGNOSIS — Z79899 Other long term (current) drug therapy: Secondary | ICD-10-CM | POA: Insufficient documentation

## 2016-02-27 DIAGNOSIS — J45909 Unspecified asthma, uncomplicated: Secondary | ICD-10-CM | POA: Insufficient documentation

## 2016-02-27 LAB — CBC WITH DIFFERENTIAL/PLATELET
Basophils Absolute: 0 10*3/uL (ref 0.0–0.1)
Basophils Relative: 0 %
Eosinophils Absolute: 0 10*3/uL (ref 0.0–0.7)
Eosinophils Relative: 0 %
HCT: 37.3 % — ABNORMAL LOW (ref 39.0–52.0)
Hemoglobin: 12.9 g/dL — ABNORMAL LOW (ref 13.0–17.0)
Lymphocytes Relative: 11 %
Lymphs Abs: 0.9 10*3/uL (ref 0.7–4.0)
MCH: 31.5 pg (ref 26.0–34.0)
MCHC: 34.6 g/dL (ref 30.0–36.0)
MCV: 91.2 fL (ref 78.0–100.0)
Monocytes Absolute: 0.2 10*3/uL (ref 0.1–1.0)
Monocytes Relative: 3 %
Neutro Abs: 7 10*3/uL (ref 1.7–7.7)
Neutrophils Relative %: 86 %
Platelets: 272 10*3/uL (ref 150–400)
RBC: 4.09 MIL/uL — ABNORMAL LOW (ref 4.22–5.81)
RDW: 12.1 % (ref 11.5–15.5)
WBC: 8.1 10*3/uL (ref 4.0–10.5)

## 2016-02-27 LAB — COMPREHENSIVE METABOLIC PANEL
ALT: 24 U/L (ref 17–63)
AST: 23 U/L (ref 15–41)
Albumin: 4.6 g/dL (ref 3.5–5.0)
Alkaline Phosphatase: 64 U/L (ref 38–126)
Anion gap: 11 (ref 5–15)
BUN: 13 mg/dL (ref 6–20)
CO2: 24 mmol/L (ref 22–32)
Calcium: 9.7 mg/dL (ref 8.9–10.3)
Chloride: 104 mmol/L (ref 101–111)
Creatinine, Ser: 1.07 mg/dL (ref 0.61–1.24)
GFR calc Af Amer: >60 mL/min (ref >60)
GFR calc non Af Amer: >60 mL/min (ref >60)
Glucose, Bld: 157 mg/dL — ABNORMAL HIGH (ref 65–99)
Potassium: 3.3 mmol/L — ABNORMAL LOW (ref 3.5–5.1)
Sodium: 139 mmol/L (ref 135–145)
Total Bilirubin: 1.6 mg/dL — ABNORMAL HIGH (ref 0.3–1.2)
Total Protein: 7.9 g/dL (ref 6.5–8.1)

## 2016-02-27 LAB — LIPASE, BLOOD: Lipase: 17 U/L (ref 11–51)

## 2016-02-27 MED ORDER — HALOPERIDOL LACTATE 5 MG/ML IJ SOLN
3.0000 mg | Freq: Once | INTRAMUSCULAR | Status: AC
  Administered 2016-02-27: 3 mg via INTRAVENOUS
  Filled 2016-02-27: qty 1

## 2016-02-27 MED ORDER — PROMETHAZINE HCL 25 MG RE SUPP: 25.0000 mg | Freq: Four times a day (QID) | RECTAL | Status: DC | PRN

## 2016-02-27 MED ORDER — SODIUM CHLORIDE 0.9 % IV BOLUS (SEPSIS)
1000.0000 mL | Freq: Once | INTRAVENOUS | Status: AC
  Administered 2016-02-27: 1000 mL via INTRAVENOUS

## 2016-02-27 MED ORDER — KETOROLAC TROMETHAMINE 15 MG/ML IJ SOLN
15.0000 mg | Freq: Once | INTRAMUSCULAR | Status: AC
  Administered 2016-02-27: 15 mg via INTRAVENOUS
  Filled 2016-02-27: qty 1

## 2016-02-27 NOTE — Discharge Instructions (Signed)
Pleas follow-up with your gastroenterologist for further evaluation and management. Please return immediately if any new or worsening signs or symptoms present.  Abdominal Pain, Adult Many things can cause abdominal pain. Usually, abdominal pain is not caused by a disease and will improve without treatment. It can often be observed and treated at home. Your health care provider will do a physical exam and possibly order blood tests and X-rays to help determine the seriousness of your pain. However, in many cases, more time must pass before a clear cause of the pain can be found. Before that point, your health care provider may not know if you need more testing or further treatment. HOME CARE INSTRUCTIONS Monitor your abdominal pain for any changes. The following actions may help to alleviate any discomfort you are experiencing:  Only take over-the-counter or prescription medicines as directed by your health care provider.  Do not take laxatives unless directed to do so by your health care provider.  Try a clear liquid diet (broth, tea, or water) as directed by your health care provider. Slowly move to a bland diet as tolerated. SEEK MEDICAL CARE IF:  You have unexplained abdominal pain.  You have abdominal pain associated with nausea or diarrhea.  You have pain when you urinate or have a bowel movement.  You experience abdominal pain that wakes you in the night.  You have abdominal pain that is worsened or improved by eating food.  You have abdominal pain that is worsened with eating fatty foods.  You have a fever. SEEK IMMEDIATE MEDICAL CARE IF:  Your pain does not go away within 2 hours.  You keep throwing up (vomiting).  Your pain is felt only in portions of the abdomen, such as the right side or the left lower portion of the abdomen.  You pass bloody or black tarry stools. MAKE SURE YOU:  Understand these instructions.  Will watch your condition.  Will get help right away  if you are not doing well or get worse.   This information is not intended to replace advice given to you by your health care provider. Make sure you discuss any questions you have with your health care provider.   Document Released: 05/11/2005 Document Revised: 04/22/2015 Document Reviewed: 04/10/2013 Elsevier Interactive Patient Education [employer]2016 [employer] Inc.

## 2016-02-28 ENCOUNTER — Emergency Department (HOSPITAL_COMMUNITY)
Admission: EM | Admit: 2016-02-28 | Discharge: 2016-02-28 | Disposition: A | Discharge status: Home / Self Care | Payer: Self-pay | Attending: Emergency Medicine | Admitting: Emergency Medicine

## 2016-02-28 ENCOUNTER — Encounter (HOSPITAL_COMMUNITY): Payer: Self-pay | Admitting: Emergency Medicine

## 2016-02-28 DIAGNOSIS — Z87891 Personal history of nicotine dependence: Secondary | ICD-10-CM | POA: Insufficient documentation

## 2016-02-28 DIAGNOSIS — Z79899 Other long term (current) drug therapy: Secondary | ICD-10-CM | POA: Insufficient documentation

## 2016-02-28 DIAGNOSIS — R112 Nausea with vomiting, unspecified: Secondary | ICD-10-CM | POA: Insufficient documentation

## 2016-02-28 DIAGNOSIS — F1290 Cannabis use, unspecified, uncomplicated: Secondary | ICD-10-CM | POA: Insufficient documentation

## 2016-02-28 DIAGNOSIS — J45909 Unspecified asthma, uncomplicated: Secondary | ICD-10-CM | POA: Insufficient documentation

## 2016-02-28 DIAGNOSIS — F1910 Other psychoactive substance abuse, uncomplicated: Secondary | ICD-10-CM | POA: Insufficient documentation

## 2016-02-28 MED ORDER — HALOPERIDOL LACTATE 5 MG/ML IJ SOLN
5.0000 mg | Freq: Once | INTRAMUSCULAR | Status: AC
  Administered 2016-02-28: 5 mg via INTRAVENOUS
  Filled 2016-02-28: qty 1

## 2016-02-28 MED ORDER — SODIUM CHLORIDE 0.9 % IV BOLUS (SEPSIS)
1000.0000 mL | Freq: Once | INTRAVENOUS | Status: AC
  Administered 2016-02-28: 1000 mL via INTRAVENOUS

## 2016-02-28 NOTE — ED Provider Notes (Signed)
CSN: [id number]     Arrival date & time 02/27/16  1225 History   First MD Initiated Contact with Patient 02/27/16 1241     Chief Complaint  Patient presents with  . Abdominal Pain  . Nausea  . Emesis   HPI   35 year old male presents with complaints of abdominal pain. Patient has a significant past medical history of gastroparesis, seemed same with numerous ED visits. Patient reports symptoms are identical, no other worsening findings. Patient denies any blood in his stool, diarrhea. He reports several episodes of vomiting.    Past Medical History  Diagnosis Date  . Asthma   . Chronic abdominal pain   . Nausea and vomiting     chronic, recurrent  . Polysubstance abuse   . Gastroparesis   . 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 GASTRITIS 12/09/2009    Qualifier: Diagnosis of  By: Daphine DeutscherMartin FNP, Zena AmosNykedtra     Past Surgical History  Procedure Laterality Date  . Cholecystectomy  11/2009   Family History  Problem Relation Age of Onset  . Other Father   . Diabetes Father   . Hypertension Father    Social History  Substance Use Topics  . Smoking status: Former Smoker -- 8 years    Types: Cigars    Quit date: 12/15/2014  . Smokeless tobacco: Never Used  . Alcohol Use: No     Comment: 12/16/2014 " I no Longer drink ,I quit about 2 years ago "    Review of Systems  All other systems reviewed and are negative.   Allergies  Review of patient's allergies indicates no known allergies.  Home Medications   Prior to Admission medications   Medication Sig Start Date End Date Taking? Authorizing Provider  lisinopril (PRINIVIL,ZESTRIL) 5 MG tablet Take 1 tablet (5 mg total) by mouth daily. 12/22/15   Jaclyn ShaggyEnobong Amao, MD  metoCLOPramide (REGLAN) 10 MG tablet TAKE 1 TABLET BY MOUTH EVERY 6 HOURS AS NEEDED FOR NAUSEA OR VOMITING. 02/23/16   Jaclyn ShaggyEnobong  Amao, MD  omeprazole (PRILOSEC) 20 MG capsule TAKE 1 CAPSULE BY MOUTH DAILY. 02/22/16   Jaclyn ShaggyEnobong Amao, MD  promethazine (PHENERGAN) 25 MG suppository Place 1 suppository (25 mg total) rectally every 6 (six) hours as needed for nausea or vomiting. Patient not taking: Reported on 02/28/2016 02/27/16   Eyvonne MechanicJeffrey Glorious Flicker, PA-C   BP 173/97 mmHg  Pulse 74  Temp(Src) 98.2 F (36.8 C) (Oral)  Resp 22  SpO2 98%   Physical Exam  Constitutional: He is oriented to person, place, and time. He appears well-developed and well-nourished.  HENT:  Head: Normocephalic and atraumatic.  Eyes: Conjunctivae are normal. Pupils are equal, round, and reactive to light. Right eye exhibits no discharge. Left eye exhibits no discharge. No scleral icterus.  Neck: Normal range of motion. No JVD present. No tracheal deviation present.  Pulmonary/Chest: Effort normal. No stridor.  Abdominal: Soft. There is tenderness.  Neurological: He is alert and oriented to person, place, and time. Coordination normal.  Skin: Skin is warm and dry. No rash noted. No erythema.  Psychiatric: He has a normal mood and affect. His behavior is normal. Judgment and thought content normal.  Nursing note and vitals reviewed.     ED Course  Procedures (including critical care time) Labs Review Labs Reviewed  CBC WITH DIFFERENTIAL/PLATELET - Abnormal;  Notable for the following:    RBC 4.09 (*)    Hemoglobin 12.9 (*)    HCT 37.3 (*)    All other components within normal limits  COMPREHENSIVE METABOLIC PANEL - Abnormal; Notable for the following:    Potassium 3.3 (*)    Glucose, Bld 157 (*)    Total Bilirubin 1.6 (*)    All other components within normal limits  LIPASE, BLOOD    Imaging Review No results found. I have personally reviewed and evaluated these images and lab results as part of my medical decision-making.   EKG Interpretation   Date/Time:  Saturday February 27 2016 13:07:55 EDT Ventricular Rate:  69 PR Interval:    QRS  Duration: 105 QT Interval:  407 QTC Calculation: 436 R Axis:   56 Text Interpretation:  Sinus rhythm Early repolarization Confirmed by Fayrene Fearing   MD, MARK ([id number]) on 02/27/2016 2:17:48 PM      MDM   Final diagnoses:  Abdominal pain, unspecified abdominal location    Labs:  Imaging:  Consults:  Therapeutics:  Discharge Meds:   Assessment/Plan: 50 showing male with a history of gastroparesis presents today with abdominal pain. No acute findings on exam that would indicate any other etiology. Patient reports this is identical to previous. Patient will receive EKG, Haldol, normal saline for his symptoms which she reports has worked in the past. Patient with significant improvement in symptoms, resting in exam bed upon reevaluation. Afebrile nontoxic, he will be discharged home with suppository antiemetics, strict return precautions. He verbalizes understanding and agreement today's plan had no further questions concerns the time discharge         Eyvonne Mechanic, PA-C 02/28/16 1632  Benjiman Core, MD 03/01/16 1729

## 2016-02-28 NOTE — ED Notes (Signed)
Bed: WA08 Expected date:  Expected time:  Means of arrival:  Comments: Ems- N/V, seen yesterday for same

## 2016-02-28 NOTE — ED Notes (Signed)
Pt c/o emesis, nausea, abdominal pain. Seen in ED yesterday for the same, sent with prescription for suppository phenergan, unable to obtain prescription phenergan until his father gets home from church. Has been diagnosed with cyclical vomiting in past. Pt denies recent marijuana use.

## 2016-02-28 NOTE — Discharge Instructions (Signed)
Please fill your prescription and take medication as directed.   Nausea and Vomiting Nausea is a sick feeling that often comes before throwing up (vomiting). Vomiting is a reflex where stomach contents come out of your mouth. Vomiting can cause severe loss of body fluids (dehydration). Children and elderly adults can become dehydrated quickly, especially if they also have diarrhea. Nausea and vomiting are symptoms of a condition or disease. It is important to find the cause of your symptoms. CAUSES   Direct irritation of the stomach lining. This irritation can result from increased acid production (gastroesophageal reflux disease), infection, food poisoning, taking certain medicines (such as nonsteroidal anti-inflammatory drugs), alcohol use, or tobacco use.  Signals from the brain.These signals could be caused by a headache, heat exposure, an inner ear disturbance, increased pressure in the brain from injury, infection, a tumor, or a concussion, pain, emotional stimulus, or metabolic problems.  An obstruction in the gastrointestinal tract (bowel obstruction).  Illnesses such as diabetes, hepatitis, gallbladder problems, appendicitis, kidney problems, cancer, sepsis, atypical symptoms of a heart attack, or eating disorders.  Medical treatments such as chemotherapy and radiation.  Receiving medicine that makes you sleep (general anesthetic) during surgery. DIAGNOSIS Your caregiver may ask for tests to be done if the problems do not improve after a few days. Tests may also be done if symptoms are severe or if the reason for the nausea and vomiting is not clear. Tests may include:  Urine tests.  Blood tests.  Stool tests.  Cultures (to look for evidence of infection).  X-rays or other imaging studies. Test results can help your caregiver make decisions about treatment or the need for additional tests. TREATMENT You need to stay well hydrated. Drink frequently but in small amounts.You  may wish to drink water, sports drinks, clear broth, or eat frozen ice pops or gelatin dessert to help stay hydrated.When you eat, eating slowly may help prevent nausea.There are also some antinausea medicines that may help prevent nausea. HOME CARE INSTRUCTIONS   Take all medicine as directed by your caregiver.  If you do not have an appetite, do not force yourself to eat. However, you must continue to drink fluids.  If you have an appetite, eat a normal diet unless your caregiver tells you differently.  Eat a variety of complex carbohydrates (rice, wheat, potatoes, bread), lean meats, yogurt, fruits, and vegetables.  Avoid high-fat foods because they are more difficult to digest.  Drink enough water and fluids to keep your urine clear or pale yellow.  If you are dehydrated, ask your caregiver for specific rehydration instructions. Signs of dehydration may include:  Severe thirst.  Dry lips and mouth.  Dizziness.  Dark urine.  Decreasing urine frequency and amount.  Confusion.  Rapid breathing or pulse. SEEK IMMEDIATE MEDICAL CARE IF:   You have blood or brown flecks (like coffee grounds) in your vomit.  You have black or bloody stools.  You have a severe headache or stiff neck.  You are confused.  You have severe abdominal pain.  You have chest pain or trouble breathing.  You do not urinate at least once every 8 hours.  You develop cold or clammy skin.  You continue to vomit for longer than 24 to 48 hours.  You have a fever. MAKE SURE YOU:   Understand these instructions.  Will watch your condition.  Will get help right away if you are not doing well or get worse.   This information is not intended to replace  advice given to you by your health care provider. Make sure you discuss any questions you have with your health care provider.   Document Released: 08/01/2005 Document Revised: 10/24/2011 Document Reviewed: 12/29/2010 Elsevier Interactive Patient  Education [employer].

## 2016-02-28 NOTE — ED Provider Notes (Signed)
CSN: [id number]     Arrival date & time 02/28/16  1418 History   First MD Initiated Contact with Patient 02/28/16 1502     Chief Complaint  Patient presents with  . Emesis     (Consider location/radiation/quality/duration/timing/severity/associated sxs/prior Treatment) HPI Comments: Patient presents to the ED with a chief complaint of persistent nausea and vomiting.  He has a hx of the same.  Multiple prior visits for the same.  Seen as recently as yesterday.  States that he felt well yesterday, but was unable to get his medication filled.  He denies any change in his symptoms.  States that he has some abdominal pain which he relates to the amount of vomiting he has had.  There are no modifying factors.  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  The history is provided by the patient. No language interpreter was used.    Past Medical History  Diagnosis Date  . Asthma   . Chronic abdominal pain   . Nausea and vomiting     chronic, recurrent  . Polysubstance abuse   . Gastroparesis   . 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 GASTRITIS 12/09/2009    Qualifier: Diagnosis of  By: Daphine DeutscherMartin FNP, Zena AmosNykedtra     Past Surgical History  Procedure Laterality Date  . Cholecystectomy  11/2009   Family History  Problem Relation Age of Onset  . Other Father   . Diabetes Father   . Hypertension Father    Social History  Substance Use Topics  . Smoking status: Former Smoker -- 8 years    Types: Cigars    Quit date: 12/15/2014  . Smokeless tobacco: Never Used  . Alcohol Use: No     Comment: 12/16/2014 " I no Longer drink ,I quit about 2 years ago "    Review of Systems  Gastrointestinal: Positive for nausea, vomiting and abdominal pain.  All other systems reviewed and are  negative.     Allergies  Review of patient's allergies indicates no known allergies.  Home Medications   Prior to Admission medications   Medication Sig Start Date End Date Taking? Authorizing Provider  lisinopril (PRINIVIL,ZESTRIL) 5 MG tablet Take 1 tablet (5 mg total) by mouth daily. 12/22/15   Jaclyn ShaggyEnobong Amao, MD  metoCLOPramide (REGLAN) 10 MG tablet TAKE 1 TABLET BY MOUTH EVERY 6 HOURS AS NEEDED FOR NAUSEA OR VOMITING. 02/23/16   Jaclyn ShaggyEnobong Amao, MD  omeprazole (PRILOSEC) 20 MG capsule TAKE 1 CAPSULE BY MOUTH DAILY. 02/22/16   Jaclyn ShaggyEnobong Amao, MD  promethazine (PHENERGAN) 25 MG suppository Place 1 suppository (25 mg total) rectally every 6 (six) hours as needed for nausea or vomiting. 02/27/16   Eyvonne MechanicJeffrey Hedges, PA-C   BP 168/90 mmHg  Pulse 71  Temp(Src) 99 F (37.2 C) (Oral)  Resp 18  SpO2 98% Physical Exam  Constitutional: He is oriented to person, place, and time. He appears well-developed and well-nourished.  HENT:  Head: Normocephalic and atraumatic.  Eyes: Conjunctivae and EOM are normal. Pupils are equal, round, and reactive to light. Right eye exhibits no discharge. Left eye exhibits no discharge. No scleral icterus.  Neck: Normal range of motion. Neck supple. No JVD present.  Cardiovascular: Normal rate, regular rhythm and normal heart sounds.  Exam reveals no  gallop and no friction rub.   No murmur heard. Pulmonary/Chest: Effort normal and breath sounds normal. No respiratory distress. He has no wheezes. He has no rales. He exhibits no tenderness.  Abdominal: Soft. He exhibits no distension and no mass. There is no tenderness. There is no rebound and no guarding.  Diffusely uncomfortable, but no focal tenderness  Musculoskeletal: Normal range of motion. He exhibits no edema or tenderness.  Neurological: He is alert and oriented to person, place, and time.  Skin: Skin is warm and dry.  Psychiatric: He has a normal mood and affect. His behavior is normal. Judgment and thought  content normal.  Nursing note and vitals reviewed.   ED Course  Procedures (including critical care time)   MDM   Final diagnoses:  Non-intractable vomiting with nausea, vomiting of unspecified type   Patient with nausea and vomiting x several days.  States that he hasn't been able to get his medications filled yet.  Given 2 L fluid and haldol.  No longer vomiting.  Recommend PCP follow-up and getting his medications filled.    Roxy Horseman, PA-C 02/28/16 1800  Rolland Porter, MD 03/07/16 [phone]

## 2016-03-04 ENCOUNTER — Ambulatory Visit: Payer: Self-pay | Admitting: Family Medicine

## 2016-03-08 ENCOUNTER — Emergency Department (HOSPITAL_COMMUNITY)
Admission: EM | Admit: 2016-03-08 | Discharge: 2016-03-09 | Disposition: A | Discharge status: Home / Self Care | Payer: Self-pay | Attending: Emergency Medicine | Admitting: Emergency Medicine

## 2016-03-08 ENCOUNTER — Encounter (HOSPITAL_COMMUNITY): Payer: Self-pay | Admitting: Emergency Medicine

## 2016-03-08 DIAGNOSIS — F1220 Cannabis dependence, uncomplicated: Secondary | ICD-10-CM | POA: Insufficient documentation

## 2016-03-08 DIAGNOSIS — I10 Essential (primary) hypertension: Secondary | ICD-10-CM | POA: Insufficient documentation

## 2016-03-08 DIAGNOSIS — R1115 Cyclical vomiting syndrome unrelated to migraine: Secondary | ICD-10-CM

## 2016-03-08 DIAGNOSIS — J45909 Unspecified asthma, uncomplicated: Secondary | ICD-10-CM | POA: Insufficient documentation

## 2016-03-08 DIAGNOSIS — Z79899 Other long term (current) drug therapy: Secondary | ICD-10-CM | POA: Insufficient documentation

## 2016-03-08 DIAGNOSIS — G43A0 Cyclical vomiting, not intractable: Secondary | ICD-10-CM | POA: Insufficient documentation

## 2016-03-08 LAB — CBC
HCT: 36.1 % — ABNORMAL LOW (ref 39.0–52.0)
Hemoglobin: 12.2 g/dL — ABNORMAL LOW (ref 13.0–17.0)
MCH: 31.5 pg (ref 26.0–34.0)
MCHC: 33.8 g/dL (ref 30.0–36.0)
MCV: 93.3 fL (ref 78.0–100.0)
Platelets: 276 10*3/uL (ref 150–400)
RBC: 3.87 MIL/uL — ABNORMAL LOW (ref 4.22–5.81)
RDW: 12.8 % (ref 11.5–15.5)
WBC: 10.2 10*3/uL (ref 4.0–10.5)

## 2016-03-08 LAB — COMPREHENSIVE METABOLIC PANEL
ALT: 31 U/L (ref 17–63)
AST: 31 U/L (ref 15–41)
Albumin: 4.7 g/dL (ref 3.5–5.0)
Alkaline Phosphatase: 62 U/L (ref 38–126)
Anion gap: 9 (ref 5–15)
BUN: 9 mg/dL (ref 6–20)
CO2: 26 mmol/L (ref 22–32)
Calcium: 10.2 mg/dL (ref 8.9–10.3)
Chloride: 108 mmol/L (ref 101–111)
Creatinine, Ser: 1.04 mg/dL (ref 0.61–1.24)
GFR calc Af Amer: >60 mL/min (ref >60)
GFR calc non Af Amer: >60 mL/min (ref >60)
Glucose, Bld: 130 mg/dL — ABNORMAL HIGH (ref 65–99)
Potassium: 3.8 mmol/L (ref 3.5–5.1)
Sodium: 143 mmol/L (ref 135–145)
Total Bilirubin: 2.2 mg/dL — ABNORMAL HIGH (ref 0.3–1.2)
Total Protein: 8 g/dL (ref 6.5–8.1)

## 2016-03-08 LAB — URINALYSIS, ROUTINE W REFLEX MICROSCOPIC
Bilirubin Urine: NEGATIVE
Glucose, UA: NEGATIVE mg/dL
Hgb urine dipstick: NEGATIVE
Ketones, ur: NEGATIVE mg/dL
Leukocytes, UA: NEGATIVE
Nitrite: NEGATIVE
Protein, ur: NEGATIVE mg/dL
Specific Gravity, Urine: 1.025 (ref 1.005–1.030)
pH: 6.5 (ref 5.0–8.0)

## 2016-03-08 LAB — LIPASE, BLOOD: Lipase: 19 U/L (ref 11–51)

## 2016-03-08 MED ORDER — SODIUM CHLORIDE 0.9 % IV BOLUS (SEPSIS)
1000.0000 mL | Freq: Once | INTRAVENOUS | Status: AC
  Administered 2016-03-08: 1000 mL via INTRAVENOUS

## 2016-03-08 MED ORDER — LORAZEPAM 2 MG/ML IJ SOLN: 1.0000 mg | Freq: Once | INTRAMUSCULAR | Status: DC

## 2016-03-08 MED ORDER — HALOPERIDOL LACTATE 5 MG/ML IJ SOLN
2.0000 mg | Freq: Once | INTRAMUSCULAR | Status: AC
  Administered 2016-03-08: 2 mg via INTRAVENOUS
  Filled 2016-03-08: qty 1

## 2016-03-08 NOTE — ED Triage Notes (Signed)
Per EMS: Pt states he has been having RLQ pain and NV x 2 days.  Was seen here on 7/16 for same.

## 2016-03-08 NOTE — ED Triage Notes (Signed)
20 g IV in lt wrist.  4mg  zofran given en route.

## 2016-03-08 NOTE — ED Notes (Signed)
Pt informed that if he wasn't able to provide urine, we may have to catheterize him.

## 2016-03-08 NOTE — ED Notes (Signed)
Pt sat down in the floor to lay down. He stated that it was more comfortable. This nurse explained that it was not safe to lay in the floor. Pt placed in bed.

## 2016-03-08 NOTE — ED Provider Notes (Signed)
[facility]-EMERGENCY DEPT Provider Note   CSN: [id number] Arrival date & time: 03/08/16  4098  First Provider Contact:  9:24 PM  By signing my name below, I, Evon Slack, attest that this documentation has been prepared under the direction and in the presence of Earley Favor, NP. Electronically Signed: Evon Slack, ED Scribe. 03/08/16. 9:29 PM.     History   Chief Complaint Chief Complaint  Patient presents with  . Nausea  . Emesis  . Abdominal Pain    [name] is a 35 y.o. male.  The history is provided by the patient. No language interpreter was used.   HPI Comments: Matthew Scott is a 35 y.o. male who presents to the Emergency Department complaining of nausea and vomiting onset this morning. Pt states that he is prescribed Reglan and phenergan. Pt doesn't report any modifying factors. Pt doesn't report any other related symptoms. Pt reports daily marijuana use. Pt states that he last smoked 1 day prior.   Past Medical History:  Diagnosis Date  . Asthma   . 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 Chronic abdominal pain   . GASTRITIS 12/09/2009   Qualifier: Diagnosis of  By: Daphine Deutscher FNP, Zena Amos    . Gastroparesis   . 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 Nausea and vomiting    chronic, recurrent  . Polysubstance abuse     Patient Active Problem List   Diagnosis Date Noted  . Metatarsal fracture 12/22/2015  . Essential hypertension 12/22/2015  . Night sweats 01/21/2015  . GERD (gastroesophageal reflux disease) 01/21/2015  . Gastritis 12/19/2014  . Hyperbilirubinemia 12/19/2014  . Elevated bilirubin   . Cyclical vomiting 12/14/2014  . Nausea & vomiting 12/14/2014  . Severe tetrahydrocannabinol (THC) dependence (HCC) 12/14/2014  . Alcohol abuse 12/14/2014  . Cyclic vomiting syndrome 12/06/2014  . 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 Gastroparesis 05/11/2012  . Abdominal pain 05/06/2012  . Polysubstance abuse 05/06/2012  . CHOLECYSTECTOMY, HX OF 11/25/2009    Past Surgical History:  Procedure Laterality Date  . CHOLECYSTECTOMY  11/2009       Home Medications    Prior to Admission medications   Medication Sig Start Date End Date Taking? Authorizing Provider  lisinopril (PRINIVIL,ZESTRIL) 5 MG tablet Take 1 tablet (5 mg total) by mouth daily. 12/22/15  Yes Jaclyn Shaggy, MD  metoCLOPramide (REGLAN) 10 MG tablet TAKE 1 TABLET BY MOUTH EVERY 6 HOURS AS NEEDED FOR NAUSEA OR VOMITING. 02/23/16  Yes Jaclyn Shaggy, MD  omeprazole (PRILOSEC) 20 MG capsule TAKE 1 CAPSULE BY MOUTH DAILY. 02/22/16  Yes Jaclyn Shaggy, MD  promethazine (PHENERGAN) 25 MG suppository Place 1 suppository (25 mg total) rectally every 6 (six) hours as needed for nausea or vomiting. 02/27/16  Yes Eyvonne Mechanic, PA-C    Family History Family History  Problem Relation Age of Onset  . Other Father   . Diabetes Father   . Hypertension Father     Social History Social History  Substance Use Topics  . Smoking status: Former Smoker    Years: 8.00    Types: Cigars    Quit date: 12/15/2014  . Smokeless tobacco: Never Used  . Alcohol use No     Comment: 12/16/2014 " I no Longer drink ,I quit about 2 years ago "     Allergies   Review of patient's  allergies indicates no known allergies.   Review of Systems Review of Systems  Constitutional: Negative for fever.  Gastrointestinal: Positive for nausea and vomiting.  All other systems reviewed and are negative.    Physical Exam Updated Vital Signs BP 159/85   Pulse 71   Temp 98.5 F (36.9 C) (Oral)   Resp 17   SpO2 96%   Physical Exam  Constitutional: He is oriented to person, place, and time. He appears well-developed and well-nourished. No distress.  HENT:  Head: Normocephalic and atraumatic.  Eyes: Conjunctivae and EOM are normal.  Neck: Neck  supple. No tracheal deviation present.  Cardiovascular: Normal rate.   Pulmonary/Chest: Effort normal. No respiratory distress.  Abdominal: Bowel sounds are normal. There is tenderness. There is no rebound.    Musculoskeletal: Normal range of motion.  Neurological: He is alert and oriented to person, place, and time.  Skin: Skin is warm and dry.  Psychiatric: He has a normal mood and affect. His behavior is normal.  Nursing note and vitals reviewed.    ED Treatments / Results  DIAGNOSTIC STUDIES: Oxygen Saturation is 96% on RA, normal by my interpretation.    COORDINATION OF CARE: 9:30 PM-Discussed treatment plan which includes CBC panel and CMP with pt at bedside and pt agreed to plan.    Labs (all labs ordered are listed, but only abnormal results are displayed) Labs Reviewed  COMPREHENSIVE METABOLIC PANEL - Abnormal; Notable for the following:       Result Value   Glucose, Bld 130 (*)    Total Bilirubin 2.2 (*)    All other components within normal limits  CBC - Abnormal; Notable for the following:    RBC 3.87 (*)    Hemoglobin 12.2 (*)    HCT 36.1 (*)    All other components within normal limits  LIPASE, BLOOD  URINALYSIS, ROUTINE W REFLEX MICROSCOPIC (NOT AT [facility])    EKG  EKG Interpretation None       Radiology No results found.  Procedures Procedures (including critical care time)  Medications Ordered in ED Medications - No data to display   Initial Impression / Assessment and Plan / ED Course  I have reviewed the triage vital signs and the nursing notes.  Pertinent labs & imaging results that were available during my care of the patient were reviewed by me and considered in my medical decision making (see chart for details).  Clinical Course   patient has had no further epos ides of vomiting since being medicated  Will try PO Challenge Recommend he stop smoking mariajuana    Final Clinical Impressions(s) / ED Diagnoses   Final diagnoses:    None    New Prescriptions New Prescriptions   No medications on file     I personally performed the services described in this documentation, which was scribed in my presence. The recorded information has been reviewed and is accurate.    Earley Favor, NP 03/08/16 2200    Earley Favor, NP 03/09/16 2778    Earley Favor, NP 03/09/16 2423    Leta Baptist, MD 03/09/16 1500

## 2016-03-08 NOTE — ED Notes (Signed)
Pt reminded of need for urine 

## 2016-03-09 NOTE — ED Notes (Signed)
Pt able to drink ice water without difficulty or nausea.

## 2016-03-09 NOTE — Discharge Instructions (Signed)
He was given a dose of medication.  Call Haldol for nausea.  No further episodes.  I recommend that you cut down or totally stop marijuana use.  Follow-up with your primary care physician as needed

## 2016-03-09 NOTE — ED Notes (Signed)
Pt ambulated to the restroom without any difficulty.

## 2016-03-10 ENCOUNTER — Encounter (HOSPITAL_COMMUNITY): Payer: Self-pay | Admitting: Emergency Medicine

## 2016-03-10 ENCOUNTER — Observation Stay (HOSPITAL_COMMUNITY)
Admission: EM | Admit: 2016-03-10 | Discharge: 2016-03-11 | Disposition: A | Discharge status: Home / Self Care | Payer: Self-pay | Attending: Internal Medicine | Admitting: Internal Medicine

## 2016-03-10 DIAGNOSIS — R1110 Vomiting, unspecified: Secondary | ICD-10-CM | POA: Diagnosis present

## 2016-03-10 DIAGNOSIS — Z79899 Other long term (current) drug therapy: Secondary | ICD-10-CM | POA: Insufficient documentation

## 2016-03-10 DIAGNOSIS — R109 Unspecified abdominal pain: Secondary | ICD-10-CM | POA: Diagnosis present

## 2016-03-10 DIAGNOSIS — D649 Anemia, unspecified: Principal | ICD-10-CM | POA: Diagnosis present

## 2016-03-10 DIAGNOSIS — R7989 Other specified abnormal findings of blood chemistry: Secondary | ICD-10-CM

## 2016-03-10 DIAGNOSIS — R1013 Epigastric pain: Secondary | ICD-10-CM

## 2016-03-10 DIAGNOSIS — R17 Unspecified jaundice: Secondary | ICD-10-CM | POA: Diagnosis present

## 2016-03-10 DIAGNOSIS — Z87891 Personal history of nicotine dependence: Secondary | ICD-10-CM | POA: Insufficient documentation

## 2016-03-10 DIAGNOSIS — R740 Nonspecific elevation of levels of transaminase and lactic acid dehydrogenase [LDH]: Secondary | ICD-10-CM | POA: Insufficient documentation

## 2016-03-10 DIAGNOSIS — J45909 Unspecified asthma, uncomplicated: Secondary | ICD-10-CM | POA: Insufficient documentation

## 2016-03-10 DIAGNOSIS — I10 Essential (primary) hypertension: Secondary | ICD-10-CM | POA: Insufficient documentation

## 2016-03-10 LAB — COMPREHENSIVE METABOLIC PANEL
ALT: 36 U/L (ref 17–63)
AST: 38 U/L (ref 15–41)
Albumin: 4.2 g/dL (ref 3.5–5.0)
Alkaline Phosphatase: 54 U/L (ref 38–126)
Anion gap: 12 (ref 5–15)
BUN: 11 mg/dL (ref 6–20)
CO2: 24 mmol/L (ref 22–32)
Calcium: 9.4 mg/dL (ref 8.9–10.3)
Chloride: 103 mmol/L (ref 101–111)
Creatinine, Ser: 1.11 mg/dL (ref 0.61–1.24)
GFR calc Af Amer: >60 mL/min (ref >60)
GFR calc non Af Amer: >60 mL/min (ref >60)
Glucose, Bld: 123 mg/dL — ABNORMAL HIGH (ref 65–99)
Potassium: 3.5 mmol/L (ref 3.5–5.1)
Sodium: 139 mmol/L (ref 135–145)
Total Bilirubin: 2.7 mg/dL — ABNORMAL HIGH (ref 0.3–1.2)
Total Protein: 6.9 g/dL (ref 6.5–8.1)

## 2016-03-10 LAB — CBC WITH DIFFERENTIAL/PLATELET
Basophils Absolute: 0 10*3/uL (ref 0.0–0.1)
Basophils Relative: 0 %
Eosinophils Absolute: 0 10*3/uL (ref 0.0–0.7)
Eosinophils Relative: 0 %
HCT: 34.6 % — ABNORMAL LOW (ref 39.0–52.0)
Hemoglobin: 11.4 g/dL — ABNORMAL LOW (ref 13.0–17.0)
Lymphocytes Relative: 9 %
Lymphs Abs: 0.8 10*3/uL (ref 0.7–4.0)
MCH: 31.1 pg (ref 26.0–34.0)
MCHC: 32.9 g/dL (ref 30.0–36.0)
MCV: 94.3 fL (ref 78.0–100.0)
Monocytes Absolute: 0.2 10*3/uL (ref 0.1–1.0)
Monocytes Relative: 2 %
Neutro Abs: 7.6 10*3/uL (ref 1.7–7.7)
Neutrophils Relative %: 89 %
Platelets: 249 10*3/uL (ref 150–400)
RBC: 3.67 MIL/uL — ABNORMAL LOW (ref 4.22–5.81)
RDW: 13 % (ref 11.5–15.5)
WBC: 8.6 10*3/uL (ref 4.0–10.5)

## 2016-03-10 LAB — I-STAT CG4 LACTIC ACID, ED
Lactic Acid, Venous: 3.35 mmol/L (ref 0.5–1.9)
Lactic Acid, Venous: 1.01 mmol/L (ref 0.5–1.9)

## 2016-03-10 LAB — URINALYSIS, ROUTINE W REFLEX MICROSCOPIC
Bilirubin Urine: NEGATIVE
Glucose, UA: NEGATIVE mg/dL
Hgb urine dipstick: NEGATIVE
Ketones, ur: 15 mg/dL — AB
Leukocytes, UA: NEGATIVE
Nitrite: NEGATIVE
Protein, ur: NEGATIVE mg/dL
Specific Gravity, Urine: 1.025 (ref 1.005–1.030)
pH: 6.5 (ref 5.0–8.0)

## 2016-03-10 LAB — RAPID URINE DRUG SCREEN, HOSP PERFORMED
Amphetamines: NOT DETECTED
Barbiturates: NOT DETECTED
Benzodiazepines: NOT DETECTED
Cocaine: NOT DETECTED
Opiates: NOT DETECTED
Tetrahydrocannabinol: POSITIVE — AB

## 2016-03-10 LAB — LIPASE, BLOOD: Lipase: 16 U/L (ref 11–51)

## 2016-03-10 MED ORDER — PROMETHAZINE HCL 25 MG RE SUPP: 25.0000 mg | Freq: Four times a day (QID) | RECTAL | Status: DC | PRN

## 2016-03-10 MED ORDER — HALOPERIDOL LACTATE 5 MG/ML IJ SOLN
2.0000 mg | Freq: Once | INTRAMUSCULAR | Status: AC
  Administered 2016-03-10: 2 mg via INTRAVENOUS
  Filled 2016-03-10: qty 1

## 2016-03-10 MED ORDER — ENOXAPARIN SODIUM 40 MG/0.4ML ~~LOC~~ SOLN
40.0000 mg | Freq: Every day | SUBCUTANEOUS | Status: DC
  Administered 2016-03-10 – 2016-03-11 (×2): 40 mg via SUBCUTANEOUS
  Filled 2016-03-10 (×2): qty 0.4

## 2016-03-10 MED ORDER — ACETAMINOPHEN 325 MG PO TABS: 650.0000 mg | ORAL_TABLET | Freq: Four times a day (QID) | ORAL | Status: DC | PRN

## 2016-03-10 MED ORDER — SODIUM CHLORIDE 0.9 % IV BOLUS (SEPSIS)
1000.0000 mL | Freq: Once | INTRAVENOUS | Status: AC
  Administered 2016-03-10: 1000 mL via INTRAVENOUS

## 2016-03-10 MED ORDER — SODIUM CHLORIDE 0.9 % IV SOLN
1000.0000 mL | INTRAVENOUS | Status: DC
  Administered 2016-03-10 (×2): 1000 mL via INTRAVENOUS

## 2016-03-10 MED ORDER — ONDANSETRON HCL 4 MG PO TABS: 4.0000 mg | ORAL_TABLET | Freq: Four times a day (QID) | ORAL | Status: DC | PRN

## 2016-03-10 MED ORDER — DOCUSATE SODIUM 100 MG PO CAPS
100.0000 mg | ORAL_CAPSULE | Freq: Two times a day (BID) | ORAL | Status: DC
  Administered 2016-03-10 – 2016-03-11 (×2): 100 mg via ORAL
  Filled 2016-03-10 (×2): qty 1

## 2016-03-10 MED ORDER — LORAZEPAM 1 MG PO TABS
1.0000 mg | ORAL_TABLET | Freq: Once | ORAL | Status: AC
  Administered 2016-03-10: 1 mg via ORAL
  Filled 2016-03-10: qty 1

## 2016-03-10 MED ORDER — ACETAMINOPHEN 650 MG RE SUPP: 650.0000 mg | Freq: Four times a day (QID) | RECTAL | Status: DC | PRN

## 2016-03-10 MED ORDER — CHLORPROMAZINE HCL 10 MG PO TABS
10.0000 mg | ORAL_TABLET | Freq: Four times a day (QID) | ORAL | Status: DC | PRN
  Filled 2016-03-10: qty 1

## 2016-03-10 MED ORDER — PANTOPRAZOLE SODIUM 40 MG PO TBEC
40.0000 mg | DELAYED_RELEASE_TABLET | Freq: Every day | ORAL | Status: DC
  Administered 2016-03-10 – 2016-03-11 (×2): 40 mg via ORAL
  Filled 2016-03-10 (×2): qty 1

## 2016-03-10 MED ORDER — SODIUM CHLORIDE 0.9 % IV SOLN
1000.0000 mL | Freq: Once | INTRAVENOUS | Status: AC
  Administered 2016-03-10: 1000 mL via INTRAVENOUS

## 2016-03-10 MED ORDER — ONDANSETRON HCL 4 MG/2ML IJ SOLN
4.0000 mg | Freq: Four times a day (QID) | INTRAMUSCULAR | Status: DC | PRN
  Administered 2016-03-10: 4 mg via INTRAVENOUS
  Filled 2016-03-10: qty 2

## 2016-03-10 MED ORDER — METOCLOPRAMIDE HCL 5 MG/ML IJ SOLN
10.0000 mg | Freq: Four times a day (QID) | INTRAMUSCULAR | Status: DC
  Administered 2016-03-10 – 2016-03-11 (×4): 10 mg via INTRAVENOUS
  Filled 2016-03-10 (×4): qty 2

## 2016-03-10 MED ORDER — LISINOPRIL 10 MG PO TABS
5.0000 mg | ORAL_TABLET | Freq: Every day | ORAL | Status: DC
  Administered 2016-03-10 – 2016-03-11 (×2): 5 mg via ORAL
  Filled 2016-03-10 (×2): qty 1

## 2016-03-10 MED ORDER — METOCLOPRAMIDE HCL 5 MG/ML IJ SOLN
10.0000 mg | Freq: Once | INTRAMUSCULAR | Status: AC
  Administered 2016-03-10: 10 mg via INTRAVENOUS
  Filled 2016-03-10: qty 2

## 2016-03-10 MED ORDER — KETOROLAC TROMETHAMINE 30 MG/ML IJ SOLN
30.0000 mg | Freq: Once | INTRAMUSCULAR | Status: AC
  Administered 2016-03-10: 30 mg via INTRAVENOUS
  Filled 2016-03-10: qty 1

## 2016-03-10 NOTE — ED Notes (Signed)
Called to the room by the pt, stating he could not hold water down.Pt states he drunk the whole cup of water.  Emesis bag held approximately of clear colored emesis. Pt asked for another cup of water, which as given to the pt.

## 2016-03-10 NOTE — ED Notes (Signed)
Bed: WA07 Expected date:  Expected time:  Means of arrival:  Comments: 35 y/o M NV

## 2016-03-10 NOTE — ED Provider Notes (Signed)
[facility]-EMERGENCY DEPT Provider Note   CSN: [id number] Arrival date & time: 03/10/16  0211  First Provider Contact:  None    By signing my name below, I, Matthew Scott, attest that this documentation has been prepared under the direction and in the presence of Matthew Booze, MD . Electronically Signed: Majel Scott, Scribe. 03/10/2016. 4:06 AM.  History   Chief Complaint Chief Complaint  Patient presents with  . Nausea   HPI Comments: Matthew Scott is a 35 y.o. male with PMHx of chronic abdominal pain and gastritis, who presents to the Emergency Department complaining of gradually worsening, 8/10, epigastric abdominal pain and nausea that began yesterday and worsened this morning. Pt reports multiple episodes of vomiting that also began this morning; he states he woke up from sleep due to vomiting. He states he feels worse after episodes of vomiting. Pt reports he was seen in the ED yesterday for similar symptoms; he notes he felt better upon discharge. Pt notes hx of smoking marijuana daily; though, he denies hx of smoking cigarettes and drinking EtOH. He also denies fever, chills and diaphoresis.   The history is provided by the patient. No language interpreter was used.   Past Medical History:  Diagnosis Date  . Asthma   . 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 Chronic abdominal pain   . GASTRITIS 12/09/2009   Qualifier: Diagnosis of  By: Daphine Deutscher FNP, Zena Amos    . Gastroparesis   . 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 Nausea and vomiting    chronic, recurrent  . Polysubstance abuse     Patient Active Problem List   Diagnosis Date Noted  . Metatarsal fracture 12/22/2015  . Essential hypertension 12/22/2015  . Night sweats 01/21/2015  . GERD (gastroesophageal reflux disease) 01/21/2015  . Gastritis 12/19/2014  . Hyperbilirubinemia 12/19/2014  . Elevated bilirubin   . Cyclical  vomiting 12/14/2014  . Nausea & vomiting 12/14/2014  . Severe tetrahydrocannabinol (THC) dependence (HCC) 12/14/2014  . Alcohol abuse 12/14/2014  . Cyclic vomiting syndrome 12/06/2014  . 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 Gastroparesis 05/11/2012  . Abdominal pain 05/06/2012  . Polysubstance abuse 05/06/2012  . CHOLECYSTECTOMY, HX OF 11/25/2009    Past Surgical History:  Procedure Laterality Date  . CHOLECYSTECTOMY  11/2009    Home Medications    Prior to Admission medications   Medication Sig Start Date End Date Taking? Authorizing Provider  lisinopril (PRINIVIL,ZESTRIL) 5 MG tablet Take 1 tablet (5 mg total) by mouth daily. 12/22/15  Yes Jaclyn Shaggy, MD  metoCLOPramide (REGLAN) 10 MG tablet TAKE 1 TABLET BY MOUTH EVERY 6 HOURS AS NEEDED FOR NAUSEA OR VOMITING. 02/23/16  Yes Jaclyn Shaggy, MD  omeprazole (PRILOSEC) 20 MG capsule TAKE 1 CAPSULE BY MOUTH DAILY. 02/22/16  Yes Jaclyn Shaggy, MD  promethazine (PHENERGAN) 25 MG suppository Place 1 suppository (25 mg total) rectally every 6 (six) hours as needed for nausea or vomiting. 02/27/16  Yes Eyvonne Mechanic, PA-C    Family History Family History  Problem Relation Age of Onset  . Other Father   . Diabetes Father   . Hypertension Father     Social History Social History  Substance Use Topics  . Smoking status: Former Smoker    Years: 8.00    Types: Cigars    Quit date: 12/15/2014  . Smokeless tobacco: Never Used  .  Alcohol use No     Comment: 12/16/2014 " I no Longer drink ,I quit about 2 years ago "     Allergies   Review of patient's allergies indicates no known allergies.   Review of Systems Review of Systems  Constitutional: Negative for chills, diaphoresis and fever.  Gastrointestinal: Positive for abdominal pain (epigastric), nausea and vomiting.   Physical Exam Updated Vital Signs BP 150/98 (BP Location: Left Arm)   Pulse 62   Temp 98.5 F (36.9 C) (Oral)   Resp 12    Ht  (1.753 m)   Wt 198 lb (89.8 kg)   SpO2 99%   BMI 29.24 kg/m   Physical Exam  Constitutional: He is oriented to person, place, and time. He appears well-developed and well-nourished.  HENT:  Head: Normocephalic and atraumatic.  Eyes: Conjunctivae and EOM are normal. Pupils are equal, round, and reactive to light. Right eye exhibits no discharge. Left eye exhibits no discharge. No scleral icterus.  Neck: Normal range of motion. Neck supple. No JVD present.  Cardiovascular: Normal rate, regular rhythm and normal heart sounds.   Pulmonary/Chest: Effort normal and breath sounds normal. No stridor. He has no wheezes. He has no rales. He exhibits no tenderness.  Abdominal: Bowel sounds are normal. He exhibits no distension and no mass. There is tenderness. There is no rebound and no guarding.  Moderate tenderness diffusely  Bowel sounds are normal  No rebound or guarding   Musculoskeletal: Normal range of motion. He exhibits no edema.  Lymphadenopathy:    He has no cervical adenopathy.  Neurological: He is alert and oriented to person, place, and time. No cranial nerve deficit. He exhibits normal muscle tone. Coordination normal.  Skin: Skin is warm and dry. No rash noted.  Psychiatric: He has a normal mood and affect. His behavior is normal. Judgment and thought content normal.  Nursing note and vitals reviewed.  ED Treatments / Results  Labs (all labs ordered are listed, but only abnormal results are displayed) Labs Reviewed  COMPREHENSIVE METABOLIC PANEL - Abnormal; Notable for the following:       Result Value   Glucose, Bld 123 (*)    Total Bilirubin 2.7 (*)    All other components within normal limits  CBC WITH DIFFERENTIAL/PLATELET - Abnormal; Notable for the following:    RBC 3.67 (*)    Hemoglobin 11.4 (*)    HCT 34.6 (*)    All other components within normal limits  URINALYSIS, ROUTINE W REFLEX MICROSCOPIC (NOT AT [facility]) - Abnormal; Notable for the following:     Ketones, ur 15 (*)    All other components within normal limits  I-STAT CG4 LACTIC ACID, ED - Abnormal; Notable for the following:    Lactic Acid, Venous 3.35 (*)    All other components within normal limits  LIPASE, BLOOD  I-STAT CG4 LACTIC ACID, ED    Procedures Procedures DIAGNOSTIC STUDIES:  Oxygen Saturation is 99% on RA, normal by my interpretation.    COORDINATION OF CARE:  4:04 AM Discussed treatment plan, which includes urinalysis with pt at bedside and pt agreed to plan.  Medications Ordered in ED Medications  0.9 %  sodium chloride infusion (0 mLs Intravenous Stopped 03/10/16 0549)    Followed by  0.9 %  sodium chloride infusion (0 mLs Intravenous Stopped 03/10/16 0708)  haloperidol lactate (HALDOL) injection 2 mg (2 mg Intravenous Given 03/10/16 0425)  metoCLOPramide (REGLAN) injection 10 mg (10 mg Intravenous Given 03/10/16 0423)  LORazepam (ATIVAN) tablet 1 mg (1 mg Oral Given 03/10/16 0424)  ketorolac (TORADOL) 30 MG/ML injection 30 mg (30 mg Intravenous Given 03/10/16 0423)  sodium chloride 0.9 % bolus 1,000 mL (1,000 mLs Intravenous New Bag/Given 03/10/16 0552)   Initial Impression / Assessment and Plan / ED Course  I have reviewed the triage vital signs and the nursing notes.  Pertinent labs that were available during my care of the patient were reviewed by me and considered in my medical decision making (see chart for details).  Clinical Course    Epigastric pain, nausea, vomiting. Old records reviewed, and patient has multiple ED visits and hospitalizations for intractable nausea and vomiting. On one admission, he was diagnosed with gastroparesis. He is also carried diagnosis of cyclic vomiting syndrome. I do not know that he is asked she had significant workup for this. He has had 6 ED visits of over the last 3 weeks, including yesterday. He has had several CT scans of his abdomen and pelvis, the most recent one was on July 7. Screening labs are obtained and he was  given IV fluids, morphine, metoclopramide. On this, he seemed to be resting comfortably. Initial lactic acid level was mildly elevated and this was felt to be due to dehydration and not sepsis. After aggressive IV hydration, lactic acid was repeated and was normal. He was given an oral fluid challenge, but vomited after drinking water. Of note, hemoglobin has dropped somewhat since yesterday. This will need to be watched closely. Elevated bilirubin is unchanged from baseline. Case is discussed with Jonah Blue PA-C of [facility] who agrees to admit the patient under observation status.  Final Clinical Impressions(s) / ED Diagnoses   Final diagnoses:  Intractable vomiting with nausea, vomiting of unspecified type  Epigastric pain  Elevated lactic acid level  Normochromic normocytic anemia  Total bilirubin, elevated    New Prescriptions New Prescriptions   No medications on file   I personally performed the services described in this documentation, which was scribed in my presence. The recorded information has been reviewed and is accurate.     Matthew Booze, MD 03/10/16 [id number]

## 2016-03-10 NOTE — H&P (Signed)
History and Physical    IBRAHEEM VORIS ZOX:[id number] DOB: [birth year] DOA: 03/10/2016  PCP: Jaclyn Shaggy, MD Consultants:  None Patient coming from: home (lives with parents)  Chief Complaint: n/v  HPI: CHRLES SELLEY is a 35 y.o. male with medical history significant of marijuana abuse presenting with n/v and epigastric abdominal pain.  Patient reports about 2 days ago with "feeling sick" - got up and showered and that seemed to make it worse.  Developed n/v, couldn't stop, unable to eat/drink.  Would come and go even during sleep.  Called 911 this AM.  Reports smoking weed about 5 days ago and believes this led to the flare up.  Has not smoked marijuana in the last 5 days.   ED Course: 6 ED visits over last 3 weeks, including yesterday.  Multiple CTs. Initial lactate 3.35, cleared with IVF.  Given IVF, morphine, Reglan.  Failed oral fluid challenge.  Review of Systems: As per HPI; otherwise 10 point review of systems reviewed and negative.   Ambulatory Status: ambulates independently  Past Medical History:  Diagnosis Date  . 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 Chronic abdominal pain   . GASTRITIS 12/09/2009   Qualifier: Diagnosis of  By: Daphine Deutscher FNP, Zena Amos    . Gastroparesis   . 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 Nausea and vomiting    chronic, recurrent  . Polysubstance abuse     Past Surgical History:  Procedure Laterality Date  . CHOLECYSTECTOMY  11/2009    Social History   Social History  . Marital status: Single    Spouse name: N/A  . Number of children: N/A  . Years of education: N/A   Occupational History  . Unemployed, helps people move    Social History Main Topics  . Smoking status: Former Smoker    Years: 8.00    Types: Cigars    Quit date: 12/15/2014  . Smokeless tobacco: Never Used  . Alcohol use No     Comment: 12/16/2014 " I no Longer  drink ,I quit about 2 years ago "  . Drug use:     Types: Marijuana     Comment: Marijuana, last 03/05/16  . Sexual activity: Yes   Other Topics Concern  . Not on file   Social History Narrative   Single, lives with parents.  Ambulatory.    No Known Allergies  Family History  Problem Relation Age of Onset  . Other Father   . Diabetes Father   . Hypertension Father     Prior to Admission medications   Medication Sig Start Date End Date Taking? Authorizing Provider  lisinopril (PRINIVIL,ZESTRIL) 5 MG tablet Take 1 tablet (5 mg total) by mouth daily. 12/22/15  Yes Jaclyn Shaggy, MD  metoCLOPramide (REGLAN) 10 MG tablet TAKE 1 TABLET BY MOUTH EVERY 6 HOURS AS NEEDED FOR NAUSEA OR VOMITING. 02/23/16  Yes Jaclyn Shaggy, MD  omeprazole (PRILOSEC) 20 MG capsule TAKE 1 CAPSULE BY MOUTH DAILY. 02/22/16  Yes Jaclyn Shaggy, MD  promethazine (PHENERGAN) 25 MG suppository Place 1 suppository (25 mg total) rectally every 6 (six) hours as needed for nausea or vomiting. 02/27/16  Yes Eyvonne Mechanic, PA-C    Physical Exam: Vitals:   03/10/16 0800 03/10/16 0900 03/10/16 1041 03/10/16 1301  BP: 119/69 133/80 (!) 135/91 130/66  Pulse: 80 61 79 65  Resp: 18  15  16  Temp:   98.2 F (36.8 C) 98.5 F (36.9 C)  TempSrc:   Oral Oral  SpO2: 97% 99% 98% 100%  Weight:      Height:         General: Appears calm and comfortable and is NAD Eyes:  PERRL, EOMI, normal lids, iris ENT:  grossly normal hearing, lips & tongue, mmm Neck:  no LAD, masses or thyromegaly Cardiovascular:  RRR, no m/r/g. No LE edema.  Respiratory:  CTA bilaterally, no w/r/r. Normal respiratory effort. Abdomen:  soft, ntnd, NABS Skin:  no rash or induration seen on limited exam Musculoskeletal:  grossly normal tone BUE/BLE, good ROM, no bony abnormality Psychiatric:  grossly normal mood and affect, speech fluent and appropriate, AOx3 Neurologic:  CN 2-12 grossly intact, moves all extremities in coordinated fashion, sensation  intact  Labs on Admission: I have personally reviewed following labs and imaging studies  CBC:  Recent Labs Lab 03/08/16 1943 03/10/16 0409  WBC 10.2 8.6  NEUTROABS  --  7.6  HGB 12.2* 11.4*  HCT 36.1* 34.6*  MCV 93.3 94.3  PLT 276 249   Basic Metabolic Panel:  Recent Labs Lab 03/08/16 1943 03/10/16 0409  NA 143 139  K 3.8 3.5  CL 108 103  CO2 26 24  GLUCOSE 130* 123*  BUN 9 11  CREATININE 1.04 1.11  CALCIUM 10.2 9.4   GFR: Estimated Creatinine Clearance: 101.9 mL/min (by C-G formula based on SCr of 1.11 mg/dL). Liver Function Tests:  Recent Labs Lab 03/08/16 1943 03/10/16 0409  AST 31 38  ALT 31 36  ALKPHOS 62 54  BILITOT 2.2* 2.7*  PROT 8.0 6.9  ALBUMIN 4.7 4.2    Recent Labs Lab 03/08/16 1943 03/10/16 0409  LIPASE 19 16   No results for input(s): AMMONIA in the last 168 hours. Coagulation Profile: No results for input(s): INR, PROTIME in the last 168 hours. Cardiac Enzymes: No results for input(s): CKTOTAL, CKMB, CKMBINDEX, TROPONINI in the last 168 hours. BNP (last 3 results) No results for input(s): PROBNP in the last 8760 hours. HbA1C: No results for input(s): HGBA1C in the last 72 hours. CBG: No results for input(s): GLUCAP in the last 168 hours. Lipid Profile: No results for input(s): CHOL, HDL, LDLCALC, TRIG, CHOLHDL, LDLDIRECT in the last 72 hours. Thyroid Function Tests: No results for input(s): TSH, T4TOTAL, FREET4, T3FREE, THYROIDAB in the last 72 hours. Anemia Panel: No results for input(s): VITAMINB12, FOLATE, FERRITIN, TIBC, IRON, RETICCTPCT in the last 72 hours. Urine analysis:    Component Value Date/Time   COLORURINE YELLOW 03/10/2016 0556   APPEARANCEUR CLEAR 03/10/2016 0556   LABSPEC 1.025 03/10/2016 0556   PHURINE 6.5 03/10/2016 0556   GLUCOSEU NEGATIVE 03/10/2016 0556   HGBUR NEGATIVE 03/10/2016 0556   BILIRUBINUR NEGATIVE 03/10/2016 0556   KETONESUR 15 (A) 03/10/2016 0556   PROTEINUR NEGATIVE 03/10/2016 0556    UROBILINOGEN 1.0 04/27/2015 1206   NITRITE NEGATIVE 03/10/2016 0556   LEUKOCYTESUR NEGATIVE 03/10/2016 0556    Creatinine Clearance: Estimated Creatinine Clearance: 101.9 mL/min (by C-G formula based on SCr of 1.11 mg/dL).  Sepsis Labs: @LABRCNTIP (procalcitonin:4,lacticidven:4) )No results found for this or any previous visit (from the past 240 hour(s)).   Radiological Exams on Admission: No results found.  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  Will recheck in AM, but already trending down.  Anemia -Trending down from prior ER visits -Will recheck in AM -If ongoing decrease, suggest hemoccult testing.   DVT prophylaxis:  Lovenox  Code Status:  Full - confirmed with patient Family Communication: Not present   Disposition Plan:  Home once clinically improved Consults called: None  Admission status: Observation - Med-Surg    Jonah Blue MD [facility]  If 7PM-7AM, please contact night-coverage [email or web address] Password [facility]  03/10/2016, 6:02 PM

## 2016-03-10 NOTE — ED Triage Notes (Signed)
Patient is complaining of nausea and vomiting. Patient was seen here yesterday for the same symptoms.

## 2016-03-10 NOTE — ED Notes (Signed)
Writer notified EDP of abnormal I-stats lactic results

## 2016-03-10 NOTE — ED Notes (Signed)
Requested patient to urinate. 

## 2016-03-11 DIAGNOSIS — G43A1 Cyclical vomiting, intractable: Secondary | ICD-10-CM

## 2016-03-11 DIAGNOSIS — R1013 Epigastric pain: Secondary | ICD-10-CM

## 2016-03-11 LAB — COMPREHENSIVE METABOLIC PANEL
ALT: 25 U/L (ref 17–63)
AST: 21 U/L (ref 15–41)
Albumin: 3.6 g/dL (ref 3.5–5.0)
Alkaline Phosphatase: 49 U/L (ref 38–126)
Anion gap: 9 (ref 5–15)
BUN: 8 mg/dL (ref 6–20)
CO2: 25 mmol/L (ref 22–32)
Calcium: 8.7 mg/dL — ABNORMAL LOW (ref 8.9–10.3)
Chloride: 105 mmol/L (ref 101–111)
Creatinine, Ser: 0.97 mg/dL (ref 0.61–1.24)
GFR calc Af Amer: >60 mL/min (ref >60)
GFR calc non Af Amer: >60 mL/min (ref >60)
Glucose, Bld: 83 mg/dL (ref 65–99)
Potassium: 3.2 mmol/L — ABNORMAL LOW (ref 3.5–5.1)
Sodium: 139 mmol/L (ref 135–145)
Total Bilirubin: 3.7 mg/dL — ABNORMAL HIGH (ref 0.3–1.2)
Total Protein: 6.1 g/dL — ABNORMAL LOW (ref 6.5–8.1)

## 2016-03-11 LAB — CBC
HCT: 32.1 % — ABNORMAL LOW (ref 39.0–52.0)
Hemoglobin: 10.9 g/dL — ABNORMAL LOW (ref 13.0–17.0)
MCH: 31.7 pg (ref 26.0–34.0)
MCHC: 34 g/dL (ref 30.0–36.0)
MCV: 93.3 fL (ref 78.0–100.0)
Platelets: 226 10*3/uL (ref 150–400)
RBC: 3.44 MIL/uL — ABNORMAL LOW (ref 4.22–5.81)
RDW: 12.7 % (ref 11.5–15.5)
WBC: 7.1 10*3/uL (ref 4.0–10.5)

## 2016-03-11 MED ORDER — METOCLOPRAMIDE HCL 10 MG PO TABS: 10.0000 mg | ORAL_TABLET | Freq: Four times a day (QID) | ORAL | Status: DC | PRN

## 2016-03-11 NOTE — Discharge Summary (Signed)
Physician Discharge Summary  Matthew Scott [id number] DOB: June 10, 1981 DOA: 03/10/2016  PCP: Jaclyn Shaggy, MD  Admit date: 03/10/2016 Discharge date: 03/11/2016   Recommendations for Outpatient Follow-Up:   1. Melatonin for sleep 2. 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  Discharge Condition: Improved.  Diet recommendation:  Regular.  Wound care: None.   History of Present Illness:   Matthew Scott is a 35 y.o. male with medical history significant of marijuana abuse presenting with n/v and epigastric abdominal pain.  Patient reports about 2 days ago with "feeling sick" - got up and showered and that seemed to make it worse.  Developed n/v, couldn't stop, unable to eat/drink.  Would come and go even during sleep.  Called 911 this AM.  Reports smoking weed about 5 days ago and believes this led to the flare up.  Has not smoked marijuana in the last 5 days.    Hospital Course by Problem:   Intractable vomiting -resolved-- due to marijuana abuse  Anemia -Trending down from prior ER visits -outpatient follow up    Medical Consultants:    None.   Discharge Exam:   Vitals:   03/10/16 2133 03/11/16 0518  BP: (!) 147/94 138/70  Pulse: 70 (!) 58  Resp: 16 16  Temp: 98.9 F (37.2 C) 98.4 F (36.9 C)   Vitals:   03/10/16 1041 03/10/16 1301 03/10/16 2133 03/11/16 0518  BP: (!) 135/91 130/66 (!) 147/94 138/70  Pulse: 79 65 70 (!) 58  Resp: 15 16 16 16   Temp: 98.2 F (36.8 C) 98.5 F (36.9 C) 98.9 F (37.2 C) 98.4 F (36.9 C)  TempSrc: Oral Oral Oral Oral  SpO2: 98% 100% 100% 100%  Weight:      Height:        Gen:  NAD- wanting to go home    The results of significant diagnostics from this hospitalization (including imaging, microbiology, ancillary and  laboratory) are listed below for reference.     Procedures and Diagnostic Studies:   No results found.   Labs:   Basic Metabolic Panel:  Recent Labs Lab 03/08/16 1943 03/10/16 0409 03/11/16 0423  NA 143 139 139  K 3.8 3.5 3.2*  CL 108 103 105  CO2 26 24 25   GLUCOSE 130* 123* 83  BUN 9 11 8   CREATININE 1.04 1.11 0.97  CALCIUM 10.2 9.4 8.7*   GFR Estimated Creatinine Clearance: 116.6 mL/min (by C-G formula based on SCr of 0.97 mg/dL). Liver Function Tests:  Recent Labs Lab 03/08/16 1943 03/10/16 0409 03/11/16 0423  AST 31 38 21  ALT 31 36 25  ALKPHOS 62 54 49  BILITOT 2.2* 2.7* 3.7*  PROT 8.0 6.9 6.1*  ALBUMIN 4.7 4.2 3.6    Recent Labs Lab 03/08/16 1943 03/10/16 0409  LIPASE 19 16   No results for input(s): AMMONIA in the last 168 hours. Coagulation profile No results for input(s): INR, PROTIME in the last 168 hours.  CBC:  Recent Labs Lab 03/08/16 1943 03/10/16 0409 03/11/16 0423  WBC 10.2 8.6 7.1  NEUTROABS  --  7.6  --   HGB 12.2* 11.4* 10.9*  HCT 36.1* 34.6* 32.1*  MCV 93.3 94.3 93.3  PLT 276 249 226   Cardiac Enzymes: No results for input(s): CKTOTAL, CKMB, CKMBINDEX, TROPONINI in the  last 168 hours. BNP: Invalid input(s): POCBNP CBG: No results for input(s): GLUCAP in the last 168 hours. D-Dimer No results for input(s): DDIMER in the last 72 hours. Hgb A1c No results for input(s): HGBA1C in the last 72 hours. Lipid Profile No results for input(s): CHOL, HDL, LDLCALC, TRIG, CHOLHDL, LDLDIRECT in the last 72 hours. Thyroid function studies No results for input(s): TSH, T4TOTAL, T3FREE, THYROIDAB in the last 72 hours.  Invalid input(s): FREET3 Anemia work up No results for input(s): VITAMINB12, FOLATE, FERRITIN, TIBC, IRON, RETICCTPCT in the last 72 hours. Microbiology No results found for this or any previous visit (from the past 240 hour(s)).   Discharge Instructions:   Discharge Instructions    Diet - low sodium heart  healthy    Complete by:  As directed   Discharge instructions    Complete by:  As directed   DO NOT SMOKE MARIJUANA-- for sleep can use 2.5mg -3mg  of melatonin (can buy over the counter).  Take as sun goes down.  Need good sleep hygeine as well, no watching TV, cell phone, tablets in bed   Increase activity slowly    Complete by:  As directed       Medication List    TAKE these medications   lisinopril 5 MG tablet Commonly known as:  PRINIVIL,ZESTRIL Take 1 tablet (5 mg total) by mouth daily.   metoCLOPramide 10 MG tablet Commonly known as:  REGLAN TAKE 1 TABLET BY MOUTH EVERY 6 HOURS AS NEEDED FOR NAUSEA OR VOMITING.   omeprazole 20 MG capsule Commonly known as:  PRILOSEC TAKE 1 CAPSULE BY MOUTH DAILY.   promethazine 25 MG suppository Commonly known as:  PHENERGAN Place 1 suppository (25 mg total) rectally every 6 (six) hours as needed for nausea or vomiting.      Follow-up Information    Jaclyn Shaggy, MD Follow up in 1 month(s).   Specialty:  Family Medicine Contact information: [address] [place] [address] [phone]            Time coordinating discharge: 35 min  Signed:  Alvan Culpepper Juanetta Gosling   [facility] 03/11/2016, 10:32 AM

## 2016-03-11 NOTE — Progress Notes (Signed)
Discharge instructions reviewed with patient, questions answered, verbalized understanding.  Patient awaiting arrival of ride.

## 2016-03-21 ENCOUNTER — Other Ambulatory Visit: Payer: Self-pay | Admitting: Family Medicine

## 2016-03-21 DIAGNOSIS — R1115 Cyclical vomiting syndrome unrelated to migraine: Secondary | ICD-10-CM

## 2016-03-21 MED FILL — PROMETHAZINE 25 MG SUPP: 25 | 3 days supply | Qty: 12 | Fill #0

## 2016-03-28 MED FILL — ?OMEPRAZOLE DR 20 MG CAPSUL: 20 | 30 days supply | Qty: 30 | Fill #0

## 2016-03-28 MED FILL — METOCLOPRAMIDE 10 MG TABLET: 10 | 7 days supply | Qty: 30 | Fill #0

## 2016-05-05 ENCOUNTER — Other Ambulatory Visit: Payer: Self-pay | Admitting: Family Medicine

## 2016-05-05 DIAGNOSIS — R1115 Cyclical vomiting syndrome unrelated to migraine: Secondary | ICD-10-CM

## 2016-05-06 ENCOUNTER — Telehealth: Payer: Self-pay | Admitting: Family Medicine

## 2016-05-06 MED FILL — METOCLOPRAMIDE 10 MG TABLET: 10 | 7 days supply | Qty: 30 | Fill #0

## 2016-05-06 NOTE — Telephone Encounter (Signed)
Medication Refill: promethazine (PHENERGAN) 25 MG suppository

## 2016-05-08 ENCOUNTER — Encounter (HOSPITAL_COMMUNITY): Payer: Self-pay

## 2016-05-08 ENCOUNTER — Emergency Department (HOSPITAL_COMMUNITY)
Admission: EM | Admit: 2016-05-08 | Discharge: 2016-05-08 | Disposition: A | Discharge status: Home / Self Care | Payer: Self-pay | Attending: Emergency Medicine | Admitting: Emergency Medicine

## 2016-05-08 DIAGNOSIS — R1110 Vomiting, unspecified: Secondary | ICD-10-CM

## 2016-05-08 DIAGNOSIS — Z87891 Personal history of nicotine dependence: Secondary | ICD-10-CM | POA: Insufficient documentation

## 2016-05-08 DIAGNOSIS — R112 Nausea with vomiting, unspecified: Secondary | ICD-10-CM | POA: Insufficient documentation

## 2016-05-08 DIAGNOSIS — Z79899 Other long term (current) drug therapy: Secondary | ICD-10-CM | POA: Insufficient documentation

## 2016-05-08 LAB — COMPREHENSIVE METABOLIC PANEL
ALT: 17 U/L (ref 17–63)
AST: 24 U/L (ref 15–41)
Albumin: 4.5 g/dL (ref 3.5–5.0)
Alkaline Phosphatase: 65 U/L (ref 38–126)
Anion gap: 8 (ref 5–15)
BUN: 13 mg/dL (ref 6–20)
CO2: 23 mmol/L (ref 22–32)
Calcium: 9.2 mg/dL (ref 8.9–10.3)
Chloride: 108 mmol/L (ref 101–111)
Creatinine, Ser: 1.1 mg/dL (ref 0.61–1.24)
GFR calc Af Amer: >60 mL/min (ref >60)
GFR calc non Af Amer: >60 mL/min (ref >60)
Glucose, Bld: 187 mg/dL — ABNORMAL HIGH (ref 65–99)
Potassium: 3.1 mmol/L — ABNORMAL LOW (ref 3.5–5.1)
Sodium: 139 mmol/L (ref 135–145)
Total Bilirubin: 1 mg/dL (ref 0.3–1.2)
Total Protein: 7.8 g/dL (ref 6.5–8.1)

## 2016-05-08 LAB — URINALYSIS, ROUTINE W REFLEX MICROSCOPIC
Bilirubin Urine: NEGATIVE
Glucose, UA: 100 mg/dL — AB
Ketones, ur: 15 mg/dL — AB
Leukocytes, UA: NEGATIVE
Nitrite: NEGATIVE
Specific Gravity, Urine: >1.03 — ABNORMAL HIGH (ref 1.005–1.030)
pH: 6 (ref 5.0–8.0)

## 2016-05-08 LAB — CBC
HCT: 39.7 % (ref 39.0–52.0)
Hemoglobin: 13.3 g/dL (ref 13.0–17.0)
MCH: 31.5 pg (ref 26.0–34.0)
MCHC: 33.5 g/dL (ref 30.0–36.0)
MCV: 94.1 fL (ref 78.0–100.0)
Platelets: 186 10*3/uL (ref 150–400)
RBC: 4.22 MIL/uL (ref 4.22–5.81)
RDW: 11.8 % (ref 11.5–15.5)
WBC: 11.6 10*3/uL — ABNORMAL HIGH (ref 4.0–10.5)

## 2016-05-08 LAB — LIPASE, BLOOD: Lipase: 14 U/L (ref 11–51)

## 2016-05-08 LAB — URINE MICROSCOPIC-ADD ON: WBC, UA: NONE SEEN WBC/hpf (ref 0–5)

## 2016-05-08 MED ORDER — PROMETHAZINE HCL 25 MG/ML IJ SOLN
25.0000 mg | Freq: Once | INTRAMUSCULAR | Status: AC
  Administered 2016-05-08: 25 mg via INTRAVENOUS
  Filled 2016-05-08: qty 1

## 2016-05-08 MED ORDER — SODIUM CHLORIDE 0.9 % IV BOLUS (SEPSIS)
1000.0000 mL | Freq: Once | INTRAVENOUS | Status: AC
  Administered 2016-05-08: 1000 mL via INTRAVENOUS

## 2016-05-08 MED ORDER — PROMETHAZINE HCL 25 MG PO TABS: 25.0000 mg | ORAL_TABLET | Freq: Four times a day (QID) | ORAL | 0 refills | Status: DC | PRN

## 2016-05-08 MED ORDER — MORPHINE SULFATE (PF) 4 MG/ML IV SOLN
4.0000 mg | Freq: Once | INTRAVENOUS | Status: AC
Start: 2016-05-08 — End: 2016-05-08
  Administered 2016-05-08: 4 mg via INTRAVENOUS
  Filled 2016-05-08: qty 1

## 2016-05-08 MED ORDER — METOCLOPRAMIDE HCL 5 MG/ML IJ SOLN
10.0000 mg | Freq: Once | INTRAMUSCULAR | Status: AC
  Administered 2016-05-08: 10 mg via INTRAVENOUS
  Filled 2016-05-08: qty 2

## 2016-05-08 MED ORDER — METOCLOPRAMIDE HCL 10 MG PO TABS: 10.0000 mg | ORAL_TABLET | Freq: Four times a day (QID) | ORAL | 0 refills | Status: DC

## 2016-05-08 MED ORDER — SODIUM CHLORIDE 0.9 % IV SOLN
1000.0000 mL | Freq: Once | INTRAVENOUS | Status: AC
  Administered 2016-05-08: 1000 mL via INTRAVENOUS

## 2016-05-08 MED ORDER — ONDANSETRON HCL 4 MG/2ML IJ SOLN
INTRAMUSCULAR | Status: AC
  Filled 2016-05-08: qty 2

## 2016-05-08 MED ORDER — ONDANSETRON HCL 4 MG/2ML IJ SOLN
4.0000 mg | Freq: Once | INTRAMUSCULAR | Status: AC
Start: 2016-05-08 — End: 2016-05-08
  Administered 2016-05-08: 4 mg via INTRAVENOUS

## 2016-05-08 NOTE — ED Notes (Signed)
MD at bedside. 

## 2016-05-08 NOTE — ED Triage Notes (Signed)
Pt brought in by EMS with complaints of cramping RUQ pain that started 0730. Pt states has gallbladder removed. Had this pain 3 weeks ago. Vomiting and nausea. Received Zofran 4 mg by EMS

## 2016-05-08 NOTE — Discharge Instructions (Signed)
Medication for nausea and to help keep your fluids down. Avoid rich greasy foods for the next 24 hours. Recommend primary care follow-up. Avoid marijuana

## 2016-05-08 NOTE — ED Notes (Signed)
Pt had BM upon arrival . Pt states it was normal. Vomiting at this time

## 2016-05-08 NOTE — ED Provider Notes (Signed)
[facility]-EMERGENCY DEPT Provider Note   CSN: [id number] Arrival date & time: 05/08/16  0940   By signing my name below, I, Nelwyn Salisbury, attest that this documentation has been prepared under the direction and in the presence of Donnetta Hutching, MD . Electronically Signed: Nelwyn Salisbury, Scribe. 05/08/2016. 10:18 AM.  History   Chief Complaint Chief Complaint  Patient presents with  . Abdominal Pain   The history is provided by the patient. No language interpreter was used.    HPI Comments:  Matthew Scott is a 35 y.o. male with PMHx of Gastritis who presents to the Emergency Department complaining of sudden-onset unchanged vomiting beginning this morning. Pt reports that this is a recurring issue and has been seen several times in the past 6 months for similar symptoms. He notes that he typically receives Zofran and is told to sleep it off. No modifying factors indicated. He endorses associated abdominal discomfort and nausea.    PCP: Venetia Night [phone] Past Medical History:  Diagnosis Date  . 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 Chronic abdominal pain   . GASTRITIS 12/09/2009   Qualifier: Diagnosis of  By: Daphine Deutscher FNP, Zena Amos    . Gastroparesis   . 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 Nausea and vomiting    chronic, recurrent  . Polysubstance abuse     Patient Active Problem List   Diagnosis Date Noted  . Uncontrollable vomiting 03/10/2016  . Anemia 03/10/2016  . Metatarsal fracture 12/22/2015  . Essential hypertension 12/22/2015  . Night sweats 01/21/2015  . GERD (gastroesophageal reflux disease) 01/21/2015  . Gastritis 12/19/2014  . Hyperbilirubinemia 12/19/2014  . Elevated bilirubin   . Nausea & vomiting 12/14/2014  . Severe tetrahydrocannabinol (THC) dependence (HCC) 12/14/2014  . Alcohol abuse 12/14/2014  . Cyclic vomiting syndrome 12/06/2014  . 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 Gastroparesis 05/11/2012  . Abdominal pain 05/06/2012  . Polysubstance abuse 05/06/2012  . CHOLECYSTECTOMY, HX OF 11/25/2009    Past Surgical History:  Procedure Laterality Date  . CHOLECYSTECTOMY  11/2009    Home Medications    Prior to Admission medications   Medication Sig Start Date End Date Taking? Authorizing Provider  lisinopril (PRINIVIL,ZESTRIL) 5 MG tablet Take 1 tablet (5 mg total) by mouth daily. 12/22/15  Yes Jaclyn Shaggy, MD  omeprazole (PRILOSEC) 20 MG capsule TAKE 1 CAPSULE BY MOUTH DAILY. 02/22/16  Yes Jaclyn Shaggy, MD  metoCLOPramide (REGLAN) 10 MG tablet Take 1 tablet (10 mg total) by mouth every 6 (six) hours. 05/08/16   Donnetta Hutching, MD  promethazine (PHENERGAN) 25 MG tablet Take 1 tablet (25 mg total) by mouth every 6 (six) hours as needed. 05/08/16   Donnetta Hutching, MD    Family History Family History  Problem Relation Age of Onset  . Other Father   . Diabetes Father   . Hypertension Father     Social History Social History  Substance Use Topics  . Smoking status: Former Smoker    Years: 8.00    Types: Cigars    Quit date: 12/15/2014  . Smokeless tobacco: Never Used  . Alcohol use No     Comment: 12/16/2014 " I no Longer drink ,I quit about 2 years ago "     Allergies   Review of patient's allergies indicates no known allergies.   Review of Systems Review of Systems  Gastrointestinal: Positive for abdominal pain, nausea and vomiting.  All other systems reviewed and are negative.    Physical Exam Updated Vital Signs BP 135/72   Pulse 60   Temp 97.7 F (36.5 C) (Oral)   Resp 17   Ht 5\' 9"  (1.753 m)   Wt 198 lb (89.8 kg)   SpO2 100%   BMI 29.24 kg/m   Physical Exam  Constitutional: He is oriented to person, place, and time. He appears well-developed and well-nourished.  HENT:  Head: Normocephalic and atraumatic.  Eyes: Conjunctivae are normal.  Neck: Neck supple.    Cardiovascular: Normal rate and regular rhythm.   Pulmonary/Chest: Effort normal and breath sounds normal.  Abdominal: Soft. Bowel sounds are normal.  Musculoskeletal: Normal range of motion.  Neurological: He is alert and oriented to person, place, and time.  Skin: Skin is warm and dry.  Psychiatric: He has a normal mood and affect. His behavior is normal.  Nursing note and vitals reviewed.    ED Treatments / Results  DIAGNOSTIC STUDIES:  Oxygen Saturation is 98% on RA, normal by my interpretation.    Labs (all labs ordered are listed, but only abnormal results are displayed) Labs Reviewed  COMPREHENSIVE METABOLIC PANEL - Abnormal; Notable for the following:       Result Value   Potassium 3.1 (*)    Glucose, Bld 187 (*)    All other components within normal limits  CBC - Abnormal; Notable for the following:    WBC 11.6 (*)    All other components within normal limits  URINALYSIS, ROUTINE W REFLEX MICROSCOPIC (NOT AT [facility]) - Abnormal; Notable for the following:    Specific Gravity, Urine >1.030 (*)    Glucose, UA 100 (*)    Hgb urine dipstick TRACE (*)    Ketones, ur 15 (*)    Protein, ur TRACE (*)    All other components within normal limits  URINE MICROSCOPIC-ADD ON - Abnormal; Notable for the following:    Squamous Epithelial / LPF 0-5 (*)    Bacteria, UA RARE (*)    All other components within normal limits  LIPASE, BLOOD    EKG  EKG Interpretation None       Radiology No results found.  Procedures Procedures (including critical care time)  Medications Ordered in ED Medications  ondansetron (ZOFRAN) injection 4 mg (4 mg Intravenous Given 05/08/16 1001)  0.9 %  sodium chloride infusion (0 mLs Intravenous Stopped 05/08/16 1244)  metoCLOPramide (REGLAN) injection 10 mg (10 mg Intravenous Given 05/08/16 1055)  morphine 4 MG/ML injection 4 mg (4 mg Intravenous Given 05/08/16 1055)  sodium chloride 0.9 % bolus 1,000 mL (0 mLs Intravenous Stopped 05/08/16 1059)   sodium chloride 0.9 % bolus 1,000 mL (0 mLs Intravenous Stopped 05/08/16 1541)  promethazine (PHENERGAN) injection 25 mg (25 mg Intravenous Given 05/08/16 1420)     Initial Impression / Assessment and Plan / ED Course  I have reviewed the triage vital signs and the nursing notes.  Pertinent labs & imaging results that were available during my care of the patient were reviewed by me and considered in my medical decision making (see chart for details).  Clinical Course   COORDINATION OF CARE:  10:28 AM Discussed treatment plan with pt at bedside which included IV fluids and Zofran, and and pt agreed to plan.  Patient feels much better after IV hydration, IV Reglan, IV Zofran. Discussed findings with the patient and his significant other. Discharge medications Phenergan 25  mg and Reglan 10 mg  Final Clinical Impressions(s) / ED Diagnoses   Final diagnoses:  Intractable vomiting with nausea, vomiting of unspecified type    New Prescriptions New Prescriptions   METOCLOPRAMIDE (REGLAN) 10 MG TABLET    Take 1 tablet (10 mg total) by mouth every 6 (six) hours.   PROMETHAZINE (PHENERGAN) 25 MG TABLET    Take 1 tablet (25 mg total) by mouth every 6 (six) hours as needed.  I personally performed the services described in this documentation, which was scribed in my presence. The recorded information has been reviewed and is accurate.      Donnetta HutchingBrian Dinisha Cai, MD 05/08/16 [phone]

## 2016-05-08 NOTE — ED Notes (Signed)
EDP at bedside  

## 2016-05-09 ENCOUNTER — Emergency Department (HOSPITAL_COMMUNITY)
Admission: EM | Admit: 2016-05-09 | Discharge: 2016-05-09 | Disposition: A | Discharge status: Home / Self Care | Payer: Self-pay | Attending: Dermatology | Admitting: Dermatology

## 2016-05-09 ENCOUNTER — Encounter (HOSPITAL_COMMUNITY): Payer: Self-pay | Admitting: Emergency Medicine

## 2016-05-09 ENCOUNTER — Emergency Department (HOSPITAL_COMMUNITY)
Admission: EM | Admit: 2016-05-09 | Discharge: 2016-05-09 | Disposition: A | Discharge status: Home / Self Care | Payer: Self-pay | Attending: Emergency Medicine | Admitting: Emergency Medicine

## 2016-05-09 DIAGNOSIS — Z87891 Personal history of nicotine dependence: Secondary | ICD-10-CM | POA: Insufficient documentation

## 2016-05-09 DIAGNOSIS — R509 Fever, unspecified: Secondary | ICD-10-CM | POA: Insufficient documentation

## 2016-05-09 DIAGNOSIS — F1290 Cannabis use, unspecified, uncomplicated: Secondary | ICD-10-CM | POA: Insufficient documentation

## 2016-05-09 DIAGNOSIS — Z79899 Other long term (current) drug therapy: Secondary | ICD-10-CM | POA: Insufficient documentation

## 2016-05-09 DIAGNOSIS — R109 Unspecified abdominal pain: Secondary | ICD-10-CM | POA: Insufficient documentation

## 2016-05-09 DIAGNOSIS — R112 Nausea with vomiting, unspecified: Secondary | ICD-10-CM | POA: Insufficient documentation

## 2016-05-09 DIAGNOSIS — R1084 Generalized abdominal pain: Secondary | ICD-10-CM | POA: Insufficient documentation

## 2016-05-09 DIAGNOSIS — Z5321 Procedure and treatment not carried out due to patient leaving prior to being seen by health care provider: Secondary | ICD-10-CM | POA: Insufficient documentation

## 2016-05-09 DIAGNOSIS — I10 Essential (primary) hypertension: Secondary | ICD-10-CM | POA: Insufficient documentation

## 2016-05-09 LAB — CBC
HCT: 39.9 % (ref 39.0–52.0)
Hemoglobin: 14.2 g/dL (ref 13.0–17.0)
MCH: 31.8 pg (ref 26.0–34.0)
MCHC: 35.6 g/dL (ref 30.0–36.0)
MCV: 89.5 fL (ref 78.0–100.0)
Platelets: 275 10*3/uL (ref 150–400)
RBC: 4.46 MIL/uL (ref 4.22–5.81)
RDW: 12.5 % (ref 11.5–15.5)
WBC: 15.9 10*3/uL — ABNORMAL HIGH (ref 4.0–10.5)

## 2016-05-09 LAB — COMPREHENSIVE METABOLIC PANEL
ALT: 23 U/L (ref 17–63)
AST: 35 U/L (ref 15–41)
Albumin: 5.2 g/dL — ABNORMAL HIGH (ref 3.5–5.0)
Alkaline Phosphatase: 65 U/L (ref 38–126)
Anion gap: 18 — ABNORMAL HIGH (ref 5–15)
BUN: 15 mg/dL (ref 6–20)
CO2: 20 mmol/L — ABNORMAL LOW (ref 22–32)
Calcium: 10.6 mg/dL — ABNORMAL HIGH (ref 8.9–10.3)
Chloride: 97 mmol/L — ABNORMAL LOW (ref 101–111)
Creatinine, Ser: 1.23 mg/dL (ref 0.61–1.24)
GFR calc Af Amer: >60 mL/min (ref >60)
GFR calc non Af Amer: >60 mL/min (ref >60)
Glucose, Bld: 118 mg/dL — ABNORMAL HIGH (ref 65–99)
Potassium: 3.1 mmol/L — ABNORMAL LOW (ref 3.5–5.1)
Sodium: 135 mmol/L (ref 135–145)
Total Bilirubin: 3.4 mg/dL — ABNORMAL HIGH (ref 0.3–1.2)
Total Protein: 9.2 g/dL — ABNORMAL HIGH (ref 6.5–8.1)

## 2016-05-09 LAB — LIPASE, BLOOD: Lipase: 19 U/L (ref 11–51)

## 2016-05-09 MED FILL — LISINOPRIL 5 MG TABLET: 5 | 90 days supply | Qty: 90 | Fill #1

## 2016-05-09 MED FILL — ?OMEPRAZOLE DR 20 MG CAPSUL: 20 | 30 days supply | Qty: 30 | Fill #1

## 2016-05-09 NOTE — Discharge Instructions (Signed)

## 2016-05-09 NOTE — ED Triage Notes (Signed)
Pt brought in by EMS with c/o abd pain, nausea, vomiting, and fever  Pt is diaphoretic in triage and shaking  Pt was seen here earlier for same

## 2016-05-09 NOTE — Telephone Encounter (Signed)
Patient received refill of this in ED over the weekend.

## 2016-05-09 NOTE — ED Notes (Signed)
Patient d/c'd self care.  F/U reviewed with patient.  Patient verbalized understanding. 

## 2016-05-09 NOTE — ED Provider Notes (Signed)
[facility]-EMERGENCY DEPT Provider Note   CSN: [id number] Arrival date & time: 05/09/16  0014  By signing my name below, I, Sonum Patel, attest that this documentation has been prepared under the direction and in the presence of Zadie [name] Jaydn Moscato, MD. Electronically Signed: Sonum Patel, Neurosurgeoncribe. 05/09/16. 1:05 AM.  History   Chief Complaint Chief Complaint  Patient presents with  . Abdominal Pain  . Emesis    The history is provided by the patient. No language interpreter was used.  Abdominal Pain   This is a recurrent problem. The current episode started more than 2 days ago. The problem occurs constantly. The problem has not changed since onset.Associated with: smoking marijuana. The pain is located in the generalized abdominal region. Associated symptoms include nausea and vomiting. Pertinent negatives include fever, diarrhea and dysuria.   HPI Comments: Matthew AusWillie E Scott is a 35 y.o. male with past medical history of gastritis, gastroparesis, biliary dyskinesia, h.pylori infection who presents to the Emergency Department complaining of 3 days of unchanged generalized abdominal pain with associated vomiting and chest pain. He states smoking marijuana precipitated these symptoms. He denies diarrhea, fever, dysuria, SOB, back pain, hematemesis, groin pain, testicular pain. He denies alcohol use.   Past Medical History:  Diagnosis Date  . 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 Chronic abdominal pain   . GASTRITIS 12/09/2009   Qualifier: Diagnosis of  By: Daphine DeutscherMartin FNP, Zena AmosNykedtra    . Gastroparesis   . HELICOBACTER PYLORI INFECTION 12/14/2009   Qualifier: Diagnosis of  By: Levon Hedgerraddock, Brenda    . Nausea and vomiting    chronic, recurrent  . Polysubstance abuse     Patient Active Problem List   Diagnosis Date Noted  . Uncontrollable vomiting 03/10/2016  . Anemia 03/10/2016  . Metatarsal fracture 12/22/2015  . Essential hypertension 12/22/2015  .  Night sweats 01/21/2015  . GERD (gastroesophageal reflux disease) 01/21/2015  . Gastritis 12/19/2014  . Hyperbilirubinemia 12/19/2014  . Elevated bilirubin   . Nausea & vomiting 12/14/2014  . Severe tetrahydrocannabinol (THC) dependence (HCC) 12/14/2014  . Alcohol abuse 12/14/2014  . Cyclic vomiting syndrome 12/06/2014  . 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 Gastroparesis 05/11/2012  . Abdominal pain 05/06/2012  . Polysubstance abuse 05/06/2012  . CHOLECYSTECTOMY, HX OF 11/25/2009    Past Surgical History:  Procedure Laterality Date  . CHOLECYSTECTOMY  11/2009       Home Medications    Prior to Admission medications   Medication Sig Start Date End Date Taking? Authorizing Provider  lisinopril (PRINIVIL,ZESTRIL) 5 MG tablet Take 1 tablet (5 mg total) by mouth daily. 12/22/15   Jaclyn ShaggyEnobong Amao, MD  metoCLOPramide (REGLAN) 10 MG tablet Take 1 tablet (10 mg total) by mouth every 6 (six) hours. 05/08/16   Donnetta HutchingBrian Cook, MD  omeprazole (PRILOSEC) 20 MG capsule TAKE 1 CAPSULE BY MOUTH DAILY. 02/22/16   Jaclyn ShaggyEnobong Amao, MD  promethazine (PHENERGAN) 25 MG tablet Take 1 tablet (25 mg total) by mouth every 6 (six) hours as needed. 05/08/16   Donnetta HutchingBrian Cook, MD    Family History Family History  Problem Relation Age of Onset  . Other Father   . Diabetes Father   . Hypertension Father     Social History Social History  Substance Use Topics  . Smoking status: Former Smoker    Years: 8.00    Types: Cigars    Quit date: 12/15/2014  . Smokeless tobacco: Never Used  .  Alcohol use No     Comment: 12/16/2014 " I no Longer drink ,I quit about 2 years ago "     Allergies   Review of patient's allergies indicates no known allergies.   Review of Systems Review of Systems  Constitutional: Negative for fever.  Respiratory: Negative for shortness of breath.   Gastrointestinal: Positive for abdominal pain, nausea and vomiting. Negative for diarrhea.    Genitourinary: Negative for dysuria and testicular pain.  Musculoskeletal: Negative for back pain.     Physical Exam Updated Vital Signs BP 167/78 (BP Location: Right Arm)   Pulse 68   Temp 99.2 F (37.3 C) (Oral)   Resp 16   SpO2 100%   Physical Exam  CONSTITUTIONAL: Well developed/well nourished, patient sleeping on arrival to room HEAD: Normocephalic/atraumatic EYES: EOMI/PERRL ENMT: Mucous membranes moist NECK: supple no meningeal signs SPINE/BACK:entire spine nontender CV: S1/S2 noted, no murmurs/rubs/gallops noted LUNGS: Lungs are clear to auscultation bilaterally, no apparent distress ABDOMEN: soft, mild LUQ tenderness, no rebound or guarding, bowel sounds noted throughout abdomen GU:no cva tenderness NEURO: Pt is awake/alert/appropriate, moves all extremitiesx4.  No facial droop. Patient is sleeping but easily arousable.   EXTREMITIES: pulses normal/equal, full ROM SKIN: warm, color normal PSYCH: no abnormalities of mood noted, alert and oriented to situation   ED Treatments / Results  DIAGNOSTIC STUDIES: Oxygen Saturation is 100% on RA, normal by my interpretation.    COORDINATION OF CARE: 1:02 AM Discussed treatment plan with pt at bedside and pt agreed to plan.   Labs (all labs ordered are listed, but only abnormal results are displayed) Labs Reviewed - No data to display  EKG  EKG Interpretation None       Radiology No results found.  Procedures Procedures (including critical care time)  Medications Ordered in ED Medications - No data to display   Initial Impression / Assessment and Plan / ED Course  I have reviewed the triage vital signs and the nursing notes.    Clinical Course   Pt was just seen in the ED at [facility] reviewed He has multiple ED visits for similar presentation Defer further workup Pt has been resting, no distress noted, no signs of acute surgical abdomen Will discharge home   Final Clinical  Impressions(s) / ED Diagnoses   Final diagnoses:  Generalized abdominal pain    New Prescriptions New Prescriptions   No medications on file   I personally performed the services described in this documentation, which was scribed in my presence. The recorded information has been reviewed and is accurate.        Zadie Rhine, MD 05/09/16 [phone]

## 2016-05-09 NOTE — ED Notes (Signed)
PT LEFT STS HE HAS BEEN WAITING TOO LONG.

## 2016-05-09 NOTE — ED Triage Notes (Signed)
Pt from home with RLQ pain x 3 days. Pt keeps falling asleep during assessment, but states he has had 6 episodes of emesis today. [facility] Pt was given prescriptions, but did not get them filled.  [facility]

## 2016-05-10 ENCOUNTER — Emergency Department (HOSPITAL_COMMUNITY)
Admission: EM | Admit: 2016-05-10 | Discharge: 2016-05-11 | Disposition: A | Discharge status: Home / Self Care | Payer: Self-pay | Attending: Emergency Medicine | Admitting: Emergency Medicine

## 2016-05-10 ENCOUNTER — Encounter (HOSPITAL_COMMUNITY): Payer: Self-pay

## 2016-05-10 DIAGNOSIS — Z87891 Personal history of nicotine dependence: Secondary | ICD-10-CM | POA: Insufficient documentation

## 2016-05-10 DIAGNOSIS — Z79899 Other long term (current) drug therapy: Secondary | ICD-10-CM | POA: Insufficient documentation

## 2016-05-10 DIAGNOSIS — I10 Essential (primary) hypertension: Secondary | ICD-10-CM | POA: Insufficient documentation

## 2016-05-10 DIAGNOSIS — R1084 Generalized abdominal pain: Secondary | ICD-10-CM | POA: Insufficient documentation

## 2016-05-10 LAB — CBC
HCT: 39.4 % (ref 39.0–52.0)
Hemoglobin: 14.3 g/dL (ref 13.0–17.0)
MCH: 31.6 pg (ref 26.0–34.0)
MCHC: 36.3 g/dL — ABNORMAL HIGH (ref 30.0–36.0)
MCV: 87.2 fL (ref 78.0–100.0)
Platelets: 266 10*3/uL (ref 150–400)
RBC: 4.52 MIL/uL (ref 4.22–5.81)
RDW: 12.3 % (ref 11.5–15.5)
WBC: 15.7 10*3/uL — ABNORMAL HIGH (ref 4.0–10.5)

## 2016-05-10 LAB — COMPREHENSIVE METABOLIC PANEL
ALT: 23 U/L (ref 17–63)
AST: 42 U/L — ABNORMAL HIGH (ref 15–41)
Albumin: 5.1 g/dL — ABNORMAL HIGH (ref 3.5–5.0)
Alkaline Phosphatase: 64 U/L (ref 38–126)
Anion gap: 17 — ABNORMAL HIGH (ref 5–15)
BUN: 20 mg/dL (ref 6–20)
CO2: 21 mmol/L — ABNORMAL LOW (ref 22–32)
Calcium: 10.1 mg/dL (ref 8.9–10.3)
Chloride: 94 mmol/L — ABNORMAL LOW (ref 101–111)
Creatinine, Ser: 0.98 mg/dL (ref 0.61–1.24)
GFR calc Af Amer: >60 mL/min (ref >60)
GFR calc non Af Amer: >60 mL/min (ref >60)
Glucose, Bld: 114 mg/dL — ABNORMAL HIGH (ref 65–99)
Potassium: 3 mmol/L — ABNORMAL LOW (ref 3.5–5.1)
Sodium: 132 mmol/L — ABNORMAL LOW (ref 135–145)
Total Bilirubin: 4.6 mg/dL — ABNORMAL HIGH (ref 0.3–1.2)
Total Protein: 8.7 g/dL — ABNORMAL HIGH (ref 6.5–8.1)

## 2016-05-10 LAB — LIPASE, BLOOD: Lipase: 16 U/L (ref 11–51)

## 2016-05-10 LAB — URINALYSIS, ROUTINE W REFLEX MICROSCOPIC
Glucose, UA: NEGATIVE mg/dL
Ketones, ur: 40 mg/dL — AB
Leukocytes, UA: NEGATIVE
Nitrite: NEGATIVE
Protein, ur: 30 mg/dL — AB
Specific Gravity, Urine: 1.037 — ABNORMAL HIGH (ref 1.005–1.030)
pH: 6 (ref 5.0–8.0)

## 2016-05-10 LAB — URINE MICROSCOPIC-ADD ON

## 2016-05-10 MED ORDER — HALOPERIDOL LACTATE 5 MG/ML IJ SOLN: 3.0000 mg | Freq: Once | INTRAMUSCULAR | Status: DC

## 2016-05-10 MED ORDER — METOCLOPRAMIDE HCL 5 MG/ML IJ SOLN
10.0000 mg | Freq: Once | INTRAMUSCULAR | Status: AC
  Administered 2016-05-10: 10 mg via INTRAVENOUS
  Filled 2016-05-10: qty 2

## 2016-05-10 MED ORDER — SODIUM CHLORIDE 0.9 % IV BOLUS (SEPSIS)
1000.0000 mL | Freq: Once | INTRAVENOUS | Status: AC
  Administered 2016-05-10: 1000 mL via INTRAVENOUS

## 2016-05-10 MED ORDER — HALOPERIDOL LACTATE 5 MG/ML IJ SOLN
4.0000 mg | Freq: Once | INTRAMUSCULAR | Status: AC
  Administered 2016-05-10: 4 mg via INTRAVENOUS
  Filled 2016-05-10: qty 1

## 2016-05-10 NOTE — ED Notes (Signed)
Pt. C/o of abdominal pain for 3 days with nausea and vomiting. Stated there were streaks of bright red blood in his vomit yesterday.

## 2016-05-10 NOTE — ED Provider Notes (Signed)
[facility]-EMERG[facility]Y DEPT Provider Note   CSN: [id number] Arrival date & time: 05/10/16  1501     History   Chief Complaint Chief Complaint  Patient presents with  . Abdominal Pain    [name] is a 35 y.o. male.  HPI   35 year old male presents today with abdominal pain. His nose is significant past medical history of gastric, gastroparesis, biliary dyskinesia, H. pylori infection, presents today with complaints of 3 days of generalized abdominal pain. Patient reports associated vomiting, he denies any fever. Patient reports this feels similar to previous episodes, but slightly worse. The patient is simply requesting medication that we'll put him to sleep. Patient denies any fever, bloody vomitus. He denies any focal abdominal pain.    Past Medical History:  Diagnosis Date  . 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 Chronic abdominal pain   . GASTRITIS 12/09/2009   Qualifier: Diagnosis of  By: Daphine Deutscher FNP, Zena Amos    . Gastroparesis   . 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 Nausea and vomiting    chronic, recurrent  . Polysubstance abuse     Patient Active Problem List   Diagnosis Date Noted  . Uncontrollable vomiting 03/10/2016  . Anemia 03/10/2016  . Metatarsal fracture 12/22/2015  . Essential hypertension 12/22/2015  . Night sweats 01/21/2015  . GERD (gastroesophageal reflux disease) 01/21/2015  . Gastritis 12/19/2014  . Hyperbilirubinemia 12/19/2014  . Elevated bilirubin   . Nausea & vomiting 12/14/2014  . Severe tetrahydrocannabinol (THC) dependence (HCC) 12/14/2014  . Alcohol abuse 12/14/2014  . Cyclic vomiting syndrome 12/06/2014  . 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 Gastroparesis 05/11/2012  . Abdominal pain 05/06/2012  . Polysubstance abuse 05/06/2012  . CHOLECYSTECTOMY, HX OF 11/25/2009    Past Surgical  History:  Procedure Laterality Date  . CHOLECYSTECTOMY  11/2009       Home Medications    Prior to Admission medications   Medication Sig Start Date End Date Taking? Authorizing Provider  lisinopril (PRINIVIL,ZESTRIL) 5 MG tablet Take 1 tablet (5 mg total) by mouth daily. 12/22/15  Yes Jaclyn Shaggy, MD  metoCLOPramide (REGLAN) 10 MG tablet Take 1 tablet (10 mg total) by mouth every 6 (six) hours. 05/08/16  Yes Donnetta Hutching, MD  omeprazole (PRILOSEC) 20 MG capsule TAKE 1 CAPSULE BY MOUTH DAILY. 02/22/16  Yes Jaclyn Shaggy, MD  promethazine (PHENERGAN) 25 MG tablet Take 1 tablet (25 mg total) by mouth every 6 (six) hours as needed. Patient not taking: Reported on 05/09/2016 05/08/16   Donnetta Hutching, MD    Family History Family History  Problem Relation Age of Onset  . Other Father   . Diabetes Father   . Hypertension Father     Social History Social History  Substance Use Topics  . Smoking status: Former Smoker    Years: 8.00    Types: Cigars    Quit date: 12/15/2014  . Smokeless tobacco: Never Used  . Alcohol use No     Comment: 12/16/2014 " I no Longer drink ,I quit about 2 years ago "     Allergies   Review of patient's allergies indicates no known allergies.   Review of Systems Review of Systems  All other systems reviewed and are negative.   Physical Exam Updated Vital Signs BP (!) 91/37   Pulse (!) 57   Temp 98.6 F (37  C) (Oral)   Resp 16   SpO2 98%   Physical Exam  Constitutional: He is oriented to person, place, and time. He appears well-developed and well-nourished.  HENT:  Head: Normocephalic and atraumatic.  Eyes: Conjunctivae are normal. Pupils are equal, round, and reactive to light. Right eye exhibits no discharge. Left eye exhibits no discharge. No scleral icterus.  Neck: Normal range of motion. No JVD present. No tracheal deviation present.  Pulmonary/Chest: Effort normal. No stridor.  Abdominal: There is tenderness.  Generalized abdominal tenderness.  No focal findings  Neurological: He is alert and oriented to person, place, and time. Coordination normal.  Psychiatric: He has a normal mood and affect. His behavior is normal. Judgment and thought content normal.  Nursing note and vitals reviewed.    ED Treatments / Results  Labs (all labs ordered are listed, but only abnormal results are displayed) Labs Reviewed  COMPREHENSIVE METABOLIC PANEL - Abnormal; Notable for the following:       Result Value   Sodium 132 (*)    Potassium 3.0 (*)    Chloride 94 (*)    CO2 21 (*)    Glucose, Bld 114 (*)    Total Protein 8.7 (*)    Albumin 5.1 (*)    AST 42 (*)    Total Bilirubin 4.6 (*)    Anion gap 17 (*)    All other components within normal limits  CBC - Abnormal; Notable for the following:    WBC 15.7 (*)    MCHC 36.3 (*)    All other components within normal limits  URINALYSIS, ROUTINE W REFLEX MICROSCOPIC (NOT [facility]) - Abnormal; Notable for the following:    Color, Urine AMBER (*)    Specific Gravity, Urine 1.037 (*)    Hgb urine dipstick TRACE (*)    Bilirubin Urine SMALL (*)    Ketones, ur 40 (*)    Protein, ur 30 (*)    All other components within normal limits  URINE MICROSCOPIC-ADD ON - Abnormal; Notable for the following:    Squamous Epithelial / LPF 0-5 (*)    Bacteria, UA FEW (*)    All other components within normal limits  LIPASE, BLOOD    EKG  EKG Interpretation None       Radiology No results found.  Procedures Procedures (including critical care time)  Medications Ordered in ED Medications  metoCLOPramide (REGLAN) injection 10 mg (10 mg Intravenous Given 05/10/16 1925)  haloperidol lactate (HALDOL) injection 4 mg (4 mg Intravenous Given 05/10/16 1925)  sodium chloride 0.9 % bolus 1,000 mL (0 mLs Intravenous Stopped 05/11/16 0000)     Initial Impression / Assessment and Plan / ED Course  I have reviewed the triage vital signs and the nursing notes.  Pertinent labs & imaging results that were  available during my care of the patient were reviewed by me and considered in my medical decision making (see chart for details).  Clinical Course    Labs:  Imaging:  Consults:  Therapeutics:  Discharge Meds:   Assessment/Plan: They're social male with a history of chronic abdominal pain presents today with acute episode. He reports associated nausea, denies fever. Patient is requesting medication to go to sleep haldol.  Patient was given medication here in the ED, he rested comfortably throughout his stay. Reevaluation shows dramatic improvement in his symptoms. Patient's labs are consistent with previous, no concerning findings that would require further evaluation or management here in the ED. Patient discharged home with  instructions to continue using at home medications, follow up with primary care, return to the emergency room immediately if he starts is any new or worsening signs or symptoms. Patient verbalized understanding and agreement to this plan of her questions, concerns      Final Clinical Impressions(s) / ED Diagnoses   Final diagnoses:  Generalized abdominal pain    New Prescriptions Discharge Medication List as of 05/11/2016 12:30 AM       Eyvonne Mechanic, PA-C 05/11/16 0104    Jerelyn Scott, MD 05/11/16 0111

## 2016-05-10 NOTE — ED Triage Notes (Signed)
Pt was here yesterday and left d/t wait times.  Brought in by EMS today.  Pt c/o abdominal pain. N/V for a while.  Been told in past to stop marijuana.  Vitals: 149/91, hr 71, 99%ra

## 2016-05-11 NOTE — Discharge Instructions (Signed)
Please read attached information. If you experience any new or worsening signs or symptoms please return to the emergency room for evaluation. Please follow-up with your primary care provider or specialist as discussed.  °

## 2016-07-14 ENCOUNTER — Encounter (HOSPITAL_COMMUNITY): Payer: Self-pay

## 2016-07-14 ENCOUNTER — Emergency Department (HOSPITAL_COMMUNITY)
Admission: EM | Admit: 2016-07-14 | Discharge: 2016-07-14 | Disposition: A | Discharge status: Home / Self Care | Payer: Self-pay | Attending: Emergency Medicine | Admitting: Emergency Medicine

## 2016-07-14 DIAGNOSIS — R1115 Cyclical vomiting syndrome unrelated to migraine: Secondary | ICD-10-CM

## 2016-07-14 DIAGNOSIS — R112 Nausea with vomiting, unspecified: Secondary | ICD-10-CM | POA: Insufficient documentation

## 2016-07-14 DIAGNOSIS — I10 Essential (primary) hypertension: Secondary | ICD-10-CM | POA: Insufficient documentation

## 2016-07-14 DIAGNOSIS — Z87891 Personal history of nicotine dependence: Secondary | ICD-10-CM | POA: Insufficient documentation

## 2016-07-14 DIAGNOSIS — Z79899 Other long term (current) drug therapy: Secondary | ICD-10-CM | POA: Insufficient documentation

## 2016-07-14 LAB — COMPREHENSIVE METABOLIC PANEL
ALT: 22 U/L (ref 17–63)
AST: 28 U/L (ref 15–41)
Albumin: 4.8 g/dL (ref 3.5–5.0)
Alkaline Phosphatase: 78 U/L (ref 38–126)
Anion gap: 14 (ref 5–15)
BUN: 13 mg/dL (ref 6–20)
CO2: 23 mmol/L (ref 22–32)
Calcium: 10 mg/dL (ref 8.9–10.3)
Chloride: 104 mmol/L (ref 101–111)
Creatinine, Ser: 1.1 mg/dL (ref 0.61–1.24)
GFR calc Af Amer: >60 mL/min (ref >60)
GFR calc non Af Amer: >60 mL/min (ref >60)
Glucose, Bld: 173 mg/dL — ABNORMAL HIGH (ref 65–99)
Potassium: 3 mmol/L — ABNORMAL LOW (ref 3.5–5.1)
Sodium: 141 mmol/L (ref 135–145)
Total Bilirubin: 1.5 mg/dL — ABNORMAL HIGH (ref 0.3–1.2)
Total Protein: 8 g/dL (ref 6.5–8.1)

## 2016-07-14 LAB — CBC WITH DIFFERENTIAL/PLATELET
Basophils Absolute: 0 10*3/uL (ref 0.0–0.1)
Basophils Relative: 0 %
Eosinophils Absolute: 0 10*3/uL (ref 0.0–0.7)
Eosinophils Relative: 0 %
HCT: 39.9 % (ref 39.0–52.0)
Hemoglobin: 13.7 g/dL (ref 13.0–17.0)
Lymphocytes Relative: 11 %
Lymphs Abs: 2 10*3/uL (ref 0.7–4.0)
MCH: 31.8 pg (ref 26.0–34.0)
MCHC: 34.3 g/dL (ref 30.0–36.0)
MCV: 92.6 fL (ref 78.0–100.0)
Monocytes Absolute: 1 10*3/uL (ref 0.1–1.0)
Monocytes Relative: 6 %
Neutro Abs: 14.8 10*3/uL — ABNORMAL HIGH (ref 1.7–7.7)
Neutrophils Relative %: 83 %
Platelets: 277 10*3/uL (ref 150–400)
RBC: 4.31 MIL/uL (ref 4.22–5.81)
RDW: 12.2 % (ref 11.5–15.5)
WBC: 17.8 10*3/uL — ABNORMAL HIGH (ref 4.0–10.5)

## 2016-07-14 LAB — RAPID URINE DRUG SCREEN, HOSP PERFORMED
Amphetamines: NOT DETECTED
Barbiturates: NOT DETECTED
Benzodiazepines: NOT DETECTED
Cocaine: NOT DETECTED
Opiates: NOT DETECTED
Tetrahydrocannabinol: POSITIVE — AB

## 2016-07-14 LAB — URINE MICROSCOPIC-ADD ON: RBC / HPF: NONE SEEN RBC/hpf (ref 0–5)

## 2016-07-14 LAB — URINALYSIS, ROUTINE W REFLEX MICROSCOPIC
Glucose, UA: NEGATIVE mg/dL
Hgb urine dipstick: NEGATIVE
Ketones, ur: NEGATIVE mg/dL
Leukocytes, UA: NEGATIVE
Nitrite: NEGATIVE
Protein, ur: 30 mg/dL — AB
Specific Gravity, Urine: 1.038 — ABNORMAL HIGH (ref 1.005–1.030)
pH: 5 (ref 5.0–8.0)

## 2016-07-14 LAB — LIPASE, BLOOD: Lipase: 14 U/L (ref 11–51)

## 2016-07-14 MED ORDER — MORPHINE SULFATE (PF) 4 MG/ML IV SOLN
4.0000 mg | Freq: Once | INTRAVENOUS | Status: AC
  Administered 2016-07-14: 4 mg via INTRAVENOUS
  Filled 2016-07-14: qty 1

## 2016-07-14 MED ORDER — ONDANSETRON HCL 8 MG PO TABS: 8.0000 mg | ORAL_TABLET | Freq: Three times a day (TID) | ORAL | 0 refills | Status: DC | PRN

## 2016-07-14 MED ORDER — ONDANSETRON HCL 4 MG/2ML IJ SOLN
4.0000 mg | Freq: Once | INTRAMUSCULAR | Status: AC
Start: 2016-07-14 — End: 2016-07-14
  Administered 2016-07-14: 4 mg via INTRAVENOUS
  Filled 2016-07-14: qty 2

## 2016-07-14 MED ORDER — SODIUM CHLORIDE 0.9 % IV BOLUS (SEPSIS)
1000.0000 mL | Freq: Once | INTRAVENOUS | Status: AC
  Administered 2016-07-14: 1000 mL via INTRAVENOUS

## 2016-07-14 MED ORDER — ONDANSETRON HCL 4 MG/2ML IJ SOLN: 4.0000 mg | Freq: Once | INTRAMUSCULAR | Status: DC

## 2016-07-14 MED ORDER — METOCLOPRAMIDE HCL 5 MG/ML IJ SOLN
10.0000 mg | Freq: Once | INTRAMUSCULAR | Status: AC
  Administered 2016-07-14: 10 mg via INTRAVENOUS
  Filled 2016-07-14: qty 2

## 2016-07-14 MED ORDER — HALOPERIDOL LACTATE 5 MG/ML IJ SOLN
2.0000 mg | Freq: Once | INTRAMUSCULAR | Status: AC
  Administered 2016-07-14: 2 mg via INTRAVENOUS
  Filled 2016-07-14: qty 1

## 2016-07-14 NOTE — ED Notes (Signed)
PLACED PATIENT INTO A GOWN AND ON THE MONITOR

## 2016-07-14 NOTE — ED Notes (Signed)
Found pt on floor while rounding. Pt stated he did not fall and that he was lying on the floor because it felt better. Helped pt back into stretcher and told him that he cannot lay on the floor.

## 2016-07-14 NOTE — ED Triage Notes (Signed)
Pt brought in [facility]. Pt c/o nausea, vomiting, and diarrhea for the past 2 days. Pt also complaining of abdominal pain. Pt given 4mg  of zofran on transport.

## 2016-07-14 NOTE — ED Provider Notes (Signed)
[facility]-EMERGENCY DEPT Provider Note   CSN: [id number] Arrival date & time: 07/14/16  1635     History   Chief Complaint Chief Complaint  Patient presents with  . Emesis    [name] is a 35 y.o. male.  Multiple emergency visits for cyclic vomiting syndrome related to marijuana smoking. Patient presents today with nausea, vomiting, and diarrhea for 2 days with associated generalized abdominal cramping. No fever, sweats, chills, chest pain, dyspnea. Past medical history reveals history of cyclic vomiting syndrome, polysubstance abuse, gastroparesis.  Severity of symptoms is moderate. Nothing makes symptoms better or worse      Past Medical History:  Diagnosis Date  . 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 Chronic abdominal pain   . GASTRITIS 12/09/2009   Qualifier: Diagnosis of  By: Daphine DeutscherMartin FNP, Zena AmosNykedtra    . Gastroparesis   . HELICOBACTER PYLORI INFECTION 12/14/2009   Qualifier: Diagnosis of  By: Levon Hedgerraddock, Brenda    . Nausea and vomiting    chronic, recurrent  . Polysubstance abuse     Patient Active Problem List   Diagnosis Date Noted  . Uncontrollable vomiting 03/10/2016  . Anemia 03/10/2016  . Metatarsal fracture 12/22/2015  . Essential hypertension 12/22/2015  . Night sweats 01/21/2015  . GERD (gastroesophageal reflux disease) 01/21/2015  . Gastritis 12/19/2014  . Hyperbilirubinemia 12/19/2014  . Elevated bilirubin   . Nausea & vomiting 12/14/2014  . Severe tetrahydrocannabinol (THC) dependence (HCC) 12/14/2014  . Alcohol abuse 12/14/2014  . Cyclic vomiting syndrome 12/06/2014  . 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 Gastroparesis 05/11/2012  . Abdominal pain 05/06/2012  . Polysubstance abuse 05/06/2012  . CHOLECYSTECTOMY, HX OF 11/25/2009    Past Surgical History:  Procedure Laterality Date  . CHOLECYSTECTOMY  11/2009       Home Medications    Prior to  Admission medications   Medication Sig Start Date End Date Taking? Authorizing Provider  lisinopril (PRINIVIL,ZESTRIL) 5 MG tablet Take 1 tablet (5 mg total) by mouth daily. 12/22/15   Jaclyn ShaggyEnobong Amao, MD  metoCLOPramide (REGLAN) 10 MG tablet Take 1 tablet (10 mg total) by mouth every 6 (six) hours. 05/08/16   Donnetta HutchingBrian Casia Corti, MD  omeprazole (PRILOSEC) 20 MG capsule TAKE 1 CAPSULE BY MOUTH DAILY. 02/22/16   Jaclyn ShaggyEnobong Amao, MD  ondansetron (ZOFRAN) 8 MG tablet Take 1 tablet (8 mg total) by mouth every 8 (eight) hours as needed for nausea or vomiting. 07/14/16   Donnetta HutchingBrian Deah Ottaway, MD  promethazine (PHENERGAN) 25 MG tablet Take 1 tablet (25 mg total) by mouth every 6 (six) hours as needed. Patient not taking: Reported on 05/09/2016 05/08/16   Donnetta HutchingBrian Kiante Petrovich, MD    Family History Family History  Problem Relation Age of Onset  . Other Father   . Diabetes Father   . Hypertension Father     Social History Social History  Substance Use Topics  . Smoking status: Former Smoker    Years: 8.00    Types: Cigars    Quit date: 12/15/2014  . Smokeless tobacco: Never Used  . Alcohol use No     Comment: 12/16/2014 " I no Longer drink ,I quit about 2 years ago "     Allergies   Patient has no known allergies.   Review of Systems Review of Systems  All other systems reviewed and are negative.    Physical Exam Updated Vital Signs BP 132/83   Pulse 71  Resp 16   SpO2 100%   Physical Exam  Constitutional: He is oriented to person, place, and time. He appears well-developed and well-nourished.  HENT:  Head: Normocephalic and atraumatic.  Eyes: Conjunctivae are normal.  Neck: Neck supple.  Cardiovascular: Normal rate and regular rhythm.   Pulmonary/Chest: Effort normal and breath sounds normal.  Abdominal: Soft. Bowel sounds are normal.  Minimal generalized tenderness  Musculoskeletal: Normal range of motion.  Neurological: He is alert and oriented to person, place, and time.  Skin: Skin is warm and dry.    Psychiatric: He has a normal mood and affect. His behavior is normal.  Nursing note and vitals reviewed.    ED Treatments / Results  Labs (all labs ordered are listed, but only abnormal results are displayed) Labs Reviewed  CBC WITH DIFFERENTIAL/PLATELET - Abnormal; Notable for the following:       Result Value   WBC 17.8 (*)    Neutro Abs 14.8 (*)    All other components within normal limits  COMPREHENSIVE METABOLIC PANEL - Abnormal; Notable for the following:    Potassium 3.0 (*)    Glucose, Bld 173 (*)    Total Bilirubin 1.5 (*)    All other components within normal limits  URINALYSIS, ROUTINE W REFLEX MICROSCOPIC (NOT AT [facility]) - Abnormal; Notable for the following:    Color, Urine AMBER (*)    APPearance HAZY (*)    Specific Gravity, Urine 1.038 (*)    Bilirubin Urine SMALL (*)    Protein, ur 30 (*)    All other components within normal limits  RAPID URINE DRUG SCREEN, HOSP PERFORMED - Abnormal; Notable for the following:    Tetrahydrocannabinol POSITIVE (*)    All other components within normal limits  URINE MICROSCOPIC-ADD ON - Abnormal; Notable for the following:    Squamous Epithelial / LPF 0-5 (*)    Bacteria, UA RARE (*)    All other components within normal limits  LIPASE, BLOOD    EKG  EKG Interpretation  Date/Time:  Thursday July 14 2016 16:57:10 EST Ventricular Rate:  94 PR Interval:    QRS Duration: 112 QT Interval:  364 QTC Calculation: 456 R Axis:   -114 Text Interpretation:  Sinus rhythm LAE, consider biatrial enlargement Left anterior fascicular block RSR' in V1 or V2, right VCD or RVH Consider anterior infarct Baseline wander in lead(s) V2 Confirmed by Tenasia Aull  MD, Nicholaus Steinke ([id number]) on 07/14/2016 5:59:31 PM       Radiology No results found.  Procedures Procedures (including critical care time)  Medications Ordered in ED Medications  ondansetron (ZOFRAN) injection 4 mg (4 mg Intravenous Given 07/14/16 1725)  sodium chloride 0.9 % bolus  1,000 mL (0 mLs Intravenous Stopped 07/14/16 1954)  morphine 4 MG/ML injection 4 mg (4 mg Intravenous Given 07/14/16 1725)  sodium chloride 0.9 % bolus 1,000 mL (0 mLs Intravenous Stopped 07/14/16 1856)  sodium chloride 0.9 % bolus 1,000 mL (0 mLs Intravenous Stopped 07/14/16 2105)  morphine 4 MG/ML injection 4 mg (4 mg Intravenous Given 07/14/16 1852)  metoCLOPramide (REGLAN) injection 10 mg (10 mg Intravenous Given 07/14/16 1853)  haloperidol lactate (HALDOL) injection 2 mg (2 mg Intravenous Given 07/14/16 2104)     Initial Impression / Assessment and Plan / ED Course  I have reviewed the triage vital signs and the nursing notes.  Pertinent labs & imaging results that were available during my care of the patient were reviewed by me and considered in my medical decision making (  see chart for details).  Clinical Course     Multiple ED visits for similar symptoms. Patient feels much better after IV fluids, IV Zofran. White count noted to be elevated, but it has been elevated in the past. He is ambulatory at discharge. No acute abdomen. Discharge medications Zofran 8 mg  Final Clinical Impressions(s) / ED Diagnoses   Final diagnoses:  Intractable cyclical vomiting with nausea    New Prescriptions New Prescriptions   ONDANSETRON (ZOFRAN) 8 MG TABLET    Take 1 tablet (8 mg total) by mouth every 8 (eight) hours as needed for nausea or vomiting.     Donnetta HutchingBrian Jahari Wiginton, MD 07/14/16 2149

## 2016-07-14 NOTE — Discharge Instructions (Signed)
Medication for nausea.  Clear liquids.  Need to get a primary care dr

## 2016-07-16 ENCOUNTER — Encounter (HOSPITAL_COMMUNITY): Payer: Self-pay | Admitting: Emergency Medicine

## 2016-07-16 ENCOUNTER — Emergency Department (HOSPITAL_COMMUNITY)
Admission: EM | Admit: 2016-07-16 | Discharge: 2016-07-16 | Disposition: A | Discharge status: Home / Self Care | Payer: Self-pay | Attending: Emergency Medicine | Admitting: Emergency Medicine

## 2016-07-16 DIAGNOSIS — Z87891 Personal history of nicotine dependence: Secondary | ICD-10-CM | POA: Insufficient documentation

## 2016-07-16 DIAGNOSIS — R112 Nausea with vomiting, unspecified: Secondary | ICD-10-CM | POA: Insufficient documentation

## 2016-07-16 DIAGNOSIS — Z79899 Other long term (current) drug therapy: Secondary | ICD-10-CM | POA: Insufficient documentation

## 2016-07-16 DIAGNOSIS — I10 Essential (primary) hypertension: Secondary | ICD-10-CM | POA: Insufficient documentation

## 2016-07-16 LAB — CBC WITH DIFFERENTIAL/PLATELET
Basophils Absolute: 0 10*3/uL (ref 0.0–0.1)
Basophils Relative: 0 %
Eosinophils Absolute: 0 10*3/uL (ref 0.0–0.7)
Eosinophils Relative: 0 %
HCT: 37.2 % — ABNORMAL LOW (ref 39.0–52.0)
Hemoglobin: 12.4 g/dL — ABNORMAL LOW (ref 13.0–17.0)
Lymphocytes Relative: 10 %
Lymphs Abs: 1 10*3/uL (ref 0.7–4.0)
MCH: 30.8 pg (ref 26.0–34.0)
MCHC: 33.3 g/dL (ref 30.0–36.0)
MCV: 92.5 fL (ref 78.0–100.0)
Monocytes Absolute: 0.5 10*3/uL (ref 0.1–1.0)
Monocytes Relative: 5 %
Neutro Abs: 8.7 10*3/uL — ABNORMAL HIGH (ref 1.7–7.7)
Neutrophils Relative %: 85 %
Platelets: 250 10*3/uL (ref 150–400)
RBC: 4.02 MIL/uL — ABNORMAL LOW (ref 4.22–5.81)
RDW: 12.6 % (ref 11.5–15.5)
WBC: 10.2 10*3/uL (ref 4.0–10.5)

## 2016-07-16 LAB — COMPREHENSIVE METABOLIC PANEL
ALT: 21 U/L (ref 17–63)
AST: 33 U/L (ref 15–41)
Albumin: 4.7 g/dL (ref 3.5–5.0)
Alkaline Phosphatase: 63 U/L (ref 38–126)
Anion gap: 12 (ref 5–15)
BUN: 13 mg/dL (ref 6–20)
CO2: 22 mmol/L (ref 22–32)
Calcium: 9.7 mg/dL (ref 8.9–10.3)
Chloride: 105 mmol/L (ref 101–111)
Creatinine, Ser: 1.05 mg/dL (ref 0.61–1.24)
GFR calc Af Amer: >60 mL/min (ref >60)
GFR calc non Af Amer: >60 mL/min (ref >60)
Glucose, Bld: 157 mg/dL — ABNORMAL HIGH (ref 65–99)
Potassium: 3.4 mmol/L — ABNORMAL LOW (ref 3.5–5.1)
Sodium: 139 mmol/L (ref 135–145)
Total Bilirubin: 1.9 mg/dL — ABNORMAL HIGH (ref 0.3–1.2)
Total Protein: 7.8 g/dL (ref 6.5–8.1)

## 2016-07-16 LAB — URINALYSIS, ROUTINE W REFLEX MICROSCOPIC
Bilirubin Urine: NEGATIVE
Glucose, UA: 100 mg/dL — AB
Hgb urine dipstick: NEGATIVE
Ketones, ur: NEGATIVE mg/dL
Leukocytes, UA: NEGATIVE
Nitrite: NEGATIVE
Protein, ur: NEGATIVE mg/dL
Specific Gravity, Urine: 1.029 (ref 1.005–1.030)
pH: 5.5 (ref 5.0–8.0)

## 2016-07-16 LAB — LIPASE, BLOOD: Lipase: 34 U/L (ref 11–51)

## 2016-07-16 MED ORDER — HALOPERIDOL LACTATE 5 MG/ML IJ SOLN
2.0000 mg | Freq: Once | INTRAMUSCULAR | Status: AC
  Administered 2016-07-16: 2 mg via INTRAVENOUS
  Filled 2016-07-16: qty 1

## 2016-07-16 MED ORDER — KETOROLAC TROMETHAMINE 30 MG/ML IJ SOLN
30.0000 mg | Freq: Once | INTRAMUSCULAR | Status: AC
  Administered 2016-07-16: 30 mg via INTRAVENOUS
  Filled 2016-07-16: qty 1

## 2016-07-16 MED ORDER — SODIUM CHLORIDE 0.9 % IV BOLUS (SEPSIS)
1000.0000 mL | Freq: Once | INTRAVENOUS | Status: AC
  Administered 2016-07-16: 1000 mL via INTRAVENOUS

## 2016-07-16 NOTE — ED Notes (Signed)
Bed: WA18 Expected date:  Expected time:  Means of arrival:  Comments: EMS-vomiting 

## 2016-07-16 NOTE — ED Notes (Signed)
Unable to attempt fluid challenge d/t pt vomiting

## 2016-07-16 NOTE — ED Notes (Signed)
Patient d/c'd self care.  F/U reviewed with patient.  Patient verbalized understanding. 

## 2016-07-16 NOTE — Discharge Instructions (Signed)
Please read and follow all provided instructions.  Your diagnoses today include:  1. Non-intractable vomiting with nausea, unspecified vomiting type     Tests performed today include:  Blood counts and electrolytes  Blood tests to check liver and kidney function  Blood tests to check pancreas function  Vital signs. See below for your results today.   Medications prescribed:   None  Take any prescribed medications only as directed.  Home care instructions:   Follow any educational materials contained in this packet.   You should rest for the next several days. Keep drinking plenty of fluids and use the medicine for nausea as directed.    Drink clear liquids for the next 24 hours and introduce solid foods slowly after 24 hours using the b.r.a.t. diet (Bananas, Rice, Applesauce, Toast, Yogurt).    Follow-up instructions: Please follow-up with your primary care provider in the next 2 days for further evaluation of your symptoms. If you are not feeling better in 48 hours you may have a condition that is more serious and you need re-evaluation.   Return instructions:  SEEK IMMEDIATE MEDICAL ATTENTION IF:  If you have pain that does not go away or becomes severe   A temperature above 101F develops   Repeated vomiting occurs (multiple episodes)   If you have pain that becomes localized to portions of the abdomen. The right side could possibly be appendicitis. In an adult, the left lower portion of the abdomen could be colitis or diverticulitis.   Blood is being passed in stools or vomit (bright red or black tarry stools)   You develop chest pain, difficulty breathing, dizziness or fainting, or become confused, poorly responsive, or inconsolable (young children)  If you have any other emergent concerns regarding your health  Additional Information: Abdominal (belly) pain can be caused by many things. Your caregiver performed an examination and possibly ordered blood/urine  tests and imaging (CT scan, x-rays, ultrasound). Many cases can be observed and treated at home after initial evaluation in the emergency department. Even though you are being discharged home, abdominal pain can be unpredictable. Therefore, you need a repeated exam if your pain does not resolve, returns, or worsens. Most patients with abdominal pain don't have to be admitted to the hospital or have surgery, but serious problems like appendicitis and gallbladder attacks can start out as nonspecific pain. Many abdominal conditions cannot be diagnosed in one visit, so follow-up evaluations are very important.  Your vital signs today were: BP 151/86 (BP Location: Right Arm)    Pulse 63    Temp 99.4 F (37.4 C) (Oral)    Resp 22    SpO2 97%  If your blood pressure (bp) was elevated above 135/85 this visit, please have this repeated by your doctor within one month. --------------

## 2016-07-16 NOTE — ED Provider Notes (Signed)
[facility]-EMERGENCY DEPT Provider Note   CSN: [id number] Arrival date & time: 07/16/16  1106     History   Chief Complaint Chief Complaint  Patient presents with  . Emesis  . Abdominal Pain    [name] is a 35 y.o. male.  Patient with history of chronic nausea and vomiting, delayed gastric emptying, frequent emergency department visits for the same, marijuana use -- presents with complaint of abdominal pain and vomiting. Patient was seen in emergency department 2 days ago. He states that when he left he felt somewhat better. Symptoms worsened again this morning. EMS was called. Patient actively vomiting in route to the hospital. Patient was given 4 mg of Zofran. Symptoms are typical for previous flares. The onset of this condition was acute. The course is constant. Aggravating factors: none. Alleviating factors: none.        Past Medical History:  Diagnosis Date  . 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 Chronic abdominal pain   . GASTRITIS 12/09/2009   Qualifier: Diagnosis of  By: Daphine DeutscherMartin FNP, Zena AmosNykedtra    . Gastroparesis   . HELICOBACTER PYLORI INFECTION 12/14/2009   Qualifier: Diagnosis of  By: Levon Hedgerraddock, Brenda    . Nausea and vomiting    chronic, recurrent  . Polysubstance abuse     Patient Active Problem List   Diagnosis Date Noted  . Uncontrollable vomiting 03/10/2016  . Anemia 03/10/2016  . Metatarsal fracture 12/22/2015  . Essential hypertension 12/22/2015  . Night sweats 01/21/2015  . GERD (gastroesophageal reflux disease) 01/21/2015  . Gastritis 12/19/2014  . Hyperbilirubinemia 12/19/2014  . Elevated bilirubin   . Nausea & vomiting 12/14/2014  . Severe tetrahydrocannabinol (THC) dependence (HCC) 12/14/2014  . Alcohol abuse 12/14/2014  . Cyclic vomiting syndrome 12/06/2014  . 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 Gastroparesis 05/11/2012  . Abdominal pain  05/06/2012  . Polysubstance abuse 05/06/2012  . CHOLECYSTECTOMY, HX OF 11/25/2009    Past Surgical History:  Procedure Laterality Date  . CHOLECYSTECTOMY  11/2009       Home Medications    Prior to Admission medications   Medication Sig Start Date End Date Taking? Authorizing Provider  lisinopril (PRINIVIL,ZESTRIL) 5 MG tablet Take 1 tablet (5 mg total) by mouth daily. 12/22/15   Jaclyn ShaggyEnobong Amao, MD  metoCLOPramide (REGLAN) 10 MG tablet Take 1 tablet (10 mg total) by mouth every 6 (six) hours. 05/08/16   Donnetta HutchingBrian Cook, MD  omeprazole (PRILOSEC) 20 MG capsule TAKE 1 CAPSULE BY MOUTH DAILY. 02/22/16   Jaclyn ShaggyEnobong Amao, MD  ondansetron (ZOFRAN) 8 MG tablet Take 1 tablet (8 mg total) by mouth every 8 (eight) hours as needed for nausea or vomiting. 07/14/16   Donnetta HutchingBrian Cook, MD  promethazine (PHENERGAN) 25 MG tablet Take 1 tablet (25 mg total) by mouth every 6 (six) hours as needed. Patient not taking: Reported on 05/09/2016 05/08/16   Donnetta HutchingBrian Cook, MD    Family History Family History  Problem Relation Age of Onset  . Other Father   . Diabetes Father   . Hypertension Father     Social History Social History  Substance Use Topics  . Smoking status: Former Smoker    Years: 8.00    Types: Cigars    Quit date: 12/15/2014  . Smokeless tobacco: Never Used  . Alcohol use No     Comment: 12/16/2014 " I no Longer drink ,I quit about 2 years ago "  Allergies   Patient has no known allergies.   Review of Systems Review of Systems  Constitutional: Negative for fever.  HENT: Negative for rhinorrhea and sore throat.   Eyes: Negative for redness.  Respiratory: Negative for cough.   Cardiovascular: Negative for chest pain.  Gastrointestinal: Positive for abdominal pain, nausea and vomiting. Negative for diarrhea.  Genitourinary: Negative for dysuria.  Musculoskeletal: Negative for myalgias.  Skin: Negative for rash.  Neurological: Negative for headaches.     Physical Exam Updated Vital  Signs BP 110/92 (BP Location: Right Arm)   Pulse 85   Temp 97.9 F (36.6 C)   Resp 16   SpO2 98%   Physical Exam  Constitutional: He appears well-developed and well-nourished. He appears distressed.  Patient crying and dry heaving during exam.   HENT:  Head: Normocephalic and atraumatic.  Eyes: Conjunctivae are normal. Right eye exhibits no discharge. Left eye exhibits no discharge.  Neck: Normal range of motion. Neck supple.  Cardiovascular: Normal rate, regular rhythm and normal heart sounds.   Pulmonary/Chest: Effort normal and breath sounds normal.  Abdominal: Soft. He exhibits no distension. There is tenderness (Generalized). There is guarding. There is no rebound.  Neurological: He is alert.  Skin: Skin is warm and dry.  Psychiatric: He has a normal mood and affect.  Nursing note and vitals reviewed.    ED Treatments / Results  Labs (all labs ordered are listed, but only abnormal results are displayed) Labs Reviewed  CBC WITH DIFFERENTIAL/PLATELET - Abnormal; Notable for the following:       Result Value   RBC 4.02 (*)    Hemoglobin 12.4 (*)    HCT 37.2 (*)    Neutro Abs 8.7 (*)    All other components within normal limits  COMPREHENSIVE METABOLIC PANEL - Abnormal; Notable for the following:    Potassium 3.4 (*)    Glucose, Bld 157 (*)    Total Bilirubin 1.9 (*)    All other components within normal limits  URINALYSIS, ROUTINE W REFLEX MICROSCOPIC (NOT AT [facility]) - Abnormal; Notable for the following:    Glucose, UA 100 (*)    All other components within normal limits  LIPASE, BLOOD     Procedures Procedures (including critical care time)  Medications Ordered in ED Medications  haloperidol lactate (HALDOL) injection 2 mg (not administered)     Initial Impression / Assessment and Plan / ED Course  I have reviewed the triage vital signs and the nursing notes.  Pertinent labs & imaging results that were available during my care of the patient were reviewed  by me and considered in my medical decision making (see chart for details).  Clinical Course    Patient seen and examined. Discussed with Dr. Rhunette CroftNanavati. Will give 2mg  IV haldol. Reviewed EKG from 11/30, no prolonged QTc. Will recheck labs, noting that WBC count is typically elevated and patient has had multiple CT scans in the past year.   Vital signs reviewed and are as follows: BP 110/92 (BP Location: Right Arm)   Pulse 85   Temp 97.9 F (36.6 C)   Resp 16   SpO2 98%   3:52 PM Patient comfortable and sleeping in room. States he is feeling a little better. Encouraged some PO fluids.   5:22 PM Patient stable. Feel safe for discharge at this time.   Final Clinical Impressions(s) / ED Diagnoses   Final diagnoses:  Non-intractable vomiting with nausea, unspecified vomiting type   Patient with his typical symptoms  of abd pain and vomiting. WBC is normal today. No vomiting after treatments. Vitals are stable, no fever. No signs of dehydration. Lungs are clear. No focal abdominal pain. Low concern for appendicitis, cholecystitis, pancreatitis, ruptured viscus, UTI, kidney stone, aortic dissection, aortic aneurysm or other emergent abdominal etiology. Supportive therapy indicated with return if symptoms worsen. Patient counseled.   New Prescriptions New Prescriptions   No medications on file     Renne Crigler, PA-C 07/16/16 1723    Derwood Kaplan, MD 07/17/16 [phone]

## 2016-07-16 NOTE — ED Triage Notes (Signed)
Pt [facility] for the same emesis and abd pain. PA at bedside

## 2016-07-16 NOTE — ED Notes (Signed)
Pt required loud vocal stimuli to reassess pain. Pt reports 7/10, fell back to sleep immediately. Pt also removed all monitoring equipment.

## 2016-07-16 NOTE — ED Notes (Signed)
Patient given ginger ale to drink.  Patient held down fluids.

## 2016-07-16 NOTE — ED Triage Notes (Signed)
Pt from home via EMS c/o emesis and RLQ pain x several months. Pt has rx for Zofran but did not have it filled. EMS reports that pt vomited on the back of the truck. Pt received 4 Zofran en route. Pt is A&O and in NAD

## 2016-07-18 ENCOUNTER — Encounter (HOSPITAL_COMMUNITY): Payer: Self-pay | Admitting: Emergency Medicine

## 2016-07-18 ENCOUNTER — Emergency Department (HOSPITAL_COMMUNITY): Payer: Self-pay

## 2016-07-18 ENCOUNTER — Emergency Department (HOSPITAL_COMMUNITY)
Admission: EM | Admit: 2016-07-18 | Discharge: 2016-07-18 | Disposition: A | Discharge status: Home / Self Care | Payer: Self-pay | Attending: Emergency Medicine | Admitting: Emergency Medicine

## 2016-07-18 DIAGNOSIS — Z87891 Personal history of nicotine dependence: Secondary | ICD-10-CM | POA: Insufficient documentation

## 2016-07-18 DIAGNOSIS — I10 Essential (primary) hypertension: Secondary | ICD-10-CM | POA: Insufficient documentation

## 2016-07-18 DIAGNOSIS — R1031 Right lower quadrant pain: Secondary | ICD-10-CM

## 2016-07-18 LAB — COMPREHENSIVE METABOLIC PANEL
ALT: 20 U/L (ref 17–63)
AST: 28 U/L (ref 15–41)
Albumin: 4.8 g/dL (ref 3.5–5.0)
Alkaline Phosphatase: 67 U/L (ref 38–126)
Anion gap: 11 (ref 5–15)
BUN: 12 mg/dL (ref 6–20)
CO2: 27 mmol/L (ref 22–32)
Calcium: 9.5 mg/dL (ref 8.9–10.3)
Chloride: 99 mmol/L — ABNORMAL LOW (ref 101–111)
Creatinine, Ser: 1.06 mg/dL (ref 0.61–1.24)
GFR calc Af Amer: >60 mL/min (ref >60)
GFR calc non Af Amer: >60 mL/min (ref >60)
Glucose, Bld: 141 mg/dL — ABNORMAL HIGH (ref 65–99)
Potassium: 3.3 mmol/L — ABNORMAL LOW (ref 3.5–5.1)
Sodium: 137 mmol/L (ref 135–145)
Total Bilirubin: 2.9 mg/dL — ABNORMAL HIGH (ref 0.3–1.2)
Total Protein: 8.2 g/dL — ABNORMAL HIGH (ref 6.5–8.1)

## 2016-07-18 LAB — CBC WITH DIFFERENTIAL/PLATELET
Basophils Absolute: 0 10*3/uL (ref 0.0–0.1)
Basophils Relative: 0 %
Eosinophils Absolute: 0 10*3/uL (ref 0.0–0.7)
Eosinophils Relative: 0 %
HCT: 39.4 % (ref 39.0–52.0)
Hemoglobin: 13.5 g/dL (ref 13.0–17.0)
Lymphocytes Relative: 7 %
Lymphs Abs: 0.6 10*3/uL — ABNORMAL LOW (ref 0.7–4.0)
MCH: 31.4 pg (ref 26.0–34.0)
MCHC: 34.3 g/dL (ref 30.0–36.0)
MCV: 91.6 fL (ref 78.0–100.0)
Monocytes Absolute: 0.2 10*3/uL (ref 0.1–1.0)
Monocytes Relative: 2 %
Neutro Abs: 7.9 10*3/uL — ABNORMAL HIGH (ref 1.7–7.7)
Neutrophils Relative %: 91 %
Platelets: 255 10*3/uL (ref 150–400)
RBC: 4.3 MIL/uL (ref 4.22–5.81)
RDW: 12.4 % (ref 11.5–15.5)
WBC: 8.7 10*3/uL (ref 4.0–10.5)

## 2016-07-18 LAB — LIPASE, BLOOD: Lipase: 14 U/L (ref 11–51)

## 2016-07-18 MED ORDER — METOCLOPRAMIDE HCL 5 MG/ML IJ SOLN
10.0000 mg | Freq: Once | INTRAMUSCULAR | Status: AC
  Administered 2016-07-18: 10 mg via INTRAVENOUS
  Filled 2016-07-18: qty 2

## 2016-07-18 MED ORDER — GI COCKTAIL ~~LOC~~
30.0000 mL | Freq: Once | ORAL | Status: AC
  Administered 2016-07-18: 30 mL via ORAL
  Filled 2016-07-18: qty 30

## 2016-07-18 NOTE — ED Notes (Signed)
Patient understood discharge instructions.  He is A & O x4.  

## 2016-07-18 NOTE — ED Notes (Signed)
Unable to collect labs patient is not in the room 

## 2016-07-18 NOTE — ED Triage Notes (Signed)
Per EMS: Pt states that he was seen here 2 days ago for same RLQ pain and vomiting.  Has not gotten rx filled because he cannot afford them.

## 2016-07-18 NOTE — Discharge Instructions (Signed)
Thank you for visiting ED today. Please contact [facility] at [phone] to make an appointment for your belly pain. Please take your blood pressure medicine regularly. You were given before a prescription for Reglan, and Prilosec, take your medicines as directed.

## 2016-07-18 NOTE — ED Provider Notes (Signed)
Emergency Department Provider Note   I have reviewed the triage vital signs and the nursing notes.   HISTORY  Chief Complaint Abdominal Pain   [name] is a 35 y.o. male past medical history significant for hypertension , chronic marijuana dependency came to the ED. Complained of worsening abdominal pain associated nausea and vomiting and one episode of diarrhea since this morning. Patient seen multiple times in ED with the same complaint, his last visit was 2 days ago. He had CT abdomen done twice this year, which was normal. He states that he comes to ED visit his abdominal pain, get some pain medicine which relieves his pain.    Past Medical History:  Diagnosis Date  . 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 Chronic abdominal pain   . GASTRITIS 12/09/2009   Qualifier: Diagnosis of  By: Daphine DeutscherMartin FNP, Zena AmosNykedtra    . Gastroparesis   . HELICOBACTER PYLORI INFECTION 12/14/2009   Qualifier: Diagnosis of  By: Levon Hedgerraddock, Brenda    . Nausea and vomiting    chronic, recurrent  . Polysubstance abuse     Patient Active Problem List   Diagnosis Date Noted  . Uncontrollable vomiting 03/10/2016  . Anemia 03/10/2016  . Metatarsal fracture 12/22/2015  . Essential hypertension 12/22/2015  . Night sweats 01/21/2015  . GERD (gastroesophageal reflux disease) 01/21/2015  . Gastritis 12/19/2014  . Hyperbilirubinemia 12/19/2014  . Elevated bilirubin   . Nausea & vomiting 12/14/2014  . Severe tetrahydrocannabinol (THC) dependence (HCC) 12/14/2014  . Alcohol abuse 12/14/2014  . Cyclic vomiting syndrome 12/06/2014  . 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 Gastroparesis 05/11/2012  . Abdominal pain 05/06/2012  . Polysubstance abuse 05/06/2012  . CHOLECYSTECTOMY, HX OF 11/25/2009    Past Surgical History:  Procedure Laterality Date  . CHOLECYSTECTOMY  11/2009    Current Outpatient Rx  .  Order #: [id number] Class: Normal  . Order #: [id number] Class: Print  . Order #: [id number] Class: Normal  . Order #: [id number] Class: Print    Allergies Patient has no known allergies.  Family History  Problem Relation Age of Onset  . Other Father   . Diabetes Father   . Hypertension Father     Social History Social History  Substance Use Topics  . Smoking status: Former Smoker    Years: 8.00    Types: Cigars    Quit date: 12/15/2014  . Smokeless tobacco: Never Used  . Alcohol use No     Comment: 12/16/2014 " I no Longer drink ,I quit about 2 years ago "    Review of Systems Constitutional: No fever/chills Eyes: No visual changes. ENT: No sore throat. Cardiovascular: Denies chest pain. Respiratory: Denies shortness of breath. Gastrointestinal: abdominal pain.  nausea,  vomiting.   diarrhea.  No constipation. Genitourinary: Negative for dysuria. Musculoskeletal: Negative for back pain. Skin: Negative for rash. Neurological: Negative for headaches, focal weakness or numbness. 10-point ROS otherwise negative.  ____________________________________________   PHYSICAL EXAM:  VITAL SIGNS: ED Triage Vitals  Enc Vitals Group     BP 07/18/16 1153 194/91     Pulse Rate 07/18/16 1153 67     Resp 07/18/16 1153 18     Temp 07/18/16 1153 98 F (36.7 C)     Temp Source 07/18/16 1153 Oral     SpO2 07/18/16 1153 94 %     Weight --      Height --  Head Circumference --      Peak Flow --      Pain Score 07/18/16 1156 10     Pain Loc --      Pain Edu? --      Excl. in GC? --    Constitutional: Alert and oriented. Well appearing and in no acute distress. Eyes: Conjunctivae are normal. PERRL. EOMI. Head: Atraumatic. Nose: No congestion/rhinnorhea. Mouth/Throat: Mucous membranes are moist.  Oropharynx non-erythematous. Neck: No stridor.  No meningeal signs.  Cardiovascular: Normal rate, regular rhythm. Good peripheral circulation. Grossly normal heart sounds.     Respiratory: Normal respiratory effort.  No retractions. Lungs CTAB. Gastrointestinal: Diffuse abdominal tenderness, more on right abdomen including the right upper and lower quadrants, subjective guarding, no rebound. Bowel sounds positive. Musculoskeletal: No lower extremity tenderness nor edema. No gross deformities of extremities. Neurologic:  Normal speech and language. No gross focal neurologic deficits are appreciated.  Skin:  Skin is warm, dry and intact. No rash noted. ____________________________________________   LABS (all labs ordered are listed, but only abnormal results are displayed)  Labs Reviewed  COMPREHENSIVE METABOLIC PANEL - Abnormal; Notable for the following:       Result Value   Potassium 3.3 (*)    Chloride 99 (*)    Glucose, Bld 141 (*)    Total Protein 8.2 (*)    Total Bilirubin 2.9 (*)    All other components within normal limits  CBC WITH DIFFERENTIAL/PLATELET - Abnormal; Notable for the following:    Neutro Abs 7.9 (*)    Lymphs Abs 0.6 (*)    All other components within normal limits  LIPASE, BLOOD   ____________________________________________  EKG   ____________________________________________  RADIOLOGY  Dg Abdomen 1 View  Result Date: 07/18/2016 CLINICAL DATA:  Abdominal pain, nausea and vomiting x2 days EXAM: ABDOMEN - 1 VIEW COMPARISON:  CT from 02/19/2016, 12/13/2015 and 05/11/2012 radiographs FINDINGS: The bowel gas pattern is normal. Cholecystectomy clips noted in the right upper quadrant. No acute osseous abnormality. No free air is identified. No radio-opaque calculi or other significant radiographic abnormality are seen. IMPRESSION: Negative. Electronically Signed   By: Tollie Eth M.D.   On: 07/18/2016 13:34    ____________________________________________   PROCEDURES  Procedure(s) performed:   Procedures   ____________________________________________   INITIAL IMPRESSION / ASSESSMENT AND PLAN / ED COURSE  Pertinent  labs & imaging results that were available during my care of the patient were reviewed by me and considered in my medical decision making (see chart for details).  Abdominal pain. Most probably due to his chronic marijuana abuse. Abdominal migraine can be a possibility. He has already in 2 weeks CT abdomen done during  last 6 months which were normal. As I don't think he states he is in need for another CT at this time.  We will do abdominal x-ray and check CMP.  He we will try Reglan and GI cocktail, reevaluate after that.  3:30PM patient home to be comfortably sleeping, when woke him up stating that he is having same amount of pain. His abdominal x-rays normal, CBC is without any leukocytosis, CMP show gradual increase in bilirubin, it was 2.9 today as compared to 1.92 days ago.  He is being discharged, we asked him to consult regarding gastroenterology for further evaluation of his abdominal pain and worsening hyperbilirubinemia.   ____________________________________________  FINAL CLINICAL IMPRESSION(S) / ED DIAGNOSES  Final diagnoses:  None     MEDICATIONS GIVEN DURING THIS VISIT:  Medications  metoCLOPramide (REGLAN) injection 10 mg (10 mg Intravenous Given 07/18/16 1351)  gi cocktail (Maalox,Lidocaine,Donnatal) (30 mLs Oral Given 07/18/16 1351)     NEW OUTPATIENT MEDICATIONS STARTED DURING THIS VISIT:  New Prescriptions   No medications on file      Note:  This document was prepared using Dragon voice recognition software and may include unintentional dictation errors.  Alona BeneJoshua Long, MD Emergency Medicine   Arnetha CourserSumayya Jakayden Cancio, MD 07/18/16 1619    Melene Planan Floyd, DO 07/19/16 [phone]

## 2016-07-20 ENCOUNTER — Encounter: Payer: Self-pay | Admitting: Family Medicine

## 2016-07-20 ENCOUNTER — Ambulatory Visit: Payer: Self-pay | Attending: Family Medicine | Admitting: Family Medicine

## 2016-07-20 VITALS — BP 144/111 | HR 106 | Temp 98.7°F

## 2016-07-20 DIAGNOSIS — G8929 Other chronic pain: Secondary | ICD-10-CM | POA: Insufficient documentation

## 2016-07-20 DIAGNOSIS — K3184 Gastroparesis: Secondary | ICD-10-CM | POA: Insufficient documentation

## 2016-07-20 DIAGNOSIS — G43A0 Cyclical vomiting, not intractable: Secondary | ICD-10-CM | POA: Insufficient documentation

## 2016-07-20 DIAGNOSIS — F1210 Cannabis abuse, uncomplicated: Secondary | ICD-10-CM | POA: Insufficient documentation

## 2016-07-20 DIAGNOSIS — K2970 Gastritis, unspecified, without bleeding: Secondary | ICD-10-CM | POA: Insufficient documentation

## 2016-07-20 DIAGNOSIS — F1910 Other psychoactive substance abuse, uncomplicated: Secondary | ICD-10-CM | POA: Insufficient documentation

## 2016-07-20 DIAGNOSIS — K828 Other specified diseases of gallbladder: Secondary | ICD-10-CM | POA: Insufficient documentation

## 2016-07-20 MED ORDER — ONDANSETRON 4 MG PO TBDP
4.0000 mg | ORAL_TABLET | Freq: Once | ORAL | Status: AC
  Administered 2016-07-20: 4 mg via ORAL

## 2016-07-20 MED FILL — ONDANSETRON HCL 8 MG TABLET: 8 | 7 days supply | Qty: 20 | Fill #0

## 2016-07-20 NOTE — Progress Notes (Signed)
Subjective:  Patient ID: Matthew Scott, male    DOB: 11/13/1980  Age: 35 y.o. MRN: [id number]  CC: Vomiting and abdominal pain  [name] is a 35 year old male with a history of cannabis abuse, cyclical vomiting syndrome with multiple ED presentations for nausea and vomiting who came in to the pharmacy to obtain his prescriptions which he had run out of and was noticed to be nauseated and vomiting. Symptoms have been ongoing for the last 24 hours with inability to keep anything down and associated abdominal pain, chills but no fever. States he last used marijuana several months ago. He was last seen in the clinic several months ago.   Past Medical History:  Diagnosis Date  . 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 Chronic abdominal pain   . GASTRITIS 12/09/2009   Qualifier: Diagnosis of  By: Daphine DeutscherMartin FNP, Zena AmosNykedtra    . Gastroparesis   . HELICOBACTER PYLORI INFECTION 12/14/2009   Qualifier: Diagnosis of  By: Levon Hedgerraddock, Brenda    . Nausea and vomiting    chronic, recurrent  . Polysubstance abuse     Past Surgical History:  Procedure Laterality Date  . CHOLECYSTECTOMY  11/2009    No Known Allergies   Outpatient Medications Prior to Visit  Medication Sig Dispense Refill  . lisinopril (PRINIVIL,ZESTRIL) 5 MG tablet Take 1 tablet (5 mg total) by mouth daily. 90 tablet 3  . metoCLOPramide (REGLAN) 10 MG tablet Take 1 tablet (10 mg total) by mouth every 6 (six) hours. 20 tablet 0  . omeprazole (PRILOSEC) 20 MG capsule TAKE 1 CAPSULE BY MOUTH DAILY. 30 capsule 0  . ondansetron (ZOFRAN) 8 MG tablet Take 1 tablet (8 mg total) by mouth every 8 (eight) hours as needed for nausea or vomiting. 20 tablet 0   No facility-administered medications prior to visit.     ROS Review of Systems  Constitutional: Negative for activity change and appetite change.  HENT: Negative for sinus pressure and sore throat.   Respiratory: Negative for  chest tightness, shortness of breath and wheezing.   Cardiovascular: Negative for chest pain and palpitations.  Gastrointestinal: Positive for abdominal pain, nausea and vomiting. Negative for abdominal distention and constipation.  Genitourinary: Negative.   Musculoskeletal: Negative.   Psychiatric/Behavioral: Negative for behavioral problems and dysphoric mood.    Objective:  BP (!) 144/111 (BP Location: Left Arm, Patient Position: Sitting, Cuff Size: Large)   Pulse (!) 106   Temp 98.7 F (37.1 C) (Oral)   SpO2 97%   BP/Weight 07/20/2016 07/18/2016 07/16/2016  Systolic BP 144 169 171  Diastolic BP 111 99 98  Wt. (Lbs) - - -  BMI - - -      Physical Exam  Constitutional: He is oriented to person, place, and time.  Ill-looking  Cardiovascular: Normal heart sounds and intact distal pulses.  Tachycardia present.   No murmur heard. Pulmonary/Chest: Effort normal and breath sounds normal. He has no wheezes. He has no rales. He exhibits no tenderness.  Abdominal: Soft. Bowel sounds are normal. He exhibits no distension and no mass. There is tenderness.  Musculoskeletal: Normal range of motion.  Neurological: He is alert and oriented to person, place, and time.     Assessment & Plan:   1. Cyclical vomiting with nausea, intractability of vomiting not specified Zofran administered and patient observed for 30 mins by which time symptoms had subsided  Patient ran out of medications hence  exacerbation of symptoms Advised to pick up medications and we'll see him back for follow-up - ondansetron (ZOFRAN-ODT) disintegrating tablet 4 mg; Take 1 tablet (4 mg total) by mouth once.   Meds ordered this encounter  Medications  . ondansetron (ZOFRAN-ODT) disintegrating tablet 4 mg    Follow-up: 3 weeks for follow-up of the above   Jaclyn ShaggyEnobong Amao MD

## 2016-08-10 ENCOUNTER — Encounter: Payer: Self-pay | Admitting: Family Medicine

## 2016-08-10 ENCOUNTER — Ambulatory Visit: Payer: Self-pay | Attending: Family Medicine | Admitting: Family Medicine

## 2016-08-10 DIAGNOSIS — G43A0 Cyclical vomiting, not intractable: Secondary | ICD-10-CM | POA: Insufficient documentation

## 2016-08-10 DIAGNOSIS — R109 Unspecified abdominal pain: Secondary | ICD-10-CM | POA: Insufficient documentation

## 2016-08-10 DIAGNOSIS — K089 Disorder of teeth and supporting structures, unspecified: Secondary | ICD-10-CM

## 2016-08-10 DIAGNOSIS — K3184 Gastroparesis: Secondary | ICD-10-CM | POA: Insufficient documentation

## 2016-08-10 DIAGNOSIS — F1210 Cannabis abuse, uncomplicated: Secondary | ICD-10-CM | POA: Insufficient documentation

## 2016-08-10 DIAGNOSIS — Z9049 Acquired absence of other specified parts of digestive tract: Secondary | ICD-10-CM | POA: Insufficient documentation

## 2016-08-10 DIAGNOSIS — G8929 Other chronic pain: Secondary | ICD-10-CM | POA: Insufficient documentation

## 2016-08-10 DIAGNOSIS — R1115 Cyclical vomiting syndrome unrelated to migraine: Secondary | ICD-10-CM

## 2016-08-10 DIAGNOSIS — I10 Essential (primary) hypertension: Secondary | ICD-10-CM | POA: Insufficient documentation

## 2016-08-10 DIAGNOSIS — F1910 Other psychoactive substance abuse, uncomplicated: Secondary | ICD-10-CM | POA: Insufficient documentation

## 2016-08-10 DIAGNOSIS — K219 Gastro-esophageal reflux disease without esophagitis: Secondary | ICD-10-CM | POA: Insufficient documentation

## 2016-08-10 LAB — COMPLETE METABOLIC PANEL WITH GFR
ALT: 15 U/L (ref 9–46)
AST: 14 U/L (ref 10–40)
Albumin: 3.9 g/dL (ref 3.6–5.1)
Alkaline Phosphatase: 68 U/L (ref 40–115)
BUN: 10 mg/dL (ref 7–25)
CO2: 28 mmol/L (ref 20–31)
Calcium: 9.3 mg/dL (ref 8.6–10.3)
Chloride: 105 mmol/L (ref 98–110)
Creat: 0.92 mg/dL (ref 0.60–1.35)
GFR, Est African American: >89 mL/min (ref >=60)
GFR, Est Non African American: >89 mL/min (ref >=60)
Glucose, Bld: 89 mg/dL (ref 65–99)
Potassium: 4.5 mmol/L (ref 3.5–5.3)
Sodium: 141 mmol/L (ref 135–146)
Total Bilirubin: 0.9 mg/dL (ref 0.2–1.2)
Total Protein: 6.5 g/dL (ref 6.1–8.1)

## 2016-08-10 MED ORDER — LISINOPRIL 5 MG PO TABS: 5.0000 mg | ORAL_TABLET | Freq: Every day | ORAL | 3 refills | Status: DC

## 2016-08-10 MED ORDER — ONDANSETRON HCL 8 MG PO TABS: 8.0000 mg | ORAL_TABLET | Freq: Three times a day (TID) | ORAL | 2 refills | Status: DC | PRN

## 2016-08-10 MED ORDER — OMEPRAZOLE 20 MG PO CPDR: 20.0000 mg | DELAYED_RELEASE_CAPSULE | Freq: Every day | ORAL | 3 refills | Status: DC

## 2016-08-10 MED ORDER — AMOXICILLIN 500 MG PO CAPS: 500.0000 mg | ORAL_CAPSULE | Freq: Three times a day (TID) | ORAL | 0 refills | Status: DC

## 2016-08-10 MED FILL — ?OMEPRAZOLE DR 20 MG CAPSUL: 20 | 30 days supply | Qty: 30 | Fill #0

## 2016-08-10 MED FILL — AMOXICILLIN 500 MG CAPSULE: 500 | 10 days supply | Qty: 30 | Fill #0

## 2016-08-10 MED FILL — ONDANSETRON HCL 8 MG TABLET: 8 | 20 days supply | Qty: 60 | Fill #0

## 2016-08-10 MED FILL — LISINOPRIL 5 MG TABLET: 5 | 30 days supply | Qty: 30 | Fill #0

## 2016-08-10 NOTE — Patient Instructions (Signed)
Dental Pain Dental pain may be caused by many things, including:  Tooth decay (cavities or caries). Cavities expose the nerve of your tooth to air and hot or cold temperatures. This can cause pain or discomfort.  Abscess or infection. A dental abscess is a collection of infected pus from a bacterial infection in the inner part of the tooth (pulp). It usually occurs at the end of the tooth's root.  Injury.  An unknown reason (idiopathic). Your pain may be mild or severe. It may only occur when:  You are chewing.  You are exposed to hot or cold temperature.  You are eating or drinking sugary foods or beverages, such as soda or candy. Your pain may also be constant. Follow these instructions at home: Watch your dental pain for any changes. The following actions may help to lessen any discomfort that you are feeling:  Take medicines only as directed by your dentist.  If you were prescribed an antibiotic medicine, finish all of it even if you start to feel better.  Keep all follow-up visits as directed by your dentist. This is important.  Do not apply heat to the outside of your face.  Rinse your mouth or gargle with salt water if directed by your dentist. This helps with pain and swelling.  You can make salt water by adding  tsp of salt to 1 cup of warm water.  Apply ice to the painful area of your face:  Put ice in a plastic bag.  Place a towel between your skin and the bag.  Leave the ice on for 20 minutes, 2-3 times per day.  Avoid foods or drinks that cause you pain, such as:  Very hot or very cold foods or drinks.  Sweet or sugary foods or drinks. Contact a health care provider if:  Your pain is not controlled with medicines.  Your symptoms are worse.  You have new symptoms. Get help right away if:  You are unable to open your mouth.  You are having trouble breathing or swallowing.  You have a fever.  Your face, neck, or jaw is swollen. This information  is not intended to replace advice given to you by your health care provider. Make sure you discuss any questions you have with your health care provider. Document Released: 08/01/2005 Document Revised: 12/10/2015 Document Reviewed: 07/28/2014 Elsevier Interactive Patient Education  2017 [employer]   

## 2016-08-10 NOTE — Progress Notes (Signed)
Subjective:  Patient ID: Matthew Scott, male    DOB: 08/23/1980  Age: 35 y.o. MRN: [id number]  CC: Hypertension; marijuana abuse (last time christmas); and Emesis (last time two weeks ago)   [name] with a history of cannabis abuse, cyclical vomiting syndrome with multiple ED presentations for nausea and vomiting, hypertension who presents today for a follow-up visit.  He is requesting refills of his medications and denies vomiting or abdominal pain but endorses early morning nausea which results later in the day. He endorses late night snacking. Has also been using cannabis with his last marijuana use 2 days ago.  Has been compliant with his antihypertensives and tolerates the medication well.  He complains of toothache in his left lower jaw for one day and pain is described as throbbing. He has not seen a dentist in 9 years.  Past Medical History:  Diagnosis Date  . 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 Chronic abdominal pain   . GASTRITIS 12/09/2009   Qualifier: Diagnosis of  By: Daphine DeutscherMartin FNP, Zena AmosNykedtra    . Gastroparesis   . HELICOBACTER PYLORI INFECTION 12/14/2009   Qualifier: Diagnosis of  By: Levon Hedgerraddock, Brenda    . Nausea and vomiting    chronic, recurrent  . Polysubstance abuse     Past Surgical History:  Procedure Laterality Date  . CHOLECYSTECTOMY  11/2009    No Known Allergies   Outpatient Medications Prior to Visit  Medication Sig Dispense Refill  . lisinopril (PRINIVIL,ZESTRIL) 5 MG tablet Take 1 tablet (5 mg total) by mouth daily. 90 tablet 3  . omeprazole (PRILOSEC) 20 MG capsule TAKE 1 CAPSULE BY MOUTH DAILY. 30 capsule 0  . ondansetron (ZOFRAN) 8 MG tablet Take 1 tablet (8 mg total) by mouth every 8 (eight) hours as needed for nausea or vomiting. 20 tablet 0  . metoCLOPramide (REGLAN) 10 MG tablet Take 1 tablet (10 mg total) by mouth every 6 (six) hours. (Patient not taking: Reported on 08/10/2016)  20 tablet 0   No facility-administered medications prior to visit.     ROS Review of Systems  Constitutional: Negative for activity change and appetite change.  HENT: Negative for sinus pressure and sore throat.   Eyes: Negative for visual disturbance.  Respiratory: Negative for cough, chest tightness and shortness of breath.   Cardiovascular: Negative for chest pain and leg swelling.  Gastrointestinal: Positive for nausea. Negative for abdominal distention, abdominal pain, constipation and diarrhea.  Endocrine: Negative.   Genitourinary: Negative for dysuria.  Musculoskeletal: Negative for joint swelling and myalgias.  Skin: Negative for rash.  Allergic/Immunologic: Negative.   Neurological: Negative for weakness, light-headedness and numbness.  Psychiatric/Behavioral: Negative for dysphoric mood and suicidal ideas.    Objective:  BP 116/70 (BP Location: Right Arm, Patient Position: Sitting, Cuff Size: Large)   Pulse 67   Temp 98.9 F (37.2 C) (Oral)   Ht 5\' 10"  (1.778 m)   Wt 202 lb 3.2 oz (91.7 kg)   SpO2 99%   BMI 29.01 kg/m   BP/Weight 08/10/2016 07/20/2016 07/18/2016  Systolic BP 116 144 169  Diastolic BP 70 111 99  Wt. (Lbs) 202.2 - -  BMI 29.01 - -      Physical Exam  Constitutional: He is oriented to person, place, and time. He appears well-developed and well-nourished.  HENT:  Left lower molar with dental caries, no surrounding gum erythema  Neck: No JVD present.  Cardiovascular:  Normal rate, normal heart sounds and intact distal pulses.   No murmur heard. Pulmonary/Chest: Effort normal and breath sounds normal. He has no wheezes. He has no rales. He exhibits no tenderness.  Abdominal: Soft. Bowel sounds are normal. He exhibits no distension and no mass. There is no tenderness.  Musculoskeletal: Normal range of motion.  Neurological: He is alert and oriented to person, place, and time.  Skin: Skin is warm and dry.  Psychiatric: He has a normal mood and  affect.     CMP Latest Ref Rng & Units 07/18/2016 07/16/2016 07/14/2016  Glucose 65 - 99 mg/dL 657(Q141(H) 469(G157(H) 295(M173(H)  BUN 6 - 20 mg/dL 12 13 13   Creatinine 0.61 - 1.24 mg/dL 8.411.06 3.241.05 4.011.10  Sodium 135 - 145 mmol/L 137 139 141  Potassium 3.5 - 5.1 mmol/L 3.3(L) 3.4(L) 3.0(L)  Chloride 101 - 111 mmol/L 99(L) 105 104  CO2 22 - 32 mmol/L 27 22 23   Calcium 8.9 - 10.3 mg/dL 9.5 9.7 02.710.0  Total Protein 6.5 - 8.1 g/dL 8.2(H) 7.8 8.0  Total Bilirubin 0.3 - 1.2 mg/dL 2.9(H) 1.9(H) 1.5(H)  Alkaline Phos 38 - 126 U/L 67 63 78  AST 15 - 41 U/L 28 33 28  ALT 17 - 63 U/L 20 21 22     Assessment & Plan:   1. Hyperbilirubinemia Bilirubin trending up Unknown etiology We'll refer to GI if persisting - COMPLETE METABOLIC PANEL WITH GFR  2. Essential hypertension Controlled - lisinopril (PRINIVIL,ZESTRIL) 5 MG tablet; Take 1 tablet (5 mg total) by mouth daily.  Dispense: 30 tablet; Refill: 3  3. Non-intractable cyclical vomiting with nausea Stable Advised against cannabis use as this could be the precipitating agent - ondansetron (ZOFRAN) 8 MG tablet; Take 1 tablet (8 mg total) by mouth every 8 (eight) hours as needed for nausea or vomiting.  Dispense: 60 tablet; Refill: 2  4. Dental disorder He will need to be referred to a dentist Advised to apply for the [facility] card to facilitate this process - amoxicillin (AMOXIL) 500 MG capsule; Take 1 capsule (500 mg total) by mouth 3 (three) times daily.  Dispense: 30 capsule; Refill: 0  5. Gastroesophageal reflux disease without esophagitis Advised against late-night meals and to maintain recumbent position 2 hours after meals - omeprazole (PRILOSEC) 20 MG capsule; Take 1 capsule (20 mg total) by mouth daily.  Dispense: 30 capsule; Refill: 3   Meds ordered this encounter  Medications  . ondansetron (ZOFRAN) 8 MG tablet    Sig: Take 1 tablet (8 mg total) by mouth every 8 (eight) hours as needed for nausea or vomiting.    Dispense:  60 tablet     Refill:  2  . omeprazole (PRILOSEC) 20 MG capsule    Sig: Take 1 capsule (20 mg total) by mouth daily.    Dispense:  30 capsule    Refill:  3  . lisinopril (PRINIVIL,ZESTRIL) 5 MG tablet    Sig: Take 1 tablet (5 mg total) by mouth daily.    Dispense:  30 tablet    Refill:  3  . amoxicillin (AMOXIL) 500 MG capsule    Sig: Take 1 capsule (500 mg total) by mouth 3 (three) times daily.    Dispense:  30 capsule    Refill:  0    Follow-up: Return in about 3 months (around 11/08/2016) for Follow-up on hypertension.   Jaclyn ShaggyEnobong Amao MD

## 2016-08-10 NOTE — Progress Notes (Signed)
Needs refills on zofran A bit nauseated every morning

## 2016-08-11 ENCOUNTER — Telehealth: Payer: Self-pay

## 2016-08-11 NOTE — Telephone Encounter (Signed)
Writer called patient per Dr. Venetia NightAmao to discuss lab results.  Patient was awakened by the phone call and stated that he wasn't feeling well.  Writer gave him the results which patient stated an understanding .

## 2016-08-11 NOTE — Telephone Encounter (Signed)
-----   Message from Jaclyn ShaggyEnobong Amao, MD sent at 08/11/2016  9:38 AM EST ----- Please inform the patient that labs are normal. Thank you.

## 2016-09-26 ENCOUNTER — Other Ambulatory Visit: Payer: Self-pay | Admitting: Family Medicine

## 2016-09-26 DIAGNOSIS — K089 Disorder of teeth and supporting structures, unspecified: Secondary | ICD-10-CM

## 2016-10-21 MED FILL — OMEPRAZOLE DR 20 MG CAPSULE: 20 | 30 days supply | Qty: 30 | Fill #1

## 2016-10-21 MED FILL — LISINOPRIL 5 MG TAB: 5 | 30 days supply | Qty: 30 | Fill #1

## 2016-10-21 MED FILL — ONDANSETRON HCL 8 MG TABLET: 8 | 20 days supply | Qty: 60 | Fill #1

## 2016-10-28 ENCOUNTER — Encounter (HOSPITAL_COMMUNITY): Payer: Self-pay | Admitting: Emergency Medicine

## 2016-10-28 ENCOUNTER — Emergency Department (HOSPITAL_COMMUNITY): Payer: Self-pay

## 2016-10-28 ENCOUNTER — Emergency Department (HOSPITAL_COMMUNITY)
Admission: EM | Admit: 2016-10-28 | Discharge: 2016-10-28 | Disposition: A | Discharge status: Home / Self Care | Payer: Self-pay | Attending: Emergency Medicine | Admitting: Emergency Medicine

## 2016-10-28 DIAGNOSIS — Z87891 Personal history of nicotine dependence: Secondary | ICD-10-CM | POA: Insufficient documentation

## 2016-10-28 DIAGNOSIS — I10 Essential (primary) hypertension: Secondary | ICD-10-CM | POA: Insufficient documentation

## 2016-10-28 DIAGNOSIS — Z79899 Other long term (current) drug therapy: Secondary | ICD-10-CM | POA: Insufficient documentation

## 2016-10-28 DIAGNOSIS — R112 Nausea with vomiting, unspecified: Secondary | ICD-10-CM | POA: Insufficient documentation

## 2016-10-28 LAB — COMPREHENSIVE METABOLIC PANEL
ALT: 27 U/L (ref 17–63)
AST: 46 U/L — ABNORMAL HIGH (ref 15–41)
Albumin: 5.2 g/dL — ABNORMAL HIGH (ref 3.5–5.0)
Alkaline Phosphatase: 73 U/L (ref 38–126)
Anion gap: 14 (ref 5–15)
BUN: 17 mg/dL (ref 6–20)
CO2: 22 mmol/L (ref 22–32)
Calcium: 10.1 mg/dL (ref 8.9–10.3)
Chloride: 101 mmol/L (ref 101–111)
Creatinine, Ser: 1.35 mg/dL — ABNORMAL HIGH (ref 0.61–1.24)
GFR calc Af Amer: >60 mL/min (ref >60)
GFR calc non Af Amer: >60 mL/min (ref >60)
Glucose, Bld: 189 mg/dL — ABNORMAL HIGH (ref 65–99)
Potassium: 3.2 mmol/L — ABNORMAL LOW (ref 3.5–5.1)
Sodium: 137 mmol/L (ref 135–145)
Total Bilirubin: 2.2 mg/dL — ABNORMAL HIGH (ref 0.3–1.2)
Total Protein: 8.6 g/dL — ABNORMAL HIGH (ref 6.5–8.1)

## 2016-10-28 LAB — CBC
HCT: 39.5 % (ref 39.0–52.0)
Hemoglobin: 13.8 g/dL (ref 13.0–17.0)
MCH: 31.7 pg (ref 26.0–34.0)
MCHC: 34.9 g/dL (ref 30.0–36.0)
MCV: 90.8 fL (ref 78.0–100.0)
Platelets: 283 10*3/uL (ref 150–400)
RBC: 4.35 MIL/uL (ref 4.22–5.81)
RDW: 11.8 % (ref 11.5–15.5)
WBC: 12.5 10*3/uL — ABNORMAL HIGH (ref 4.0–10.5)

## 2016-10-28 LAB — RAPID URINE DRUG SCREEN, HOSP PERFORMED
Amphetamines: NOT DETECTED
Barbiturates: NOT DETECTED
Benzodiazepines: NOT DETECTED
Cocaine: NOT DETECTED
Opiates: NOT DETECTED
Tetrahydrocannabinol: POSITIVE — AB

## 2016-10-28 LAB — LIPASE, BLOOD: Lipase: 14 U/L (ref 11–51)

## 2016-10-28 MED ORDER — PROMETHAZINE HCL 25 MG PO TABS: 25.0000 mg | ORAL_TABLET | Freq: Four times a day (QID) | ORAL | 0 refills | Status: DC | PRN

## 2016-10-28 MED ORDER — HALOPERIDOL LACTATE 5 MG/ML IJ SOLN
2.0000 mg | Freq: Once | INTRAMUSCULAR | Status: AC
  Administered 2016-10-28: 2 mg via INTRAVENOUS
  Filled 2016-10-28: qty 1

## 2016-10-28 MED ORDER — ONDANSETRON HCL 4 MG/2ML IJ SOLN
4.0000 mg | Freq: Once | INTRAMUSCULAR | Status: AC
  Administered 2016-10-28: 4 mg via INTRAVENOUS
  Filled 2016-10-28: qty 2

## 2016-10-28 MED ORDER — GI COCKTAIL ~~LOC~~
30.0000 mL | Freq: Once | ORAL | Status: AC
  Administered 2016-10-28: 30 mL via ORAL
  Filled 2016-10-28: qty 30

## 2016-10-28 MED ORDER — SODIUM CHLORIDE 0.9 % IV BOLUS (SEPSIS)
1000.0000 mL | Freq: Once | INTRAVENOUS | Status: AC
  Administered 2016-10-28: 1000 mL via INTRAVENOUS

## 2016-10-28 NOTE — ED Notes (Signed)
Bed: [id number] Expected date: 10/28/16 Expected time: 2:21 PM Means of arrival: Ambulance Comments: Fever and chills

## 2016-10-28 NOTE — ED Provider Notes (Signed)
[facility]-EMERGENCY DEPT Provider Note   CSN: [id number] Arrival date & time: 10/28/16  1424     History   Chief Complaint Chief Complaint  Patient presents with  . Abdominal Pain    [name] is a 36 y.o. male.  [name] is a 36 y.o. male with history of biliary dyskinesia, gastritis, H. pylori infection, recurrent nausea and vomiting, polysubstance abuse, presents to emergency department complaining of abdominal pain, nausea, vomiting. Patient reports his symptoms started yesterday. Reports persistent vomiting, unable to keep anything down. No blood in stool or emesis. No diarrhea. Last bowel movement yesterday. Describes pain as sharp, all over. Nothing making it better or worse. Radiates over entire abdomen. No back pain. No urianry symptoms. No fever, chills. Hx of similar episodes. Pt is a frequent marijuana user, but states he hasnt smoked in last few days. No treatment prior to coming in.   Past Medical History:  Diagnosis Date  . 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 Chronic abdominal pain   . GASTRITIS 12/09/2009   Qualifier: Diagnosis of  By: Daphine Deutscher FNP, Zena Amos    . Gastroparesis   . 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 Nausea and vomiting    chronic, recurrent  . Polysubstance abuse     Patient Active Problem List   Diagnosis Date Noted  . Uncontrollable vomiting 03/10/2016  . Anemia 03/10/2016  . Metatarsal fracture 12/22/2015  . Essential hypertension 12/22/2015  . Night sweats 01/21/2015  . GERD (gastroesophageal reflux disease) 01/21/2015  . Gastritis 12/19/2014  . Hyperbilirubinemia 12/19/2014  . Elevated bilirubin   . Nausea & vomiting 12/14/2014  . Severe tetrahydrocannabinol (THC) dependence (HCC) 12/14/2014  . Alcohol abuse 12/14/2014  . Cyclic vomiting syndrome 12/06/2014  . 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 Gastroparesis 05/11/2012  . Abdominal pain 05/06/2012  . Polysubstance abuse 05/06/2012  . CHOLECYSTECTOMY, HX OF 11/25/2009    Past Surgical History:  Procedure Laterality Date  . CHOLECYSTECTOMY  11/2009       Home Medications    Prior to Admission medications   Medication Sig Start Date End Date Taking? Authorizing Provider  lisinopril (PRINIVIL,ZESTRIL) 5 MG tablet Take 1 tablet (5 mg total) by mouth daily. 08/10/16  Yes Jaclyn Shaggy, MD  metoCLOPramide (REGLAN) 10 MG tablet Take 1 tablet (10 mg total) by mouth every 6 (six) hours. 05/08/16  Yes Donnetta Hutching, MD  omeprazole (PRILOSEC) 20 MG capsule Take 1 capsule (20 mg total) by mouth daily. 08/10/16  Yes Jaclyn Shaggy, MD  ondansetron (ZOFRAN) 8 MG tablet Take 1 tablet (8 mg total) by mouth every 8 (eight) hours as needed for nausea or vomiting. 08/10/16  Yes Jaclyn Shaggy, MD  amoxicillin (AMOXIL) 500 MG capsule Take 1 capsule (500 mg total) by mouth 3 (three) times daily. Patient not taking: Reported on 10/28/2016 08/10/16   Jaclyn Shaggy, MD    Family History Family History  Problem Relation Age of Onset  . Other Father   . Diabetes Father   . Hypertension Father     Social History Social History  Substance Use Topics  . Smoking status: Former Smoker    Years: 8.00    Types: Cigars    Quit date: 12/15/2014  . Smokeless tobacco: Never Used  . Alcohol use No     Comment: 12/16/2014 " I no  Longer drink ,I quit about 2 years ago "     Allergies   Patient has no known allergies.   Review of Systems Review of Systems  Constitutional: Negative for chills and fever.  Respiratory: Negative for cough, chest tightness and shortness of breath.   Cardiovascular: Negative for chest pain, palpitations and leg swelling.  Gastrointestinal: Positive for abdominal pain, nausea and vomiting. Negative for abdominal distention, blood in stool and diarrhea.  Genitourinary: Negative for dysuria,  frequency, hematuria and urgency.  Musculoskeletal: Negative for arthralgias, myalgias, neck pain and neck stiffness.  Skin: Negative for rash.  Allergic/Immunologic: Negative for immunocompromised state.  Neurological: Negative for dizziness, weakness, light-headedness, numbness and headaches.  All other systems reviewed and are negative.    Physical Exam Updated Vital Signs BP (!) 171/105 (BP Location: Left Arm)   Pulse 97   Temp 97.4 F (36.3 C) (Oral)   Resp 20   SpO2 96%   Physical Exam  Constitutional: He is oriented to person, place, and time. He appears well-developed and well-nourished.  Appears uncomfortable. Laying on his abdomen, moaning  HENT:  Head: Normocephalic and atraumatic.  Eyes: Conjunctivae are normal.  Neck: Neck supple.  Cardiovascular: Normal rate, regular rhythm and normal heart sounds.   Pulmonary/Chest: Effort normal. No respiratory distress. He has no wheezes. He has no rales.  Abdominal: Soft. Bowel sounds are normal. He exhibits no distension. There is tenderness. There is guarding. There is no rebound.  Diffuse tenderness  Musculoskeletal: He exhibits no edema.  Neurological: He is alert and oriented to person, place, and time.  Skin: Skin is warm and dry.  Nursing note and vitals reviewed.    ED Treatments / Results  Labs (all labs ordered are listed, but only abnormal results are displayed) Labs Reviewed  LIPASE, BLOOD  COMPREHENSIVE METABOLIC PANEL  CBC  URINALYSIS, ROUTINE W REFLEX MICROSCOPIC  RAPID URINE DRUG SCREEN, HOSP PERFORMED    EKG  EKG Interpretation None       Radiology No results found.  Procedures Procedures (including critical care time)  Medications Ordered in ED Medications  haloperidol lactate (HALDOL) injection 2 mg (not administered)     Initial Impression / Assessment and Plan / ED Course  I have reviewed the triage vital signs and the nursing notes.  Pertinent labs & imaging results that were  available during my care of the patient were reviewed by me and considered in my medical decision making (see chart for details).    Pt in ED with recurrent abdominal pain and vomiting. Has had prior work up for the same, has had cholecystectomy, also has hx of Hpylori and gastritis. On todays exam, pain is diffuse with some guarding. Will get xray abd, labs, will try haldol.Prior ECG reviewed, normal QT.  Fluids running. \  4:39 PM Pt had no relief with haldol but states he is thirsty and wants something to drink. Pt is no longer moaning but still laying face down on stretcher. Will try gi cocktail and zofran. Will give more IV fluids. Creatinine slightly bumped today at 1.35.   6:47 PM Pt feels better. Tolerating ginger ale. Will dc home with his grandmother. Will give prescription for phenergan. Abdomen benign. Question cannabis cyclical vomiting. Discussed cessation of marijuana use.  VS stable. Will dc home.   Vitals:   10/28/16 1619 10/28/16 1711 10/28/16 1730 10/28/16 1959  BP: (!) 157/93 122/64 (!) 127/57 (!) 159/87  Pulse: 68  74 79  Resp: (!) 23 (!) 21 (!)  23 (!) 30  Temp:      TempSrc:      SpO2: 99%  100% 97%  Weight: 89.8 kg     Height: 5\' 9"  (1.753 m)         Final Clinical Impressions(s) / ED Diagnoses   Final diagnoses:  Non-intractable vomiting with nausea, unspecified vomiting type    New Prescriptions Discharge Medication List as of 10/28/2016  6:49 PM    START taking these medications   Details  promethazine (PHENERGAN) 25 MG tablet Take 1 tablet (25 mg total) by mouth every 6 (six) hours as needed for nausea or vomiting., Starting Fri 10/28/2016, Print         Tyshan Enderle, PA-C 10/28/16 2126    Azalia Bilis, MD 10/30/16 2131

## 2016-10-28 NOTE — ED Notes (Signed)
GINGERALE GIVEN.

## 2016-10-28 NOTE — Discharge Instructions (Signed)
Avoid smoking marijuana. Take phenergan as prescribed as needed for nausea and vomiting. Try warm baths. Follow up with family doctor as needed.

## 2016-10-28 NOTE — ED Notes (Signed)
EDPA Provider at bedside. 

## 2016-10-28 NOTE — ED Triage Notes (Signed)
Per EMS patient from home.  Patient c/o right sided abdominal pain and tenderness x 5 years. Patient had gallbladder out 5 years ago.  States that last night he had wendy's and began vomiting at 1130 last night.  States unable to keep medications down.

## 2016-11-08 ENCOUNTER — Telehealth: Payer: Self-pay | Admitting: Family Medicine

## 2016-11-08 MED ORDER — PROMETHAZINE HCL 25 MG RE SUPP: 25.0000 mg | Freq: Three times a day (TID) | RECTAL | 1 refills | Status: DC | PRN

## 2016-11-08 NOTE — Telephone Encounter (Signed)
Patient called the office asking to speak with PCP regarding getting medication, suppository to be called in to our pharmacy. Pt stated that she has been experiencing stomach pain. Please follow up.  Thank you

## 2016-11-08 NOTE — Telephone Encounter (Signed)
This is for Dr. Venetia NightAmao to determine - will forward to her.

## 2016-11-08 NOTE — Telephone Encounter (Signed)
Done

## 2016-11-17 ENCOUNTER — Telehealth: Payer: Self-pay

## 2016-11-17 NOTE — Telephone Encounter (Signed)
Writer called patient to see if he picked up the phenergan suppositories and also to see how he was feeling.  Patient states that pharmacy will not give him the medication unless he has $10.  Patient states he has been trying to gather the money for two weeks.  He states that his stomach feels better but the suppositories really help with the N/V.

## 2017-02-02 ENCOUNTER — Emergency Department (HOSPITAL_COMMUNITY): Payer: Self-pay

## 2017-02-02 ENCOUNTER — Encounter (HOSPITAL_COMMUNITY): Payer: Self-pay | Admitting: Nurse Practitioner

## 2017-02-02 ENCOUNTER — Emergency Department (HOSPITAL_COMMUNITY)
Admission: EM | Admit: 2017-02-02 | Discharge: 2017-02-02 | Disposition: A | Discharge status: Home / Self Care | Payer: Self-pay | Attending: Emergency Medicine | Admitting: Emergency Medicine

## 2017-02-02 DIAGNOSIS — Z79899 Other long term (current) drug therapy: Secondary | ICD-10-CM | POA: Insufficient documentation

## 2017-02-02 DIAGNOSIS — G8929 Other chronic pain: Secondary | ICD-10-CM

## 2017-02-02 DIAGNOSIS — R1115 Cyclical vomiting syndrome unrelated to migraine: Secondary | ICD-10-CM

## 2017-02-02 DIAGNOSIS — R1084 Generalized abdominal pain: Secondary | ICD-10-CM | POA: Insufficient documentation

## 2017-02-02 DIAGNOSIS — F1290 Cannabis use, unspecified, uncomplicated: Secondary | ICD-10-CM

## 2017-02-02 DIAGNOSIS — R109 Unspecified abdominal pain: Secondary | ICD-10-CM

## 2017-02-02 DIAGNOSIS — Z87891 Personal history of nicotine dependence: Secondary | ICD-10-CM | POA: Insufficient documentation

## 2017-02-02 DIAGNOSIS — G43A0 Cyclical vomiting, not intractable: Secondary | ICD-10-CM | POA: Insufficient documentation

## 2017-02-02 DIAGNOSIS — I10 Essential (primary) hypertension: Secondary | ICD-10-CM | POA: Insufficient documentation

## 2017-02-02 HISTORY — DX: Cyclical vomiting syndrome unrelated to migraine: R11.15

## 2017-02-02 LAB — CBC WITH DIFFERENTIAL/PLATELET
Basophils Absolute: 0 10*3/uL (ref 0.0–0.1)
Basophils Relative: 0 %
Eosinophils Absolute: 0 10*3/uL (ref 0.0–0.7)
Eosinophils Relative: 0 %
HCT: 38 % — ABNORMAL LOW (ref 39.0–52.0)
Hemoglobin: 12.9 g/dL — ABNORMAL LOW (ref 13.0–17.0)
Lymphocytes Relative: 12 %
Lymphs Abs: 1.6 10*3/uL (ref 0.7–4.0)
MCH: 32.3 pg (ref 26.0–34.0)
MCHC: 33.9 g/dL (ref 30.0–36.0)
MCV: 95 fL (ref 78.0–100.0)
Monocytes Absolute: 1 10*3/uL (ref 0.1–1.0)
Monocytes Relative: 7 %
Neutro Abs: 11 10*3/uL — ABNORMAL HIGH (ref 1.7–7.7)
Neutrophils Relative %: 81 %
Platelets: 272 10*3/uL (ref 150–400)
RBC: 4 MIL/uL — ABNORMAL LOW (ref 4.22–5.81)
RDW: 12.3 % (ref 11.5–15.5)
WBC: 13.7 10*3/uL — ABNORMAL HIGH (ref 4.0–10.5)

## 2017-02-02 LAB — URINALYSIS, ROUTINE W REFLEX MICROSCOPIC
Bilirubin Urine: NEGATIVE
Glucose, UA: 150 mg/dL — AB
Hgb urine dipstick: NEGATIVE
Ketones, ur: 20 mg/dL — AB
Leukocytes, UA: NEGATIVE
Nitrite: NEGATIVE
Protein, ur: NEGATIVE mg/dL
Specific Gravity, Urine: 1.028 (ref 1.005–1.030)
pH: 5 (ref 5.0–8.0)

## 2017-02-02 LAB — RAPID URINE DRUG SCREEN, HOSP PERFORMED
Amphetamines: NOT DETECTED
Barbiturates: NOT DETECTED
Benzodiazepines: NOT DETECTED
Cocaine: NOT DETECTED
Opiates: NOT DETECTED
Tetrahydrocannabinol: POSITIVE — AB

## 2017-02-02 LAB — LIPASE, BLOOD: Lipase: 18 U/L (ref 11–51)

## 2017-02-02 LAB — COMPREHENSIVE METABOLIC PANEL
ALT: 18 U/L (ref 17–63)
AST: 37 U/L (ref 15–41)
Albumin: 4.6 g/dL (ref 3.5–5.0)
Alkaline Phosphatase: 70 U/L (ref 38–126)
Anion gap: 16 — ABNORMAL HIGH (ref 5–15)
BUN: 12 mg/dL (ref 6–20)
CO2: 20 mmol/L — ABNORMAL LOW (ref 22–32)
Calcium: 9.9 mg/dL (ref 8.9–10.3)
Chloride: 107 mmol/L (ref 101–111)
Creatinine, Ser: 1.04 mg/dL (ref 0.61–1.24)
GFR calc Af Amer: >60 mL/min (ref >60)
GFR calc non Af Amer: >60 mL/min (ref >60)
Glucose, Bld: 174 mg/dL — ABNORMAL HIGH (ref 65–99)
Potassium: 3.9 mmol/L (ref 3.5–5.1)
Sodium: 143 mmol/L (ref 135–145)
Total Bilirubin: 1.5 mg/dL — ABNORMAL HIGH (ref 0.3–1.2)
Total Protein: 8.1 g/dL (ref 6.5–8.1)

## 2017-02-02 MED ORDER — PROMETHAZINE HCL 25 MG/ML IJ SOLN
12.5000 mg | Freq: Once | INTRAMUSCULAR | Status: AC
Start: 2017-02-02 — End: 2017-02-02
  Administered 2017-02-02: 12.5 mg via INTRAVENOUS
  Filled 2017-02-02: qty 1

## 2017-02-02 MED ORDER — HALOPERIDOL LACTATE 5 MG/ML IJ SOLN
2.0000 mg | Freq: Once | INTRAMUSCULAR | Status: AC
  Administered 2017-02-02: 2 mg via INTRAMUSCULAR
  Filled 2017-02-02: qty 1

## 2017-02-02 MED ORDER — ONDANSETRON HCL 4 MG/2ML IJ SOLN: 4.0000 mg | Freq: Once | INTRAMUSCULAR | Status: DC | PRN

## 2017-02-02 NOTE — ED Notes (Signed)
Bed: WA08 Expected date:  Expected time:  Means of arrival:  Comments: Abd pain vomiting

## 2017-02-02 NOTE — ED Provider Notes (Signed)
[facility]-EMERGENCY DEPT Provider Note   CSN: [id number] Arrival date & time: 02/02/17  [id number]     History   Chief Complaint Chief Complaint  Patient presents with  . Abdominal Pain  . Nausea  . Emesis    [name] is a 36 y.o. male.  HPI  Pt was seen at 0950.  Per pt, c/o gradual onset and persistence of constant acute flair of his chronic generalized abd "pain" since earlier this morning.  Has been associated with multiple intermittent episodes of N/V. Last BM was today and was normal for him.  Describes the abd pain as per his usual chronic pain pattern for the past 5 years. States he was told his chronic, recurrent symptoms were "due to smoking marijuana."  Denies diarrhea, no fevers, no back pain, no rash, no CP/SOB, no black or blood in stools or emesis.      Past Medical History:  Diagnosis Date  . 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 Chronic abdominal pain   . Cyclical vomiting syndrome   . GASTRITIS 12/09/2009   Qualifier: Diagnosis of  By: Daphine DeutscherMartin FNP, Zena AmosNykedtra    . Gastroparesis   . HELICOBACTER PYLORI INFECTION 12/14/2009   Qualifier: Diagnosis of  By: Levon Hedgerraddock, Brenda    . Nausea and vomiting    chronic, recurrent  . Polysubstance abuse     Patient Active Problem List   Diagnosis Date Noted  . Uncontrollable vomiting 03/10/2016  . Anemia 03/10/2016  . Metatarsal fracture 12/22/2015  . Essential hypertension 12/22/2015  . Night sweats 01/21/2015  . GERD (gastroesophageal reflux disease) 01/21/2015  . Gastritis 12/19/2014  . Hyperbilirubinemia 12/19/2014  . Elevated bilirubin   . Nausea & vomiting 12/14/2014  . Severe tetrahydrocannabinol (THC) dependence (HCC) 12/14/2014  . Alcohol abuse 12/14/2014  . Cyclic vomiting syndrome 12/06/2014  . 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 Gastroparesis 05/11/2012  . Abdominal pain 05/06/2012  . Polysubstance abuse  05/06/2012  . CHOLECYSTECTOMY, HX OF 11/25/2009    Past Surgical History:  Procedure Laterality Date  . CHOLECYSTECTOMY  11/2009       Home Medications    Prior to Admission medications   Medication Sig Start Date End Date Taking? Authorizing Provider  lisinopril (PRINIVIL,ZESTRIL) 5 MG tablet Take 1 tablet (5 mg total) by mouth daily. 08/10/16  Yes Jaclyn ShaggyAmao, Enobong, MD  metoCLOPramide (REGLAN) 10 MG tablet Take 1 tablet (10 mg total) by mouth every 6 (six) hours. 05/08/16  Yes Donnetta Hutchingook, Brian, MD  omeprazole (PRILOSEC) 20 MG capsule Take 1 capsule (20 mg total) by mouth daily. 08/10/16  Yes Jaclyn ShaggyAmao, Enobong, MD  ondansetron (ZOFRAN) 8 MG tablet Take 1 tablet (8 mg total) by mouth every 8 (eight) hours as needed for nausea or vomiting. 08/10/16  Yes Jaclyn ShaggyAmao, Enobong, MD  promethazine (PHENERGAN) 25 MG suppository Place 1 suppository (25 mg total) rectally every 8 (eight) hours as needed for nausea or vomiting. 11/08/16  Yes Jaclyn ShaggyAmao, Enobong, MD  promethazine (PHENERGAN) 25 MG tablet Take 1 tablet (25 mg total) by mouth every 6 (six) hours as needed for nausea or vomiting. 10/28/16  Yes Kirichenko, Tatyana, PA-C  amoxicillin (AMOXIL) 500 MG capsule Take 1 capsule (500 mg total) by mouth 3 (three) times daily. Patient not taking: Reported on 10/28/2016 08/10/16   Jaclyn ShaggyAmao, Enobong, MD    Family History Family History  Problem Relation Age of Onset  . Other Father   .  Diabetes Father   . Hypertension Father     Social History Social History  Substance Use Topics  . Smoking status: Former Smoker    Years: 8.00    Types: Cigars    Quit date: 12/15/2014  . Smokeless tobacco: Never Used  . Alcohol use No     Comment: 12/16/2014 " I no Longer drink ,I quit about 2 years ago "     Allergies   Patient has no known allergies.   Review of Systems Review of Systems ROS: Statement: All systems negative except as marked or noted in the HPI; Constitutional: Negative for fever and chills. ; ; Eyes: Negative  for eye pain, redness and discharge. ; ; ENMT: Negative for ear pain, hoarseness, nasal congestion, sinus pressure and sore throat. ; ; Cardiovascular: Negative for chest pain, palpitations, diaphoresis, dyspnea and peripheral edema. ; ; Respiratory: Negative for cough, wheezing and stridor. ; ; Gastrointestinal: +N/V, abd pain. Negative for diarrhea, blood in stool, hematemesis, jaundice and rectal bleeding. . ; ; Genitourinary: Negative for dysuria, flank pain and hematuria. ; ; Musculoskeletal: Negative for back pain and neck pain. Negative for swelling and trauma.; ; Skin: Negative for pruritus, rash, abrasions, blisters, bruising and skin lesion.; ; Neuro: Negative for headache, lightheadedness and neck stiffness. Negative for weakness, altered level of consciousness, altered mental status, extremity weakness, paresthesias, involuntary movement, seizure and syncope.       Physical Exam Updated Vital Signs BP (!) 151/85 (BP Location: Right Arm)   Pulse 90   Temp 97.6 F (36.4 C) (Oral)   Resp 20   SpO2 98%   Physical Exam 0955: Physical examination:  Nursing notes reviewed; Vital signs and O2 SAT reviewed;  Constitutional: Well developed, Well nourished, Well hydrated, Uncomfortable appearing; Head:  Normocephalic, atraumatic; Eyes: EOMI, PERRL, No scleral icterus; ENMT: Mouth and pharynx normal, Mucous membranes moist; Neck: Supple, Full range of motion, No lymphadenopathy; Cardiovascular: Regular rate and rhythm, No gallop; Respiratory: Breath sounds clear & equal bilaterally, No wheezes.  Speaking full sentences with ease, Normal respiratory effort/excursion; Chest: Nontender, Movement normal; Abdomen: Soft, +diffuse tenderness to palp. Nondistended, Normal bowel sounds; Genitourinary: No CVA tenderness; Extremities: Pulses normal, No tenderness, No edema, No calf edema or asymmetry.; Neuro: AA&Ox3, Major CN grossly intact.  Speech clear. No gross focal motor or sensory deficits in  extremities.; Skin: Color normal, Warm, Dry.   ED Treatments / Results  Labs (all labs ordered are listed, but only abnormal results are displayed)   EKG  EKG Interpretation None       Radiology   Procedures Procedures (including critical care time)  Medications Ordered in ED Medications  haloperidol lactate (HALDOL) injection 2 mg (2 mg Intramuscular Given 02/02/17 1011)  promethazine (PHENERGAN) injection 12.5 mg (12.5 mg Intravenous Given 02/02/17 1016)     Initial Impression / Assessment and Plan / ED Course  I have reviewed the triage vital signs and the nursing notes.  Pertinent labs & imaging results that were available during my care of the patient were reviewed by me and considered in my medical decision making (see chart for details).  MDM Reviewed: previous chart, nursing note and vitals Reviewed previous: labs and CT scan Interpretation: labs and x-ray   Results for orders placed or performed during the hospital encounter of 02/02/17  Lipase, blood  Result Value Ref Range   Lipase 18 11 - 51 U/L  Comprehensive metabolic panel  Result Value Ref Range   Sodium 143 135 -  145 mmol/L   Potassium 3.9 3.5 - 5.1 mmol/L   Chloride 107 101 - 111 mmol/L   CO2 20 (L) 22 - 32 mmol/L   Glucose, Bld 174 (H) 65 - 99 mg/dL   BUN 12 6 - 20 mg/dL   Creatinine, Ser 1.61 0.61 - 1.24 mg/dL   Calcium 9.9 8.9 - 09.6 mg/dL   Total Protein 8.1 6.5 - 8.1 g/dL   Albumin 4.6 3.5 - 5.0 g/dL   AST 37 15 - 41 U/L   ALT 18 17 - 63 U/L   Alkaline Phosphatase 70 38 - 126 U/L   Total Bilirubin 1.5 (H) 0.3 - 1.2 mg/dL   GFR calc non Af Amer >60 >60 mL/min   GFR calc Af Amer >60 >60 mL/min   Anion gap 16 (H) 5 - 15  Urinalysis, Routine w reflex microscopic  Result Value Ref Range   Color, Urine YELLOW YELLOW   APPearance CLEAR CLEAR   Specific Gravity, Urine 1.028 1.005 - 1.030   pH 5.0 5.0 - 8.0   Glucose, UA 150 (A) NEGATIVE mg/dL   Hgb urine dipstick NEGATIVE NEGATIVE    Bilirubin Urine NEGATIVE NEGATIVE   Ketones, ur 20 (A) NEGATIVE mg/dL   Protein, ur NEGATIVE NEGATIVE mg/dL   Nitrite NEGATIVE NEGATIVE   Leukocytes, UA NEGATIVE NEGATIVE  CBC with Differential  Result Value Ref Range   WBC 13.7 (H) 4.0 - 10.5 K/uL   RBC 4.00 (L) 4.22 - 5.81 MIL/uL   Hemoglobin 12.9 (L) 13.0 - 17.0 g/dL   HCT 04.5 (L) 40.9 - 81.1 %   MCV 95.0 78.0 - 100.0 fL   MCH 32.3 26.0 - 34.0 pg   MCHC 33.9 30.0 - 36.0 g/dL   RDW 91.4 78.2 - 95.6 %   Platelets 272 150 - 400 K/uL   Neutrophils Relative % 81 %   Neutro Abs 11.0 (H) 1.7 - 7.7 K/uL   Lymphocytes Relative 12 %   Lymphs Abs 1.6 0.7 - 4.0 K/uL   Monocytes Relative 7 %   Monocytes Absolute 1.0 0.1 - 1.0 K/uL   Eosinophils Relative 0 %   Eosinophils Absolute 0.0 0.0 - 0.7 K/uL   Basophils Relative 0 %   Basophils Absolute 0.0 0.0 - 0.1 K/uL  Urine rapid drug screen (hosp performed)  Result Value Ref Range   Opiates NONE DETECTED NONE DETECTED   Cocaine NONE DETECTED NONE DETECTED   Benzodiazepines NONE DETECTED NONE DETECTED   Amphetamines NONE DETECTED NONE DETECTED   Tetrahydrocannabinol POSITIVE (A) NONE DETECTED   Barbiturates NONE DETECTED NONE DETECTED   Dg Abd Acute W/chest Result Date: 02/02/2017 CLINICAL DATA:  Sudden onset generalized abdominal pain. Nausea, vomiting. EXAM: DG ABDOMEN ACUTE W/ 1V CHEST COMPARISON:  10/28/2016 FINDINGS: Prior cholecystectomy. The bowel gas pattern is normal. There is no evidence of free intraperitoneal air. No suspicious radio-opaque calculi or other significant radiographic abnormality is seen. Heart size and mediastinal contours are within normal limits. Both lungs are clear. IMPRESSION: No acute findings. Electronically Signed   By: Charlett Nose M.D.   On: 02/02/2017 10:46    1340:   Pt has tol PO well while in the ED without N/V.  No stooling while in the ED.  Labs per baseline. Workup reassuring, VSS. Has slept most of ED visit after receiving meds. Feels better and  wants to go home now. Pt already has rx phenergan suppositories. Long hx of chronic pain with multiple ED visits for same.  Pt  endorses acute flair of his usual long standing chronic pain today, no change from his usual chronic pain pattern.  Pt encouraged to f/u with his PMD and GI MD for good continuity of care and control of his chronic pain.  Pt verb understanding. Dx and testing d/w pt and family.  Questions answered.  Verb understanding, agreeable to d/c home with outpt f/u.     Final Clinical Impressions(s) / ED Diagnoses   Final diagnoses:  None    New Prescriptions New Prescriptions   No medications on file     Samuel Jester, DO 02/05/17 1645

## 2017-02-02 NOTE — Discharge Instructions (Signed)
Take your usual prescriptions as previously directed.  Try to stop smoking marijuana; it some people it can cause recurrent abdominal pain and nausea/vomiting with long term use.  Increase your fluid intake (ie:  Gatoraide) for the next few days  Eat a bland diet and advance to your regular diet slowly as you can tolerate it.  Call your regular medical doctor today to schedule a follow up appointment this week.  Return to the Emergency Department immediately if not improving (or even worsening) despite taking the medicines as prescribed, any black or bloody stool or vomit, if you develop a fever over "101," or for any other concerns.

## 2017-02-02 NOTE — ED Triage Notes (Signed)
Pt arrived via EMS with c/o abdominal pain in all x4 quads,  n/v for the past half hour or so as soon as he woke up this morning. Per ems patient does not have gallbladder or appendix. He is from home with LBM today and was normal. Denies eating anything different prior to bed. No significant hx. #18 LAC, NSR, 150/80, 70 regular, 20, 99% RA. Patient did vomit several times in route. 4mg  Zofran ODT given @ 0920.

## 2017-02-03 ENCOUNTER — Emergency Department (HOSPITAL_COMMUNITY)
Admission: EM | Admit: 2017-02-03 | Discharge: 2017-02-04 | Disposition: A | Discharge status: Home / Self Care | Payer: Self-pay | Attending: Emergency Medicine | Admitting: Emergency Medicine

## 2017-02-03 DIAGNOSIS — G43A0 Cyclical vomiting, not intractable: Secondary | ICD-10-CM | POA: Insufficient documentation

## 2017-02-03 DIAGNOSIS — Z79899 Other long term (current) drug therapy: Secondary | ICD-10-CM | POA: Insufficient documentation

## 2017-02-03 DIAGNOSIS — K920 Hematemesis: Secondary | ICD-10-CM | POA: Insufficient documentation

## 2017-02-03 DIAGNOSIS — Z87891 Personal history of nicotine dependence: Secondary | ICD-10-CM | POA: Insufficient documentation

## 2017-02-03 DIAGNOSIS — I10 Essential (primary) hypertension: Secondary | ICD-10-CM | POA: Insufficient documentation

## 2017-02-04 ENCOUNTER — Emergency Department (HOSPITAL_COMMUNITY): Payer: Self-pay

## 2017-02-04 ENCOUNTER — Encounter (HOSPITAL_COMMUNITY): Payer: Self-pay | Admitting: Emergency Medicine

## 2017-02-04 LAB — CBC WITH DIFFERENTIAL/PLATELET
Basophils Absolute: 0 10*3/uL (ref 0.0–0.1)
Basophils Relative: 0 %
Eosinophils Absolute: 0 10*3/uL (ref 0.0–0.7)
Eosinophils Relative: 0 %
HCT: 40.8 % (ref 39.0–52.0)
Hemoglobin: 14.2 g/dL (ref 13.0–17.0)
Lymphocytes Relative: 12 %
Lymphs Abs: 2.6 10*3/uL (ref 0.7–4.0)
MCH: 32.4 pg (ref 26.0–34.0)
MCHC: 34.8 g/dL (ref 30.0–36.0)
MCV: 93.2 fL (ref 78.0–100.0)
Monocytes Absolute: 1.8 10*3/uL — ABNORMAL HIGH (ref 0.1–1.0)
Monocytes Relative: 9 %
Neutro Abs: 16.8 10*3/uL — ABNORMAL HIGH (ref 1.7–7.7)
Neutrophils Relative %: 79 %
Platelets: 302 10*3/uL (ref 150–400)
RBC: 4.38 MIL/uL (ref 4.22–5.81)
RDW: 12.3 % (ref 11.5–15.5)
WBC: 21.3 10*3/uL — ABNORMAL HIGH (ref 4.0–10.5)

## 2017-02-04 LAB — URINALYSIS, ROUTINE W REFLEX MICROSCOPIC
Bacteria, UA: NONE SEEN
Bilirubin Urine: NEGATIVE
Glucose, UA: NEGATIVE mg/dL
Ketones, ur: 20 mg/dL — AB
Leukocytes, UA: NEGATIVE
Nitrite: NEGATIVE
Protein, ur: 100 mg/dL — AB
Specific Gravity, Urine: >1.046 — ABNORMAL HIGH (ref 1.005–1.030)
pH: 5 (ref 5.0–8.0)

## 2017-02-04 LAB — COMPREHENSIVE METABOLIC PANEL
ALT: 28 U/L (ref 17–63)
AST: 79 U/L — ABNORMAL HIGH (ref 15–41)
Albumin: 5.2 g/dL — ABNORMAL HIGH (ref 3.5–5.0)
Alkaline Phosphatase: 74 U/L (ref 38–126)
Anion gap: 16 — ABNORMAL HIGH (ref 5–15)
BUN: 27 mg/dL — ABNORMAL HIGH (ref 6–20)
CO2: 24 mmol/L (ref 22–32)
Calcium: 10.4 mg/dL — ABNORMAL HIGH (ref 8.9–10.3)
Chloride: 98 mmol/L — ABNORMAL LOW (ref 101–111)
Creatinine, Ser: 1.14 mg/dL (ref 0.61–1.24)
GFR calc Af Amer: >60 mL/min (ref >60)
GFR calc non Af Amer: >60 mL/min (ref >60)
Glucose, Bld: 126 mg/dL — ABNORMAL HIGH (ref 65–99)
Potassium: 4.1 mmol/L (ref 3.5–5.1)
Sodium: 138 mmol/L (ref 135–145)
Total Bilirubin: 3.1 mg/dL — ABNORMAL HIGH (ref 0.3–1.2)
Total Protein: 8.9 g/dL — ABNORMAL HIGH (ref 6.5–8.1)

## 2017-02-04 LAB — OCCULT BLOOD GASTRIC / DUODENUM (SPECIMEN CUP)
Occult Blood, Gastric: POSITIVE — AB
pH, Gastric: 2

## 2017-02-04 LAB — ETHANOL: Alcohol, Ethyl (B): <5 mg/dL (ref <5)

## 2017-02-04 LAB — LIPASE, BLOOD: Lipase: 18 U/L (ref 11–51)

## 2017-02-04 MED ORDER — METOCLOPRAMIDE HCL 5 MG/ML IJ SOLN
10.0000 mg | Freq: Once | INTRAMUSCULAR | Status: AC
  Administered 2017-02-04: 10 mg via INTRAVENOUS
  Filled 2017-02-04: qty 2

## 2017-02-04 MED ORDER — SODIUM CHLORIDE 0.9 % IV BOLUS (SEPSIS)
1000.0000 mL | Freq: Once | INTRAVENOUS | Status: AC
  Administered 2017-02-04: 1000 mL via INTRAVENOUS

## 2017-02-04 MED ORDER — MORPHINE SULFATE (PF) 4 MG/ML IV SOLN
4.0000 mg | Freq: Once | INTRAVENOUS | Status: AC
  Administered 2017-02-04: 4 mg via INTRAVENOUS
  Filled 2017-02-04: qty 1

## 2017-02-04 MED ORDER — PANTOPRAZOLE SODIUM 20 MG PO TBEC
20.0000 mg | DELAYED_RELEASE_TABLET | Freq: Every day | ORAL | 0 refills | Status: DC
Start: 2017-02-04 — End: 2018-02-26

## 2017-02-04 MED ORDER — SODIUM CHLORIDE 0.9 % IV SOLN
1000.0000 mL | INTRAVENOUS | Status: DC
  Administered 2017-02-04: 1000 mL via INTRAVENOUS

## 2017-02-04 MED ORDER — IOPAMIDOL (ISOVUE-300) INJECTION 61%
100.0000 mL | Freq: Once | INTRAVENOUS | Status: AC | PRN
  Administered 2017-02-04: 100 mL via INTRAVENOUS

## 2017-02-04 MED ORDER — SODIUM CHLORIDE 0.9 % IV SOLN
80.0000 mg | Freq: Once | INTRAVENOUS | Status: AC
  Administered 2017-02-04: 80 mg via INTRAVENOUS
  Filled 2017-02-04: qty 80

## 2017-02-04 MED ORDER — PROMETHAZINE HCL 25 MG/ML IJ SOLN: 25.0000 mg | Freq: Once | INTRAMUSCULAR | Status: DC

## 2017-02-04 MED ORDER — IOPAMIDOL (ISOVUE-300) INJECTION 61%
INTRAVENOUS | Status: AC
  Filled 2017-02-04: qty 100

## 2017-02-04 MED ORDER — LORAZEPAM 2 MG/ML IJ SOLN
1.0000 mg | Freq: Once | INTRAMUSCULAR | Status: AC
  Administered 2017-02-04: 1 mg via INTRAVENOUS
  Filled 2017-02-04: qty 1

## 2017-02-04 MED ORDER — HALOPERIDOL LACTATE 5 MG/ML IJ SOLN: 2.0000 mg | Freq: Once | INTRAMUSCULAR | Status: DC

## 2017-02-04 MED ORDER — ONDANSETRON HCL 4 MG/2ML IJ SOLN
4.0000 mg | Freq: Once | INTRAMUSCULAR | Status: AC
  Administered 2017-02-04: 4 mg via INTRAVENOUS
  Filled 2017-02-04: qty 2

## 2017-02-04 MED ORDER — CAPSAICIN-MENTHOL-METHYL SAL 0.025-1-12 % EX CREA: 1.0000 [in_us] | TOPICAL_CREAM | Freq: Two times a day (BID) | CUTANEOUS | 0 refills | Status: DC

## 2017-02-04 MED ORDER — CAPSAICIN 0.025 % EX CREA
TOPICAL_CREAM | Freq: Two times a day (BID) | CUTANEOUS | Status: DC
  Administered 2017-02-04: 05:00:00 via TOPICAL
  Filled 2017-02-04: qty 60

## 2017-02-04 NOTE — ED Provider Notes (Signed)
[facility]-EMERGENCY DEPT Provider Note   CSN: [id number] Arrival date & time: 02/03/17  2359     History   Chief Complaint Chief Complaint  Patient presents with  . Abdominal Pain    [name] is a 36 y.o. male with a hx of Chronic abdominal pain, gastritis and cyclical vomiting syndrome presents to the Emergency Department complaining of gradual, persistent, progressively worsening RLQ abd pain and radiating to the right lower back onset 2-3 days ago.  Pain is sharp, constant, 9/10.  Eating and drinking aggravates the pain.  Pt reports uncontrolled burping and vomiting.  Pt reports he vomited 5 times today.  He is unable to eat or drink anything without emesis.  Pt reports hot and cold chills.  Nothing makes it better.  Pt reports 1 episode of watery diarrhea 2 days ago without melena or hematochezia.  Pt reports no international travel.  No known sick contacts.  Pt with hx of cholecystectomy.  Pt reports smoking marijuana 4 days ago and usually smokes daily.  Pt denies fevers, hematemesis.     The history is provided by the patient and medical records. No language interpreter was used.    Past Medical History:  Diagnosis Date  . 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 Chronic abdominal pain   . Cyclical vomiting syndrome   . GASTRITIS 12/09/2009   Qualifier: Diagnosis of  By: Daphine Deutscher FNP, Zena Amos    . Gastroparesis   . 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 Nausea and vomiting    chronic, recurrent  . Polysubstance abuse     Patient Active Problem List   Diagnosis Date Noted  . Uncontrollable vomiting 03/10/2016  . Anemia 03/10/2016  . Metatarsal fracture 12/22/2015  . Essential hypertension 12/22/2015  . Night sweats 01/21/2015  . GERD (gastroesophageal reflux disease) 01/21/2015  . Gastritis 12/19/2014  . Hyperbilirubinemia 12/19/2014  . Elevated bilirubin   .  Nausea & vomiting 12/14/2014  . Severe tetrahydrocannabinol (THC) dependence (HCC) 12/14/2014  . Alcohol abuse 12/14/2014  . Cyclic vomiting syndrome 12/06/2014  . 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 Gastroparesis 05/11/2012  . Abdominal pain 05/06/2012  . Polysubstance abuse 05/06/2012  . CHOLECYSTECTOMY, HX OF 11/25/2009    Past Surgical History:  Procedure Laterality Date  . CHOLECYSTECTOMY  11/2009       Home Medications    Prior to Admission medications   Medication Sig Start Date End Date Taking? Authorizing Provider  lisinopril (PRINIVIL,ZESTRIL) 5 MG tablet Take 1 tablet (5 mg total) by mouth daily. 08/10/16  Yes Jaclyn Shaggy, MD  metoCLOPramide (REGLAN) 10 MG tablet Take 1 tablet (10 mg total) by mouth every 6 (six) hours. Patient taking differently: Take 10 mg by mouth every 6 (six) hours as needed for nausea or vomiting.  05/08/16  Yes Donnetta Hutching, MD  omeprazole (PRILOSEC) 20 MG capsule Take 1 capsule (20 mg total) by mouth daily. 08/10/16  Yes Jaclyn Shaggy, MD  ondansetron (ZOFRAN) 8 MG tablet Take 1 tablet (8 mg total) by mouth every 8 (eight) hours as needed for nausea or vomiting. 08/10/16  Yes Jaclyn Shaggy, MD  promethazine (PHENERGAN) 25 MG suppository Place 1 suppository (25 mg total) rectally every 8 (eight) hours as needed for nausea or vomiting. 11/08/16  Yes Amao, Odette Horns, MD  promethazine (PHENERGAN) 25 MG tablet Take 1 tablet (25 mg  total) by mouth every 6 (six) hours as needed for nausea or vomiting. 10/28/16  Yes Kirichenko, Tatyana, PA-C  Capsaicin-Menthol-Methyl Sal (CAPSAICIN-METHYL SAL-MENTHOL) 0.025-1-12 % CREA Apply 1 inch topically 2 (two) times daily. 02/04/17   Terica Yogi, Dahlia ClientHannah, PA-C  pantoprazole (PROTONIX) 20 MG tablet Take 1 tablet (20 mg total) by mouth daily. 02/04/17   Bryli Mantey, Dahlia ClientHannah, PA-C    Family History Family History  Problem Relation Age of Onset  . Other Father   . Diabetes Father     . Hypertension Father     Social History Social History  Substance Use Topics  . Smoking status: Former Smoker    Years: 8.00    Types: Cigars    Quit date: 12/15/2014  . Smokeless tobacco: Never Used  . Alcohol use No     Comment: 12/16/2014 " I no Longer drink ,I quit about 2 years ago "     Allergies   Patient has no known allergies.   Review of Systems Review of Systems  Constitutional: Positive for appetite change, chills and diaphoresis.  HENT: Negative for dental problem.   Eyes: Negative for visual disturbance.  Respiratory: Positive for shortness of breath. Negative for chest tightness.   Cardiovascular: Negative for chest pain.  Gastrointestinal: Positive for abdominal pain, diarrhea, nausea and vomiting.  Endocrine: Negative for polydipsia, polyphagia and polyuria.  Genitourinary: Negative for dysuria and flank pain.  Musculoskeletal: Negative for back pain.  Skin: Negative for rash and wound.  Neurological: Negative for headaches.  Hematological: Negative for adenopathy.  Psychiatric/Behavioral: Negative for confusion.  All other systems reviewed and are negative.    Physical Exam Updated Vital Signs BP (!) 136/50 (BP Location: Left Arm)   Temp 98.9 F (37.2 C) (Oral)   Resp 18   SpO2 98%   Physical Exam  Constitutional: He appears well-developed and well-nourished. He appears distressed.  Awake, alert,   HENT:  Head: Normocephalic and atraumatic.  Mouth/Throat: Oropharynx is clear and moist. No oropharyngeal exudate.  Eyes: Conjunctivae are normal. No scleral icterus.  Neck: Normal range of motion. Neck supple.  Cardiovascular: Normal rate, regular rhythm and intact distal pulses.   Pulmonary/Chest: Effort normal and breath sounds normal. No respiratory distress. He has no wheezes.  Equal chest expansion  Abdominal: Soft. Bowel sounds are normal. He exhibits no distension and no mass. There is generalized tenderness and tenderness in the right  lower quadrant. There is guarding. There is no rigidity, no rebound and no CVA tenderness.  Generalized, worst in the RLQ  Musculoskeletal: Normal range of motion. He exhibits no edema.  Neurological: He is alert.  Speech is clear and goal oriented Moves extremities without ataxia  Skin: Skin is warm. He is diaphoretic.  Psychiatric: He has a normal mood and affect.  Nursing note and vitals reviewed.    ED Treatments / Results  Labs (all labs ordered are listed, but only abnormal results are displayed) Labs Reviewed  CBC WITH DIFFERENTIAL/PLATELET - Abnormal; Notable for the following:       Result Value   WBC 21.3 (*)    Neutro Abs 16.8 (*)    Monocytes Absolute 1.8 (*)    All other components within normal limits  COMPREHENSIVE METABOLIC PANEL - Abnormal; Notable for the following:    Chloride 98 (*)    Glucose, Bld 126 (*)    BUN 27 (*)    Calcium 10.4 (*)    Total Protein 8.9 (*)    Albumin 5.2 (*)  AST 79 (*)    Total Bilirubin 3.1 (*)    Anion gap 16 (*)    All other components within normal limits  URINALYSIS, ROUTINE W REFLEX MICROSCOPIC - Abnormal; Notable for the following:    Specific Gravity, Urine >1.046 (*)    Hgb urine dipstick MODERATE (*)    Ketones, ur 20 (*)    Protein, ur 100 (*)    Squamous Epithelial / LPF 0-5 (*)    All other components within normal limits  OCCULT BLOOD GASTRIC / DUODENUM (SPECIMEN CUP) - Abnormal; Notable for the following:    Occult Blood, Gastric POSITIVE (*)    All other components within normal limits  LIPASE, BLOOD  ETHANOL     Radiology Ct Abdomen Pelvis W Contrast  Result Date: 02/04/2017 CLINICAL DATA:  Right lower quadrant pain, vomiting and diaphoresis. EXAM: CT ABDOMEN AND PELVIS WITH CONTRAST TECHNIQUE: Multidetector CT imaging of the abdomen and pelvis was performed using the standard protocol following bolus administration of intravenous contrast. CONTRAST:  ISOVUE-300 IOPAMIDOL (ISOVUE-300) INJECTION  61% COMPARISON:  Radiographs yesterday.  CT 02/19/2016 FINDINGS: Lower chest: The lung bases are clear. Hepatobiliary: Tiny scattered subcentimeter hepatic hypodensities are likely small cysts or biliary hamartomas. No suspicious lesion. Status post cholecystectomy. No biliary dilatation. Pancreas: No ductal dilatation or inflammation. Spleen: Normal in size without focal abnormality. Adrenals/Urinary Tract: Normal adrenal glands. No hydronephrosis or perinephric edema. Punctate nonobstructing stone in the upper right kidney. Ureters are decompressed. Urinary bladder is physiologically distended, no bladder wall thickening. Stomach/Bowel: Stomach is mildly distended. Appendix appears normal. No evidence of bowel wall thickening, distention, or inflammatory changes. Vascular/Lymphatic: No significant vascular findings are present. No enlarged abdominal or pelvic lymph nodes. Reproductive: Prostate is unremarkable. Other: No free air, free fluid, or intra-abdominal fluid collection. Postsurgical clips at the umbilicus. Musculoskeletal: There are no acute or suspicious osseous abnormalities. IMPRESSION: 1. No acute abnormality.  Normal appendix. 2. Mild gastric distention, can be seen with gastroparesis, reported in patients electronic medical record. 3. Nonobstructing right nephrolithiasis. Electronically Signed   By: Rubye Oaks M.D.   On: 02/04/2017 01:44   Dg Abd Acute W/chest  Result Date: 02/02/2017 CLINICAL DATA:  Sudden onset generalized abdominal pain. Nausea, vomiting. EXAM: DG ABDOMEN ACUTE W/ 1V CHEST COMPARISON:  10/28/2016 FINDINGS: Prior cholecystectomy. The bowel gas pattern is normal. There is no evidence of free intraperitoneal air. No suspicious radio-opaque calculi or other significant radiographic abnormality is seen. Heart size and mediastinal contours are within normal limits. Both lungs are clear. IMPRESSION: No acute findings. Electronically Signed   By: Charlett Nose M.D.   On:  02/02/2017 10:46    Procedures Procedures (including critical care time)  Medications Ordered in ED Medications  sodium chloride 0.9 % bolus 1,000 mL (0 mLs Intravenous Stopped 02/04/17 0237)    Followed by  0.9 %  sodium chloride infusion (1,000 mLs Intravenous New Bag/Given 02/04/17 0111)  iopamidol (ISOVUE-300) 61 % injection (not administered)  capsaicin (ZOSTRIX) 0.025 % cream ( Topical Given 02/04/17 0444)  haloperidol lactate (HALDOL) injection 2 mg (2 mg Intravenous Not Given 02/04/17 0447)  ondansetron (ZOFRAN) injection 4 mg (4 mg Intravenous Given 02/04/17 0112)  morphine 4 MG/ML injection 4 mg (4 mg Intravenous Given 02/04/17 0112)  iopamidol (ISOVUE-300) 61 % injection 100 mL (100 mLs Intravenous Contrast Given 02/04/17 0122)  morphine 4 MG/ML injection 4 mg (4 mg Intravenous Given 02/04/17 0257)  metoCLOPramide (REGLAN) injection 10 mg (10 mg Intravenous Given 02/04/17 0257)  sodium chloride 0.9 % bolus 1,000 mL (0 mLs Intravenous Stopped 02/04/17 0409)  LORazepam (ATIVAN) injection 1 mg (1 mg Intravenous Given 02/04/17 0402)  pantoprazole (PROTONIX) 80 mg in sodium chloride 0.9 % 100 mL IVPB (0 mg Intravenous Stopped 02/04/17 0441)     Initial Impression / Assessment and Plan / ED Course  I have reviewed the triage vital signs and the nursing notes.  Pertinent labs & imaging results that were available during my care of the patient were reviewed by me and considered in my medical decision making (see chart for details).  Clinical Course as of Feb 04 610  Sat Feb 04, 2017  0350 Patient continues to vomit despite Zofran, Reglan and Ativan. By mouth trial attempted but patient began vomiting. He is now vomiting blood which is new. Suspect Mallory-Weiss tear. He is significantly dehydrated and will need admission for intractable vomiting and dehydration.  [HM]  [id number] Patient sleeping after capsaicin cream. Will by mouth trial again.  [HM]  [id number] Patient now able to tolerate by mouth  and wishes for discharge home. Patient will go home with Protonix and capsaicin cream.  [HM]    Clinical Course User Index [HM] Amarilys Lyles, Dahlia Client, PA-C    Patient presents with persistent vomiting. He was evaluated in the emergency department 2 days ago for similar symptoms and has not improved. Patient with likely cyclic vomiting syndrome secondary to cannabis usage however patient is exquisitely tender in the right lower quadrant.  CT scan without evidence of acute appendicitis. He is dehydrated with an increased specific gravity and ketones in his urine. BUN is elevated serum creatinine is baseline at 1.14. Patient has been given fluids. He continues to vomit despite multiple treatments.  His last episode of retching showed blood and gastric occult was positive. Patient given Protonix.   6:11 AM Pt wishes for discharge home.  Abd is soft and nontender.  Final Clinical Impressions(s) / ED Diagnoses   Final diagnoses:  Non-intractable cyclical vomiting, presence of nausea not specified  Hematemesis, presence of nausea not specified    New Prescriptions New Prescriptions   CAPSAICIN-MENTHOL-METHYL SAL (CAPSAICIN-METHYL SAL-MENTHOL) 0.025-1-12 % CREA    Apply 1 inch topically 2 (two) times daily.   PANTOPRAZOLE (PROTONIX) 20 MG TABLET    Take 1 tablet (20 mg total) by mouth daily.     Juneau Doughman, Boyd Kerbs 02/04/17 [id number]    Derwood Kaplan, MD 02/04/17 [phone]

## 2017-02-04 NOTE — Discharge Instructions (Signed)
1. Medications: Capsaicin cream, usual home medications 2. Treatment: rest, drink plenty of fluids, advance diet slowly 3. Follow Up: Please followup with your primary doctor in 2 days for discussion of your diagnoses and further evaluation after today's visit; if you do not have a primary care doctor use the resource guide provided to find one; Please return to the ER for persistent vomiting, high fevers or worsening symptoms

## 2017-02-04 NOTE — ED Notes (Signed)
Pt was given ginger rale for po challenge.

## 2017-02-04 NOTE — ED Notes (Signed)
[facility] Expected date:  Expected time:  Means of arrival:  Comments: EMS 36 yo male abdominal pain and vomiting-seen yesterday for same/IV Zofran SBP>200

## 2017-02-04 NOTE — ED Triage Notes (Signed)
Pt BIB EMS for right sided abdominal pain with nausea and vomiting. Given 4 mg Zofran en route. Seen for the pain yesterday.

## 2017-07-01 ENCOUNTER — Emergency Department (HOSPITAL_COMMUNITY)
Admission: EM | Admit: 2017-07-01 | Discharge: 2017-07-01 | Disposition: A | Discharge status: Home / Self Care | Payer: Self-pay | Attending: Emergency Medicine | Admitting: Emergency Medicine

## 2017-07-01 ENCOUNTER — Encounter (HOSPITAL_COMMUNITY): Payer: Self-pay | Admitting: Emergency Medicine

## 2017-07-01 ENCOUNTER — Other Ambulatory Visit: Payer: Self-pay

## 2017-07-01 DIAGNOSIS — Z87891 Personal history of nicotine dependence: Secondary | ICD-10-CM | POA: Insufficient documentation

## 2017-07-01 DIAGNOSIS — K0889 Other specified disorders of teeth and supporting structures: Secondary | ICD-10-CM | POA: Insufficient documentation

## 2017-07-01 DIAGNOSIS — I10 Essential (primary) hypertension: Secondary | ICD-10-CM | POA: Insufficient documentation

## 2017-07-01 DIAGNOSIS — Z79899 Other long term (current) drug therapy: Secondary | ICD-10-CM | POA: Insufficient documentation

## 2017-07-01 MED ORDER — PENICILLIN V POTASSIUM 500 MG PO TABS: 500.0000 mg | ORAL_TABLET | Freq: Four times a day (QID) | ORAL | 0 refills | Status: AC

## 2017-07-01 MED ORDER — LIDOCAINE VISCOUS 2 % MT SOLN: 15.0000 mL | OROMUCOSAL | 0 refills | Status: DC | PRN

## 2017-07-01 MED ORDER — KETOROLAC TROMETHAMINE 60 MG/2ML IM SOLN
60.0000 mg | Freq: Once | INTRAMUSCULAR | Status: AC
  Administered 2017-07-01: 60 mg via INTRAMUSCULAR
  Filled 2017-07-01: qty 2

## 2017-07-01 NOTE — ED Notes (Signed)
Declined W/C at D/C and was escorted to lobby by RN. 

## 2017-07-01 NOTE — Discharge Instructions (Signed)
Swish and spit 15 mL of viscous lidocaine once every 3 hours as needed for mild pain.  Please do not use Orajel while you are taking this medicine.  Please do not use this medication more than every 3 hours due to side effects.  Take 800 mg of ibuprofen with food once every 8 hours or 1000 mg of Tylenol once every 8 hours to help with pain.  If you take Tylenol, 4 hours later you can take ibuprofen, then repeat this in 4 hours.  Take 1 tablet of penicillin every 6 hours for the next 5 days.  This is an antibiotic and is used to prevent infection.  If you are unable to see Dr. Michiel SitesKoelling, I have attached a dental resource guide.  You can call all of the numbers on this page to find a dentist for follow-up.  If you develop new or worsening symptoms, such as the inability to open your mouth, if thick, mucous like material starts draining from the tooth, shortness of breath, or feeling as if your throat is closing, please return to the emergency department for reevaluation.

## 2017-07-01 NOTE — ED Triage Notes (Signed)
Pt. Stated, I've had a toothache for 3 weeks on the left side.

## 2017-07-01 NOTE — ED Provider Notes (Signed)
[facility] Provider Note   CSN: [id number] Arrival date & time: 07/01/17  1023     History   Chief Complaint Chief Complaint  Patient presents with  . Dental Pain    HPI Pauline AusWillie E Radilla is a 36 y.o. male who presents to the emergency department with a chief complaint of left lower sided dental pain that is worsened over the last 3 weeks.  He reports that a tooth on the right lower side of his mouth broke off 1 week ago.  He denies fever, chills, trismus, drooling.  He is treated his symptoms with Orajel and eating on the other side of his mouth with minimal relief.  He does not have a dentist.  The history is provided by the patient. No language interpreter was used.  Dental Pain   This is a new problem. The current episode started more than 1 week ago. The problem occurs constantly. The problem has been gradually worsening. The pain is severe. Treatments tried: Eating on the other side of his mouth and Orajel. The treatment provided mild relief.    Past Medical History:  Diagnosis Date  . 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 Chronic abdominal pain   . Cyclical vomiting syndrome   . GASTRITIS 12/09/2009   Qualifier: Diagnosis of  By: Daphine DeutscherMartin FNP, Zena AmosNykedtra    . Gastroparesis   . HELICOBACTER PYLORI INFECTION 12/14/2009   Qualifier: Diagnosis of  By: Levon Hedgerraddock, Brenda    . Nausea and vomiting    chronic, recurrent  . Polysubstance abuse [facility](HCC)     Patient Active Problem List   Diagnosis Date Noted  . Uncontrollable vomiting 03/10/2016  . Anemia 03/10/2016  . Metatarsal fracture 12/22/2015  . Essential hypertension 12/22/2015  . Night sweats 01/21/2015  . GERD (gastroesophageal reflux disease) 01/21/2015  . Gastritis 12/19/2014  . Hyperbilirubinemia 12/19/2014  . Elevated bilirubin   . Nausea & vomiting 12/14/2014  . Severe tetrahydrocannabinol (THC) dependence (HCC) 12/14/2014  .  Alcohol abuse 12/14/2014  . Cyclic vomiting syndrome 12/06/2014  . 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 Gastroparesis 05/11/2012  . Abdominal pain 05/06/2012  . Polysubstance abuse (HCC) 05/06/2012  . CHOLECYSTECTOMY, HX OF 11/25/2009    Past Surgical History:  Procedure Laterality Date  . CHOLECYSTECTOMY  11/2009       Home Medications    Prior to Admission medications   Medication Sig Start Date End Date Taking? Authorizing Provider  Capsaicin-Menthol-Methyl Sal (CAPSAICIN-METHYL SAL-MENTHOL) 0.025-1-12 % CREA Apply 1 inch topically 2 (two) times daily. 02/04/17   Muthersbaugh, Dahlia ClientHannah, PA-C  lidocaine (XYLOCAINE) 2 % solution Use as directed 15 mLs every 3 (three) hours as needed in the mouth or throat for mouth pain. 07/01/17   Ytzel Gubler A, PA-C  lisinopril (PRINIVIL,ZESTRIL) 5 MG tablet Take 1 tablet (5 mg total) by mouth daily. 08/10/16   Jaclyn ShaggyAmao, Enobong, MD  metoCLOPramide (REGLAN) 10 MG tablet Take 1 tablet (10 mg total) by mouth every 6 (six) hours. Patient taking differently: Take 10 mg by mouth every 6 (six) hours as needed for nausea or vomiting.  05/08/16   Donnetta Hutchingook, Brian, MD  omeprazole (PRILOSEC) 20 MG capsule Take 1 capsule (20 mg total) by mouth daily. 08/10/16   Jaclyn ShaggyAmao, Enobong, MD  ondansetron (ZOFRAN) 8 MG tablet Take 1 tablet (8 mg total) by mouth every 8 (eight) hours as needed for nausea or vomiting. 08/10/16  Jaclyn ShaggyAmao, Enobong, MD  pantoprazole (PROTONIX) 20 MG tablet Take 1 tablet (20 mg total) by mouth daily. 02/04/17   Muthersbaugh, Dahlia ClientHannah, PA-C  penicillin v potassium (VEETID) 500 MG tablet Take 1 tablet (500 mg total) 4 (four) times daily for 5 days by mouth. 07/01/17 07/06/17  Annamary Buschman A, PA-C  promethazine (PHENERGAN) 25 MG suppository Place 1 suppository (25 mg total) rectally every 8 (eight) hours as needed for nausea or vomiting. 11/08/16   Jaclyn ShaggyAmao, Enobong, MD  promethazine (PHENERGAN) 25 MG tablet Take 1 tablet (25 mg  total) by mouth every 6 (six) hours as needed for nausea or vomiting. 10/28/16   Jaynie CrumbleKirichenko, Tatyana, PA-C    Family History Family History  Problem Relation Age of Onset  . Other Father   . Diabetes Father   . Hypertension Father     Social History Social History   Tobacco Use  . Smoking status: Former Smoker    Years: 8.00    Types: Cigars    Last attempt to quit: 12/15/2014    Years since quitting: 2.5  . Smokeless tobacco: Never Used  Substance Use Topics  . Alcohol use: No    Alcohol/week: 0.0 oz    Comment: 12/16/2014 " I no Longer drink ,I quit about 2 years ago "  . Drug use: Yes    Types: Marijuana    Comment: christmas day     Allergies   Patient has no known allergies.   Review of Systems Review of Systems  Constitutional: Negative for activity change, chills and fever.  HENT: Positive for dental problem. Negative for congestion, facial swelling, sore throat, trouble swallowing and voice change.   Respiratory: Negative for shortness of breath.   Cardiovascular: Negative for chest pain.  Gastrointestinal: Negative for abdominal pain.  Musculoskeletal: Negative for back pain, neck pain and neck stiffness.  Skin: Negative for rash.     Physical Exam Updated Vital Signs BP 128/80 (BP Location: Right Arm)   Pulse 97   Temp 98.5 F (36.9 C) (Oral)   Resp 16   Ht 5\' 9"  (1.753 m)   Wt 89.8 kg (198 lb)   SpO2 100%   BMI 29.24 kg/m   Physical Exam  Constitutional: He appears well-developed.  HENT:  Head: Normocephalic.  Right Ear: Tympanic membrane and ear canal normal.  Left Ear: Tympanic membrane and ear canal normal.  Nose: Nose normal.  Mouth/Throat: Uvula is midline, oropharynx is clear and moist and mucous membranes are normal. He does not have dentures. No oral lesions. No trismus in the jaw. Abnormal dentition. Dental caries present. No dental abscesses, uvula swelling or lacerations. No oropharyngeal exudate, posterior oropharyngeal edema,  posterior oropharyngeal erythema or tonsillar abscesses. No tonsillar exudate.    Poor dentition.  No swelling of the face bilaterally.  No dental abscess noted.  Cervical adenopathy noted on the left anterior neck.  No trismus, drooling.  Eyes: Conjunctivae are normal.  Neck: Neck supple.  No meningeal signs.  Cardiovascular: Normal rate, regular rhythm, normal heart sounds and intact distal pulses. Exam reveals no gallop and no friction rub.  No murmur heard. Pulmonary/Chest: Effort normal. No stridor. No respiratory distress. He has no wheezes. He has no rales.  Abdominal: Soft. He exhibits no distension.  Neurological: He is alert.  Skin: Skin is warm and dry.  Psychiatric: His behavior is normal.  Nursing note and vitals reviewed.    ED Treatments / Results  Labs (all labs ordered are listed, but only abnormal  results are displayed) Labs Reviewed - No data to display  EKG  EKG Interpretation None       Radiology No results found.  Procedures Procedures (including critical care time)  Medications Ordered in ED Medications  ketorolac (TORADOL) injection 60 mg (not administered)     Initial Impression / Assessment and Plan / ED Course  I have reviewed the triage vital signs and the nursing notes.  Pertinent labs & imaging results that were available during my care of the patient were reviewed by me and considered in my medical decision making (see chart for details).     Patient with toothache.  No gross abscess.  Exam unconcerning for Ludwig's angina or spread of infection.  Will treat with penicillin and pain medicine.  Urged patient to follow-up with dentist.     Final Clinical Impressions(s) / ED Diagnoses   Final diagnoses:  Pain, dental    ED Discharge Orders        Ordered    lidocaine (XYLOCAINE) 2 % solution  Every  3 hours PRN     07/01/17 1427    penicillin v potassium (VEETID) 500 MG tablet  4 times daily     07/01/17 1427         Maricela Kawahara A, PA-C 07/01/17 1429    Gerhard Munch, MD 07/01/17 1446

## 2017-08-08 ENCOUNTER — Emergency Department (HOSPITAL_COMMUNITY)
Admission: EM | Admit: 2017-08-08 | Discharge: 2017-08-08 | Disposition: A | Discharge status: Home / Self Care | Payer: Self-pay | Attending: Emergency Medicine | Admitting: Emergency Medicine

## 2017-08-08 ENCOUNTER — Other Ambulatory Visit: Payer: Self-pay

## 2017-08-08 ENCOUNTER — Encounter (HOSPITAL_COMMUNITY): Payer: Self-pay

## 2017-08-08 DIAGNOSIS — K0889 Other specified disorders of teeth and supporting structures: Secondary | ICD-10-CM

## 2017-08-08 DIAGNOSIS — I10 Essential (primary) hypertension: Secondary | ICD-10-CM | POA: Insufficient documentation

## 2017-08-08 DIAGNOSIS — K029 Dental caries, unspecified: Secondary | ICD-10-CM | POA: Insufficient documentation

## 2017-08-08 DIAGNOSIS — Z87891 Personal history of nicotine dependence: Secondary | ICD-10-CM | POA: Insufficient documentation

## 2017-08-08 MED ORDER — PENICILLIN V POTASSIUM 500 MG PO TABS: 500.0000 mg | ORAL_TABLET | Freq: Three times a day (TID) | ORAL | 0 refills | Status: DC

## 2017-08-08 MED ORDER — OXYCODONE-ACETAMINOPHEN 5-325 MG PO TABS
1.0000 | ORAL_TABLET | ORAL | Status: DC | PRN
  Administered 2017-08-08: 1 via ORAL
  Filled 2017-08-08: qty 1

## 2017-08-08 NOTE — Discharge Instructions (Signed)
1. Medications: Ibuprofen for pain, penicillin, usual home medications 2. Treatment: rest, drink plenty of fluids, take medications as prescribed 3. Follow Up: Please followup with dentistry within 1 week for discussion of your diagnoses and further evaluation after today's visit; if you do not have a primary care doctor use the resource guide provided to find one; Return to the ER for high fevers, difficulty breathing, difficulty swallowing or other concerning symptoms

## 2017-08-08 NOTE — ED Provider Notes (Signed)
[facility] Provider Note   CSN: [id number] Arrival date & time: 08/08/17  0112     History   Chief Complaint Chief Complaint  Patient presents with  . Dental Pain    HPI Pauline AusWillie E Kargbo is a 36 y.o. male with a hx of polysubstance abuse, chronic abdominal pain and cyclic vomiting syndrome presents to the Emergency Department complaining of gradual, persistent, progressively worsening right lower dental pain onset 2-3 days ago. Associated symptoms include broken tooth at the site of the pain.  No treatments prior to arrival.  Eating and drinking make the symptoms worse.  Nothing seems to make them better.  Patient cannot remember the last time that he saw a dentist.  He denies headache, neck pain, neck stiffness, chest pain abdominal pain, nausea, vomiting, vision changes, ear pain.    The history is provided by the patient and medical records. No language interpreter was used.    Past Medical History:  Diagnosis Date  . 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 Chronic abdominal pain   . Cyclical vomiting syndrome   . GASTRITIS 12/09/2009   Qualifier: Diagnosis of  By: Daphine DeutscherMartin FNP, Zena AmosNykedtra    . Gastroparesis   . HELICOBACTER PYLORI INFECTION 12/14/2009   Qualifier: Diagnosis of  By: Levon Hedgerraddock, Brenda    . Nausea and vomiting    chronic, recurrent  . Polysubstance abuse [facility](HCC)     Patient Active Problem List   Diagnosis Date Noted  . Uncontrollable vomiting 03/10/2016  . Anemia 03/10/2016  . Metatarsal fracture 12/22/2015  . Essential hypertension 12/22/2015  . Night sweats 01/21/2015  . GERD (gastroesophageal reflux disease) 01/21/2015  . Gastritis 12/19/2014  . Hyperbilirubinemia 12/19/2014  . Elevated bilirubin   . Nausea & vomiting 12/14/2014  . Severe tetrahydrocannabinol (THC) dependence (HCC) 12/14/2014  . Alcohol abuse 12/14/2014  . Cyclic vomiting syndrome 12/06/2014  .  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 Gastroparesis 05/11/2012  . Abdominal pain 05/06/2012  . Polysubstance abuse (HCC) 05/06/2012  . CHOLECYSTECTOMY, HX OF 11/25/2009    Past Surgical History:  Procedure Laterality Date  . CHOLECYSTECTOMY  11/2009       Home Medications    Prior to Admission medications   Medication Sig Start Date End Date Taking? Authorizing Provider  Capsaicin-Menthol-Methyl Sal (CAPSAICIN-METHYL SAL-MENTHOL) 0.025-1-12 % CREA Apply 1 inch topically 2 (two) times daily. 02/04/17   Alessio Bogan, Dahlia ClientHannah, PA-C  lidocaine (XYLOCAINE) 2 % solution Use as directed 15 mLs every 3 (three) hours as needed in the mouth or throat for mouth pain. 07/01/17   McDonald, Mia A, PA-C  lisinopril (PRINIVIL,ZESTRIL) 5 MG tablet Take 1 tablet (5 mg total) by mouth daily. 08/10/16   Jaclyn ShaggyAmao, Enobong, MD  metoCLOPramide (REGLAN) 10 MG tablet Take 1 tablet (10 mg total) by mouth every 6 (six) hours. Patient taking differently: Take 10 mg by mouth every 6 (six) hours as needed for nausea or vomiting.  05/08/16   Donnetta Hutchingook, Brian, MD  omeprazole (PRILOSEC) 20 MG capsule Take 1 capsule (20 mg total) by mouth daily. 08/10/16   Jaclyn ShaggyAmao, Enobong, MD  ondansetron (ZOFRAN) 8 MG tablet Take 1 tablet (8 mg total) by mouth every 8 (eight) hours as needed for nausea or vomiting. 08/10/16   Jaclyn ShaggyAmao, Enobong, MD  pantoprazole (PROTONIX) 20 MG tablet Take 1 tablet (20 mg total) by mouth daily. 02/04/17   Mariellen Blaney, Dahlia ClientHannah, PA-C  penicillin v  potassium (VEETID) 500 MG tablet Take 1 tablet (500 mg total) by mouth 3 (three) times daily. 08/08/17   Rainy Rothman, Dahlia ClientHannah, PA-C  promethazine (PHENERGAN) 25 MG suppository Place 1 suppository (25 mg total) rectally every 8 (eight) hours as needed for nausea or vomiting. 11/08/16   Jaclyn ShaggyAmao, Enobong, MD  promethazine (PHENERGAN) 25 MG tablet Take 1 tablet (25 mg total) by mouth every 6 (six) hours as needed for nausea or vomiting. 10/28/16    Jaynie CrumbleKirichenko, Tatyana, PA-C    Family History Family History  Problem Relation Age of Onset  . Other Father   . Diabetes Father   . Hypertension Father     Social History Social History   Tobacco Use  . Smoking status: Former Smoker    Years: 8.00    Types: Cigars    Last attempt to quit: 12/15/2014    Years since quitting: 2.6  . Smokeless tobacco: Never Used  Substance Use Topics  . Alcohol use: No    Alcohol/week: 0.0 oz    Comment: 12/16/2014 " I no Longer drink ,I quit about 2 years ago "  . Drug use: Yes    Types: Marijuana    Comment: christmas day     Allergies   Patient has no known allergies.   Review of Systems Review of Systems  Constitutional: Negative for appetite change, chills and fever.  HENT: Positive for dental problem. Negative for drooling, ear pain, facial swelling, nosebleeds, postnasal drip, rhinorrhea and trouble swallowing.   Eyes: Negative for pain and redness.  Respiratory: Negative for cough and wheezing.   Cardiovascular: Negative for chest pain.  Gastrointestinal: Negative for abdominal pain, nausea and vomiting.  Musculoskeletal: Negative for neck pain and neck stiffness.  Skin: Negative for color change and rash.  Neurological: Negative for weakness, light-headedness and headaches.  All other systems reviewed and are negative.    Physical Exam Updated Vital Signs BP (!) 140/99 (BP Location: Right Arm)   Pulse 72   Temp 98.2 F (36.8 C) (Oral)   Resp 14   Ht 5\' 9"  (1.753 m)   Wt 88 kg (194 lb)   SpO2 98%   BMI 28.65 kg/m   Physical Exam  Constitutional: He appears well-developed and well-nourished.  HENT:  Head: Normocephalic.  Right Ear: External ear normal.  Left Ear: External ear normal.  Nose: Nose normal.  Mouth/Throat: Uvula is midline, oropharynx is clear and moist and mucous membranes are normal. No oral lesions. Abnormal dentition. Dental caries present. No uvula swelling or lacerations. No oropharyngeal  exudate, posterior oropharyngeal edema, posterior oropharyngeal erythema or tonsillar abscesses.  Tooth # 30 with large dental carie; mild erythema of the surrounding gingiva No gross abscess No fluctuance or induration to the buccal mucosa or floor of the mouth  Eyes: Conjunctivae are normal. Right eye exhibits no discharge. Left eye exhibits no discharge.  Neck: Normal range of motion. Neck supple.  No stridor Handling secretions without difficulty No nuchal rigidity No cervical lymphadenopathy  Cardiovascular: Normal rate and regular rhythm.  Pulmonary/Chest: Effort normal. No respiratory distress.  Equal chest rise  Abdominal: He exhibits no distension. There is no tenderness.  Lymphadenopathy:    He has no cervical adenopathy.  Neurological: He is alert.  Skin: Skin is warm and dry.  Psychiatric: He has a normal mood and affect.  Nursing note and vitals reviewed.    ED Treatments / Results   Procedures Dental Block Date/Time: 08/08/2017 3:01 AM Performed by: Dierdre ForthMuthersbaugh, Alexis Reber, PA-C Authorized  by: Dierdre Forth, PA-C   Consent:    Consent obtained:  Verbal   Consent given by:  Patient   Risks discussed:  Infection, nerve damage, pain, unsuccessful block, allergic reaction, intravascular injection and hematoma   Alternatives discussed:  No treatment Indications:    Indications: dental pain   Location:    Block type:  Supraperiosteal   Supraperiosteal location:  Lower teeth   Lower teeth location:  30/RL 1st molar Procedure details (see MAR for exact dosages):    Syringe type:  Luer lock syringe   Needle gauge:  27 G   Anesthetic injected:  Bupivacaine 0.5% WITH epi   Injection procedure:  Anatomic landmarks identified, introduced needle, negative aspiration for blood and incremental injection Post-procedure details:    Outcome:  Anesthesia achieved   Patient tolerance of procedure:  Tolerated well, no immediate complications   (including critical care  time)  Medications Ordered in ED Medications  oxyCODONE-acetaminophen (PERCOCET/ROXICET) 5-325 MG per tablet 1 tablet (1 tablet Oral Given 08/08/17 0136)     Initial Impression / Assessment and Plan / ED Course  I have reviewed the triage vital signs and the nursing notes.  Pertinent labs & imaging results that were available during my care of the patient were reviewed by me and considered in my medical decision making (see chart for details).     Patient with toothache.  No gross abscess.  Exam unconcerning for Ludwig's angina or spread of infection.  Will treat with penicillin and anti-inflammatory medicine.  Dental block with complete pain control in the ED. Urged patient to follow-up with dentist.     Final Clinical Impressions(s) / ED Diagnoses   Final diagnoses:  Pain, dental  Dental caries    ED Discharge Orders        Ordered    penicillin v potassium (VEETID) 500 MG tablet  3 times daily     08/08/17 0302       Gryphon Vanderveen, Boyd Kerbs 08/08/17 0309    Ward, Layla Maw, DO 08/08/17 [phone]

## 2017-08-08 NOTE — ED Triage Notes (Signed)
Pt here for right lower dental pain since yesterday, reports no relief with aleve or oragel. Pt reports that he has not seen dentist

## 2017-11-01 ENCOUNTER — Other Ambulatory Visit: Payer: Self-pay

## 2017-11-01 ENCOUNTER — Encounter (HOSPITAL_COMMUNITY): Payer: Self-pay | Admitting: *Deleted

## 2017-11-01 ENCOUNTER — Emergency Department (HOSPITAL_COMMUNITY)
Admission: EM | Admit: 2017-11-01 | Discharge: 2017-11-01 | Disposition: A | Discharge status: Home / Self Care | Payer: Self-pay | Attending: Emergency Medicine | Admitting: Emergency Medicine

## 2017-11-01 DIAGNOSIS — G8929 Other chronic pain: Secondary | ICD-10-CM

## 2017-11-01 DIAGNOSIS — Z87891 Personal history of nicotine dependence: Secondary | ICD-10-CM | POA: Insufficient documentation

## 2017-11-01 DIAGNOSIS — F1220 Cannabis dependence, uncomplicated: Secondary | ICD-10-CM | POA: Insufficient documentation

## 2017-11-01 DIAGNOSIS — I10 Essential (primary) hypertension: Secondary | ICD-10-CM | POA: Insufficient documentation

## 2017-11-01 DIAGNOSIS — R109 Unspecified abdominal pain: Secondary | ICD-10-CM | POA: Insufficient documentation

## 2017-11-01 DIAGNOSIS — R1115 Cyclical vomiting syndrome unrelated to migraine: Secondary | ICD-10-CM

## 2017-11-01 DIAGNOSIS — Z79899 Other long term (current) drug therapy: Secondary | ICD-10-CM | POA: Insufficient documentation

## 2017-11-01 DIAGNOSIS — R112 Nausea with vomiting, unspecified: Secondary | ICD-10-CM | POA: Insufficient documentation

## 2017-11-01 LAB — CBC
HCT: 39.2 % (ref 39.0–52.0)
Hemoglobin: 13.2 g/dL (ref 13.0–17.0)
MCH: 32 pg (ref 26.0–34.0)
MCHC: 33.7 g/dL (ref 30.0–36.0)
MCV: 95.1 fL (ref 78.0–100.0)
Platelets: 251 10*3/uL (ref 150–400)
RBC: 4.12 MIL/uL — ABNORMAL LOW (ref 4.22–5.81)
RDW: 12.4 % (ref 11.5–15.5)
WBC: 25.5 10*3/uL — ABNORMAL HIGH (ref 4.0–10.5)

## 2017-11-01 LAB — URINALYSIS, ROUTINE W REFLEX MICROSCOPIC
Bacteria, UA: NONE SEEN
Bilirubin Urine: NEGATIVE
Glucose, UA: NEGATIVE mg/dL
Ketones, ur: 20 mg/dL — AB
Leukocytes, UA: NEGATIVE
Nitrite: NEGATIVE
Protein, ur: 100 mg/dL — AB
Specific Gravity, Urine: 1.039 — ABNORMAL HIGH (ref 1.005–1.030)
pH: 5 (ref 5.0–8.0)

## 2017-11-01 LAB — RAPID URINE DRUG SCREEN, HOSP PERFORMED
Amphetamines: NOT DETECTED
Barbiturates: NOT DETECTED
Benzodiazepines: NOT DETECTED
Cocaine: NOT DETECTED
Opiates: NOT DETECTED
Tetrahydrocannabinol: POSITIVE — AB

## 2017-11-01 LAB — COMPREHENSIVE METABOLIC PANEL
ALT: 27 U/L (ref 17–63)
AST: 35 U/L (ref 15–41)
Albumin: 4.5 g/dL (ref 3.5–5.0)
Alkaline Phosphatase: 72 U/L (ref 38–126)
Anion gap: 12 (ref 5–15)
BUN: 15 mg/dL (ref 6–20)
CO2: 25 mmol/L (ref 22–32)
Calcium: 10.2 mg/dL (ref 8.9–10.3)
Chloride: 103 mmol/L (ref 101–111)
Creatinine, Ser: 1.12 mg/dL (ref 0.61–1.24)
GFR calc Af Amer: >60 mL/min (ref >60)
GFR calc non Af Amer: >60 mL/min (ref >60)
Glucose, Bld: 105 mg/dL — ABNORMAL HIGH (ref 65–99)
Potassium: 4.5 mmol/L (ref 3.5–5.1)
Sodium: 140 mmol/L (ref 135–145)
Total Bilirubin: 1.7 mg/dL — ABNORMAL HIGH (ref 0.3–1.2)
Total Protein: 8.1 g/dL (ref 6.5–8.1)

## 2017-11-01 LAB — LIPASE, BLOOD: Lipase: 21 U/L (ref 11–51)

## 2017-11-01 MED ORDER — CAPSAICIN 0.025 % EX CREA
TOPICAL_CREAM | Freq: Once | CUTANEOUS | Status: AC
  Administered 2017-11-01: 23:00:00 via TOPICAL
  Filled 2017-11-01: qty 60

## 2017-11-01 MED ORDER — CAPSAICIN 0.025 % EX CREA
TOPICAL_CREAM | Freq: Two times a day (BID) | CUTANEOUS | Status: DC
  Filled 2017-11-01: qty 60

## 2017-11-01 MED ORDER — ONDANSETRON HCL 4 MG/2ML IJ SOLN
4.0000 mg | Freq: Once | INTRAMUSCULAR | Status: AC
  Administered 2017-11-01: 4 mg via INTRAVENOUS
  Filled 2017-11-01: qty 2

## 2017-11-01 MED ORDER — SODIUM CHLORIDE 0.9 % IV BOLUS (SEPSIS)
1000.0000 mL | Freq: Once | INTRAVENOUS | Status: AC
  Administered 2017-11-01: 1000 mL via INTRAVENOUS

## 2017-11-01 MED ORDER — HALOPERIDOL LACTATE 5 MG/ML IJ SOLN
5.0000 mg | Freq: Once | INTRAMUSCULAR | Status: AC
  Administered 2017-11-01: 5 mg via INTRAVENOUS
  Filled 2017-11-01: qty 1

## 2017-11-01 NOTE — Discharge Instructions (Signed)
Please avoid marijuana use as it can worsen your condition.  Use Capsaicin cream as needed as it may help with your abdominal discomfort.

## 2017-11-01 NOTE — ED Triage Notes (Signed)
The pt is c/o abd pain and vomiting no diarrhea since this am

## 2017-11-01 NOTE — ED Provider Notes (Signed)
[facility]   CSN: [id number] Arrival date & time: 11/01/17  1807     History   Chief Complaint Chief Complaint  Patient presents with  . Abdominal Pain    [name] is a 37 y.o. male.  HPI   37 year old male with history of polysubstance abuse, recurrent vomiting, gastroparesis, recurrent abdominal pain presenting to the ED for evaluation of abdominal pain.  Patient developed lower abdominal pain that started last night.  He described pain as a sharp crampy sensation to his lower abdomen, waxing waning, currently rates as 5 out of 10.  He endorsed nausea and vomiting is that he vomits every 30 minutes of yellow content.  He also has some loose stools.  Endorsed mild chills and sweats.  Symptom has been ongoing throughout the day today prompting him to come here.  He admits to using marijuana last night.  He did try taking a warm shower which provide some relief.  He has had prior cholecystectomy.  He denies chest pain, shortness of breath, productive cough, dysuria, hematuria, hematochezia or melena.  Denies any recent binge drinking.  Past Medical History:  Diagnosis Date  . 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 Chronic abdominal pain   . Cyclical vomiting syndrome   . GASTRITIS 12/09/2009   Qualifier: Diagnosis of  By: Daphine DeutscherMartin FNP, Zena AmosNykedtra    . Gastroparesis   . HELICOBACTER PYLORI INFECTION 12/14/2009   Qualifier: Diagnosis of  By: Levon Hedgerraddock, Brenda    . Nausea and vomiting    chronic, recurrent  . Polysubstance abuse [facility](HCC)     Patient Active Problem List   Diagnosis Date Noted  . Uncontrollable vomiting 03/10/2016  . Anemia 03/10/2016  . Metatarsal fracture 12/22/2015  . Essential hypertension 12/22/2015  . Night sweats 01/21/2015  . GERD (gastroesophageal reflux disease) 01/21/2015  . Gastritis 12/19/2014  . Hyperbilirubinemia 12/19/2014  . Elevated  bilirubin   . Nausea & vomiting 12/14/2014  . Severe tetrahydrocannabinol (THC) dependence (HCC) 12/14/2014  . Alcohol abuse 12/14/2014  . Cyclic vomiting syndrome 12/06/2014  . 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 Gastroparesis 05/11/2012  . Abdominal pain 05/06/2012  . Polysubstance abuse (HCC) 05/06/2012  . CHOLECYSTECTOMY, HX OF 11/25/2009    Past Surgical History:  Procedure Laterality Date  . CHOLECYSTECTOMY  11/2009       Home Medications    Prior to Admission medications   Medication Sig Start Date End Date Taking? Authorizing Provider  Capsaicin-Menthol-Methyl Sal (CAPSAICIN-METHYL SAL-MENTHOL) 0.025-1-12 % CREA Apply 1 inch topically 2 (two) times daily. 02/04/17   Muthersbaugh, Dahlia ClientHannah, PA-C  lidocaine (XYLOCAINE) 2 % solution Use as directed 15 mLs every 3 (three) hours as needed in the mouth or throat for mouth pain. 07/01/17   McDonald, Mia A, PA-C  lisinopril (PRINIVIL,ZESTRIL) 5 MG tablet Take 1 tablet (5 mg total) by mouth daily. 08/10/16   Hoy RegisterNewlin, Enobong, MD  metoCLOPramide (REGLAN) 10 MG tablet Take 1 tablet (10 mg total) by mouth every 6 (six) hours. Patient taking differently: Take 10 mg by mouth every 6 (six) hours as needed for nausea or vomiting.  05/08/16   Donnetta Hutchingook, Brian, MD  omeprazole (PRILOSEC) 20 MG capsule Take 1 capsule (20 mg total) by mouth daily. 08/10/16   Hoy RegisterNewlin, Enobong, MD  ondansetron (ZOFRAN) 8 MG tablet Take 1 tablet (8 mg total) by mouth every 8 (eight) hours as needed  for nausea or vomiting. 08/10/16   Hoy Register, MD  pantoprazole (PROTONIX) 20 MG tablet Take 1 tablet (20 mg total) by mouth daily. 02/04/17   Muthersbaugh, Dahlia Client, PA-C  penicillin v potassium (VEETID) 500 MG tablet Take 1 tablet (500 mg total) by mouth 3 (three) times daily. 08/08/17   Muthersbaugh, Dahlia Client, PA-C  promethazine (PHENERGAN) 25 MG suppository Place 1 suppository (25 mg total) rectally every 8 (eight) hours as needed for  nausea or vomiting. 11/08/16   Hoy Register, MD  promethazine (PHENERGAN) 25 MG tablet Take 1 tablet (25 mg total) by mouth every 6 (six) hours as needed for nausea or vomiting. 10/28/16   Jaynie Crumble, PA-C    Family History Family History  Problem Relation Age of Onset  . Other Father   . Diabetes Father   . Hypertension Father     Social History Social History   Tobacco Use  . Smoking status: Former Smoker    Years: 8.00    Types: Cigars    Last attempt to quit: 12/15/2014    Years since quitting: 2.8  . Smokeless tobacco: Never Used  Substance Use Topics  . Alcohol use: No    Alcohol/week: 0.0 oz    Comment: 12/16/2014 " I no Longer drink ,I quit about 2 years ago "  . Drug use: Yes    Types: Marijuana    Comment: christmas day     Allergies   Patient has no known allergies.   Review of Systems Review of Systems  All other systems reviewed and are negative.    Physical Exam Updated Vital Signs BP 123/66 (BP Location: Right Arm)   Pulse 64   Temp 98.5 F (36.9 C) (Oral)   Resp 18   Ht 5\' 10"  (1.778 m)   Wt 81.6 kg (180 lb)   SpO2 100%   BMI 25.83 kg/m   Physical Exam  Constitutional: He appears well-developed and well-nourished. No distress.  Patient is well-appearing, resting in bed, playing with his phone.  HENT:  Head: Atraumatic.  Eyes: Conjunctivae are normal.  Neck: Neck supple.  Cardiovascular: Normal rate and regular rhythm.  Pulmonary/Chest: Effort normal and breath sounds normal.  Abdominal: Soft. Normal appearance. There is tenderness in the right lower quadrant, suprapubic area and left lower quadrant. There is no tenderness at McBurney's point and negative Murphy's sign.  Neurological: He is alert.  Skin: No rash noted.  Psychiatric: He has a normal mood and affect.  Nursing note and vitals reviewed.    ED Treatments / Results  Labs (all labs ordered are listed, but only abnormal results are displayed) Labs Reviewed    COMPREHENSIVE METABOLIC PANEL - Abnormal; Notable for the following components:      Result Value   Glucose, Bld 105 (*)    Total Bilirubin 1.7 (*)    All other components within normal limits  CBC - Abnormal; Notable for the following components:   WBC 25.5 (*)    RBC 4.12 (*)    All other components within normal limits  URINALYSIS, ROUTINE W REFLEX MICROSCOPIC - Abnormal; Notable for the following components:   APPearance HAZY (*)    Specific Gravity, Urine 1.039 (*)    Hgb urine dipstick SMALL (*)    Ketones, ur 20 (*)    Protein, ur 100 (*)    Squamous Epithelial / LPF 0-5 (*)    All other components within normal limits  RAPID URINE DRUG SCREEN, HOSP PERFORMED - Abnormal; Notable for  the following components:   Tetrahydrocannabinol POSITIVE (*)    All other components within normal limits  LIPASE, BLOOD    EKG  EKG Interpretation None       Radiology No results found.  Procedures Procedures (including critical care time)  Medications Ordered in ED Medications  capsaicin (ZOSTRIX) 0.025 % cream (not administered)  haloperidol lactate (HALDOL) injection 5 mg (5 mg Intravenous Given 11/01/17 2101)  sodium chloride 0.9 % bolus 1,000 mL (1,000 mLs Intravenous New Bag/Given 11/01/17 2101)  ondansetron (ZOFRAN) injection 4 mg (4 mg Intravenous Given 11/01/17 2101)     Initial Impression / Assessment and Plan / ED Course  I have reviewed the triage vital signs and the nursing notes.  Pertinent labs & imaging results that were available during my care of the patient were reviewed by me and considered in my medical decision making (see chart for details).     BP 123/66 (BP Location: Right Arm)   Pulse 64   Temp 98.5 F (36.9 C) (Oral)   Resp 18   Ht 5\' 10"  (1.778 m)   Wt 81.6 kg (180 lb)   SpO2 100%   BMI 25.83 kg/m    Final Clinical Impressions(s) / ED Diagnoses   Final diagnoses:  Chronic abdominal pain  Marijuana dependence (HCC)  Non-intractable  cyclical vomiting with nausea    ED Discharge Orders    None     8:53 PM Patient with history of chronic abdominal pain, cyclical vomiting syndrome, and marijuana use.  He is here with lower abdominal pain, nausea vomiting similar to prior symptoms.  Suspect Cannabinoid hyperemesis syndrome.  He was last seen in ED last year for the same complaint, normal abd/pelvis CT scan at that time.  He does not have a surgical abdomen.  Will provide symptomatic treatment including IV fluids, Haldol, and capsaicin cream along with antinausea medication.  10:57 PM Patient report improvement of symptoms after receiving treatment.  No active vomiting..  Stable for discharge.  Encourage patient to avoid marijuana use as it can worsen his condition.  Recommend capsaicin cream as needed for his abdominal pain.  Return precautions discussed.  He does have an elevated white count of 25.5 likely stress demargination. He does have elevated WBC in the past with normal CTs.  I have low suspicion for appendicitis at this time.  His pain is minimal on reexamination.  Patient does understand to return if his condition worsen.   Fayrene Helper, PA-C 11/01/17 1610    Linwood Dibbles, MD 11/02/17 [phone]

## 2017-11-01 NOTE — ED Notes (Addendum)
Pt to ED via EMS for eval of abd pain- n/v after marijuana use.

## 2017-11-01 NOTE — ED Notes (Signed)
Called lab to add-on urine drug screen.

## 2017-11-04 ENCOUNTER — Encounter (HOSPITAL_COMMUNITY): Payer: Self-pay | Admitting: Radiology

## 2017-11-04 ENCOUNTER — Emergency Department (HOSPITAL_COMMUNITY): Payer: Self-pay

## 2017-11-04 ENCOUNTER — Emergency Department (HOSPITAL_COMMUNITY)
Admission: EM | Admit: 2017-11-04 | Discharge: 2017-11-04 | Disposition: A | Discharge status: Home / Self Care | Payer: Self-pay | Attending: Emergency Medicine | Admitting: Emergency Medicine

## 2017-11-04 DIAGNOSIS — R1030 Lower abdominal pain, unspecified: Secondary | ICD-10-CM | POA: Insufficient documentation

## 2017-11-04 DIAGNOSIS — R112 Nausea with vomiting, unspecified: Secondary | ICD-10-CM | POA: Insufficient documentation

## 2017-11-04 DIAGNOSIS — I10 Essential (primary) hypertension: Secondary | ICD-10-CM | POA: Insufficient documentation

## 2017-11-04 DIAGNOSIS — R945 Abnormal results of liver function studies: Secondary | ICD-10-CM | POA: Insufficient documentation

## 2017-11-04 DIAGNOSIS — R748 Abnormal levels of other serum enzymes: Secondary | ICD-10-CM

## 2017-11-04 DIAGNOSIS — R030 Elevated blood-pressure reading, without diagnosis of hypertension: Secondary | ICD-10-CM

## 2017-11-04 DIAGNOSIS — Z87891 Personal history of nicotine dependence: Secondary | ICD-10-CM | POA: Insufficient documentation

## 2017-11-04 LAB — CBC WITH DIFFERENTIAL/PLATELET
Basophils Absolute: 0 10*3/uL (ref 0.0–0.1)
Basophils Relative: 0 %
Eosinophils Absolute: 0 10*3/uL (ref 0.0–0.7)
Eosinophils Relative: 0 %
HCT: 40.3 % (ref 39.0–52.0)
Hemoglobin: 13.9 g/dL (ref 13.0–17.0)
Lymphocytes Relative: 13 %
Lymphs Abs: 1.8 10*3/uL (ref 0.7–4.0)
MCH: 32.3 pg (ref 26.0–34.0)
MCHC: 34.5 g/dL (ref 30.0–36.0)
MCV: 93.5 fL (ref 78.0–100.0)
Monocytes Absolute: 1.2 10*3/uL — ABNORMAL HIGH (ref 0.1–1.0)
Monocytes Relative: 9 %
Neutro Abs: 10.4 10*3/uL — ABNORMAL HIGH (ref 1.7–7.7)
Neutrophils Relative %: 78 %
Platelets: 238 10*3/uL (ref 150–400)
RBC: 4.31 MIL/uL (ref 4.22–5.81)
RDW: 11.8 % (ref 11.5–15.5)
WBC: 13.4 10*3/uL — ABNORMAL HIGH (ref 4.0–10.5)

## 2017-11-04 LAB — URINALYSIS, ROUTINE W REFLEX MICROSCOPIC
Bacteria, UA: NONE SEEN
Bilirubin Urine: NEGATIVE
Glucose, UA: NEGATIVE mg/dL
Ketones, ur: 20 mg/dL — AB
Leukocytes, UA: NEGATIVE
Nitrite: NEGATIVE
Protein, ur: 100 mg/dL — AB
Specific Gravity, Urine: 1.035 — ABNORMAL HIGH (ref 1.005–1.030)
Squamous Epithelial / LPF: NONE SEEN
pH: 5 (ref 5.0–8.0)

## 2017-11-04 LAB — COMPREHENSIVE METABOLIC PANEL
ALT: 39 U/L (ref 17–63)
AST: 107 U/L — ABNORMAL HIGH (ref 15–41)
Albumin: 4.3 g/dL (ref 3.5–5.0)
Alkaline Phosphatase: 69 U/L (ref 38–126)
Anion gap: 17 — ABNORMAL HIGH (ref 5–15)
BUN: 18 mg/dL (ref 6–20)
CO2: 26 mmol/L (ref 22–32)
Calcium: 9.8 mg/dL (ref 8.9–10.3)
Chloride: 90 mmol/L — ABNORMAL LOW (ref 101–111)
Creatinine, Ser: 1.06 mg/dL (ref 0.61–1.24)
GFR calc Af Amer: >60 mL/min (ref >60)
GFR calc non Af Amer: >60 mL/min (ref >60)
Glucose, Bld: 100 mg/dL — ABNORMAL HIGH (ref 65–99)
Potassium: 3.4 mmol/L — ABNORMAL LOW (ref 3.5–5.1)
Sodium: 133 mmol/L — ABNORMAL LOW (ref 135–145)
Total Bilirubin: 3.1 mg/dL — ABNORMAL HIGH (ref 0.3–1.2)
Total Protein: 7.6 g/dL (ref 6.5–8.1)

## 2017-11-04 MED ORDER — DICYCLOMINE HCL 20 MG PO TABS: 20.0000 mg | ORAL_TABLET | Freq: Two times a day (BID) | ORAL | 0 refills | Status: DC

## 2017-11-04 MED ORDER — PROMETHAZINE HCL 25 MG RE SUPP: 25.0000 mg | Freq: Three times a day (TID) | RECTAL | 0 refills | Status: DC | PRN

## 2017-11-04 MED ORDER — PROMETHAZINE HCL 25 MG/ML IJ SOLN
25.0000 mg | Freq: Once | INTRAMUSCULAR | Status: AC
  Administered 2017-11-04: 25 mg via INTRAVENOUS
  Filled 2017-11-04: qty 1

## 2017-11-04 MED ORDER — KETOROLAC TROMETHAMINE 30 MG/ML IJ SOLN
30.0000 mg | Freq: Once | INTRAMUSCULAR | Status: AC
Start: 2017-11-04 — End: 2017-11-04
  Administered 2017-11-04: 30 mg via INTRAVENOUS
  Filled 2017-11-04: qty 1

## 2017-11-04 MED ORDER — POTASSIUM CHLORIDE CRYS ER 20 MEQ PO TBCR: 20.0000 meq | EXTENDED_RELEASE_TABLET | Freq: Every day | ORAL | 0 refills | Status: DC

## 2017-11-04 MED ORDER — HALOPERIDOL LACTATE 5 MG/ML IJ SOLN
2.0000 mg | Freq: Once | INTRAMUSCULAR | Status: AC
  Administered 2017-11-04: 2 mg via INTRAVENOUS
  Filled 2017-11-04: qty 1

## 2017-11-04 MED ORDER — POTASSIUM CHLORIDE CRYS ER 20 MEQ PO TBCR
40.0000 meq | EXTENDED_RELEASE_TABLET | Freq: Once | ORAL | Status: DC
  Filled 2017-11-04: qty 2

## 2017-11-04 MED ORDER — CAPSAICIN-MENTHOL-METHYL SAL 0.025-1-12 % EX CREA: 1.0000 [in_us] | TOPICAL_CREAM | Freq: Two times a day (BID) | CUTANEOUS | 0 refills | Status: DC

## 2017-11-04 MED ORDER — DICYCLOMINE HCL 10 MG/ML IM SOLN
20.0000 mg | Freq: Once | INTRAMUSCULAR | Status: AC
  Administered 2017-11-04: 20 mg via INTRAMUSCULAR
  Filled 2017-11-04: qty 2

## 2017-11-04 MED ORDER — PROMETHAZINE HCL 25 MG RE SUPP: 25.0000 mg | Freq: Three times a day (TID) | RECTAL | 1 refills | Status: DC | PRN

## 2017-11-04 MED ORDER — SODIUM CHLORIDE 0.9 % IV BOLUS (SEPSIS)
1000.0000 mL | Freq: Once | INTRAVENOUS | Status: AC
  Administered 2017-11-04: 1000 mL via INTRAVENOUS

## 2017-11-04 MED ORDER — IOPAMIDOL (ISOVUE-300) INJECTION 61%
INTRAVENOUS | Status: AC
  Administered 2017-11-04: 100 mL
  Filled 2017-11-04: qty 100

## 2017-11-04 NOTE — ED Notes (Signed)
Pt took self off of monitor.

## 2017-11-04 NOTE — ED Notes (Signed)
Pt states had not have a bowel movement in 5 days. Extensive GI hx.

## 2017-11-04 NOTE — ED Provider Notes (Signed)
[facility] EMERGENCY DEPARTMENT Provider Note   CSN: [id number] Arrival date & time: 11/04/17  0503     History   Chief Complaint No chief complaint on file.   [name] is a 37 y.o. male.  HPI  Patient is a 37 year old male with a history of biliary dyskinesia, H. pylori infection, polysubstance use, cannabis use, and recurrent vomiting abdominal pain and abdominal surgical history significant for cholecystectomy presenting for lower abdominal pain, nausea, and vomiting for 3 days.  Patient reports that this current episode feels consistent with prior episodes of his acute abdominal pain, however it is lasting longer in duration than previously.  Patient reports that his lower abdominal pain is colicky in nature, and at different times achy, dull, crampy, and sharp.  Patient reports anytime he tries to take p.o. intake, he vomits.  Patient denies hematemesis or bilious vomiting.  Patient denies recorded fevers, but reports he has been diaphoretic.  Patient denies diarrhea.  Last bowel movement unknown to patient, but 5+ days ago.  Patient reports he is typically has regular bowel movements.  Patient reports he is passing flatus.  No remedies tried at home for symptoms.  Past Medical History:  Diagnosis Date  . 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 Chronic abdominal pain   . Cyclical vomiting syndrome   . GASTRITIS 12/09/2009   Qualifier: Diagnosis of  By: Daphine Deutscher FNP, Zena Amos    . Gastroparesis   . 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 Nausea and vomiting    chronic, recurrent  . Polysubstance abuse [facility])     Patient Active Problem List   Diagnosis Date Noted  . Uncontrollable vomiting 03/10/2016  . Anemia 03/10/2016  . Metatarsal fracture 12/22/2015  . Essential hypertension 12/22/2015  . Night sweats 01/21/2015  . GERD (gastroesophageal  reflux disease) 01/21/2015  . Gastritis 12/19/2014  . Hyperbilirubinemia 12/19/2014  . Elevated bilirubin   . Nausea & vomiting 12/14/2014  . Severe tetrahydrocannabinol (THC) dependence (HCC) 12/14/2014  . Alcohol abuse 12/14/2014  . Cyclic vomiting syndrome 12/06/2014  . 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 Gastroparesis 05/11/2012  . Abdominal pain 05/06/2012  . Polysubstance abuse (HCC) 05/06/2012  . CHOLECYSTECTOMY, HX OF 11/25/2009    Past Surgical History:  Procedure Laterality Date  . CHOLECYSTECTOMY  11/2009        Home Medications    Prior to Admission medications   Medication Sig Start Date End Date Taking? Authorizing Provider  Capsaicin-Menthol-Methyl Sal (CAPSAICIN-METHYL SAL-MENTHOL) 0.025-1-12 % CREA Apply 1 inch topically 2 (two) times daily. Patient not taking: Reported on 11/04/2017 02/04/17   Muthersbaugh, Dahlia Client, PA-C  lidocaine (XYLOCAINE) 2 % solution Use as directed 15 mLs every 3 (three) hours as needed in the mouth or throat for mouth pain. Patient not taking: Reported on 11/04/2017 07/01/17   McDonald, Pedro Earls A, PA-C  lisinopril (PRINIVIL,ZESTRIL) 5 MG tablet Take 1 tablet (5 mg total) by mouth daily. Patient not taking: Reported on 11/04/2017 08/10/16   Hoy Register, MD  metoCLOPramide (REGLAN) 10 MG tablet Take 1 tablet (10 mg total) by mouth every 6 (six) hours. Patient not taking: Reported on 11/04/2017 05/08/16   Donnetta Hutching, MD  omeprazole (PRILOSEC) 20 MG capsule Take 1 capsule (20 mg total) by mouth daily. Patient not taking: Reported on 11/04/2017 08/10/16   Hoy Register, MD  ondansetron (ZOFRAN) 8 MG tablet Take 1 tablet (8 mg total) by mouth every 8 (eight) hours as needed for nausea or vomiting. Patient not taking: Reported on 11/04/2017 08/10/16   Hoy RegisterNewlin, Enobong, MD  pantoprazole (PROTONIX) 20 MG tablet Take 1 tablet (20 mg total) by mouth daily. Patient not taking: Reported on 11/04/2017 02/04/17    Muthersbaugh, Dahlia ClientHannah, PA-C  penicillin v potassium (VEETID) 500 MG tablet Take 1 tablet (500 mg total) by mouth 3 (three) times daily. Patient not taking: Reported on 11/04/2017 08/08/17   Muthersbaugh, Dahlia ClientHannah, PA-C  promethazine (PHENERGAN) 25 MG suppository Place 1 suppository (25 mg total) rectally every 8 (eight) hours as needed for nausea or vomiting. Patient not taking: Reported on 11/04/2017 11/08/16   Hoy RegisterNewlin, Enobong, MD  promethazine (PHENERGAN) 25 MG tablet Take 1 tablet (25 mg total) by mouth every 6 (six) hours as needed for nausea or vomiting. Patient not taking: Reported on 11/04/2017 10/28/16   Jaynie CrumbleKirichenko, Tatyana, PA-C    Family History Family History  Problem Relation Age of Onset  . Other Father   . Diabetes Father   . Hypertension Father     Social History Social History   Tobacco Use  . Smoking status: Former Smoker    Years: 8.00    Types: Cigars    Last attempt to quit: 12/15/2014    Years since quitting: 2.8  . Smokeless tobacco: Never Used  Substance Use Topics  . Alcohol use: No    Alcohol/week: 0.0 oz    Comment: 12/16/2014 " I no Longer drink ,I quit about 2 years ago "  . Drug use: Yes    Types: Marijuana    Comment: christmas day     Allergies   Patient has no known allergies.   Review of Systems Review of Systems  Constitutional: Negative for chills and fever.  HENT: Positive for rhinorrhea. Negative for congestion.   Respiratory: Positive for cough. Negative for shortness of breath.   Cardiovascular: Negative for chest pain and palpitations.  Gastrointestinal: Positive for abdominal pain, constipation, nausea and vomiting. Negative for blood in stool and diarrhea.  Genitourinary: Negative for discharge, dysuria and flank pain.  Musculoskeletal: Negative for back pain and myalgias.  Skin: Negative for rash.  Neurological: Negative for dizziness and headaches.  All other systems reviewed and are negative.    Physical Exam Updated Vital  Signs BP (!) 154/112   Pulse (!) 103   Resp 17   Ht 5\' 9"  (1.753 m)   Wt 83.9 kg (185 lb)   SpO2 96%   BMI 27.32 kg/m   Physical Exam  Constitutional: He appears well-developed and well-nourished. No distress.  HENT:  Head: Normocephalic and atraumatic.  Mouth/Throat: Oropharynx is clear and moist.  Eyes: Pupils are equal, round, and reactive to light. Conjunctivae and EOM are normal.  Neck: Normal range of motion. Neck supple.  Cardiovascular: Normal rate, regular rhythm, S1 normal and S2 normal.  No murmur heard. Pulmonary/Chest: Effort normal and breath sounds normal. He has no wheezes. He has no rales.  Abdominal: Soft. He exhibits no distension. There is tenderness. There is no guarding.  Tenderness to palpation in a bandlike pattern across the lower abdomen.  No peritoneal signs.  Patient is tender in both right lower and left lower quadrant.  Musculoskeletal: Normal range of motion. He exhibits no edema or deformity.  Lymphadenopathy:    He has no cervical adenopathy.  Neurological: He is alert.  Cranial nerves grossly intact. Patient moves extremities  symmetrically and with good coordination.  Skin: Skin is warm and dry. No rash noted. No erythema.  Psychiatric: He has a normal mood and affect. His behavior is normal. Judgment and thought content normal.  Nursing note and vitals reviewed.    ED Treatments / Results  Labs (all labs ordered are listed, but only abnormal results are displayed) Labs Reviewed  COMPREHENSIVE METABOLIC PANEL  CBC WITH DIFFERENTIAL/PLATELET  URINALYSIS, ROUTINE W REFLEX MICROSCOPIC    EKG None  Radiology No results found.  Procedures Procedures (including critical care time)  Medications Ordered in ED Medications  promethazine (PHENERGAN) injection 25 mg (has no administration in time range)  dicyclomine (BENTYL) injection 20 mg (has no administration in time range)     Initial Impression / Assessment and Plan / ED Course    I have reviewed the triage vital signs and the nursing notes.  Pertinent labs & imaging results that were available during my care of the patient were reviewed by me and considered in my medical decision making (see chart for details).  Clinical Course as of Nov 05 1715  Sat Nov 04, 2017  0719 Hematuria noted. Change from small hemoglobin in urine from 3 days ago.   [AM]  0945 Patient reassessed.  Patient is symptomatic of nausea at this time, but per nurse, patient has had "dry heaving".  Will provide 2 mg of Haldol given patient success with this medication in the past.   [AM]  1116 Patient reassessed.  Patient is having continued emesis.  We will proceed with additional 2 mg of Haldol and Toradol.   [AM]  1303 Reassessed. Resting comfortably.    [AM]    Clinical Course User Index [AM] Elisha Ponder, PA-C    Patient is nontoxic-appearing, afebrile, and not tachycardic, verified by me in room.  Patient with extensive gastrointestinal history as well as recurrent abdominal pain.  Patient was seen 3 days ago for the same.  Given that patient had leukocytosis of 25 at that time, history presenting for persistent lower abdominal pain, will obtain CT abdomen and pelvis with contrast at this time.  Differential diagnosis includes appendicitis, diverticulitis, nephrolithiasis, small bowel obstruction, gastroenteritis, cannabis hyperemesis syndrome.  CT scan showing no acute finding in the abdomen and pelvis to explain patient's lower abdominal pain persisting.  Right upper quadrant ultrasound showing fatty infiltration along falciform ligament consistent with CT.  Stable lucencies bilateral iliac bones.  Lab work significant for slight hypokalemia of 3.4 and asymptomatic hyponatremia at 133.  Additionally, patient has AST elevation of 107 that is elevated from prior value.  Unclear source, right upper quadrant ultrasound and CT revealed no biliary obstruction, and alkaline phosphatase is  normal.  Patient has had transient elevations in AST historically.  Total bilirubin 3.1, and patient has also had transient elevation of this in the past.  Slight leukocytosis at 13.4 with neutrophil predominance.  Feel that this is reactive.  Discussed with the patient that he will require follow-up given his hematuria.  There is no kidney stone to suggest traumatic cause of this.  Multiple anti-emetics required but patient tolerating p.o. and ambulatory at time of discharge.  Patient discharged with a rectal suppository for Phenergan, capsaicin-menthol cream, and instructions to follow-up with both gastroneurology and his primary care provider as soon as possible.  Return precautions given for any intractable nausea or vomiting, worsening abdominal pain, fever abdominal pain, or new or worsening symptoms.  Patient is in understanding and agrees with plan of care.  Final Clinical Impressions(s) / ED Diagnoses   Final diagnoses:  Elevated liver enzymes  Lower abdominal pain  Non-intractable vomiting with nausea, unspecified vomiting type  Elevated blood pressure reading without diagnosis of hypertension     Delia Chimes 11/04/17 1721    [name], MD 11/04/17 2245

## 2017-11-04 NOTE — Discharge Instructions (Addendum)
Please read and follow all provided instructions.  Your diagnoses today include:  1. Lower abdominal pain   2. Elevated liver enzymes   3. Non-intractable vomiting with nausea, unspecified vomiting type    Within 1 month, you need to have your liver enzymes rechecked, as well as a repeat of your urine.  It is very important that she follow-up with her primary care provider as soon as possible.  You definitely need to see a gastroenterologist, and she may be able to facilitate this.  I provided a referral on your paperwork.  Tests performed today include: Blood counts and electrolytes Blood tests to check liver and kidney function Blood tests to check pancreas function Urine test to look for infection and pregnancy (in women) Vital signs. See below for your results today.   Medications prescribed:   Take any prescribed medications only as directed.  Please use the rectal Phenergan as needed for nausea and vomiting.  This is not to be taken orally.  You were also prescribed some capsaicin menthol cream.  This is applied to the abdomen twice daily and a 1 inch ribbon.  Please take your potassium supplementation for 3 days.  Bentyl.  This is an antispasmodic agent.  You may take this twice daily for abdominal pain.  Home care instructions:  Follow any educational materials contained in this packet.  Your abdominal pain, nausea, vomiting, and diarrhea may be caused by a viral gastroenteritis also called 'stomach flu'. You should rest for the next several days. Keep drinking plenty of fluids and use the medicine for nausea as directed.   Drink clear liquids for the next 24 hours and introduce solid foods slowly after 24 hours using the b.r.a.t. diet (Bananas, Rice, Applesauce, Toast, Yogurt).    Follow-up instructions: Please follow-up with your primary care provider in the next 2 days for further evaluation of your symptoms. If you are not feeling better in 48 hours you may have a  condition that is more serious and you need re-evaluation.   Return instructions:  SEEK IMMEDIATE MEDICAL ATTENTION IF: If you have pain that does not go away or becomes severe  A temperature above 101F develops  Repeated vomiting occurs (multiple episodes)  If you have pain that becomes localized to portions of the abdomen. The right side could possibly be appendicitis. In an adult, the left lower portion of the abdomen could be colitis or diverticulitis.  Blood is being passed in stools or vomit (bright red or black tarry stools)  You develop chest pain, difficulty breathing, dizziness or fainting, or become confused, poorly responsive, or inconsolable (young children) If you have any other emergent concerns regarding your health  Additional Information: Abdominal (belly) pain can be caused by many things. Your caregiver performed an examination and possibly ordered blood/urine tests and imaging (CT scan, x-rays, ultrasound). Many cases can be observed and treated at home after initial evaluation in the emergency department. Even though you are being discharged home, abdominal pain can be unpredictable. Therefore, you need a repeated exam if your pain does not resolve, returns, or worsens. Most patients with abdominal pain don't have to be admitted to the hospital or have surgery, but serious problems like appendicitis and gallbladder attacks can start out as nonspecific pain. Many abdominal conditions cannot be diagnosed in one visit, so follow-up evaluations are very important.  Your vital signs today were: BP (!) 155/87    Pulse 65    Resp 14    Ht 5\' 9"  (  1.753 m)    Wt 83.9 kg (185 lb)    SpO2 97%    BMI 27.32 kg/m  If your blood pressure (bp) was elevated above 135/85 this visit, please have this repeated by your doctor within one month. --------------

## 2017-11-04 NOTE — ED Notes (Signed)
Patient transported to Ultrasound 

## 2017-11-04 NOTE — ED Triage Notes (Addendum)
Pt complaint of abdominal pain , nausea and vomiting x three days on and off. Pt states has not been able to fill prescriptions for nausea  meds.

## 2017-11-08 ENCOUNTER — Emergency Department (HOSPITAL_COMMUNITY)
Admission: EM | Admit: 2017-11-08 | Discharge: 2017-11-08 | Disposition: A | Discharge status: Home / Self Care | Payer: Self-pay | Attending: Emergency Medicine | Admitting: Emergency Medicine

## 2017-11-08 ENCOUNTER — Encounter (HOSPITAL_COMMUNITY): Payer: Self-pay | Admitting: Family Medicine

## 2017-11-08 DIAGNOSIS — F17290 Nicotine dependence, other tobacco product, uncomplicated: Secondary | ICD-10-CM | POA: Insufficient documentation

## 2017-11-08 DIAGNOSIS — F1290 Cannabis use, unspecified, uncomplicated: Secondary | ICD-10-CM

## 2017-11-08 DIAGNOSIS — F12988 Cannabis use, unspecified with other cannabis-induced disorder: Secondary | ICD-10-CM | POA: Insufficient documentation

## 2017-11-08 DIAGNOSIS — Z79899 Other long term (current) drug therapy: Secondary | ICD-10-CM | POA: Insufficient documentation

## 2017-11-08 DIAGNOSIS — R112 Nausea with vomiting, unspecified: Secondary | ICD-10-CM

## 2017-11-08 LAB — URINALYSIS, ROUTINE W REFLEX MICROSCOPIC
Bilirubin Urine: NEGATIVE
Glucose, UA: NEGATIVE mg/dL
Hgb urine dipstick: NEGATIVE
Ketones, ur: NEGATIVE mg/dL
Leukocytes, UA: NEGATIVE
Nitrite: NEGATIVE
Protein, ur: NEGATIVE mg/dL
Specific Gravity, Urine: 1.004 — ABNORMAL LOW (ref 1.005–1.030)
pH: 7 (ref 5.0–8.0)

## 2017-11-08 LAB — CBC
HCT: 40.3 % (ref 39.0–52.0)
Hemoglobin: 13.6 g/dL (ref 13.0–17.0)
MCH: 32.1 pg (ref 26.0–34.0)
MCHC: 33.7 g/dL (ref 30.0–36.0)
MCV: 95 fL (ref 78.0–100.0)
Platelets: 297 10*3/uL (ref 150–400)
RBC: 4.24 MIL/uL (ref 4.22–5.81)
RDW: 12 % (ref 11.5–15.5)
WBC: 11.4 10*3/uL — ABNORMAL HIGH (ref 4.0–10.5)

## 2017-11-08 LAB — COMPREHENSIVE METABOLIC PANEL
ALT: 41 U/L (ref 17–63)
AST: 41 U/L (ref 15–41)
Albumin: 4.6 g/dL (ref 3.5–5.0)
Alkaline Phosphatase: 64 U/L (ref 38–126)
Anion gap: 15 (ref 5–15)
BUN: 14 mg/dL (ref 6–20)
CO2: 24 mmol/L (ref 22–32)
Calcium: 9.8 mg/dL (ref 8.9–10.3)
Chloride: 100 mmol/L — ABNORMAL LOW (ref 101–111)
Creatinine, Ser: 0.9 mg/dL (ref 0.61–1.24)
GFR calc Af Amer: >60 mL/min (ref >60)
GFR calc non Af Amer: >60 mL/min (ref >60)
Glucose, Bld: 140 mg/dL — ABNORMAL HIGH (ref 65–99)
Potassium: 3.7 mmol/L (ref 3.5–5.1)
Sodium: 139 mmol/L (ref 135–145)
Total Bilirubin: 1.7 mg/dL — ABNORMAL HIGH (ref 0.3–1.2)
Total Protein: 8.1 g/dL (ref 6.5–8.1)

## 2017-11-08 LAB — LIPASE, BLOOD: Lipase: 32 U/L (ref 11–51)

## 2017-11-08 MED ORDER — HALOPERIDOL LACTATE 5 MG/ML IJ SOLN
2.0000 mg | Freq: Once | INTRAMUSCULAR | Status: AC
  Administered 2017-11-08: 2 mg via INTRAVENOUS
  Filled 2017-11-08: qty 1

## 2017-11-08 MED ORDER — ONDANSETRON 4 MG PO TBDP
4.0000 mg | ORAL_TABLET | Freq: Once | ORAL | Status: AC | PRN
  Administered 2017-11-08: 4 mg via ORAL
  Filled 2017-11-08: qty 1

## 2017-11-08 MED ORDER — SODIUM CHLORIDE 0.9 % IV BOLUS
1000.0000 mL | Freq: Once | INTRAVENOUS | Status: AC
  Administered 2017-11-08: 1000 mL via INTRAVENOUS

## 2017-11-08 MED ORDER — CAPSAICIN 0.025 % EX CREA
TOPICAL_CREAM | Freq: Once | CUTANEOUS | Status: AC
  Administered 2017-11-08: 17:00:00 via TOPICAL
  Filled 2017-11-08: qty 60

## 2017-11-08 NOTE — ED Triage Notes (Addendum)
[facility] is from home [facility] Patient is complaining of lower abd pain with nausea and vomiting. Symptoms started last night, around 14 hours ago.Patient reports the pain started after eating MM's.  Patient vomited about 9 times.

## 2017-11-08 NOTE — ED Notes (Signed)
Patient aware we need urine sample.  

## 2017-11-08 NOTE — Discharge Instructions (Signed)
Please read attached information regarding your condition. Take the medications that were prescribed to you at your previous visit to help with your nausea. Follow-up with your primary care provider for further evaluation. Return to ED for worsening symptoms, vomiting up blood, severe abdominal pain with fever, lightheadedness or loss of consciousness.

## 2017-11-08 NOTE — ED Notes (Signed)
Placed water and crackers at bedside. Patient aware of fluid/po challenge.

## 2017-11-08 NOTE — ED Provider Notes (Signed)
[facility]-EMERGENCY DEPT Provider Note   CSN: [id number] Arrival date & time: 11/08/17  1319     History   Chief Complaint Chief Complaint  Patient presents with  . Abdominal Pain    [name] is a 37 y.o. male with past medical history of polysubstance abuse, H. pylori infection, chronic want to use, cyclical vomiting syndrome, who presents to ED for evaluation of 2-day history of cramping generalized, lower abdominal pain and several episodes of NBNB emesis.  States that his symptoms got better when he was discharged from the ED 4 days ago after having a diagnosis of cyclical vomiting syndrome due to marijuana use.  However, he was unable to get his prescriptions filled due to cost so he has only been taking Tylenol PM with mild improvement in his abdominal pain.  He states that today the pain did improve but he is continued to have vomiting and nausea.  He reports ongoing daily marijuana use.  Denies any recent abdominal surgeries, diarrhea, constipation, blood in stool, hemoptysis, dysuria, hematuria, fevers, sick contacts with similar symptoms, chest pain, shortness of breath.    HPI  Past Medical History:  Diagnosis Date  . 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 Chronic abdominal pain   . Cyclical vomiting syndrome   . GASTRITIS 12/09/2009   Qualifier: Diagnosis of  By: Daphine DeutscherMartin FNP, Zena AmosNykedtra    . Gastroparesis   . HELICOBACTER PYLORI INFECTION 12/14/2009   Qualifier: Diagnosis of  By: Levon Hedgerraddock, Brenda    . Nausea and vomiting    chronic, recurrent  . Polysubstance abuse [facility](HCC)     Patient Active Problem List   Diagnosis Date Noted  . Uncontrollable vomiting 03/10/2016  . Anemia 03/10/2016  . Metatarsal fracture 12/22/2015  . Essential hypertension 12/22/2015  . Night sweats 01/21/2015  . GERD (gastroesophageal reflux disease) 01/21/2015  . Gastritis 12/19/2014  . Hyperbilirubinemia 12/19/2014   . Elevated bilirubin   . Nausea & vomiting 12/14/2014  . Severe tetrahydrocannabinol (THC) dependence (HCC) 12/14/2014  . Alcohol abuse 12/14/2014  . Cyclic vomiting syndrome 12/06/2014  . 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 Gastroparesis 05/11/2012  . Abdominal pain 05/06/2012  . Polysubstance abuse (HCC) 05/06/2012  . CHOLECYSTECTOMY, HX OF 11/25/2009    Past Surgical History:  Procedure Laterality Date  . CHOLECYSTECTOMY  11/2009        Home Medications    Prior to Admission medications   Medication Sig Start Date End Date Taking? Authorizing Provider  acetaminophen (TYLENOL) 500 MG tablet Take 500 mg by mouth daily as needed for mild pain.   Yes [provider]  Capsaicin-Menthol-Methyl Sal (CAPSAICIN-METHYL SAL-MENTHOL) 0.025-1-12 % CREA Apply 1 inch topically 2 (two) times daily. 11/04/17  Yes Dayton ScrapeMurray, Alyssa B, PA-C  dicyclomine (BENTYL) 20 MG tablet Take 1 tablet (20 mg total) by mouth 2 (two) times daily. 11/04/17  Yes [place] ScrapeMurray, Alyssa B, PA-C  promethazine (PHENERGAN) 25 MG suppository Place 1 suppository (25 mg total) rectally every 8 (eight) hours as needed for nausea or vomiting. 11/04/17  Yes [place] ScrapeMurray, Alyssa B, PA-C  lidocaine (XYLOCAINE) 2 % solution Use as directed 15 mLs every 3 (three) hours as needed in the mouth or throat for mouth pain. Patient not taking: Reported on 11/08/2017 07/01/17   McDonald, Pedro EarlsMia A, PA-C  lisinopril (PRINIVIL,ZESTRIL) 5 MG tablet Take 1 tablet (5 mg total) by mouth daily. Patient not taking: Reported  on 11/08/2017 08/10/16   Hoy Register, MD  metoCLOPramide (REGLAN) 10 MG tablet Take 1 tablet (10 mg total) by mouth every 6 (six) hours. Patient not taking: Reported on 11/08/2017 05/08/16   Donnetta Hutching, MD  omeprazole (PRILOSEC) 20 MG capsule Take 1 capsule (20 mg total) by mouth daily. Patient not taking: Reported on 11/08/2017 08/10/16   Hoy Register, MD  ondansetron (ZOFRAN) 8 MG tablet  Take 1 tablet (8 mg total) by mouth every 8 (eight) hours as needed for nausea or vomiting. Patient not taking: Reported on 11/08/2017 08/10/16   Hoy Register, MD  pantoprazole (PROTONIX) 20 MG tablet Take 1 tablet (20 mg total) by mouth daily. Patient not taking: Reported on 11/08/2017 02/04/17   Muthersbaugh, Dahlia Client, PA-C  penicillin v potassium (VEETID) 500 MG tablet Take 1 tablet (500 mg total) by mouth 3 (three) times daily. Patient not taking: Reported on 11/08/2017 08/08/17   Muthersbaugh, Dahlia Client, PA-C  potassium chloride SA (K-DUR,KLOR-CON) 20 MEQ tablet Take 1 tablet (20 mEq total) by mouth daily for 3 doses. Patient not taking: Reported on 11/08/2017 11/04/17 11/07/17  Elisha Ponder, PA-C    Family History Family History  Problem Relation Age of Onset  . Other Father   . Diabetes Father   . Hypertension Father     Social History Social History   Tobacco Use  . Smoking status: Current Some Day Smoker    Years: 8.00    Types: Cigars    Last attempt to quit: 12/15/2014    Years since quitting: 2.9  . Smokeless tobacco: Never Used  Substance Use Topics  . Alcohol use: No    Alcohol/week: 0.0 oz  . Drug use: Yes    Types: Marijuana    Comment: Last used: 2 days ago     Allergies   Patient has no known allergies.   Review of Systems Review of Systems  Constitutional: Negative for appetite change, chills and fever.  HENT: Negative for ear pain, rhinorrhea, sneezing and sore throat.   Eyes: Negative for photophobia and visual disturbance.  Respiratory: Negative for cough, chest tightness, shortness of breath and wheezing.   Cardiovascular: Negative for chest pain and palpitations.  Gastrointestinal: Positive for abdominal pain, nausea and vomiting. Negative for blood in stool, constipation and diarrhea.  Genitourinary: Negative for dysuria, hematuria and urgency.  Musculoskeletal: Negative for myalgias.  Skin: Negative for rash.  Neurological: Negative for  dizziness, weakness and light-headedness.     Physical Exam Updated Vital Signs BP (!) 135/58 (BP Location: Right Arm)   Pulse 70   Temp 97.9 F (36.6 C) (Oral)   Resp 18   SpO2 100%   Physical Exam  Constitutional: He appears well-developed and well-nourished. No distress.  HENT:  Head: Normocephalic and atraumatic.  Nose: Nose normal.  Eyes: Conjunctivae and EOM are normal. Left eye exhibits no discharge. No scleral icterus.  Neck: Normal range of motion. Neck supple.  Cardiovascular: Normal rate, regular rhythm, normal heart sounds and intact distal pulses. Exam reveals no gallop and no friction rub.  No murmur heard. Pulmonary/Chest: Effort normal and breath sounds normal. No respiratory distress.  Abdominal: Soft. Bowel sounds are normal. He exhibits no distension. There is generalized tenderness. There is no guarding.  Musculoskeletal: Normal range of motion. He exhibits no edema.  Neurological: He is alert. He exhibits normal muscle tone. Coordination normal.  Skin: Skin is warm and Scott. No rash noted.  Psychiatric: He has a normal mood and affect.  Nursing  note and vitals reviewed.    ED Treatments / Results  Labs (all labs ordered are listed, but only abnormal results are displayed) Labs Reviewed  COMPREHENSIVE METABOLIC PANEL - Abnormal; Notable for the following components:      Result Value   Chloride 100 (*)    Glucose, Bld 140 (*)    Total Bilirubin 1.7 (*)    All other components within normal limits  CBC - Abnormal; Notable for the following components:   WBC 11.4 (*)    All other components within normal limits  URINALYSIS, ROUTINE W REFLEX MICROSCOPIC - Abnormal; Notable for the following components:   Color, Urine STRAW (*)    Specific Gravity, Urine 1.004 (*)    All other components within normal limits  LIPASE, BLOOD    EKG None  Radiology No results found.  Procedures Procedures (including critical care time)  Medications Ordered in  ED Medications  ondansetron (ZOFRAN-ODT) disintegrating tablet 4 mg (4 mg Oral Given 11/08/17 1341)  sodium chloride 0.9 % bolus 1,000 mL (0 mLs Intravenous Stopped 11/08/17 1832)  haloperidol lactate (HALDOL) injection 2 mg (2 mg Intravenous Given 11/08/17 1725)  capsaicin (ZOSTRIX) 0.025 % cream ( Topical Given 11/08/17 1727)     Initial Impression / Assessment and Plan / ED Course  I have reviewed the triage vital signs and the nursing notes.  Pertinent labs & imaging results that were available during my care of the patient were reviewed by me and considered in my medical decision making (see chart for details).     Patient, with a past medical history of polysubstance abuse, H. pylori infection, chronic marijuana use, cyclical vomiting syndrome, presents to ED for evaluation of 2-day history of cramping generalized, lower abdominal pain and several episodes of NBNB emesis.  He was seen and evaluated in the ED 4 days ago for similar symptoms but was unable to fill the prescriptions given to him for what appears to be his cyclical vomiting syndrome.  CT and ultrasound done at that time was unremarkable.  On physical exam he does have generalized tenderness to palpation.  He endorses continue daily marijuana use.  Lab work including CBC, CMP, urinalysis, lipase unremarkable.  Patient reports significant improvement in his symptoms with Haldol and capsaicin cream provided.  He was also given Zofran on arrival to the room.  He was able to tolerate p.o. intake without difficulty after the medications were given.  Low suspicion for appendicitis, diverticulitis, and other infectious cause of his symptoms based on his improved leukocytosis and negative workup 4 days ago.  Patient states that he will get the prescriptions filled that were given to him at his previous visit.  Suspect that his symptoms are due to his cannabinoid hyperemesis syndrome.  Patient appears stable for discharge at this time.  Strict  return precautions given.  Portions of this note were generated with Scientist, clinical (histocompatibility and immunogenetics). Dictation errors may occur despite best attempts at proofreading.   Final Clinical Impressions(s) / ED Diagnoses   Final diagnoses:  Cannabinoid hyperemesis syndrome [facility])    ED Discharge Orders    None       Dietrich Pates, PA-C 11/08/17 1935    Terrilee Files, MD 11/09/17 1215

## 2017-11-13 IMAGING — CT CT ABD-PELV W/ CM
2 of 4 series · 16 of 46 positions shown, 18 images · IV contrast (ISOVUE)
Comparison: CT 12/09/2015

CLINICAL DATA: Pt states that he was prescribed reglan for
gastroparesis and was told not to smoke marijuana, which he has done
twice since and has had adverse reactions. Pt has N/V and feels
generally fatigued. Alert to voice. Oriented. WBC=19.1*^100mL
GRRA00-L33 IOPAMIDOL (GRRA00-L33) INJECTION 61%

EXAM:
CT ABDOMEN AND PELVIS WITH CONTRAST
TECHNIQUE: Multidetector CT imaging of the abdomen and pelvis was performed
using the standard protocol following bolus administration of
intravenous contrast.
CONTRAST:  100mL GRRA00-L33 IOPAMIDOL (GRRA00-L33) INJECTION 61%

[Series 2: abd/pel with · axial · 0.70mm/px · z∈[-530,-124]mm · 13 of 91 slices shown, 15 images]
[im 5/91  soft-tissue]
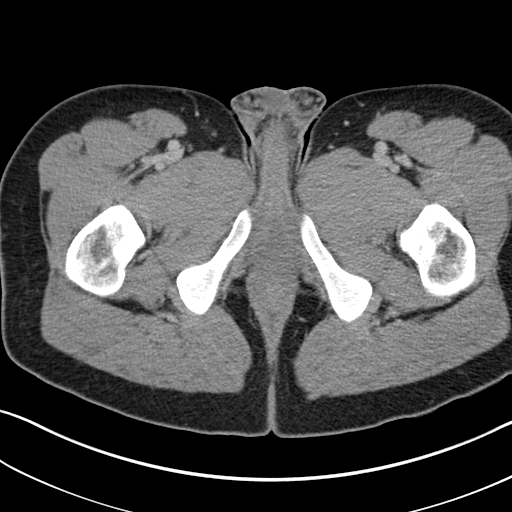
[im 5/91  bone]
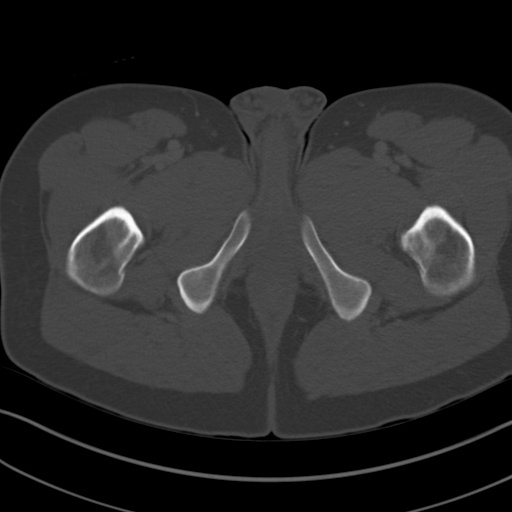
[im 13/91  soft-tissue]
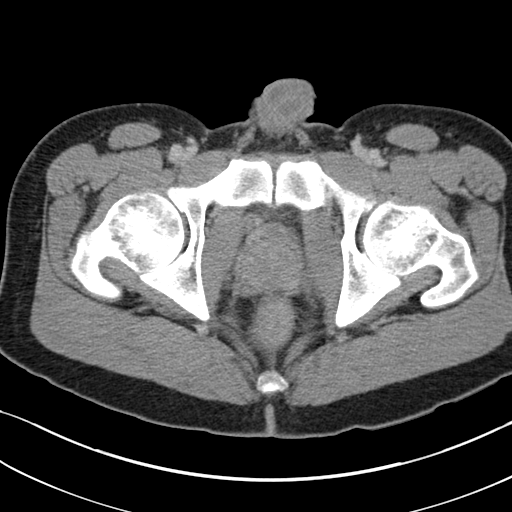
[im 18/91  soft-tissue]
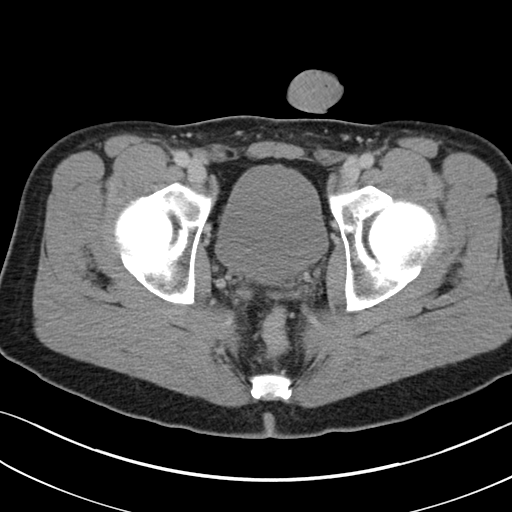
[im 26/91  soft-tissue]
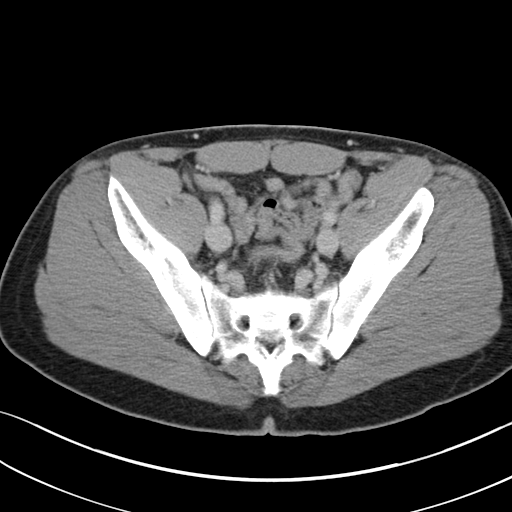
[im 31/91  soft-tissue]
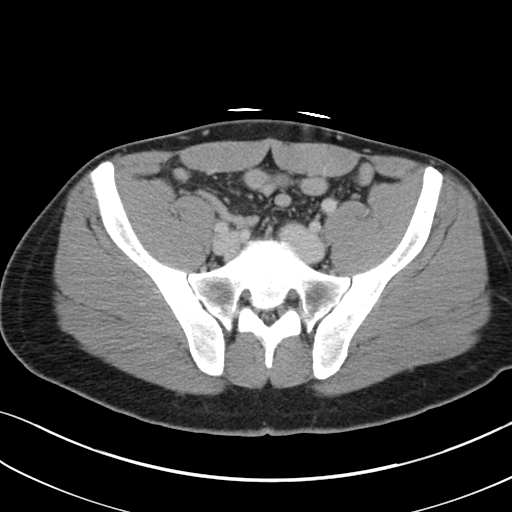
[im 39/91  soft-tissue]
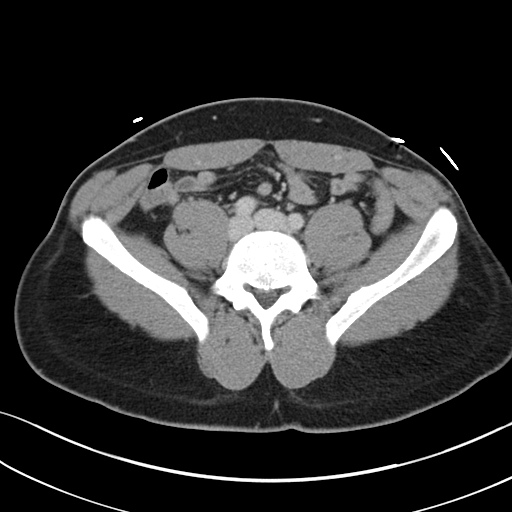
[im 48/91  soft-tissue]
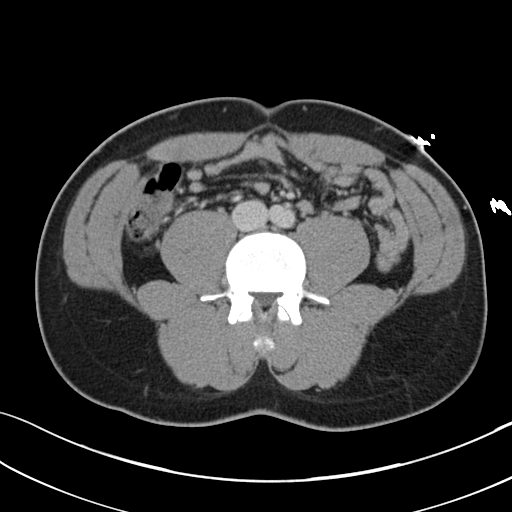
[im 52/91  soft-tissue]
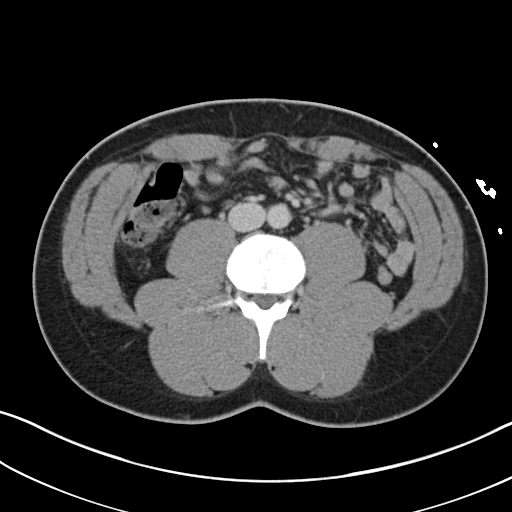
[im 61/91  soft-tissue]
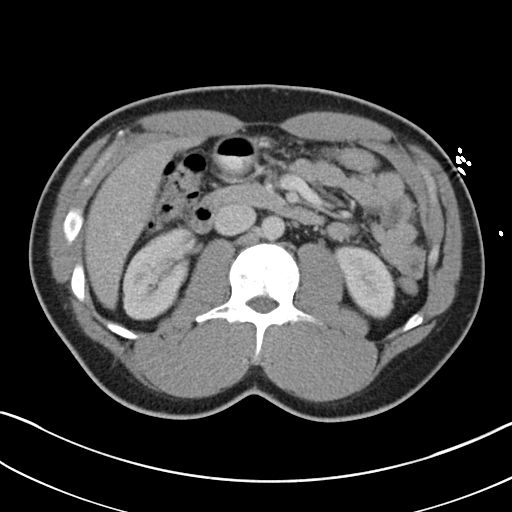
[im 61/91  bone]
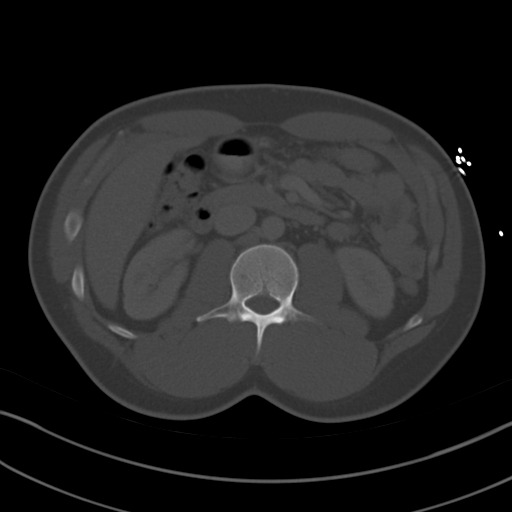
[im 65/91  soft-tissue]
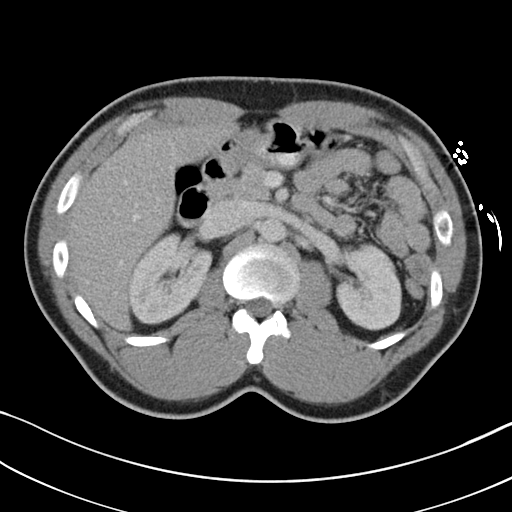
[im 73/91  soft-tissue]
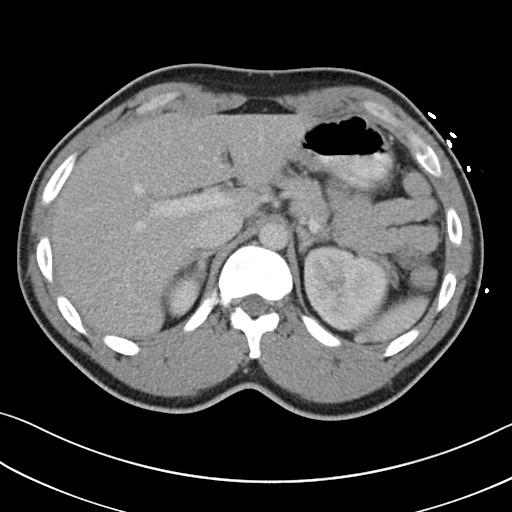
[im 78/91  soft-tissue]
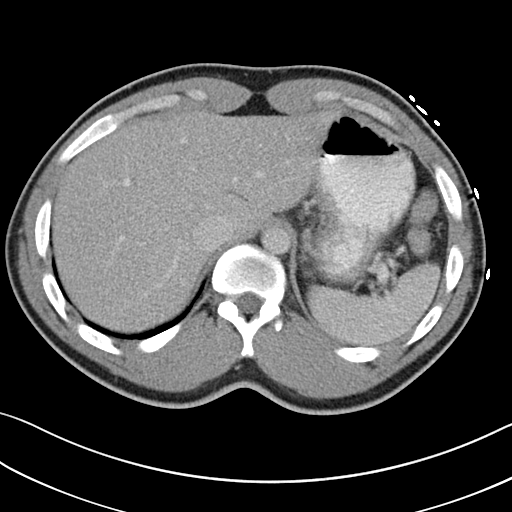
[im 86/91  soft-tissue]
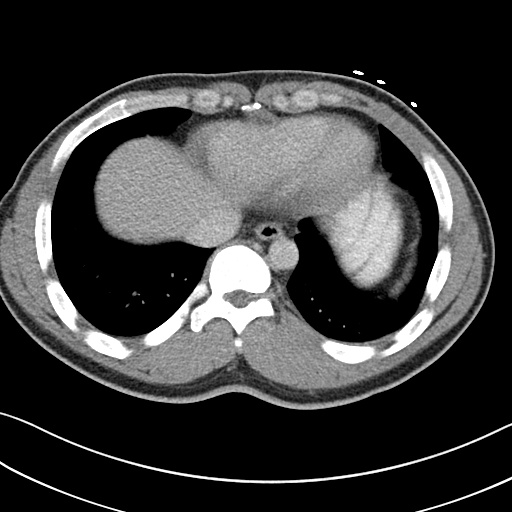

[Series 5: coronal a/|p · coronal · 0.68mm/px · 3 of 128 slices shown]
[im 43/128  soft-tissue]
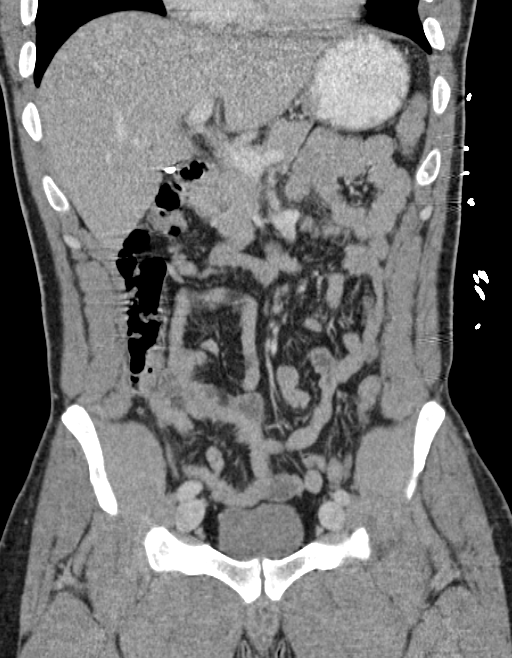
[im 57/128  soft-tissue]
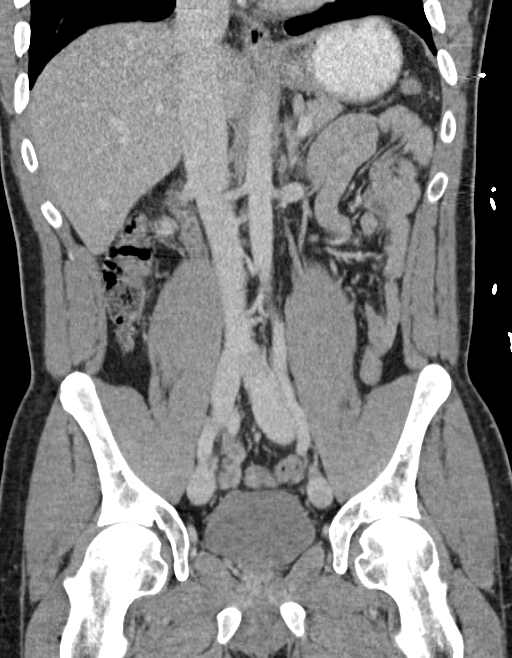
[im 71/128  soft-tissue]
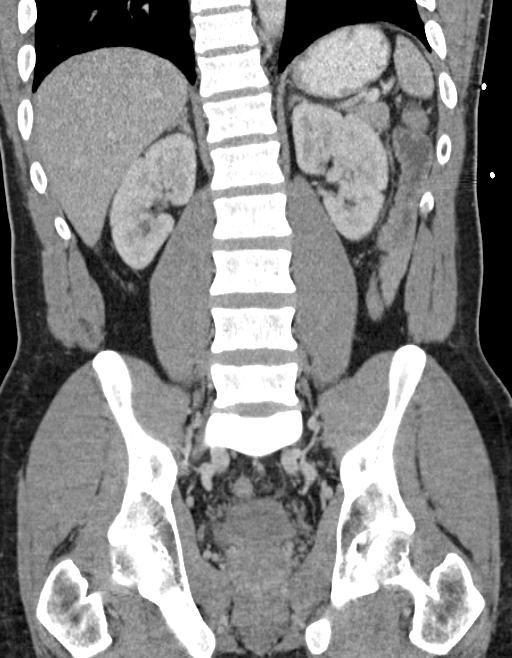

[16 of 46 positions shown; findings below may reference images not displayed]

FINDINGS: Lower chest: Lung bases are clear.

Hepatobiliary: No focal hepatic lesion. Postcholecystectomy. No
biliary dilatation.

Pancreas: Pancreas is normal. No ductal dilatation. No pancreatic
inflammation.

Spleen: Normal spleen

Adrenals/urinary tract: Adrenal glands are normal. Small
nonobstructing calculus upper pole of the RIGHT kidney. No
ureterolithiasis or obstructive uropathy. Bladder normal.

Stomach/Bowel: Stomach, small bowel, appendix, and cecum are normal.
The colon and rectosigmoid colon are normal.

Vascular/Lymphatic: Abdominal aorta is normal caliber. There is no
retroperitoneal or periportal lymphadenopathy. No pelvic
lymphadenopathy.

Reproductive: Prostate normal.

Other: No free fluid.

Musculoskeletal: No aggressive osseous lesion.
IMPRESSION: 1. No acute findings in the abdomen pelvis.
2. Normal appendix.
3. Postcholecystectomy.
4. Small nonobstructing RIGHT renal calculus.

## 2018-01-09 ENCOUNTER — Emergency Department (HOSPITAL_COMMUNITY): Admission: EM | Admit: 2018-01-09 | Discharge: 2018-01-09 | Discharge status: Against Medical Advice | Payer: Self-pay

## 2018-01-09 NOTE — ED Notes (Signed)
Pt dd not answer for first call, then appeared in triage stating that his name was Matthew Scott.  Pt. Was instructed to wait again to be called.  PT called 2nd time with no answer

## 2018-02-19 ENCOUNTER — Other Ambulatory Visit: Payer: Self-pay

## 2018-02-19 ENCOUNTER — Encounter (HOSPITAL_COMMUNITY): Payer: Self-pay | Admitting: Pharmacy Technician

## 2018-02-19 ENCOUNTER — Emergency Department (HOSPITAL_COMMUNITY)
Admission: EM | Admit: 2018-02-19 | Discharge: 2018-02-19 | Disposition: A | Discharge status: Home / Self Care | Payer: Self-pay | Attending: Emergency Medicine | Admitting: Emergency Medicine

## 2018-02-19 DIAGNOSIS — F1290 Cannabis use, unspecified, uncomplicated: Secondary | ICD-10-CM

## 2018-02-19 DIAGNOSIS — I10 Essential (primary) hypertension: Secondary | ICD-10-CM | POA: Insufficient documentation

## 2018-02-19 DIAGNOSIS — Z79899 Other long term (current) drug therapy: Secondary | ICD-10-CM | POA: Insufficient documentation

## 2018-02-19 DIAGNOSIS — F1210 Cannabis abuse, uncomplicated: Secondary | ICD-10-CM | POA: Insufficient documentation

## 2018-02-19 DIAGNOSIS — G43A0 Cyclical vomiting, not intractable: Secondary | ICD-10-CM | POA: Insufficient documentation

## 2018-02-19 DIAGNOSIS — R109 Unspecified abdominal pain: Secondary | ICD-10-CM | POA: Insufficient documentation

## 2018-02-19 DIAGNOSIS — R1115 Cyclical vomiting syndrome unrelated to migraine: Secondary | ICD-10-CM

## 2018-02-19 DIAGNOSIS — F17210 Nicotine dependence, cigarettes, uncomplicated: Secondary | ICD-10-CM | POA: Insufficient documentation

## 2018-02-19 DIAGNOSIS — G8929 Other chronic pain: Secondary | ICD-10-CM

## 2018-02-19 LAB — COMPREHENSIVE METABOLIC PANEL
ALT: 18 U/L (ref 0–44)
AST: 33 U/L (ref 15–41)
Albumin: 4.4 g/dL (ref 3.5–5.0)
Alkaline Phosphatase: 68 U/L (ref 38–126)
Anion gap: 6 (ref 5–15)
BUN: 12 mg/dL (ref 6–20)
CO2: 28 mmol/L (ref 22–32)
Calcium: 10.2 mg/dL (ref 8.9–10.3)
Chloride: 111 mmol/L (ref 98–111)
Creatinine, Ser: 1.16 mg/dL (ref 0.61–1.24)
GFR calc Af Amer: >60 mL/min (ref >60)
GFR calc non Af Amer: >60 mL/min (ref >60)
Glucose, Bld: 158 mg/dL — ABNORMAL HIGH (ref 70–99)
Potassium: 3.9 mmol/L (ref 3.5–5.1)
Sodium: 145 mmol/L (ref 135–145)
Total Bilirubin: 1.9 mg/dL — ABNORMAL HIGH (ref 0.3–1.2)
Total Protein: 7.7 g/dL (ref 6.5–8.1)

## 2018-02-19 LAB — URINALYSIS, ROUTINE W REFLEX MICROSCOPIC
Bacteria, UA: NONE SEEN
Bilirubin Urine: NEGATIVE
Glucose, UA: NEGATIVE mg/dL
Ketones, ur: 20 mg/dL — AB
Leukocytes, UA: NEGATIVE
Nitrite: NEGATIVE
Protein, ur: NEGATIVE mg/dL
Specific Gravity, Urine: 1.014 (ref 1.005–1.030)
pH: 6 (ref 5.0–8.0)

## 2018-02-19 LAB — CBC
HCT: 41.2 % (ref 39.0–52.0)
Hemoglobin: 13.1 g/dL (ref 13.0–17.0)
MCH: 31.6 pg (ref 26.0–34.0)
MCHC: 31.8 g/dL (ref 30.0–36.0)
MCV: 99.5 fL (ref 78.0–100.0)
Platelets: 253 10*3/uL (ref 150–400)
RBC: 4.14 MIL/uL — ABNORMAL LOW (ref 4.22–5.81)
RDW: 11.8 % (ref 11.5–15.5)
WBC: 17.8 10*3/uL — ABNORMAL HIGH (ref 4.0–10.5)

## 2018-02-19 LAB — LIPASE, BLOOD: Lipase: 26 U/L (ref 11–51)

## 2018-02-19 MED ORDER — ONDANSETRON 4 MG PO TBDP: 4.0000 mg | ORAL_TABLET | Freq: Three times a day (TID) | ORAL | 0 refills | Status: DC | PRN

## 2018-02-19 MED ORDER — ONDANSETRON HCL 4 MG/2ML IJ SOLN
4.0000 mg | Freq: Once | INTRAMUSCULAR | Status: DC
  Filled 2018-02-19: qty 2

## 2018-02-19 MED ORDER — PROMETHAZINE HCL 25 MG/ML IJ SOLN
25.0000 mg | Freq: Once | INTRAMUSCULAR | Status: AC
  Administered 2018-02-19: 25 mg via INTRAVENOUS
  Filled 2018-02-19: qty 1

## 2018-02-19 MED ORDER — HALOPERIDOL LACTATE 5 MG/ML IJ SOLN
5.0000 mg | Freq: Once | INTRAMUSCULAR | Status: AC
  Administered 2018-02-19: 5 mg via INTRAVENOUS
  Filled 2018-02-19: qty 1

## 2018-02-19 MED ORDER — METOCLOPRAMIDE HCL 5 MG/ML IJ SOLN
10.0000 mg | Freq: Once | INTRAMUSCULAR | Status: AC
  Administered 2018-02-19: 10 mg via INTRAVENOUS
  Filled 2018-02-19: qty 2

## 2018-02-19 MED ORDER — SODIUM CHLORIDE 0.9 % IV BOLUS
1000.0000 mL | Freq: Once | INTRAVENOUS | Status: AC
  Administered 2018-02-19: 1000 mL via INTRAVENOUS

## 2018-02-19 MED ORDER — CAPSAICIN-MENTHOL-METHYL SAL 0.025-1-12 % EX CREA: 1.0000 [in_us] | TOPICAL_CREAM | Freq: Two times a day (BID) | CUTANEOUS | 0 refills | Status: DC

## 2018-02-19 MED ORDER — CAPSAICIN 0.025 % EX CREA
TOPICAL_CREAM | Freq: Once | CUTANEOUS | Status: AC
  Administered 2018-02-19: 20:00:00 via TOPICAL
  Filled 2018-02-19: qty 60

## 2018-02-19 NOTE — ED Notes (Signed)
Pt dry heaving at present.  

## 2018-02-19 NOTE — ED Notes (Signed)
Pt diaphoretic, family at bedside.

## 2018-02-19 NOTE — ED Provider Notes (Signed)
[facility]   CSN: [id number] Arrival date & time: 02/19/18  1635     History   Chief Complaint No chief complaint on file.   HPI Pauline AusWillie E Rahimi is a 37 y.o. male.  HPI   Ned ClinesWillie Watlington is a 37yo male with a history of chronic abdominal pain, cyclical vomiting syndrome, daily marijuana use, elevated bilirubin, gastroparesis, hypertension who presents to the emergency department via EMS for evaluation of right-sided abdominal pain.  Patient reports that his pain began 2 days ago, but significantly worsened today.  He reports his pain is severe, intermittent and "feels like someone is punching and stabbing me."  No apparent trigger to pain.  He tried taking some Tylenol, but immediately had vomiting.  States that he has vomited about 4 times today, nonbloody, no coffee-ground emesis.  States that he used marijuana several times yesterday.  Had a bowel movement today which was normal for him.  He has had a cholecystectomy in the past.  He denies fevers, chills, melena, hematochezia, diarrhea, dysuria, urinary frequency, hematuria, obstipation, chest pain, sob, lightheadedness, syncope.   Past Medical History:  Diagnosis Date  . 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 Chronic abdominal pain   . Cyclical vomiting syndrome   . GASTRITIS 12/09/2009   Qualifier: Diagnosis of  By: Daphine DeutscherMartin FNP, Zena AmosNykedtra    . Gastroparesis   . HELICOBACTER PYLORI INFECTION 12/14/2009   Qualifier: Diagnosis of  By: Levon Hedgerraddock, Brenda    . Nausea and vomiting    chronic, recurrent  . Polysubstance abuse [facility](HCC)     Patient Active Problem List   Diagnosis Date Noted  . Uncontrollable vomiting 03/10/2016  . Anemia 03/10/2016  . Metatarsal fracture 12/22/2015  . Essential hypertension 12/22/2015  . Night sweats 01/21/2015  . GERD (gastroesophageal reflux disease) 01/21/2015  . Gastritis 12/19/2014  .  Hyperbilirubinemia 12/19/2014  . Elevated bilirubin   . Nausea & vomiting 12/14/2014  . Severe tetrahydrocannabinol (THC) dependence (HCC) 12/14/2014  . Alcohol abuse 12/14/2014  . Cyclic vomiting syndrome 12/06/2014  . 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 Gastroparesis 05/11/2012  . Abdominal pain 05/06/2012  . Polysubstance abuse (HCC) 05/06/2012  . CHOLECYSTECTOMY, HX OF 11/25/2009    Past Surgical History:  Procedure Laterality Date  . CHOLECYSTECTOMY  11/2009        Home Medications    Prior to Admission medications   Medication Sig Start Date End Date Taking? Authorizing Provider  acetaminophen (TYLENOL) 500 MG tablet Take 500 mg by mouth daily as needed for mild pain.   Yes [provider]  Capsaicin-Menthol-Methyl Sal (CAPSAICIN-METHYL SAL-MENTHOL) 0.025-1-12 % CREA Apply 1 inch topically 2 (two) times daily. Patient not taking: Reported on 02/19/2018 11/04/17   Aviva KluverMurray, Alyssa B, PA-C  dicyclomine (BENTYL) 20 MG tablet Take 1 tablet (20 mg total) by mouth 2 (two) times daily. Patient not taking: Reported on 02/19/2018 11/04/17   Aviva KluverMurray, Alyssa B, PA-C  lidocaine (XYLOCAINE) 2 % solution Use as directed 15 mLs every 3 (three) hours as needed in the mouth or throat for mouth pain. Patient not taking: Reported on 11/08/2017 07/01/17   McDonald, Pedro EarlsMia A, PA-C  lisinopril (PRINIVIL,ZESTRIL) 5 MG tablet Take 1 tablet (5 mg total) by mouth daily. Patient not taking: Reported on 11/08/2017 08/10/16   Hoy RegisterNewlin, Enobong, MD  metoCLOPramide (REGLAN) 10 MG tablet Take 1 tablet (10 mg total) by mouth  every 6 (six) hours. Patient not taking: Reported on 11/08/2017 05/08/16   Donnetta Hutching, MD  omeprazole (PRILOSEC) 20 MG capsule Take 1 capsule (20 mg total) by mouth daily. Patient not taking: Reported on 11/08/2017 08/10/16   Hoy Register, MD  ondansetron (ZOFRAN) 8 MG tablet Take 1 tablet (8 mg total) by mouth every 8 (eight) hours as needed for  nausea or vomiting. Patient not taking: Reported on 11/08/2017 08/10/16   Hoy Register, MD  pantoprazole (PROTONIX) 20 MG tablet Take 1 tablet (20 mg total) by mouth daily. Patient not taking: Reported on 11/08/2017 02/04/17   Muthersbaugh, Dahlia Client, PA-C  penicillin v potassium (VEETID) 500 MG tablet Take 1 tablet (500 mg total) by mouth 3 (three) times daily. Patient not taking: Reported on 11/08/2017 08/08/17   Muthersbaugh, Dahlia Client, PA-C  potassium chloride SA (K-DUR,KLOR-CON) 20 MEQ tablet Take 1 tablet (20 mEq total) by mouth daily for 3 doses. Patient not taking: Reported on 11/08/2017 11/04/17 11/07/17  Elisha Ponder, PA-C  promethazine (PHENERGAN) 25 MG suppository Place 1 suppository (25 mg total) rectally every 8 (eight) hours as needed for nausea or vomiting. Patient not taking: Reported on 02/19/2018 11/04/17   Elisha Ponder, PA-C    Family History Family History  Problem Relation Age of Onset  . Other Father   . Diabetes Father   . Hypertension Father     Social History Social History   Tobacco Use  . Smoking status: Current Some Day Smoker    Years: 8.00    Types: Cigars    Last attempt to quit: 12/15/2014    Years since quitting: 3.1  . Smokeless tobacco: Never Used  Substance Use Topics  . Alcohol use: No    Alcohol/week: 0.0 oz  . Drug use: Yes    Types: Marijuana    Comment: Last used: 2 days ago     Allergies   Patient has no known allergies.   Review of Systems Review of Systems  Constitutional: Negative for chills and fever.  Eyes: Negative for visual disturbance.  Respiratory: Negative for cough and shortness of breath.   Cardiovascular: Negative for chest pain.  Gastrointestinal: Positive for abdominal pain (right sided), nausea and vomiting. Negative for blood in stool, constipation and diarrhea.  Genitourinary: Negative for difficulty urinating, dysuria and frequency.  Musculoskeletal: Negative for back pain and gait problem.  Skin: Negative for  rash.  Neurological: Negative for syncope and light-headedness.  Psychiatric/Behavioral: Negative for agitation.     Physical Exam Updated Vital Signs BP (!) 125/56   Pulse 62   Temp 97.7 F (36.5 C) (Oral)   Resp 18   SpO2 100%   Physical Exam  Constitutional: He is oriented to person, place, and time. He appears well-developed and well-nourished. No distress.  Appears painful. Diaphoretic.   HENT:  Head: Normocephalic and atraumatic.  Mouth/Throat: Oropharynx is clear and moist. No oropharyngeal exudate.  Mucous membranes moist.   Eyes: Pupils are equal, round, and reactive to light. Conjunctivae are normal. Right eye exhibits no discharge. Left eye exhibits no discharge.  Neck: Normal range of motion. Neck supple.  Cardiovascular: Normal rate and regular rhythm.  Pulmonary/Chest: Effort normal and breath sounds normal. No stridor. No respiratory distress. He has no wheezes. He has no rales.  Abdominal:  Abdomen soft. Acutely tender to palpation over right upper quadrant, right lower quadrant and epigastrium. No guarding or rigidity. No rebound tenderness. Negative McBurney's point.   Musculoskeletal: Normal range of motion.  Neurological:  He is alert and oriented to person, place, and time. Coordination normal.  Skin: Skin is warm.  Psychiatric: He has a normal mood and affect. His behavior is normal.  Nursing note and vitals reviewed.   ED Treatments / Results  Labs (all labs ordered are listed, but only abnormal results are displayed) Labs Reviewed  COMPREHENSIVE METABOLIC PANEL - Abnormal; Notable for the following components:      Result Value   Glucose, Bld 158 (*)    Total Bilirubin 1.9 (*)    All other components within normal limits  CBC - Abnormal; Notable for the following components:   WBC 17.8 (*)    RBC 4.14 (*)    All other components within normal limits  URINALYSIS, ROUTINE W REFLEX MICROSCOPIC - Abnormal; Notable for the following components:    Color, Urine STRAW (*)    Hgb urine dipstick SMALL (*)    Ketones, ur 20 (*)    All other components within normal limits  LIPASE, BLOOD    EKG None  Radiology No results found.  Procedures Procedures (including critical care time)  Medications Ordered in ED Medications  ondansetron (ZOFRAN) injection 4 mg (has no administration in time range)  haloperidol lactate (HALDOL) injection 5 mg (5 mg Intravenous Given 02/19/18 1801)  capsaicin (ZOSTRIX) 0.025 % cream ( Topical Given 02/19/18 2009)  promethazine (PHENERGAN) injection 25 mg (25 mg Intravenous Given by Other 02/19/18 1801)  sodium chloride 0.9 % bolus 1,000 mL (0 mLs Intravenous Stopped 02/19/18 1831)  metoCLOPramide (REGLAN) injection 10 mg (10 mg Intravenous Given 02/19/18 2018)     Initial Impression / Assessment and Plan / ED Course  I have reviewed the triage vital signs and the nursing notes.  Pertinent labs & imaging results that were available during my care of the patient were reviewed by me and considered in my medical decision making (see chart for details).     Patient with a history of chronic abdominal pain, cyclical vomiting syndrome, marijuana use presents with worsening abdominal pain and vomiting for the past 2 days now.  On exam he is afebrile.  Has right sided abdominal pain.  No guarding or rigidity.  No rebound tenderness. McBurney's point negative. No concern for acute surgical abdomen.  Per chart review, patient has had many CT abdomen/pelvic scans.  01/2017, patient had similar symptoms with WBC count 21.3 and negative CT abdomen/pelvis.  Labs reviewed today.  He does have a leukocytosis with WBC count 17.8.  Otherwise CBC unremarkable.  Lipase negative.  UA without evidence of infection.  CMP with normal liver enzymes, normal kidney function.  Total bilirubin elevated, although this is chronic as well.   Patient treated with capsaicin, phenergan, reglan, fluids. On recheck he is tolerating fluids. Repeat  abdominal pain soft with pain much improved. Feel that his pain is likely related to chronic pain and hyperemesis cannabis. Plan to discharge home with capsaicin and zofran as needed. Counseled him on return precautions and he agrees. Discussed this patient with Dr. Deretha Emory who agrees with plan to discharge home.   Final Clinical Impressions(s) / ED Diagnoses   Final diagnoses:  Non-intractable cyclical vomiting with nausea  Marijuana use  Chronic abdominal pain    ED Discharge Orders        Ordered    Capsaicin-Menthol-Methyl Sal (CAPSAICIN-METHYL SAL-MENTHOL) 0.025-1-12 % CREA  2 times daily     02/19/18 2202    ondansetron (ZOFRAN ODT) 4 MG disintegrating tablet  Every 8 hours PRN  02/19/18 2202       Kellie Shropshire, PA-C 02/19/18 2313    Vanetta Mulders, MD 02/21/18 [phone]

## 2018-02-19 NOTE — ED Notes (Signed)
Matthew Scott, [phone]

## 2018-02-19 NOTE — ED Notes (Signed)
Pt given gingerale and crackers for po challenge. Pt tolerating well. Will continue to monitor.

## 2018-02-19 NOTE — Discharge Instructions (Signed)
Please take zofran at home as needed for nausea and vomiting.   I have also written you a prescription for cream you can place on your abdomen.   As we talked about marijuana can make these symptoms worse and I recommend you do not use this.   Return to the ER if you have any new or concerning symptoms like vomiting that does not improve with medication, fever, worsening abdominal pain.

## 2018-02-19 NOTE — ED Notes (Signed)
Pt ambulatory to the restroom.  

## 2018-02-19 NOTE — ED Notes (Signed)
ED Provider at bedside. 

## 2018-02-19 NOTE — ED Notes (Addendum)
Pt verbalizes understanding of d/c instructions. Pt received prescriptions. Pt taken to lobby in wheelchair at d/c with all belongings and with family.   

## 2018-02-19 NOTE — ED Triage Notes (Signed)
Pt arrives via EMS with reports of abd pain X2 days. Endorses NV. Given 4mg  zofran and 100mcg Fentanyl en route. 12 Lead unremarkable. 146 CBG, 147/85 BP, 86 HR, 98% RA. Pt appears uncomfortable, fidgeting in the stretcher. EMS reports pt was diaphoretic on scene. RLQ pain, tender to palpation. Hx cholecystectomy.

## 2018-02-21 ENCOUNTER — Encounter (HOSPITAL_COMMUNITY): Payer: Self-pay

## 2018-02-21 ENCOUNTER — Emergency Department (HOSPITAL_COMMUNITY)
Admission: EM | Admit: 2018-02-21 | Discharge: 2018-02-21 | Disposition: A | Discharge status: Home / Self Care | Payer: Self-pay | Attending: Emergency Medicine | Admitting: Emergency Medicine

## 2018-02-21 ENCOUNTER — Other Ambulatory Visit: Payer: Self-pay

## 2018-02-21 DIAGNOSIS — F1290 Cannabis use, unspecified, uncomplicated: Secondary | ICD-10-CM

## 2018-02-21 DIAGNOSIS — R112 Nausea with vomiting, unspecified: Secondary | ICD-10-CM

## 2018-02-21 DIAGNOSIS — F17290 Nicotine dependence, other tobacco product, uncomplicated: Secondary | ICD-10-CM | POA: Insufficient documentation

## 2018-02-21 DIAGNOSIS — R1115 Cyclical vomiting syndrome unrelated to migraine: Secondary | ICD-10-CM

## 2018-02-21 DIAGNOSIS — F12988 Cannabis use, unspecified with other cannabis-induced disorder: Secondary | ICD-10-CM | POA: Insufficient documentation

## 2018-02-21 DIAGNOSIS — I10 Essential (primary) hypertension: Secondary | ICD-10-CM | POA: Insufficient documentation

## 2018-02-21 LAB — LIPASE, BLOOD: Lipase: 23 U/L (ref 11–51)

## 2018-02-21 LAB — COMPREHENSIVE METABOLIC PANEL
ALT: 28 U/L (ref 0–44)
AST: 50 U/L — ABNORMAL HIGH (ref 15–41)
Albumin: 4.9 g/dL (ref 3.5–5.0)
Alkaline Phosphatase: 68 U/L (ref 38–126)
Anion gap: 15 (ref 5–15)
BUN: 22 mg/dL — ABNORMAL HIGH (ref 6–20)
CO2: 27 mmol/L (ref 22–32)
Calcium: 10.4 mg/dL — ABNORMAL HIGH (ref 8.9–10.3)
Chloride: 94 mmol/L — ABNORMAL LOW (ref 98–111)
Creatinine, Ser: 1.03 mg/dL (ref 0.61–1.24)
GFR calc Af Amer: >60 mL/min (ref >60)
GFR calc non Af Amer: >60 mL/min (ref >60)
Glucose, Bld: 120 mg/dL — ABNORMAL HIGH (ref 70–99)
Potassium: 3.5 mmol/L (ref 3.5–5.1)
Sodium: 136 mmol/L (ref 135–145)
Total Bilirubin: 3.6 mg/dL — ABNORMAL HIGH (ref 0.3–1.2)
Total Protein: 8.8 g/dL — ABNORMAL HIGH (ref 6.5–8.1)

## 2018-02-21 LAB — URINALYSIS, ROUTINE W REFLEX MICROSCOPIC
Bilirubin Urine: NEGATIVE
Glucose, UA: NEGATIVE mg/dL
Ketones, ur: 20 mg/dL — AB
Leukocytes, UA: NEGATIVE
Nitrite: NEGATIVE
Protein, ur: 100 mg/dL — AB
Specific Gravity, Urine: 1.034 — ABNORMAL HIGH (ref 1.005–1.030)
pH: 5 (ref 5.0–8.0)

## 2018-02-21 LAB — CBC
HCT: 42 % (ref 39.0–52.0)
Hemoglobin: 14.8 g/dL (ref 13.0–17.0)
MCH: 32.9 pg (ref 26.0–34.0)
MCHC: 35.2 g/dL (ref 30.0–36.0)
MCV: 93.3 fL (ref 78.0–100.0)
Platelets: 277 10*3/uL (ref 150–400)
RBC: 4.5 MIL/uL (ref 4.22–5.81)
RDW: 12.1 % (ref 11.5–15.5)
WBC: 17.1 10*3/uL — ABNORMAL HIGH (ref 4.0–10.5)

## 2018-02-21 MED ORDER — SODIUM CHLORIDE 0.9 % IV SOLN: 1000.0000 mL | INTRAVENOUS | Status: DC

## 2018-02-21 MED ORDER — CAPSAICIN 0.025 % EX CREA
TOPICAL_CREAM | Freq: Once | CUTANEOUS | Status: DC
  Filled 2018-02-21: qty 60

## 2018-02-21 MED ORDER — SODIUM CHLORIDE 0.9 % IV BOLUS (SEPSIS)
1000.0000 mL | Freq: Once | INTRAVENOUS | Status: AC
  Administered 2018-02-21: 1000 mL via INTRAVENOUS

## 2018-02-21 MED ORDER — LORAZEPAM 2 MG/ML IJ SOLN
1.0000 mg | Freq: Once | INTRAMUSCULAR | Status: AC
  Administered 2018-02-21: 1 mg via INTRAVENOUS
  Filled 2018-02-21: qty 1

## 2018-02-21 MED ORDER — OMEPRAZOLE 20 MG PO CPDR: 20.0000 mg | DELAYED_RELEASE_CAPSULE | Freq: Every day | ORAL | 1 refills | Status: DC

## 2018-02-21 MED ORDER — ONDANSETRON 4 MG PO TBDP
4.0000 mg | ORAL_TABLET | Freq: Once | ORAL | Status: AC | PRN
Start: 2018-02-21 — End: 2018-02-21
  Administered 2018-02-21: 4 mg via ORAL
  Filled 2018-02-21: qty 1

## 2018-02-21 MED ORDER — HALOPERIDOL LACTATE 5 MG/ML IJ SOLN
5.0000 mg | Freq: Once | INTRAMUSCULAR | Status: AC
  Administered 2018-02-21: 5 mg via INTRAVENOUS
  Filled 2018-02-21: qty 1

## 2018-02-21 MED ORDER — CAPSAICIN 0.025 % EX CREA: TOPICAL_CREAM | Freq: Two times a day (BID) | CUTANEOUS | 0 refills | Status: DC

## 2018-02-21 MED ORDER — DIPHENHYDRAMINE HCL 25 MG PO TABS: 25.0000 mg | ORAL_TABLET | Freq: Four times a day (QID) | ORAL | 0 refills | Status: DC | PRN

## 2018-02-21 MED ORDER — METOCLOPRAMIDE HCL 10 MG PO TABS: 10.0000 mg | ORAL_TABLET | Freq: Four times a day (QID) | ORAL | 0 refills | Status: DC | PRN

## 2018-02-21 NOTE — ED Provider Notes (Signed)
[facility]-EMERGENCY DEPT Provider Note   CSN: [id number] Arrival date & time: 02/21/18  1018     History   Chief Complaint Chief Complaint  Patient presents with  . Emesis  . Chills    [name] is a 37 y.o. male.  HPI Patient reports he has been having 2 to 3 days of abdominal pain with dry heaves.  Pain is central and sharp and burning quality in his central abdomen and epigastrium slightly up into the chest.  He reports it burns up into the back of his throat any dry heaves.  He is not eating much so he is not vomiting up large amounts of contents.  He reports he does eat something he will vomit.  He reports he sweats profusely.  No diarrhea.  No fever.  No respiratory symptoms.  Reports none of the antiemetics that have been prescribed to him have been helpful.  He always just vomits them back up.  He was seen 2 days ago at [facility] and treated.  He reports symptoms just came back again.  Patient has been a longtime and heavy smoker of marijuana. Past Medical History:  Diagnosis Date  . 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 Chronic abdominal pain   . Cyclical vomiting syndrome   . GASTRITIS 12/09/2009   Qualifier: Diagnosis of  By: Daphine Deutscher FNP, Zena Amos    . Gastroparesis   . 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 Nausea and vomiting    chronic, recurrent  . Polysubstance abuse [facility])     Patient Active Problem List   Diagnosis Date Noted  . Uncontrollable vomiting 03/10/2016  . Anemia 03/10/2016  . Metatarsal fracture 12/22/2015  . Essential hypertension 12/22/2015  . Night sweats 01/21/2015  . GERD (gastroesophageal reflux disease) 01/21/2015  . Gastritis 12/19/2014  . Hyperbilirubinemia 12/19/2014  . Elevated bilirubin   . Nausea & vomiting 12/14/2014  . Severe tetrahydrocannabinol (THC) dependence (HCC) 12/14/2014  . Alcohol  abuse 12/14/2014  . Cyclic vomiting syndrome 12/06/2014  . 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 Gastroparesis 05/11/2012  . Abdominal pain 05/06/2012  . Polysubstance abuse (HCC) 05/06/2012  . CHOLECYSTECTOMY, HX OF 11/25/2009    Past Surgical History:  Procedure Laterality Date  . CHOLECYSTECTOMY  11/2009        Home Medications    Prior to Admission medications   Medication Sig Start Date End Date Taking? Authorizing Provider  acetaminophen (TYLENOL) 500 MG tablet Take 500 mg by mouth daily as needed for mild pain.   Yes [provider]  Capsaicin-Menthol-Methyl Sal (CAPSAICIN-METHYL SAL-MENTHOL) 0.025-1-12 % CREA Apply 1 inch topically 2 (two) times daily. 02/19/18  Yes Kellie Shropshire, PA-C  capsaicin (ZOSTRIX) 0.025 % cream Apply topically 2 (two) times daily. Apply to your abdomen 2-3 times a day if you are having problems with nausea, vomiting and pain.  Also take a dose of Reglan and Benadryl as prescribed. 02/21/18   Arby Barrette, MD  dicyclomine (BENTYL) 20 MG tablet Take 1 tablet (20 mg total) by mouth 2 (two) times daily. Patient not taking: Reported on 02/19/2018 11/04/17   Aviva Kluver B, PA-C  diphenhydrAMINE (BENADRYL) 25 MG tablet Take 1 tablet (25 mg total) by mouth every 6 (six) hours as needed. Take with Reglan to try to control nausea and vomiting. 02/21/18  Arby Barrette, MD  lidocaine (XYLOCAINE) 2 % solution Use as directed 15 mLs every 3 (three) hours as needed in the mouth or throat for mouth pain. Patient not taking: Reported on 11/08/2017 07/01/17   McDonald, Pedro Earls A, PA-C  lisinopril (PRINIVIL,ZESTRIL) 5 MG tablet Take 1 tablet (5 mg total) by mouth daily. Patient not taking: Reported on 11/08/2017 08/10/16   Hoy Register, MD  metoCLOPramide (REGLAN) 10 MG tablet Take 1 tablet (10 mg total) by mouth every 6 (six) hours as needed for nausea (nausea/headache). 02/21/18   Arby Barrette, MD  omeprazole  (PRILOSEC) 20 MG capsule Take 1 capsule (20 mg total) by mouth daily. 02/21/18   Arby Barrette, MD  ondansetron (ZOFRAN ODT) 4 MG disintegrating tablet Take 1 tablet (4 mg total) by mouth every 8 (eight) hours as needed for nausea or vomiting. 02/19/18   Kellie Shropshire, PA-C  pantoprazole (PROTONIX) 20 MG tablet Take 1 tablet (20 mg total) by mouth daily. Patient not taking: Reported on 11/08/2017 02/04/17   Muthersbaugh, Dahlia Client, PA-C  potassium chloride SA (K-DUR,KLOR-CON) 20 MEQ tablet Take 1 tablet (20 mEq total) by mouth daily for 3 doses. Patient not taking: Reported on 11/08/2017 11/04/17 11/07/17  Elisha Ponder, PA-C    Family History Family History  Problem Relation Age of Onset  . Other Father   . Diabetes Father   . Hypertension Father     Social History Social History   Tobacco Use  . Smoking status: Current Some Day Smoker    Years: 8.00    Types: Cigars    Last attempt to quit: 12/15/2014    Years since quitting: 3.1  . Smokeless tobacco: Never Used  Substance Use Topics  . Alcohol use: No    Alcohol/week: 0.0 oz  . Drug use: Yes    Types: Marijuana    Comment: Last used: 4 days ago     Allergies   Patient has no known allergies.   Review of Systems Review of Systems 10 Systems reviewed and are negative for acute change except as noted in the HPI.   Physical Exam Updated Vital Signs BP 105/65   Pulse 76   Temp 97.6 F (36.4 C) (Oral)   Resp 18   Ht 5\' 9"  (1.753 m)   Wt 84.8 kg (187 lb)   SpO2 96%   BMI 27.62 kg/m   Physical Exam  Constitutional: He is oriented to person, place, and time. He appears well-developed and well-nourished.  Patient is alert and nontoxic.  He is grimacing and clenching his teeth and doing a full body muscle engagement with Valsalva maneuvers.  He is doing this recurrently.  No respiratory distress but has to take an periodic deep inspirations to maintain this pattern of muscle engagement.  Diaphoretic on the forehead.    HENT:  Head: Normocephalic and atraumatic.  Mouth/Throat: Oropharynx is clear and moist.  Eyes: Pupils are equal, round, and reactive to light. EOM are normal.  Neck: Neck supple.  Cardiovascular: Normal rate, regular rhythm, normal heart sounds and intact distal pulses.  Despite this physical demonstration of pain, patient does not have tachycardia.  It is regular.  Pulses symmetric and normal.  Pulmonary/Chest: Effort normal and breath sounds normal.  Abdominal: Soft. Bowel sounds are normal. He exhibits no distension. There is no tenderness.  Patient is flexing and engaging all of his abdominal wall musculature.  He endorses diffuse, epigastric pain to palpation.  Musculoskeletal: Normal range of motion. He exhibits no edema  or tenderness.  Neurological: He is alert and oriented to person, place, and time. He has normal strength. He exhibits normal muscle tone. Coordination normal. GCS eye subscore is 4. GCS verbal subscore is 5. GCS motor subscore is 6.  Skin: Skin is warm and intact.  Patient skin is warm and diaphoretic on the forehead.  Psychiatric:  Patient's behavior exhibits anxiety     ED Treatments / Results  Labs (all labs ordered are listed, but only abnormal results are displayed) Labs Reviewed  COMPREHENSIVE METABOLIC PANEL - Abnormal; Notable for the following components:      Result Value   Chloride 94 (*)    Glucose, Bld 120 (*)    BUN 22 (*)    Calcium 10.4 (*)    Total Protein 8.8 (*)    AST 50 (*)    Total Bilirubin 3.6 (*)    All other components within normal limits  CBC - Abnormal; Notable for the following components:   WBC 17.1 (*)    All other components within normal limits  URINALYSIS, ROUTINE W REFLEX MICROSCOPIC - Abnormal; Notable for the following components:   Specific Gravity, Urine 1.034 (*)    Hgb urine dipstick MODERATE (*)    Ketones, ur 20 (*)    Protein, ur 100 (*)    Bacteria, UA RARE (*)    All other components within normal  limits  LIPASE, BLOOD    EKG None  Radiology No results found.  Procedures Procedures (including critical care time)  Medications Ordered in ED Medications  sodium chloride 0.9 % bolus 1,000 mL (0 mLs Intravenous Stopped 02/21/18 1352)    Followed by  sodium chloride 0.9 % bolus 1,000 mL (0 mLs Intravenous Stopped 02/21/18 1352)    Followed by  0.9 %  sodium chloride infusion (has no administration in time range)  capsaicin (ZOSTRIX) 0.025 % cream (has no administration in time range)  ondansetron (ZOFRAN-ODT) disintegrating tablet 4 mg (4 mg Oral Given 02/21/18 1108)  haloperidol lactate (HALDOL) injection 5 mg (5 mg Intravenous Given 02/21/18 1253)  LORazepam (ATIVAN) injection 1 mg (1 mg Intravenous Given 02/21/18 1253)     Initial Impression / Assessment and Plan / ED Course  I have reviewed the triage vital signs and the nursing notes.  Pertinent labs & imaging results that were available during my care of the patient were reviewed by me and considered in my medical decision making (see chart for details).     Recheck:.  After administration of Haldol, Ativan and capsaicin cream with fluid hydration, patient is resting quietly with no further exhibition of pain or dry heaves.  Final Clinical Impressions(s) / ED Diagnoses   Final diagnoses:  Non-intractable cyclical vomiting with nausea  Cannabinoid hyperemesis syndrome [facility])   Patient presents as outlined above.  He has had multiple diagnostic evaluations and presentations for abdominal pain and intractable vomiting.  At this time, I do not have suspicion of acute surgical abdomen.  Despite patient's report of continuous vomiting and inability to tolerate oral intake, heart rate remained in the 70s, no sign of severe dehydration by BUN and creatinine, mild elevation in ketones and specific gravity of the urine.  Patient was rehydrated with 2 L normal saline.  He had his companion are counseled on the nature of cannabinoid  hyperemesis syndrome.  He is advised that it would be very difficult to discern if there is other etiology until there is sustained and prolonged discontinuation of cannabis.  At this  time, plan will be again for symptomatic treatment.  Patient is counseled to follow-up with his family doctor or with gastroenterology. ED Discharge Orders        Ordered    capsaicin (ZOSTRIX) 0.025 % cream  2 times daily     02/21/18 1533    metoCLOPramide (REGLAN) 10 MG tablet  Every 6 hours PRN     02/21/18 1533    diphenhydrAMINE (BENADRYL) 25 MG tablet  Every 6 hours PRN     02/21/18 1533    omeprazole (PRILOSEC) 20 MG capsule  Daily     02/21/18 1533       Arby BarrettePfeiffer, Sebasthian Stailey, MD 02/21/18 1547

## 2018-02-21 NOTE — Discharge Instructions (Signed)
1.  Start taking omeprazole (Prilosec) daily. 2.  You must completely stop smoking marijuana to see if your problems with chronic and recurrent abdominal pain and vomiting start to improve.  Read about cannabinoids hyperemesis syndrome in your discharge instructions. 3.  When you are starting to develop nausea, you may take a dose of Reglan and 25 mg of Benadryl and apply the capsaicin cream to your abdomen.  You may do this every 6-8 hours as needed.

## 2018-02-21 NOTE — ED Triage Notes (Signed)
Patient c/o vomiting and abdominal pain x 2 days. [facility].

## 2018-02-26 ENCOUNTER — Encounter (HOSPITAL_COMMUNITY): Payer: Self-pay | Admitting: Emergency Medicine

## 2018-02-26 ENCOUNTER — Emergency Department (HOSPITAL_COMMUNITY)
Admission: EM | Admit: 2018-02-26 | Discharge: 2018-02-26 | Disposition: A | Discharge status: Home / Self Care | Payer: Self-pay | Attending: Emergency Medicine | Admitting: Emergency Medicine

## 2018-02-26 ENCOUNTER — Emergency Department (HOSPITAL_COMMUNITY): Payer: Self-pay

## 2018-02-26 ENCOUNTER — Other Ambulatory Visit: Payer: Self-pay

## 2018-02-26 DIAGNOSIS — Z79899 Other long term (current) drug therapy: Secondary | ICD-10-CM | POA: Insufficient documentation

## 2018-02-26 DIAGNOSIS — F1210 Cannabis abuse, uncomplicated: Secondary | ICD-10-CM | POA: Insufficient documentation

## 2018-02-26 DIAGNOSIS — K219 Gastro-esophageal reflux disease without esophagitis: Secondary | ICD-10-CM | POA: Insufficient documentation

## 2018-02-26 DIAGNOSIS — F17290 Nicotine dependence, other tobacco product, uncomplicated: Secondary | ICD-10-CM | POA: Insufficient documentation

## 2018-02-26 DIAGNOSIS — I10 Essential (primary) hypertension: Secondary | ICD-10-CM | POA: Insufficient documentation

## 2018-02-26 DIAGNOSIS — R112 Nausea with vomiting, unspecified: Secondary | ICD-10-CM | POA: Insufficient documentation

## 2018-02-26 LAB — I-STAT TROPONIN, ED
Troponin i, poc: 0 ng/mL (ref 0.00–0.08)
Troponin i, poc: 0 ng/mL (ref 0.00–0.08)

## 2018-02-26 LAB — HEPATIC FUNCTION PANEL
ALT: 17 U/L (ref 0–44)
AST: 18 U/L (ref 15–41)
Albumin: 3.6 g/dL (ref 3.5–5.0)
Alkaline Phosphatase: 46 U/L (ref 38–126)
Bilirubin, Direct: 0.2 mg/dL (ref 0.0–0.2)
Indirect Bilirubin: 1.7 mg/dL — ABNORMAL HIGH (ref 0.3–0.9)
Total Bilirubin: 1.9 mg/dL — ABNORMAL HIGH (ref 0.3–1.2)
Total Protein: 6.4 g/dL — ABNORMAL LOW (ref 6.5–8.1)

## 2018-02-26 LAB — BASIC METABOLIC PANEL
Anion gap: 10 (ref 5–15)
BUN: 6 mg/dL (ref 6–20)
CO2: 26 mmol/L (ref 22–32)
Calcium: 9.1 mg/dL (ref 8.9–10.3)
Chloride: 105 mmol/L (ref 98–111)
Creatinine, Ser: 0.88 mg/dL (ref 0.61–1.24)
GFR calc Af Amer: >60 mL/min (ref >60)
GFR calc non Af Amer: >60 mL/min (ref >60)
Glucose, Bld: 120 mg/dL — ABNORMAL HIGH (ref 70–99)
Potassium: 3.6 mmol/L (ref 3.5–5.1)
Sodium: 141 mmol/L (ref 135–145)

## 2018-02-26 LAB — LIPASE, BLOOD: Lipase: 35 U/L (ref 11–51)

## 2018-02-26 LAB — CBC
HCT: 36.3 % — ABNORMAL LOW (ref 39.0–52.0)
Hemoglobin: 11.7 g/dL — ABNORMAL LOW (ref 13.0–17.0)
MCH: 31.5 pg (ref 26.0–34.0)
MCHC: 32.2 g/dL (ref 30.0–36.0)
MCV: 97.6 fL (ref 78.0–100.0)
Platelets: 245 10*3/uL (ref 150–400)
RBC: 3.72 MIL/uL — ABNORMAL LOW (ref 4.22–5.81)
RDW: 11.6 % (ref 11.5–15.5)
WBC: 8.2 10*3/uL (ref 4.0–10.5)

## 2018-02-26 MED ORDER — GI COCKTAIL ~~LOC~~
30.0000 mL | Freq: Once | ORAL | Status: AC
  Administered 2018-02-26: 30 mL via ORAL
  Filled 2018-02-26: qty 30

## 2018-02-26 MED ORDER — PANTOPRAZOLE SODIUM 20 MG PO TBEC: 20.0000 mg | DELAYED_RELEASE_TABLET | Freq: Every day | ORAL | 0 refills | Status: DC

## 2018-02-26 NOTE — ED Notes (Signed)
Gave pt a [place]key sandwich and a coke to drink, Per MD

## 2018-02-26 NOTE — Discharge Instructions (Addendum)
Antacids as prescribed.  You can also take over-the-counter Tums or Mylanta.  Follow-up with the primary care doctor for further evaluation

## 2018-02-26 NOTE — ED Provider Notes (Signed)
[facility] Provider Note   CSN: [id number] Arrival date & time: 02/26/18  1250     History   Chief Complaint Chief Complaint  Patient presents with  . Chest Pain    [name] is a 37 y.o. male.  HPI Emergency room for evaluation of chest pain.  Patient states he started having symptoms last evening.  He had a grabbing type pain and discomfort in the center of his chest.  It did not radiate.  He denies any trouble with shortness of breath.  Patient does have a history of chronic abdominal pain.  He had some nausea and vomiting last night.  He has had some loose stools but no fevers or chills.  He denies any history of heart disease.  No family history of heart disease.  He does smoke No history of pulmonary embolism.  No acute DVT.  No complaints of leg swelling Past Medical History:  Diagnosis Date  . 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 Chronic abdominal pain   . Cyclical vomiting syndrome   . GASTRITIS 12/09/2009   Qualifier: Diagnosis of  By: Daphine DeutscherMartin FNP, Zena AmosNykedtra    . Gastroparesis   . HELICOBACTER PYLORI INFECTION 12/14/2009   Qualifier: Diagnosis of  By: Levon Hedgerraddock, Brenda    . Nausea and vomiting    chronic, recurrent  . Polysubstance abuse [facility](HCC)     Patient Active Problem List   Diagnosis Date Noted  . Uncontrollable vomiting 03/10/2016  . Anemia 03/10/2016  . Metatarsal fracture 12/22/2015  . Essential hypertension 12/22/2015  . Night sweats 01/21/2015  . GERD (gastroesophageal reflux disease) 01/21/2015  . Gastritis 12/19/2014  . Hyperbilirubinemia 12/19/2014  . Elevated bilirubin   . Nausea & vomiting 12/14/2014  . Severe tetrahydrocannabinol (THC) dependence (HCC) 12/14/2014  . Alcohol abuse 12/14/2014  . Cyclic vomiting syndrome 12/06/2014  . 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 Gastroparesis 05/11/2012  . Abdominal  pain 05/06/2012  . Polysubstance abuse (HCC) 05/06/2012  . CHOLECYSTECTOMY, HX OF 11/25/2009    Past Surgical History:  Procedure Laterality Date  . CHOLECYSTECTOMY  11/2009        Home Medications    Prior to Admission medications   Medication Sig Start Date End Date Taking? Authorizing Provider  acetaminophen (TYLENOL) 500 MG tablet Take 500 mg by mouth daily as needed for mild pain.    [provider]  capsaicin (ZOSTRIX) 0.025 % cream Apply topically 2 (two) times daily. Apply to your abdomen 2-3 times a day if you are having problems with nausea, vomiting and pain.  Also take a dose of Reglan and Benadryl as prescribed. 02/21/18   Arby BarrettePfeiffer, Marcy, MD  Capsaicin-Menthol-Methyl Sal (CAPSAICIN-METHYL SAL-MENTHOL) 0.025-1-12 % CREA Apply 1 inch topically 2 (two) times daily. 02/19/18   Kellie ShropshireShrosbree, Emily J, PA-C  dicyclomine (BENTYL) 20 MG tablet Take 1 tablet (20 mg total) by mouth 2 (two) times daily. 11/04/17   Aviva KluverMurray, Alyssa B, PA-C  diphenhydrAMINE (BENADRYL) 25 MG tablet Take 1 tablet (25 mg total) by mouth every 6 (six) hours as needed. Take with Reglan to try to control nausea and vomiting. 02/21/18   Arby BarrettePfeiffer, Marcy, MD  lidocaine (XYLOCAINE) 2 % solution Use as directed 15 mLs every 3 (three) hours as needed in the mouth or throat for mouth pain. 07/01/17   McDonald, Mia A, PA-C  lisinopril (PRINIVIL,ZESTRIL) 5 MG tablet Take 1 tablet (  5 mg total) by mouth daily. 08/10/16   Hoy Register, MD  metoCLOPramide (REGLAN) 10 MG tablet Take 1 tablet (10 mg total) by mouth every 6 (six) hours as needed for nausea (nausea/headache). 02/21/18   Arby Barrette, MD  omeprazole (PRILOSEC) 20 MG capsule Take 1 capsule (20 mg total) by mouth daily. 02/21/18   Arby Barrette, MD  ondansetron (ZOFRAN ODT) 4 MG disintegrating tablet Take 1 tablet (4 mg total) by mouth every 8 (eight) hours as needed for nausea or vomiting. 02/19/18   Kellie Shropshire, PA-C  pantoprazole (PROTONIX) 20 MG tablet  Take 1 tablet (20 mg total) by mouth daily. 02/26/18   Linwood Dibbles, MD  potassium chloride SA (K-DUR,KLOR-CON) 20 MEQ tablet Take 1 tablet (20 mEq total) by mouth daily for 3 doses. 11/04/17 02/26/18  Elisha Ponder, PA-C    Family History Family History  Problem Relation Age of Onset  . Other Father   . Diabetes Father   . Hypertension Father     Social History Social History   Tobacco Use  . Smoking status: Current Some Day Smoker    Years: 8.00    Types: Cigars    Last attempt to quit: 12/15/2014    Years since quitting: 3.2  . Smokeless tobacco: Never Used  Substance Use Topics  . Alcohol use: No    Alcohol/week: 0.0 oz  . Drug use: Yes    Types: Marijuana    Comment: Last used: 4 days ago     Allergies   Patient has no known allergies.   Review of Systems Review of Systems  All other systems reviewed and are negative.    Physical Exam Updated Vital Signs BP 133/65 (BP Location: Right Arm)   Pulse 70   Temp 98.9 F (37.2 C) (Oral)   Resp 16   SpO2 100%   Physical Exam  Constitutional: He appears well-developed and well-nourished. No distress.  HENT:  Head: Normocephalic and atraumatic.  Right Ear: External ear normal.  Left Ear: External ear normal.  Eyes: Conjunctivae are normal. Right eye exhibits no discharge. Left eye exhibits no discharge. No scleral icterus.  Neck: Neck supple. No tracheal deviation present.  Cardiovascular: Normal rate, regular rhythm and intact distal pulses.  Pulmonary/Chest: Effort normal and breath sounds normal. No stridor. No respiratory distress. He has no wheezes. He has no rales.  Mild chest wall tenderness  Abdominal: Soft. Bowel sounds are normal. He exhibits no distension. There is tenderness. There is no rebound and no guarding.  Mild in the epigastric region  Musculoskeletal: He exhibits no edema or tenderness.  Neurological: He is alert. He has normal strength. No cranial nerve deficit (no facial droop, extraocular  movements intact, no slurred speech) or sensory deficit. He exhibits normal muscle tone. He displays no seizure activity. Coordination normal.  Skin: Skin is warm and dry. No rash noted.  Psychiatric: He has a normal mood and affect.  Nursing note and vitals reviewed.    ED Treatments / Results  Labs (all labs ordered are listed, but only abnormal results are displayed) Labs Reviewed  BASIC METABOLIC PANEL - Abnormal; Notable for the following components:      Result Value   Glucose, Bld 120 (*)    All other components within normal limits  CBC - Abnormal; Notable for the following components:   RBC 3.72 (*)    Hemoglobin 11.7 (*)    HCT 36.3 (*)    All other components within normal limits  HEPATIC FUNCTION PANEL - Abnormal; Notable for the following components:   Total Protein 6.4 (*)    Total Bilirubin 1.9 (*)    Indirect Bilirubin 1.7 (*)    All other components within normal limits  LIPASE, BLOOD  I-STAT TROPONIN, ED  I-STAT TROPONIN, ED    EKG EKG Interpretation  Date/Time:  Monday February 26 2018 12:50:10 EDT Ventricular Rate:  61 PR Interval:  150 QRS Duration: 94 QT Interval:  400 QTC Calculation: 402 R Axis:   30 Text Interpretation:  Normal sinus rhythm with sinus arrhythmia Possible Anterior infarct , age undetermined Abnormal ECG Since last tracing rate slower Confirmed by Linwood Dibbles [phone]) on 02/26/2018 7:52:55 PM   Radiology Dg Chest 2 View  Result Date: 02/26/2018 CLINICAL DATA:  Chest pain, shortness of breath EXAM: CHEST - 2 VIEW COMPARISON:  02/02/2017 FINDINGS: The heart size and mediastinal contours are within normal limits. Both lungs are clear. The visualized skeletal structures are unremarkable. IMPRESSION: No active cardiopulmonary disease. Electronically Signed   By: Elige Ko   On: 02/26/2018 13:35    Procedures Procedures (including critical care time)  Medications Ordered in ED Medications  gi cocktail (Maalox,Lidocaine,Donnatal) (30  mLs Oral Given 02/26/18 2141)     Initial Impression / Assessment and Plan / ED Course  I have reviewed the triage vital signs and the nursing notes.  Pertinent labs & imaging results that were available during my care of the patient were reviewed by me and considered in my medical decision making (see chart for details).  Clinical Course as of Feb 27 2144  Mon Feb 26, 2018  2143 Family asked about a referral to a PCP.  He has been seen at the [place] [facility] in the past   [JK]    Clinical Course User Index [JK] Linwood Dibbles, MD    Patient presented to the emergency room for evaluation of chest pain.  ED eval is reassuring.  Cardiac enzymes are normal.  He is low risk for heart disease.  I suspect his symptoms are related to his recurrent GI issues.  Patient was given a GI cocktail as he started having some recurrent symptoms after he ate something here.  Plan on discharge home with antacids.  Discussed outpatient follow-up Final Clinical Impressions(s) / ED Diagnoses   Final diagnoses:  Gastroesophageal reflux disease, esophagitis presence not specified    ED Discharge Orders        Ordered    pantoprazole (PROTONIX) 20 MG tablet  Daily     02/26/18 2142       Linwood Dibbles, MD 02/26/18 2147

## 2018-02-26 NOTE — ED Triage Notes (Addendum)
Pt arrives via EMS for chest pain to center of chest since last night with nausea vomiting X1. Hx of chronic abdominal pain. Pt was given zofran via 18G LAC with relief to nausea. Given 2 nitro. Pain 0/10. Chest pain is a 0/10.

## 2018-03-13 ENCOUNTER — Ambulatory Visit: Payer: Self-pay | Attending: Family Medicine | Admitting: Physician Assistant

## 2018-03-13 VITALS — BP 111/66 | HR 62 | Temp 98.9°F | Resp 18 | Ht 70.0 in | Wt 183.0 lb

## 2018-03-13 DIAGNOSIS — G43A0 Cyclical vomiting, not intractable: Secondary | ICD-10-CM

## 2018-03-13 DIAGNOSIS — K3184 Gastroparesis: Secondary | ICD-10-CM

## 2018-03-13 DIAGNOSIS — I10 Essential (primary) hypertension: Secondary | ICD-10-CM | POA: Insufficient documentation

## 2018-03-13 DIAGNOSIS — F1290 Cannabis use, unspecified, uncomplicated: Secondary | ICD-10-CM

## 2018-03-13 DIAGNOSIS — Z9119 Patient's noncompliance with other medical treatment and regimen: Secondary | ICD-10-CM | POA: Insufficient documentation

## 2018-03-13 DIAGNOSIS — Z79899 Other long term (current) drug therapy: Secondary | ICD-10-CM | POA: Insufficient documentation

## 2018-03-13 DIAGNOSIS — Z09 Encounter for follow-up examination after completed treatment for conditions other than malignant neoplasm: Secondary | ICD-10-CM | POA: Insufficient documentation

## 2018-03-13 DIAGNOSIS — G8929 Other chronic pain: Secondary | ICD-10-CM

## 2018-03-13 DIAGNOSIS — F1220 Cannabis dependence, uncomplicated: Secondary | ICD-10-CM | POA: Insufficient documentation

## 2018-03-13 DIAGNOSIS — R109 Unspecified abdominal pain: Secondary | ICD-10-CM | POA: Insufficient documentation

## 2018-03-13 MED ORDER — METOCLOPRAMIDE HCL 10 MG PO TABS: 10.0000 mg | ORAL_TABLET | Freq: Three times a day (TID) | ORAL | 0 refills | Status: DC

## 2018-03-13 MED ORDER — PANTOPRAZOLE SODIUM 40 MG PO TBEC: 40.0000 mg | DELAYED_RELEASE_TABLET | Freq: Every day | ORAL | 1 refills | Status: DC

## 2018-03-13 NOTE — Progress Notes (Signed)
Chief Complaint: ED follow up  Subjective: This is a 37 year old male with a history of chronic abdominal pain, cyclical vomiting syndrome, marijuana use, gastroparesis and hypertension who is here for emergency department follow-up.  Unfortunately he is been in the emergency department 3 times over the last month for similar symptoms.  He presents with abdominal pain with occasional dry heaves or vomiting and decreased appetite.  He normally is treated with IV fluids and symptomatic relief with as needed medications.  On different occasions he has been prescribed capsaicin cream, Zofran as needed and proton pump inhibitors.  He is noncompliant with his outpatient medication recommendations and with follow-up.  He smokes marijuana daily.  He does not believe that this is the cause of his symptoms.  He describes upper abdominal pain/hip epigastric pain without any radiation.  Difficulty swallowing food at times and difficulty keeping it down once he is able to swallow.  No diarrhea.  No constipation.  No syncope.  Multiple imaging evaluations have been within normal limits in the past.  He has never seen a gastroenterologist.  He does not have insurance. Today is a "good day". No complaints today.    ROS:  GEN: denies fever or chills, denies change in weight Skin: denies lesions or rashes HEENT: denies headache, earache, epistaxis, sore throat, or neck pain LUNGS: denies SHOB, dyspnea, PND, orthopnea CV: denies CP or palpitations ABD: denies abd pain, N or V today EXT: denies muscle spasms or swelling; no pain in lower ext, no weakness NEURO: denies numbness or tingling, denies sz, stroke or TIA   Objective:  Vitals:   03/13/18 1101  BP: 111/66  Pulse: 62  Resp: 18  Temp: 98.9 F (37.2 C)  TempSrc: Oral  SpO2: 96%  Weight: 183 lb (83 kg)  Height: 5\' 10"  (1.778 m)    Physical Exam:  General: in no acute distress Heart: Normal  s1 &s2  Regular rate and rhythm, without murmurs, rubs,  gallops. Lungs: Clear to auscultation bilaterally. Abdomen: Soft, nontender, nondistended, positive bowel sounds. Neuro: Alert, awake, oriented x3, nonfocal.   Medications: Prior to Admission medications   Medication Sig Start Date End Date Taking? Authorizing Provider  acetaminophen (TYLENOL) 500 MG tablet Take 500 mg by mouth daily as needed for mild pain.    [provider]  capsaicin (ZOSTRIX) 0.025 % cream Apply topically 2 (two) times daily. Apply to your abdomen 2-3 times a day if you are having problems with nausea, vomiting and pain.  Also take a dose of Reglan and Benadryl as prescribed. 02/21/18   Arby BarrettePfeiffer, Marcy, MD  Capsaicin-Menthol-Methyl Sal (CAPSAICIN-METHYL SAL-MENTHOL) 0.025-1-12 % CREA Apply 1 inch topically 2 (two) times daily. 02/19/18   Kellie ShropshireShrosbree, Emily J, PA-C  dicyclomine (BENTYL) 20 MG tablet Take 1 tablet (20 mg total) by mouth 2 (two) times daily. 11/04/17   Aviva KluverMurray, Alyssa B, PA-C  diphenhydrAMINE (BENADRYL) 25 MG tablet Take 1 tablet (25 mg total) by mouth every 6 (six) hours as needed. Take with Reglan to try to control nausea and vomiting. 02/21/18   Arby BarrettePfeiffer, Marcy, MD  lidocaine (XYLOCAINE) 2 % solution Use as directed 15 mLs every 3 (three) hours as needed in the mouth or throat for mouth pain. 07/01/17   McDonald, Mia A, PA-C  lisinopril (PRINIVIL,ZESTRIL) 5 MG tablet Take 1 tablet (5 mg total) by mouth daily. 08/10/16   Hoy RegisterNewlin, Enobong, MD  metoCLOPramide (REGLAN) 10 MG tablet Take 1 tablet (10 mg total) by mouth 3 (three) times daily before  meals. 03/13/18   Vivianne Master, PA-C  omeprazole (PRILOSEC) 20 MG capsule Take 1 capsule (20 mg total) by mouth daily. 02/21/18   Arby Barrette, MD  ondansetron (ZOFRAN ODT) 4 MG disintegrating tablet Take 1 tablet (4 mg total) by mouth every 8 (eight) hours as needed for nausea or vomiting. 02/19/18   Kellie Shropshire, PA-C  pantoprazole (PROTONIX) 40 MG tablet Take 1 tablet (40 mg total) by mouth daily. 03/13/18    Vivianne Master, PA-C  potassium chloride SA (K-DUR,KLOR-CON) 20 MEQ tablet Take 1 tablet (20 mEq total) by mouth daily for 3 doses. 11/04/17 02/26/18  Aviva Kluver B, PA-C    Assessment: 1. Acute on Chronic Abdominal Pain 2. Cyclical Vomiting Syndrome 3. Gastroparesis 4. Marijuana Dependence  Plan: Trial of Reglan and PPI Cont Zofran prn Cut back/cessation of MJ use financials counselor Consider referral to GI once compliance is exhibited and insurance in place  Follow up:4 weeks  The patient was given clear instructions to go to ER or return to medical center if symptoms don't improve, worsen or new problems develop. The patient verbalized understanding. The patient was told to call to get lab results if they haven't heard anything in the next week.   This note has been created with Education officer, environmental. Any transcriptional errors are unintentional.   Scot Jun, PA-C 03/13/2018, 11:18 AM

## 2018-03-13 NOTE — Patient Instructions (Signed)
Cut back your Marijuana use Give these 2 medications time to work, 1-2 weeks We will help with insurance and referrals

## 2018-04-30 ENCOUNTER — Other Ambulatory Visit: Payer: Self-pay

## 2018-04-30 ENCOUNTER — Encounter (HOSPITAL_COMMUNITY): Payer: Self-pay | Admitting: *Deleted

## 2018-04-30 ENCOUNTER — Emergency Department (HOSPITAL_COMMUNITY)
Admission: EM | Admit: 2018-04-30 | Discharge: 2018-04-30 | Disposition: A | Discharge status: Home / Self Care | Payer: Self-pay | Attending: Emergency Medicine | Admitting: Emergency Medicine

## 2018-04-30 DIAGNOSIS — R61 Generalized hyperhidrosis: Secondary | ICD-10-CM | POA: Insufficient documentation

## 2018-04-30 DIAGNOSIS — R109 Unspecified abdominal pain: Secondary | ICD-10-CM

## 2018-04-30 DIAGNOSIS — F17290 Nicotine dependence, other tobacco product, uncomplicated: Secondary | ICD-10-CM | POA: Insufficient documentation

## 2018-04-30 DIAGNOSIS — G8929 Other chronic pain: Secondary | ICD-10-CM | POA: Insufficient documentation

## 2018-04-30 DIAGNOSIS — Z79899 Other long term (current) drug therapy: Secondary | ICD-10-CM | POA: Insufficient documentation

## 2018-04-30 DIAGNOSIS — G43A0 Cyclical vomiting, not intractable: Secondary | ICD-10-CM | POA: Insufficient documentation

## 2018-04-30 DIAGNOSIS — R1084 Generalized abdominal pain: Secondary | ICD-10-CM | POA: Insufficient documentation

## 2018-04-30 DIAGNOSIS — I10 Essential (primary) hypertension: Secondary | ICD-10-CM | POA: Insufficient documentation

## 2018-04-30 LAB — URINALYSIS, ROUTINE W REFLEX MICROSCOPIC
Bilirubin Urine: NEGATIVE
Glucose, UA: 50 mg/dL — AB
Hgb urine dipstick: NEGATIVE
Ketones, ur: 20 mg/dL — AB
Leukocytes, UA: NEGATIVE
Nitrite: NEGATIVE
Protein, ur: NEGATIVE mg/dL
Specific Gravity, Urine: 1.013 (ref 1.005–1.030)
pH: 9 — ABNORMAL HIGH (ref 5.0–8.0)

## 2018-04-30 LAB — CBC WITH DIFFERENTIAL/PLATELET
Basophils Absolute: 0 10*3/uL (ref 0.0–0.1)
Basophils Relative: 0 %
Eosinophils Absolute: 0 10*3/uL (ref 0.0–0.7)
Eosinophils Relative: 0 %
HCT: 38 % — ABNORMAL LOW (ref 39.0–52.0)
Hemoglobin: 12.6 g/dL — ABNORMAL LOW (ref 13.0–17.0)
Lymphocytes Relative: 5 %
Lymphs Abs: 0.9 10*3/uL (ref 0.7–4.0)
MCH: 32 pg (ref 26.0–34.0)
MCHC: 33.2 g/dL (ref 30.0–36.0)
MCV: 96.4 fL (ref 78.0–100.0)
Monocytes Absolute: 0.8 10*3/uL (ref 0.1–1.0)
Monocytes Relative: 5 %
Neutro Abs: 14.7 10*3/uL — ABNORMAL HIGH (ref 1.7–7.7)
Neutrophils Relative %: 90 %
Platelets: 289 10*3/uL (ref 150–400)
RBC: 3.94 MIL/uL — ABNORMAL LOW (ref 4.22–5.81)
RDW: 12.6 % (ref 11.5–15.5)
WBC: 16.3 10*3/uL — ABNORMAL HIGH (ref 4.0–10.5)

## 2018-04-30 LAB — COMPREHENSIVE METABOLIC PANEL
ALT: 18 U/L (ref 0–44)
AST: 33 U/L (ref 15–41)
Albumin: 4.8 g/dL (ref 3.5–5.0)
Alkaline Phosphatase: 62 U/L (ref 38–126)
Anion gap: 17 — ABNORMAL HIGH (ref 5–15)
BUN: 14 mg/dL (ref 6–20)
CO2: 22 mmol/L (ref 22–32)
Calcium: 10.4 mg/dL — ABNORMAL HIGH (ref 8.9–10.3)
Chloride: 108 mmol/L (ref 98–111)
Creatinine, Ser: 1.05 mg/dL (ref 0.61–1.24)
GFR calc Af Amer: >60 mL/min (ref >60)
GFR calc non Af Amer: >60 mL/min (ref >60)
Glucose, Bld: 155 mg/dL — ABNORMAL HIGH (ref 70–99)
Potassium: 3.6 mmol/L (ref 3.5–5.1)
Sodium: 147 mmol/L — ABNORMAL HIGH (ref 135–145)
Total Bilirubin: 1.9 mg/dL — ABNORMAL HIGH (ref 0.3–1.2)
Total Protein: 8 g/dL (ref 6.5–8.1)

## 2018-04-30 LAB — I-STAT TROPONIN, ED: Troponin i, poc: 0 ng/mL (ref 0.00–0.08)

## 2018-04-30 LAB — LIPASE, BLOOD: Lipase: 21 U/L (ref 11–51)

## 2018-04-30 LAB — ETHANOL: Alcohol, Ethyl (B): <10 mg/dL (ref <10)

## 2018-04-30 MED ORDER — ONDANSETRON 4 MG PO TBDP: 4.0000 mg | ORAL_TABLET | Freq: Three times a day (TID) | ORAL | 0 refills | Status: DC | PRN

## 2018-04-30 MED ORDER — LACTATED RINGERS IV BOLUS
1000.0000 mL | Freq: Once | INTRAVENOUS | Status: AC
  Administered 2018-04-30: 1000 mL via INTRAVENOUS

## 2018-04-30 MED ORDER — LORAZEPAM 2 MG/ML IJ SOLN
1.0000 mg | Freq: Once | INTRAMUSCULAR | Status: AC
  Administered 2018-04-30: 1 mg via INTRAVENOUS
  Filled 2018-04-30: qty 1

## 2018-04-30 MED ORDER — HALOPERIDOL LACTATE 5 MG/ML IJ SOLN
5.0000 mg | Freq: Once | INTRAMUSCULAR | Status: AC
  Administered 2018-04-30: 5 mg via INTRAVENOUS
  Filled 2018-04-30: qty 1

## 2018-04-30 NOTE — ED Triage Notes (Signed)
Pt bib EMS and coming from home.  Pt presents with abd pain, n/v since 3 am.  Pt had chipotle last night but has had hx of abd pain d/t THC.  Pt stated to EMS that he last smoked 4 days ago. Pt a/o x 4 and ambulatory.

## 2018-04-30 NOTE — ED Notes (Signed)
At this time PT still unable to provide urine sample 

## 2018-04-30 NOTE — ED Provider Notes (Signed)
[facility]-EMERGENCY DEPT Provider Note   CSN: [id number] Arrival date & time: 04/30/18  4098     History   Chief Complaint Chief Complaint  Patient presents with  . Abdominal Pain    HPI PRANISH AKHAVAN is a 37 y.o. male.  37 year old male with past medical history including chronic abdominal pain, cyclical vomiting syndrome, H. pylori, chronic marijuana use who presents with abdominal pain and vomiting.  Patient woke up at 3 AM with severe, generalized, constant abdominal pain associated with nausea and vomiting.  He has had the same symptoms previously related to marijuana use. Last THC use 4 days ago.  States that he ate Chipotle last night. Denies any other drug use. No fevers or diarrhea. No chest pain.   The history is provided by the patient.  Abdominal Pain      Past Medical History:  Diagnosis Date  . 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 Chronic abdominal pain   . Cyclical vomiting syndrome   . GASTRITIS 12/09/2009   Qualifier: Diagnosis of  By: Daphine Deutscher FNP, Zena Amos    . Gastroparesis   . 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 Nausea and vomiting    chronic, recurrent  . Polysubstance abuse [facility])     Patient Active Problem List   Diagnosis Date Noted  . Uncontrollable vomiting 03/10/2016  . Anemia 03/10/2016  . Metatarsal fracture 12/22/2015  . Essential hypertension 12/22/2015  . Night sweats 01/21/2015  . GERD (gastroesophageal reflux disease) 01/21/2015  . Gastritis 12/19/2014  . Hyperbilirubinemia 12/19/2014  . Elevated bilirubin   . Nausea & vomiting 12/14/2014  . Severe tetrahydrocannabinol (THC) dependence (HCC) 12/14/2014  . Alcohol abuse 12/14/2014  . Cyclic vomiting syndrome 12/06/2014  . 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 Gastroparesis 05/11/2012  . Abdominal pain 05/06/2012    . Polysubstance abuse (HCC) 05/06/2012  . CHOLECYSTECTOMY, HX OF 11/25/2009    Past Surgical History:  Procedure Laterality Date  . CHOLECYSTECTOMY  11/2009        Home Medications    Prior to Admission medications   Medication Sig Start Date End Date Taking? Authorizing Provider  acetaminophen (TYLENOL) 500 MG tablet Take 500 mg by mouth daily as needed for mild pain.    [provider]  capsaicin (ZOSTRIX) 0.025 % cream Apply topically 2 (two) times daily. Apply to your abdomen 2-3 times a day if you are having problems with nausea, vomiting and pain.  Also take a dose of Reglan and Benadryl as prescribed. 02/21/18   Arby Barrette, MD  Capsaicin-Menthol-Methyl Sal (CAPSAICIN-METHYL SAL-MENTHOL) 0.025-1-12 % CREA Apply 1 inch topically 2 (two) times daily. 02/19/18   Kellie Shropshire, PA-C  dicyclomine (BENTYL) 20 MG tablet Take 1 tablet (20 mg total) by mouth 2 (two) times daily. 11/04/17   Aviva Kluver B, PA-C  diphenhydrAMINE (BENADRYL) 25 MG tablet Take 1 tablet (25 mg total) by mouth every 6 (six) hours as needed. Take with Reglan to try to control nausea and vomiting. 02/21/18   Arby Barrette, MD  lidocaine (XYLOCAINE) 2 % solution Use as directed 15 mLs every 3 (three) hours as needed in the mouth or throat for mouth pain. 07/01/17   McDonald, Mia A, PA-C  lisinopril (PRINIVIL,ZESTRIL) 5 MG tablet Take 1 tablet (5 mg total) by mouth daily. 08/10/16   Hoy Register, MD  metoCLOPramide (REGLAN) 10 MG tablet Take 1 tablet (10 mg total) by mouth 3 (three) times daily before meals. 03/13/18   Vivianne Master, PA-C  omeprazole (PRILOSEC) 20 MG capsule Take 1 capsule (20 mg total) by mouth daily. 02/21/18   Arby Barrette, MD  ondansetron (ZOFRAN ODT) 4 MG disintegrating tablet Take 1 tablet (4 mg total) by mouth every 8 (eight) hours as needed for nausea or vomiting. 04/30/18   Demetric Parslow, Ambrose Finland, MD  pantoprazole (PROTONIX) 40 MG tablet Take 1 tablet (40 mg total) by  mouth daily. 03/13/18   Vivianne Master, PA-C  potassium chloride SA (K-DUR,KLOR-CON) 20 MEQ tablet Take 1 tablet (20 mEq total) by mouth daily for 3 doses. 11/04/17 02/26/18  Elisha Ponder, PA-C    Family History Family History  Problem Relation Age of Onset  . Other Father   . Diabetes Father   . Hypertension Father     Social History Social History   Tobacco Use  . Smoking status: Current Some Day Smoker    Years: 8.00    Types: Cigars    Last attempt to quit: 12/15/2014    Years since quitting: 3.3  . Smokeless tobacco: Never Used  Substance Use Topics  . Alcohol use: No    Alcohol/week: 0.0 standard drinks  . Drug use: Yes    Types: Marijuana    Comment: Last used: 4 days ago     Allergies   Patient has no known allergies.   Review of Systems Review of Systems  Gastrointestinal: Positive for abdominal pain.   All other systems reviewed and are negative except that which was mentioned in HPI   Physical Exam Updated Vital Signs BP (!) 159/82   Pulse (!) 59   Temp 98.1 F (36.7 C) (Oral)   Resp 16   Ht 5\' 7"  (1.702 m)   Wt 83 kg   SpO2 100%   BMI 28.66 kg/m   Physical Exam  Constitutional: He is oriented to person, place, and time. He appears well-developed and well-nourished. He appears distressed.  Laying prone on floor with shirt off  HENT:  Head: Normocephalic and atraumatic.  Eyes: Conjunctivae are normal.  Neck: Neck supple.  Cardiovascular: Normal rate, regular rhythm and normal heart sounds.  No murmur heard. Pulmonary/Chest: Effort normal and breath sounds normal.  Abdominal: Bowel sounds are normal. He exhibits no distension.  Difficult exam 2/2 patient cooperation; generalized abdominal tenderness with voluntary guarding, no distention   Musculoskeletal: He exhibits no edema.  Neurological: He is alert and oriented to person, place, and time.  Fluent speech  Skin: Skin is warm. He is diaphoretic.  Psychiatric: Judgment normal.   distressed  Nursing note and vitals reviewed.    ED Treatments / Results  Labs (all labs ordered are listed, but only abnormal results are displayed) Labs Reviewed  COMPREHENSIVE METABOLIC PANEL - Abnormal; Notable for the following components:      Result Value   Sodium 147 (*)    Glucose, Bld 155 (*)    Calcium 10.4 (*)    Total Bilirubin 1.9 (*)    Anion gap 17 (*)    All other components within normal limits  CBC WITH DIFFERENTIAL/PLATELET - Abnormal; Notable for the following components:   WBC 16.3 (*)    RBC 3.94 (*)    Hemoglobin 12.6 (*)    HCT 38.0 (*)    Neutro Abs 14.7 (*)    All other components within normal limits  ETHANOL  LIPASE, BLOOD  URINALYSIS, ROUTINE W REFLEX MICROSCOPIC  I-STAT TROPONIN, ED    EKG EKG Interpretation  Date/Time:  Monday April 30 2018 09:22:43 EDT Ventricular Rate:  67 PR Interval:    QRS Duration: 94 QT Interval:  372 QTC Calculation: 393 R Axis:   49 Text Interpretation:  Sinus rhythm Atrial premature complex RSR' in V1 or V2, probably normal variant Consider left ventricular hypertrophy peaked T waves compared to previous Confirmed by Frederick PeersLittle, Ashling Roane [phone]([id number]) on 04/30/2018 9:29:32 AM   Radiology No results found.  Procedures Procedures (including critical care time)  Medications Ordered in ED Medications  haloperidol lactate (HALDOL) injection 5 mg (5 mg Intravenous Given 04/30/18 0928)  lactated ringers bolus 1,000 mL ( Intravenous Stopped 04/30/18 1027)  LORazepam (ATIVAN) injection 1 mg (1 mg Intravenous Given 04/30/18 0940)     Initial Impression / Assessment and Plan / ED Course  I have reviewed the triage vital signs and the nursing notes.  Pertinent labs & imaging results that were available during my care of the patient were reviewed by me and considered in my medical decision making (see chart for details).    Pt laying on floor with shirt off when I arrived to room, he stated that cold floor made abdomen  feel better. Applied ice packs to abdomen, gave haldol and IVF bolus. EKG without ischemic changes.  Lab work overall reassuring, glucose 155, normal creatinine, anion gap 17 likely secondary to dehydration.  WBC 16.3 but patient often has mild leukocytosis.  After receiving IV fluids, Haldol, and later Ativan, the patient was resting comfortably on reassessment.  He has been able to drink Coke with no problems. Given he states this is exactly like previous episodes he's had, I do not feel he needs abdominal imaging today.  Discussed supportive measures and return precautions.  Final Clinical Impressions(s) / ED Diagnoses   Final diagnoses:  Cyclical vomiting with nausea, intractability of vomiting not specified  Chronic abdominal pain    ED Discharge Orders         Ordered    ondansetron (ZOFRAN ODT) 4 MG disintegrating tablet  Every 8 hours PRN     04/30/18 1326           Sarann Tregre, Ambrose Finlandachel Morgan, MD 04/30/18 1328

## 2018-04-30 NOTE — ED Notes (Signed)
Bed: [id number] Expected date:  Expected time:  Means of arrival:  Comments: EMS-male abd pains

## 2018-04-30 NOTE — Discharge Instructions (Addendum)
Stop using all marijuana as it likely causes your vomiting and abdominal pain.

## 2018-04-30 NOTE — ED Notes (Signed)
Bed: WHALB Expected date:  Expected time:  Means of arrival:  Comments: 

## 2018-04-30 NOTE — ED Notes (Signed)
Attempted to discharge pt and arrange for ride. Pt is too sleepy for discharge at this time. No family or ride could be contacted.  Will continue to monitor

## 2018-05-02 ENCOUNTER — Emergency Department (HOSPITAL_COMMUNITY)
Admission: EM | Admit: 2018-05-02 | Discharge: 2018-05-02 | Disposition: A | Discharge status: Home / Self Care | Payer: Self-pay | Attending: Emergency Medicine | Admitting: Emergency Medicine

## 2018-05-02 ENCOUNTER — Other Ambulatory Visit: Payer: Self-pay

## 2018-05-02 ENCOUNTER — Encounter (HOSPITAL_COMMUNITY): Payer: Self-pay

## 2018-05-02 DIAGNOSIS — F1290 Cannabis use, unspecified, uncomplicated: Secondary | ICD-10-CM | POA: Insufficient documentation

## 2018-05-02 DIAGNOSIS — I10 Essential (primary) hypertension: Secondary | ICD-10-CM | POA: Insufficient documentation

## 2018-05-02 DIAGNOSIS — F17210 Nicotine dependence, cigarettes, uncomplicated: Secondary | ICD-10-CM | POA: Insufficient documentation

## 2018-05-02 DIAGNOSIS — R1084 Generalized abdominal pain: Secondary | ICD-10-CM | POA: Insufficient documentation

## 2018-05-02 DIAGNOSIS — G43A0 Cyclical vomiting, not intractable: Secondary | ICD-10-CM | POA: Insufficient documentation

## 2018-05-02 DIAGNOSIS — R109 Unspecified abdominal pain: Secondary | ICD-10-CM

## 2018-05-02 DIAGNOSIS — G8929 Other chronic pain: Secondary | ICD-10-CM | POA: Insufficient documentation

## 2018-05-02 LAB — CBC WITH DIFFERENTIAL/PLATELET
Basophils Absolute: 0 10*3/uL (ref 0.0–0.1)
Basophils Relative: 0 %
Eosinophils Absolute: 0 10*3/uL (ref 0.0–0.7)
Eosinophils Relative: 0 %
HCT: 40.7 % (ref 39.0–52.0)
Hemoglobin: 14.1 g/dL (ref 13.0–17.0)
Lymphocytes Relative: 7 %
Lymphs Abs: 0.8 10*3/uL (ref 0.7–4.0)
MCH: 32.7 pg (ref 26.0–34.0)
MCHC: 34.6 g/dL (ref 30.0–36.0)
MCV: 94.4 fL (ref 78.0–100.0)
Monocytes Absolute: 0.7 10*3/uL (ref 0.1–1.0)
Monocytes Relative: 5 %
Neutro Abs: 11 10*3/uL — ABNORMAL HIGH (ref 1.7–7.7)
Neutrophils Relative %: 88 %
Platelets: 309 10*3/uL (ref 150–400)
RBC: 4.31 MIL/uL (ref 4.22–5.81)
RDW: 12.5 % (ref 11.5–15.5)
WBC: 12.5 10*3/uL — ABNORMAL HIGH (ref 4.0–10.5)

## 2018-05-02 LAB — LIPASE, BLOOD: Lipase: 22 U/L (ref 11–51)

## 2018-05-02 LAB — URINALYSIS, ROUTINE W REFLEX MICROSCOPIC
Bacteria, UA: NONE SEEN
Bilirubin Urine: NEGATIVE
Glucose, UA: NEGATIVE mg/dL
Hgb urine dipstick: NEGATIVE
Ketones, ur: 20 mg/dL — AB
Leukocytes, UA: NEGATIVE
Nitrite: NEGATIVE
Protein, ur: 100 mg/dL — AB
Specific Gravity, Urine: 1.032 — ABNORMAL HIGH (ref 1.005–1.030)
pH: 5 (ref 5.0–8.0)

## 2018-05-02 LAB — COMPREHENSIVE METABOLIC PANEL
ALT: 24 U/L (ref 0–44)
AST: 47 U/L — ABNORMAL HIGH (ref 15–41)
Albumin: 5 g/dL (ref 3.5–5.0)
Alkaline Phosphatase: 63 U/L (ref 38–126)
Anion gap: 15 (ref 5–15)
BUN: 27 mg/dL — ABNORMAL HIGH (ref 6–20)
CO2: 26 mmol/L (ref 22–32)
Calcium: 10.4 mg/dL — ABNORMAL HIGH (ref 8.9–10.3)
Chloride: 101 mmol/L (ref 98–111)
Creatinine, Ser: 1.14 mg/dL (ref 0.61–1.24)
GFR calc Af Amer: >60 mL/min (ref >60)
GFR calc non Af Amer: >60 mL/min (ref >60)
Glucose, Bld: 108 mg/dL — ABNORMAL HIGH (ref 70–99)
Potassium: 4.1 mmol/L (ref 3.5–5.1)
Sodium: 142 mmol/L (ref 135–145)
Total Bilirubin: 3 mg/dL — ABNORMAL HIGH (ref 0.3–1.2)
Total Protein: 8.6 g/dL — ABNORMAL HIGH (ref 6.5–8.1)

## 2018-05-02 LAB — RAPID URINE DRUG SCREEN, HOSP PERFORMED
Amphetamines: NOT DETECTED
Barbiturates: NOT DETECTED
Benzodiazepines: NOT DETECTED
Cocaine: NOT DETECTED
Opiates: NOT DETECTED
Tetrahydrocannabinol: POSITIVE — AB

## 2018-05-02 LAB — I-STAT CG4 LACTIC ACID, ED
Lactic Acid, Venous: 3.68 mmol/L (ref 0.5–1.9)
Lactic Acid, Venous: 1.84 mmol/L (ref 0.5–1.9)

## 2018-05-02 MED ORDER — HALOPERIDOL LACTATE 5 MG/ML IJ SOLN
5.0000 mg | Freq: Once | INTRAMUSCULAR | Status: AC
  Administered 2018-05-02: 5 mg via INTRAVENOUS
  Filled 2018-05-02: qty 1

## 2018-05-02 MED ORDER — SODIUM CHLORIDE 0.9 % IV BOLUS
1000.0000 mL | Freq: Once | INTRAVENOUS | Status: AC
  Administered 2018-05-02: 1000 mL via INTRAVENOUS

## 2018-05-02 MED ORDER — LORAZEPAM 2 MG/ML IJ SOLN
1.0000 mg | Freq: Once | INTRAMUSCULAR | Status: AC
  Administered 2018-05-02: 1 mg via INTRAVENOUS
  Filled 2018-05-02: qty 1

## 2018-05-02 MED ORDER — CAPSAICIN 0.025 % EX CREA
TOPICAL_CREAM | Freq: Once | CUTANEOUS | Status: AC
  Administered 2018-05-02: 11:00:00 via TOPICAL
  Filled 2018-05-02: qty 60

## 2018-05-02 NOTE — ED Notes (Signed)
Dr. Clarene DukeLittle notified of patient's lactic acid result of 3.68.

## 2018-05-02 NOTE — ED Notes (Signed)
Patient has very strong odor of Marijuana coming from room. Pt has been instructed that if patient continues to smoke marijuana that stomach pain will happen when patient smokes marijuana.

## 2018-05-02 NOTE — ED Notes (Signed)
Pt is alert and orinted x 4 and is verbally responsive.Pt reports generalized abdominal pain 7/10 at this time. Second bolus administered , urine collected, second lactic to be collected post second bolus.

## 2018-05-02 NOTE — ED Notes (Signed)
ED Provider at bedside. 

## 2018-05-02 NOTE — ED Provider Notes (Signed)
[facility]-EMERGENCY DEPT Provider Note   CSN: [id number] Arrival date & time: 05/02/18  4098     History   Chief Complaint Chief Complaint  Patient presents with  . Abdominal Pain    [name] is a 37 y.o. male.  37 year old male with past medical history including biliary dyskinesia, cyclical vomiting syndrome, chronic abdominal pain, chronic marijuana use who presents with abdominal pain and vomiting.  The patient presented here 2 days ago for the symptoms and I treated him in the ED and discharged him home.  He states that he was fine yesterday and able to eat and drink normally.  This morning just prior to arrival he had sudden onset of generalized abdominal pain along with vomiting just prior to EMS arrival.  He reports constipation, no diarrhea or blood in his stool.  He has not urinated today but denies any pain or hematuria.  No fevers.  He states that the marijuana smell from his closed is from people smoking around him.  He has not used in a day and a half.  The history is provided by the patient.  Abdominal Pain      Past Medical History:  Diagnosis Date  . 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 Chronic abdominal pain   . Cyclical vomiting syndrome   . GASTRITIS 12/09/2009   Qualifier: Diagnosis of  By: Daphine Deutscher FNP, Zena Amos    . Gastroparesis   . 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 Nausea and vomiting    chronic, recurrent  . Polysubstance abuse [facility])     Patient Active Problem List   Diagnosis Date Noted  . Uncontrollable vomiting 03/10/2016  . Anemia 03/10/2016  . Metatarsal fracture 12/22/2015  . Essential hypertension 12/22/2015  . Night sweats 01/21/2015  . GERD (gastroesophageal reflux disease) 01/21/2015  . Gastritis 12/19/2014  . Hyperbilirubinemia 12/19/2014  . Elevated bilirubin   . Nausea & vomiting 12/14/2014    . Severe tetrahydrocannabinol (THC) dependence (HCC) 12/14/2014  . Alcohol abuse 12/14/2014  . Cyclic vomiting syndrome 12/06/2014  . 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 Gastroparesis 05/11/2012  . Abdominal pain 05/06/2012  . Polysubstance abuse (HCC) 05/06/2012  . CHOLECYSTECTOMY, HX OF 11/25/2009    Past Surgical History:  Procedure Laterality Date  . CHOLECYSTECTOMY  11/2009        Home Medications    Prior to Admission medications   Medication Sig Start Date End Date Taking? Authorizing Provider  ondansetron (ZOFRAN ODT) 4 MG disintegrating tablet Take 1 tablet (4 mg total) by mouth every 8 (eight) hours as needed for nausea or vomiting. 04/30/18  Yes Almer Littleton, Ambrose Finland, MD  capsaicin (ZOSTRIX) 0.025 % cream Apply topically 2 (two) times daily. Apply to your abdomen 2-3 times a day if you are having problems with nausea, vomiting and pain.  Also take a dose of Reglan and Benadryl as prescribed. Patient not taking: Reported on 05/02/2018 02/21/18   Arby Barrette, MD  Capsaicin-Menthol-Methyl Sal (CAPSAICIN-METHYL SAL-MENTHOL) 0.025-1-12 % CREA Apply 1 inch topically 2 (two) times daily. Patient not taking: Reported on 05/02/2018 02/19/18   Kellie Shropshire, PA-C  dicyclomine (BENTYL) 20 MG tablet Take 1 tablet (20 mg total) by mouth 2 (two) times daily. Patient not taking: Reported on 05/02/2018 11/04/17   Aviva Kluver B, PA-C  diphenhydrAMINE (BENADRYL) 25 MG tablet  Take 1 tablet (25 mg total) by mouth every 6 (six) hours as needed. Take with Reglan to try to control nausea and vomiting. Patient not taking: Reported on 05/02/2018 02/21/18   Arby BarrettePfeiffer, Marcy, MD  lidocaine (XYLOCAINE) 2 % solution Use as directed 15 mLs every 3 (three) hours as needed in the mouth or throat for mouth pain. Patient not taking: Reported on 05/02/2018 07/01/17   McDonald, Mia A, PA-C  lisinopril (PRINIVIL,ZESTRIL) 5 MG tablet Take 1 tablet (5 mg total) by  mouth daily. Patient not taking: Reported on 05/02/2018 08/10/16   Hoy RegisterNewlin, Enobong, MD  metoCLOPramide (REGLAN) 10 MG tablet Take 1 tablet (10 mg total) by mouth 3 (three) times daily before meals. Patient not taking: Reported on 05/02/2018 03/13/18   Vivianne MasterNoel, Tiffany S, PA-C  omeprazole (PRILOSEC) 20 MG capsule Take 1 capsule (20 mg total) by mouth daily. Patient not taking: Reported on 05/02/2018 02/21/18   Arby BarrettePfeiffer, Marcy, MD  pantoprazole (PROTONIX) 40 MG tablet Take 1 tablet (40 mg total) by mouth daily. Patient not taking: Reported on 05/02/2018 03/13/18   Vivianne MasterNoel, Tiffany S, PA-C  potassium chloride SA (K-DUR,KLOR-CON) 20 MEQ tablet Take 1 tablet (20 mEq total) by mouth daily for 3 doses. Patient not taking: Reported on 05/02/2018 11/04/17 05/02/18  Elisha PonderMurray, Alyssa B, PA-C    Family History Family History  Problem Relation Age of Onset  . Other Father   . Diabetes Father   . Hypertension Father     Social History Social History   Tobacco Use  . Smoking status: Current Some Day Smoker    Years: 8.00    Types: Cigars    Last attempt to quit: 12/15/2014    Years since quitting: 3.3  . Smokeless tobacco: Never Used  Substance Use Topics  . Alcohol use: No    Alcohol/week: 0.0 standard drinks  . Drug use: Yes    Types: Marijuana    Comment: Last used: 4 days ago per patient on 04/30/2018     Allergies   Patient has no known allergies.   Review of Systems Review of Systems  Gastrointestinal: Positive for abdominal pain.   All other systems reviewed and are negative except that which was mentioned in HPI   Physical Exam Updated Vital Signs BP (!) 164/97 (BP Location: Right Arm)   Pulse 69   Resp 14   Ht 5\' 7"  (1.702 m)   Wt 83 kg   SpO2 98%   BMI 28.66 kg/m   Physical Exam  Constitutional: He is oriented to person, place, and time. He appears well-developed and well-nourished.  Laying supine in boxer shorts on floor  HENT:  Head: Normocephalic and atraumatic.  Moist  mucous membranes  Eyes: Conjunctivae are normal.  Neck: Neck supple.  Cardiovascular: Normal rate, regular rhythm and normal heart sounds.  No murmur heard. Pulmonary/Chest: Effort normal and breath sounds normal.  Abdominal: Soft. Bowel sounds are normal. He exhibits no distension. There is no tenderness.  Musculoskeletal: He exhibits no edema.  Neurological: He is alert and oriented to person, place, and time.  Fluent speech  Skin: Skin is warm and dry.  Psychiatric: Judgment normal.  Bizarre affect, laying on floor in underwear, strong smell of marijuana  Nursing note and vitals reviewed.    ED Treatments / Results  Labs (all labs ordered are listed, but only abnormal results are displayed) Labs Reviewed  COMPREHENSIVE METABOLIC PANEL - Abnormal; Notable for the following components:      Result Value  Glucose, Bld 108 (*)    BUN 27 (*)    Calcium 10.4 (*)    Total Protein 8.6 (*)    AST 47 (*)    Total Bilirubin 3.0 (*)    All other components within normal limits  CBC WITH DIFFERENTIAL/PLATELET - Abnormal; Notable for the following components:   WBC 12.5 (*)    Neutro Abs 11.0 (*)    All other components within normal limits  RAPID URINE DRUG SCREEN, HOSP PERFORMED - Abnormal; Notable for the following components:   Tetrahydrocannabinol POSITIVE (*)    All other components within normal limits  URINALYSIS, ROUTINE W REFLEX MICROSCOPIC - Abnormal; Notable for the following components:   Color, Urine AMBER (*)    Specific Gravity, Urine 1.032 (*)    Ketones, ur 20 (*)    Protein, ur 100 (*)    All other components within normal limits  I-STAT CG4 LACTIC ACID, ED - Abnormal; Notable for the following components:   Lactic Acid, Venous 3.68 (*)    All other components within normal limits  LIPASE, BLOOD  I-STAT CG4 LACTIC ACID, ED    EKG None  Radiology No results found.  Procedures Procedures (including critical care time)  Medications Ordered in  ED Medications  sodium chloride 0.9 % bolus 1,000 mL (0 mLs Intravenous Stopped 05/02/18 1132)  haloperidol lactate (HALDOL) injection 5 mg (5 mg Intravenous Given 05/02/18 1048)  capsaicin (ZOSTRIX) 0.025 % cream ( Topical Given 05/02/18 1048)  sodium chloride 0.9 % bolus 1,000 mL (0 mLs Intravenous Stopped 05/02/18 1251)  LORazepam (ATIVAN) injection 1 mg (1 mg Intravenous Given 05/02/18 1242)     Initial Impression / Assessment and Plan / ED Course  I have reviewed the triage vital signs and the nursing notes.  Pertinent labs & imaging results that were available during my care of the patient were reviewed by me and considered in my medical decision making (see chart for details).     I cared for this patient 2 days ago for similar symptoms. His symptoms had resolved but began again this morning. He smelled very strongly of marijuana and I counseled the patient that his symptoms will continue to recur if he continues to use marijuana.  Lab work shows UA suggestive of dehydration, initial lactate 3.68 which improved after fluid boluses, BUN 27, creatinine 1.14, WBC 12.5, patient has chronic mild leukocytosis.  Normal LFTs and lipase.  Of note, patient has 12 CT abd/pelvis exams listed in our system.  Because he has no focal abdominal pain today and his symptoms are similar to previous, I do not feel he would benefit from CT imaging.  On repeat exam after above medications, the patient was resting comfortably.  He has had a full cup of apple juice and some crackers with no problems and feels better.  Extensively counseled the patient on importance of marijuana cessation and supportive measures at home.  Patient discharged in satisfactory condition. Final Clinical Impressions(s) / ED Diagnoses   Final diagnoses:  Cyclical vomiting with nausea, intractability of vomiting not specified  Chronic abdominal pain    ED Discharge Orders    None       Sephira Zellman, Ambrose Finland, MD 05/02/18  1527

## 2018-05-02 NOTE — ED Notes (Signed)
Patient is laying in floor listening to headphones.

## 2018-05-02 NOTE — ED Notes (Signed)
Bed: [id number] Expected date:  Expected time:  Means of arrival:  Comments: EMS=-male, abd pain-irregular BP

## 2018-05-02 NOTE — ED Triage Notes (Signed)
Pt comes from home. Pt arrived via [facility]. Pt called 911 due to LRQ abdominal pain that has been going on for 2 days and has increased in severity. Pt was seen recently at facility. Pt has previous hx of THC use. Pt is AOx4 and ambulatory.

## 2018-05-03 ENCOUNTER — Ambulatory Visit: Payer: Self-pay | Admitting: Family Medicine

## 2018-05-04 ENCOUNTER — Emergency Department (HOSPITAL_COMMUNITY)
Admission: EM | Admit: 2018-05-04 | Discharge: 2018-05-04 | Disposition: A | Discharge status: Home / Self Care | Payer: Self-pay | Attending: Emergency Medicine | Admitting: Emergency Medicine

## 2018-05-04 ENCOUNTER — Other Ambulatory Visit: Payer: Self-pay

## 2018-05-04 ENCOUNTER — Encounter (HOSPITAL_COMMUNITY): Payer: Self-pay | Admitting: Emergency Medicine

## 2018-05-04 DIAGNOSIS — F12988 Cannabis use, unspecified with other cannabis-induced disorder: Secondary | ICD-10-CM

## 2018-05-04 DIAGNOSIS — R112 Nausea with vomiting, unspecified: Secondary | ICD-10-CM

## 2018-05-04 DIAGNOSIS — F1290 Cannabis use, unspecified, uncomplicated: Secondary | ICD-10-CM

## 2018-05-04 DIAGNOSIS — F17210 Nicotine dependence, cigarettes, uncomplicated: Secondary | ICD-10-CM | POA: Insufficient documentation

## 2018-05-04 DIAGNOSIS — I10 Essential (primary) hypertension: Secondary | ICD-10-CM | POA: Insufficient documentation

## 2018-05-04 DIAGNOSIS — F12188 Cannabis abuse with other cannabis-induced disorder: Secondary | ICD-10-CM | POA: Insufficient documentation

## 2018-05-04 DIAGNOSIS — Z79899 Other long term (current) drug therapy: Secondary | ICD-10-CM | POA: Insufficient documentation

## 2018-05-04 LAB — COMPREHENSIVE METABOLIC PANEL
ALT: 29 U/L (ref 0–44)
AST: 37 U/L (ref 15–41)
Albumin: 4.5 g/dL (ref 3.5–5.0)
Alkaline Phosphatase: 58 U/L (ref 38–126)
Anion gap: 12 (ref 5–15)
BUN: 20 mg/dL (ref 6–20)
CO2: 26 mmol/L (ref 22–32)
Calcium: 9.9 mg/dL (ref 8.9–10.3)
Chloride: 106 mmol/L (ref 98–111)
Creatinine, Ser: 1.1 mg/dL (ref 0.61–1.24)
GFR calc Af Amer: >60 mL/min (ref >60)
GFR calc non Af Amer: >60 mL/min (ref >60)
Glucose, Bld: 105 mg/dL — ABNORMAL HIGH (ref 70–99)
Potassium: 3.6 mmol/L (ref 3.5–5.1)
Sodium: 144 mmol/L (ref 135–145)
Total Bilirubin: 2.5 mg/dL — ABNORMAL HIGH (ref 0.3–1.2)
Total Protein: 7.6 g/dL (ref 6.5–8.1)

## 2018-05-04 LAB — CBC
HCT: 37.7 % — ABNORMAL LOW (ref 39.0–52.0)
Hemoglobin: 12.9 g/dL — ABNORMAL LOW (ref 13.0–17.0)
MCH: 32.3 pg (ref 26.0–34.0)
MCHC: 34.2 g/dL (ref 30.0–36.0)
MCV: 94.5 fL (ref 78.0–100.0)
Platelets: 283 10*3/uL (ref 150–400)
RBC: 3.99 MIL/uL — ABNORMAL LOW (ref 4.22–5.81)
RDW: 12.1 % (ref 11.5–15.5)
WBC: 8.5 10*3/uL (ref 4.0–10.5)

## 2018-05-04 LAB — LIPASE, BLOOD: Lipase: 20 U/L (ref 11–51)

## 2018-05-04 MED ORDER — DIPHENHYDRAMINE HCL 50 MG/ML IJ SOLN
25.0000 mg | Freq: Once | INTRAMUSCULAR | Status: AC
  Administered 2018-05-04: 25 mg via INTRAVENOUS
  Filled 2018-05-04: qty 1

## 2018-05-04 MED ORDER — SODIUM CHLORIDE 0.9 % IV BOLUS
1000.0000 mL | Freq: Once | INTRAVENOUS | Status: AC
  Administered 2018-05-04: 1000 mL via INTRAVENOUS

## 2018-05-04 MED ORDER — HALOPERIDOL LACTATE 5 MG/ML IJ SOLN
2.0000 mg | Freq: Once | INTRAMUSCULAR | Status: AC
  Administered 2018-05-04: 2 mg via INTRAVENOUS
  Filled 2018-05-04: qty 1

## 2018-05-04 MED ORDER — HALOPERIDOL LACTATE 5 MG/ML IJ SOLN
2.5000 mg | Freq: Once | INTRAMUSCULAR | Status: AC
  Administered 2018-05-04: 2.5 mg via INTRAVENOUS
  Filled 2018-05-04: qty 1

## 2018-05-04 NOTE — ED Provider Notes (Signed)
[facility]-EMERGENCY DEPT Provider Note   CSN: [id number] Arrival date & time: 05/04/18  1038     History   Chief Complaint Chief Complaint  Patient presents with  . Abdominal Pain    [name] is a 37 y.o. male.  HPI 37 year old male with history of chronic abdominal pain, biliary dyskinesia, history of cannabis hyperemesis here with ongoing nausea and vomiting.  The patient admits to smoking marijuana last night.  He states he felt fine but when he started making breakfast this morning, says he smell the fumes and began vomiting.  He developed aching, gnawing, severe, cramping epigastric abdominal pain.  Is been unable to eat or drink since then.  Symptoms did improve in a warm shower.  Symptoms feel similar to his chronic pain.  Denies any alleviating factors.  Is not recently filled any of his medications are given to him at recent visit yesterday.  No fevers or chills.  No other complaints.  No urinary symptoms.  Past Medical History:  Diagnosis Date  . 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 Chronic abdominal pain   . Cyclical vomiting syndrome   . GASTRITIS 12/09/2009   Qualifier: Diagnosis of  By: Daphine DeutscherMartin FNP, Zena AmosNykedtra    . Gastroparesis   . HELICOBACTER PYLORI INFECTION 12/14/2009   Qualifier: Diagnosis of  By: Levon Hedgerraddock, Brenda    . Nausea and vomiting    chronic, recurrent  . Polysubstance abuse [facility](HCC)     Patient Active Problem List   Diagnosis Date Noted  . Uncontrollable vomiting 03/10/2016  . Anemia 03/10/2016  . Metatarsal fracture 12/22/2015  . Essential hypertension 12/22/2015  . Night sweats 01/21/2015  . GERD (gastroesophageal reflux disease) 01/21/2015  . Gastritis 12/19/2014  . Hyperbilirubinemia 12/19/2014  . Elevated bilirubin   . Nausea & vomiting 12/14/2014  . Severe tetrahydrocannabinol (THC) dependence (HCC) 12/14/2014  . Alcohol abuse 12/14/2014  . Cyclic vomiting  syndrome 12/06/2014  . 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 Gastroparesis 05/11/2012  . Abdominal pain 05/06/2012  . Polysubstance abuse (HCC) 05/06/2012  . CHOLECYSTECTOMY, HX OF 11/25/2009    Past Surgical History:  Procedure Laterality Date  . CHOLECYSTECTOMY  11/2009        Home Medications    Prior to Admission medications   Medication Sig Start Date End Date Taking? Authorizing Provider  capsaicin (ZOSTRIX) 0.025 % cream Apply topically 2 (two) times daily. Apply to your abdomen 2-3 times a day if you are having problems with nausea, vomiting and pain.  Also take a dose of Reglan and Benadryl as prescribed. Patient not taking: Reported on 05/02/2018 02/21/18   Arby BarrettePfeiffer, Marcy, MD  Capsaicin-Menthol-Methyl Sal (CAPSAICIN-METHYL SAL-MENTHOL) 0.025-1-12 % CREA Apply 1 inch topically 2 (two) times daily. Patient not taking: Reported on 05/02/2018 02/19/18   Kellie ShropshireShrosbree, Emily J, PA-C  dicyclomine (BENTYL) 20 MG tablet Take 1 tablet (20 mg total) by mouth 2 (two) times daily. Patient not taking: Reported on 05/02/2018 11/04/17   Aviva KluverMurray, Alyssa B, PA-C  diphenhydrAMINE (BENADRYL) 25 MG tablet Take 1 tablet (25 mg total) by mouth every 6 (six) hours as needed. Take with Reglan to try to control nausea and vomiting. Patient not taking: Reported on 05/02/2018 02/21/18   Arby BarrettePfeiffer, Marcy, MD  lidocaine (XYLOCAINE) 2 % solution Use as directed 15 mLs every 3 (three) hours as needed in the mouth or throat for mouth pain. Patient not taking:  Reported on 05/02/2018 07/01/17   McDonald, Pedro Earls A, PA-C  lisinopril (PRINIVIL,ZESTRIL) 5 MG tablet Take 1 tablet (5 mg total) by mouth daily. Patient not taking: Reported on 05/02/2018 08/10/16   Hoy Register, MD  metoCLOPramide (REGLAN) 10 MG tablet Take 1 tablet (10 mg total) by mouth 3 (three) times daily before meals. Patient not taking: Reported on 05/02/2018 03/13/18   Vivianne Master, PA-C  omeprazole (PRILOSEC) 20 MG  capsule Take 1 capsule (20 mg total) by mouth daily. Patient not taking: Reported on 05/02/2018 02/21/18   Arby Barrette, MD  ondansetron (ZOFRAN ODT) 4 MG disintegrating tablet Take 1 tablet (4 mg total) by mouth every 8 (eight) hours as needed for nausea or vomiting. 04/30/18   Little, Ambrose Finland, MD  pantoprazole (PROTONIX) 40 MG tablet Take 1 tablet (40 mg total) by mouth daily. Patient not taking: Reported on 05/02/2018 03/13/18   Vivianne Master, PA-C  potassium chloride SA (K-DUR,KLOR-CON) 20 MEQ tablet Take 1 tablet (20 mEq total) by mouth daily for 3 doses. Patient not taking: Reported on 05/02/2018 11/04/17 05/02/18  Elisha Ponder, PA-C    Family History Family History  Problem Relation Age of Onset  . Other Father   . Diabetes Father   . Hypertension Father     Social History Social History   Tobacco Use  . Smoking status: Current Some Day Smoker    Years: 8.00    Types: Cigars    Last attempt to quit: 12/15/2014    Years since quitting: 3.3  . Smokeless tobacco: Never Used  Substance Use Topics  . Alcohol use: No    Alcohol/week: 0.0 standard drinks  . Drug use: Yes    Types: Marijuana    Comment: Last used: 4 days ago per patient on 04/30/2018     Allergies   Patient has no known allergies.   Review of Systems Review of Systems  Constitutional: Negative for chills, fatigue and fever.  HENT: Negative for congestion and rhinorrhea.   Eyes: Negative for visual disturbance.  Respiratory: Negative for cough, shortness of breath and wheezing.   Cardiovascular: Negative for chest pain and leg swelling.  Gastrointestinal: Positive for abdominal pain, nausea and vomiting. Negative for diarrhea.  Genitourinary: Negative for dysuria and flank pain.  Musculoskeletal: Negative for neck pain and neck stiffness.  Skin: Negative for rash and wound.  Allergic/Immunologic: Negative for immunocompromised state.  Neurological: Negative for syncope, weakness and headaches.    All other systems reviewed and are negative.    Physical Exam Updated Vital Signs BP (!) 147/64   Pulse 63   Temp 98 F (36.7 C) (Oral)   Resp 17   Ht 5\' 9"  (1.753 m)   Wt 83.5 kg   SpO2 97%   BMI 27.17 kg/m   Physical Exam  Constitutional: He is oriented to person, place, and time. He appears well-developed and well-nourished. He appears distressed.  HENT:  Head: Normocephalic and atraumatic.  Eyes: Conjunctivae are normal.  Neck: Neck supple.  Cardiovascular: Normal rate, regular rhythm and normal heart sounds. Exam reveals no friction rub.  No murmur heard. Pulmonary/Chest: Effort normal and breath sounds normal. No respiratory distress. He has no wheezes. He has no rales.  Abdominal: He exhibits no distension. There is generalized tenderness. There is no rigidity, no rebound and no guarding.  Musculoskeletal: He exhibits no edema.  Neurological: He is alert and oriented to person, place, and time. He exhibits normal muscle tone.  Skin: Skin is  warm. Capillary refill takes less than 2 seconds.  Psychiatric: He has a normal mood and affect.  Nursing note and vitals reviewed.    ED Treatments / Results  Labs (all labs ordered are listed, but only abnormal results are displayed) Labs Reviewed  COMPREHENSIVE METABOLIC PANEL - Abnormal; Notable for the following components:      Result Value   Glucose, Bld 105 (*)    Total Bilirubin 2.5 (*)    All other components within normal limits  CBC - Abnormal; Notable for the following components:   RBC 3.99 (*)    Hemoglobin 12.9 (*)    HCT 37.7 (*)    All other components within normal limits  LIPASE, BLOOD  URINALYSIS, ROUTINE W REFLEX MICROSCOPIC    EKG None  Radiology No results found.  Procedures Procedures (including critical care time)  Medications Ordered in ED Medications  haloperidol lactate (HALDOL) injection 2.5 mg (2.5 mg Intravenous Given 05/04/18 1244)  diphenhydrAMINE (BENADRYL) injection 25 mg  (25 mg Intravenous Given 05/04/18 1244)  sodium chloride 0.9 % bolus 1,000 mL (1,000 mLs Intravenous New Bag/Given 05/04/18 1243)  haloperidol lactate (HALDOL) injection 2 mg (2 mg Intravenous Given 05/04/18 1425)     Initial Impression / Assessment and Plan / ED Course  I have reviewed the triage vital signs and the nursing notes.  Pertinent labs & imaging results that were available during my care of the patient were reviewed by me and considered in my medical decision making (see chart for details).     37 year old male here with recurrent, severe abdominal pain which I suspect is due to cannabis hyperemesis syndrome versus cyclical vomiting.  Lab work today is very reassuring and at his baseline.  I do note a chronic elevation in his bilirubin which is likely due to his vomiting and this is not above his baseline.  He has no right upper quadrant tenderness.  Following Haldol, patient had market symptomatic relief and is sleeping comfortably and tolerating p.o.  He has had no further vomiting. Will d/c with instructions to stop marijuana, fill his meds.   Final Clinical Impressions(s) / ED Diagnoses   Final diagnoses:  Cannabinoid hyperemesis syndrome [facility])    ED Discharge Orders    None       Shaune Pollack, MD 05/04/18 1521

## 2018-05-04 NOTE — ED Triage Notes (Signed)
Per EMS-states he suddenly got right lower abdominal pain when he turned on the stove this am-vomited twice-diaphoetic

## 2018-05-04 NOTE — ED Notes (Signed)
Pt given warm blanket and instructed to provide a urine sample when able.

## 2018-05-07 ENCOUNTER — Emergency Department (HOSPITAL_COMMUNITY)
Admission: EM | Admit: 2018-05-07 | Discharge: 2018-05-07 | Disposition: A | Discharge status: Home / Self Care | Payer: Self-pay | Attending: Emergency Medicine | Admitting: Emergency Medicine

## 2018-05-07 ENCOUNTER — Encounter (HOSPITAL_COMMUNITY): Payer: Self-pay | Admitting: Emergency Medicine

## 2018-05-07 DIAGNOSIS — R1084 Generalized abdominal pain: Secondary | ICD-10-CM | POA: Insufficient documentation

## 2018-05-07 DIAGNOSIS — I10 Essential (primary) hypertension: Secondary | ICD-10-CM | POA: Insufficient documentation

## 2018-05-07 DIAGNOSIS — F17290 Nicotine dependence, other tobacco product, uncomplicated: Secondary | ICD-10-CM | POA: Insufficient documentation

## 2018-05-07 LAB — COMPREHENSIVE METABOLIC PANEL
ALT: 22 U/L (ref 0–44)
AST: 23 U/L (ref 15–41)
Albumin: 4.2 g/dL (ref 3.5–5.0)
Alkaline Phosphatase: 52 U/L (ref 38–126)
Anion gap: 8 (ref 5–15)
BUN: 10 mg/dL (ref 6–20)
CO2: 29 mmol/L (ref 22–32)
Calcium: 9.3 mg/dL (ref 8.9–10.3)
Chloride: 107 mmol/L (ref 98–111)
Creatinine, Ser: 0.92 mg/dL (ref 0.61–1.24)
GFR calc Af Amer: >60 mL/min (ref >60)
GFR calc non Af Amer: >60 mL/min (ref >60)
Glucose, Bld: 106 mg/dL — ABNORMAL HIGH (ref 70–99)
Potassium: 3.5 mmol/L (ref 3.5–5.1)
Sodium: 144 mmol/L (ref 135–145)
Total Bilirubin: 1.5 mg/dL — ABNORMAL HIGH (ref 0.3–1.2)
Total Protein: 7.1 g/dL (ref 6.5–8.1)

## 2018-05-07 LAB — URINALYSIS, ROUTINE W REFLEX MICROSCOPIC
Bilirubin Urine: NEGATIVE
Glucose, UA: NEGATIVE mg/dL
Hgb urine dipstick: NEGATIVE
Ketones, ur: NEGATIVE mg/dL
Leukocytes, UA: NEGATIVE
Nitrite: NEGATIVE
Protein, ur: NEGATIVE mg/dL
Specific Gravity, Urine: 1.026 (ref 1.005–1.030)
pH: 5 (ref 5.0–8.0)

## 2018-05-07 LAB — LIPASE, BLOOD: Lipase: 27 U/L (ref 11–51)

## 2018-05-07 LAB — CBC
HCT: 35.4 % — ABNORMAL LOW (ref 39.0–52.0)
Hemoglobin: 11.8 g/dL — ABNORMAL LOW (ref 13.0–17.0)
MCH: 31.7 pg (ref 26.0–34.0)
MCHC: 33.3 g/dL (ref 30.0–36.0)
MCV: 95.2 fL (ref 78.0–100.0)
Platelets: 268 10*3/uL (ref 150–400)
RBC: 3.72 MIL/uL — ABNORMAL LOW (ref 4.22–5.81)
RDW: 11.9 % (ref 11.5–15.5)
WBC: 7.2 10*3/uL (ref 4.0–10.5)

## 2018-05-07 NOTE — ED Notes (Signed)
Pt given apple juice  

## 2018-05-07 NOTE — Discharge Instructions (Signed)
Return to ER for new or worsening symptoms, any additional concerns.  °

## 2018-05-07 NOTE — ED Triage Notes (Signed)
Pt [facility] from home for abd pains for couple weeks. Was prescribed opioids but hasnt had money to get filled. Pt has IV 18g left AC Fentanyl given in route.  Vitals: 200/100, HR 68, 18R, 97% on RA.

## 2018-05-07 NOTE — ED Provider Notes (Signed)
[facility]-EMERGENCY DEPT Provider Note   CSN: [id number] Arrival date & time: 05/07/18  1041     History   Chief Complaint Chief Complaint  Patient presents with  . Abdominal Pain    [name] is a 37 y.o. male.  The history is provided by the patient and medical records. No language interpreter was used.  Abdominal Pain   Associated symptoms include nausea. Pertinent negatives include fever, diarrhea, vomiting and constipation.   Matthew Scott is a 37 y.o. male  with a PMH of chronic abdominal pain, biliary dyskinesia, gastroparesis, polysubstance abuse who presents to the Emergency Department complaining of generalized abdominal pain.  This has been intermittent over the last several weeks.  Associated with nausea and 3-4 episodes of emesis today.  He has history of similar and reports no change from his usual exacerbations.  He was given 200 mcg fentanyl in route by EMS and states that his pain is now resolved.  He denies any current abdominal pain, nausea.  No emesis since arrival to the ED.  His only complaint is that he is thirsty.  He does endorse marijuana use last night.  No fever, chills, back pain, diarrhea, constipation or blood in the stool.  Past Medical History:  Diagnosis Date  . 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 Chronic abdominal pain   . Cyclical vomiting syndrome   . GASTRITIS 12/09/2009   Qualifier: Diagnosis of  By: Daphine Deutscher FNP, Zena Amos    . Gastroparesis   . 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 Nausea and vomiting    chronic, recurrent  . Polysubstance abuse [facility])     Patient Active Problem List   Diagnosis Date Noted  . Uncontrollable vomiting 03/10/2016  . Anemia 03/10/2016  . Metatarsal fracture 12/22/2015  . Essential hypertension 12/22/2015  . Night sweats 01/21/2015  . GERD (gastroesophageal reflux  disease) 01/21/2015  . Gastritis 12/19/2014  . Hyperbilirubinemia 12/19/2014  . Elevated bilirubin   . Nausea & vomiting 12/14/2014  . Severe tetrahydrocannabinol (THC) dependence (HCC) 12/14/2014  . Alcohol abuse 12/14/2014  . Cyclic vomiting syndrome 12/06/2014  . 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 Gastroparesis 05/11/2012  . Abdominal pain 05/06/2012  . Polysubstance abuse (HCC) 05/06/2012  . CHOLECYSTECTOMY, HX OF 11/25/2009    Past Surgical History:  Procedure Laterality Date  . CHOLECYSTECTOMY  11/2009        Home Medications    Prior to Admission medications   Medication Sig Start Date End Date Taking? Authorizing Provider  calcium carbonate (TUMS - DOSED IN MG ELEMENTAL CALCIUM) 500 MG chewable tablet Chew 2 tablets by mouth daily as needed for indigestion or heartburn.   Yes [provider]  Naproxen Sodium (ALEVE) 220 MG CAPS Take 440 mg by mouth daily as needed.   Yes [provider]  capsaicin (ZOSTRIX) 0.025 % cream Apply topically 2 (two) times daily. Apply to your abdomen 2-3 times a day if you are having problems with nausea, vomiting and pain.  Also take a dose of Reglan and Benadryl as prescribed. Patient not taking: Reported on 05/07/2018 02/21/18   Arby Barrette, MD  Capsaicin-Menthol-Methyl Sal (CAPSAICIN-METHYL SAL-MENTHOL) 0.025-1-12 % CREA Apply 1 inch topically 2 (two) times daily. Patient not taking: Reported on 05/07/2018 02/19/18   Kellie Shropshire, PA-C  dicyclomine (BENTYL) 20 MG tablet  Take 1 tablet (20 mg total) by mouth 2 (two) times daily. Patient not taking: Reported on 05/07/2018 11/04/17   Elisha Ponder, PA-C  diphenhydrAMINE (BENADRYL) 25 MG tablet Take 1 tablet (25 mg total) by mouth every 6 (six) hours as needed. Take with Reglan to try to control nausea and vomiting. Patient not taking: Reported on 05/07/2018 02/21/18   Arby Barrette, MD  lidocaine (XYLOCAINE) 2 % solution Use as  directed 15 mLs every 3 (three) hours as needed in the mouth or throat for mouth pain. Patient not taking: Reported on 05/07/2018 07/01/17   McDonald, Pedro Earls A, PA-C  lisinopril (PRINIVIL,ZESTRIL) 5 MG tablet Take 1 tablet (5 mg total) by mouth daily. Patient not taking: Reported on 05/07/2018 08/10/16   Hoy Register, MD  metoCLOPramide (REGLAN) 10 MG tablet Take 1 tablet (10 mg total) by mouth 3 (three) times daily before meals. Patient not taking: Reported on 05/07/2018 03/13/18   Vivianne Master, PA-C  omeprazole (PRILOSEC) 20 MG capsule Take 1 capsule (20 mg total) by mouth daily. Patient not taking: Reported on 05/07/2018 02/21/18   Arby Barrette, MD  ondansetron (ZOFRAN ODT) 4 MG disintegrating tablet Take 1 tablet (4 mg total) by mouth every 8 (eight) hours as needed for nausea or vomiting. Patient not taking: Reported on 05/07/2018 04/30/18   Little, Ambrose Finland, MD  pantoprazole (PROTONIX) 40 MG tablet Take 1 tablet (40 mg total) by mouth daily. Patient not taking: Reported on 05/07/2018 03/13/18   Vivianne Master, PA-C  potassium chloride SA (K-DUR,KLOR-CON) 20 MEQ tablet Take 1 tablet (20 mEq total) by mouth daily for 3 doses. Patient not taking: Reported on 05/02/2018 11/04/17 05/02/18  Elisha Ponder, PA-C    Family History Family History  Problem Relation Age of Onset  . Other Father   . Diabetes Father   . Hypertension Father     Social History Social History   Tobacco Use  . Smoking status: Current Some Day Smoker    Years: 8.00    Types: Cigars    Last attempt to quit: 12/15/2014    Years since quitting: 3.3  . Smokeless tobacco: Never Used  Substance Use Topics  . Alcohol use: No    Alcohol/week: 0.0 standard drinks  . Drug use: Yes    Types: Marijuana    Comment: Last used: 4 days ago per patient on 04/30/2018     Allergies   Patient has no known allergies.   Review of Systems Review of Systems  Constitutional: Negative for chills and fever.    Gastrointestinal: Positive for abdominal pain and nausea. Negative for blood in stool, constipation, diarrhea and vomiting.  All other systems reviewed and are negative.    Physical Exam Updated Vital Signs BP (!) 116/97   Pulse 67   Temp 97.9 F (36.6 C) (Oral)   Resp 16   Ht 5\' 9"  (1.753 m)   Wt 85.7 kg   SpO2 100%   BMI 27.91 kg/m   Physical Exam  Constitutional: He is oriented to person, place, and time. He appears well-developed and well-nourished. No distress.  Nontoxic-appearing.  HENT:  Head: Normocephalic and atraumatic.  Cardiovascular: Normal rate, regular rhythm and normal heart sounds.  No murmur heard. Pulmonary/Chest: Effort normal and breath sounds normal. No respiratory distress.  Abdominal: Soft. He exhibits no distension.  No abdominal tenderness.  Musculoskeletal: Normal range of motion.  Neurological: He is alert and oriented to person, place, and time.  Skin: Skin is warm  and dry.  Nursing note and vitals reviewed.    ED Treatments / Results  Labs (all labs ordered are listed, but only abnormal results are displayed) Labs Reviewed  COMPREHENSIVE METABOLIC PANEL - Abnormal; Notable for the following components:      Result Value   Glucose, Bld 106 (*)    Total Bilirubin 1.5 (*)    All other components within normal limits  CBC - Abnormal; Notable for the following components:   RBC 3.72 (*)    Hemoglobin 11.8 (*)    HCT 35.4 (*)    All other components within normal limits  LIPASE, BLOOD  URINALYSIS, ROUTINE W REFLEX MICROSCOPIC    EKG None  Radiology No results found.  Procedures Procedures (including critical care time)  Medications Ordered in ED Medications - No data to display   Initial Impression / Assessment and Plan / ED Course  I have reviewed the triage vital signs and the nursing notes.  Pertinent labs & imaging results that were available during my care of the patient were reviewed by me and considered in my  medical decision making (see chart for details).     Matthew Scott is a 37 y.o. 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  He was given 200 mcg of fentanyl in route and now reports that his pain has resolved.  On exam, patient is afebrile, hemodynamically stable with no abdominal tenderness.  Labs were reviewed and reassuring.  Patient was observed in the emergency department for 3 hours with no return of his abdominal pain P.  Repeat abdominal exam again benign.  He is eating and drinking without any emesis since arrival. Evaluation does not show pathology that would require ongoing emergent intervention or inpatient treatment. PCP follow up encouraged. Return precautions discussed. All questions answered.    Final Clinical Impressions(s) / ED Diagnoses   Final diagnoses:  Generalized abdominal pain    ED Discharge Orders    None       Alyza Artiaga, Chase PicketJaime Pilcher, PA-C 05/07/18 1403    Derwood KaplanNanavati, Ankit, MD 05/09/18 [phone]

## 2018-08-01 ENCOUNTER — Emergency Department (HOSPITAL_COMMUNITY): Payer: Self-pay

## 2018-08-01 ENCOUNTER — Other Ambulatory Visit: Payer: Self-pay

## 2018-08-01 ENCOUNTER — Encounter (HOSPITAL_COMMUNITY): Payer: Self-pay

## 2018-08-01 ENCOUNTER — Emergency Department (HOSPITAL_COMMUNITY)
Admission: EM | Admit: 2018-08-01 | Discharge: 2018-08-01 | Disposition: A | Discharge status: Home / Self Care | Payer: Self-pay | Attending: Emergency Medicine | Admitting: Emergency Medicine

## 2018-08-01 DIAGNOSIS — K529 Noninfective gastroenteritis and colitis, unspecified: Secondary | ICD-10-CM | POA: Insufficient documentation

## 2018-08-01 DIAGNOSIS — F17290 Nicotine dependence, other tobacco product, uncomplicated: Secondary | ICD-10-CM | POA: Insufficient documentation

## 2018-08-01 DIAGNOSIS — I10 Essential (primary) hypertension: Secondary | ICD-10-CM | POA: Insufficient documentation

## 2018-08-01 LAB — URINALYSIS, ROUTINE W REFLEX MICROSCOPIC
Bilirubin Urine: NEGATIVE
Glucose, UA: NEGATIVE mg/dL
Hgb urine dipstick: NEGATIVE
Ketones, ur: 5 mg/dL — AB
Leukocytes, UA: NEGATIVE
Nitrite: NEGATIVE
Protein, ur: NEGATIVE mg/dL
Specific Gravity, Urine: 1.027 (ref 1.005–1.030)
pH: 5 (ref 5.0–8.0)

## 2018-08-01 LAB — CBC
HCT: 39.3 % (ref 39.0–52.0)
Hemoglobin: 12.5 g/dL — ABNORMAL LOW (ref 13.0–17.0)
MCH: 31.5 pg (ref 26.0–34.0)
MCHC: 31.8 g/dL (ref 30.0–36.0)
MCV: 99 fL (ref 80.0–100.0)
Platelets: 233 10*3/uL (ref 150–400)
RBC: 3.97 MIL/uL — ABNORMAL LOW (ref 4.22–5.81)
RDW: 11.9 % (ref 11.5–15.5)
WBC: 7.9 10*3/uL (ref 4.0–10.5)
nRBC: 0 % (ref 0.0–0.2)

## 2018-08-01 LAB — COMPREHENSIVE METABOLIC PANEL
ALT: 15 U/L (ref 0–44)
AST: 20 U/L (ref 15–41)
Albumin: 4.3 g/dL (ref 3.5–5.0)
Alkaline Phosphatase: 54 U/L (ref 38–126)
Anion gap: 6 (ref 5–15)
BUN: 11 mg/dL (ref 6–20)
CO2: 28 mmol/L (ref 22–32)
Calcium: 9.4 mg/dL (ref 8.9–10.3)
Chloride: 108 mmol/L (ref 98–111)
Creatinine, Ser: 0.99 mg/dL (ref 0.61–1.24)
GFR calc Af Amer: >60 mL/min (ref >60)
GFR calc non Af Amer: >60 mL/min (ref >60)
Glucose, Bld: 94 mg/dL (ref 70–99)
Potassium: 3.5 mmol/L (ref 3.5–5.1)
Sodium: 142 mmol/L (ref 135–145)
Total Bilirubin: 1.7 mg/dL — ABNORMAL HIGH (ref 0.3–1.2)
Total Protein: 7.1 g/dL (ref 6.5–8.1)

## 2018-08-01 LAB — LIPASE, BLOOD: Lipase: 22 U/L (ref 11–51)

## 2018-08-01 MED ORDER — ONDANSETRON 4 MG PO TBDP: 4.0000 mg | ORAL_TABLET | Freq: Three times a day (TID) | ORAL | 0 refills | Status: DC | PRN

## 2018-08-01 MED ORDER — IOPAMIDOL (ISOVUE-300) INJECTION 61%
INTRAVENOUS | Status: AC
  Filled 2018-08-01: qty 100

## 2018-08-01 MED ORDER — SODIUM CHLORIDE (PF) 0.9 % IJ SOLN
INTRAMUSCULAR | Status: AC
  Filled 2018-08-01: qty 50

## 2018-08-01 MED ORDER — MORPHINE SULFATE (PF) 2 MG/ML IV SOLN
2.0000 mg | Freq: Once | INTRAVENOUS | Status: AC
  Administered 2018-08-01: 2 mg via INTRAVENOUS
  Filled 2018-08-01: qty 1

## 2018-08-01 MED ORDER — IOPAMIDOL (ISOVUE-300) INJECTION 61%
100.0000 mL | Freq: Once | INTRAVENOUS | Status: AC | PRN
  Administered 2018-08-01: 100 mL via INTRAVENOUS

## 2018-08-01 MED ORDER — SODIUM CHLORIDE 0.9 % IV BOLUS
1000.0000 mL | Freq: Once | INTRAVENOUS | Status: AC
  Administered 2018-08-01: 1000 mL via INTRAVENOUS

## 2018-08-01 NOTE — ED Provider Notes (Signed)
[facility]-EMERGENCY DEPT Provider Note   CSN: [id number] Arrival date & time: 08/01/18  1448     History   Chief Complaint Chief Complaint  Patient presents with  . Abdominal Pain    [name] is a 37 y.o. male with history of cholecystectomy, gastritis, gastroparesis, polysubstance abuse, chronic abdominal pain presented today for 2-day history of abdominal pain and vomiting.  Patient endorses gradual onset of symptoms starting yesterday morning, sharp right upper quadrant pain that is constant however worsens spontaneously in waves and with palpation and improved with laying still.  Endorses subjective fevers.  Patient also states he has vomited twice, nonbloody/nonbilious one time yesterday and once just prior to arrival.  HPI  Past Medical History:  Diagnosis Date  . 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 Chronic abdominal pain   . Cyclical vomiting syndrome   . GASTRITIS 12/09/2009   Qualifier: Diagnosis of  By: Daphine Deutscher FNP, Zena Amos    . Gastroparesis   . 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 Nausea and vomiting    chronic, recurrent  . Polysubstance abuse [facility])     Patient Active Problem List   Diagnosis Date Noted  . Uncontrollable vomiting 03/10/2016  . Anemia 03/10/2016  . Metatarsal fracture 12/22/2015  . Essential hypertension 12/22/2015  . Night sweats 01/21/2015  . GERD (gastroesophageal reflux disease) 01/21/2015  . Gastritis 12/19/2014  . Hyperbilirubinemia 12/19/2014  . Elevated bilirubin   . Nausea & vomiting 12/14/2014  . Severe tetrahydrocannabinol (THC) dependence (HCC) 12/14/2014  . Alcohol abuse 12/14/2014  . Cyclic vomiting syndrome 12/06/2014  . 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 Gastroparesis 05/11/2012  . Abdominal pain 05/06/2012  . Polysubstance abuse (HCC)  05/06/2012  . CHOLECYSTECTOMY, HX OF 11/25/2009    Past Surgical History:  Procedure Laterality Date  . CHOLECYSTECTOMY  11/2009      Home Medications    Prior to Admission medications   Medication Sig Start Date End Date Taking? Authorizing Provider  calcium carbonate (TUMS - DOSED IN MG ELEMENTAL CALCIUM) 500 MG chewable tablet Chew 2 tablets by mouth daily as needed for indigestion or heartburn.   Yes [provider]  Naproxen Sodium (ALEVE) 220 MG CAPS Take 440 mg by mouth daily as needed (pain).    Yes [provider]  capsaicin (ZOSTRIX) 0.025 % cream Apply topically 2 (two) times daily. Apply to your abdomen 2-3 times a day if you are having problems with nausea, vomiting and pain.  Also take a dose of Reglan and Benadryl as prescribed. Patient not taking: Reported on 08/01/2018 02/21/18   Arby Barrette, MD  Capsaicin-Menthol-Methyl Sal (CAPSAICIN-METHYL SAL-MENTHOL) 0.025-1-12 % CREA Apply 1 inch topically 2 (two) times daily. Patient not taking: Reported on 08/01/2018 02/19/18   Kellie Shropshire, PA-C  dicyclomine (BENTYL) 20 MG tablet Take 1 tablet (20 mg total) by mouth 2 (two) times daily. Patient not taking: Reported on 08/01/2018 11/04/17   Aviva Kluver B, PA-C  diphenhydrAMINE (BENADRYL) 25 MG tablet Take 1 tablet (25 mg total) by mouth every 6 (six) hours as needed. Take with Reglan to try to control nausea and vomiting. Patient not taking: Reported on 08/01/2018 02/21/18   Arby Barrette, MD  lidocaine (XYLOCAINE) 2 % solution Use as directed 15 mLs every 3 (three) hours as needed in the mouth or throat for  mouth pain. Patient not taking: Reported on 08/01/2018 07/01/17   McDonald, Mia A, PA-C  lisinopril (PRINIVIL,ZESTRIL) 5 MG tablet Take 1 tablet (5 mg total) by mouth daily. Patient not taking: Reported on 08/01/2018 08/10/16   Hoy Register, MD  metoCLOPramide (REGLAN) 10 MG tablet Take 1 tablet (10 mg total) by mouth 3 (three) times daily before  meals. Patient not taking: Reported on 08/01/2018 03/13/18   Vivianne Master, PA-C  omeprazole (PRILOSEC) 20 MG capsule Take 1 capsule (20 mg total) by mouth daily. Patient not taking: Reported on 08/01/2018 02/21/18   Arby Barrette, MD  ondansetron (ZOFRAN ODT) 4 MG disintegrating tablet Take 1 tablet (4 mg total) by mouth every 8 (eight) hours as needed for nausea or vomiting. 08/01/18   Harlene Salts A, PA-C  pantoprazole (PROTONIX) 40 MG tablet Take 1 tablet (40 mg total) by mouth daily. Patient not taking: Reported on 08/01/2018 03/13/18   Vivianne Master, PA-C  potassium chloride SA (K-DUR,KLOR-CON) 20 MEQ tablet Take 1 tablet (20 mEq total) by mouth daily for 3 doses. Patient not taking: Reported on 05/02/2018 11/04/17 05/02/18  Elisha Ponder, PA-C    Family History Family History  Problem Relation Age of Onset  . Other Father   . Diabetes Father   . Hypertension Father     Social History Social History   Tobacco Use  . Smoking status: Current Some Day Smoker    Years: 8.00    Types: Cigars    Last attempt to quit: 12/15/2014    Years since quitting: 3.6  . Smokeless tobacco: Never Used  Substance Use Topics  . Alcohol use: No    Alcohol/week: 0.0 standard drinks  . Drug use: Yes    Types: Marijuana    Comment: Last used: 4 days ago per patient on 04/30/2018     Allergies   Patient has no known allergies.   Review of Systems Review of Systems  Constitutional: Positive for fever. Negative for chills.  HENT: Negative.  Negative for rhinorrhea and sore throat.   Eyes: Negative.  Negative for visual disturbance.  Respiratory: Negative.  Negative for cough and shortness of breath.   Cardiovascular: Negative.  Negative for chest pain.  Gastrointestinal: Positive for abdominal pain, nausea and vomiting. Negative for blood in stool and diarrhea.  Genitourinary: Negative.  Negative for dysuria, hematuria, scrotal swelling and testicular pain.  Musculoskeletal:  Negative.  Negative for arthralgias and myalgias.  Skin: Negative.  Negative for rash.  Neurological: Negative.  Negative for dizziness, weakness and headaches.   Physical Exam Updated Vital Signs BP (!) 138/93   Pulse 82   Temp 98.2 F (36.8 C) (Oral)   Resp (!) 22   Ht 5\' 9"  (1.753 m)   Wt 86.2 kg   SpO2 96%   BMI 28.06 kg/m   Physical Exam Constitutional:      General: He is not in acute distress.    Appearance: He is well-developed.  HENT:     Head: Normocephalic and atraumatic.     Right Ear: External ear normal.     Left Ear: External ear normal.     Nose: Nose normal.  Eyes:     Pupils: Pupils are equal, round, and reactive to light.  Neck:     Musculoskeletal: Full passive range of motion without pain, normal range of motion and neck supple.     Trachea: Trachea and phonation normal. No tracheal deviation.  Cardiovascular:     Rate and  Rhythm: Normal rate and regular rhythm.     Pulses:          Dorsalis pedis pulses are 2+ on the right side and 2+ on the left side.       Posterior tibial pulses are 2+ on the right side and 2+ on the left side.     Heart sounds: Normal heart sounds.  Pulmonary:     Effort: Pulmonary effort is normal. No respiratory distress.     Breath sounds: Normal breath sounds and air entry. No rhonchi.  Chest:     Chest wall: No deformity, tenderness or crepitus.  Abdominal:     General: Abdomen is flat. Bowel sounds are normal.     Palpations: Abdomen is soft.     Tenderness: There is abdominal tenderness in the right upper quadrant. There is guarding. There is no rebound.    Musculoskeletal: Normal range of motion.     Right lower leg: Normal.     Left lower leg: Normal.  Feet:     Right foot:     Protective Sensation: 3 sites tested. 3 sites sensed.     Left foot:     Protective Sensation: 3 sites tested. 3 sites sensed.  Skin:    General: Skin is warm and dry.     Capillary Refill: Capillary refill takes less than 2 seconds.    Neurological:     General: No focal deficit present.     Mental Status: He is alert.     GCS: GCS eye subscore is 4. GCS verbal subscore is 5. GCS motor subscore is 6.     Comments: Speech is clear and goal oriented, follows commands Major Cranial nerves without deficit, no facial droop Normal strength in upper and lower extremities bilaterally including dorsiflexion and plantar flexion, strong and equal grip strength Sensation normal to light touch Moves extremities without ataxia, coordination intact Normal gait  Psychiatric:        Mood and Affect: Mood normal.        Behavior: Behavior normal.    ED Treatments / Results  Labs (all labs ordered are listed, but only abnormal results are displayed) Labs Reviewed  COMPREHENSIVE METABOLIC PANEL - Abnormal; Notable for the following components:      Result Value   Total Bilirubin 1.7 (*)    All other components within normal limits  CBC - Abnormal; Notable for the following components:   RBC 3.97 (*)    Hemoglobin 12.5 (*)    All other components within normal limits  URINALYSIS, ROUTINE W REFLEX MICROSCOPIC - Abnormal; Notable for the following components:   Ketones, ur 5 (*)    All other components within normal limits  LIPASE, BLOOD    EKG None  Radiology Ct Abdomen Pelvis W Contrast  Result Date: 08/01/2018 CLINICAL DATA:  37 year old with acute abdominal pain and fever. EXAM: CT ABDOMEN AND PELVIS WITH CONTRAST TECHNIQUE: Multidetector CT imaging of the abdomen and pelvis was performed using the standard protocol following bolus administration of intravenous contrast. CONTRAST:  ISOVUE-300 IOPAMIDOL (ISOVUE-300) INJECTION 61% COMPARISON:  11/04/2017 FINDINGS: Lower chest: Lung bases are clear. Hepatobiliary: Punctate low-density structure near the hepatic dome is unchanged and could represent a small cyst. Gallbladder has been removed. Portal venous system is patent. No biliary dilatation. Pancreas: Unremarkable. No  pancreatic ductal dilatation or surrounding inflammatory changes. Spleen: Normal in size without focal abnormality. Adrenals/Urinary Tract: Normal adrenal glands. Normal appearance of the urinary bladder. Punctate low-density in  the right kidney probably represents a cyst and similar to the previous examination. Nonobstructive 5 mm stone in the right kidney upper pole. No evidence for left renal calculi. Stomach/Bowel: Normal appearance of the stomach. No evidence for bowel dilatation. There may be a slightly thickened loop of colon in the left lower quadrant possibly representing the proximal sigmoid colon. There are no significant inflammatory changes around this mildly thickened loop of bowel. Vascular/Lymphatic: No significant vascular findings are present. No enlarged abdominal or pelvic lymph nodes. Reproductive: Prostate is unremarkable. Other: Trace free fluid in the pelvis. Densities along the anterior abdominal wall compatible with prior surgery. Musculoskeletal: Small lucencies in the pelvic bones have not significantly changed since 12/09/2015. No acute bone abnormality. IMPRESSION: 1. Trace free fluid in the pelvis could represent an underlying inflammatory process. Question a mildly thickened loop of bowel in the left lower quadrant which may represent large bowel. Small focus of enteritis or colitis cannot be excluded. 2. Nonobstructive right kidney stone. Electronically Signed   By: Richarda Overlie M.D.   On: 08/01/2018 19:59    Procedures Procedures (including critical care time)  Medications Ordered in ED Medications  iopamidol (ISOVUE-300) 61 % injection (has no administration in time range)  sodium chloride (PF) 0.9 % injection (has no administration in time range)  sodium chloride 0.9 % bolus 1,000 mL (0 mLs Intravenous Stopped 08/01/18 2053)  iopamidol (ISOVUE-300) 61 % injection 100 mL (100 mLs Intravenous Contrast Given 08/01/18 1911)  morphine 2 MG/ML injection 2 mg (2 mg Intravenous  Given 08/01/18 2015)     Initial Impression / Assessment and Plan / ED Course  I have reviewed the triage vital signs and the nursing notes.  Pertinent labs & imaging results that were available during my care of the patient were reviewed by me and considered in my medical decision making (see chart for details).    37 year old male with chronic abdominal pain presenting for 2 days of abdominal pain and 2 episodes of vomiting.  CBC baseline/nonacute CMP baseline/nonacute Lipase within normal limits Urinalysis nonacute CT abdomen pelvis: IMPRESSION: 1. Trace free fluid in the pelvis could represent an underlying inflammatory process. Question a mildly thickened loop of bowel in the left lower quadrant which may represent large bowel. Small focus of enteritis or colitis cannot be excluded. 2. Nonobstructive right kidney stone.  Fluids given pain controlled emergency department.  Patient tolerating p.o. prior to discharge.  Patient has been prescribed ODT Zofran as needed for nausea/vomiting.  Patient is afebrile, non-toxic appearing, sitting comfortably on examination table. Patient's pain and other symptoms adequately managed in emergency department. Patient does not meet the SIRS or Sepsis criteria.  On repeat exam patient does not have a surgical abdomen and there are no peritoneal signs.  Patient informed of CT scan findings and to follow-up with PCP this week.  Encouraged fluid rehydration and rest.  Patient also given referral to [facility] gastroenterology for his chronic abdominal pain.  At this time there does not appear to be any evidence of an acute emergency medical condition and the patient appears stable for discharge with appropriate outpatient follow up. Diagnosis was discussed with patient who verbalizes understanding of care plan and is agreeable to discharge. I have discussed return precautions with patient and family who verbalize understanding of return precautions. Patient  strongly encouraged to follow-up with their PCP this week. All questions answered.  Patient's case discussed with Dr. Donnald Garre who agrees with plan to discharge with follow-up at this time.  Note: Portions of this report may have been transcribed using voice recognition software. Every effort was made to ensure accuracy; however, inadvertent computerized transcription errors may still be present. Final Clinical Impressions(s) / ED Diagnoses   Final diagnoses:  Gastroenteritis    ED Discharge Orders         Ordered    ondansetron (ZOFRAN ODT) 4 MG disintegrating tablet  Every 8 hours PRN     08/01/18 2100           Elizabeth PalauMorelli, Shafter Jupin A, PA-C 08/01/18 2113    Arby BarrettePfeiffer, Marcy, MD 08/04/18 [phone]

## 2018-08-01 NOTE — Discharge Instructions (Signed)
You have been diagnosed today with abdominal pain due to gastroenteritis.  At this time there does not appear to be the presence of an emergent medical condition, however there is always the potential for conditions to change. Please read and follow the below instructions.  Please return to the Emergency Department immediately for any new or worsening symptoms or if your symptoms do not improve within 72 hours. Please be sure to follow up with your Primary Care Provider this week regarding your visit today; please call their office to schedule an appointment even if you are feeling better for a follow-up visit. You may use the medication Zofran as prescribed to help with your nausea and vomiting.  Please drink plenty water and get plenty of rest to help with your symptoms. You have been given referral to gastroenterology at [facility] physician's, for further evaluation of your chronic abdominal pain.  Please read the additional information packets attached to your discharge summary.  Do not take your medicine if  develop an itchy rash, swelling in your mouth or lips, or difficulty breathing.

## 2018-08-01 NOTE — ED Notes (Signed)
Patient requesting pain medicine. PA made aware 

## 2018-08-01 NOTE — ED Triage Notes (Signed)
Pt BIBA from home. Pt has had abd pain x 2 days. Pt states that pain has decreased slightly with EMS. VSS en route.

## 2018-08-02 ENCOUNTER — Emergency Department (HOSPITAL_COMMUNITY)
Admission: EM | Admit: 2018-08-02 | Discharge: 2018-08-02 | Disposition: A | Discharge status: Home / Self Care | Payer: Self-pay | Attending: Emergency Medicine | Admitting: Emergency Medicine

## 2018-08-02 ENCOUNTER — Other Ambulatory Visit: Payer: Self-pay

## 2018-08-02 DIAGNOSIS — F17210 Nicotine dependence, cigarettes, uncomplicated: Secondary | ICD-10-CM | POA: Insufficient documentation

## 2018-08-02 DIAGNOSIS — I10 Essential (primary) hypertension: Secondary | ICD-10-CM | POA: Insufficient documentation

## 2018-08-02 DIAGNOSIS — R112 Nausea with vomiting, unspecified: Secondary | ICD-10-CM

## 2018-08-02 DIAGNOSIS — Z79899 Other long term (current) drug therapy: Secondary | ICD-10-CM | POA: Insufficient documentation

## 2018-08-02 DIAGNOSIS — R1084 Generalized abdominal pain: Secondary | ICD-10-CM | POA: Insufficient documentation

## 2018-08-02 LAB — URINALYSIS, ROUTINE W REFLEX MICROSCOPIC
Bilirubin Urine: NEGATIVE
Glucose, UA: 50 mg/dL — AB
Hgb urine dipstick: NEGATIVE
Ketones, ur: 80 mg/dL — AB
Leukocytes, UA: NEGATIVE
Nitrite: NEGATIVE
Protein, ur: NEGATIVE mg/dL
Specific Gravity, Urine: 1.023 (ref 1.005–1.030)
pH: 6 (ref 5.0–8.0)

## 2018-08-02 LAB — COMPREHENSIVE METABOLIC PANEL
ALT: 19 U/L (ref 0–44)
AST: 35 U/L (ref 15–41)
Albumin: 4.9 g/dL (ref 3.5–5.0)
Alkaline Phosphatase: 60 U/L (ref 38–126)
Anion gap: 14 (ref 5–15)
BUN: 10 mg/dL (ref 6–20)
CO2: 22 mmol/L (ref 22–32)
Calcium: 9.5 mg/dL (ref 8.9–10.3)
Chloride: 106 mmol/L (ref 98–111)
Creatinine, Ser: 1.05 mg/dL (ref 0.61–1.24)
GFR calc Af Amer: >60 mL/min (ref >60)
GFR calc non Af Amer: >60 mL/min (ref >60)
Glucose, Bld: 169 mg/dL — ABNORMAL HIGH (ref 70–99)
Potassium: 3.5 mmol/L (ref 3.5–5.1)
Sodium: 142 mmol/L (ref 135–145)
Total Bilirubin: 2.1 mg/dL — ABNORMAL HIGH (ref 0.3–1.2)
Total Protein: 7.7 g/dL (ref 6.5–8.1)

## 2018-08-02 LAB — LIPASE, BLOOD: Lipase: 20 U/L (ref 11–51)

## 2018-08-02 LAB — RAPID URINE DRUG SCREEN, HOSP PERFORMED
Amphetamines: NOT DETECTED
Barbiturates: NOT DETECTED
Benzodiazepines: NOT DETECTED
Cocaine: NOT DETECTED
Opiates: POSITIVE — AB
Tetrahydrocannabinol: POSITIVE — AB

## 2018-08-02 LAB — CBC WITH DIFFERENTIAL/PLATELET
Abs Immature Granulocytes: 0.1 10*3/uL — ABNORMAL HIGH (ref 0.00–0.07)
Basophils Absolute: 0 10*3/uL (ref 0.0–0.1)
Basophils Relative: 0 %
Eosinophils Absolute: 0 10*3/uL (ref 0.0–0.5)
Eosinophils Relative: 0 %
HCT: 38.5 % — ABNORMAL LOW (ref 39.0–52.0)
Hemoglobin: 12.5 g/dL — ABNORMAL LOW (ref 13.0–17.0)
Immature Granulocytes: 1 %
Lymphocytes Relative: 7 %
Lymphs Abs: 1.2 10*3/uL (ref 0.7–4.0)
MCH: 32.4 pg (ref 26.0–34.0)
MCHC: 32.5 g/dL (ref 30.0–36.0)
MCV: 99.7 fL (ref 80.0–100.0)
Monocytes Absolute: 1.1 10*3/uL — ABNORMAL HIGH (ref 0.1–1.0)
Monocytes Relative: 7 %
Neutro Abs: 14.4 10*3/uL — ABNORMAL HIGH (ref 1.7–7.7)
Neutrophils Relative %: 85 %
Platelets: 238 10*3/uL (ref 150–400)
RBC: 3.86 MIL/uL — ABNORMAL LOW (ref 4.22–5.81)
RDW: 12 % (ref 11.5–15.5)
WBC: 16.9 10*3/uL — ABNORMAL HIGH (ref 4.0–10.5)
nRBC: 0 % (ref 0.0–0.2)

## 2018-08-02 MED ORDER — SODIUM CHLORIDE 0.9 % IV BOLUS
1000.0000 mL | Freq: Once | INTRAVENOUS | Status: AC
  Administered 2018-08-02: 1000 mL via INTRAVENOUS

## 2018-08-02 MED ORDER — METOCLOPRAMIDE HCL 10 MG PO TABS: 10.0000 mg | ORAL_TABLET | Freq: Four times a day (QID) | ORAL | 0 refills | Status: DC

## 2018-08-02 MED ORDER — MORPHINE SULFATE (PF) 4 MG/ML IV SOLN
4.0000 mg | Freq: Once | INTRAVENOUS | Status: AC
  Administered 2018-08-02: 4 mg via INTRAVENOUS
  Filled 2018-08-02: qty 1

## 2018-08-02 MED ORDER — CAPSAICIN 0.025 % EX CREA
TOPICAL_CREAM | Freq: Two times a day (BID) | CUTANEOUS | Status: DC
  Filled 2018-08-02 (×2): qty 60

## 2018-08-02 MED ORDER — METOCLOPRAMIDE HCL 5 MG/ML IJ SOLN
10.0000 mg | Freq: Once | INTRAMUSCULAR | Status: AC
  Administered 2018-08-02: 10 mg via INTRAVENOUS
  Filled 2018-08-02: qty 2

## 2018-08-02 MED ORDER — DIPHENHYDRAMINE HCL 50 MG/ML IJ SOLN
25.0000 mg | Freq: Once | INTRAMUSCULAR | Status: AC
  Administered 2018-08-02: 25 mg via INTRAVENOUS
  Filled 2018-08-02: qty 1

## 2018-08-02 NOTE — ED Notes (Signed)
Bed: [id number] Expected date:  Expected time:  Means of arrival:  Comments: EMS seen yesterday for same RUQ pain 37yo

## 2018-08-02 NOTE — Discharge Instructions (Signed)
Use capsaicin as directed  Reglan for vomiting

## 2018-08-02 NOTE — ED Notes (Signed)
Pt observed laying on the floor, prone, doing intermittent pushups. Advised that it would be more comfortable to be in the stretcher.

## 2018-08-02 NOTE — ED Notes (Signed)
Pt informed of need for urine sample.  Pt stated he is unable to void at this time.  RN informed pt that pain meds were just given and will allow for time to let them to take effect, at which point staff will return for urine sample.

## 2018-08-02 NOTE — ED Provider Notes (Addendum)
[facility]-EMERGENCY DEPT Provider Note   CSN: [id number] Arrival date & time: 08/02/18  0855     History   Chief Complaint Chief Complaint  Patient presents with  . Abdominal Pain    HPI NAMEER Matthew Scott is a 37 y.o. male.  The history is provided by the patient. No language interpreter was used.  Abdominal Pain   This is a recurrent problem. The current episode started yesterday. The problem occurs constantly. The problem has been gradually worsening. The pain is associated with eating. The pain is located in the generalized abdominal region. The pain is moderate. Nothing aggravates the symptoms. Nothing relieves the symptoms. Past workup includes GI consult.   Pt has a history of gastroparesis.  Pt reports he was here yesterday.  Pt continues to vomit today   Past Medical History:  Diagnosis Date  . 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 Chronic abdominal pain   . Cyclical vomiting syndrome   . GASTRITIS 12/09/2009   Qualifier: Diagnosis of  By: Daphine Deutscher FNP, Zena Amos    . Gastroparesis   . 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 Nausea and vomiting    chronic, recurrent  . Polysubstance abuse [facility])     Patient Active Problem List   Diagnosis Date Noted  . Uncontrollable vomiting 03/10/2016  . Anemia 03/10/2016  . Metatarsal fracture 12/22/2015  . Essential hypertension 12/22/2015  . Night sweats 01/21/2015  . GERD (gastroesophageal reflux disease) 01/21/2015  . Gastritis 12/19/2014  . Hyperbilirubinemia 12/19/2014  . Elevated bilirubin   . Nausea & vomiting 12/14/2014  . Severe tetrahydrocannabinol (THC) dependence (HCC) 12/14/2014  . Alcohol abuse 12/14/2014  . Cyclic vomiting syndrome 12/06/2014  . 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 Gastroparesis 05/11/2012  . Abdominal pain 05/06/2012  .  Polysubstance abuse (HCC) 05/06/2012  . CHOLECYSTECTOMY, HX OF 11/25/2009    Past Surgical History:  Procedure Laterality Date  . CHOLECYSTECTOMY  11/2009        Home Medications    Prior to Admission medications   Medication Sig Start Date End Date Taking? Authorizing Provider  ondansetron (ZOFRAN ODT) 4 MG disintegrating tablet Take 1 tablet (4 mg total) by mouth every 8 (eight) hours as needed for nausea or vomiting. 08/01/18  Yes Harlene Salts A, PA-C  capsaicin (ZOSTRIX) 0.025 % cream Apply topically 2 (two) times daily. Apply to your abdomen 2-3 times a day if you are having problems with nausea, vomiting and pain.  Also take a dose of Reglan and Benadryl as prescribed. Patient not taking: Reported on 08/01/2018 02/21/18   Arby Barrette, MD  Capsaicin-Menthol-Methyl Sal (CAPSAICIN-METHYL SAL-MENTHOL) 0.025-1-12 % CREA Apply 1 inch topically 2 (two) times daily. Patient not taking: Reported on 08/01/2018 02/19/18   Kellie Shropshire, PA-C  dicyclomine (BENTYL) 20 MG tablet Take 1 tablet (20 mg total) by mouth 2 (two) times daily. Patient not taking: Reported on 08/01/2018 11/04/17   Aviva Kluver B, PA-C  diphenhydrAMINE (BENADRYL) 25 MG tablet Take 1 tablet (25 mg total) by mouth every 6 (six) hours as needed. Take with Reglan to try to control nausea and vomiting. Patient not taking: Reported on 08/01/2018 02/21/18   Arby Barrette, MD  lidocaine (XYLOCAINE) 2 % solution Use as directed 15 mLs every 3 (three) hours as needed in the mouth or throat for mouth pain. Patient not  taking: Reported on 08/01/2018 07/01/17   McDonald, Mia A, PA-C  lisinopril (PRINIVIL,ZESTRIL) 5 MG tablet Take 1 tablet (5 mg total) by mouth daily. Patient not taking: Reported on 08/01/2018 08/10/16   Hoy Register, MD  metoCLOPramide (REGLAN) 10 MG tablet Take 1 tablet (10 mg total) by mouth 3 (three) times daily before meals. Patient not taking: Reported on 08/01/2018 03/13/18   Vivianne Master, PA-C    omeprazole (PRILOSEC) 20 MG capsule Take 1 capsule (20 mg total) by mouth daily. Patient not taking: Reported on 08/01/2018 02/21/18   Arby Barrette, MD  pantoprazole (PROTONIX) 40 MG tablet Take 1 tablet (40 mg total) by mouth daily. Patient not taking: Reported on 08/01/2018 03/13/18   Vivianne Master, PA-C  potassium chloride SA (K-DUR,KLOR-CON) 20 MEQ tablet Take 1 tablet (20 mEq total) by mouth daily for 3 doses. Patient not taking: Reported on 05/02/2018 11/04/17 05/02/18  Elisha Ponder, PA-C    Family History Family History  Problem Relation Age of Onset  . Other Father   . Diabetes Father   . Hypertension Father     Social History Social History   Tobacco Use  . Smoking status: Current Some Day Smoker    Years: 8.00    Types: Cigars    Last attempt to quit: 12/15/2014    Years since quitting: 3.6  . Smokeless tobacco: Never Used  Substance Use Topics  . Alcohol use: No    Alcohol/week: 0.0 standard drinks  . Drug use: Yes    Types: Marijuana    Comment: Last used: 4 days ago per patient on 04/30/2018     Allergies   Patient has no known allergies.   Review of Systems Review of Systems  Gastrointestinal: Positive for abdominal pain.  All other systems reviewed and are negative.    Physical Exam Updated Vital Signs BP (!) 170/85   Pulse (!) 54   Temp 97.8 F (36.6 C) (Oral)   Resp 20   SpO2 99%   Physical Exam Vitals signs and nursing note reviewed.  Constitutional:      Appearance: He is well-developed.  HENT:     Head: Normocephalic.     Mouth/Throat:     Mouth: Mucous membranes are moist.  Cardiovascular:     Rate and Rhythm: Normal rate.  Pulmonary:     Effort: Pulmonary effort is normal.  Abdominal:     General: Abdomen is flat. Bowel sounds are normal.     Palpations: Abdomen is soft.     Tenderness: There is generalized abdominal tenderness.  Skin:    General: Skin is warm.  Neurological:     General: No focal deficit present.      Mental Status: He is alert.  Psychiatric:        Mood and Affect: Mood normal.      ED Treatments / Results  Labs (all labs ordered are listed, but only abnormal results are displayed) Labs Reviewed  CBC WITH DIFFERENTIAL/PLATELET - Abnormal; Notable for the following components:      Result Value   WBC 16.9 (*)    RBC 3.86 (*)    Hemoglobin 12.5 (*)    HCT 38.5 (*)    Neutro Abs 14.4 (*)    Monocytes Absolute 1.1 (*)    Abs Immature Granulocytes 0.10 (*)    All other components within normal limits  COMPREHENSIVE METABOLIC PANEL - Abnormal; Notable for the following components:   Glucose, Bld 169 (*)  Total Bilirubin 2.1 (*)    All other components within normal limits  URINALYSIS, ROUTINE W REFLEX MICROSCOPIC - Abnormal; Notable for the following components:   Glucose, UA 50 (*)    Ketones, ur 80 (*)    All other components within normal limits  RAPID URINE DRUG SCREEN, HOSP PERFORMED - Abnormal; Notable for the following components:   Opiates POSITIVE (*)    Tetrahydrocannabinol POSITIVE (*)    All other components within normal limits  LIPASE, BLOOD    EKG None  Radiology Ct Abdomen Pelvis W Contrast  Result Date: 08/01/2018 CLINICAL DATA:  37 year old with acute abdominal pain and fever. EXAM: CT ABDOMEN AND PELVIS WITH CONTRAST TECHNIQUE: Multidetector CT imaging of the abdomen and pelvis was performed using the standard protocol following bolus administration of intravenous contrast. CONTRAST:  100mL ISOVUE-300 IOPAMIDOL (ISOVUE-300) INJECTION 61% COMPARISON:  11/04/2017 FINDINGS: Lower chest: Lung bases are clear. Hepatobiliary: Punctate low-density structure near the hepatic dome is unchanged and could represent a small cyst. Gallbladder has been removed. Portal venous system is patent. No biliary dilatation. Pancreas: Unremarkable. No pancreatic ductal dilatation or surrounding inflammatory changes. Spleen: Normal in size without focal abnormality.  Adrenals/Urinary Tract: Normal adrenal glands. Normal appearance of the urinary bladder. Punctate low-density in the right kidney probably represents a cyst and similar to the previous examination. Nonobstructive 5 mm stone in the right kidney upper pole. No evidence for left renal calculi. Stomach/Bowel: Normal appearance of the stomach. No evidence for bowel dilatation. There may be a slightly thickened loop of colon in the left lower quadrant possibly representing the proximal sigmoid colon. There are no significant inflammatory changes around this mildly thickened loop of bowel. Vascular/Lymphatic: No significant vascular findings are present. No enlarged abdominal or pelvic lymph nodes. Reproductive: Prostate is unremarkable. Other: Trace free fluid in the pelvis. Densities along the anterior abdominal wall compatible with prior surgery. Musculoskeletal: Small lucencies in the pelvic bones have not significantly changed since 12/09/2015. No acute bone abnormality. IMPRESSION: 1. Trace free fluid in the pelvis could represent an underlying inflammatory process. Question a mildly thickened loop of bowel in the left lower quadrant which may represent large bowel. Small focus of enteritis or colitis cannot be excluded. 2. Nonobstructive right kidney stone. Electronically Signed   By: Richarda OverlieAdam  Henn M.D.   On: 08/01/2018 19:59    Procedures Procedures (including critical care time)  Medications Ordered in ED Medications  capsaicin (ZOSTRIX) 0.025 % cream (has no administration in time range)  sodium chloride 0.9 % bolus 1,000 mL (0 mLs Intravenous Stopped 08/02/18 1018)  morphine 4 MG/ML injection 4 mg (4 mg Intravenous Given 08/02/18 1015)  metoCLOPramide (REGLAN) injection 10 mg (10 mg Intravenous Given 08/02/18 1108)  diphenhydrAMINE (BENADRYL) injection 25 mg (25 mg Intravenous Given 08/02/18 1108)  sodium chloride 0.9 % bolus 1,000 mL (1,000 mLs Intravenous New Bag/Given 08/02/18 1115)     Initial  Impression / Assessment and Plan / ED Course  I have reviewed the triage vital signs and the nursing notes.  Pertinent labs & imaging results that were available during my care of the patient were reviewed by me and considered in my medical decision making (see chart for details).     MDM  IV NS x 2liters.   Pt's labs reviewed.  Pt had a ct scan yesterday.   Pt advised he needs to follow up with his Gi doctor.  Pt is positive for THC.  Pt has been positive for THC on multiple  visits.  Pt counseled on gi issues possibly being related to this. Pt advised to continue reglan and capsaicin cream.   Final Clinical Impressions(s) / ED Diagnoses   Final diagnoses:  Generalized abdominal pain  Nausea and vomiting, intractability of vomiting not specified, unspecified vomiting type    ED Discharge Orders    None    An After Visit Summary was printed and given to the patient.   Elson AreasSofia, Ashyr Hedgepath K, PA-C 08/02/18 1301    Elson AreasSofia, Tamina Cyphers K, PA-C 08/02/18 1316    Azalia Bilisampos, Kevin, MD 08/02/18 [phone]

## 2018-08-02 NOTE — ED Notes (Signed)
NT walked into room to find pt. Had made pallet in floor and was laying there. Pt advised not to lay in floor pt back to bed

## 2018-08-02 NOTE — ED Notes (Addendum)
This RN entered pts room to find that pt had disconnected saline bolus fluids from IV access for second time.  When asked to why he disconnected his IV, pt stated that he "didn't want it, it wasn't letting"... him "move around".  This RN informed pt of the importance of receiving the IV fluids that provider ordered and it was safest for him to remain in bed.  RN informed pt that there were emesis bags within reach as well as a urinal if needed.

## 2018-08-02 NOTE — ED Notes (Signed)
RN called to room by NT because saline bolus had become unattached to IV access and was flowing onto floor.  When questioned pt as to what happened, pt stated "it just fell out."  IV was cleaned with CHG wipe, reattached to pt and RN reminded pt to be careful with IV access, as fluids are being provided.

## 2018-08-02 NOTE — ED Triage Notes (Addendum)
Pt BIB EMS from home with c/o RUQ pain.   [facility] was [facility].  Hx of gastritis.  Pt was diaphoretic on scene, vagaled from 70 bpm to appx 40 bpm on scene, per EMS.     Received PTA: 4mg  Zofran 150 mcg fentanyl

## 2018-08-04 ENCOUNTER — Other Ambulatory Visit: Payer: Self-pay

## 2018-08-04 ENCOUNTER — Emergency Department (HOSPITAL_COMMUNITY): Payer: Self-pay

## 2018-08-04 ENCOUNTER — Observation Stay (HOSPITAL_COMMUNITY)
Admission: EM | Admit: 2018-08-04 | Discharge: 2018-08-06 | Disposition: A | Discharge status: Home / Self Care | Payer: Self-pay | Attending: Family Medicine | Admitting: Family Medicine

## 2018-08-04 ENCOUNTER — Encounter (HOSPITAL_COMMUNITY): Payer: Self-pay

## 2018-08-04 DIAGNOSIS — Z23 Encounter for immunization: Secondary | ICD-10-CM | POA: Insufficient documentation

## 2018-08-04 DIAGNOSIS — F1220 Cannabis dependence, uncomplicated: Secondary | ICD-10-CM | POA: Diagnosis present

## 2018-08-04 DIAGNOSIS — R1011 Right upper quadrant pain: Secondary | ICD-10-CM | POA: Insufficient documentation

## 2018-08-04 DIAGNOSIS — K3184 Gastroparesis: Secondary | ICD-10-CM | POA: Diagnosis present

## 2018-08-04 DIAGNOSIS — I10 Essential (primary) hypertension: Secondary | ICD-10-CM | POA: Diagnosis present

## 2018-08-04 DIAGNOSIS — R109 Unspecified abdominal pain: Secondary | ICD-10-CM | POA: Diagnosis present

## 2018-08-04 DIAGNOSIS — R7401 Elevation of levels of liver transaminase levels: Secondary | ICD-10-CM

## 2018-08-04 DIAGNOSIS — F12188 Cannabis abuse with other cannabis-induced disorder: Secondary | ICD-10-CM | POA: Diagnosis present

## 2018-08-04 DIAGNOSIS — R17 Unspecified jaundice: Secondary | ICD-10-CM | POA: Diagnosis present

## 2018-08-04 DIAGNOSIS — R1110 Vomiting, unspecified: Secondary | ICD-10-CM | POA: Diagnosis present

## 2018-08-04 DIAGNOSIS — R740 Nonspecific elevation of levels of transaminase and lactic acid dehydrogenase [LDH]: Secondary | ICD-10-CM | POA: Insufficient documentation

## 2018-08-04 DIAGNOSIS — R1115 Cyclical vomiting syndrome unrelated to migraine: Secondary | ICD-10-CM | POA: Diagnosis present

## 2018-08-04 DIAGNOSIS — R112 Nausea with vomiting, unspecified: Secondary | ICD-10-CM | POA: Diagnosis present

## 2018-08-04 DIAGNOSIS — J9859 Other diseases of mediastinum, not elsewhere classified: Principal | ICD-10-CM | POA: Diagnosis present

## 2018-08-04 DIAGNOSIS — K219 Gastro-esophageal reflux disease without esophagitis: Secondary | ICD-10-CM | POA: Diagnosis present

## 2018-08-04 DIAGNOSIS — D649 Anemia, unspecified: Secondary | ICD-10-CM | POA: Diagnosis present

## 2018-08-04 DIAGNOSIS — Z79899 Other long term (current) drug therapy: Secondary | ICD-10-CM | POA: Insufficient documentation

## 2018-08-04 DIAGNOSIS — F1210 Cannabis abuse, uncomplicated: Secondary | ICD-10-CM | POA: Diagnosis present

## 2018-08-04 DIAGNOSIS — R1084 Generalized abdominal pain: Secondary | ICD-10-CM | POA: Insufficient documentation

## 2018-08-04 LAB — COMPREHENSIVE METABOLIC PANEL
ALT: 35 U/L (ref 0–44)
ALT: 40 U/L (ref 0–44)
AST: 93 U/L — ABNORMAL HIGH (ref 15–41)
AST: 105 U/L — ABNORMAL HIGH (ref 15–41)
Albumin: 4.5 g/dL (ref 3.5–5.0)
Albumin: 4.2 g/dL (ref 3.5–5.0)
Alkaline Phosphatase: 53 U/L (ref 38–126)
Alkaline Phosphatase: 47 U/L (ref 38–126)
Anion gap: 18 — ABNORMAL HIGH (ref 5–15)
Anion gap: 11 (ref 5–15)
BUN: 16 mg/dL (ref 6–20)
BUN: 12 mg/dL (ref 6–20)
CO2: 23 mmol/L (ref 22–32)
CO2: 28 mmol/L (ref 22–32)
Calcium: 9.4 mg/dL (ref 8.9–10.3)
Calcium: 9.2 mg/dL (ref 8.9–10.3)
Chloride: 98 mmol/L (ref 98–111)
Chloride: 98 mmol/L (ref 98–111)
Creatinine, Ser: 1.12 mg/dL (ref 0.61–1.24)
Creatinine, Ser: 0.96 mg/dL (ref 0.61–1.24)
GFR calc Af Amer: >60 mL/min (ref >60)
GFR calc Af Amer: >60 mL/min (ref >60)
GFR calc non Af Amer: >60 mL/min (ref >60)
GFR calc non Af Amer: >60 mL/min (ref >60)
Glucose, Bld: 115 mg/dL — ABNORMAL HIGH (ref 70–99)
Glucose, Bld: 98 mg/dL (ref 70–99)
Potassium: 3.3 mmol/L — ABNORMAL LOW (ref 3.5–5.1)
Potassium: 3.3 mmol/L — ABNORMAL LOW (ref 3.5–5.1)
Sodium: 139 mmol/L (ref 135–145)
Sodium: 137 mmol/L (ref 135–145)
Total Bilirubin: 3.4 mg/dL — ABNORMAL HIGH (ref 0.3–1.2)
Total Bilirubin: 3.2 mg/dL — ABNORMAL HIGH (ref 0.3–1.2)
Total Protein: 7 g/dL (ref 6.5–8.1)
Total Protein: 6.5 g/dL (ref 6.5–8.1)

## 2018-08-04 LAB — URINALYSIS, ROUTINE W REFLEX MICROSCOPIC
Bacteria, UA: NONE SEEN
Bilirubin Urine: NEGATIVE
Glucose, UA: NEGATIVE mg/dL
Ketones, ur: 80 mg/dL — AB
Leukocytes, UA: NEGATIVE
Nitrite: NEGATIVE
Protein, ur: 30 mg/dL — AB
Specific Gravity, Urine: 1.028 (ref 1.005–1.030)
pH: 6 (ref 5.0–8.0)

## 2018-08-04 LAB — TSH: TSH: 1.037 u[IU]/mL (ref 0.350–4.500)

## 2018-08-04 LAB — CBC
HCT: 37.6 % — ABNORMAL LOW (ref 39.0–52.0)
Hemoglobin: 12.5 g/dL — ABNORMAL LOW (ref 13.0–17.0)
MCH: 32 pg (ref 26.0–34.0)
MCHC: 33.2 g/dL (ref 30.0–36.0)
MCV: 96.2 fL (ref 80.0–100.0)
Platelets: 249 10*3/uL (ref 150–400)
RBC: 3.91 MIL/uL — ABNORMAL LOW (ref 4.22–5.81)
RDW: 11.8 % (ref 11.5–15.5)
WBC: 12 10*3/uL — ABNORMAL HIGH (ref 4.0–10.5)
nRBC: 0 % (ref 0.0–0.2)

## 2018-08-04 LAB — I-STAT CG4 LACTIC ACID, ED
Lactic Acid, Venous: 3.8 mmol/L (ref 0.5–1.9)
Lactic Acid, Venous: 1.57 mmol/L (ref 0.5–1.9)

## 2018-08-04 LAB — I-STAT TROPONIN, ED: Troponin i, poc: 0.01 ng/mL (ref 0.00–0.08)

## 2018-08-04 LAB — LIPASE, BLOOD: Lipase: 21 U/L (ref 11–51)

## 2018-08-04 LAB — MAGNESIUM: Magnesium: 2 mg/dL (ref 1.7–2.4)

## 2018-08-04 MED ORDER — CAPSAICIN 0.025 % EX CREA: TOPICAL_CREAM | Freq: Two times a day (BID) | CUTANEOUS | Status: DC

## 2018-08-04 MED ORDER — POTASSIUM CHLORIDE CRYS ER 20 MEQ PO TBCR
40.0000 meq | EXTENDED_RELEASE_TABLET | Freq: Once | ORAL | Status: AC
  Administered 2018-08-04: 40 meq via ORAL
  Filled 2018-08-04: qty 2

## 2018-08-04 MED ORDER — PANTOPRAZOLE SODIUM 40 MG PO TBEC
40.0000 mg | DELAYED_RELEASE_TABLET | Freq: Every day | ORAL | Status: DC
  Administered 2018-08-04 – 2018-08-06 (×3): 40 mg via ORAL
  Filled 2018-08-04 (×3): qty 1

## 2018-08-04 MED ORDER — CAPSAICIN 0.025 % EX CREA
TOPICAL_CREAM | Freq: Once | CUTANEOUS | Status: AC
  Administered 2018-08-04: 10:00:00 via TOPICAL
  Filled 2018-08-04: qty 60

## 2018-08-04 MED ORDER — LACTATED RINGERS IV BOLUS
1000.0000 mL | Freq: Once | INTRAVENOUS | Status: AC
  Administered 2018-08-04: 1000 mL via INTRAVENOUS

## 2018-08-04 MED ORDER — LIDOCAINE VISCOUS HCL 2 % MT SOLN
15.0000 mL | OROMUCOSAL | Status: DC | PRN
  Filled 2018-08-04: qty 15

## 2018-08-04 MED ORDER — BISACODYL 10 MG RE SUPP
10.0000 mg | Freq: Every day | RECTAL | Status: DC | PRN
  Administered 2018-08-05: 10 mg via RECTAL
  Filled 2018-08-04: qty 1

## 2018-08-04 MED ORDER — ONDANSETRON HCL 4 MG/2ML IJ SOLN: 4.0000 mg | Freq: Four times a day (QID) | INTRAMUSCULAR | Status: DC | PRN

## 2018-08-04 MED ORDER — IOPAMIDOL (ISOVUE-300) INJECTION 61%
INTRAVENOUS | Status: AC
  Administered 2018-08-04: 13:00:00
  Filled 2018-08-04: qty 100

## 2018-08-04 MED ORDER — METOCLOPRAMIDE HCL 5 MG/ML IJ SOLN
10.0000 mg | Freq: Once | INTRAMUSCULAR | Status: AC
  Administered 2018-08-04: 10 mg via INTRAVENOUS
  Filled 2018-08-04: qty 2

## 2018-08-04 MED ORDER — ONDANSETRON HCL 4 MG PO TABS: 4.0000 mg | ORAL_TABLET | Freq: Four times a day (QID) | ORAL | Status: DC | PRN

## 2018-08-04 MED ORDER — POTASSIUM CHLORIDE IN NACL 40-0.9 MEQ/L-% IV SOLN
INTRAVENOUS | Status: DC
  Administered 2018-08-04 – 2018-08-06 (×4): 100 mL/h via INTRAVENOUS
  Filled 2018-08-04 (×6): qty 1000

## 2018-08-04 MED ORDER — ACETAMINOPHEN 325 MG PO TABS
650.0000 mg | ORAL_TABLET | Freq: Four times a day (QID) | ORAL | Status: DC | PRN
  Administered 2018-08-04: 650 mg via ORAL
  Filled 2018-08-04: qty 2

## 2018-08-04 MED ORDER — IOPAMIDOL (ISOVUE-300) INJECTION 61%
100.0000 mL | Freq: Once | INTRAVENOUS | Status: AC | PRN
  Administered 2018-08-04: 100 mL via INTRAVENOUS

## 2018-08-04 MED ORDER — SENNOSIDES-DOCUSATE SODIUM 8.6-50 MG PO TABS
1.0000 | ORAL_TABLET | Freq: Two times a day (BID) | ORAL | Status: DC
  Administered 2018-08-04 – 2018-08-05 (×3): 1 via ORAL
  Filled 2018-08-04 (×3): qty 1

## 2018-08-04 MED ORDER — CAPSAICIN 0.025 % EX CREA
1.0000 g | TOPICAL_CREAM | Freq: Two times a day (BID) | CUTANEOUS | Status: DC
  Administered 2018-08-04 – 2018-08-05 (×3): 1 via TOPICAL
  Filled 2018-08-04 (×2): qty 60

## 2018-08-04 MED ORDER — MORPHINE SULFATE (PF) 4 MG/ML IV SOLN
6.0000 mg | Freq: Once | INTRAVENOUS | Status: AC
  Administered 2018-08-04: 6 mg via INTRAVENOUS
  Filled 2018-08-04: qty 2

## 2018-08-04 MED ORDER — POLYETHYLENE GLYCOL 3350 17 G PO PACK
17.0000 g | PACK | Freq: Two times a day (BID) | ORAL | Status: DC
  Administered 2018-08-04 – 2018-08-05 (×2): 17 g via ORAL
  Filled 2018-08-04 (×3): qty 1

## 2018-08-04 MED ORDER — POTASSIUM CHLORIDE CRYS ER 20 MEQ PO TBCR
20.0000 meq | EXTENDED_RELEASE_TABLET | Freq: Every day | ORAL | Status: DC
  Administered 2018-08-05 – 2018-08-06 (×2): 20 meq via ORAL
  Filled 2018-08-04 (×2): qty 1

## 2018-08-04 MED ORDER — INFLUENZA VAC SPLIT QUAD 0.5 ML IM SUSY
0.5000 mL | PREFILLED_SYRINGE | INTRAMUSCULAR | Status: AC
  Administered 2018-08-05: 0.5 mL via INTRAMUSCULAR
  Filled 2018-08-04: qty 0.5

## 2018-08-04 MED ORDER — METOCLOPRAMIDE HCL 10 MG PO TABS
10.0000 mg | ORAL_TABLET | Freq: Four times a day (QID) | ORAL | Status: DC
  Administered 2018-08-04 – 2018-08-06 (×7): 10 mg via ORAL
  Filled 2018-08-04 (×7): qty 1

## 2018-08-04 MED ORDER — ACETAMINOPHEN 650 MG RE SUPP: 650.0000 mg | Freq: Four times a day (QID) | RECTAL | Status: DC | PRN

## 2018-08-04 MED ORDER — SODIUM CHLORIDE 0.9 % IV BOLUS: 1000.0000 mL | Freq: Once | INTRAVENOUS | Status: DC

## 2018-08-04 MED ORDER — HEPARIN SODIUM (PORCINE) 5000 UNIT/ML IJ SOLN
5000.0000 [IU] | Freq: Three times a day (TID) | INTRAMUSCULAR | Status: DC
  Administered 2018-08-04 – 2018-08-06 (×5): 5000 [IU] via SUBCUTANEOUS
  Filled 2018-08-04 (×5): qty 1

## 2018-08-04 MED ORDER — DIPHENHYDRAMINE HCL 50 MG/ML IJ SOLN
25.0000 mg | Freq: Once | INTRAMUSCULAR | Status: AC
  Administered 2018-08-04: 25 mg via INTRAVENOUS
  Filled 2018-08-04: qty 1

## 2018-08-04 MED ORDER — HYDROCODONE-ACETAMINOPHEN 5-325 MG PO TABS
1.0000 | ORAL_TABLET | Freq: Four times a day (QID) | ORAL | Status: DC | PRN
  Administered 2018-08-04: 1 via ORAL
  Administered 2018-08-05 – 2018-08-06 (×4): 2 via ORAL
  Filled 2018-08-04 (×2): qty 2
  Filled 2018-08-04: qty 1
  Filled 2018-08-04 (×2): qty 2

## 2018-08-04 MED ORDER — DICYCLOMINE HCL 20 MG PO TABS
20.0000 mg | ORAL_TABLET | Freq: Two times a day (BID) | ORAL | Status: DC
  Administered 2018-08-04 – 2018-08-06 (×4): 20 mg via ORAL
  Filled 2018-08-04 (×4): qty 1

## 2018-08-04 MED ORDER — LISINOPRIL 5 MG PO TABS
5.0000 mg | ORAL_TABLET | Freq: Every day | ORAL | Status: DC
  Administered 2018-08-04 – 2018-08-06 (×3): 5 mg via ORAL
  Filled 2018-08-04 (×3): qty 1

## 2018-08-04 NOTE — ED Provider Notes (Signed)
[facility]-EMERGENCY DEPT Provider Note   CSN: [id number] Arrival date & time: 08/04/18  4098     History   Chief Complaint Chief Complaint  Patient presents with  . Abdominal Pain  . Vomiting    [name] is a 37 y.o. male.  HPI  Patient is a 37 year old male with a history of recurrent nausea and vomiting, THC use, GERD, gastritis presenting for recurrent nausea vomiting unable to tolerate p.o. at home.  This is patient's third visit in 3 days for symptoms.  He reports that he will feel better coming to the emergency department, however when he goes home he will begin having severe and recurrent crampy abdominal pain as well as emesis.  He reports that all he is vomiting up at this point is bile and denies any hematemesis.  He reports that the pain is epigastric in nature and is in the same location that he usually gets it.  Denies any fever or chills.  Denies a bowel movement in a couple days.  Denies diarrhea.  Denies dysuria, urgency, frequency, testicular pain or swelling.  Patient reports that he has not used cannabis in the last few days since he has been ill, but otherwise daily uses cannabis.  Patient reports that he has seen GI, but not recently.  Past Medical History:  Diagnosis Date  . 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 Chronic abdominal pain   . Cyclical vomiting syndrome   . GASTRITIS 12/09/2009   Qualifier: Diagnosis of  By: Daphine Deutscher FNP, Zena Amos    . Gastroparesis   . 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 Nausea and vomiting    chronic, recurrent  . Polysubstance abuse [facility])     Patient Active Problem List   Diagnosis Date Noted  . Uncontrollable vomiting 03/10/2016  . Anemia 03/10/2016  . Metatarsal fracture 12/22/2015  . Essential hypertension 12/22/2015  . Night sweats 01/21/2015  . GERD (gastroesophageal reflux  disease) 01/21/2015  . Gastritis 12/19/2014  . Hyperbilirubinemia 12/19/2014  . Elevated bilirubin   . Nausea & vomiting 12/14/2014  . Severe tetrahydrocannabinol (THC) dependence (HCC) 12/14/2014  . Alcohol abuse 12/14/2014  . Cyclic vomiting syndrome 12/06/2014  . 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 Gastroparesis 05/11/2012  . Abdominal pain 05/06/2012  . Polysubstance abuse (HCC) 05/06/2012  . CHOLECYSTECTOMY, HX OF 11/25/2009    Past Surgical History:  Procedure Laterality Date  . CHOLECYSTECTOMY  11/2009        Home Medications    Prior to Admission medications   Medication Sig Start Date End Date Taking? Authorizing Provider  metoCLOPramide (REGLAN) 10 MG tablet Take 1 tablet (10 mg total) by mouth every 6 (six) hours. 08/02/18  Yes Cheron Schaumann K, PA-C  ondansetron (ZOFRAN ODT) 4 MG disintegrating tablet Take 1 tablet (4 mg total) by mouth every 8 (eight) hours as needed for nausea or vomiting. 08/01/18  Yes Harlene Salts A, PA-C  capsaicin (ZOSTRIX) 0.025 % cream Apply topically 2 (two) times daily. Apply to your abdomen 2-3 times a day if you are having problems with nausea, vomiting and pain.  Also take a dose of Reglan and Benadryl as prescribed. Patient not taking: Reported on 08/01/2018 02/21/18   Arby Barrette, MD  Capsaicin-Menthol-Methyl Sal (CAPSAICIN-METHYL SAL-MENTHOL) 0.025-1-12 % CREA Apply 1 inch topically 2 (two) times daily. Patient not  taking: Reported on 08/01/2018 02/19/18   Kellie Shropshire, PA-C  dicyclomine (BENTYL) 20 MG tablet Take 1 tablet (20 mg total) by mouth 2 (two) times daily. Patient not taking: Reported on 08/01/2018 11/04/17   Aviva Kluver B, PA-C  diphenhydrAMINE (BENADRYL) 25 MG tablet Take 1 tablet (25 mg total) by mouth every 6 (six) hours as needed. Take with Reglan to try to control nausea and vomiting. Patient not taking: Reported on 08/01/2018 02/21/18   Arby Barrette, MD  lidocaine  (XYLOCAINE) 2 % solution Use as directed 15 mLs every 3 (three) hours as needed in the mouth or throat for mouth pain. Patient not taking: Reported on 08/01/2018 07/01/17   McDonald, Mia A, PA-C  lisinopril (PRINIVIL,ZESTRIL) 5 MG tablet Take 1 tablet (5 mg total) by mouth daily. Patient not taking: Reported on 08/01/2018 08/10/16   Hoy Register, MD  omeprazole (PRILOSEC) 20 MG capsule Take 1 capsule (20 mg total) by mouth daily. Patient not taking: Reported on 08/01/2018 02/21/18   Arby Barrette, MD  pantoprazole (PROTONIX) 40 MG tablet Take 1 tablet (40 mg total) by mouth daily. Patient not taking: Reported on 08/01/2018 03/13/18   Vivianne Master, PA-C  potassium chloride SA (K-DUR,KLOR-CON) 20 MEQ tablet Take 1 tablet (20 mEq total) by mouth daily for 3 doses. Patient not taking: Reported on 05/02/2018 11/04/17 05/02/18  Elisha Ponder, PA-C    Family History Family History  Problem Relation Age of Onset  . Other Father   . Diabetes Father   . Hypertension Father     Social History Social History   Tobacco Use  . Smoking status: Current Some Day Smoker    Years: 8.00    Types: Cigars    Last attempt to quit: 12/15/2014    Years since quitting: 3.6  . Smokeless tobacco: Never Used  Substance Use Topics  . Alcohol use: No    Alcohol/week: 0.0 standard drinks  . Drug use: Yes    Types: Marijuana    Comment: Last used: 4 days ago per patient on 04/30/2018     Allergies   Patient has no known allergies.   Review of Systems Review of Systems  Constitutional: Negative for chills and fever.  HENT: Negative for congestion and sore throat.   Respiratory: Negative for cough, chest tightness and shortness of breath.   Cardiovascular: Negative for chest pain, palpitations and leg swelling.  Gastrointestinal: Positive for abdominal pain, nausea and vomiting. Negative for constipation and diarrhea.  Genitourinary: Negative for dysuria, flank pain, scrotal swelling and testicular  pain.  Musculoskeletal: Negative for back pain and myalgias.  Skin: Negative for rash.  Neurological: Negative for dizziness, syncope, light-headedness and headaches.  All other systems reviewed and are negative.    Physical Exam Updated Vital Signs BP 138/76 (BP Location: Right Arm)   Pulse 67   Temp 99.3 F (37.4 C) (Oral)   Resp 17   Ht 5\' 9"  (1.753 m)   Wt 86 kg   SpO2 97%   BMI 28.00 kg/m   Physical Exam Vitals signs and nursing note reviewed.  Constitutional:      General: He is not in acute distress.    Appearance: He is well-developed.  HENT:     Head: Normocephalic and atraumatic.     Mouth/Throat:     Comments: Mucous membranes dry.  Eyes:     Conjunctiva/sclera: Conjunctivae normal.     Pupils: Pupils are equal, round, and reactive to light.  Neck:  Musculoskeletal: Normal range of motion and neck supple.  Cardiovascular:     Rate and Rhythm: Normal rate and regular rhythm.     Heart sounds: S1 normal and S2 normal. No murmur.  Pulmonary:     Effort: Pulmonary effort is normal.     Breath sounds: Normal breath sounds. No wheezing or rales.  Abdominal:     General: Bowel sounds are normal. There is no distension.     Palpations: Abdomen is soft.     Tenderness: There is generalized abdominal tenderness and tenderness in the epigastric area. There is no guarding or rebound.  Musculoskeletal: Normal range of motion.        General: No deformity.  Lymphadenopathy:     Cervical: No cervical adenopathy.  Skin:    General: Skin is warm and dry.     Findings: No erythema or rash.  Neurological:     Mental Status: He is alert.     Comments: Cranial nerves grossly intact. Patient moves extremities symmetrically and with good coordination.  Psychiatric:        Behavior: Behavior normal.        Thought Content: Thought content normal.        Judgment: Judgment normal.      ED Treatments / Results  Labs (all labs ordered are listed, but only abnormal  results are displayed) Labs Reviewed  COMPREHENSIVE METABOLIC PANEL - Abnormal; Notable for the following components:      Result Value   Potassium 3.3 (*)    Glucose, Bld 115 (*)    AST 93 (*)    Total Bilirubin 3.4 (*)    Anion gap 18 (*)    All other components within normal limits  CBC - Abnormal; Notable for the following components:   WBC 12.0 (*)    RBC 3.91 (*)    Hemoglobin 12.5 (*)    HCT 37.6 (*)    All other components within normal limits  URINALYSIS, ROUTINE W REFLEX MICROSCOPIC - Abnormal; Notable for the following components:   Hgb urine dipstick SMALL (*)    Ketones, ur 80 (*)    Protein, ur 30 (*)    All other components within normal limits  I-STAT CG4 LACTIC ACID, ED - Abnormal; Notable for the following components:   Lactic Acid, Venous 3.80 (*)    All other components within normal limits  LIPASE, BLOOD  MAGNESIUM  I-STAT TROPONIN, ED  I-STAT CG4 LACTIC ACID, ED    EKG EKG Interpretation  Date/Time:  Saturday August 04 2018 07:24:02 EST Ventricular Rate:  65 PR Interval:    QRS Duration: 100 QT Interval:  379 QTC Calculation: 394 R Axis:   46 Text Interpretation:  Normal sinus rhythm Poor data quality Confirmed by Margarita Grizzleay, Danielle [phone]([id number]) on 08/04/2018 10:29:59 AM   Radiology Ct Chest W Contrast  Result Date: 08/04/2018 CLINICAL DATA:  Abdominal pain with nausea and vomiting as well as fever 3 days. EXAM: CT CHEST, ABDOMEN, AND PELVIS WITH CONTRAST TECHNIQUE: Multidetector CT imaging of the chest, abdomen and pelvis was performed following the standard protocol during bolus administration of intravenous contrast. CONTRAST:  100mL ISOVUE-300 IOPAMIDOL (ISOVUE-300) INJECTION 61% COMPARISON:  Abdominopelvic CT 08/01/2018, 11/04/2017 and 02/19/2016 FINDINGS: CT CHEST FINDINGS Cardiovascular: Heart is normal size. Thoracic aorta and pulmonary arterial system are within normal. Remaining vascular structures are unremarkable. Mediastinum/Nodes: There is a  heterogeneous enhancing lobulated anterior mediastinal mass measuring approximately 4.1 x 5.9 x 7.7 cm in AP, transverse and craniocaudal  dimensions. No hilar or mediastinal adenopathy. Remaining mediastinal structures are unremarkable. Lungs/Pleura: Lungs are well inflated without focal airspace consolidation or effusion. Airways are normal. Musculoskeletal: Normal. CT ABDOMEN PELVIS FINDINGS Hepatobiliary: There are a few small subcentimeter liver hypodensities too small to characterize but likely cysts. Previous cholecystectomy. Biliary tree is unremarkable. Pancreas: Normal. Spleen: Normal. Adrenals/Urinary Tract: Adrenal glands are normal. Kidneys normal in size without hydronephrosis. There are 2-3 small right renal stones with the largest measuring 3 mm over the upper pole. Ureters and bladder are normal. Stomach/Bowel: Stomach and small bowel are normal. Appendix is not visualized. Colon is within normal. Vascular/Lymphatic: Vascular structures are within normal. No adenopathy. Reproductive: Normal. Other: No free fluid or focal inflammatory change. Findings suggesting prior umbilical hernia repair. Musculoskeletal: Unremarkable. IMPRESSION: No acute findings in the chest, abdomen or pelvis. Heterogeneous lobulated anterior mediastinal mass measuring 4.1 x 5.9 x 7.7 cm. Differential diagnosis considerations include thymic neoplasm, germ-cell tumor, lymphoma and less likely metastatic disease. Recommend thoracic surgery consultation and consider PET-CT for further evaluation. Few subcentimeter liver hypodensities too small to characterize but likely cysts. Right-sided nephrolithiasis with the largest stone over the upper pole measuring 3 mm. Suggestion previous umbilical hernia repair. These results were called by telephone at the time of interpretation on 08/04/2018 at 1:17 p.m. to Dr. Aviva Kluver , who verbally acknowledged these results. Electronically Signed   By: Elberta Fortis M.D.   On: 08/04/2018  13:19   Ct Abdomen Pelvis W Contrast  Result Date: 08/04/2018 CLINICAL DATA:  Abdominal pain with nausea and vomiting as well as fever 3 days. EXAM: CT CHEST, ABDOMEN, AND PELVIS WITH CONTRAST TECHNIQUE: Multidetector CT imaging of the chest, abdomen and pelvis was performed following the standard protocol during bolus administration of intravenous contrast. CONTRAST:  ISOVUE-300 IOPAMIDOL (ISOVUE-300) INJECTION 61% COMPARISON:  Abdominopelvic CT 08/01/2018, 11/04/2017 and 02/19/2016 FINDINGS: CT CHEST FINDINGS Cardiovascular: Heart is normal size. Thoracic aorta and pulmonary arterial system are within normal. Remaining vascular structures are unremarkable. Mediastinum/Nodes: There is a heterogeneous enhancing lobulated anterior mediastinal mass measuring approximately 4.1 x 5.9 x 7.7 cm in AP, transverse and craniocaudal dimensions. No hilar or mediastinal adenopathy. Remaining mediastinal structures are unremarkable. Lungs/Pleura: Lungs are well inflated without focal airspace consolidation or effusion. Airways are normal. Musculoskeletal: Normal. CT ABDOMEN PELVIS FINDINGS Hepatobiliary: There are a few small subcentimeter liver hypodensities too small to characterize but likely cysts. Previous cholecystectomy. Biliary tree is unremarkable. Pancreas: Normal. Spleen: Normal. Adrenals/Urinary Tract: Adrenal glands are normal. Kidneys normal in size without hydronephrosis. There are 2-3 small right renal stones with the largest measuring 3 mm over the upper pole. Ureters and bladder are normal. Stomach/Bowel: Stomach and small bowel are normal. Appendix is not visualized. Colon is within normal. Vascular/Lymphatic: Vascular structures are within normal. No adenopathy. Reproductive: Normal. Other: No free fluid or focal inflammatory change. Findings suggesting prior umbilical hernia repair. Musculoskeletal: Unremarkable. IMPRESSION: No acute findings in the chest, abdomen or pelvis. Heterogeneous  lobulated anterior mediastinal mass measuring 4.1 x 5.9 x 7.7 cm. Differential diagnosis considerations include thymic neoplasm, germ-cell tumor, lymphoma and less likely metastatic disease. Recommend thoracic surgery consultation and consider PET-CT for further evaluation. Few subcentimeter liver hypodensities too small to characterize but likely cysts. Right-sided nephrolithiasis with the largest stone over the upper pole measuring 3 mm. Suggestion previous umbilical hernia repair. These results were called by telephone at the time of interpretation on 08/04/2018 at 1:17 p.m. to Dr. Aviva Kluver , who verbally acknowledged these results.  Electronically Signed   By: Elberta Fortisaniel  Boyle M.D.   On: 08/04/2018 13:19   Dg Abdomen Acute W/chest  Result Date: 08/04/2018 CLINICAL DATA:  Abdominal pain and vomiting 1 week. EXAM: DG ABDOMEN ACUTE W/ 1V CHEST COMPARISON:  Chest x-ray 02/26/2018, 02/02/2017 as well as chest x-ray 12/13/2015 FINDINGS: Lungs are adequately inflated and otherwise clear. Cardiac silhouette is normal. Worsening soft tissue prominence over the left hilum/main pulmonary artery segment. Remainder of the chest is unchanged. Abdominopelvic images demonstrate surgical clips over the right upper quadrant. Bowel gas pattern is nonobstructive. Remaining bones and soft tissues are normal. IMPRESSION: Nonobstructive bowel gas pattern. No acute cardiopulmonary disease. Soft tissue prominence of the left hilum/main pulmonary artery segment. Could not exclude adenopathy or mass. Recommend contrast-enhanced chest CT for further evaluation. Electronically Signed   By: Elberta Fortisaniel  Boyle M.D.   On: 08/04/2018 08:33   [place] Abdomen Limited Ruq  Result Date: 08/04/2018 CLINICAL DATA:  Right upper quadrant pain.  Cholecystectomy. EXAM: ULTRASOUND ABDOMEN LIMITED RIGHT UPPER QUADRANT COMPARISON:  CT 08/01/2018 FINDINGS: Gallbladder: Previous cholecystectomy. Common bile duct: Diameter: 3.7 mm. Liver: No focal lesion  identified. Within normal limits in parenchymal echogenicity. Portal vein is patent on color Doppler imaging with normal direction of blood flow towards the liver. IMPRESSION: Previous cholecystectomy, otherwise unremarkable right upper quadrant ultrasound. Electronically Signed   By: Elberta Fortisaniel  Boyle M.D.   On: 08/04/2018 12:04    Procedures Procedures (including critical care time)  Medications Ordered in ED Medications  lactated ringers bolus 1,000 mL (0 mLs Intravenous Stopped 08/04/18 0838)  capsaicin (ZOSTRIX) 0.025 % cream ( Topical Given 08/04/18 1016)  metoCLOPramide (REGLAN) injection 10 mg (10 mg Intravenous Given 08/04/18 0759)  diphenhydrAMINE (BENADRYL) injection 25 mg (25 mg Intravenous Given 08/04/18 0759)  potassium chloride SA (K-DUR,KLOR-CON) CR tablet 40 mEq (40 mEq Oral Given 08/04/18 1036)  lactated ringers bolus 1,000 mL (0 mLs Intravenous Stopped 08/04/18 1147)  morphine 4 MG/ML injection 6 mg (6 mg Intravenous Given 08/04/18 1143)  iopamidol (ISOVUE-300) 61 % injection 100 mL (100 mLs Intravenous Contrast Given 08/04/18 1203)  iopamidol (ISOVUE-300) 61 % injection (  Contrast Given 08/04/18 1237)     Initial Impression / Assessment and Plan / ED Course  I have reviewed the triage vital signs and the nursing notes.  Pertinent labs & imaging results that were available during my care of the patient were reviewed by me and considered in my medical decision making (see chart for details).  Clinical Course as of Aug 04 1344  Sat Aug 04, 2018  [id number] Improving.  WBC(!): 12.0 [AM]  1006 Reassessed.  Patient reports that he had interim improvement of his abdominal pain, however it returned.   [AM]  1035 Will reassess with CT chest/abd/pelvis.  DG Abdomen Acute W/Chest [AM]  1053 Noted. Pt is on his 3rd liter of fluid.   Lactic Acid, Venous(!!): 3.80 [AM]  1222 No evidence of CBD dilatation.   US Abdomen Limited RUQ [AM]    Clinical Course User Index [AM] Elisha PonderMurray,  Letzy Gullickson B, PA-C    Patient is overall uncomfortable appearing, but hemodynamically stable.  No fevers.  He has nonsurgical abdomen.  Official diagnosis includes perforated viscus, gastritis, cannabis hyperemesis with his recurrent chronic abdominal pain.  Patient is presented multiple times for this, with this being the third visit in 3 days.  It appears he is failing outpatient management.  As a secondary issue today, on patient's acute abdomen with chest film, he had evidence of  a worsening mediastinal mass.  This was previously first characterized in July 2019, but he has not had any follow-up for this.  CT scan today is demonstrating evidence of anterior mediastinal mass, differential diagnosis including thymoma, lymphoma, germoid tumor, less likely metastatic disease per radiologist.   We will seek admission for failure of outpatient therapy, recurrent and intractable nausea and vomiting, and work-up of anterior mediastinal mass.  Spoke with Dr. Letta Pate of [facility] hospitalist who states that he will have patient for observation.  Would like to touch base with CT surgery to see if patient would need transfer to [facility] for any management of his anterior mediastinal mass.  Appreciate his involvement in the care of this patient.  CT surgery discussed patient case with admitting physician.  Appreciate their involvement in the care of this patient.  Final Clinical Impressions(s) / ED Diagnoses   Final diagnoses:  RUQ pain  Intractable nausea and vomiting  Generalized abdominal pain  Mediastinal mass    ED Discharge Orders    None       Delia Chimes 08/04/18 1502    Margarita Grizzle, MD 08/10/18 2356

## 2018-08-04 NOTE — ED Notes (Signed)
Bed: [id number] Expected date:  Expected time:  Means of arrival:  Comments: EMS [age]'s male abd pain and cramping-IV Zofran and NS

## 2018-08-04 NOTE — H&P (Signed)
History and Physical    Matthew Scott OZD:[id number] DOB: 04-Sep-1980 DOA: 08/04/2018  PCP: Hoy Register, MD   Patient coming from: Home  Chief Complaint: Nausea Vomiting, Abdominal pain  HPI: Matthew Scott is a 37 y.o. male with medical history significant of biliary dyskinesia status post cholecystectomy, chronic abdominal pain, cyclic vomiting syndrome, history of gastritis and H. pylori infection, history of substance abuse,, GERD, history of gastroparesis, history of hyperbilirubinemia and other comorbidities who presents again with intractable nausea vomiting abdominal pain.  He states the nausea and vomiting associated with abdominal pain began 3 days ago.  Initially started with nausea and progressed to the vomiting and to the abdominal pain.  Patient states that persistently he is unable to tolerate p.o. however today after given antiemetics he is able to tolerate some water.  He states that he has developed some sharp chest pain as well from dry heaving.  Patient describes abdominal pain in the right upper quadrant.  He states that laying down worsens it but standing up and hot water helps his symptoms.  Patient states that soft surfaces makes abdominal pain worse and flat hard surfaces makes it better.  Patient also states that he has not had a bowel movement 5 days and is used to going every day.  He will be admitted for intractable nausea vomiting abdominal pain as he was unable to tolerate p.o. this is his third ED visit in the last few days.  [facility] was called to admit this patient for intractable nausea, vomiting and abdominal pain likely in the setting of hyperemesis cannabis syndrome versus gastroparesis.  ED Course: The ED patient was given 2 L of lactated Ringer boluses, metoclopramide, capsaicin, p.o. potassium chloride.  He was also given Benadryl and IV morphine 6 mg for his abdominal pain.  He was made n.p.o. patient also had a CT of the abdomen and pelvis with contrast along  with CT chest.  The CT of the chest revealed a incidental heterogeneous lobulated anterior mediastinal mass that is 4.1 x 5.9 x 7.7 cm.  Review of Systems: As per HPI otherwise 10 point review of systems negative.   Past Medical History:  Diagnosis Date  . 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 Chronic abdominal pain   . Cyclical vomiting syndrome   . GASTRITIS 12/09/2009   Qualifier: Diagnosis of  By: Daphine Deutscher FNP, Zena Amos    . Gastroparesis   . 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 Nausea and vomiting    chronic, recurrent  . Polysubstance abuse [facility])     Past Surgical History:  Procedure Laterality Date  . CHOLECYSTECTOMY  11/2009   SOCIAL HISTORY  reports that he has been smoking cigars. He has smoked for the past 8.00 years. He has never used smokeless tobacco. He reports current drug use. Drug: Marijuana. He reports that he does not drink alcohol.  ALLERGIES No Known Allergies  Family History  Problem Relation Age of Onset  . Other Father   . Diabetes Father   . Hypertension Father    Prior to Admission medications   Medication Sig Start Date End Date Taking? Authorizing Provider  metoCLOPramide (REGLAN) 10 MG tablet Take 1 tablet (10 mg total) by mouth every 6 (six) hours. 08/02/18  Yes Cheron Schaumann K, PA-C  ondansetron (ZOFRAN ODT) 4 MG disintegrating tablet Take 1 tablet (4 mg total) by  mouth every 8 (eight) hours as needed for nausea or vomiting. 08/01/18  Yes Harlene Salts A, PA-C  capsaicin (ZOSTRIX) 0.025 % cream Apply topically 2 (two) times daily. Apply to your abdomen 2-3 times a day if you are having problems with nausea, vomiting and pain.  Also take a dose of Reglan and Benadryl as prescribed. Patient not taking: Reported on 08/01/2018 02/21/18   Arby Barrette, MD  Capsaicin-Menthol-Methyl Sal (CAPSAICIN-METHYL SAL-MENTHOL) 0.025-1-12 % CREA Apply 1 inch  topically 2 (two) times daily. Patient not taking: Reported on 08/01/2018 02/19/18   Kellie Shropshire, PA-C  dicyclomine (BENTYL) 20 MG tablet Take 1 tablet (20 mg total) by mouth 2 (two) times daily. Patient not taking: Reported on 08/01/2018 11/04/17   Aviva Kluver B, PA-C  diphenhydrAMINE (BENADRYL) 25 MG tablet Take 1 tablet (25 mg total) by mouth every 6 (six) hours as needed. Take with Reglan to try to control nausea and vomiting. Patient not taking: Reported on 08/01/2018 02/21/18   Arby Barrette, MD  lidocaine (XYLOCAINE) 2 % solution Use as directed 15 mLs every 3 (three) hours as needed in the mouth or throat for mouth pain. Patient not taking: Reported on 08/01/2018 07/01/17   McDonald, Mia A, PA-C  lisinopril (PRINIVIL,ZESTRIL) 5 MG tablet Take 1 tablet (5 mg total) by mouth daily. Patient not taking: Reported on 08/01/2018 08/10/16   Hoy Register, MD  omeprazole (PRILOSEC) 20 MG capsule Take 1 capsule (20 mg total) by mouth daily. Patient not taking: Reported on 08/01/2018 02/21/18   Arby Barrette, MD  pantoprazole (PROTONIX) 40 MG tablet Take 1 tablet (40 mg total) by mouth daily. Patient not taking: Reported on 08/01/2018 03/13/18   Vivianne Master, PA-C  potassium chloride SA (K-DUR,KLOR-CON) 20 MEQ tablet Take 1 tablet (20 mEq total) by mouth daily for 3 doses. Patient not taking: Reported on 05/02/2018 11/04/17 05/02/18  Elisha Ponder, PA-C   Physical Exam: Vitals:   08/04/18 1145 08/04/18 1145 08/04/18 1430 08/04/18 1512  BP: (!) 150/76 138/76 131/79 135/79  Pulse: 63 67 (!) 58 (!) 58  Resp: 19 17  16   Temp:  99.3 F (37.4 C)    TempSrc:  Oral    SpO2: 97% 97% 98% 98%  Weight:      Height:       Constitutional: WN/WD AAM in NAD and appears calm  But uncomfortable Eyes: Lids and conjunctivae normal, sclerae anicteric  ENMT: External Ears, Nose appear normal. Grossly normal hearing.  Neck: Appears normal, supple, no cervical masses, normal ROM, no appreciable  thyromegaly; no JVD Respiratory: Diminished to auscultation bilaterally, no wheezing, rales, rhonchi or crackles. Normal respiratory effort and patient is not tachypenic. No accessory muscle use. Unlabored Breathing  Cardiovascular: Slightly Bradycardic but regular rhythm, no murmurs / rubs / gallops. S1 and S2 auscultated. No extremity edema.   Abdomen: Soft, Tender to palpate in the RUQ, non-distended. No masses palpated. No appreciable hepatosplenomegaly. Bowel sounds positive x4.  GU: Deferred. Musculoskeletal: No clubbing / cyanosis of digits/nails. No joint deformity upper and lower extremities.  Skin: No rashes, lesions, ulcers on a limited skin evaluation. No induration; Warm and dry.  Neurologic: CN 2-12 grossly intact with no focal deficits. Romberg sign and cerebellar reflexes not assessed.  Psychiatric: Normal judgment and insight. Alert and oriented x 3. Normal mood and appropriate affect.   Labs on Admission: I have personally reviewed following labs and imaging studies  CBC: Recent Labs  Lab 08/01/18 1526 08/02/18 0952 08/04/18 4540  WBC 7.9 16.9* 12.0*  NEUTROABS  --  14.4*  --   HGB 12.5* 12.5* 12.5*  HCT 39.3 38.5* 37.6*  MCV 99.0 99.7 96.2  PLT 233 238 249   Basic Metabolic Panel: Recent Labs  Lab 08/01/18 1526 08/02/18 0952 08/04/18 0651  NA 142 142 139  K 3.5 3.5 3.3*  CL 108 106 98  CO2 28 22 23   GLUCOSE 94 169* 115*  BUN 11 10 16   CREATININE 0.99 1.05 1.12  CALCIUM 9.4 9.5 9.4  MG  --   --  2.0   GFR: Estimated Creatinine Clearance: 96.2 mL/min (by C-G formula based on SCr of 1.12 mg/dL). Liver Function Tests: Recent Labs  Lab 08/01/18 1526 08/02/18 0952 08/04/18 0651  AST 20 35 93*  ALT 15 19 35  ALKPHOS 54 60 53  BILITOT 1.7* 2.1* 3.4*  PROT 7.1 7.7 7.0  ALBUMIN 4.3 4.9 4.5   Recent Labs  Lab 08/01/18 1526 08/02/18 0952 08/04/18 0651  LIPASE 22 20 21    No results for input(s): AMMONIA in the last 168 hours. Coagulation  Profile: No results for input(s): INR, PROTIME in the last 168 hours. Cardiac Enzymes: No results for input(s): CKTOTAL, CKMB, CKMBINDEX, TROPONINI in the last 168 hours. BNP (last 3 results) No results for input(s): PROBNP in the last 8760 hours. HbA1C: No results for input(s): HGBA1C in the last 72 hours. CBG: No results for input(s): GLUCAP in the last 168 hours. Lipid Profile: No results for input(s): CHOL, HDL, LDLCALC, TRIG, CHOLHDL, LDLDIRECT in the last 72 hours. Thyroid Function Tests: No results for input(s): TSH, T4TOTAL, FREET4, T3FREE, THYROIDAB in the last 72 hours. Anemia Panel: No results for input(s): VITAMINB12, FOLATE, FERRITIN, TIBC, IRON, RETICCTPCT in the last 72 hours. Urine analysis:    Component Value Date/Time   COLORURINE YELLOW 08/04/2018 0935   APPEARANCEUR CLEAR 08/04/2018 0935   LABSPEC 1.028 08/04/2018 0935   PHURINE 6.0 08/04/2018 0935   GLUCOSEU NEGATIVE 08/04/2018 0935   HGBUR SMALL (A) 08/04/2018 0935   BILIRUBINUR NEGATIVE 08/04/2018 0935   KETONESUR 80 (A) 08/04/2018 0935   PROTEINUR 30 (A) 08/04/2018 0935   UROBILINOGEN 1.0 04/27/2015 1206   NITRITE NEGATIVE 08/04/2018 0935   LEUKOCYTESUR NEGATIVE 08/04/2018 0935   Sepsis Labs: !!!!!!!!!!!!!!!!!!!!!!!!!!!!!!!!!!!!!!!!!!!! @LABRCNTIP (procalcitonin:4,lacticidven:4) )No results found for this or any previous visit (from the past 240 hour(s)).   Radiological Exams on Admission: Ct Chest W Contrast  Result Date: 08/04/2018 CLINICAL DATA:  Abdominal pain with nausea and vomiting as well as fever 3 days. EXAM: CT CHEST, ABDOMEN, AND PELVIS WITH CONTRAST TECHNIQUE: Multidetector CT imaging of the chest, abdomen and pelvis was performed following the standard protocol during bolus administration of intravenous contrast. CONTRAST:  ISOVUE-300 IOPAMIDOL (ISOVUE-300) INJECTION 61% COMPARISON:  Abdominopelvic CT 08/01/2018, 11/04/2017 and 02/19/2016 FINDINGS: CT CHEST FINDINGS Cardiovascular:  Heart is normal size. Thoracic aorta and pulmonary arterial system are within normal. Remaining vascular structures are unremarkable. Mediastinum/Nodes: There is a heterogeneous enhancing lobulated anterior mediastinal mass measuring approximately 4.1 x 5.9 x 7.7 cm in AP, transverse and craniocaudal dimensions. No hilar or mediastinal adenopathy. Remaining mediastinal structures are unremarkable. Lungs/Pleura: Lungs are well inflated without focal airspace consolidation or effusion. Airways are normal. Musculoskeletal: Normal. CT ABDOMEN PELVIS FINDINGS Hepatobiliary: There are a few small subcentimeter liver hypodensities too small to characterize but likely cysts. Previous cholecystectomy. Biliary tree is unremarkable. Pancreas: Normal. Spleen: Normal. Adrenals/Urinary Tract: Adrenal glands are normal. Kidneys normal in size without hydronephrosis. There are 2-3 small  right renal stones with the largest measuring 3 mm over the upper pole. Ureters and bladder are normal. Stomach/Bowel: Stomach and small bowel are normal. Appendix is not visualized. Colon is within normal. Vascular/Lymphatic: Vascular structures are within normal. No adenopathy. Reproductive: Normal. Other: No free fluid or focal inflammatory change. Findings suggesting prior umbilical hernia repair. Musculoskeletal: Unremarkable. IMPRESSION: No acute findings in the chest, abdomen or pelvis. Heterogeneous lobulated anterior mediastinal mass measuring 4.1 x 5.9 x 7.7 cm. Differential diagnosis considerations include thymic neoplasm, germ-cell tumor, lymphoma and less likely metastatic disease. Recommend thoracic surgery consultation and consider PET-CT for further evaluation. Few subcentimeter liver hypodensities too small to characterize but likely cysts. Right-sided nephrolithiasis with the largest stone over the upper pole measuring 3 mm. Suggestion previous umbilical hernia repair. These results were called by telephone at the time of  interpretation on 08/04/2018 at 1:17 p.m. to Dr. Aviva Kluver , who verbally acknowledged these results. Electronically Signed   By: Elberta Fortis M.D.   On: 08/04/2018 13:19   Ct Abdomen Pelvis W Contrast  Result Date: 08/04/2018 CLINICAL DATA:  Abdominal pain with nausea and vomiting as well as fever 3 days. EXAM: CT CHEST, ABDOMEN, AND PELVIS WITH CONTRAST TECHNIQUE: Multidetector CT imaging of the chest, abdomen and pelvis was performed following the standard protocol during bolus administration of intravenous contrast. CONTRAST:  ISOVUE-300 IOPAMIDOL (ISOVUE-300) INJECTION 61% COMPARISON:  Abdominopelvic CT 08/01/2018, 11/04/2017 and 02/19/2016 FINDINGS: CT CHEST FINDINGS Cardiovascular: Heart is normal size. Thoracic aorta and pulmonary arterial system are within normal. Remaining vascular structures are unremarkable. Mediastinum/Nodes: There is a heterogeneous enhancing lobulated anterior mediastinal mass measuring approximately 4.1 x 5.9 x 7.7 cm in AP, transverse and craniocaudal dimensions. No hilar or mediastinal adenopathy. Remaining mediastinal structures are unremarkable. Lungs/Pleura: Lungs are well inflated without focal airspace consolidation or effusion. Airways are normal. Musculoskeletal: Normal. CT ABDOMEN PELVIS FINDINGS Hepatobiliary: There are a few small subcentimeter liver hypodensities too small to characterize but likely cysts. Previous cholecystectomy. Biliary tree is unremarkable. Pancreas: Normal. Spleen: Normal. Adrenals/Urinary Tract: Adrenal glands are normal. Kidneys normal in size without hydronephrosis. There are 2-3 small right renal stones with the largest measuring 3 mm over the upper pole. Ureters and bladder are normal. Stomach/Bowel: Stomach and small bowel are normal. Appendix is not visualized. Colon is within normal. Vascular/Lymphatic: Vascular structures are within normal. No adenopathy. Reproductive: Normal. Other: No free fluid or focal inflammatory  change. Findings suggesting prior umbilical hernia repair. Musculoskeletal: Unremarkable. IMPRESSION: No acute findings in the chest, abdomen or pelvis. Heterogeneous lobulated anterior mediastinal mass measuring 4.1 x 5.9 x 7.7 cm. Differential diagnosis considerations include thymic neoplasm, germ-cell tumor, lymphoma and less likely metastatic disease. Recommend thoracic surgery consultation and consider PET-CT for further evaluation. Few subcentimeter liver hypodensities too small to characterize but likely cysts. Right-sided nephrolithiasis with the largest stone over the upper pole measuring 3 mm. Suggestion previous umbilical hernia repair. These results were called by telephone at the time of interpretation on 08/04/2018 at 1:17 p.m. to Dr. Aviva Kluver , who verbally acknowledged these results. Electronically Signed   By: Elberta Fortis M.D.   On: 08/04/2018 13:19   Dg Abdomen Acute W/chest  Result Date: 08/04/2018 CLINICAL DATA:  Abdominal pain and vomiting 1 week. EXAM: DG ABDOMEN ACUTE W/ 1V CHEST COMPARISON:  Chest x-ray 02/26/2018, 02/02/2017 as well as chest x-ray 12/13/2015 FINDINGS: Lungs are adequately inflated and otherwise clear. Cardiac silhouette is normal. Worsening soft tissue prominence over the left hilum/main pulmonary  artery segment. Remainder of the chest is unchanged. Abdominopelvic images demonstrate surgical clips over the right upper quadrant. Bowel gas pattern is nonobstructive. Remaining bones and soft tissues are normal. IMPRESSION: Nonobstructive bowel gas pattern. No acute cardiopulmonary disease. Soft tissue prominence of the left hilum/main pulmonary artery segment. Could not exclude adenopathy or mass. Recommend contrast-enhanced chest CT for further evaluation. Electronically Signed   By: Elberta Fortis M.D.   On: 08/04/2018 08:33   US Abdomen Limited Ruq  Result Date: 08/04/2018 CLINICAL DATA:  Right upper quadrant pain.  Cholecystectomy. EXAM: ULTRASOUND ABDOMEN  LIMITED RIGHT UPPER QUADRANT COMPARISON:  CT 08/01/2018 FINDINGS: Gallbladder: Previous cholecystectomy. Common bile duct: Diameter: 3.7 mm. Liver: No focal lesion identified. Within normal limits in parenchymal echogenicity. Portal vein is patent on color Doppler imaging with normal direction of blood flow towards the liver. IMPRESSION: Previous cholecystectomy, otherwise unremarkable right upper quadrant ultrasound. Electronically Signed   By: Elberta Fortis M.D.   On: 08/04/2018 12:04   EKG: Independently reviewed.  Very poor tracing but did show normal sinus rhythm rate of 65.  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   Cyclic vomiting syndrome   Nausea & vomiting   Severe tetrahydrocannabinol (THC) dependence (HCC)   Elevated bilirubin   Hyperbilirubinemia   GERD (gastroesophageal reflux disease)   Essential hypertension   Uncontrollable vomiting   Anemia   Intractable nausea and vomiting   Elevated AST (SGOT)  Intractable nausea, vomiting abdominal pain -Unclear etiology but in the setting of hyperemesis cannabis syndrome versus gastroparesis versus other etiology including acute gastroenteritis -Supportive care and continue IV fluid maintenance -Continue antiemetics -Clear liquid diet and advance diet as tolerated -Continue with monitoring patient's abdominal pain and continue with pain control -Counseling given to the patient to stop cannabis; his UDS positive for cannabis and THC -If not improving consider GI consultation -Continue with metoclopramide, capsaicin cream, Bentyl, and Protonix -Pain was on the right side and CT of the abdomen and pelvis showed few subcentimeter liver hypodensities that are too small to characterize but likely liver cysts and also showed right-sided nephrolith lithiasis with large stone over the upper pole of the kidney measuring 3 mm -Right upper quadrant  ultrasound showed previous cholecystectomy otherwise unremarkable right upper quadrant ultrasound -Check HbA1c to r/o Diabetes causing Gastroparesis  -Patient denies using any EtOH  Hypokalemia -Patient potassium was 3.3 -Replete with p.o. potassium chloride -Continue monitor replete as necessary -Repeat CMP in a.m.  Accelerated hypertension -Patient blood pressure on admission was 182/122 and then improved to 130/76 -Resume home lisinopril 5 mg p.o. daily  Lactic Aacidosis -Likely in setting of nausea vomiting dehydration -Patient's lactic acid level trended down from 3.18 and now 1.57 -Continue with IV fluid hydration  Leukocytosis -Likely reactive and trending down from last hospital visit from 16.9 is now 12.0 -To monitor for signs and symptoms of infection -Repeat CBC in the a.m.  Mediastinal Mass -CT of the chest showed a heterogeneous lobulated anterior mediastinal mass that was measuring 4.1 x 5.9 and 7.7 cm-differential diagnosis include thymic neoplasm, germ cell tumor, lymphoma and is likely metastatic disease -I discussed the case with Dr. Tyrone Sage and cardiothoracic surgery who recommends outpatient PET scan and follow-up with outpatient setting if patient has no myasthenic symptoms -Patient does not have any myasthenic symptoms currently and I also discussed the case with Dr. Clelia Croft who recommends cupping Dr. Shirline Frees the discharge summary so he can  follow-up and set up an appointment with him for this PET scan  Normocytic anemia -Patient's hemoglobin/hematocrit has been stable last few times and is 12.5/37.6 on admission currently -Check anemia panel in a.m. -Continue monitor for signs and symptoms of bleeding  Hyperbilirubinemia -Has been trending up since the beginning of the month -Unclear etiology -RUQ U/S Negative -CT Abdomen and Pelvis showed few subcentimeter liver hypodensities are too small to characterize but likely cysts -Continue monitor trend and  continue with IV fluid hydration -Repeat C MP in a.m.  Abnormal AST -AST was elevated at 93 -Right upper quadrant ultrasound did not show any etiology -We will check acute hepatitis panel -Repeat CMP and trend liver functions in AM  Cannabis Abuse/ Hx of Polysubstance Abuse -UDS was positive for THC -Counseling given  Essential HTN -C/w Home Lisiniopril   DVT prophylaxis: Heparin 5000 units subcu every 8 Code Status: FULL CODE Family Communication: No family present at bedside Disposition Plan: Anticipate DC in the next 24-48 hours if Nausea and Vomiting has resolved Consults called: Discussed Case with Cardiothoracic Surgery Dr. Tyrone Sage and with Oncology Dr. Clelia Croft Admission status: Obs Med-Surge  Severity of Illness: The appropriate patient status for this patient is OBSERVATION. Observation status is judged to be reasonable and necessary in order to provide the required intensity of service to ensure the patient's safety. The patient's presenting symptoms, physical exam findings, and initial radiographic and laboratory data in the context of their medical condition is felt to place them at decreased risk for further clinical deterioration. Furthermore, it is anticipated that the patient will be medically stable for discharge from the hospital within 2 midnights of admission. The following factors support the patient status of observation.   " The patient's presenting symptoms include, vomiting, abdominal pain. " The physical exam findings include right upper quadrant tenderness. " The initial radiographic and laboratory data are certainly for a mediastinal mass and hyperbilirubinemia.  Merlene Laughter, D.O. [facility] PAGER is on AMION  If 7PM-7AM, please contact night-coverage [email or web address] Password [facility]  08/04/2018, 4:45 PM

## 2018-08-04 NOTE — ED Triage Notes (Signed)
PT BIB EMS FROM HOME C/O MID-ABDOMINAL PAIN WITH N/V X3 DAYS. PER EMS, PT WAS SEEN PREVIOUSLY FOR SAME AND GIVEN RX W/O RELIEF. ZOFRAN 4MG  IV AND NS 600ML BOLUS PTA.

## 2018-08-04 NOTE — ED Notes (Signed)
Lab reports instrument difficulty which delayed Mg and Troponin. Should be resulted 09:45.

## 2018-08-04 NOTE — Plan of Care (Signed)
  Problem: Education: Goal: Knowledge of medication regimen will be met for pain relief regimen by discharge Outcome: Progressing   Problem: Coping: Goal: Ability to verbalize feelings will improve by discharge Outcome: Progressing Goal: Family members realistic understanding of the patients condition will improve by discharge Outcome: Progressing   Problem: Fluid Volume: Goal: Maintenance of adequate hydration will improve by discharge Outcome: Progressing   Problem: Respiratory: Goal: Ability to maintain adequate oxygenation and ventilation will improve by discharge Outcome: Progressing   Problem: Pain Management: Goal: Satisfaction with pain management regimen will be met by discharge Outcome: Progressing

## 2018-08-04 NOTE — ED Notes (Signed)
ED TO INPATIENT HANDOFF REPORT  Name/Age/Gender Matthew Scott 37 y.o. male  Code Status Code Status History    Date Active Date Inactive Code Status Order ID Comments User Context   03/10/2016 1038 03/11/2016 1422 Full Code [id number]  Jonah BlueYates, Jennifer, MD Inpatient   01/15/2015 1603 01/18/2015 1535 Full Code [id number]  Cathren Harshai, Ripudeep K, MD Inpatient   12/14/2014 1650 12/16/2014 2041 Full Code [id number]  Hollice EspyKrishnan, Sendil K, MD Inpatient   12/05/2014 1750 12/08/2014 1633 Full Code [id number]  Rama, Maryruth Bunhristina P, MD Inpatient   05/11/2012 2030 05/12/2012 1636 Full Code [id number]  Sonnie AlamoSauve, Lauren L, RN Inpatient   05/03/2012 2310 05/06/2012 1731 Full Code [id number]  Eulogio DitchZhu, Jennifer S, RN Inpatient      Home/SNF/Other Home  Chief Complaint abdominal pain  Level of Care/Admitting Diagnosis ED Disposition    ED Disposition Condition Comment   Admit  [facility] [[id number]]  Level of Care: Med-Surg [16]  Diagnosis: Intractable nausea and vomiting [720114]  Admitting Physician: Marguerita MerlesSHEIKH, OMAIR LATIF [[id number]]  Attending Physician: Marguerita MerlesSHEIKH, OMAIR LATIF [[id number]]  PT Class (Do Not Modify): Observation [104]  PT Acc Code (Do Not Modify): Observation [10022]       Medical History Past Medical History:  Diagnosis Date  . 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 Chronic abdominal pain   . Cyclical vomiting syndrome   . GASTRITIS 12/09/2009   Qualifier: Diagnosis of  By: Daphine DeutscherMartin FNP, Zena AmosNykedtra    . Gastroparesis   . HELICOBACTER PYLORI INFECTION 12/14/2009   Qualifier: Diagnosis of  By: Levon Hedgerraddock, Brenda    . Nausea and vomiting    chronic, recurrent  . Polysubstance abuse (HCC)     Allergies No Known Allergies  IV Location/Drains/Wounds Patient Lines/Drains/Airways Status   Active Line/Drains/Airways    Name:   Placement date:   Placement time:   Site:   Days:   Peripheral IV 08/04/18 Left Antecubital   08/04/18    -     Antecubital   less than 1          Labs/Imaging Results for orders placed or performed during the hospital encounter of 08/04/18 (from the past 48 hour(s))  Lipase, blood     Status: None   Collection Time: 08/04/18  6:51 AM  Result Value Ref Range   Lipase 21 11 - 51 U/L    Comment: Performed at [facility], [address] [place], [place] [address]  Comprehensive metabolic panel     Status: Abnormal   Collection Time: 08/04/18  6:51 AM  Result Value Ref Range   Sodium 139 135 - 145 mmol/L   Potassium 3.3 (L) 3.5 - 5.1 mmol/L   Chloride 98 98 - 111 mmol/L   CO2 23 22 - 32 mmol/L   Glucose, Bld 115 (H) 70 - 99 mg/dL   BUN 16 6 - 20 mg/dL   Creatinine, Ser 8.751.12 0.61 - 1.24 mg/dL   Calcium 9.4 8.9 - 64.310.3 mg/dL   Total Protein 7.0 6.5 - 8.1 g/dL   Albumin 4.5 3.5 - 5.0 g/dL   AST 93 (H) 15 - 41 U/L   ALT 35 0 - 44 U/L   Alkaline Phosphatase 53 38 - 126 U/L   Total Bilirubin 3.4 (H) 0.3 - 1.2 mg/dL   GFR calc non Af Amer >60 >60 mL/min   GFR calc Af Amer >60 >60 mL/min   Anion gap 18 (  H) 5 - 15    Comment: Performed at [facility], [address], [place], [place] [address]  CBC     Status: Abnormal   Collection Time: 08/04/18  6:51 AM  Result Value Ref Range   WBC 12.0 (H) 4.0 - 10.5 K/uL   RBC 3.91 (L) 4.22 - 5.81 MIL/uL   Hemoglobin 12.5 (L) 13.0 - 17.0 g/dL   HCT 60.4 (L) 54.0 - 98.1 %   MCV 96.2 80.0 - 100.0 fL   MCH 32.0 26.0 - 34.0 pg   MCHC 33.2 30.0 - 36.0 g/dL   RDW 19.1 47.8 - 29.5 %   Platelets 249 150 - 400 K/uL   nRBC 0.0 0.0 - 0.2 %    Comment: Performed at [facility], [address] [place], [place] [address]  Magnesium     Status: None   Collection Time: 08/04/18  6:51 AM  Result Value Ref Range   Magnesium 2.0 1.7 - 2.4 mg/dL    Comment: Performed at [facility], [address] [place], [place] [address]  Urinalysis, Routine w reflex microscopic     Status: Abnormal    Collection Time: 08/04/18  9:35 AM  Result Value Ref Range   Color, Urine YELLOW YELLOW   APPearance CLEAR CLEAR   Specific Gravity, Urine 1.028 1.005 - 1.030   pH 6.0 5.0 - 8.0   Glucose, UA NEGATIVE NEGATIVE mg/dL   Hgb urine dipstick SMALL (A) NEGATIVE   Bilirubin Urine NEGATIVE NEGATIVE   Ketones, ur 80 (A) NEGATIVE mg/dL   Protein, ur 30 (A) NEGATIVE mg/dL   Nitrite NEGATIVE NEGATIVE   Leukocytes, UA NEGATIVE NEGATIVE   RBC / HPF 11-20 0 - 5 RBC/hpf   WBC, UA 0-5 0 - 5 WBC/hpf   Bacteria, UA NONE SEEN NONE SEEN   Mucus PRESENT     Comment: Performed at [facility], [address], [place], [place] [address]  I-Stat Troponin, ED (not at [facility])     Status: None   Collection Time: 08/04/18  9:44 AM  Result Value Ref Range   Troponin i, poc 0.01 0.00 - 0.08 ng/mL   Comment 3            Comment: Due to the release kinetics of cTnI, a negative result within the first hours of the onset of symptoms does not rule out myocardial infarction with certainty. If myocardial infarction is still suspected, repeat the test at appropriate intervals.   I-Stat CG4 Lactic Acid, ED     Status: Abnormal   Collection Time: 08/04/18 10:48 AM  Result Value Ref Range   Lactic Acid, Venous 3.80 (HH) 0.5 - 1.9 mmol/L   Comment NOTIFIED PHYSICIAN   I-Stat CG4 Lactic Acid, ED     Status: None   Collection Time: 08/04/18  1:00 PM  Result Value Ref Range   Lactic Acid, Venous 1.57 0.5 - 1.9 mmol/L   Ct Chest W Contrast  Result Date: 08/04/2018 CLINICAL DATA:  Abdominal pain with nausea and vomiting as well as fever 3 days. EXAM: CT CHEST, ABDOMEN, AND PELVIS WITH CONTRAST TECHNIQUE: Multidetector CT imaging of the chest, abdomen and pelvis was performed following the standard protocol during bolus administration of intravenous contrast. CONTRAST:  ISOVUE-300 IOPAMIDOL (ISOVUE-300) INJECTION 61% COMPARISON:  Abdominopelvic CT 08/01/2018, 11/04/2017 and 02/19/2016 FINDINGS: CT  CHEST FINDINGS Cardiovascular: Heart is normal size. Thoracic aorta and pulmonary arterial system are within normal. Remaining vascular structures  are unremarkable. Mediastinum/Nodes: There is a heterogeneous enhancing lobulated anterior mediastinal mass measuring approximately 4.1 x 5.9 x 7.7 cm in AP, transverse and craniocaudal dimensions. No hilar or mediastinal adenopathy. Remaining mediastinal structures are unremarkable. Lungs/Pleura: Lungs are well inflated without focal airspace consolidation or effusion. Airways are normal. Musculoskeletal: Normal. CT ABDOMEN PELVIS FINDINGS Hepatobiliary: There are a few small subcentimeter liver hypodensities too small to characterize but likely cysts. Previous cholecystectomy. Biliary tree is unremarkable. Pancreas: Normal. Spleen: Normal. Adrenals/Urinary Tract: Adrenal glands are normal. Kidneys normal in size without hydronephrosis. There are 2-3 small right renal stones with the largest measuring 3 mm over the upper pole. Ureters and bladder are normal. Stomach/Bowel: Stomach and small bowel are normal. Appendix is not visualized. Colon is within normal. Vascular/Lymphatic: Vascular structures are within normal. No adenopathy. Reproductive: Normal. Other: No free fluid or focal inflammatory change. Findings suggesting prior umbilical hernia repair. Musculoskeletal: Unremarkable. IMPRESSION: No acute findings in the chest, abdomen or pelvis. Heterogeneous lobulated anterior mediastinal mass measuring 4.1 x 5.9 x 7.7 cm. Differential diagnosis considerations include thymic neoplasm, germ-cell tumor, lymphoma and less likely metastatic disease. Recommend thoracic surgery consultation and consider PET-CT for further evaluation. Few subcentimeter liver hypodensities too small to characterize but likely cysts. Right-sided nephrolithiasis with the largest stone over the upper pole measuring 3 mm. Suggestion previous umbilical hernia repair. These results were called by  telephone at the time of interpretation on 08/04/2018 at 1:17 p.m. to Dr. Aviva Kluver , who verbally acknowledged these results. Electronically Signed   By: Elberta Fortis M.D.   On: 08/04/2018 13:19   Ct Abdomen Pelvis W Contrast  Result Date: 08/04/2018 CLINICAL DATA:  Abdominal pain with nausea and vomiting as well as fever 3 days. EXAM: CT CHEST, ABDOMEN, AND PELVIS WITH CONTRAST TECHNIQUE: Multidetector CT imaging of the chest, abdomen and pelvis was performed following the standard protocol during bolus administration of intravenous contrast. CONTRAST:  ISOVUE-300 IOPAMIDOL (ISOVUE-300) INJECTION 61% COMPARISON:  Abdominopelvic CT 08/01/2018, 11/04/2017 and 02/19/2016 FINDINGS: CT CHEST FINDINGS Cardiovascular: Heart is normal size. Thoracic aorta and pulmonary arterial system are within normal. Remaining vascular structures are unremarkable. Mediastinum/Nodes: There is a heterogeneous enhancing lobulated anterior mediastinal mass measuring approximately 4.1 x 5.9 x 7.7 cm in AP, transverse and craniocaudal dimensions. No hilar or mediastinal adenopathy. Remaining mediastinal structures are unremarkable. Lungs/Pleura: Lungs are well inflated without focal airspace consolidation or effusion. Airways are normal. Musculoskeletal: Normal. CT ABDOMEN PELVIS FINDINGS Hepatobiliary: There are a few small subcentimeter liver hypodensities too small to characterize but likely cysts. Previous cholecystectomy. Biliary tree is unremarkable. Pancreas: Normal. Spleen: Normal. Adrenals/Urinary Tract: Adrenal glands are normal. Kidneys normal in size without hydronephrosis. There are 2-3 small right renal stones with the largest measuring 3 mm over the upper pole. Ureters and bladder are normal. Stomach/Bowel: Stomach and small bowel are normal. Appendix is not visualized. Colon is within normal. Vascular/Lymphatic: Vascular structures are within normal. No adenopathy. Reproductive: Normal. Other: No free fluid or  focal inflammatory change. Findings suggesting prior umbilical hernia repair. Musculoskeletal: Unremarkable. IMPRESSION: No acute findings in the chest, abdomen or pelvis. Heterogeneous lobulated anterior mediastinal mass measuring 4.1 x 5.9 x 7.7 cm. Differential diagnosis considerations include thymic neoplasm, germ-cell tumor, lymphoma and less likely metastatic disease. Recommend thoracic surgery consultation and consider PET-CT for further evaluation. Few subcentimeter liver hypodensities too small to characterize but likely cysts. Right-sided nephrolithiasis with the largest stone over the upper pole measuring 3 mm. Suggestion previous umbilical hernia repair. These  results were called by telephone at the time of interpretation on 08/04/2018 at 1:17 p.m. to Dr. Aviva Kluver , who verbally acknowledged these results. Electronically Signed   By: Elberta Fortis M.D.   On: 08/04/2018 13:19   Dg Abdomen Acute W/chest  Result Date: 08/04/2018 CLINICAL DATA:  Abdominal pain and vomiting 1 week. EXAM: DG ABDOMEN ACUTE W/ 1V CHEST COMPARISON:  Chest x-ray 02/26/2018, 02/02/2017 as well as chest x-ray 12/13/2015 FINDINGS: Lungs are adequately inflated and otherwise clear. Cardiac silhouette is normal. Worsening soft tissue prominence over the left hilum/main pulmonary artery segment. Remainder of the chest is unchanged. Abdominopelvic images demonstrate surgical clips over the right upper quadrant. Bowel gas pattern is nonobstructive. Remaining bones and soft tissues are normal. IMPRESSION: Nonobstructive bowel gas pattern. No acute cardiopulmonary disease. Soft tissue prominence of the left hilum/main pulmonary artery segment. Could not exclude adenopathy or mass. Recommend contrast-enhanced chest CT for further evaluation. Electronically Signed   By: Elberta Fortis M.D.   On: 08/04/2018 08:33   US Abdomen Limited Ruq  Result Date: 08/04/2018 CLINICAL DATA:  Right upper quadrant pain.  Cholecystectomy. EXAM:  ULTRASOUND ABDOMEN LIMITED RIGHT UPPER QUADRANT COMPARISON:  CT 08/01/2018 FINDINGS: Gallbladder: Previous cholecystectomy. Common bile duct: Diameter: 3.7 mm. Liver: No focal lesion identified. Within normal limits in parenchymal echogenicity. Portal vein is patent on color Doppler imaging with normal direction of blood flow towards the liver. IMPRESSION: Previous cholecystectomy, otherwise unremarkable right upper quadrant ultrasound. Electronically Signed   By: Elberta Fortis M.D.   On: 08/04/2018 12:04   EKG Interpretation  Date/Time:  Saturday August 04 2018 07:24:02 EST Ventricular Rate:  65 PR Interval:    QRS Duration: 100 QT Interval:  379 QTC Calculation: 394 R Axis:   46 Text Interpretation:  Normal sinus rhythm Poor data quality Confirmed by Margarita Grizzle [phone]) on 08/04/2018 10:29:59 AM   Pending Labs Unresulted Labs (From admission, onward)    Start     Ordered   Signed and Held  HIV antibody (Routine Testing)  Once,   R     Signed and Held   Signed and Held  Creatinine, serum  (heparin)  Once,   R    Comments:  Baseline for heparin therapy IF NOT ALREADY DRAWN.    Signed and Held   Signed and Held  Comprehensive metabolic panel  Once,   R     Signed and Held   Signed and Held  TSH  Once,   R     Signed and Held   Signed and Held  Hemoglobin A1c  Tomorrow morning,   R     Signed and Held          Vitals/Pain Today's Vitals   08/04/18 1235 08/04/18 1430 08/04/18 1500 08/04/18 1512  BP:  131/79  135/79  Pulse:  (!) 58  (!) 58  Resp:    16  Temp:      TempSrc:      SpO2:  98%  98%  Weight:      Height:      PainSc: 0-No pain  3      Isolation Precautions No active isolations  Medications Medications  lactated ringers bolus 1,000 mL (0 mLs Intravenous Stopped 08/04/18 0838)  capsaicin (ZOSTRIX) 0.025 % cream ( Topical Given 08/04/18 1016)  metoCLOPramide (REGLAN) injection 10 mg (10 mg Intravenous Given 08/04/18 0759)  diphenhydrAMINE (BENADRYL)  injection 25 mg (25 mg Intravenous Given 08/04/18 0759)  potassium chloride SA (K-DUR,KLOR-CON)  CR tablet 40 mEq (40 mEq Oral Given 08/04/18 1036)  lactated ringers bolus 1,000 mL (0 mLs Intravenous Stopped 08/04/18 1147)  morphine 4 MG/ML injection 6 mg (6 mg Intravenous Given 08/04/18 1143)  iopamidol (ISOVUE-300) 61 % injection 100 mL (100 mLs Intravenous Contrast Given 08/04/18 1203)  iopamidol (ISOVUE-300) 61 % injection (  Contrast Given 08/04/18 1237)    Mobility walks

## 2018-08-04 NOTE — Progress Notes (Signed)
New Admission Note:    Arrival Method: Stretcher from ED Mental Orientation: A&O x 4 Assessment: WNL Skin: WNL IV: WNL Pain: 4/10 Safety Measures: Siderails up x 4, call bell in reach Admission: In progress 5E Orientation:Completed Family:  At bedside - Girlfriend  Orders have been reviewed and implemented.  Will continue to monitor the patient.

## 2018-08-05 LAB — CBC WITH DIFFERENTIAL/PLATELET
Abs Immature Granulocytes: 0.03 10*3/uL (ref 0.00–0.07)
Basophils Absolute: 0 10*3/uL (ref 0.0–0.1)
Basophils Relative: 0 %
Eosinophils Absolute: 0 10*3/uL (ref 0.0–0.5)
Eosinophils Relative: 0 %
HCT: 38.6 % — ABNORMAL LOW (ref 39.0–52.0)
Hemoglobin: 12.6 g/dL — ABNORMAL LOW (ref 13.0–17.0)
Immature Granulocytes: 0 %
Lymphocytes Relative: 36 %
Lymphs Abs: 3.2 10*3/uL (ref 0.7–4.0)
MCH: 31.5 pg (ref 26.0–34.0)
MCHC: 32.6 g/dL (ref 30.0–36.0)
MCV: 96.5 fL (ref 80.0–100.0)
Monocytes Absolute: 1 10*3/uL (ref 0.1–1.0)
Monocytes Relative: 11 %
Neutro Abs: 4.7 10*3/uL (ref 1.7–7.7)
Neutrophils Relative %: 53 %
Platelets: 239 10*3/uL (ref 150–400)
RBC: 4 MIL/uL — ABNORMAL LOW (ref 4.22–5.81)
RDW: 11.7 % (ref 11.5–15.5)
WBC: 9 10*3/uL (ref 4.0–10.5)
nRBC: 0 % (ref 0.0–0.2)

## 2018-08-05 LAB — COMPREHENSIVE METABOLIC PANEL
ALT: 46 U/L — ABNORMAL HIGH (ref 0–44)
AST: 116 U/L — ABNORMAL HIGH (ref 15–41)
Albumin: 4.1 g/dL (ref 3.5–5.0)
Alkaline Phosphatase: 47 U/L (ref 38–126)
Anion gap: 13 (ref 5–15)
BUN: 9 mg/dL (ref 6–20)
CO2: 25 mmol/L (ref 22–32)
Calcium: 9.2 mg/dL (ref 8.9–10.3)
Chloride: 99 mmol/L (ref 98–111)
Creatinine, Ser: 1 mg/dL (ref 0.61–1.24)
GFR calc Af Amer: >60 mL/min (ref >60)
GFR calc non Af Amer: >60 mL/min (ref >60)
Glucose, Bld: 110 mg/dL — ABNORMAL HIGH (ref 70–99)
Potassium: 3.5 mmol/L (ref 3.5–5.1)
Sodium: 137 mmol/L (ref 135–145)
Total Bilirubin: 3.7 mg/dL — ABNORMAL HIGH (ref 0.3–1.2)
Total Protein: 6.7 g/dL (ref 6.5–8.1)

## 2018-08-05 LAB — MAGNESIUM: Magnesium: 2 mg/dL (ref 1.7–2.4)

## 2018-08-05 LAB — GLUCOSE, CAPILLARY: Glucose-Capillary: 113 mg/dL — ABNORMAL HIGH (ref 70–99)

## 2018-08-05 LAB — HIV ANTIBODY (ROUTINE TESTING W REFLEX): HIV Screen 4th Generation wRfx: NONREACTIVE

## 2018-08-05 LAB — PHOSPHORUS: Phosphorus: 2.8 mg/dL (ref 2.5–4.6)

## 2018-08-05 LAB — HEMOGLOBIN A1C
Hgb A1c MFr Bld: 4.3 % — ABNORMAL LOW (ref 4.8–5.6)
Mean Plasma Glucose: 76.71 mg/dL

## 2018-08-05 MED ORDER — FLEET ENEMA 7-19 GM/118ML RE ENEM: 1.0000 | ENEMA | Freq: Every day | RECTAL | Status: DC | PRN

## 2018-08-05 NOTE — Progress Notes (Addendum)
Pt. Removed his iv and SCD ,was in bath room through  up.This RN advised pt. to call for assistance.   Pt. educated diconnect and connect iv line may expose pt to  infection and sepsis.Pt .respond understanding.

## 2018-08-05 NOTE — Progress Notes (Signed)
PROGRESS NOTE        PATIENT DETAILS Name: Matthew Scott Age: 37 y.o. Sex: male Date of Birth: 10/18/1980 Admit Date: 08/04/2018 Admitting Physician Merlene Laughtermair Latif Sheikh, DO ZOX:[id number]:Newlin, Odette HornsEnobong, MD  Brief Narrative: Patient is a [age] y.o. male with longstanding history of marijuana use, numerous ED visits over the past years for recurrent abdominal pain/nausea vomiting-actively nausea/vomiting and abdominal pain.  Admitted for further evaluation and treatment.  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  He has had numerous ED visits for similar complaints.  CT abdomen without any acute findings, lipase within normal limits.  Abdominal exam this morning is benign.  Continue supportive care-with as needed antiemetics-slowly advance diet.  Counseled extensively regarding importance of abstaining from further marijuana use-but he does not seem to be motivated.  Mediastinal mass: Seen incidentally on CT chest.  Admitting MD spoke with [facility] (Dr. Tyrone SageGerhardt) and oncology-they will arrange for outpatient PET scan and further work-up.  Minimally elevated total bilirubin levels: CT imaging without any major hepatobiliary abnormalities-patient is status post cholecystectomy.  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  Mildly elevated transaminases: Appears to be in a pattern of alcoholic hepatitis-patient denies any significant alcohol use.  Downtrending LFTs-continue supportive care.  Hypertension: Controlled-continue lisinopril  Marijuana use: Counseled-see above  DVT Prophylaxis: Prophylactic Heparin  Code Status: Full code   Family Communication: None at bedside  Disposition Plan: Remain inpatient-Home hopefully in the next 1-2  days.  Antimicrobial agents: Anti-infectives (From admission, onward)   None      Procedures: None  CONSULTS:  None  Time spent: 25- minutes-Greater than 50% of this time was spent in counseling, explanation of diagnosis, planning of further management, and coordination of care.  MEDICATIONS: Scheduled Meds: . capsaicin  1 g Topical BID  . dicyclomine  20 mg Oral BID  . heparin  5,000 Units Subcutaneous Q8H  . lisinopril  5 mg Oral Daily  . metoCLOPramide  10 mg Oral Q6H  . pantoprazole  40 mg Oral Daily  . polyethylene glycol  17 g Oral BID  . potassium chloride SA  20 mEq Oral Daily  . senna-docusate  1 tablet Oral BID   Continuous Infusions: . 0.9 % NaCl with KCl 40 mEq / L 100 mL/hr (08/05/18 0456)   PRN Meds:.acetaminophen **OR** acetaminophen, bisacodyl, HYDROcodone-acetaminophen, lidocaine, ondansetron **OR** ondansetron (ZOFRAN) IV, sodium phosphate   PHYSICAL EXAM: Vital signs: Vitals:   08/04/18 1746 08/04/18 1958 08/05/18 0459 08/05/18 0845  BP: 133/72 133/81 (!) 147/80 138/80  Pulse: 62 (!) 57 65   Resp: 16 17 18    Temp: 98.5 F (36.9 C) 98.4 F (36.9 C) 98.4 F (36.9 C)   TempSrc: Oral Oral Oral   SpO2: 100% 100% 100%   Weight:   81.1 kg   Height:       Filed Weights   08/04/18 0640 08/05/18 0459  Weight: 86 kg 81.1 kg   Body mass index is 26.42 kg/m.   General appearance :Awake, alert, not in any distress.  Eyes:Pink conjunctiva HEENT: Atraumatic and Normocephalic Neck: supple Resp:Good air entry bilaterally, no added sounds  CVS: S1 S2 regular, no murmurs.  GI: Bowel sounds present, mildly  tender upper abdominal area without any peritoneal signs. Extremities: B/L Lower Ext shows no edema, both legs are warm to touch Neurology:  speech clear,Non focal, sensation is grossly intact. Psychiatric: Normal judgment and insight. Alert and oriented x 3. Normal mood. Musculoskeletal:No digital cyanosis Skin:No Rash, warm and  dry Wounds:N/A  I have personally reviewed following labs and imaging studies  LABORATORY DATA: CBC: Recent Labs  Lab 08/01/18 1526 08/02/18 0952 08/04/18 0651 08/05/18 0603  WBC 7.9 16.9* 12.0* 9.0  NEUTROABS  --  14.4*  --  4.7  HGB 12.5* 12.5* 12.5* 12.6*  HCT 39.3 38.5* 37.6* 38.6*  MCV 99.0 99.7 96.2 96.5  PLT 233 238 249 239    Basic Metabolic Panel: Recent Labs  Lab 08/01/18 1526 08/02/18 0952 08/04/18 0651 08/04/18 1843 08/05/18 0603  NA 142 142 139 137 137  K 3.5 3.5 3.3* 3.3* 3.5  CL 108 106 98 98 99  CO2 28 22 23 28 25   GLUCOSE 94 169* 115* 98 110*  BUN 11 10 16 12 9   CREATININE 0.99 1.05 1.12 0.96 1.00  CALCIUM 9.4 9.5 9.4 9.2 9.2  MG  --   --  2.0  --  2.0  PHOS  --   --   --   --  2.8    GFR: Estimated Creatinine Clearance: 99.2 mL/min (by C-G formula based on SCr of 1 mg/dL).  Liver Function Tests: Recent Labs  Lab 08/01/18 1526 08/02/18 0952 08/04/18 0651 08/04/18 1843 08/05/18 0603  AST 20 35 93* 105* 116*  ALT 15 19 35 40 46*  ALKPHOS 54 60 53 47 47  BILITOT 1.7* 2.1* 3.4* 3.2* 3.7*  PROT 7.1 7.7 7.0 6.5 6.7  ALBUMIN 4.3 4.9 4.5 4.2 4.1   Recent Labs  Lab 08/01/18 1526 08/02/18 0952 08/04/18 0651  LIPASE 22 20 21    No results for input(s): AMMONIA in the last 168 hours.  Coagulation Profile: No results for input(s): INR, PROTIME in the last 168 hours.  Cardiac Enzymes: No results for input(s): CKTOTAL, CKMB, CKMBINDEX, TROPONINI in the last 168 hours.  BNP (last 3 results) No results for input(s): PROBNP in the last 8760 hours.  HbA1C: Recent Labs    08/05/18 0603  HGBA1C 4.3*    CBG: Recent Labs  Lab 08/05/18 0718  GLUCAP 113*    Lipid Profile: No results for input(s): CHOL, HDL, LDLCALC, TRIG, CHOLHDL, LDLDIRECT in the last 72 hours.  Thyroid Function Tests: Recent Labs    08/04/18 1843  TSH 1.037    Anemia Panel: No results for input(s): VITAMINB12, FOLATE, FERRITIN, TIBC, IRON, RETICCTPCT in  the last 72 hours.  Urine analysis:    Component Value Date/Time   COLORURINE YELLOW 08/04/2018 0935   APPEARANCEUR CLEAR 08/04/2018 0935   LABSPEC 1.028 08/04/2018 0935   PHURINE 6.0 08/04/2018 0935   GLUCOSEU NEGATIVE 08/04/2018 0935   HGBUR SMALL (A) 08/04/2018 0935   BILIRUBINUR NEGATIVE 08/04/2018 0935   KETONESUR 80 (A) 08/04/2018 0935   PROTEINUR 30 (A) 08/04/2018 0935   UROBILINOGEN 1.0 04/27/2015 1206   NITRITE NEGATIVE 08/04/2018 0935   LEUKOCYTESUR NEGATIVE 08/04/2018 0935    Sepsis Labs: Lactic Acid, Venous    Component Value Date/Time   LATICACIDVEN 1.57 08/04/2018 1300    MICROBIOLOGY: No results found for this or any previous visit (from the past 240 hour(s)).  RADIOLOGY STUDIES/RESULTS: Ct Chest W Contrast  Result Date: 08/04/2018 CLINICAL DATA:  Abdominal pain with nausea and vomiting as well as fever  3 days. EXAM: CT CHEST, ABDOMEN, AND PELVIS WITH CONTRAST TECHNIQUE: Multidetector CT imaging of the chest, abdomen and pelvis was performed following the standard protocol during bolus administration of intravenous contrast. CONTRAST:  100mL ISOVUE-300 IOPAMIDOL (ISOVUE-300) INJECTION 61% COMPARISON:  Abdominopelvic CT 08/01/2018, 11/04/2017 and 02/19/2016 FINDINGS: CT CHEST FINDINGS Cardiovascular: Heart is normal size. Thoracic aorta and pulmonary arterial system are within normal. Remaining vascular structures are unremarkable. Mediastinum/Nodes: There is a heterogeneous enhancing lobulated anterior mediastinal mass measuring approximately 4.1 x 5.9 x 7.7 cm in AP, transverse and craniocaudal dimensions. No hilar or mediastinal adenopathy. Remaining mediastinal structures are unremarkable. Lungs/Pleura: Lungs are well inflated without focal airspace consolidation or effusion. Airways are normal. Musculoskeletal: Normal. CT ABDOMEN PELVIS FINDINGS Hepatobiliary: There are a few small subcentimeter liver hypodensities too small to characterize but likely cysts.  Previous cholecystectomy. Biliary tree is unremarkable. Pancreas: Normal. Spleen: Normal. Adrenals/Urinary Tract: Adrenal glands are normal. Kidneys normal in size without hydronephrosis. There are 2-3 small right renal stones with the largest measuring 3 mm over the upper pole. Ureters and bladder are normal. Stomach/Bowel: Stomach and small bowel are normal. Appendix is not visualized. Colon is within normal. Vascular/Lymphatic: Vascular structures are within normal. No adenopathy. Reproductive: Normal. Other: No free fluid or focal inflammatory change. Findings suggesting prior umbilical hernia repair. Musculoskeletal: Unremarkable. IMPRESSION: No acute findings in the chest, abdomen or pelvis. Heterogeneous lobulated anterior mediastinal mass measuring 4.1 x 5.9 x 7.7 cm. Differential diagnosis considerations include thymic neoplasm, germ-cell tumor, lymphoma and less likely metastatic disease. Recommend thoracic surgery consultation and consider PET-CT for further evaluation. Few subcentimeter liver hypodensities too small to characterize but likely cysts. Right-sided nephrolithiasis with the largest stone over the upper pole measuring 3 mm. Suggestion previous umbilical hernia repair. These results were called by telephone at the time of interpretation on 08/04/2018 at 1:17 p.m. to Dr. Aviva KluverALYSSA MURRAY , who verbally acknowledged these results. Electronically Signed   By: Elberta Fortisaniel  Boyle M.D.   On: 08/04/2018 13:19   Ct Abdomen Pelvis W Contrast  Result Date: 08/04/2018 CLINICAL DATA:  Abdominal pain with nausea and vomiting as well as fever 3 days. EXAM: CT CHEST, ABDOMEN, AND PELVIS WITH CONTRAST TECHNIQUE: Multidetector CT imaging of the chest, abdomen and pelvis was performed following the standard protocol during bolus administration of intravenous contrast. CONTRAST:  100mL ISOVUE-300 IOPAMIDOL (ISOVUE-300) INJECTION 61% COMPARISON:  Abdominopelvic CT 08/01/2018, 11/04/2017 and 02/19/2016 FINDINGS: CT  CHEST FINDINGS Cardiovascular: Heart is normal size. Thoracic aorta and pulmonary arterial system are within normal. Remaining vascular structures are unremarkable. Mediastinum/Nodes: There is a heterogeneous enhancing lobulated anterior mediastinal mass measuring approximately 4.1 x 5.9 x 7.7 cm in AP, transverse and craniocaudal dimensions. No hilar or mediastinal adenopathy. Remaining mediastinal structures are unremarkable. Lungs/Pleura: Lungs are well inflated without focal airspace consolidation or effusion. Airways are normal. Musculoskeletal: Normal. CT ABDOMEN PELVIS FINDINGS Hepatobiliary: There are a few small subcentimeter liver hypodensities too small to characterize but likely cysts. Previous cholecystectomy. Biliary tree is unremarkable. Pancreas: Normal. Spleen: Normal. Adrenals/Urinary Tract: Adrenal glands are normal. Kidneys normal in size without hydronephrosis. There are 2-3 small right renal stones with the largest measuring 3 mm over the upper pole. Ureters and bladder are normal. Stomach/Bowel: Stomach and small bowel are normal. Appendix is not visualized. Colon is within normal. Vascular/Lymphatic: Vascular structures are within normal. No adenopathy. Reproductive: Normal. Other: No free fluid or focal inflammatory change. Findings suggesting prior umbilical hernia repair. Musculoskeletal: Unremarkable. IMPRESSION: No acute findings in the chest,  abdomen or pelvis. Heterogeneous lobulated anterior mediastinal mass measuring 4.1 x 5.9 x 7.7 cm. Differential diagnosis considerations include thymic neoplasm, germ-cell tumor, lymphoma and less likely metastatic disease. Recommend thoracic surgery consultation and consider PET-CT for further evaluation. Few subcentimeter liver hypodensities too small to characterize but likely cysts. Right-sided nephrolithiasis with the largest stone over the upper pole measuring 3 mm. Suggestion previous umbilical hernia repair. These results were called by  telephone at the time of interpretation on 08/04/2018 at 1:17 p.m. to Dr. Aviva Kluver , who verbally acknowledged these results. Electronically Signed   By: Elberta Fortis M.D.   On: 08/04/2018 13:19   Ct Abdomen Pelvis W Contrast  Result Date: 08/01/2018 CLINICAL DATA:  37 year old with acute abdominal pain and fever. EXAM: CT ABDOMEN AND PELVIS WITH CONTRAST TECHNIQUE: Multidetector CT imaging of the abdomen and pelvis was performed using the standard protocol following bolus administration of intravenous contrast. CONTRAST:  ISOVUE-300 IOPAMIDOL (ISOVUE-300) INJECTION 61% COMPARISON:  11/04/2017 FINDINGS: Lower chest: Lung bases are clear. Hepatobiliary: Punctate low-density structure near the hepatic dome is unchanged and could represent a small cyst. Gallbladder has been removed. Portal venous system is patent. No biliary dilatation. Pancreas: Unremarkable. No pancreatic ductal dilatation or surrounding inflammatory changes. Spleen: Normal in size without focal abnormality. Adrenals/Urinary Tract: Normal adrenal glands. Normal appearance of the urinary bladder. Punctate low-density in the right kidney probably represents a cyst and similar to the previous examination. Nonobstructive 5 mm stone in the right kidney upper pole. No evidence for left renal calculi. Stomach/Bowel: Normal appearance of the stomach. No evidence for bowel dilatation. There may be a slightly thickened loop of colon in the left lower quadrant possibly representing the proximal sigmoid colon. There are no significant inflammatory changes around this mildly thickened loop of bowel. Vascular/Lymphatic: No significant vascular findings are present. No enlarged abdominal or pelvic lymph nodes. Reproductive: Prostate is unremarkable. Other: Trace free fluid in the pelvis. Densities along the anterior abdominal wall compatible with prior surgery. Musculoskeletal: Small lucencies in the pelvic bones have not significantly changed  since 12/09/2015. No acute bone abnormality. IMPRESSION: 1. Trace free fluid in the pelvis could represent an underlying inflammatory process. Question a mildly thickened loop of bowel in the left lower quadrant which may represent large bowel. Small focus of enteritis or colitis cannot be excluded. 2. Nonobstructive right kidney stone. Electronically Signed   By: Richarda Overlie M.D.   On: 08/01/2018 19:59   Dg Abdomen Acute W/chest  Result Date: 08/04/2018 CLINICAL DATA:  Abdominal pain and vomiting 1 week. EXAM: DG ABDOMEN ACUTE W/ 1V CHEST COMPARISON:  Chest x-ray 02/26/2018, 02/02/2017 as well as chest x-ray 12/13/2015 FINDINGS: Lungs are adequately inflated and otherwise clear. Cardiac silhouette is normal. Worsening soft tissue prominence over the left hilum/main pulmonary artery segment. Remainder of the chest is unchanged. Abdominopelvic images demonstrate surgical clips over the right upper quadrant. Bowel gas pattern is nonobstructive. Remaining bones and soft tissues are normal. IMPRESSION: Nonobstructive bowel gas pattern. No acute cardiopulmonary disease. Soft tissue prominence of the left hilum/main pulmonary artery segment. Could not exclude adenopathy or mass. Recommend contrast-enhanced chest CT for further evaluation. Electronically Signed   By: Elberta Fortis M.D.   On: 08/04/2018 08:33   US Abdomen Limited Ruq  Result Date: 08/04/2018 CLINICAL DATA:  Right upper quadrant pain.  Cholecystectomy. EXAM: ULTRASOUND ABDOMEN LIMITED RIGHT UPPER QUADRANT COMPARISON:  CT 08/01/2018 FINDINGS: Gallbladder: Previous cholecystectomy. Common bile duct: Diameter: 3.7 mm. Liver: No focal lesion identified.  Within normal limits in parenchymal echogenicity. Portal vein is patent on color Doppler imaging with normal direction of blood flow towards the liver. IMPRESSION: Previous cholecystectomy, otherwise unremarkable right upper quadrant ultrasound. Electronically Signed   By: Elberta Fortis M.D.   On:  08/04/2018 12:04     LOS: 0 days   Jeoffrey Massed, MD  [facility]  If 7PM-7AM, please contact night-coverage  Please page via [email or web address] [email or web address]-click on MD name and type text message  08/05/2018, 11:27 AM

## 2018-08-06 DIAGNOSIS — J9859 Other diseases of mediastinum, not elsewhere classified: Secondary | ICD-10-CM | POA: Diagnosis present

## 2018-08-06 DIAGNOSIS — F12188 Cannabis abuse with other cannabis-induced disorder: Secondary | ICD-10-CM | POA: Diagnosis present

## 2018-08-06 LAB — COMPREHENSIVE METABOLIC PANEL
ALT: 39 U/L (ref 0–44)
AST: 68 U/L — ABNORMAL HIGH (ref 15–41)
Albumin: 3.6 g/dL (ref 3.5–5.0)
Alkaline Phosphatase: 42 U/L (ref 38–126)
Anion gap: 9 (ref 5–15)
BUN: 6 mg/dL (ref 6–20)
CO2: 26 mmol/L (ref 22–32)
Calcium: 8.8 mg/dL — ABNORMAL LOW (ref 8.9–10.3)
Chloride: 102 mmol/L (ref 98–111)
Creatinine, Ser: 0.87 mg/dL (ref 0.61–1.24)
GFR calc Af Amer: >60 mL/min (ref >60)
GFR calc non Af Amer: >60 mL/min (ref >60)
Glucose, Bld: 94 mg/dL (ref 70–99)
Potassium: 3.6 mmol/L (ref 3.5–5.1)
Sodium: 137 mmol/L (ref 135–145)
Total Bilirubin: 2.6 mg/dL — ABNORMAL HIGH (ref 0.3–1.2)
Total Protein: 5.7 g/dL — ABNORMAL LOW (ref 6.5–8.1)

## 2018-08-06 LAB — HEPATITIS PANEL, ACUTE
HCV Ab: <0.1 s/co ratio (ref 0.0–0.9)
Hep A IgM: NEGATIVE
Hep B C IgM: NEGATIVE
Hepatitis B Surface Ag: NEGATIVE

## 2018-08-06 LAB — MAGNESIUM: Magnesium: 2.1 mg/dL (ref 1.7–2.4)

## 2018-08-06 LAB — GLUCOSE, CAPILLARY: Glucose-Capillary: 100 mg/dL — ABNORMAL HIGH (ref 70–99)

## 2018-08-06 MED ORDER — SENNOSIDES-DOCUSATE SODIUM 8.6-50 MG PO TABS: 2.0000 | ORAL_TABLET | Freq: Every day | ORAL | 2 refills | Status: DC

## 2018-08-06 MED ORDER — ONDANSETRON HCL 4 MG PO TABS: 4.0000 mg | ORAL_TABLET | Freq: Four times a day (QID) | ORAL | 0 refills | Status: DC | PRN

## 2018-08-06 MED ORDER — OMEPRAZOLE 20 MG PO CPDR: 20.0000 mg | DELAYED_RELEASE_CAPSULE | Freq: Every day | ORAL | 1 refills | Status: DC

## 2018-08-06 MED ORDER — ACETAMINOPHEN 325 MG PO TABS: 650.0000 mg | ORAL_TABLET | Freq: Four times a day (QID) | ORAL | 0 refills | Status: DC | PRN

## 2018-08-06 NOTE — Progress Notes (Signed)
Pauline AusWillie E Hoskie to be D/C'd Home per MD order.  Discussed prescriptions and follow up appointments with the patient. Prescriptions given to patient, medication list explained in detail. Pt verbalized understanding.  Allergies as of 08/06/2018   No Known Allergies     Medication List    STOP taking these medications   dicyclomine 20 MG tablet Commonly known as:  BENTYL   diphenhydrAMINE 25 MG tablet Commonly known as:  BENADRYL   lisinopril 5 MG tablet Commonly known as:  PRINIVIL,ZESTRIL   metoCLOPramide 10 MG tablet Commonly known as:  REGLAN   ondansetron 4 MG disintegrating tablet Commonly known as:  ZOFRAN ODT   pantoprazole 40 MG tablet Commonly known as:  PROTONIX   potassium chloride SA 20 MEQ tablet Commonly known as:  K-DUR,KLOR-CON     TAKE these medications   acetaminophen 325 MG tablet Commonly known as:  TYLENOL Take 2 tablets (650 mg total) by mouth every 6 (six) hours as needed for mild pain, fever or headache (or Fever >/= 101).   capsaicin 0.025 % cream Commonly known as:  ZOSTRIX Apply topically 2 (two) times daily. Apply to your abdomen 2-3 times a day if you are having problems with nausea, vomiting and pain.  Also take a dose of Reglan and Benadryl as prescribed.   Capsaicin-Menthol-Methyl Sal 0.025-1-12 % Crea Commonly known as:  capsaicin-methyl sal-menthol Apply 1 inch topically 2 (two) times daily.   lidocaine 2 % solution Commonly known as:  XYLOCAINE Use as directed 15 mLs every 3 (three) hours as needed in the mouth or throat for mouth pain.   omeprazole 20 MG capsule Commonly known as:  PRILOSEC Take 1 capsule (20 mg total) by mouth daily.   ondansetron 4 MG tablet Commonly known as:  ZOFRAN Take 1 tablet (4 mg total) by mouth every 6 (six) hours as needed for nausea or vomiting.   senna-docusate 8.6-50 MG tablet Commonly known as:  Senokot-S Take 2 tablets by mouth at bedtime.       Vitals:   08/05/18 2124 08/06/18 0544   BP: (!) 145/81 135/82  Pulse: 69 66  Resp: 19 19  Temp: 98.4 F (36.9 C) 98.4 F (36.9 C)  SpO2: 100% 99%    Skin clean, dry and intact without evidence of skin break down, no evidence of skin tears noted. IV catheter discontinued intact. Site without signs and symptoms of complications. Dressing and pressure applied. Pt denies pain at this time. No complaints noted.  An After Visit Summary was printed and given to the patient. Patient escorted via WC, and D/C home via private auto.  Viviana SimplerDumas, Braylin Xu S 08/06/2018 1:46 PM

## 2018-08-06 NOTE — Discharge Summary (Signed)
Matthew Scott, is a 37 y.o. male  DOB Aug 07, 1981  MRN [id number].  Admission date:  08/04/2018  Admitting Physician  Merlene Laughter, DO  Discharge Date:  08/06/2018   Primary MD  Hoy Register, MD  Recommendations for primary care physician for things to follow:   1)You have cannabis induced vomiting /Cannabis/Marijuana induced Cyclic vomiting syndrome)---also called Cannabis/marijuana hyperemesis syndrome concurrent with and due to cannabis abuse ----your vomiting will stop if you quit marijuana/cannabis  2)Check your blood pressure at least 3 times a week,  keep a diary of your blood pressure and follow-up with your primary care physician for reevaluation you may need to start amlodipine 2.5 mg daily for blood pressure if your blood pressure stays elevated  3)Low-salt diet advised  4)You have a Mediastinal Mass--- Heterogeneous lobulated anterior mediastinal mass measuring 4.1 x 5.9 x 7.7 cm......--- spot in your chest, You need to follow-up with pulmonologist as an outpatient within the next month and get a PET scan and further work-up to make sure this is not cancer related.... Avoid tobacco exposure  Admission Diagnosis  Mediastinal mass [J98.59] Generalized abdominal pain [R10.84] RUQ pain [R10.11] Intractable nausea and vomiting [R11.2]   Discharge Diagnosis  Mediastinal mass [J98.59] Generalized abdominal pain [R10.84] RUQ pain [R10.11] Intractable nausea and vomiting [R11.2]    Principal Problem:   Intractable vomiting secondary to cannabis hyperemesis syndrome Active Problems:   Cannabis induced Cyclic vomiting syndrome   Severe tetrahydrocannabinol (THC) dependence (HCC)   Cannabis hyperemesis syndrome concurrent with and due to cannabis abuse (HCC)   Abdominal pain   Gastroparesis   Nausea & vomiting   Elevated bilirubin   Hyperbilirubinemia   GERD (gastroesophageal reflux  disease)   Essential hypertension   Uncontrollable vomiting   Anemia   Intractable nausea and vomiting   Elevated AST (SGOT)   Mediastinal mass      Past Medical History:  Diagnosis Date  . 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 Chronic abdominal pain   . Cyclical vomiting syndrome   . GASTRITIS 12/09/2009   Qualifier: Diagnosis of  By: Daphine Deutscher FNP, Zena Amos    . Gastroparesis   . 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 Nausea and vomiting    chronic, recurrent  . Polysubstance abuse [facility])     Past Surgical History:  Procedure Laterality Date  . CHOLECYSTECTOMY  11/2009       HPI  from the history and physical done on the day of admission:    Chief Complaint: Nausea Vomiting, Abdominal pain  HPI: Matthew Scott is a 37 y.o. male with medical history significant of biliary dyskinesia status post cholecystectomy, chronic abdominal pain, cyclic vomiting syndrome, history of gastritis and H. pylori infection, history of substance abuse,, GERD, history of gastroparesis, history of hyperbilirubinemia and other comorbidities who presents again with intractable nausea vomiting abdominal pain.  He states the nausea and vomiting  associated with abdominal pain began 3 days ago.  Initially started with nausea and progressed to the vomiting and to the abdominal pain.  Patient states that persistently he is unable to tolerate p.o. however today after given antiemetics he is able to tolerate some water.  He states that he has developed some sharp chest pain as well from dry heaving.  Patient describes abdominal pain in the right upper quadrant.  He states that laying down worsens it but standing up and hot water helps his symptoms.  Patient states that soft surfaces makes abdominal pain worse and flat hard surfaces makes it better.  Patient also states that he has not had a bowel movement 5 days and  is used to going every day.  He will be admitted for intractable nausea vomiting abdominal pain as he was unable to tolerate p.o. this is his third ED visit in the last few days.  [facility] was called to admit this patient for intractable nausea, vomiting and abdominal pain likely in the setting of hyperemesis cannabis syndrome versus gastroparesis.  ED Course: The ED patient was given 2 L of lactated Ringer boluses, metoclopramide, capsaicin, p.o. potassium chloride.  He was also given Benadryl and IV morphine 6 mg for his abdominal pain.  He was made n.p.o. patient also had a CT of the abdomen and pelvis with contrast along with CT chest.  The CT of the chest revealed a incidental heterogeneous lobulated anterior mediastinal mass that is 4.1 x 5.9 x 7.7 cm.   Hospital Course:   1. Intractable nausea and vomiting   2. RUQ pain   3. Generalized abdominal pain   4. 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 male with longstanding history of marijuana use, numerous ED visits over the past years for recurrent abdominal pain/nausea vomiting-actively nausea/vomiting and abdominal pain.  Admitted for further evaluation and treatment.  Assessment/Plan: Intractable nausea with vomiting and abdominal pain/Cannabis hyperemesis syndrome concurrent with and due to cannabis abuse:  has cyclical vomiting syndrome secondary to marijuana use.  He has had numerous ED visits for similar complaints.  CT abdomen without any acute findings, lipase within normal limits.  Abdominal exam remains benign.   .  Counseled extensively regarding importance of abstaining from further marijuana use-but he does not seem  to be motivated to quit at this time  Mediastinal mass: Seen incidentally on CT chest.  Admitting MD spoke with [facility] (Dr. Tyrone SageGerhardt) and oncology-they will arrange for outpatient PET scan and further work-up.  Need for outpatient follow-up emphasized to patient and his significant other at bedside, they verbalized understanding  Minimally elevated total bilirubin levels: CT imaging without any major hepatobiliary abnormalities-patient is status post cholecystectomy.  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  Mildly elevated transaminases: Appears to be in a pattern of alcoholic hepatitis-patient denies any significant alcohol use.  Downtrending LFTs-continue supportive care, patient admits to above average alcohol use  Marijuana use: Counseled-see above   Code Status: Full code   Family Communication: Significant other at bedside, questions answered  Discharge Condition: stable  Follow UP--outpatient follow-up with pulmonology for mediastinal mass evaluation  Diet and Activity recommendation:  As advised  Discharge Instructions    Discharge Instructions  Call MD for:  difficulty breathing, headache or visual disturbances   Complete by:  As directed    Call MD for:  persistant dizziness or light-headedness   Complete by:  As directed    Call MD for:  persistant nausea and vomiting   Complete by:  As directed    Call MD for:  severe uncontrolled pain   Complete by:  As directed    Call MD for:  temperature >100.4   Complete by:  As directed    Diet - low sodium heart healthy   Complete by:  As directed    Discharge instructions   Complete by:  As directed    1)You have cannabis induced vomiting (Cannabis/Marijuana induced Cyclic vomiting syndrome)---also called Cannabis/marijuana hyperemesis syndrome concurrent with and due to cannabis abuse ----your vomiting will stop if you quit marijuana/cannabis  2)Check your blood pressure at least 3 times a week,  keep a diary of  your blood pressure and follow-up with your primary care physician for reevaluation you may need to start amlodipine 2.5 mg daily for blood pressure if your blood pressure stays elevated  3)Low-salt diet advised  4)You have a Mediastinal Mass--- Heterogeneous lobulated anterior mediastinal mass measuring 4.1 x 5.9 x 7.7 cm......--- spot in your chest, You need to follow-up with pulmonologist as an outpatient within the next month and get a PET scan and further work-up to make sure this is not cancer related.... Avoid tobacco exposure   Increase activity slowly   Complete by:  As directed         Discharge Medications     Allergies as of 08/06/2018   No Known Allergies     Medication List    STOP taking these medications   dicyclomine 20 MG tablet Commonly known as:  BENTYL   diphenhydrAMINE 25 MG tablet Commonly known as:  BENADRYL   lisinopril 5 MG tablet Commonly known as:  PRINIVIL,ZESTRIL   metoCLOPramide 10 MG tablet Commonly known as:  REGLAN   ondansetron 4 MG disintegrating tablet Commonly known as:  ZOFRAN ODT   pantoprazole 40 MG tablet Commonly known as:  PROTONIX   potassium chloride SA 20 MEQ tablet Commonly known as:  K-DUR,KLOR-CON     TAKE these medications   acetaminophen 325 MG tablet Commonly known as:  TYLENOL Take 2 tablets (650 mg total) by mouth every 6 (six) hours as needed for mild pain, fever or headache (or Fever >/= 101).   capsaicin 0.025 % cream Commonly known as:  ZOSTRIX Apply topically 2 (two) times daily. Apply to your abdomen 2-3 times a day if you are having problems with nausea, vomiting and pain.  Also take a dose of Reglan and Benadryl as prescribed.   Capsaicin-Menthol-Methyl Sal 0.025-1-12 % Crea Commonly known as:  capsaicin-methyl sal-menthol Apply 1 inch topically 2 (two) times daily.   lidocaine 2 % solution Commonly known as:  XYLOCAINE Use as directed 15 mLs every 3 (three) hours as needed in the mouth or throat  for mouth pain.   omeprazole 20 MG capsule Commonly known as:  PRILOSEC Take 1 capsule (20 mg total) by mouth daily.   ondansetron 4 MG tablet Commonly known as:  ZOFRAN Take 1 tablet (4 mg total) by mouth every 6 (six) hours as needed for nausea or vomiting.   senna-docusate 8.6-50 MG tablet Commonly known as:  Senokot-S Take 2 tablets by mouth at bedtime.       Major procedures and Radiology Reports - PLEASE  review detailed and final reports for all details, in brief -   Ct Chest W Contrast  Result Date: 08/04/2018 CLINICAL DATA:  Abdominal pain with nausea and vomiting as well as fever 3 days. EXAM: CT CHEST, ABDOMEN, AND PELVIS WITH CONTRAST TECHNIQUE: Multidetector CT imaging of the chest, abdomen and pelvis was performed following the standard protocol during bolus administration of intravenous contrast. CONTRAST:  ISOVUE-300 IOPAMIDOL (ISOVUE-300) INJECTION 61% COMPARISON:  Abdominopelvic CT 08/01/2018, 11/04/2017 and 02/19/2016 FINDINGS: CT CHEST FINDINGS Cardiovascular: Heart is normal size. Thoracic aorta and pulmonary arterial system are within normal. Remaining vascular structures are unremarkable. Mediastinum/Nodes: There is a heterogeneous enhancing lobulated anterior mediastinal mass measuring approximately 4.1 x 5.9 x 7.7 cm in AP, transverse and craniocaudal dimensions. No hilar or mediastinal adenopathy. Remaining mediastinal structures are unremarkable. Lungs/Pleura: Lungs are well inflated without focal airspace consolidation or effusion. Airways are normal. Musculoskeletal: Normal. CT ABDOMEN PELVIS FINDINGS Hepatobiliary: There are a few small subcentimeter liver hypodensities too small to characterize but likely cysts. Previous cholecystectomy. Biliary tree is unremarkable. Pancreas: Normal. Spleen: Normal. Adrenals/Urinary Tract: Adrenal glands are normal. Kidneys normal in size without hydronephrosis. There are 2-3 small right renal stones with the largest  measuring 3 mm over the upper pole. Ureters and bladder are normal. Stomach/Bowel: Stomach and small bowel are normal. Appendix is not visualized. Colon is within normal. Vascular/Lymphatic: Vascular structures are within normal. No adenopathy. Reproductive: Normal. Other: No free fluid or focal inflammatory change. Findings suggesting prior umbilical hernia repair. Musculoskeletal: Unremarkable. IMPRESSION: No acute findings in the chest, abdomen or pelvis. Heterogeneous lobulated anterior mediastinal mass measuring 4.1 x 5.9 x 7.7 cm. Differential diagnosis considerations include thymic neoplasm, germ-cell tumor, lymphoma and less likely metastatic disease. Recommend thoracic surgery consultation and consider PET-CT for further evaluation. Few subcentimeter liver hypodensities too small to characterize but likely cysts. Right-sided nephrolithiasis with the largest stone over the upper pole measuring 3 mm. Suggestion previous umbilical hernia repair. These results were called by telephone at the time of interpretation on 08/04/2018 at 1:17 p.m. to Dr. Aviva Kluver , who verbally acknowledged these results. Electronically Signed   By: Elberta Fortis M.D.   On: 08/04/2018 13:19   Ct Abdomen Pelvis W Contrast  Result Date: 08/04/2018 CLINICAL DATA:  Abdominal pain with nausea and vomiting as well as fever 3 days. EXAM: CT CHEST, ABDOMEN, AND PELVIS WITH CONTRAST TECHNIQUE: Multidetector CT imaging of the chest, abdomen and pelvis was performed following the standard protocol during bolus administration of intravenous contrast. CONTRAST:  ISOVUE-300 IOPAMIDOL (ISOVUE-300) INJECTION 61% COMPARISON:  Abdominopelvic CT 08/01/2018, 11/04/2017 and 02/19/2016 FINDINGS: CT CHEST FINDINGS Cardiovascular: Heart is normal size. Thoracic aorta and pulmonary arterial system are within normal. Remaining vascular structures are unremarkable. Mediastinum/Nodes: There is a heterogeneous enhancing lobulated anterior  mediastinal mass measuring approximately 4.1 x 5.9 x 7.7 cm in AP, transverse and craniocaudal dimensions. No hilar or mediastinal adenopathy. Remaining mediastinal structures are unremarkable. Lungs/Pleura: Lungs are well inflated without focal airspace consolidation or effusion. Airways are normal. Musculoskeletal: Normal. CT ABDOMEN PELVIS FINDINGS Hepatobiliary: There are a few small subcentimeter liver hypodensities too small to characterize but likely cysts. Previous cholecystectomy. Biliary tree is unremarkable. Pancreas: Normal. Spleen: Normal. Adrenals/Urinary Tract: Adrenal glands are normal. Kidneys normal in size without hydronephrosis. There are 2-3 small right renal stones with the largest measuring 3 mm over the upper pole. Ureters and bladder are normal. Stomach/Bowel: Stomach and small bowel are normal. Appendix is not visualized. Colon is within  normal. Vascular/Lymphatic: Vascular structures are within normal. No adenopathy. Reproductive: Normal. Other: No free fluid or focal inflammatory change. Findings suggesting prior umbilical hernia repair. Musculoskeletal: Unremarkable. IMPRESSION: No acute findings in the chest, abdomen or pelvis. Heterogeneous lobulated anterior mediastinal mass measuring 4.1 x 5.9 x 7.7 cm. Differential diagnosis considerations include thymic neoplasm, germ-cell tumor, lymphoma and less likely metastatic disease. Recommend thoracic surgery consultation and consider PET-CT for further evaluation. Few subcentimeter liver hypodensities too small to characterize but likely cysts. Right-sided nephrolithiasis with the largest stone over the upper pole measuring 3 mm. Suggestion previous umbilical hernia repair. These results were called by telephone at the time of interpretation on 08/04/2018 at 1:17 p.m. to Dr. Aviva KluverALYSSA MURRAY , who verbally acknowledged these results. Electronically Signed   By: Elberta Fortisaniel  Boyle M.D.   On: 08/04/2018 13:19   Ct Abdomen Pelvis W  Contrast  Result Date: 08/01/2018 CLINICAL DATA:  [age] year old with acute abdominal pain and fever. EXAM: CT ABDOMEN AND PELVIS WITH CONTRAST TECHNIQUE: Multidetector CT imaging of the abdomen and pelvis was performed using the standard protocol following bolus administration of intravenous contrast. CONTRAST:  100mL ISOVUE-300 IOPAMIDOL (ISOVUE-300) INJECTION 61% COMPARISON:  11/04/2017 FINDINGS: Lower chest: Lung bases are clear. Hepatobiliary: Punctate low-density structure near the hepatic dome is unchanged and could represent a small cyst. Gallbladder has been removed. Portal venous system is patent. No biliary dilatation. Pancreas: Unremarkable. No pancreatic ductal dilatation or surrounding inflammatory changes. Spleen: Normal in size without focal abnormality. Adrenals/Urinary Tract: Normal adrenal glands. Normal appearance of the urinary bladder. Punctate low-density in the right kidney probably represents a cyst and similar to the previous examination. Nonobstructive 5 mm stone in the right kidney upper pole. No evidence for left renal calculi. Stomach/Bowel: Normal appearance of the stomach. No evidence for bowel dilatation. There may be a slightly thickened loop of colon in the left lower quadrant possibly representing the proximal sigmoid colon. There are no significant inflammatory changes around this mildly thickened loop of bowel. Vascular/Lymphatic: No significant vascular findings are present. No enlarged abdominal or pelvic lymph nodes. Reproductive: Prostate is unremarkable. Other: Trace free fluid in the pelvis. Densities along the anterior abdominal wall compatible with prior surgery. Musculoskeletal: Small lucencies in the pelvic bones have not significantly changed since 12/09/2015. No acute bone abnormality. IMPRESSION: 1. Trace free fluid in the pelvis could represent an underlying inflammatory process. Question a mildly thickened loop of bowel in the left lower quadrant which may  represent large bowel. Small focus of enteritis or colitis cannot be excluded. 2. Nonobstructive right kidney stone. Electronically Signed   By: Richarda OverlieAdam  Henn M.D.   On: 08/01/2018 19:59   Dg Abdomen Acute W/chest  Result Date: 08/04/2018 CLINICAL DATA:  Abdominal pain and vomiting 1 week. EXAM: DG ABDOMEN ACUTE W/ 1V CHEST COMPARISON:  Chest x-ray 02/26/2018, 02/02/2017 as well as chest x-ray 12/13/2015 FINDINGS: Lungs are adequately inflated and otherwise clear. Cardiac silhouette is normal. Worsening soft tissue prominence over the left hilum/main pulmonary artery segment. Remainder of the chest is unchanged. Abdominopelvic images demonstrate surgical clips over the right upper quadrant. Bowel gas pattern is nonobstructive. Remaining bones and soft tissues are normal. IMPRESSION: Nonobstructive bowel gas pattern. No acute cardiopulmonary disease. Soft tissue prominence of the left hilum/main pulmonary artery segment. Could not exclude adenopathy or mass. Recommend contrast-enhanced chest CT for further evaluation. Electronically Signed   By: Elberta Fortisaniel  Boyle M.D.   On: 08/04/2018 08:33   [place] Abdomen Limited Ruq  Result Date: 08/04/2018 CLINICAL  DATA:  Right upper quadrant pain.  Cholecystectomy. EXAM: ULTRASOUND ABDOMEN LIMITED RIGHT UPPER QUADRANT COMPARISON:  CT 08/01/2018 FINDINGS: Gallbladder: Previous cholecystectomy. Common bile duct: Diameter: 3.7 mm. Liver: No focal lesion identified. Within normal limits in parenchymal echogenicity. Portal vein is patent on color Doppler imaging with normal direction of blood flow towards the liver. IMPRESSION: Previous cholecystectomy, otherwise unremarkable right upper quadrant ultrasound. Electronically Signed   By: Elberta Fortis M.D.   On: 08/04/2018 12:04    Today   Subjective    Matthew Scott today has no new complaints, significant other at bedside, questions answered, no further emesis, tolerating oral intake well, no diarrhea, no fevers           Patient has been seen and examined prior to discharge   Objective   Blood pressure 135/82, pulse 66, temperature 98.4 F (36.9 C), temperature source Oral, resp. rate 19, height 5\' 9"  (1.753 m), weight 84.3 kg, SpO2 99 %.   Intake/Output Summary (Last 24 hours) at 08/06/2018 1250 Last data filed at 08/06/2018 1100 Gross per 24 hour  Intake 749.28 ml  Output 600 ml  Net 149.28 ml    Exam Gen:- Awake Alert, no acute distress  HEENT:- [place].AT, No sclera icterus Neck-Supple Neck,No JVD,.  Lungs-  CTAB , good air movement bilaterally  CV- S1, S2 normal, regular Abd-  +ve B.Sounds, Abd Soft, No tenderness,    Extremity/Skin:- No  edema,   good pulses Psych-affect is appropriate, oriented x3 Neuro-no new focal deficits, no tremors    Data Review   CBC w Diff:  Lab Results  Component Value Date   WBC 9.0 08/05/2018   HGB 12.6 (L) 08/05/2018   HCT 38.6 (L) 08/05/2018   PLT 239 08/05/2018   LYMPHOPCT 36 08/05/2018   MONOPCT 11 08/05/2018   EOSPCT 0 08/05/2018   BASOPCT 0 08/05/2018    CMP:  Lab Results  Component Value Date   NA 137 08/06/2018   K 3.6 08/06/2018   CL 102 08/06/2018   CO2 26 08/06/2018   BUN 6 08/06/2018   CREATININE 0.87 08/06/2018   CREATININE 0.92 08/10/2016   PROT 5.7 (L) 08/06/2018   ALBUMIN 3.6 08/06/2018   BILITOT 2.6 (H) 08/06/2018   ALKPHOS 42 08/06/2018   AST 68 (H) 08/06/2018   ALT 39 08/06/2018  .   Total Discharge time is about 33 minutes  Shon Hale M.D on 08/06/2018 at 12:50 PM  Pager---[phone]  Go to [email or web address] - password [email or web address] for contact info  [facility] - Office  [phone]

## 2018-08-06 NOTE — Discharge Instructions (Signed)
1)You have cannabis induced vomiting (Cannabis/Marijuana induced Cyclic vomiting syndrome)---also called Cannabis/marijuana hyperemesis syndrome concurrent with and due to cannabis abuse ----your vomiting will stop if you quit marijuana/cannabis  2)Check your blood pressure at least 3 times a week,  keep a diary of your blood pressure and follow-up with your primary care physician for reevaluation you may need to start amlodipine 2.5 mg daily for blood pressure if your blood pressure stays elevated  3)Low-salt diet advised  4)You have a Mediastinal Mass--- Heterogeneous lobulated anterior mediastinal mass measuring 4.1 x 5.9 x 7.7 cm......--- spot in your chest, You need to follow-up with pulmonologist as an outpatient within the next month and get a PET scan and further work-up to make sure this is not cancer related.... Avoid tobacco exposure

## 2018-08-06 NOTE — Care Management Note (Signed)
Case Management Note  Patient Details  Name: Matthew Scott MRN: [id number] Date of Birth: 11/04/1980  Subjective/Objective:                   Discharge readiness is indicated by patient meeting Recovery Milestones, including ALL of the following: ? Hemodynamic stability  Yes  ? Pain absent or managed improved ? Surgery not indicated none present ? Oral hydration, medications, and diet ? Full ligs, iv ns with 40 kcl at 100cc/hr ? Ambulatory[N] yes ? willk see next md review   Action/Plan: Will follow for progression of care and clinical status. Will follow for case management needs none present at this time.  Expected Discharge Date:                  Expected Discharge Plan:     In-House Referral:     Discharge planning Services     Post Acute Care Choice:    Choice offered to:     DME Arranged:    DME Agency:     HH Arranged:    HH Agency:     Status of Service:     If discussed at [employer] Meetings, dates discussed:    Additional Comments:  Matthew Scott, Matthew Paris Lynn, RN 08/06/2018, 9:31 AM

## 2018-08-13 ENCOUNTER — Telehealth: Payer: Self-pay

## 2018-08-13 NOTE — Telephone Encounter (Signed)
-----   Message from Hoy RegisterEnobong Newlin, MD sent at 08/06/2018  6:13 PM EST ----- Please inform the patient that labs are normal. Thank you.

## 2018-08-13 NOTE — Telephone Encounter (Signed)
Patient was called and a voicemail was left informing patient to return phone call for lab results. 

## 2018-08-14 NOTE — Telephone Encounter (Signed)
-----   Message from Enobong Newlin, MD sent at 08/06/2018  6:13 PM EST ----- Please inform the patient that labs are normal. Thank you. 

## 2018-08-14 NOTE — Telephone Encounter (Signed)
Patient was called and a message was left informing patient to call for lab results.

## 2018-10-30 ENCOUNTER — Emergency Department (HOSPITAL_COMMUNITY)
Admission: EM | Admit: 2018-10-30 | Discharge: 2018-10-30 | Disposition: A | Discharge status: Home / Self Care | Payer: Self-pay | Attending: Emergency Medicine | Admitting: Emergency Medicine

## 2018-10-30 ENCOUNTER — Other Ambulatory Visit: Payer: Self-pay

## 2018-10-30 DIAGNOSIS — F17290 Nicotine dependence, other tobacco product, uncomplicated: Secondary | ICD-10-CM | POA: Insufficient documentation

## 2018-10-30 DIAGNOSIS — R1115 Cyclical vomiting syndrome unrelated to migraine: Secondary | ICD-10-CM | POA: Insufficient documentation

## 2018-10-30 DIAGNOSIS — Z79899 Other long term (current) drug therapy: Secondary | ICD-10-CM | POA: Insufficient documentation

## 2018-10-30 DIAGNOSIS — I10 Essential (primary) hypertension: Secondary | ICD-10-CM | POA: Insufficient documentation

## 2018-10-30 LAB — URINALYSIS, ROUTINE W REFLEX MICROSCOPIC
Bilirubin Urine: NEGATIVE
Glucose, UA: NEGATIVE mg/dL
Hgb urine dipstick: NEGATIVE
Ketones, ur: 20 mg/dL — AB
Leukocytes,Ua: NEGATIVE
Nitrite: NEGATIVE
Protein, ur: NEGATIVE mg/dL
Specific Gravity, Urine: 1.026 (ref 1.005–1.030)
pH: 6 (ref 5.0–8.0)

## 2018-10-30 LAB — CBC WITH DIFFERENTIAL/PLATELET
Abs Immature Granulocytes: 0.08 10*3/uL — ABNORMAL HIGH (ref 0.00–0.07)
Basophils Absolute: 0 10*3/uL (ref 0.0–0.1)
Basophils Relative: 0 %
Eosinophils Absolute: 0 10*3/uL (ref 0.0–0.5)
Eosinophils Relative: 0 %
HCT: 40.1 % (ref 39.0–52.0)
Hemoglobin: 12.6 g/dL — ABNORMAL LOW (ref 13.0–17.0)
Immature Granulocytes: 1 %
Lymphocytes Relative: 4 %
Lymphs Abs: 0.7 10*3/uL (ref 0.7–4.0)
MCH: 31.9 pg (ref 26.0–34.0)
MCHC: 31.4 g/dL (ref 30.0–36.0)
MCV: 101.5 fL — ABNORMAL HIGH (ref 80.0–100.0)
Monocytes Absolute: 0.6 10*3/uL (ref 0.1–1.0)
Monocytes Relative: 4 %
Neutro Abs: 14 10*3/uL — ABNORMAL HIGH (ref 1.7–7.7)
Neutrophils Relative %: 91 %
Platelets: 249 10*3/uL (ref 150–400)
RBC: 3.95 MIL/uL — ABNORMAL LOW (ref 4.22–5.81)
RDW: 12.4 % (ref 11.5–15.5)
WBC: 15.4 10*3/uL — ABNORMAL HIGH (ref 4.0–10.5)
nRBC: 0 % (ref 0.0–0.2)

## 2018-10-30 LAB — COMPREHENSIVE METABOLIC PANEL
ALT: 20 U/L (ref 0–44)
AST: 36 U/L (ref 15–41)
Albumin: 5 g/dL (ref 3.5–5.0)
Alkaline Phosphatase: 75 U/L (ref 38–126)
Anion gap: 13 (ref 5–15)
BUN: 12 mg/dL (ref 6–20)
CO2: 22 mmol/L (ref 22–32)
Calcium: 9.8 mg/dL (ref 8.9–10.3)
Chloride: 107 mmol/L (ref 98–111)
Creatinine, Ser: 0.95 mg/dL (ref 0.61–1.24)
GFR calc Af Amer: >60 mL/min (ref >60)
GFR calc non Af Amer: >60 mL/min (ref >60)
Glucose, Bld: 126 mg/dL — ABNORMAL HIGH (ref 70–99)
Potassium: 3.9 mmol/L (ref 3.5–5.1)
Sodium: 142 mmol/L (ref 135–145)
Total Bilirubin: 1.8 mg/dL — ABNORMAL HIGH (ref 0.3–1.2)
Total Protein: 8.5 g/dL — ABNORMAL HIGH (ref 6.5–8.1)

## 2018-10-30 MED ORDER — HALOPERIDOL LACTATE 5 MG/ML IJ SOLN
5.0000 mg | Freq: Once | INTRAMUSCULAR | Status: AC
  Administered 2018-10-30: 5 mg via INTRAVENOUS
  Filled 2018-10-30: qty 1

## 2018-10-30 NOTE — ED Provider Notes (Signed)
[facility]-EMERGENCY DEPT Provider Note   CSN: [id number] Arrival date & time: 10/30/18  1155    History   Chief Complaint Chief Complaint  Patient presents with  . Abdominal Pain    [name] is a 38 y.o. male.     HPI  38 year old male with history of gastritis, gastroparesis, cyclic vomiting syndrome and questionable cannabinoid hyperemesis syndrome comes in with chief complaint of abdominal pain, nausea, vomiting.  Patient reports that he started having abdominal pain yesterday.  With that he has had nausea and vomiting x2.  He is denying any diarrhea.  Patient reports that he has had similar pain in the past and he has been told that it could be because of marijuana.  Patient admits to heavy marijuana use every day.   Past Medical History:  Diagnosis Date  . 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 Chronic abdominal pain   . Cyclical vomiting syndrome   . GASTRITIS 12/09/2009   Qualifier: Diagnosis of  By: Daphine Deutscher FNP, Zena Amos    . Gastroparesis   . 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 Nausea and vomiting    chronic, recurrent  . Polysubstance abuse [facility])     Patient Active Problem List   Diagnosis Date Noted  . 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 Intractable nausea and vomiting 08/04/2018  . Elevated AST (SGOT) 08/04/2018  . Uncontrollable vomiting 03/10/2016  . Anemia 03/10/2016  . Metatarsal fracture 12/22/2015  . Essential hypertension 12/22/2015  . Night sweats 01/21/2015  . GERD (gastroesophageal reflux disease) 01/21/2015  . Gastritis 12/19/2014  . Hyperbilirubinemia 12/19/2014  . Elevated bilirubin   . Nausea & vomiting 12/14/2014  . Severe tetrahydrocannabinol (THC) dependence (HCC) 12/14/2014  . Alcohol abuse 12/14/2014  .  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 Gastroparesis 05/11/2012  . Abdominal pain 05/06/2012  . Polysubstance abuse (HCC) 05/06/2012  . CHOLECYSTECTOMY, HX OF 11/25/2009    Past Surgical History:  Procedure Laterality Date  . CHOLECYSTECTOMY  11/2009        Home Medications    Prior to Admission medications   Medication Sig Start Date End Date Taking? Authorizing Provider  acetaminophen (TYLENOL) 325 MG tablet Take 2 tablets (650 mg total) by mouth every 6 (six) hours as needed for mild pain, fever or headache (or Fever >/= 101). 08/06/18   Shon Hale, MD  capsaicin (ZOSTRIX) 0.025 % cream Apply topically 2 (two) times daily. Apply to your abdomen 2-3 times a day if you are having problems with nausea, vomiting and pain.  Also take a dose of Reglan and Benadryl as prescribed. Patient not taking: Reported on 08/01/2018 02/21/18   Arby Barrette, MD  Capsaicin-Menthol-Methyl Sal (CAPSAICIN-METHYL SAL-MENTHOL) 0.025-1-12 % CREA Apply 1 inch topically 2 (two) times daily. Patient not taking: Reported on 08/01/2018 02/19/18   Kellie Shropshire, PA-C  lidocaine (XYLOCAINE) 2 % solution Use as directed 15 mLs every 3 (three) hours as needed in the mouth or throat for mouth pain. Patient not taking: Reported on 08/01/2018 07/01/17   McDonald, Pedro Earls A, PA-C  omeprazole (PRILOSEC) 20 MG capsule Take 1 capsule (20 mg total) by mouth daily. 08/06/18   Shon Hale, MD  ondansetron (ZOFRAN) 4 MG tablet  Take 1 tablet (4 mg total) by mouth every 6 (six) hours as needed for nausea or vomiting. 08/06/18   Shon Hale, MD  senna-docusate (SENOKOT-S) 8.6-50 MG tablet Take 2 tablets by mouth at bedtime. 08/06/18   Shon Hale, MD    Family History Family History  Problem Relation Age of Onset  . Other Father   . Diabetes Father   . Hypertension Father     Social History Social History    Tobacco Use  . Smoking status: Current Some Day Smoker    Years: 8.00    Types: Cigars    Last attempt to quit: 12/15/2014    Years since quitting: 3.8  . Smokeless tobacco: Never Used  Substance Use Topics  . Alcohol use: No    Alcohol/week: 0.0 standard drinks  . Drug use: Yes    Types: Marijuana    Comment: Last used: 4 days ago per patient on 04/30/2018     Allergies   Patient has no known allergies.   Review of Systems Review of Systems  Constitutional: Positive for activity change.  Gastrointestinal: Positive for abdominal pain, nausea and vomiting.  All other systems reviewed and are negative.    Physical Exam Updated Vital Signs BP (!) 145/71 (BP Location: Left Arm)   Pulse (!) 53   Temp 97.9 F (36.6 C) (Oral)   Resp 18   Ht  (1.727 m)   SpO2 99%   BMI 28.27 kg/m   Physical Exam Vitals signs and nursing note reviewed.  Constitutional:      Appearance: He is well-developed.  HENT:     Head: Atraumatic.  Neck:     Musculoskeletal: Neck supple.  Cardiovascular:     Rate and Rhythm: Normal rate.  Pulmonary:     Effort: Pulmonary effort is normal.  Abdominal:     General: Abdomen is flat.     Tenderness: There is generalized abdominal tenderness. There is guarding. There is no rebound.  Skin:    General: Skin is warm.  Neurological:     Mental Status: He is alert and oriented to person, place, and time.      ED Treatments / Results  Labs (all labs ordered are listed, but only abnormal results are displayed) Labs Reviewed  COMPREHENSIVE METABOLIC PANEL - Abnormal; Notable for the following components:      Result Value   Glucose, Bld 126 (*)    Total Protein 8.5 (*)    Total Bilirubin 1.8 (*)    All other components within normal limits  CBC WITH DIFFERENTIAL/PLATELET - Abnormal; Notable for the following components:   WBC 15.4 (*)    RBC 3.95 (*)    Hemoglobin 12.6 (*)    MCV 101.5 (*)    Neutro Abs 14.0 (*)    Abs Immature  Granulocytes 0.08 (*)    All other components within normal limits  URINALYSIS, ROUTINE W REFLEX MICROSCOPIC - Abnormal; Notable for the following components:   Ketones, ur 20 (*)    All other components within normal limits    EKG None  Radiology No results found.  Procedures Procedures (including critical care time)  Medications Ordered in ED Medications  haloperidol lactate (HALDOL) injection 5 mg (5 mg Intravenous Given 10/30/18 1240)     Initial Impression / Assessment and Plan / ED Course  I have reviewed the triage vital signs and the nursing notes.  Pertinent labs & imaging results that were available during my care of the patient  were reviewed by me and considered in my medical decision making (see chart for details).        38 year old comes in a chief complaint of abdominal pain. Patient has known history of gastritis, gastroparesis, cannabinoid hyperemesis syndrome.  He states that his pain is similar to his prior pain.  His pain started yesterday.  On exam he has generalized tenderness without any rebound tenderness.  Patient appears uncomfortable.  He admits to daily heavy marijuana use, therefore we do think that this could be because of his marijuana use.  Plan is to get basic labs and give patient Haldol. If patient does not respond to Haldol then we will reassess and decide on the next step.  3:18 PM Patient has been reassessed twice now.  He is sleeping comfortably.  Given that he had profound response to Haldol, this is likely a cyclic vomiting syndrome or cannabinoid hyperemesis syndrome.  No need for imaging.  We will discharge.  Patient has not vomited in the ER.  Final Clinical Impressions(s) / ED Diagnoses   Final diagnoses:  Cyclic vomiting syndrome    ED Discharge Orders    None       Derwood Kaplan, MD 10/30/18 [phone]

## 2018-10-30 NOTE — ED Triage Notes (Signed)
Pt brought in by EMS d/t upper abdominal pain 0700 am this morning. HX gastritis. C/O N&V.  Pt reports this happens when he smokes to much THC.

## 2018-10-30 NOTE — ED Notes (Signed)
EDP at bedside  

## 2018-10-30 NOTE — ED Triage Notes (Signed)
On assessment pt restless. Pt c/o intermittent abdominal pain that started this morning. Pt c/o N&V- reports vomiting aprox 1 hour ago.

## 2018-10-30 NOTE — ED Notes (Signed)
Pt stated he needed to use the restroom. Pt was given urine specimen cup and told a urine sample was needed. Pt proceeded to not provide urine sample. RN aware.

## 2018-10-30 NOTE — ED Notes (Signed)
Pt observed to be doing push-ups, leg raises in bed.  Pt encouraged to try and relax.

## 2018-10-30 NOTE — ED Notes (Signed)
Bed: [facility] Expected date:  Expected time:  Means of arrival:  Comments: EMS-hyperemesis

## 2018-11-01 ENCOUNTER — Encounter (HOSPITAL_COMMUNITY): Payer: Self-pay | Admitting: Emergency Medicine

## 2018-11-01 ENCOUNTER — Emergency Department (HOSPITAL_COMMUNITY)
Admission: EM | Admit: 2018-11-01 | Discharge: 2018-11-01 | Disposition: A | Discharge status: Home / Self Care | Payer: Self-pay | Attending: Emergency Medicine | Admitting: Emergency Medicine

## 2018-11-01 ENCOUNTER — Other Ambulatory Visit: Payer: Self-pay

## 2018-11-01 DIAGNOSIS — R112 Nausea with vomiting, unspecified: Secondary | ICD-10-CM

## 2018-11-01 DIAGNOSIS — F12988 Cannabis use, unspecified with other cannabis-induced disorder: Secondary | ICD-10-CM | POA: Insufficient documentation

## 2018-11-01 DIAGNOSIS — F1290 Cannabis use, unspecified, uncomplicated: Secondary | ICD-10-CM

## 2018-11-01 DIAGNOSIS — Z79899 Other long term (current) drug therapy: Secondary | ICD-10-CM | POA: Insufficient documentation

## 2018-11-01 DIAGNOSIS — F17210 Nicotine dependence, cigarettes, uncomplicated: Secondary | ICD-10-CM | POA: Insufficient documentation

## 2018-11-01 DIAGNOSIS — I10 Essential (primary) hypertension: Secondary | ICD-10-CM | POA: Insufficient documentation

## 2018-11-01 DIAGNOSIS — R1110 Vomiting, unspecified: Secondary | ICD-10-CM | POA: Insufficient documentation

## 2018-11-01 MED ORDER — SODIUM CHLORIDE 0.9 % IV BOLUS
2000.0000 mL | Freq: Once | INTRAVENOUS | Status: AC
  Administered 2018-11-01: 2000 mL via INTRAVENOUS

## 2018-11-01 MED ORDER — CAPSAICIN 0.025 % EX CREA
TOPICAL_CREAM | Freq: Once | CUTANEOUS | Status: AC
  Administered 2018-11-01: 12:00:00 via TOPICAL
  Filled 2018-11-01: qty 60

## 2018-11-01 NOTE — ED Notes (Signed)
Bed: [facility] Expected date:  Expected time:  Means of arrival:  Comments: EMS 37yo RUQ pain , was here several days ago

## 2018-11-01 NOTE — ED Triage Notes (Signed)
Patient BIB [facility] from home for RUQ abdominal pain, pt seen here a couple days ago for same. Pt c/o n/v since it started this am. Pt states he hasn't eaten much last few days, tried this am and vomited. Given 200 ml NS and 4 mg IV Zofran.

## 2018-11-01 NOTE — Discharge Instructions (Signed)
Use the capsaicin cream 3 or 4 times a day as needed for abdominal pain or vomiting.

## 2018-11-01 NOTE — ED Provider Notes (Signed)
[facility]-EMERGENCY DEPT Provider Note   CSN: [id number] Arrival date & time: 11/01/18  1042    History   Chief Complaint Chief Complaint  Patient presents with  . Abdominal Pain    RUQ    [name] is a 38 y.o. male.     HPI   He presents for evaluation of persistent abdominal pain with nausea and vomiting.  He knows it is related to his heavy use of cannabinoids.  Seen here 2 days ago, treated for same, given prescriptions which he was unable to fill.  He states he does not have enough money to fill them.  He lives with his parents.  Denies bloody emesis.  Denies focal weakness or paresthesia.  There are no other known modifying factors  Past Medical History:  Diagnosis Date  . 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 Chronic abdominal pain   . Cyclical vomiting syndrome   . GASTRITIS 12/09/2009   Qualifier: Diagnosis of  By: Daphine Deutscher FNP, Zena Amos    . Gastroparesis   . 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 Nausea and vomiting    chronic, recurrent  . Polysubstance abuse [facility])     Patient Active Problem List   Diagnosis Date Noted  . 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 Intractable nausea and vomiting 08/04/2018  . Elevated AST (SGOT) 08/04/2018  . Uncontrollable vomiting 03/10/2016  . Anemia 03/10/2016  . Metatarsal fracture 12/22/2015  . Essential hypertension 12/22/2015  . Night sweats 01/21/2015  . GERD (gastroesophageal reflux disease) 01/21/2015  . Gastritis 12/19/2014  . Hyperbilirubinemia 12/19/2014  . Elevated bilirubin   . Nausea & vomiting 12/14/2014  . Severe tetrahydrocannabinol (THC) dependence (HCC) 12/14/2014  . Alcohol abuse 12/14/2014  . 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 Intractable vomiting secondary to  cannabis hyperemesis syndrome 12/05/2014  . Gastroparesis 05/11/2012  . Abdominal pain 05/06/2012  . Polysubstance abuse (HCC) 05/06/2012  . CHOLECYSTECTOMY, HX OF 11/25/2009    Past Surgical History:  Procedure Laterality Date  . CHOLECYSTECTOMY  11/2009        Home Medications    Prior to Admission medications   Medication Sig Start Date End Date Taking? Authorizing Provider  acetaminophen (TYLENOL) 325 MG tablet Take 2 tablets (650 mg total) by mouth every 6 (six) hours as needed for mild pain, fever or headache (or Fever >/= 101). 08/06/18  Yes Emokpae, Courage, MD  omeprazole (PRILOSEC) 20 MG capsule Take 1 capsule (20 mg total) by mouth daily. 08/06/18  Yes Emokpae, Courage, MD  ondansetron (ZOFRAN) 4 MG tablet Take 1 tablet (4 mg total) by mouth every 6 (six) hours as needed for nausea or vomiting. 08/06/18  Yes Emokpae, Courage, MD  senna-docusate (SENOKOT-S) 8.6-50 MG tablet Take 2 tablets by mouth at bedtime. 08/06/18  Yes Emokpae, Courage, MD  capsaicin (ZOSTRIX) 0.025 % cream Apply topically 2 (two) times daily. Apply to your abdomen 2-3 times a day if you are having problems with nausea, vomiting and pain.  Also take a dose of Reglan and Benadryl as prescribed. Patient not taking: Reported on 08/01/2018 02/21/18   Arby Barrette, MD  Capsaicin-Menthol-Methyl Sal (CAPSAICIN-METHYL SAL-MENTHOL) 0.025-1-12 % CREA Apply 1 inch topically 2 (two) times daily. Patient not taking: Reported on 08/01/2018 02/19/18   Shirlyn Goltz  J, PA-C  lidocaine (XYLOCAINE) 2 % solution Use as directed 15 mLs every 3 (three) hours as needed in the mouth or throat for mouth pain. Patient not taking: Reported on 08/01/2018 07/01/17   Barkley Boards, PA-C    Family History Family History  Problem Relation Age of Onset  . Other Father   . Diabetes Father   . Hypertension Father     Social History Social History   Tobacco Use  . Smoking status: Current Some Day Smoker    Years: 8.00     Types: Cigars    Last attempt to quit: 12/15/2014    Years since quitting: 3.8  . Smokeless tobacco: Never Used  Substance Use Topics  . Alcohol use: No    Alcohol/week: 0.0 standard drinks  . Drug use: Yes    Types: Marijuana    Comment: Last used: 4 days ago per patient on 04/30/2018     Allergies   Patient has no known allergies.   Review of Systems Review of Systems  All other systems reviewed and are negative.    Physical Exam Updated Vital Signs BP 129/68   Pulse 68   Temp 98 F (36.7 C) (Oral)   Resp 18   Ht  (1.727 m)   Wt 83.9 kg   SpO2 97%   BMI 28.13 kg/m   Physical Exam Vitals signs and nursing note reviewed.  Constitutional:      General: He is not in acute distress.    Appearance: He is well-developed. He is not ill-appearing, toxic-appearing or diaphoretic.  HENT:     Head: Normocephalic and atraumatic.     Right Ear: External ear normal.     Left Ear: External ear normal.  Eyes:     Conjunctiva/sclera: Conjunctivae normal.     Pupils: Pupils are equal, round, and reactive to light.  Neck:     Musculoskeletal: Normal range of motion and neck supple.     Trachea: Phonation normal.  Cardiovascular:     Rate and Rhythm: Normal rate and regular rhythm.     Heart sounds: Normal heart sounds.  Pulmonary:     Effort: Pulmonary effort is normal.     Breath sounds: Normal breath sounds.  Abdominal:     Palpations: Abdomen is soft.     Tenderness: There is abdominal tenderness (Diffuse, moderate). There is guarding.  Musculoskeletal: Normal range of motion.  Skin:    General: Skin is warm and dry.  Neurological:     Mental Status: He is alert and oriented to person, place, and time.     Cranial Nerves: No cranial nerve deficit.     Sensory: No sensory deficit.     Motor: No abnormal muscle tone.     Coordination: Coordination normal.  Psychiatric:        Behavior: Behavior normal.        Thought Content: Thought content normal.         Judgment: Judgment normal.      ED Treatments / Results  Labs (all labs ordered are listed, but only abnormal results are displayed) Labs Reviewed - No data to display  EKG None  Radiology No results found.  Procedures Procedures (including critical care time)  Medications Ordered in ED Medications  capsaicin (ZOSTRIX) 0.025 % cream ( Topical Given 11/01/18 1141)  sodium chloride 0.9 % bolus 2,000 mL (0 mLs Intravenous Stopped 11/01/18 1348)     Initial Impression / Assessment and Plan / ED  Course  I have reviewed the triage vital signs and the nursing notes.  Pertinent labs & imaging results that were available during my care of the patient were reviewed by me and considered in my medical decision making (see chart for details).         Patient Vitals for the past 24 hrs:  BP Temp Temp src Pulse Resp SpO2 Height Weight  11/01/18 1345 129/68 98 F (36.7 C) Oral 68 18 97 % - -  11/01/18 1106 - - - - - - 5\' 8"  (1.727 m) 83.9 kg  11/01/18 1058 137/75 98.8 F (37.1 C) Oral 64 17 100 % - -  11/01/18 1052 - - - - - 99 % - -    1:50 PM Reevaluation with update and discussion. After initial assessment and treatment, an updated evaluation reveals patient is currently doing push-ups on the floor.  He states his abdominal pain and vomiting have resolved.  Findings discussed and questions answered. Mancel Bale   Medical Decision Making: Nonspecific nausea and vomiting, with marijuana use.  Suspect cannabinoid emesis syndrome.  Doubt hemodynamic instability, or metabolic compromise.  CRITICAL CARE-no Performed by: Mancel Bale  Nursing Notes Reviewed/ Care Coordinated Applicable Imaging Reviewed Interpretation of Laboratory Data incorporated into ED treatment  The patient appears reasonably screened and/or stabilized for discharge and I doubt any other medical condition or other [facility] requiring further screening, evaluation, or treatment in the ED at this time prior to  discharge.  Plan: Home Medications-continue using capsaicin as needed; Home Treatments-avoid marijuana; return here if the recommended treatment, does not improve the symptoms; Recommended follow up-PCP, PRN   Final Clinical Impressions(s) / ED Diagnoses   Final diagnoses:  Cannabinoid hyperemesis syndrome [facility])    ED Discharge Orders    None       Mancel Bale, MD 11/01/18 1354

## 2018-11-01 NOTE — ED Notes (Signed)
When reassessing pt, pt was found to be doing push-ups on the floor of treatment room. Pt states that this "helps with my stomach".

## 2019-01-30 ENCOUNTER — Other Ambulatory Visit: Payer: Self-pay

## 2019-01-30 ENCOUNTER — Emergency Department (HOSPITAL_COMMUNITY)
Admission: EM | Admit: 2019-01-30 | Discharge: 2019-01-31 | Disposition: A | Discharge status: Home / Self Care | Payer: Self-pay | Attending: Emergency Medicine | Admitting: Emergency Medicine

## 2019-01-30 DIAGNOSIS — F17210 Nicotine dependence, cigarettes, uncomplicated: Secondary | ICD-10-CM | POA: Insufficient documentation

## 2019-01-30 DIAGNOSIS — R1084 Generalized abdominal pain: Secondary | ICD-10-CM | POA: Insufficient documentation

## 2019-01-30 DIAGNOSIS — F12188 Cannabis abuse with other cannabis-induced disorder: Secondary | ICD-10-CM | POA: Insufficient documentation

## 2019-01-30 DIAGNOSIS — I10 Essential (primary) hypertension: Secondary | ICD-10-CM | POA: Insufficient documentation

## 2019-01-30 LAB — CBC WITH DIFFERENTIAL/PLATELET
Abs Immature Granulocytes: 0.05 10*3/uL (ref 0.00–0.07)
Basophils Absolute: 0 10*3/uL (ref 0.0–0.1)
Basophils Relative: 0 %
Eosinophils Absolute: 0 10*3/uL (ref 0.0–0.5)
Eosinophils Relative: 0 %
HCT: 41.2 % (ref 39.0–52.0)
Hemoglobin: 13.6 g/dL (ref 13.0–17.0)
Immature Granulocytes: 0 %
Lymphocytes Relative: 16 %
Lymphs Abs: 2.5 10*3/uL (ref 0.7–4.0)
MCH: 32.9 pg (ref 26.0–34.0)
MCHC: 33 g/dL (ref 30.0–36.0)
MCV: 99.5 fL (ref 80.0–100.0)
Monocytes Absolute: 1.1 10*3/uL — ABNORMAL HIGH (ref 0.1–1.0)
Monocytes Relative: 7 %
Neutro Abs: 12 10*3/uL — ABNORMAL HIGH (ref 1.7–7.7)
Neutrophils Relative %: 77 %
Platelets: 280 10*3/uL (ref 150–400)
RBC: 4.14 MIL/uL — ABNORMAL LOW (ref 4.22–5.81)
RDW: 11.9 % (ref 11.5–15.5)
WBC: 15.6 10*3/uL — ABNORMAL HIGH (ref 4.0–10.5)
nRBC: 0 % (ref 0.0–0.2)

## 2019-01-30 LAB — COMPREHENSIVE METABOLIC PANEL
ALT: 19 U/L (ref 0–44)
AST: 32 U/L (ref 15–41)
Albumin: 4.9 g/dL (ref 3.5–5.0)
Alkaline Phosphatase: 76 U/L (ref 38–126)
Anion gap: 15 (ref 5–15)
BUN: 12 mg/dL (ref 6–20)
CO2: 21 mmol/L — ABNORMAL LOW (ref 22–32)
Calcium: 10.4 mg/dL — ABNORMAL HIGH (ref 8.9–10.3)
Chloride: 106 mmol/L (ref 98–111)
Creatinine, Ser: 1.03 mg/dL (ref 0.61–1.24)
GFR calc Af Amer: >60 mL/min (ref >60)
GFR calc non Af Amer: >60 mL/min (ref >60)
Glucose, Bld: 122 mg/dL — ABNORMAL HIGH (ref 70–99)
Potassium: 4.3 mmol/L (ref 3.5–5.1)
Sodium: 142 mmol/L (ref 135–145)
Total Bilirubin: 1.5 mg/dL — ABNORMAL HIGH (ref 0.3–1.2)
Total Protein: 8.9 g/dL — ABNORMAL HIGH (ref 6.5–8.1)

## 2019-01-30 LAB — CK: Total CK: 407 U/L — ABNORMAL HIGH (ref 49–397)

## 2019-01-30 LAB — LIPASE, BLOOD: Lipase: 23 U/L (ref 11–51)

## 2019-01-30 MED ORDER — SODIUM CHLORIDE 0.9 % IV BOLUS
1000.0000 mL | Freq: Once | INTRAVENOUS | Status: AC
  Administered 2019-01-30: 1000 mL via INTRAVENOUS

## 2019-01-30 MED ORDER — CAPSAICIN 0.025 % EX CREA
TOPICAL_CREAM | Freq: Once | CUTANEOUS | Status: AC
  Administered 2019-01-31: 02:00:00 via TOPICAL
  Filled 2019-01-30: qty 60

## 2019-01-30 MED ORDER — LORAZEPAM 2 MG/ML IJ SOLN
1.0000 mg | Freq: Once | INTRAMUSCULAR | Status: AC
  Administered 2019-01-30: 1 mg via INTRAVENOUS
  Filled 2019-01-30: qty 1

## 2019-01-30 MED ORDER — HALOPERIDOL LACTATE 5 MG/ML IJ SOLN
5.0000 mg | Freq: Once | INTRAMUSCULAR | Status: AC
  Administered 2019-01-30: 5 mg via INTRAVENOUS
  Filled 2019-01-30: qty 1

## 2019-01-30 NOTE — ED Notes (Signed)
Pt is very diaphoretic and the EKG leads will not stay on him. Patient reports being unable to stay still and that "it hurts so bad" RN made MD [facility] aware.

## 2019-01-30 NOTE — ED Triage Notes (Signed)
Per EMS: Pt is having nausea, emesis, and abdominal pain after smoking marijuanna. Pt has a hx of cyclic nausea and emesis following smoking.  VSS

## 2019-01-30 NOTE — ED Notes (Signed)
MD at bedside. 

## 2019-01-30 NOTE — ED Notes (Signed)
Bed: [facility] Expected date:  Expected time:  Means of arrival:  Comments: EMS:Abd pain

## 2019-01-30 NOTE — ED Provider Notes (Signed)
[facility] DEPT Provider Note   CSN: [id number] Arrival date & time: 01/30/19  2035    History   Chief Complaint Chief Complaint  Patient presents with  . Nausea    [name] is a 38 y.o. male.     HPI Patient with history of cyclic vomiting syndrome and gastroparesis presents with abdominal pain, nausea and vomiting.  States this is been going on for the past 2 days but worsened today. Past Medical History:  Diagnosis Date  . 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 Chronic abdominal pain   . Cyclical vomiting syndrome   . GASTRITIS 12/09/2009   Qualifier: Diagnosis of  By: Hassell Done FNP, Tori Milks    . Gastroparesis   . HELICOBACTER PYLORI INFECTION 12/14/2009   Qualifier: Diagnosis of  By: Isla Pence    . Nausea and vomiting    chronic, recurrent  . Polysubstance abuse [facility])     Patient Active Problem List   Diagnosis Date Noted  . 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 Cannabis hyperemesis syndrome concurrent with and due to cannabis abuse ([place]) 08/06/2018  . Intractable nausea and vomiting 08/04/2018  . Elevated AST (SGOT) 08/04/2018  . Uncontrollable vomiting 03/10/2016  . Anemia 03/10/2016  . Metatarsal fracture 12/22/2015  . Essential hypertension 12/22/2015  . Night sweats 01/21/2015  . GERD (gastroesophageal reflux disease) 01/21/2015  . Gastritis 12/19/2014  . Hyperbilirubinemia 12/19/2014  . Elevated bilirubin   . Nausea & vomiting 12/14/2014  . Severe tetrahydrocannabinol (THC) dependence ([place]) 12/14/2014  . Alcohol abuse 12/14/2014  . 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 Gastroparesis 05/11/2012  . Abdominal pain 05/06/2012  . Polysubstance abuse ([place]) 05/06/2012  . CHOLECYSTECTOMY, HX OF 11/25/2009    Past Surgical History:  Procedure Laterality Date  .  CHOLECYSTECTOMY  11/2009        Home Medications    Prior to Admission medications   Not on File    Family History Family History  Problem Relation Age of Onset  . Other Father   . Diabetes Father   . Hypertension Father     Social History Social History   Tobacco Use  . Smoking status: Current Some Day Smoker    Years: 8.00    Types: Cigars    Last attempt to quit: 12/15/2014    Years since quitting: 4.1  . Smokeless tobacco: Never Used  Substance Use Topics  . Alcohol use: No    Alcohol/week: 0.0 standard drinks  . Drug use: Yes    Types: Marijuana    Comment: Last used: 4 days ago per patient on 04/30/2018     Allergies   Patient has no known allergies.   Review of Systems Review of Systems  Constitutional: Positive for diaphoresis. Negative for chills and fever.  Respiratory: Negative for cough and shortness of breath.   Cardiovascular: Negative for chest pain.  Gastrointestinal: Positive for abdominal pain, nausea and vomiting. Negative for constipation and diarrhea.  Genitourinary: Negative for dysuria, flank pain and frequency.  Musculoskeletal: Negative for back pain, neck pain and neck stiffness.  Skin: Negative for rash and wound.  Neurological: Negative for dizziness, weakness, light-headedness, numbness and headaches.  All other systems reviewed and are negative.    Physical Exam Updated Vital Signs BP 115/83   Pulse 63   Temp 100.3 F (37.9 C) (Rectal)  Resp 19   SpO2 99%   Physical Exam Vitals signs and nursing note reviewed.  Constitutional:      General: He is in acute distress.     Appearance: He is well-developed. He is diaphoretic.     Comments: Writhing in bed.  Diaphoretic.  HENT:     Head: Normocephalic and atraumatic.     Nose: Nose normal.     Mouth/Throat:     Mouth: Mucous membranes are moist.  Eyes:     Extraocular Movements: Extraocular movements intact.     Pupils: Pupils are equal, round, and reactive to light.   Neck:     Musculoskeletal: Normal range of motion and neck supple.  Cardiovascular:     Rate and Rhythm: Normal rate and regular rhythm.  Pulmonary:     Effort: Pulmonary effort is normal.     Breath sounds: Normal breath sounds.  Abdominal:     General: Bowel sounds are normal.     Palpations: Abdomen is soft.     Tenderness: There is abdominal tenderness. There is no guarding or rebound.     Comments: Diffuse abdominal tenderness to palpation.  Musculoskeletal: Normal range of motion.        General: No swelling, tenderness, deformity or signs of injury.     Right lower leg: No edema.     Left lower leg: No edema.  Skin:    General: Skin is warm.     Findings: No erythema or rash.  Neurological:     General: No focal deficit present.     Mental Status: He is alert and oriented to person, place, and time.  Psychiatric:        Behavior: Behavior normal.      ED Treatments / Results  Labs (all labs ordered are listed, but only abnormal results are displayed) Labs Reviewed  CBC WITH DIFFERENTIAL/PLATELET - Abnormal; Notable for the following components:      Result Value   WBC 15.6 (*)    RBC 4.14 (*)    Neutro Abs 12.0 (*)    Monocytes Absolute 1.1 (*)    All other components within normal limits  COMPREHENSIVE METABOLIC PANEL - Abnormal; Notable for the following components:   CO2 21 (*)    Glucose, Bld 122 (*)    Calcium 10.4 (*)    Total Protein 8.9 (*)    Total Bilirubin 1.5 (*)    All other components within normal limits  CK - Abnormal; Notable for the following components:   Total CK 407 (*)    All other components within normal limits  LIPASE, BLOOD    EKG EKG Interpretation  Date/Time:  Wednesday January 30 2019 21:23:16 EDT Ventricular Rate:  68 PR Interval:    QRS Duration: 87 QT Interval:  365 QTC Calculation: 389 R Axis:   52 Text Interpretation:  Sinus arrhythmia since last tracing no significant change Confirmed by Mancel BaleWentz, Elliott [phone]([id number]) on  01/31/2019 1:45:29 PM   Radiology No results found.  Procedures Procedures (including critical care time)  Medications Ordered in ED Medications  haloperidol lactate (HALDOL) injection 5 mg (5 mg Intravenous Given 01/30/19 2114)  LORazepam (ATIVAN) injection 1 mg (1 mg Intravenous Given 01/30/19 2114)  capsaicin (ZOSTRIX) 0.025 % cream ( Topical Given 01/31/19 0152)  sodium chloride 0.9 % bolus 1,000 mL (0 mLs Intravenous Stopped 01/31/19 0006)     Initial Impression / Assessment and Plan / ED Course  I have reviewed the triage vital signs  and the nursing notes.  Pertinent labs & imaging results that were available during my care of the patient were reviewed by me and considered in my medical decision making (see chart for details).        Patient is responded well to Haldol in the past.  Given dose of Haldol and small dose of Ativan.  Patient is now resting comfortably.  Vital signs have normalized. Signed out to oncoming emergency physician pending repeat exam and likely discharge home. Final Clinical Impressions(s) / ED Diagnoses   Final diagnoses:  Cannabis hyperemesis syndrome concurrent with and due to cannabis abuse [facility](HCC)  Generalized abdominal pain    ED Discharge Orders    None       Loren RacerYelverton, Cypher Paule, MD 01/31/19 1558

## 2019-01-31 NOTE — ED Notes (Signed)
Pt has been asked to call his ride twice, pt continues to lay in the bed with head covered

## 2019-01-31 NOTE — ED Notes (Signed)
Pt got in touch with a ride and given a scrub top to wear home

## 2019-01-31 NOTE — ED Notes (Signed)
Pt still drowsy, answers questions but doesn't open his eyes

## 2019-02-01 ENCOUNTER — Emergency Department (HOSPITAL_COMMUNITY): Payer: Self-pay

## 2019-02-01 ENCOUNTER — Inpatient Hospital Stay (HOSPITAL_COMMUNITY)
Admission: EM | Admit: 2019-02-01 | Discharge: 2019-02-03 | DRG: 918 | Disposition: A | Discharge status: Home / Self Care | Payer: Self-pay | Attending: Internal Medicine | Admitting: Internal Medicine

## 2019-02-01 DIAGNOSIS — F1220 Cannabis dependence, uncomplicated: Secondary | ICD-10-CM | POA: Diagnosis present

## 2019-02-01 DIAGNOSIS — R1115 Cyclical vomiting syndrome unrelated to migraine: Secondary | ICD-10-CM | POA: Diagnosis present

## 2019-02-01 DIAGNOSIS — Z1159 Encounter for screening for other viral diseases: Secondary | ICD-10-CM

## 2019-02-01 DIAGNOSIS — R112 Nausea with vomiting, unspecified: Secondary | ICD-10-CM

## 2019-02-01 DIAGNOSIS — Z833 Family history of diabetes mellitus: Secondary | ICD-10-CM

## 2019-02-01 DIAGNOSIS — R1110 Vomiting, unspecified: Secondary | ICD-10-CM | POA: Diagnosis present

## 2019-02-01 DIAGNOSIS — F17290 Nicotine dependence, other tobacco product, uncomplicated: Secondary | ICD-10-CM | POA: Diagnosis present

## 2019-02-01 DIAGNOSIS — T407X1A Poisoning by cannabis (derivatives), accidental (unintentional), initial encounter: Principal | ICD-10-CM | POA: Diagnosis present

## 2019-02-01 DIAGNOSIS — K3184 Gastroparesis: Secondary | ICD-10-CM | POA: Diagnosis present

## 2019-02-01 DIAGNOSIS — Z8249 Family history of ischemic heart disease and other diseases of the circulatory system: Secondary | ICD-10-CM

## 2019-02-01 DIAGNOSIS — M6282 Rhabdomyolysis: Secondary | ICD-10-CM | POA: Diagnosis present

## 2019-02-01 LAB — COMPREHENSIVE METABOLIC PANEL
ALT: 22 U/L (ref 0–44)
AST: 31 U/L (ref 15–41)
Albumin: 4.6 g/dL (ref 3.5–5.0)
Alkaline Phosphatase: 69 U/L (ref 38–126)
Anion gap: 11 (ref 5–15)
BUN: 12 mg/dL (ref 6–20)
CO2: 25 mmol/L (ref 22–32)
Calcium: 9.7 mg/dL (ref 8.9–10.3)
Chloride: 105 mmol/L (ref 98–111)
Creatinine, Ser: 0.89 mg/dL (ref 0.61–1.24)
GFR calc Af Amer: >60 mL/min (ref >60)
GFR calc non Af Amer: >60 mL/min (ref >60)
Glucose, Bld: 110 mg/dL — ABNORMAL HIGH (ref 70–99)
Potassium: 3.5 mmol/L (ref 3.5–5.1)
Sodium: 141 mmol/L (ref 135–145)
Total Bilirubin: 1.4 mg/dL — ABNORMAL HIGH (ref 0.3–1.2)
Total Protein: 7.8 g/dL (ref 6.5–8.1)

## 2019-02-01 LAB — CK: Total CK: 729 U/L — ABNORMAL HIGH (ref 49–397)

## 2019-02-01 LAB — URINALYSIS, ROUTINE W REFLEX MICROSCOPIC
Bilirubin Urine: NEGATIVE
Glucose, UA: 50 mg/dL — AB
Hgb urine dipstick: NEGATIVE
Ketones, ur: 5 mg/dL — AB
Leukocytes,Ua: NEGATIVE
Nitrite: NEGATIVE
Protein, ur: NEGATIVE mg/dL
Specific Gravity, Urine: 1.028 (ref 1.005–1.030)
pH: 6 (ref 5.0–8.0)

## 2019-02-01 LAB — CBC WITH DIFFERENTIAL/PLATELET
Abs Immature Granulocytes: 0.03 10*3/uL (ref 0.00–0.07)
Basophils Absolute: 0 10*3/uL (ref 0.0–0.1)
Basophils Relative: 0 %
Eosinophils Absolute: 0 10*3/uL (ref 0.0–0.5)
Eosinophils Relative: 0 %
HCT: 37.7 % — ABNORMAL LOW (ref 39.0–52.0)
Hemoglobin: 12.2 g/dL — ABNORMAL LOW (ref 13.0–17.0)
Immature Granulocytes: 0 %
Lymphocytes Relative: 12 %
Lymphs Abs: 0.9 10*3/uL (ref 0.7–4.0)
MCH: 32 pg (ref 26.0–34.0)
MCHC: 32.4 g/dL (ref 30.0–36.0)
MCV: 99 fL (ref 80.0–100.0)
Monocytes Absolute: 0.3 10*3/uL (ref 0.1–1.0)
Monocytes Relative: 4 %
Neutro Abs: 6.8 10*3/uL (ref 1.7–7.7)
Neutrophils Relative %: 84 %
Platelets: 262 10*3/uL (ref 150–400)
RBC: 3.81 MIL/uL — ABNORMAL LOW (ref 4.22–5.81)
RDW: 11.9 % (ref 11.5–15.5)
WBC: 8.1 10*3/uL (ref 4.0–10.5)
nRBC: 0 % (ref 0.0–0.2)

## 2019-02-01 LAB — RAPID URINE DRUG SCREEN, HOSP PERFORMED
Amphetamines: NOT DETECTED
Barbiturates: NOT DETECTED
Benzodiazepines: NOT DETECTED
Cocaine: NOT DETECTED
Opiates: NOT DETECTED
Tetrahydrocannabinol: POSITIVE — AB

## 2019-02-01 LAB — SARS CORONAVIRUS 2 BY RT PCR (HOSPITAL ORDER, PERFORMED IN ~~LOC~~ HOSPITAL LAB): SARS Coronavirus 2: NEGATIVE

## 2019-02-01 LAB — MAGNESIUM: Magnesium: 2 mg/dL (ref 1.7–2.4)

## 2019-02-01 LAB — LIPASE, BLOOD: Lipase: 24 U/L (ref 11–51)

## 2019-02-01 MED ORDER — IOHEXOL 300 MG/ML  SOLN
100.0000 mL | Freq: Once | INTRAMUSCULAR | Status: AC | PRN
  Administered 2019-02-01: 100 mL via INTRAVENOUS

## 2019-02-01 MED ORDER — ONDANSETRON HCL 4 MG PO TABS: 4.0000 mg | ORAL_TABLET | Freq: Four times a day (QID) | ORAL | Status: DC | PRN

## 2019-02-01 MED ORDER — SODIUM CHLORIDE 0.9 % IV BOLUS
1000.0000 mL | Freq: Once | INTRAVENOUS | Status: AC
  Administered 2019-02-01: 1000 mL via INTRAVENOUS

## 2019-02-01 MED ORDER — ONDANSETRON HCL 4 MG/2ML IJ SOLN: 4.0000 mg | Freq: Four times a day (QID) | INTRAMUSCULAR | Status: DC | PRN

## 2019-02-01 MED ORDER — MAGNESIUM CITRATE PO SOLN: 1.0000 | Freq: Once | ORAL | Status: DC | PRN

## 2019-02-01 MED ORDER — ACETAMINOPHEN 650 MG RE SUPP: 650.0000 mg | Freq: Four times a day (QID) | RECTAL | Status: DC | PRN

## 2019-02-01 MED ORDER — BISACODYL 5 MG PO TBEC: 5.0000 mg | DELAYED_RELEASE_TABLET | Freq: Every day | ORAL | Status: DC | PRN

## 2019-02-01 MED ORDER — ZOLPIDEM TARTRATE 5 MG PO TABS: 5.0000 mg | ORAL_TABLET | Freq: Every evening | ORAL | Status: DC | PRN

## 2019-02-01 MED ORDER — ACETAMINOPHEN 325 MG PO TABS: 650.0000 mg | ORAL_TABLET | Freq: Four times a day (QID) | ORAL | Status: DC | PRN

## 2019-02-01 MED ORDER — OXYCODONE HCL 5 MG PO TABS
5.0000 mg | ORAL_TABLET | ORAL | Status: DC | PRN
  Administered 2019-02-02 – 2019-02-03 (×2): 5 mg via ORAL
  Filled 2019-02-01 (×2): qty 1

## 2019-02-01 MED ORDER — CAPSAICIN 0.025 % EX CREA
TOPICAL_CREAM | Freq: Once | CUTANEOUS | Status: AC
  Administered 2019-02-01: 14:00:00 via TOPICAL
  Filled 2019-02-01: qty 60

## 2019-02-01 MED ORDER — POTASSIUM CHLORIDE IN NACL 40-0.9 MEQ/L-% IV SOLN
INTRAVENOUS | Status: DC
  Administered 2019-02-02: 100 mL/h via INTRAVENOUS
  Filled 2019-02-01: qty 1000

## 2019-02-01 MED ORDER — ONDANSETRON HCL 4 MG/2ML IJ SOLN
4.0000 mg | Freq: Once | INTRAMUSCULAR | Status: AC
  Administered 2019-02-01: 4 mg via INTRAVENOUS
  Filled 2019-02-01: qty 2

## 2019-02-01 MED ORDER — HYDROMORPHONE HCL 1 MG/ML IJ SOLN
0.5000 mg | Freq: Once | INTRAMUSCULAR | Status: AC
  Administered 2019-02-01: 0.5 mg via INTRAVENOUS
  Filled 2019-02-01: qty 1

## 2019-02-01 MED ORDER — SENNOSIDES-DOCUSATE SODIUM 8.6-50 MG PO TABS: 1.0000 | ORAL_TABLET | Freq: Every evening | ORAL | Status: DC | PRN

## 2019-02-01 MED ORDER — HALOPERIDOL LACTATE 5 MG/ML IJ SOLN
5.0000 mg | Freq: Once | INTRAMUSCULAR | Status: AC
  Administered 2019-02-01: 5 mg via INTRAVENOUS
  Filled 2019-02-01: qty 1

## 2019-02-01 NOTE — ED Notes (Signed)
ED TO INPATIENT HANDOFF REPORT  ED Nurse Name and Phone #: Irving BurtonEmily, RN  S Name/Age/Gender Matthew Scott 38 y.o. male Room/Bed: WA23/WA23  Code Status   Code Status: Prior  Home/SNF/Other Home   Is this baseline? Yes   Triage Complete: Triage complete  Chief Complaint abdominal pain  Triage Note Pt ED via EMS, Pt c/o abdominal pain, reports was just in ED other day with same complaint. Pt restless, reports frequent THC use. #18 LFA, Zofran 4MG  given by EMS   Allergies No Known Allergies  Level of Care/Admitting Diagnosis ED Disposition    ED Disposition Condition Comment   Admit  [facility] [[id number]]  Level of Care: Med-Surg [16]  Covid Evaluation: Screening Protocol (No Symptoms)  Diagnosis: Cyclic vomiting syndrome [161096][[id number]]  Admitting Physician: Tonye RoyaltyHUGELMEYER, ALEXIS [[id number]]  Attending Physician: Tonye RoyaltyHUGELMEYER, ALEXIS [[id number]]  PT Class (Do Not Modify): Observation [104]  PT Acc Code (Do Not Modify): Observation [10022]       B Medical/Surgery History Past Medical History:  Diagnosis Date  . 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 Chronic abdominal pain   . Cyclical vomiting syndrome   . GASTRITIS 12/09/2009   Qualifier: Diagnosis of  By: Daphine DeutscherMartin FNP, Zena AmosNykedtra    . Gastroparesis   . HELICOBACTER PYLORI INFECTION 12/14/2009   Qualifier: Diagnosis of  By: Levon Hedgerraddock, Brenda    . Nausea and vomiting    chronic, recurrent  . Polysubstance abuse [facility](HCC)    Past Surgical History:  Procedure Laterality Date  . CHOLECYSTECTOMY  11/2009     A IV Location/Drains/Wounds Patient Lines/Drains/Airways Status   Active Line/Drains/Airways    Name:   Placement date:   Placement time:   Site:   Days:   Peripheral IV 02/01/19 Anterior;Left;Proximal Forearm   02/01/19    -    Forearm   less than 1          Intake/Output Last 24 hours No intake or output data in the 24 hours ending 02/01/19  2118  Labs/Imaging Results for orders placed or performed during the hospital encounter of 02/01/19 (from the past 48 hour(s))  CBC with Differential     Status: Abnormal   Collection Time: 02/01/19  1:18 PM  Result Value Ref Range   WBC 8.1 4.0 - 10.5 K/uL   RBC 3.81 (L) 4.22 - 5.81 MIL/uL   Hemoglobin 12.2 (L) 13.0 - 17.0 g/dL   HCT 29.537.7 (L) 62.139.0 - 30.852.0 %   MCV 99.0 80.0 - 100.0 fL   MCH 32.0 26.0 - 34.0 pg   MCHC 32.4 30.0 - 36.0 g/dL   RDW 65.711.9 84.611.5 - 96.215.5 %   Platelets 262 150 - 400 K/uL   nRBC 0.0 0.0 - 0.2 %   Neutrophils Relative % 84 %   Neutro Abs 6.8 1.7 - 7.7 K/uL   Lymphocytes Relative 12 %   Lymphs Abs 0.9 0.7 - 4.0 K/uL   Monocytes Relative 4 %   Monocytes Absolute 0.3 0.1 - 1.0 K/uL   Eosinophils Relative 0 %   Eosinophils Absolute 0.0 0.0 - 0.5 K/uL   Basophils Relative 0 %   Basophils Absolute 0.0 0.0 - 0.1 K/uL   Immature Granulocytes 0 %   Abs Immature Granulocytes 0.03 0.00 - 0.07 K/uL    Comment: Performed at [facility], [address] [place], [place] [address]  Comprehensive metabolic panel  Status: Abnormal   Collection Time: 02/01/19  1:18 PM  Result Value Ref Range   Sodium 141 135 - 145 mmol/L   Potassium 3.5 3.5 - 5.1 mmol/L    Comment: DELTA CHECK NOTED REPEATED TO VERIFY    Chloride 105 98 - 111 mmol/L   CO2 25 22 - 32 mmol/L   Glucose, Bld 110 (H) 70 - 99 mg/dL   BUN 12 6 - 20 mg/dL   Creatinine, Ser 1.190.89 0.61 - 1.24 mg/dL   Calcium 9.7 8.9 - 14.710.3 mg/dL   Total Protein 7.8 6.5 - 8.1 g/dL   Albumin 4.6 3.5 - 5.0 g/dL   AST 31 15 - 41 U/L   ALT 22 0 - 44 U/L   Alkaline Phosphatase 69 38 - 126 U/L   Total Bilirubin 1.4 (H) 0.3 - 1.2 mg/dL   GFR calc non Af Amer >60 >60 mL/min   GFR calc Af Amer >60 >60 mL/min   Anion gap 11 5 - 15    Comment: Performed at [facility], [address] [place], [place] [address]  Lipase, blood     Status: None   Collection Time: 02/01/19  1:18 PM  Result  Value Ref Range   Lipase 24 11 - 51 U/L    Comment: Performed at [facility], [address] [place], [place] [address]  CK     Status: Abnormal   Collection Time: 02/01/19  1:18 PM  Result Value Ref Range   Total CK 729 (H) 49 - 397 U/L    Comment: Performed at [facility], [address] [place], [place] [address]  Magnesium     Status: None   Collection Time: 02/01/19  1:18 PM  Result Value Ref Range   Magnesium 2.0 1.7 - 2.4 mg/dL    Comment: Performed at [facility], [address] [place], [place] [address]  Urinalysis, Routine w reflex microscopic     Status: Abnormal   Collection Time: 02/01/19  6:56 PM  Result Value Ref Range   Color, Urine YELLOW YELLOW   APPearance CLEAR CLEAR   Specific Gravity, Urine 1.028 1.005 - 1.030   pH 6.0 5.0 - 8.0   Glucose, UA 50 (A) NEGATIVE mg/dL   Hgb urine dipstick NEGATIVE NEGATIVE   Bilirubin Urine NEGATIVE NEGATIVE   Ketones, ur 5 (A) NEGATIVE mg/dL   Protein, ur NEGATIVE NEGATIVE mg/dL   Nitrite NEGATIVE NEGATIVE   Leukocytes,Ua NEGATIVE NEGATIVE    Comment: Performed at [facility], [address] [place], [place] [address]  SARS Coronavirus 2 (CEPHEID - Performed in [facility] hospital lab), Hosp Order     Status: None   Collection Time: 02/01/19  6:56 PM   Specimen: Nasopharyngeal Swab  Result Value Ref Range   SARS Coronavirus 2 NEGATIVE NEGATIVE    Comment: (NOTE) If result is NEGATIVE SARS-CoV-2 target nucleic acids are NOT DETECTED. The SARS-CoV-2 RNA is generally detectable in upper and lower  respiratory specimens during the acute phase of infection. The lowest  concentration of SARS-CoV-2 viral copies this assay can detect is 250  copies / mL. A negative result does not preclude SARS-CoV-2 infection  and should not be used as the sole basis for treatment or other  patient management decisions.  A negative result may occur with   improper specimen collection / handling, submission of specimen other  than nasopharyngeal swab, presence of viral mutation(s) within the  areas  targeted by this assay, and inadequate number of viral copies  (<250 copies / mL). A negative result must be combined with clinical  observations, patient history, and epidemiological information. If result is POSITIVE SARS-CoV-2 target nucleic acids are DETECTED. The SARS-CoV-2 RNA is generally detectable in upper and lower  respiratory specimens dur ing the acute phase of infection.  Positive  results are indicative of active infection with SARS-CoV-2.  Clinical  correlation with patient history and other diagnostic information is  necessary to determine patient infection status.  Positive results do  not rule out bacterial infection or co-infection with other viruses. If result is PRESUMPTIVE POSTIVE SARS-CoV-2 nucleic acids MAY BE PRESENT.   A presumptive positive result was obtained on the submitted specimen  and confirmed on repeat testing.  While 2019 novel coronavirus  (SARS-CoV-2) nucleic acids may be present in the submitted sample  additional confirmatory testing may be necessary for epidemiological  and / or clinical management purposes  to differentiate between  SARS-CoV-2 and other Sarbecovirus currently known to infect humans.  If clinically indicated additional testing with an alternate test  methodology [phone]) is advised. The SARS-CoV-2 RNA is generally  detectable in upper and lower respiratory sp ecimens during the acute  phase of infection. The expected result is Negative. Fact Sheet for Patients:  StrictlyIdeas.no Fact Sheet for Healthcare Providers: BankingDealers.co.za This test is not yet approved or cleared by the [place] FDA and has been authorized for detection and/or diagnosis of SARS-CoV-2 by FDA under an Emergency Use Authorization (EUA).  This EUA will  remain in effect (meaning this test can be used) for the duration of the COVID-19 declaration under Section 564(b)(1) of the Act, 21 U.S.C. section 360bbb-3(b)(1), unless the authorization is terminated or revoked sooner. Performed at [facility], [place] [address] [place], [place] [address]    Ct Abdomen Pelvis W Contrast  Result Date: 02/01/2019 CLINICAL DATA:  Nausea, vomiting, abdominal pain EXAM: CT ABDOMEN AND PELVIS WITH CONTRAST TECHNIQUE: Multidetector CT imaging of the abdomen and pelvis was performed using the standard protocol following bolus administration of intravenous contrast. CONTRAST:  166mL OMNIPAQUE IOHEXOL 300 MG/ML  SOLN COMPARISON:  11/04/2017 FINDINGS: Lower chest: No acute abnormality. Hepatobiliary: No focal liver abnormality is seen. Status post cholecystectomy. No biliary dilatation. Pancreas: Unremarkable. No pancreatic ductal dilatation or surrounding inflammatory changes. Spleen: Normal in size without significant abnormality. Adrenals/Urinary Tract: Adrenal glands are unremarkable. Small nonobstructive right-sided renal calculi. No hydronephrosis. Bladder is unremarkable. Stomach/Bowel: Stomach is within normal limits. Appendix appears normal. No evidence of bowel wall thickening, distention, or inflammatory changes. Vascular/Lymphatic: No significant vascular findings are present. No enlarged abdominal or pelvic lymph nodes. Reproductive: No mass or other significant abnormality. Other: No abdominal wall hernia or abnormality. Evidence of prior umbilical hernia repair. No abdominopelvic ascites. Musculoskeletal: No acute or significant osseous findings. IMPRESSION: 1. No acute CT findings of the abdomen or pelvis to explain nausea, vomiting, or abdominal pain. 2.  Status post cholecystectomy and umbilical hernia repair. 3.  Nonobstructive right nephrolithiasis. Electronically Signed   By: Eddie Candle M.D.   On: 02/01/2019 16:57    Pending  Labs Unresulted Labs (From admission, onward)    Start     Ordered   02/01/19 2111  Urine rapid drug screen (hosp performed)  ONCE - STAT,   STAT     02/01/19 2111   Signed and Held  Basic metabolic panel  Tomorrow morning,   R     Signed  and Held   Signed and Held  CBC  Tomorrow morning,   R     Signed and Held          Vitals/Pain Today's Vitals   02/01/19 1800 02/01/19 1800 02/01/19 1830 02/01/19 1900  BP: (!) 165/98  (!) 153/56 (!) 174/89  Pulse: (!) 54  (!) 50 (!) 45  Resp: 16  20 19   Temp:  97.6 F (36.4 C)    TempSrc:  Oral    SpO2: 100%  100% 100%  Weight:      Height:      PainSc:        Isolation Precautions No active isolations  Medications Medications  sodium chloride 0.9 % bolus 1,000 mL (1,000 mLs Intravenous New Bag/Given 02/01/19 1558)  sodium chloride 0.9 % bolus 1,000 mL (0 mLs Intravenous Stopped 02/01/19 1430)  haloperidol lactate (HALDOL) injection 5 mg (5 mg Intravenous Given 02/01/19 1322)  capsaicin (ZOSTRIX) 0.025 % cream ( Topical Given 02/01/19 1341)  HYDROmorphone (DILAUDID) injection 0.5 mg (0.5 mg Intravenous Given 02/01/19 1715)  ondansetron (ZOFRAN) injection 4 mg (4 mg Intravenous Given 02/01/19 1714)  iohexol (OMNIPAQUE) 300 MG/ML solution 100 mL (100 mLs Intravenous Contrast Given 02/01/19 1628)    Mobility walks Low fall risk      R Recommendations: See Admitting Provider Note  Report given to: Brynda Greathouseandall, RN

## 2019-02-01 NOTE — ED Provider Notes (Signed)
Care assumed from [facility], please see her note for full details, but in brief Matthew Scott is a 38 y.o. male with hx of severe marijuana dependency and cyclic vomiting, presents with persistent N/V, hx of the same usually relieved by hot showers, haldol, capsaicin. Was seen in ED for similar 2 days ago and returns with persistent symptoms. Labs overall reassuring, CK elevated likely in setting of dehydration, not high enough to raise concern for rhabdo.  Received haldol and capsacin in ED with initial improvement and pt able to rest, but then was found writhing in pain, pt ripped out IV and had begun vomiting again.  Nursing re-establishing IV access, pain and nausea meds ordered and CT, if CT normal but vomiting persists, admit for intractable vomiting  Physical Exam  BP (!) 169/93   Pulse 65   Resp 14   Ht 5\' 9"  (1.753 m)   Wt 86.2 kg   SpO2 100%   BMI 28.06 kg/m   Physical Exam Vitals signs and nursing note reviewed.  Constitutional:      General: He is not in acute distress.    Appearance: He is well-developed. He is not diaphoretic.  HENT:     Head: Normocephalic and atraumatic.  Eyes:     General:        Right eye: No discharge.        Left eye: No discharge.  Pulmonary:     Effort: Pulmonary effort is normal. No respiratory distress.  Abdominal:     Comments: Abdomen soft, nondistended, mild generalized tenderness, no focal pain, no guarding or peritoneal signs  Neurological:     Mental Status: He is alert.     Coordination: Coordination normal.  Psychiatric:        Behavior: Behavior normal.     ED Course/Procedures   Labs Reviewed  CBC WITH DIFFERENTIAL/PLATELET - Abnormal; Notable for the following components:      Result Value   RBC 3.81 (*)    Hemoglobin 12.2 (*)    HCT 37.7 (*)    All other components within normal limits  COMPREHENSIVE METABOLIC PANEL - Abnormal; Notable for the following components:   Glucose, Bld 110 (*)    Total  Bilirubin 1.4 (*)    All other components within normal limits  CK - Abnormal; Notable for the following components:   Total CK 729 (*)    All other components within normal limits  LIPASE, BLOOD  MAGNESIUM  URINALYSIS, ROUTINE W REFLEX MICROSCOPIC   Ct Abdomen Pelvis W Contrast  Result Date: 02/01/2019 CLINICAL DATA:  Nausea, vomiting, abdominal pain EXAM: CT ABDOMEN AND PELVIS WITH CONTRAST TECHNIQUE: Multidetector CT imaging of the abdomen and pelvis was performed using the standard protocol following bolus administration of intravenous contrast. CONTRAST:  138mL OMNIPAQUE IOHEXOL 300 MG/ML  SOLN COMPARISON:  11/04/2017 FINDINGS: Lower chest: No acute abnormality. Hepatobiliary: No focal liver abnormality is seen. Status post cholecystectomy. No biliary dilatation. Pancreas: Unremarkable. No pancreatic ductal dilatation or surrounding inflammatory changes. Spleen: Normal in size without significant abnormality. Adrenals/Urinary Tract: Adrenal glands are unremarkable. Small nonobstructive right-sided renal calculi. No hydronephrosis. Bladder is unremarkable. Stomach/Bowel: Stomach is within normal limits. Appendix appears normal. No evidence of bowel wall thickening, distention, or inflammatory changes. Vascular/Lymphatic: No significant vascular findings are present. No enlarged abdominal or pelvic lymph nodes. Reproductive: No mass or other significant abnormality. Other: No abdominal wall hernia or abnormality. Evidence of prior umbilical hernia repair. No abdominopelvic ascites. Musculoskeletal: No acute  or significant osseous findings. IMPRESSION: 1. No acute CT findings of the abdomen or pelvis to explain nausea, vomiting, or abdominal pain. 2.  Status post cholecystectomy and umbilical hernia repair. 3.  Nonobstructive right nephrolithiasis. Electronically Signed   By: Lauralyn PrimesAlex  Bibbey M.D.   On: 02/01/2019 16:57   Medications  sodium chloride 0.9 % bolus 1,000 mL (1,000 mLs Intravenous New  Bag/Given 02/01/19 1558)  sodium chloride 0.9 % bolus 1,000 mL (1,000 mLs Intravenous New Bag/Given 02/01/19 1321)  haloperidol lactate (HALDOL) injection 5 mg (5 mg Intravenous Given 02/01/19 1322)  capsaicin (ZOSTRIX) 0.025 % cream ( Topical Given 02/01/19 1341)  HYDROmorphone (DILAUDID) injection 0.5 mg (0.5 mg Intravenous Given 02/01/19 1715)  ondansetron (ZOFRAN) injection 4 mg (4 mg Intravenous Given 02/01/19 1714)  iohexol (OMNIPAQUE) 300 MG/ML solution 100 mL (100 mLs Intravenous Contrast Given 02/01/19 1628)    Procedures  MDM   CT without acute findings.  Patient attempted p.o. challenge with water and was initially able to keep water down but then began vomiting again.  Given that patient has received multiple antiemetics at this time and symptoms persist will admit for intractable nausea vomiting.  Case discussed with Dr. Emmit PomfretHugelmeyer with [facility] hospitalist who will see and admit the patient.  Final diagnoses:  Intractable vomiting with nausea, unspecified vomiting type       Dartha LodgeFord, Amariona Rathje N, PA-C 02/02/19 0222    Terrilee FilesButler, Michael C, MD 02/02/19 [phone]

## 2019-02-01 NOTE — ED Notes (Signed)
Patient transported to CT 

## 2019-02-01 NOTE — ED Provider Notes (Signed)
[facility] DEPT Provider Note   CSN: [id number] Arrival date & time: 02/01/19  1238     History   Chief Complaint Chief Complaint  Patient presents with  . Abdominal Pain    [name] is a 38 y.o. 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 He has had multiple visits for cyclic and vomiting.  He was here yesterday with the same symptoms of diffuse abdominal pain, intractable vomiting.  He states that he has had cyclic vomiting for many years he continues to smoke marijuana daily.  He has been unable to hold down any food or fluid since discharge.  He states that he did not get AVS paperwork or he does not know where it is and does not have any antiemetics at home.  He denies any urinary symptoms, chest pain, shortness of breath.  He states that he gets relief when he puts a hot pack on his abdomen or takes a very hot shower.     HPI  Past Medical History:  Diagnosis Date  . 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 Chronic abdominal pain   . Cyclical vomiting syndrome   . GASTRITIS 12/09/2009   Qualifier: Diagnosis of  By: Hassell Done FNP, Tori Milks    . Gastroparesis   . HELICOBACTER PYLORI INFECTION 12/14/2009   Qualifier: Diagnosis of  By: Isla Pence    . Nausea and vomiting    chronic, recurrent  . Polysubstance abuse [facility])     Patient Active Problem List   Diagnosis Date Noted  . 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 Cannabis hyperemesis syndrome concurrent with and due to cannabis abuse ([place]) 08/06/2018  . Intractable nausea and vomiting 08/04/2018  . Elevated AST (SGOT) 08/04/2018  . Uncontrollable vomiting 03/10/2016  . Anemia 03/10/2016  . Metatarsal fracture 12/22/2015  .  Essential hypertension 12/22/2015  . Night sweats 01/21/2015  . GERD (gastroesophageal reflux disease) 01/21/2015  . Gastritis 12/19/2014  . Hyperbilirubinemia 12/19/2014  . Elevated bilirubin   . Nausea & vomiting 12/14/2014  . Severe tetrahydrocannabinol (THC) dependence ([place]) 12/14/2014  . Alcohol abuse 12/14/2014  . 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 Gastroparesis 05/11/2012  . Abdominal pain 05/06/2012  . Polysubstance abuse ([place]) 05/06/2012  . CHOLECYSTECTOMY, HX OF 11/25/2009    Past Surgical History:  Procedure Laterality Date  . CHOLECYSTECTOMY  11/2009        Home Medications    Prior to Admission medications   Not on File    Family History Family History  Problem Relation Age of Onset  . Other Father   . Diabetes Father   . Hypertension Father     Social History Social History   Tobacco Use  . Smoking status: Current Some Day Smoker    Years: 8.00    Types: Cigars    Last attempt to quit: 12/15/2014    Years since quitting: 4.1  . Smokeless tobacco: Never Used  Substance Use Topics  . Alcohol use: No    Alcohol/week: 0.0 standard drinks  . Drug use: Yes    Types: Marijuana    Comment: Last used: 4 days ago per patient on 04/30/2018     Allergies   Patient has no known  allergies.   Review of Systems Review of Systems Ten systems reviewed and are negative for acute change, except as noted in the HPI.    Physical Exam Updated Vital Signs SpO2 98%   Physical Exam Vitals signs and nursing note reviewed.  Constitutional:      General: He is not in acute distress.    Appearance: He is well-developed. He is diaphoretic.  HENT:     Head: Normocephalic and atraumatic.     Mouth/Throat:     Mouth: Mucous membranes are dry.  Eyes:     General: No scleral icterus.    Conjunctiva/sclera: Conjunctivae normal.  Neck:     Musculoskeletal: Normal  range of motion and neck supple.  Cardiovascular:     Rate and Rhythm: Normal rate and regular rhythm.     Heart sounds: Normal heart sounds.  Pulmonary:     Effort: Pulmonary effort is normal. No respiratory distress.     Breath sounds: Normal breath sounds.  Abdominal:     Palpations: Abdomen is soft.     Tenderness: There is generalized abdominal tenderness. There is guarding.  Musculoskeletal:     Comments: Spasms and cramps  Skin:    General: Skin is warm.  Neurological:     Mental Status: He is alert.  Psychiatric:        Behavior: Behavior normal.      ED Treatments / Results  Labs (all labs ordered are listed, but only abnormal results are displayed) Labs Reviewed - No data to display  EKG    Radiology No results found.  Procedures Procedures (including critical care time)  Medications Ordered in ED Medications - No data to display   Initial Impression / Assessment and Plan / ED Course  I have reviewed the triage vital signs and the nursing notes.  Pertinent labs & imaging results that were available during my care of the patient were reviewed by me and considered in my medical decision making (see chart for details).  Clinical Course as of Feb 03 1530  Fri Feb 01, 2019  1307 Ekg without qt prolongation   [AH]  1506 Patient was given Haldol and capsaicin cream upon arrival.  He has been doing well however he is currently complaining of severe pain and is on all fours on the floor complaining that he is going to throw up.  Will go ahead with pain medications, Zofran and a CT scan.   [AH]    Clinical Course User Index [AH] Arthor CaptainHarris, Jhana Giarratano, PA-C       Patient work-up currently still pending.  [facility] but some mild anemia, CMP shows no significant abnormalities.  Lipase within normal limits.  CK is elevated from previous however far below levels concerning for rhabdomyolysis.  Magnesium is  within normal limits.  CT pending.  Have given signout to PA Ala DachFord who will assume care of the patient Final Clinical Impressions(s) / ED Diagnoses   Final diagnoses:  None    ED Discharge Orders    None       Arthor CaptainHarris, Natan Hartog, PA-C 02/04/19 1532    Samuel JesterMcManus, Kathleen, DO 02/07/19 1052

## 2019-02-01 NOTE — ED Triage Notes (Addendum)
Pt ED via EMS, Pt c/o abdominal pain, reports was just in ED other day with same complaint. Pt restless, reports frequent THC use. #18 LFA, Zofran 4MG  given by EMS

## 2019-02-01 NOTE — Progress Notes (Signed)
[facility] ED TOC CM -referral 5 ED visits/1 IP admissions  Pt states no insurance/PCP. Will need PCP follow up appt. Will continue to follow for dc needs. Provided pt with drug rehab resources. Jonnie Finner RN CCM Case Mgmt phone [phone]

## 2019-02-01 NOTE — ED Notes (Signed)
Bed: WA23 Expected date:  Expected time:  Means of arrival:  Comments: EMS-abdominal pain 

## 2019-02-01 NOTE — ED Notes (Signed)
Fluid offered per MD order. pt initially tolerated water, but now is vomiting. EDP made aware.

## 2019-02-01 NOTE — H&P (Signed)
History and Physical   [facility] [place] [facility] [place] Admission History and Physical [employer], D.O.    Patient Name: Matthew Scott MR#: [id number] Date of Birth: May 07, 1981 Date of Admission: 02/01/2019  Referring MD/NP/PA: PA Jodi Geralds Primary Care Physician: Hoy Register, MD  Chief Complaint:  Chief Complaint  Patient presents with  . Abdominal Pain    HPI: Matthew Scott is a 38 y.o. 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  Patient has had multiple ER visits for the same and continues to smoke marijuana daily.    Patient denies fevers/chills, weakness, dizziness, chest pain, shortness of breath,  dysuria/frequency, changes in mental status.    Otherwise there has been no change in status. Patient has been taking medication as prescribed and there has been no recent change in medication or diet.  No recent antibiotics.  There has been no recent illness, hospitalizations, travel or sick contacts.    EMS/ED Course: Patient received capsaicin, Haldol, Zofran, NS. Medical admission has been requested for further management of cyclic vomiting syndrome.  Review of Systems:  CONSTITUTIONAL: No fever/chills, fatigue, weakness, weight gain/loss, headache. EYES: No blurry or double vision. ENT: No tinnitus, postnasal drip, redness or soreness of the oropharynx. RESPIRATORY: No cough, dyspnea, wheeze.  No hemoptysis.  CARDIOVASCULAR: No chest pain, palpitations, syncope, orthopnea. No lower extremity edema.  GASTROINTESTINAL: Positive nausea, vomiting, abdominal pain. Negative diarrhea, constipation.  No hematemesis, melena or hematochezia. GENITOURINARY: No dysuria, frequency, hematuria. ENDOCRINE: No polyuria or nocturia. No heat or cold intolerance. HEMATOLOGY: No anemia, bruising, bleeding. INTEGUMENTARY: No rashes, ulcers,  lesions. MUSCULOSKELETAL: No arthritis, gout. NEUROLOGIC: No numbness, tingling, ataxia, seizure-type activity, weakness. PSYCHIATRIC: No anxiety, depression, insomnia.   Past Medical History:  Diagnosis Date  . 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 Chronic abdominal pain   . Cyclical vomiting syndrome   . GASTRITIS 12/09/2009   Qualifier: Diagnosis of  By: Daphine Deutscher FNP, Zena Amos    . Gastroparesis   . 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 Nausea and vomiting    chronic, recurrent  . Polysubstance abuse [facility])     Past Surgical History:  Procedure Laterality Date  . CHOLECYSTECTOMY  11/2009     reports that he has been smoking cigars. He has smoked for the past 8.00 years. He has never used smokeless tobacco. He reports current drug use. Drug: Marijuana. He reports that he does not drink alcohol.  No Known Allergies  Family History  Problem Relation Age of Onset  . Other Father   . Diabetes Father   . Hypertension Father     Prior to Admission medications   Not on File    Physical Exam: Vitals:   02/01/19 1800 02/01/19 1800 02/01/19 1830 02/01/19 1900  BP: (!) 165/98  (!) 153/56 (!) 174/89  Pulse: (!) 54  (!) 50 (!) 45  Resp: 16  20 19   Temp:  97.6 F (36.4 C)    TempSrc:  Oral    SpO2: 100%  100% 100%  Weight:      Height:        GENERAL: 38 y.o.-year-old male patient, well-developed, well-nourished lying in the bed in no acute distress.  Pleasant and cooperative.   HEENT: Head atraumatic, normocephalic. Pupils equal. Mucus membranes moist. NECK:  Supple. No JVD. CHEST: Normal breath sounds bilaterally. No wheezing, rales, rhonchi or crackles. No use of accessory muscles of respiration.  No reproducible chest wall tenderness.  CARDIOVASCULAR: S1, S2 normal. No murmurs, rubs, or gallops. Cap refill <2 seconds. Pulses intact distally.  ABDOMEN: Soft,diffusely  tender, voluntary guarding. Normoactive bowel sounds present in all four quadrants.  EXTREMITIES: No pedal edema, cyanosis, or clubbing. No calf tenderness or Homan's sign.  NEUROLOGIC: The patient is alert and oriented x 3. Cranial nerves II through XII are grossly intact with no focal sensorimotor deficit. PSYCHIATRIC:  Normal affect, mood, thought content. SKIN: Warm, dry, and intact without obvious rash, lesion, or ulcer.    Labs on Admission:  CBC: Recent Labs  Lab 01/30/19 2116 02/01/19 1318  WBC 15.6* 8.1  NEUTROABS 12.0* 6.8  HGB 13.6 12.2*  HCT 41.2 37.7*  MCV 99.5 99.0  PLT 280 262   Basic Metabolic Panel: Recent Labs  Lab 01/30/19 2116 02/01/19 1318  NA 142 141  K 4.3 3.5  CL 106 105  CO2 21* 25  GLUCOSE 122* 110*  BUN 12 12  CREATININE 1.03 0.89  CALCIUM 10.4* 9.7  MG  --  2.0   GFR: Estimated Creatinine Clearance: 121.2 mL/min (by C-G formula based on SCr of 0.89 mg/dL). Liver Function Tests: Recent Labs  Lab 01/30/19 2116 02/01/19 1318  AST 32 31  ALT 19 22  ALKPHOS 76 69  BILITOT 1.5* 1.4*  PROT 8.9* 7.8  ALBUMIN 4.9 4.6   Recent Labs  Lab 01/30/19 2116 02/01/19 1318  LIPASE 23 24   No results for input(s): AMMONIA in the last 168 hours. Coagulation Profile: No results for input(s): INR, PROTIME in the last 168 hours. Cardiac Enzymes: Recent Labs  Lab 01/30/19 2116 02/01/19 1318  CKTOTAL 407* 729*   BNP (last 3 results) No results for input(s): PROBNP in the last 8760 hours. HbA1C: No results for input(s): HGBA1C in the last 72 hours. CBG: No results for input(s): GLUCAP in the last 168 hours. Lipid Profile: No results for input(s): CHOL, HDL, LDLCALC, TRIG, CHOLHDL, LDLDIRECT in the last 72 hours. Thyroid Function Tests: No results for input(s): TSH, T4TOTAL, FREET4, T3FREE, THYROIDAB in the last 72 hours. Anemia Panel: No results for input(s): VITAMINB12, FOLATE, FERRITIN, TIBC, IRON, RETICCTPCT in the last 72 hours. Urine  analysis:    Component Value Date/Time   COLORURINE YELLOW 10/30/2018 1303   APPEARANCEUR CLEAR 10/30/2018 1303   LABSPEC 1.026 10/30/2018 1303   PHURINE 6.0 10/30/2018 1303   GLUCOSEU NEGATIVE 10/30/2018 1303   HGBUR NEGATIVE 10/30/2018 1303   BILIRUBINUR NEGATIVE 10/30/2018 1303   KETONESUR 20 (A) 10/30/2018 1303   PROTEINUR NEGATIVE 10/30/2018 1303   UROBILINOGEN 1.0 04/27/2015 1206   NITRITE NEGATIVE 10/30/2018 1303   LEUKOCYTESUR NEGATIVE 10/30/2018 1303   Sepsis Labs: @LABRCNTIP (procalcitonin:4,lacticidven:4) )No results found for this or any previous visit (from the past 240 hour(s)).   Radiological Exams on Admission: Ct Abdomen Pelvis W Contrast  Result Date: 02/01/2019 CLINICAL DATA:  Nausea, vomiting, abdominal pain EXAM: CT ABDOMEN AND PELVIS WITH CONTRAST TECHNIQUE: Multidetector CT imaging of the abdomen and pelvis was performed using the standard protocol following bolus administration of intravenous contrast. CONTRAST:  OMNIPAQUE IOHEXOL 300 MG/ML  SOLN COMPARISON:  11/04/2017 FINDINGS: Lower chest: No acute abnormality. Hepatobiliary: No focal liver abnormality is seen. Status post cholecystectomy. No biliary dilatation. Pancreas: Unremarkable. No pancreatic ductal dilatation or surrounding inflammatory changes. Spleen: Normal in size without significant abnormality. Adrenals/Urinary Tract:  Adrenal glands are unremarkable. Small nonobstructive right-sided renal calculi. No hydronephrosis. Bladder is unremarkable. Stomach/Bowel: Stomach is within normal limits. Appendix appears normal. No evidence of bowel wall thickening, distention, or inflammatory changes. Vascular/Lymphatic: No significant vascular findings are present. No enlarged abdominal or pelvic lymph nodes. Reproductive: No mass or other significant abnormality. Other: No abdominal wall hernia or abnormality. Evidence of prior umbilical hernia repair. No abdominopelvic ascites. Musculoskeletal: No acute or  significant osseous findings. IMPRESSION: 1. No acute CT findings of the abdomen or pelvis to explain nausea, vomiting, or abdominal pain. 2.  Status post cholecystectomy and umbilical hernia repair. 3.  Nonobstructive right nephrolithiasis. Electronically Signed   By: Lauralyn Primes M.D.   On: 02/01/2019 16:57     Assessment/Plan  This is a 38 y.o. male with a history of cyclic vomiting 2/2 THC use, gastroparesis, polysubstance abuse now being admitted with:  #. Intractable vomiting 2/2 cyclic vomiting - Admit to obs - IV antiemetics - Trial of Haldol - Pain control  #. Mild hypokalemia - Replace IV  #. Mild rhabdo - IVFs  Admission status: Obs IV Fluids: NS Diet/Nutrition: NPO Consults called: None  DVT Px: SCDs and early ambulation. Code Status: Full Code  Disposition Plan: To home in < 24 hours  All the records are reviewed and case discussed with ED provider. Management plans discussed with the patient and/or family who express understanding and agree with plan of care.  Fraser Busche D.O. on 02/01/2019 at 7:18 PM CC: Primary care physician; Hoy Register, MD   02/01/2019, 7:18 PM

## 2019-02-02 ENCOUNTER — Encounter (HOSPITAL_COMMUNITY): Payer: Self-pay | Admitting: General Practice

## 2019-02-02 ENCOUNTER — Other Ambulatory Visit: Payer: Self-pay

## 2019-02-02 DIAGNOSIS — R1115 Cyclical vomiting syndrome unrelated to migraine: Secondary | ICD-10-CM

## 2019-02-02 DIAGNOSIS — R112 Nausea with vomiting, unspecified: Secondary | ICD-10-CM

## 2019-02-02 LAB — BASIC METABOLIC PANEL
Anion gap: 11 (ref 5–15)
BUN: 9 mg/dL (ref 6–20)
CO2: 25 mmol/L (ref 22–32)
Calcium: 9.8 mg/dL (ref 8.9–10.3)
Chloride: 103 mmol/L (ref 98–111)
Creatinine, Ser: 0.76 mg/dL (ref 0.61–1.24)
GFR calc Af Amer: >60 mL/min (ref >60)
GFR calc non Af Amer: >60 mL/min (ref >60)
Glucose, Bld: 139 mg/dL — ABNORMAL HIGH (ref 70–99)
Potassium: 4.1 mmol/L (ref 3.5–5.1)
Sodium: 139 mmol/L (ref 135–145)

## 2019-02-02 LAB — CBC
HCT: 39.3 % (ref 39.0–52.0)
Hemoglobin: 12.7 g/dL — ABNORMAL LOW (ref 13.0–17.0)
MCH: 32.2 pg (ref 26.0–34.0)
MCHC: 32.3 g/dL (ref 30.0–36.0)
MCV: 99.5 fL (ref 80.0–100.0)
Platelets: 236 10*3/uL (ref 150–400)
RBC: 3.95 MIL/uL — ABNORMAL LOW (ref 4.22–5.81)
RDW: 11.8 % (ref 11.5–15.5)
WBC: 8.8 10*3/uL (ref 4.0–10.5)
nRBC: 0 % (ref 0.0–0.2)

## 2019-02-02 MED ORDER — SODIUM CHLORIDE 0.9 % IV SOLN
INTRAVENOUS | Status: DC
  Administered 2019-02-02: 11:00:00 via INTRAVENOUS

## 2019-02-02 NOTE — Progress Notes (Signed)
Matthew Scott back to bed. No acute distress noted. Reinforced the teaching  and cooperation with staff for his safety and medical procedures. Encouraged this patient to included staff assistance and voiced any all his concerns and needs.  Matthew Scott voiced understanding at his time.[name]

## 2019-02-02 NOTE — Progress Notes (Signed)
PT Cancellation Note  Patient Details Name: Matthew Scott MRN: [id number] DOB: 08-Sep-1980   Cancelled Treatment:    Reason Eval/Treat Not Completed: PT screened, no needs identified, will sign off   Weston Anna, PT Acute Rehabilitation Services Pager: [phone] Office: [phone]

## 2019-02-02 NOTE — Progress Notes (Signed)
Received Matthew Scott to room 2924 for cyclic vomiting due to [facility]. This patient has flat affect, uncooperative and is not interactive. Unable to complete H&P at this time. No acute distress noted. VSS, afebrile at this time. Will continue to monitor and assess this patient needs and concerns.

## 2019-02-02 NOTE — Progress Notes (Signed)
PROGRESS NOTE    Matthew Scott  WUJ:[id number] DOB: 29-Apr-1981 DOA: 02/01/2019 PCP: Hoy Register, MD     Brief Narrative:  Matthew Scott is a 38 y.o. 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  Patient has had multiple ER visits for the same and continues to smoke marijuana daily. Patient denies fevers/chills, weakness, dizziness, chest pain, shortness of breath,  dysuria/frequency, changes in mental status. In the ED, patient received capsaicin, Haldol, Zofran, NS. Medical admission has been requested for further management of cyclic vomiting syndrome.  New events last 24 hours / Subjective: States that he has vomited about 3 times since admission.  Still not feeling well.  Per nursing, he took about 6-7 showers since admission.  Assessment & Plan:   Active Problems:   Cyclic vomiting syndrome  Intractable nausea and vomiting secondary to cyclical vomiting syndrome, marijuana use -CT abdomen pelvis without acute CT finding to explain nausea, vomiting or abdominal pain -Slowly advance diet to clear liquids today -Continue IV fluids  Mild rhabdomyolysis -IV fluid -Check CK in AM    DVT prophylaxis: SCD Code Status: Full code Family Communication: None Disposition Plan: Pending improvement in oral intake and improvement in nausea and vomiting.  Hopefully discharge 6/21   Consultants:   None  Procedures:   None  Antimicrobials:  Anti-infectives (From admission, onward)   None        Objective: Vitals:   02/01/19 1830 02/01/19 1900 02/01/19 2144 02/02/19 0614  BP: (!) 153/56 (!) 174/89 (!) 157/98 (!) 159/88  Pulse: (!) 50 (!) 45 (!) 53 (!) 57  Resp: 20 19 20 20   Temp:   (!) 97.5 F (36.4 C) 98.3 F (36.8 C)  TempSrc:   Oral Oral  SpO2: 100% 100% 100% 98%  Weight:      Height:        Intake/Output Summary (Last 24 hours) at 02/02/2019 1027  Last data filed at 02/02/2019 0700 Gross per 24 hour  Intake 144.59 ml  Output -  Net 144.59 ml   Filed Weights   02/01/19 1256  Weight: 86.2 kg    Examination:  General exam: Appears calm and comfortable  Respiratory system: Clear to auscultation. Respiratory effort normal. Cardiovascular system: S1 & S2 heard, RRR. No JVD, murmurs, rubs, gallops or clicks. No pedal edema. Gastrointestinal system: Abdomen is nondistended, soft and nontender. No organomegaly or masses felt. Normal bowel sounds heard. Central nervous system: Alert and oriented. No focal neurological deficits. Extremities: Symmetric 5 x 5 power. Skin: No rashes, lesions or ulcers Psychiatry: Judgement and insight appear normal. Mood & affect appropriate.   Data Reviewed: I have personally reviewed following labs and imaging studies  CBC: Recent Labs  Lab 01/30/19 2116 02/01/19 1318 02/02/19 0509  WBC 15.6* 8.1 8.8  NEUTROABS 12.0* 6.8  --   HGB 13.6 12.2* 12.7*  HCT 41.2 37.7* 39.3  MCV 99.5 99.0 99.5  PLT 280 262 236   Basic Metabolic Panel: Recent Labs  Lab 01/30/19 2116 02/01/19 1318 02/02/19 0509  NA 142 141 139  K 4.3 3.5 4.1  CL 106 105 103  CO2 21* 25 25  GLUCOSE 122* 110* 139*  BUN 12 12 9   CREATININE 1.03 0.89 0.76  CALCIUM 10.4* 9.7 9.8  MG  --  2.0  --    GFR: Estimated Creatinine Clearance: 134.8 mL/min (by C-G formula based on SCr of 0.76 mg/dL).  Liver Function Tests: Recent Labs  Lab 01/30/19 2116 02/01/19 1318  AST 32 31  ALT 19 22  ALKPHOS 76 69  BILITOT 1.5* 1.4*  PROT 8.9* 7.8  ALBUMIN 4.9 4.6   Recent Labs  Lab 01/30/19 2116 02/01/19 1318  LIPASE 23 24   No results for input(s): AMMONIA in the last 168 hours. Coagulation Profile: No results for input(s): INR, PROTIME in the last 168 hours. Cardiac Enzymes: Recent Labs  Lab 01/30/19 2116 02/01/19 1318  CKTOTAL 407* 729*   BNP (last 3 results) No results for input(s): PROBNP in the last 8760 hours.  HbA1C: No results for input(s): HGBA1C in the last 72 hours. CBG: No results for input(s): GLUCAP in the last 168 hours. Lipid Profile: No results for input(s): CHOL, HDL, LDLCALC, TRIG, CHOLHDL, LDLDIRECT in the last 72 hours. Thyroid Function Tests: No results for input(s): TSH, T4TOTAL, FREET4, T3FREE, THYROIDAB in the last 72 hours. Anemia Panel: No results for input(s): VITAMINB12, FOLATE, FERRITIN, TIBC, IRON, RETICCTPCT in the last 72 hours. Sepsis Labs: No results for input(s): PROCALCITON, LATICACIDVEN in the last 168 hours.  Recent Results (from the past 240 hour(s))  SARS Coronavirus 2 (CEPHEID - Performed in [facility] hospital lab), Hosp Order     Status: None   Collection Time: 02/01/19  6:56 PM   Specimen: Nasopharyngeal Swab  Result Value Ref Range Status   SARS Coronavirus 2 NEGATIVE NEGATIVE Final    Comment: (NOTE) If result is NEGATIVE SARS-CoV-2 target nucleic acids are NOT DETECTED. The SARS-CoV-2 RNA is generally detectable in upper and lower  respiratory specimens during the acute phase of infection. The lowest  concentration of SARS-CoV-2 viral copies this assay can detect is 250  copies / mL. A negative result does not preclude SARS-CoV-2 infection  and should not be used as the sole basis for treatment or other  patient management decisions.  A negative result may occur with  improper specimen collection / handling, submission of specimen other  than nasopharyngeal swab, presence of viral mutation(s) within the  areas targeted by this assay, and inadequate number of viral copies  (<250 copies / mL). A negative result must be combined with clinical  observations, patient history, and epidemiological information. If result is POSITIVE SARS-CoV-2 target nucleic acids are DETECTED. The SARS-CoV-2 RNA is generally detectable in upper and lower  respiratory specimens dur ing the acute phase of infection.  Positive  results are indicative of active  infection with SARS-CoV-2.  Clinical  correlation with patient history and other diagnostic information is  necessary to determine patient infection status.  Positive results do  not rule out bacterial infection or co-infection with other viruses. If result is PRESUMPTIVE POSTIVE SARS-CoV-2 nucleic acids MAY BE PRESENT.   A presumptive positive result was obtained on the submitted specimen  and confirmed on repeat testing.  While 2019 novel coronavirus  (SARS-CoV-2) nucleic acids may be present in the submitted sample  additional confirmatory testing may be necessary for epidemiological  and / or clinical management purposes  to differentiate between  SARS-CoV-2 and other Sarbecovirus currently known to infect humans.  If clinically indicated additional testing with an alternate test  methodology [phone]) is advised. The SARS-CoV-2 RNA is generally  detectable in upper and lower respiratory sp ecimens during the acute  phase of infection. The expected result is Negative. Fact Sheet for Patients:  BoilerBrush.com.cy Fact Sheet for Healthcare Providers: https://pope.com/ This test is not yet approved or cleared by the [place]  FDA and has been authorized for detection and/or diagnosis of SARS-CoV-2 by FDA under an Emergency Use Authorization (EUA).  This EUA will remain in effect (meaning this test can be used) for the duration of the COVID-19 declaration under Section 564(b)(1) of the Act, 21 U.S.C. section 360bbb-3(b)(1), unless the authorization is terminated or revoked sooner. Performed at [facility], [address], [place], [place] [address]        Radiology Studies: Ct Abdomen Pelvis W Contrast  Result Date: 02/01/2019 CLINICAL DATA:  Nausea, vomiting, abdominal pain EXAM: CT ABDOMEN AND PELVIS WITH CONTRAST TECHNIQUE: Multidetector CT imaging of the abdomen and pelvis was performed using the standard  protocol following bolus administration of intravenous contrast. CONTRAST:  OMNIPAQUE IOHEXOL 300 MG/ML  SOLN COMPARISON:  11/04/2017 FINDINGS: Lower chest: No acute abnormality. Hepatobiliary: No focal liver abnormality is seen. Status post cholecystectomy. No biliary dilatation. Pancreas: Unremarkable. No pancreatic ductal dilatation or surrounding inflammatory changes. Spleen: Normal in size without significant abnormality. Adrenals/Urinary Tract: Adrenal glands are unremarkable. Small nonobstructive right-sided renal calculi. No hydronephrosis. Bladder is unremarkable. Stomach/Bowel: Stomach is within normal limits. Appendix appears normal. No evidence of bowel wall thickening, distention, or inflammatory changes. Vascular/Lymphatic: No significant vascular findings are present. No enlarged abdominal or pelvic lymph nodes. Reproductive: No mass or other significant abnormality. Other: No abdominal wall hernia or abnormality. Evidence of prior umbilical hernia repair. No abdominopelvic ascites. Musculoskeletal: No acute or significant osseous findings. IMPRESSION: 1. No acute CT findings of the abdomen or pelvis to explain nausea, vomiting, or abdominal pain. 2.  Status post cholecystectomy and umbilical hernia repair. 3.  Nonobstructive right nephrolithiasis. Electronically Signed   By: Lauralyn Primes M.D.   On: 02/01/2019 16:57      Scheduled Meds: Continuous Infusions: . sodium chloride       LOS: 0 days    Time spent: 35 minutes   Noralee Stain, DO [facility] [email or web address] 02/02/2019, 10:27 AM

## 2019-02-02 NOTE — Progress Notes (Signed)
Matthew Scott up to shower at this time without requesting permission,consent from staff. Disconnected his telemetry and electrodes , along with his IVfluid without staff assistance. Encouraged and instructed this patient to please ask staff for assistance so as to maintain the sterile field and to protect his safety. This patient continues to be uncooperative and not interactive.

## 2019-02-03 LAB — CK: Total CK: 689 U/L — ABNORMAL HIGH (ref 49–397)

## 2019-02-03 LAB — BASIC METABOLIC PANEL
Anion gap: 14 (ref 5–15)
BUN: 13 mg/dL (ref 6–20)
CO2: 24 mmol/L (ref 22–32)
Calcium: 10 mg/dL (ref 8.9–10.3)
Chloride: 99 mmol/L (ref 98–111)
Creatinine, Ser: 0.88 mg/dL (ref 0.61–1.24)
GFR calc Af Amer: >60 mL/min (ref >60)
GFR calc non Af Amer: >60 mL/min (ref >60)
Glucose, Bld: 116 mg/dL — ABNORMAL HIGH (ref 70–99)
Potassium: 3.7 mmol/L (ref 3.5–5.1)
Sodium: 137 mmol/L (ref 135–145)

## 2019-02-03 NOTE — Discharge Summary (Signed)
Physician Discharge Summary  Matthew Scott:[id number] DOB: Jul 23, 1981 DOA: 02/01/2019  PCP: Hoy Register, MD  Admit date: 02/01/2019 Discharge date: 02/03/2019  Admitted From: Home Disposition:  Home  Recommendations for Outpatient Follow-up:  1. Follow up with PCP in 1 week 2. 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 Patient has had multiple ER visits for the same and continues to smoke marijuana daily.Patient denies fevers/chills, weakness, dizziness, chest pain, shortness of breath, dysuria/frequency, changes in mental status. In the ED, patient received capsaicin, Haldol, Zofran, NS. Medical admission has been requested for further management ofcyclic vomiting syndrome.  He was treated with supportive care, IV fluids and slow advancement of diet.  On morning of discharge, he had taken a shower, working out on the floor in his hospital room.  He stated that he felt much better, no further nausea or vomiting, having normal bowel movements.  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   Severe tetrahydrocannabinol (THC) dependence (HCC) Mild rhabdomyolysis  Discharge Instructions  Discharge Instructions    Call MD for:  difficulty breathing, headache or visual disturbances   Complete by: As directed    Call MD for:  extreme fatigue   Complete by: As directed    Call MD for:  persistant dizziness or light-headedness   Complete by: As directed    Call MD for:  persistant nausea and vomiting   Complete by: As directed    Call MD  for:  severe uncontrolled pain   Complete by: As directed    Call MD for:  temperature >100.4   Complete by: As directed    Diet general   Complete by: As directed    Discharge instructions   Complete by: As directed    Stop all marijuana use, indefinitely.   You were cared for by a hospitalist during your hospital stay. If you have any questions about your discharge medications or the care you received while you were in the hospital after you are discharged, you can call the unit and ask to speak with the hospitalist on call if the hospitalist that took care of you is not available. Once you are discharged, your primary care physician will handle any further medical issues. Please note that NO REFILLS for any discharge medications will be authorized once you are discharged, as it is imperative that you return to your primary care physician (or establish a relationship with a primary care physician if you do not have one) for your aftercare needs so that they can reassess your need for medications and monitor your lab values.   Increase activity slowly   Complete by: As directed      Allergies as of 02/03/2019   No Known Allergies     Medication List    You have not been prescribed any medications.    Follow-up Information    Hoy Register, MD. Schedule an appointment as soon as possible for a visit in 1 week(s).   Specialty: Family Medicine Contact information: [address] [place] [address] [phone]          No Known Allergies  Consultations:  None  Procedures/Studies: Ct Abdomen Pelvis W Contrast  Result Date: 02/01/2019 CLINICAL DATA:  Nausea, vomiting, abdominal pain EXAM: CT ABDOMEN AND PELVIS WITH CONTRAST TECHNIQUE: Multidetector CT imaging of the abdomen and pelvis was performed using the standard protocol following bolus administration of intravenous contrast. CONTRAST:  OMNIPAQUE IOHEXOL 300 MG/ML  SOLN COMPARISON:  11/04/2017  FINDINGS: Lower chest: No acute abnormality. Hepatobiliary: No focal liver abnormality is seen. Status post cholecystectomy. No biliary dilatation. Pancreas: Unremarkable. No pancreatic ductal dilatation or surrounding inflammatory changes. Spleen: Normal in size without significant abnormality. Adrenals/Urinary Tract: Adrenal glands are unremarkable. Small nonobstructive right-sided renal calculi. No hydronephrosis. Bladder is unremarkable. Stomach/Bowel: Stomach is within normal limits. Appendix appears normal. No evidence of bowel wall thickening, distention, or inflammatory changes. Vascular/Lymphatic: No significant vascular findings are present. No enlarged abdominal or pelvic lymph nodes. Reproductive: No mass or other significant abnormality. Other: No abdominal wall hernia or abnormality. Evidence of prior umbilical hernia repair. No abdominopelvic ascites. Musculoskeletal: No acute or significant osseous findings. IMPRESSION: 1. No acute CT findings of the abdomen or pelvis to explain nausea, vomiting, or abdominal pain. 2.  Status post cholecystectomy and umbilical hernia repair. 3.  Nonobstructive right nephrolithiasis. Electronically Signed   By: Lauralyn Primes M.D.   On: 02/01/2019 16:57      Discharge Exam: Vitals:   02/02/19 2116 02/03/19 0629  BP: (!) 149/82 (!) 141/66  Pulse: (!) 58 (!) 54  Resp: 18 16  Temp: 99.2 F (37.3 C) 98.4 F (36.9 C)  SpO2: 98% 100%    General: Pt is alert, awake, not in acute distress Cardiovascular: RRR, S1/S2 +, no rubs, no gallops Respiratory: CTA bilaterally, no wheezing, no rhonchi Abdominal: Soft, NT, ND, bowel sounds + Extremities: no edema, no cyanosis    The results of significant diagnostics from this hospitalization (including imaging, microbiology, ancillary and laboratory) are listed below for reference.     Microbiology: Recent Results (from the past 240 hour(s))  SARS Coronavirus 2 (CEPHEID - Performed in [facility] hospital  lab), Hosp Order     Status: None   Collection Time: 02/01/19  6:56 PM   Specimen: Nasopharyngeal Swab  Result Value Ref Range Status   SARS Coronavirus 2 NEGATIVE NEGATIVE Final    Comment: (NOTE) If result is NEGATIVE SARS-CoV-2 target nucleic acids are NOT DETECTED. The SARS-CoV-2 RNA is generally detectable in upper and lower  respiratory specimens during the acute phase of infection. The lowest  concentration of SARS-CoV-2 viral copies this assay can detect is 250  copies / mL. A negative result does not preclude SARS-CoV-2 infection  and should not be used as the sole basis for treatment or other  patient management decisions.  A negative result may occur with  improper specimen collection / handling, submission of specimen other  than nasopharyngeal swab, presence of viral mutation(s) within the  areas targeted by this assay, and inadequate number of viral copies  (<250 copies / mL). A negative result must be combined with clinical  observations, patient history, and epidemiological information. If result is POSITIVE SARS-CoV-2 target nucleic acids are DETECTED. The SARS-CoV-2 RNA is generally detectable in upper and lower  respiratory specimens dur ing the acute phase of infection.  Positive  results are indicative of active infection with SARS-CoV-2.  Clinical  correlation with patient history and other diagnostic information is  necessary to determine patient infection status.  Positive results do  not rule out bacterial infection or co-infection with other viruses. If result is  PRESUMPTIVE POSTIVE SARS-CoV-2 nucleic acids MAY BE PRESENT.   A presumptive positive result was obtained on the submitted specimen  and confirmed on repeat testing.  While 2019 novel coronavirus  (SARS-CoV-2) nucleic acids may be present in the submitted sample  additional confirmatory testing may be necessary for epidemiological  and / or clinical management purposes  to differentiate between   SARS-CoV-2 and other Sarbecovirus currently known to infect humans.  If clinically indicated additional testing with an alternate test  methodology [phone]) is advised. The SARS-CoV-2 RNA is generally  detectable in upper and lower respiratory sp ecimens during the acute  phase of infection. The expected result is Negative. Fact Sheet for Patients:  BoilerBrush.com.cy Fact Sheet for Healthcare Providers: https://pope.com/ This test is not yet approved or cleared by the [place] FDA and has been authorized for detection and/or diagnosis of SARS-CoV-2 by FDA under an Emergency Use Authorization (EUA).  This EUA will remain in effect (meaning this test can be used) for the duration of the COVID-19 declaration under Section 564(b)(1) of the Act, 21 U.S.C. section 360bbb-3(b)(1), unless the authorization is terminated or revoked sooner. Performed at [facility], [address] [place], [place] [address]      Labs: BNP (last 3 results) No results for input(s): BNP in the last 8760 hours. Basic Metabolic Panel: Recent Labs  Lab 01/30/19 2116 02/01/19 1318 02/02/19 0509 02/03/19 0557  NA 142 141 139 137  K 4.3 3.5 4.1 3.7  CL 106 105 103 99  CO2 21* 25 25 24   GLUCOSE 122* 110* 139* 116*  BUN 12 12 9 13   CREATININE 1.03 0.89 0.76 0.88  CALCIUM 10.4* 9.7 9.8 10.0  MG  --  2.0  --   --    Liver Function Tests: Recent Labs  Lab 01/30/19 2116 02/01/19 1318  AST 32 31  ALT 19 22  ALKPHOS 76 69  BILITOT 1.5* 1.4*  PROT 8.9* 7.8  ALBUMIN 4.9 4.6   Recent Labs  Lab 01/30/19 2116 02/01/19 1318  LIPASE 23 24   No results for input(s): AMMONIA in the last 168 hours. CBC: Recent Labs  Lab 01/30/19 2116 02/01/19 1318 02/02/19 0509  WBC 15.6* 8.1 8.8  NEUTROABS 12.0* 6.8  --   HGB 13.6 12.2* 12.7*  HCT 41.2 37.7* 39.3  MCV 99.5 99.0 99.5  PLT 280 262 236   Cardiac Enzymes: Recent Labs  Lab  01/30/19 2116 02/01/19 1318 02/03/19 0557  CKTOTAL 407* 729* 689*   BNP: Invalid input(s): POCBNP CBG: No results for input(s): GLUCAP in the last 168 hours. D-Dimer No results for input(s): DDIMER in the last 72 hours. Hgb A1c No results for input(s): HGBA1C in the last 72 hours. Lipid Profile No results for input(s): CHOL, HDL, LDLCALC, TRIG, CHOLHDL, LDLDIRECT in the last 72 hours. Thyroid function studies No results for input(s): TSH, T4TOTAL, T3FREE, THYROIDAB in the last 72 hours.  Invalid input(s): FREET3 Anemia work up No results for input(s): VITAMINB12, FOLATE, FERRITIN, TIBC, IRON, RETICCTPCT in the last 72 hours. Urinalysis    Component Value Date/Time   COLORURINE YELLOW 02/01/2019 1856   APPEARANCEUR CLEAR 02/01/2019 1856   LABSPEC 1.028 02/01/2019 1856   PHURINE 6.0 02/01/2019 1856   GLUCOSEU 50 (A) 02/01/2019 1856   HGBUR NEGATIVE 02/01/2019 1856   BILIRUBINUR NEGATIVE 02/01/2019 1856   KETONESUR 5 (A) 02/01/2019 1856   PROTEINUR NEGATIVE 02/01/2019 1856   UROBILINOGEN 1.0 04/27/2015 1206   NITRITE NEGATIVE 02/01/2019 1856  LEUKOCYTESUR NEGATIVE 02/01/2019 1856   Sepsis Labs Invalid input(s): PROCALCITONIN,  WBC,  LACTICIDVEN Microbiology Recent Results (from the past 240 hour(s))  SARS Coronavirus 2 (CEPHEID - Performed in [facility] hospital lab), Hosp Order     Status: None   Collection Time: 02/01/19  6:56 PM   Specimen: Nasopharyngeal Swab  Result Value Ref Range Status   SARS Coronavirus 2 NEGATIVE NEGATIVE Final    Comment: (NOTE) If result is NEGATIVE SARS-CoV-2 target nucleic acids are NOT DETECTED. The SARS-CoV-2 RNA is generally detectable in upper and lower  respiratory specimens during the acute phase of infection. The lowest  concentration of SARS-CoV-2 viral copies this assay can detect is 250  copies / mL. A negative result does not preclude SARS-CoV-2 infection  and should not be used as the sole basis for treatment or other   patient management decisions.  A negative result may occur with  improper specimen collection / handling, submission of specimen other  than nasopharyngeal swab, presence of viral mutation(s) within the  areas targeted by this assay, and inadequate number of viral copies  (<250 copies / mL). A negative result must be combined with clinical  observations, patient history, and epidemiological information. If result is POSITIVE SARS-CoV-2 target nucleic acids are DETECTED. The SARS-CoV-2 RNA is generally detectable in upper and lower  respiratory specimens dur ing the acute phase of infection.  Positive  results are indicative of active infection with SARS-CoV-2.  Clinical  correlation with patient history and other diagnostic information is  necessary to determine patient infection status.  Positive results do  not rule out bacterial infection or co-infection with other viruses. If result is PRESUMPTIVE POSTIVE SARS-CoV-2 nucleic acids MAY BE PRESENT.   A presumptive positive result was obtained on the submitted specimen  and confirmed on repeat testing.  While 2019 novel coronavirus  (SARS-CoV-2) nucleic acids may be present in the submitted sample  additional confirmatory testing may be necessary for epidemiological  and / or clinical management purposes  to differentiate between  SARS-CoV-2 and other Sarbecovirus currently known to infect humans.  If clinically indicated additional testing with an alternate test  methodology [phone]) is advised. The SARS-CoV-2 RNA is generally  detectable in upper and lower respiratory sp ecimens during the acute  phase of infection. The expected result is Negative. Fact Sheet for Patients:  BoilerBrush.com.cy Fact Sheet for Healthcare Providers: https://pope.com/ This test is not yet approved or cleared by the [place] FDA and has been authorized for detection and/or diagnosis of SARS-CoV-2  by FDA under an Emergency Use Authorization (EUA).  This EUA will remain in effect (meaning this test can be used) for the duration of the COVID-19 declaration under Section 564(b)(1) of the Act, 21 U.S.C. section 360bbb-3(b)(1), unless the authorization is terminated or revoked sooner. Performed at [facility], [address] [place], [place] [address]      Patient was seen and examined on the day of discharge and was found to be in stable condition. Time coordinating discharge: 25 minutes including assessment and coordination of care, as well as examination of the patient.   SIGNED:  Noralee Stain, DO [facility] [email or web address] 02/03/2019, 10:13 AM

## 2019-02-03 NOTE — Discharge Instructions (Signed)
Cannabinoid Hyperemesis Syndrome °Cannabinoid hyperemesis syndrome (CHS) is a condition that causes repeated nausea, vomiting, and abdominal pain after long-term (chronic) use of marijuana (cannabis). People with CHS typically use marijuana 3-5 times a day for many years before they have symptoms, although it is possible to develop CHS with as little as 1 use per day. °Symptoms of CHS may be mild at first but can get worse and more frequent. In some cases, CHS may cause vomiting many times a day, which can lead to weight loss and dehydration. CHS may go away and come back many times (recur). People may not have symptoms or may otherwise be healthy in between CHS attacks. °What are the causes? °The exact cause of this condition is not known. Long-term use of marijuana may over-stimulate certain proteins in the brain that react with chemicals in marijuana (cannabinoid receptors). This over-stimulation may cause CHS. °What are the signs or symptoms? °Symptoms of this condition are often mild during the first few attacks, but they can get worse over time. Symptoms may include: °· Frequent nausea, especially early in the morning. °· Vomiting. °· Abdominal pain. °Taking several hot showers throughout the day can also be a sign of this condition. People with CHS may do this because it relieves symptoms. °How is this diagnosed? °This condition may be diagnosed based on: °· Your symptoms and medical history, including any drug use. °· A physical exam. °You may have tests done to rule out other problems. These tests may include: °· Blood tests. °· Urine tests. °· Imaging tests, such as an X-ray or CT scan. °How is this treated? °Treatment for this condition involves stopping marijuana use. Your health care provider may recommend: °· A drug rehabilitation program, if you have trouble stopping marijuana use. °· Medicines for nausea. °· Hot showers to help relieve symptoms. °Certain creams that contain a substance called  capsaicin may improve symptoms when applied to the abdomen. Ask your health care provider before starting any medicines or other treatments. °Severe nausea and vomiting may require you to stay at the hospital. You may need IV fluids to prevent or treat dehydration. You may also need certain medicines that must be given at the hospital. °Follow these instructions at home: °During an attack ° °· Stay in bed and rest in a dark, quiet room. °· Take anti-nausea medicine as told by your health care provider. °· Try taking hot showers to relieve your symptoms. °After an attack °· Drink small amounts of clear fluids slowly. Gradually add more. °· Once you are able to eat without vomiting, eat soft foods in small amounts every 3-4 hours. °General instructions ° °· Do not use any products that contain marijuana.If you need help quitting, ask your health care provider for resources and treatment options. °· Drink enough fluid to keep your urine pale yellow. Avoid drinking fluids that have a lot of sugar or caffeine, such as coffee and soda. °· Take and apply over-the-counter and prescription medicines only as told by your health care provider. Ask your health care provider before starting any new medicines or treatments. °· Keep all follow-up visits as told by your health care provider. This is important. °Contact a health care provider if: °· Your symptoms get worse. °· You cannot drink fluids without vomiting. °· You have pain and trouble swallowing after an attack. °Get help right away if: °· You cannot stop vomiting. °· You have blood in your vomit or your vomit looks like coffee grounds. °· You have   severe abdominal pain. °· You have stools that are bloody or black, or stools that look like tar. °· You have symptoms of dehydration, such as: °? Sunken eyes. °? Inability to make tears. °? Cracked lips. °? Dry mouth. °? Decreased urine production. °? Weakness. °? Sleepiness. °? Fainting. °Summary °· Cannabinoid hyperemesis  syndrome (CHS) is a condition that causes repeated nausea, vomiting, and abdominal pain after long-term use of marijuana. °· People with CHS typically use marijuana 3-5 times a day for many years before they have symptoms, although it is possible to develop CHS with as little as 1 use per day. °· Treatment for this condition involves stopping marijuana use. Hot showers and capsaicin creams may also help relieve symptoms. Ask your health care provider before starting any medicines or other treatments. °· Your health care provider may prescribe medicines to help with nausea. °· Get help right away if you have signs of dehydration, such as dry mouth, decreased urine production, or weakness. °This information is not intended to replace advice given to you by your health care provider. Make sure you discuss any questions you have with your health care provider. °Document Released: 11/09/2016 Document Revised: 11/09/2016 Document Reviewed: 11/09/2016 °Elsevier Interactive Patient Education © 2019 [employer] ° °

## 2019-03-28 ENCOUNTER — Emergency Department (HOSPITAL_COMMUNITY)
Admission: EM | Admit: 2019-03-28 | Discharge: 2019-03-29 | Disposition: A | Discharge status: Home / Self Care | Payer: Self-pay | Attending: Emergency Medicine | Admitting: Emergency Medicine

## 2019-03-28 ENCOUNTER — Other Ambulatory Visit: Payer: Self-pay

## 2019-03-28 DIAGNOSIS — F12988 Cannabis use, unspecified with other cannabis-induced disorder: Secondary | ICD-10-CM | POA: Insufficient documentation

## 2019-03-28 DIAGNOSIS — R1031 Right lower quadrant pain: Secondary | ICD-10-CM

## 2019-03-28 DIAGNOSIS — R112 Nausea with vomiting, unspecified: Secondary | ICD-10-CM

## 2019-03-28 DIAGNOSIS — F1290 Cannabis use, unspecified, uncomplicated: Secondary | ICD-10-CM

## 2019-03-28 DIAGNOSIS — I10 Essential (primary) hypertension: Secondary | ICD-10-CM | POA: Insufficient documentation

## 2019-03-28 DIAGNOSIS — F17290 Nicotine dependence, other tobacco product, uncomplicated: Secondary | ICD-10-CM | POA: Insufficient documentation

## 2019-03-28 DIAGNOSIS — R1116 Cannabis hyperemesis syndrome: Secondary | ICD-10-CM

## 2019-03-28 LAB — CBC WITH DIFFERENTIAL/PLATELET
Abs Immature Granulocytes: 0.14 10*3/uL — ABNORMAL HIGH (ref 0.00–0.07)
Basophils Absolute: 0 10*3/uL (ref 0.0–0.1)
Basophils Relative: 0 %
Eosinophils Absolute: 0 10*3/uL (ref 0.0–0.5)
Eosinophils Relative: 0 %
HCT: 43 % (ref 39.0–52.0)
Hemoglobin: 14.1 g/dL (ref 13.0–17.0)
Immature Granulocytes: 1 %
Lymphocytes Relative: 7 %
Lymphs Abs: 1.3 10*3/uL (ref 0.7–4.0)
MCH: 33 pg (ref 26.0–34.0)
MCHC: 32.8 g/dL (ref 30.0–36.0)
MCV: 100.7 fL — ABNORMAL HIGH (ref 80.0–100.0)
Monocytes Absolute: 1.1 10*3/uL — ABNORMAL HIGH (ref 0.1–1.0)
Monocytes Relative: 6 %
Neutro Abs: 16 10*3/uL — ABNORMAL HIGH (ref 1.7–7.7)
Neutrophils Relative %: 86 %
Platelets: 273 10*3/uL (ref 150–400)
RBC: 4.27 MIL/uL (ref 4.22–5.81)
RDW: 12.6 % (ref 11.5–15.5)
WBC: 18.5 10*3/uL — ABNORMAL HIGH (ref 4.0–10.5)
nRBC: 0 % (ref 0.0–0.2)

## 2019-03-28 MED ORDER — SODIUM CHLORIDE 0.9% FLUSH
3.0000 mL | Freq: Once | INTRAVENOUS | Status: AC
  Administered 2019-03-29: 3 mL via INTRAVENOUS

## 2019-03-28 MED ORDER — FENTANYL CITRATE (PF) 100 MCG/2ML IJ SOLN
50.0000 ug | Freq: Once | INTRAMUSCULAR | Status: AC
  Administered 2019-03-28: 50 ug via INTRAVENOUS
  Filled 2019-03-28: qty 2

## 2019-03-28 MED ORDER — SODIUM CHLORIDE 0.9 % IV BOLUS
1000.0000 mL | Freq: Once | INTRAVENOUS | Status: AC
  Administered 2019-03-29: 1000 mL via INTRAVENOUS

## 2019-03-29 ENCOUNTER — Emergency Department (HOSPITAL_COMMUNITY): Payer: Self-pay

## 2019-03-29 LAB — COMPREHENSIVE METABOLIC PANEL
ALT: 24 U/L (ref 0–44)
AST: 32 U/L (ref 15–41)
Albumin: 5.2 g/dL — ABNORMAL HIGH (ref 3.5–5.0)
Alkaline Phosphatase: 81 U/L (ref 38–126)
Anion gap: 16 — ABNORMAL HIGH (ref 5–15)
BUN: 18 mg/dL (ref 6–20)
CO2: 20 mmol/L — ABNORMAL LOW (ref 22–32)
Calcium: 10.5 mg/dL — ABNORMAL HIGH (ref 8.9–10.3)
Chloride: 106 mmol/L (ref 98–111)
Creatinine, Ser: 1.11 mg/dL (ref 0.61–1.24)
GFR calc Af Amer: >60 mL/min (ref >60)
GFR calc non Af Amer: >60 mL/min (ref >60)
Glucose, Bld: 169 mg/dL — ABNORMAL HIGH (ref 70–99)
Potassium: 4.3 mmol/L (ref 3.5–5.1)
Sodium: 142 mmol/L (ref 135–145)
Total Bilirubin: 1.9 mg/dL — ABNORMAL HIGH (ref 0.3–1.2)
Total Protein: 8.9 g/dL — ABNORMAL HIGH (ref 6.5–8.1)

## 2019-03-29 LAB — LIPASE, BLOOD: Lipase: 18 U/L (ref 11–51)

## 2019-03-29 MED ORDER — PROMETHAZINE HCL 25 MG RE SUPP: 25.0000 mg | Freq: Four times a day (QID) | RECTAL | 0 refills | Status: DC | PRN

## 2019-03-29 MED ORDER — IOHEXOL 300 MG/ML  SOLN
100.0000 mL | Freq: Once | INTRAMUSCULAR | Status: AC | PRN
  Administered 2019-03-29: 100 mL via INTRAVENOUS

## 2019-03-29 MED ORDER — SODIUM CHLORIDE (PF) 0.9 % IJ SOLN
INTRAMUSCULAR | Status: AC
  Filled 2019-03-29: qty 50

## 2019-03-29 MED ORDER — HALOPERIDOL LACTATE 5 MG/ML IJ SOLN
2.0000 mg | Freq: Once | INTRAMUSCULAR | Status: AC
  Administered 2019-03-29: 2 mg via INTRAVENOUS
  Filled 2019-03-29: qty 1

## 2019-03-29 MED ORDER — ONDANSETRON HCL 4 MG/2ML IJ SOLN
4.0000 mg | Freq: Once | INTRAMUSCULAR | Status: AC
  Administered 2019-03-29: 4 mg via INTRAVENOUS
  Filled 2019-03-29: qty 2

## 2019-03-29 MED ORDER — ONDANSETRON HCL 4 MG PO TABS: 4.0000 mg | ORAL_TABLET | Freq: Four times a day (QID) | ORAL | 0 refills | Status: DC

## 2019-03-29 MED ORDER — HEPARIN SOD (PORK) LOCK FLUSH 100 UNIT/ML IV SOLN
INTRAVENOUS | Status: AC
  Filled 2019-03-29: qty 5

## 2019-03-29 NOTE — ED Provider Notes (Signed)
[facility]-EMERGENCY DEPT Provider Note   CSN: [id number] Arrival date & time: 03/28/19  2248    History   Chief Complaint Chief Complaint  Patient presents with  . Abdominal Pain  . N/V    [name] is a 38 y.o. male with a history of polysubstance abuse, H. pylori, biliary dyskinesia status post cholecystectomy, cyclical vomiting syndrome, and cannabis hyperemesis syndrome who presents to the emergency department with a chief complaint of nausea, vomiting, and abdominal pain.  The patient endorses nausea and vomiting for the last 2 days accompanied by right lower quadrant pain.  Pain is constant, severe, and nonradiating.  He characterizes it as sharp.  He reports that the heat is improved by hot showers and warm compresses.  No known aggravating factors.  He reports approximately 5 episodes of nonbloody, nonbilious vomiting over the last 24 hours.  No treatment prior to arrival.    He denies fever, chills, dysuria, hematuria, penile or testicular pain or swelling, shortness of breath, chest pain, or flank pain.  Surgical history includes appendectomy.       The history is provided by the patient. No language interpreter was used.    Past Medical History:  Diagnosis Date  . 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 Chronic abdominal pain   . Cyclical vomiting syndrome   . GASTRITIS 12/09/2009   Qualifier: Diagnosis of  By: Daphine DeutscherMartin FNP, Zena AmosNykedtra    . Gastroparesis   . HELICOBACTER PYLORI INFECTION 12/14/2009   Qualifier: Diagnosis of  By: Levon Hedgerraddock, Brenda    . Nausea and vomiting    chronic, recurrent  . Polysubstance abuse [facility](HCC)     Patient Active Problem List   Diagnosis Date Noted  . 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 Intractable nausea and vomiting 08/04/2018  . Elevated AST (SGOT) 08/04/2018  .  Uncontrollable vomiting 03/10/2016  . Anemia 03/10/2016  . Metatarsal fracture 12/22/2015  . Essential hypertension 12/22/2015  . Night sweats 01/21/2015  . GERD (gastroesophageal reflux disease) 01/21/2015  . Gastritis 12/19/2014  . Hyperbilirubinemia 12/19/2014  . Elevated bilirubin   . Nausea & vomiting 12/14/2014  . Severe tetrahydrocannabinol (THC) dependence (HCC) 12/14/2014  . Alcohol abuse 12/14/2014  . Cyclic vomiting syndrome 12/06/2014  . 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 Gastroparesis 05/11/2012  . Abdominal pain 05/06/2012  . Polysubstance abuse (HCC) 05/06/2012  . CHOLECYSTECTOMY, HX OF 11/25/2009    Past Surgical History:  Procedure Laterality Date  . CHOLECYSTECTOMY  11/2009        Home Medications    Prior to Admission medications   Medication Sig Start Date End Date Taking? Authorizing Provider  ondansetron (ZOFRAN) 4 MG tablet Take 1 tablet (4 mg total) by mouth every 6 (six) hours. 03/29/19   Janah Mcculloh A, PA-C  promethazine (PHENERGAN) 25 MG suppository Place 1 suppository (25 mg total) rectally every 6 (six) hours as needed for nausea or vomiting. 03/29/19   Christoher Drudge A, PA-C    Family History Family History  Problem Relation Age of Onset  . Other Father   . Diabetes Father   . Hypertension Father     Social History Social History   Tobacco Use  . Smoking status: Current Some Day Smoker    Years: 8.00    Types: Cigars    Last attempt to  quit: 12/15/2014    Years since quitting: 4.2  . Smokeless tobacco: Never Used  Substance Use Topics  . Alcohol use: No    Alcohol/week: 0.0 standard drinks  . Drug use: Yes    Types: Marijuana    Comment: Last used: 4 days ago per patient on 04/30/2018     Allergies   Patient has no known allergies.   Review of Systems Review of Systems  Constitutional: Negative for appetite change, chills and fever.  Respiratory: Negative for shortness of breath  and wheezing.   Cardiovascular: Negative for chest pain, palpitations and leg swelling.  Gastrointestinal: Positive for abdominal pain, nausea and vomiting. Negative for anal bleeding, blood in stool, constipation, diarrhea and rectal pain.  Genitourinary: Negative for dysuria, flank pain, frequency, hematuria, penile pain, penile swelling, scrotal swelling, testicular pain and urgency.  Musculoskeletal: Negative for back pain.  Skin: Negative for rash.  Allergic/Immunologic: Negative for immunocompromised state.  Neurological: Negative for dizziness, weakness, light-headedness, numbness and headaches.  Psychiatric/Behavioral: Negative for confusion.     Physical Exam Updated Vital Signs BP (!) 155/78 (BP Location: Right Arm)   Pulse 80   Temp 97.6 F (36.4 C) (Oral)   Resp 20   Wt 86.2 kg   SpO2 100%   BMI 28.06 kg/m   Physical Exam Vitals signs and nursing note reviewed.  Constitutional:      Appearance: He is well-developed.  HENT:     Head: Normocephalic.     Mouth/Throat:     Mouth: Mucous membranes are moist.  Eyes:     Conjunctiva/sclera: Conjunctivae normal.  Neck:     Musculoskeletal: Neck supple.  Cardiovascular:     Rate and Rhythm: Normal rate and regular rhythm.     Pulses: Normal pulses.     Heart sounds: Normal heart sounds. No murmur. No friction rub. No gallop.   Pulmonary:     Effort: Pulmonary effort is normal. No respiratory distress.     Breath sounds: No stridor. No wheezing, rhonchi or rales.  Chest:     Chest wall: No tenderness.  Abdominal:     General: There is no distension.     Palpations: Abdomen is soft. There is no mass.     Tenderness: There is abdominal tenderness. There is no right CVA tenderness, left CVA tenderness, guarding or rebound.     Hernia: No hernia is present.     Comments: Diffusely tender to palpation throughout the abdomen, but maximally tender in the right lower quadrant.  He is tender to palpation over McBurney's  point.  No rebound or guarding.  No CVA tenderness bilaterally.  Hyperactive bowel sounds in all 4 quadrants.  Skin:    General: Skin is warm and dry.     Capillary Refill: Capillary refill takes less than 2 seconds.     Coloration: Skin is not jaundiced.  Neurological:     Mental Status: He is alert.  Psychiatric:        Behavior: Behavior normal.      ED Treatments / Results  Labs (all labs ordered are listed, but only abnormal results are displayed) Labs Reviewed  COMPREHENSIVE METABOLIC PANEL - Abnormal; Notable for the following components:      Result Value   CO2 20 (*)    Glucose, Bld 169 (*)    Calcium 10.5 (*)    Total Protein 8.9 (*)    Albumin 5.2 (*)    Total Bilirubin 1.9 (*)    Anion gap  16 (*)    All other components within normal limits  CBC WITH DIFFERENTIAL/PLATELET - Abnormal; Notable for the following components:   WBC 18.5 (*)    MCV 100.7 (*)    Neutro Abs 16.0 (*)    Monocytes Absolute 1.1 (*)    Abs Immature Granulocytes 0.14 (*)    All other components within normal limits  LIPASE, BLOOD    EKG EKG Interpretation  Date/Time:  Friday March 29 2019 00:25:52 EDT Ventricular Rate:  61 PR Interval:    QRS Duration: 96 QT Interval:  396 QTC Calculation: 399 R Axis:   64 Text Interpretation:  Sinus rhythm Atrial premature complex ST elevation, consider early repolarization No significant change since last tracing Confirmed by Rochele RaringWard, Kristen [phone]([id number]) on 03/29/2019 12:30:23 AM Also confirmed by Ward, Baxter HireKristen [phone]([id number]), editor Barbette Hairassel, Kerry [phone]([id number])  on 03/29/2019 7:03:44 AM   Radiology Ct Abdomen Pelvis W Contrast  Result Date: 03/29/2019 CLINICAL DATA:  38 year old male with abdominal pain, nausea vomiting. Concern for acute appendicitis. EXAM: CT ABDOMEN AND PELVIS WITH CONTRAST TECHNIQUE: Multidetector CT imaging of the abdomen and pelvis was performed using the standard protocol following bolus administration of intravenous contrast. CONTRAST:  100mL  OMNIPAQUE IOHEXOL 300 MG/ML  SOLN COMPARISON:  CT of the abdomen pelvis dated 02/01/2019 FINDINGS: Evaluation of this exam is limited due to respiratory motion artifact. Lower chest: The visualized lung bases are clear. No intra-abdominal free air or free fluid. Hepatobiliary: Several subcentimeter hepatic hypodense foci are too small to characterize. There is no intrahepatic biliary ductal dilatation. Cholecystectomy. No retained calcified stone noted in the central CBD. Pancreas: Unremarkable. No pancreatic ductal dilatation or surrounding inflammatory changes. Spleen: Normal in size without focal abnormality. Adrenals/Urinary Tract: The adrenal glands are unremarkable. There is a 5 mm nonobstructing right renal upper pole calculus. No hydronephrosis. The left kidney is unremarkable. The urinary bladder is grossly unremarkable as visualized. Stomach/Bowel: Evaluation of the bowel is limited in the absence of oral contrast. There is no bowel obstruction or active inflammation. The appendix is normal as visualized. Vascular/Lymphatic: No significant vascular findings are present. No enlarged abdominal or pelvic lymph nodes. Reproductive: The prostate and seminal vesicles are grossly unremarkable. No pelvic mass. Other: Umbilical hernia repair. Musculoskeletal: No acute or significant osseous findings. IMPRESSION: 1. No acute intra-abdominal or pelvic pathology. No bowel obstruction or active inflammation. Normal appendix. 2. A 5 mm nonobstructing right renal upper pole calculus. No hydronephrosis. Electronically Signed   By: Elgie CollardArash  Radparvar M.D.   On: 03/29/2019 02:55    Procedures Procedures (including critical care time)  Medications Ordered in ED Medications  sodium chloride flush (NS) 0.9 % injection 3 mL (3 mLs Intravenous Given 03/29/19 0155)  fentaNYL (SUBLIMAZE) injection 50 mcg (50 mcg Intravenous Given 03/28/19 2321)  sodium chloride 0.9 % bolus 1,000 mL (0 mLs Intravenous Stopped 03/29/19 0115)   haloperidol lactate (HALDOL) injection 2 mg (2 mg Intravenous Given 03/29/19 0153)  ondansetron (ZOFRAN) injection 4 mg (4 mg Intravenous Given 03/29/19 0153)  iohexol (OMNIPAQUE) 300 MG/ML solution 100 mL (100 mLs Intravenous Contrast Given 03/29/19 0225)     Initial Impression / Assessment and Plan / ED Course  I have reviewed the triage vital signs and the nursing notes.  Pertinent labs & imaging results that were available during my care of the patient were reviewed by me and considered in my medical decision making (see chart for details).        38 year old male with a history of polysubstance  abuse, H. pylori, biliary dyskinesia status post cholecystectomy, cyclical vomiting syndrome, and cannabis hyperemesis syndrome presenting with right lower quadrant pain, nausea, and NBNB vomiting for the last 2 days.  No constitutional symptoms.  On exam, he is diffusely tender to palpation throughout the abdomen, but is endorsing right lower quadrant pain and maximal tenderness is in the right lower quadrant over McBurney's point.  The patient does still have his appendix.  Although he has a history of cyclical vomiting and does report that his symptoms over the last 2 days have been significantly improved with warm compresses and hot showers, which are more consistent with cyclical vomiting and cannabis hyperemesis syndrome, given new leukocytosis of 18 with right lower quadrant pain, CT abdomen pelvis was ordered, which was negative for acute findings.  Labs arousable notable for minimally decreased bicarb of 20 and anion gap of 16, treated with IV fluids.  He does appear to have mildly elevated calcium of 10.5, but I suspect this is secondary to hemoconcentration his total protein, creatinine, and bilirubin are also minimally elevated from previous.  EKG with QTC of 399 so the patient was given Haldol for pain since he does not have prolonged QTC and Zofran for vomiting.  On reevaluation, the patient  was no longer writhing on the bed.  He was sleeping comfortably and had tolerated p.o. fluids without difficulty.  He reports that he is feeling much better and would like to be discharged.  We will discharge with antiemetics.  Cannabis abstinence was recommended.  Return precautions the ER given.  He is hemodynamically stable and in no acute distress.  Safe for discharge home with outpatient follow-up.  Final Clinical Impressions(s) / ED Diagnoses   Final diagnoses:  Non-intractable vomiting with nausea, unspecified vomiting type  RLQ abdominal pain  Cannabinoid hyperemesis syndrome [facility])    ED Discharge Orders         Ordered    promethazine (PHENERGAN) 25 MG suppository  Every 6 hours PRN     03/29/19 0432    ondansetron (ZOFRAN) 4 MG tablet  Every 6 hours     03/29/19 0432           Joline Maxcy A, PA-C 03/29/19 0851    Ward, Delice Bison, DO 03/29/19 2308

## 2019-03-29 NOTE — Discharge Instructions (Addendum)
Thank you for allowing me to care for you today in the Emergency Department. .  Your symptoms may continue to recur if you continue to smoke marijuana.   You were dehydrated today and treated with IV fluid. Continue to drink at least 64 ounces of water daily.   You can take one tablet of Zofran by mouth every 6 hours for nausea and vomiting or place one phenergan suppository by rectum every 6 hours for nausea or vomiting.   Return to the ER if you develop vomiting despite taking zofran or phenergan, if you stop making urine, develop a fever and severe abdominal pain, or new, concerning symptoms.

## 2019-03-29 NOTE — ED Notes (Signed)
Patient tolerated PO Fluids.

## 2019-04-01 ENCOUNTER — Emergency Department (HOSPITAL_COMMUNITY)
Admission: EM | Admit: 2019-04-01 | Discharge: 2019-04-01 | Disposition: A | Discharge status: Home / Self Care | Payer: Self-pay | Attending: Emergency Medicine | Admitting: Emergency Medicine

## 2019-04-01 ENCOUNTER — Emergency Department (HOSPITAL_COMMUNITY): Payer: Self-pay

## 2019-04-01 ENCOUNTER — Other Ambulatory Visit: Payer: Self-pay

## 2019-04-01 DIAGNOSIS — F1290 Cannabis use, unspecified, uncomplicated: Secondary | ICD-10-CM

## 2019-04-01 DIAGNOSIS — F17200 Nicotine dependence, unspecified, uncomplicated: Secondary | ICD-10-CM | POA: Insufficient documentation

## 2019-04-01 DIAGNOSIS — R112 Nausea with vomiting, unspecified: Secondary | ICD-10-CM | POA: Insufficient documentation

## 2019-04-01 DIAGNOSIS — F12188 Cannabis abuse with other cannabis-induced disorder: Secondary | ICD-10-CM | POA: Insufficient documentation

## 2019-04-01 LAB — URINALYSIS, ROUTINE W REFLEX MICROSCOPIC
Bilirubin Urine: NEGATIVE
Glucose, UA: 50 mg/dL — AB
Hgb urine dipstick: NEGATIVE
Ketones, ur: 5 mg/dL — AB
Leukocytes,Ua: NEGATIVE
Nitrite: NEGATIVE
Protein, ur: NEGATIVE mg/dL
Specific Gravity, Urine: 1.024 (ref 1.005–1.030)
pH: 5 (ref 5.0–8.0)

## 2019-04-01 LAB — CBC WITH DIFFERENTIAL/PLATELET
Abs Immature Granulocytes: 0.05 10*3/uL (ref 0.00–0.07)
Basophils Absolute: 0 10*3/uL (ref 0.0–0.1)
Basophils Relative: 0 %
Eosinophils Absolute: 0 10*3/uL (ref 0.0–0.5)
Eosinophils Relative: 0 %
HCT: 39.3 % (ref 39.0–52.0)
Hemoglobin: 12.7 g/dL — ABNORMAL LOW (ref 13.0–17.0)
Immature Granulocytes: 0 %
Lymphocytes Relative: 6 %
Lymphs Abs: 0.7 10*3/uL (ref 0.7–4.0)
MCH: 32.9 pg (ref 26.0–34.0)
MCHC: 32.3 g/dL (ref 30.0–36.0)
MCV: 101.8 fL — ABNORMAL HIGH (ref 80.0–100.0)
Monocytes Absolute: 0.4 10*3/uL (ref 0.1–1.0)
Monocytes Relative: 4 %
Neutro Abs: 10.2 10*3/uL — ABNORMAL HIGH (ref 1.7–7.7)
Neutrophils Relative %: 90 %
Platelets: 240 10*3/uL (ref 150–400)
RBC: 3.86 MIL/uL — ABNORMAL LOW (ref 4.22–5.81)
RDW: 12.2 % (ref 11.5–15.5)
WBC: 11.4 10*3/uL — ABNORMAL HIGH (ref 4.0–10.5)
nRBC: 0 % (ref 0.0–0.2)

## 2019-04-01 LAB — COMPREHENSIVE METABOLIC PANEL
ALT: 34 U/L (ref 0–44)
AST: 68 U/L — ABNORMAL HIGH (ref 15–41)
Albumin: 4.7 g/dL (ref 3.5–5.0)
Alkaline Phosphatase: 71 U/L (ref 38–126)
Anion gap: 18 — ABNORMAL HIGH (ref 5–15)
BUN: 14 mg/dL (ref 6–20)
CO2: 21 mmol/L — ABNORMAL LOW (ref 22–32)
Calcium: 9.9 mg/dL (ref 8.9–10.3)
Chloride: 105 mmol/L (ref 98–111)
Creatinine, Ser: 1.04 mg/dL (ref 0.61–1.24)
GFR calc Af Amer: >60 mL/min (ref >60)
GFR calc non Af Amer: >60 mL/min (ref >60)
Glucose, Bld: 131 mg/dL — ABNORMAL HIGH (ref 70–99)
Potassium: 4.1 mmol/L (ref 3.5–5.1)
Sodium: 144 mmol/L (ref 135–145)
Total Bilirubin: 1.4 mg/dL — ABNORMAL HIGH (ref 0.3–1.2)
Total Protein: 8 g/dL (ref 6.5–8.1)

## 2019-04-01 LAB — LIPASE, BLOOD: Lipase: 22 U/L (ref 11–51)

## 2019-04-01 MED ORDER — ONDANSETRON 8 MG PO TBDP: ORAL_TABLET | ORAL | 0 refills | Status: DC

## 2019-04-01 MED ORDER — HYDROMORPHONE HCL 1 MG/ML IJ SOLN
1.0000 mg | Freq: Once | INTRAMUSCULAR | Status: AC
  Administered 2019-04-01: 1 mg via INTRAVENOUS
  Filled 2019-04-01: qty 1

## 2019-04-01 MED ORDER — KETOROLAC TROMETHAMINE 30 MG/ML IJ SOLN
30.0000 mg | Freq: Once | INTRAMUSCULAR | Status: AC
  Administered 2019-04-01: 30 mg via INTRAVENOUS
  Filled 2019-04-01: qty 1

## 2019-04-01 MED ORDER — MORPHINE SULFATE (PF) 4 MG/ML IV SOLN
4.0000 mg | Freq: Once | INTRAVENOUS | Status: AC
  Administered 2019-04-01: 4 mg via INTRAVENOUS
  Filled 2019-04-01: qty 1

## 2019-04-01 MED ORDER — SODIUM CHLORIDE (PF) 0.9 % IJ SOLN
INTRAMUSCULAR | Status: AC
  Administered 2019-04-01: 10 mL
  Filled 2019-04-01: qty 50

## 2019-04-01 MED ORDER — SODIUM CHLORIDE 0.9 % IV BOLUS
1000.0000 mL | Freq: Once | INTRAVENOUS | Status: AC
  Administered 2019-04-01: 1000 mL via INTRAVENOUS

## 2019-04-01 MED ORDER — IOHEXOL 300 MG/ML  SOLN
100.0000 mL | Freq: Once | INTRAMUSCULAR | Status: AC | PRN
  Administered 2019-04-01: 100 mL via INTRAVENOUS

## 2019-04-01 MED ORDER — ONDANSETRON HCL 4 MG/2ML IJ SOLN
4.0000 mg | Freq: Once | INTRAMUSCULAR | Status: AC
  Administered 2019-04-01: 4 mg via INTRAVENOUS
  Filled 2019-04-01: qty 2

## 2019-04-01 NOTE — Discharge Instructions (Addendum)
Zofran as prescribed as needed for nausea.  Clear liquid diet for the next 12 hours, then advance as tolerated.  Return to the emergency department if symptoms significantly worsen or change.

## 2019-04-01 NOTE — ED Provider Notes (Signed)
[place] DEPT Provider Note   CSN: [id number] Arrival date & time: 04/01/19  1036     History   Chief Complaint No chief complaint on file.   [name] is a 38 y.o. male.     Patient is a 37 year old male with past medical history of cyclic vomiting, chronic abdominal pain, polysubstance abuse, prior cholecystectomy.  He presents today for evaluation of severe abdominal pain.  He was seen here 3 days ago with similar complaints, however no definitive cause was found.  He was given medications and ultimately discharged.  Pain returned this morning.  He describes severe pain across his mid abdomen with nausea and vomiting.  He denies fevers or chills.  The history is provided by the patient.    Past Medical History:  Diagnosis Date  . 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 Chronic abdominal pain   . Cyclical vomiting syndrome   . GASTRITIS 12/09/2009   Qualifier: Diagnosis of  By: Hassell Done FNP, Tori Milks    . Gastroparesis   . HELICOBACTER PYLORI INFECTION 12/14/2009   Qualifier: Diagnosis of  By: Isla Pence    . Nausea and vomiting    chronic, recurrent  . Polysubstance abuse [facility])     Patient Active Problem List   Diagnosis Date Noted  . 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 Cannabis hyperemesis syndrome concurrent with and due to cannabis abuse ([place]) 08/06/2018  . Intractable nausea and vomiting 08/04/2018  . Elevated AST (SGOT) 08/04/2018  . Uncontrollable vomiting 03/10/2016  . Anemia 03/10/2016  . Metatarsal fracture 12/22/2015  . Essential hypertension 12/22/2015  . Night sweats 01/21/2015  . GERD (gastroesophageal reflux disease) 01/21/2015  . Gastritis 12/19/2014  . Hyperbilirubinemia 12/19/2014  . Elevated bilirubin   . Nausea & vomiting 12/14/2014  . Severe tetrahydrocannabinol (THC) dependence ([place]) 12/14/2014  . Alcohol abuse 12/14/2014  . Cyclic vomiting syndrome  12/06/2014  . 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 Gastroparesis 05/11/2012  . Abdominal pain 05/06/2012  . Polysubstance abuse ([place]) 05/06/2012  . CHOLECYSTECTOMY, HX OF 11/25/2009    Past Surgical History:  Procedure Laterality Date  . CHOLECYSTECTOMY  11/2009        Home Medications    Prior to Admission medications   Medication Sig Start Date End Date Taking? Authorizing Provider  ondansetron (ZOFRAN) 4 MG tablet Take 1 tablet (4 mg total) by mouth every 6 (six) hours. 03/29/19   McDonald, Mia A, PA-C  promethazine (PHENERGAN) 25 MG suppository Place 1 suppository (25 mg total) rectally every 6 (six) hours as needed for nausea or vomiting. 03/29/19   McDonald, Mia A, PA-C    Family History Family History  Problem Relation Age of Onset  . Other Father   . Diabetes Father   . Hypertension Father     Social History Social History   Tobacco Use  . Smoking status: Current Some Day Smoker    Years: 8.00    Types: Cigars    Last attempt to quit: 12/15/2014    Years since quitting: 4.2  . Smokeless tobacco: Never Used  Substance Use Topics  . Alcohol use: No    Alcohol/week: 0.0 standard drinks  . Drug use: Yes    Types: Marijuana    Comment: Last used: 4 days ago per patient on 04/30/2018     Allergies   Patient has no known allergies.   Review of Systems  Review of Systems  All other systems reviewed and are negative.    Physical Exam Updated Vital Signs There were no vitals taken for this visit.  Physical Exam Vitals signs and nursing note reviewed.  Constitutional:      General: He is not in acute distress.    Appearance: He is well-developed. He is ill-appearing and diaphoretic.     Comments: Patient appears uncomfortable and is actively retching.  HENT:     Head: Normocephalic and atraumatic.  Neck:     Musculoskeletal: Normal range of motion and neck supple.  Cardiovascular:     Rate and Rhythm: Normal  rate and regular rhythm.     Heart sounds: No murmur. No friction rub.  Pulmonary:     Effort: Pulmonary effort is normal. No respiratory distress.     Breath sounds: Normal breath sounds. No wheezing or rales.  Abdominal:     General: Bowel sounds are normal. There is no distension.     Palpations: Abdomen is soft.     Tenderness: There is generalized abdominal tenderness. There is guarding. There is no right CVA tenderness, left CVA tenderness or rebound.  Musculoskeletal: Normal range of motion.  Skin:    General: Skin is warm.  Neurological:     Mental Status: He is alert and oriented to person, place, and time.     Coordination: Coordination normal.      ED Treatments / Results  Labs (all labs ordered are listed, but only abnormal results are displayed) Labs Reviewed  COMPREHENSIVE METABOLIC PANEL  CBC WITH DIFFERENTIAL/PLATELET  LIPASE, BLOOD  URINALYSIS, ROUTINE W REFLEX MICROSCOPIC    EKG None  Radiology No results found.  Procedures Procedures (including critical care time)  Medications Ordered in ED Medications  sodium chloride 0.9 % bolus 1,000 mL (has no administration in time range)  ondansetron (ZOFRAN) injection 4 mg (has no administration in time range)  morphine 4 MG/ML injection 4 mg (has no administration in time range)  ketorolac (TORADOL) 30 MG/ML injection 30 mg (has no administration in time range)     Initial Impression / Assessment and Plan / ED Course  I have reviewed the triage vital signs and the nursing notes.  Pertinent labs & imaging results that were available during my care of the patient were reviewed by me and considered in my medical decision making (see chart for details).  Patient is a 38 year old male with history of chronic abdominal pain and gastroparesis/cannabis hyperemesis.  He presents today with severe abdominal pain and vomiting.  He was seen 3 days ago with similar complaints and work-up was unremarkable.  Patient  arrived here in severe pain, diaphoretic, and ill-appearing.  He was given IV fluids and medications and is now feeling better.  He is tolerating soda without difficulty.  At this point, patient appears appropriate for discharge.  Imaging studies including CT of his abdomen were negative again today.  He will be discharged with Zofran and return PRN.  Final Clinical Impressions(s) / ED Diagnoses   Final diagnoses:  None    ED Discharge Orders    None       Geoffery Lyonselo, Kosei Rhodes, MD 04/01/19 1432

## 2019-04-01 NOTE — ED Triage Notes (Addendum)
BIB Ems Pt reports intermitted abd pain since having gallbladder out 7 years. Pt reports "flare up" 2 days ago and was seen here for it and states had some relief however pain and n/v was severe this morring   100 Fentanyl  And 4 of Zofran given by EMS

## 2019-04-03 ENCOUNTER — Encounter (HOSPITAL_COMMUNITY): Payer: Self-pay | Admitting: Emergency Medicine

## 2019-04-03 ENCOUNTER — Other Ambulatory Visit: Payer: Self-pay

## 2019-04-03 ENCOUNTER — Emergency Department (HOSPITAL_COMMUNITY)
Admission: EM | Admit: 2019-04-03 | Discharge: 2019-04-03 | Disposition: A | Discharge status: Home / Self Care | Payer: Self-pay | Attending: Emergency Medicine | Admitting: Emergency Medicine

## 2019-04-03 DIAGNOSIS — Z79899 Other long term (current) drug therapy: Secondary | ICD-10-CM | POA: Insufficient documentation

## 2019-04-03 DIAGNOSIS — I10 Essential (primary) hypertension: Secondary | ICD-10-CM | POA: Insufficient documentation

## 2019-04-03 DIAGNOSIS — F17200 Nicotine dependence, unspecified, uncomplicated: Secondary | ICD-10-CM | POA: Insufficient documentation

## 2019-04-03 DIAGNOSIS — R1084 Generalized abdominal pain: Secondary | ICD-10-CM

## 2019-04-03 DIAGNOSIS — R112 Nausea with vomiting, unspecified: Secondary | ICD-10-CM | POA: Insufficient documentation

## 2019-04-03 MED ORDER — DICYCLOMINE HCL 20 MG PO TABS: 20.0000 mg | ORAL_TABLET | Freq: Two times a day (BID) | ORAL | 0 refills | Status: DC

## 2019-04-03 NOTE — ED Triage Notes (Addendum)
Per EMS, patient c/o abdominal pain with N/V since 0630 after drinking apple juice. Seen for same x2 days ago.  18g L AC 39mcg Fentanyl with EMS 4mg  Zofran  BP 120/61 HR 72 RR 16 O2 97% RA

## 2019-04-03 NOTE — ED Notes (Signed)
Patient tolerating PO fluids 

## 2019-04-03 NOTE — ED Notes (Signed)
Patient given soda. 

## 2019-04-03 NOTE — Discharge Instructions (Addendum)
Please return for any problem.  Follow-up with your regular care provider as instructed. °

## 2019-04-03 NOTE — ED Notes (Signed)
ED Provider at bedside. 

## 2019-04-03 NOTE — ED Provider Notes (Signed)
[facility]-EMERGENCY DEPT Provider Note   CSN: [id number] Arrival date & time: 04/03/19  1441     History   Chief Complaint Chief Complaint  Patient presents with  . Abdominal Pain  . Emesis    [name] is a 38 y.o. male.     38 year old male with prior medical history as detailed below presents for evaluation of reported abdominal pain with associated nausea vomiting.  Patient reports acute onset of diffuse abdominal discomfort earlier today.  This was associated with nausea vomiting.  Eventually symptoms became so uncomfortable that he called EMS.  EMS is transported the patient to the ED.  Upon arrival to the ED he reports he feels significantly better.  He denies any significant abdominal pain at this time.  He denies associated nausea at this time.  He desires discharge.  Of note, patient has had 2 similar presentations earlier this week for similar complaint.  His work-up on both occasions was negative.  Work-up included screening labs and CT abdomen pelvis.  There were no acute findings on labs or CT.  Patient has longstanding history of cyclic vomiting and chronic abdominal pain.  The history is provided by the patient and medical records.  Abdominal Pain Pain location:  Generalized Pain quality: aching and cramping   Pain radiates to:  Does not radiate Pain severity:  No pain Onset quality:  Sudden Timing:  Intermittent Progression:  Waxing and waning Chronicity:  Chronic Relieved by:  Nothing Worsened by:  Nothing Associated symptoms: vomiting   Emesis Associated symptoms: abdominal pain     Past Medical History:  Diagnosis Date  . 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 Chronic abdominal pain   . Cyclical vomiting syndrome   . GASTRITIS 12/09/2009   Qualifier: Diagnosis of  By: Daphine DeutscherMartin FNP, Zena AmosNykedtra    . Gastroparesis   . HELICOBACTER PYLORI INFECTION 12/14/2009   Qualifier:  Diagnosis of  By: Levon Hedgerraddock, Brenda    . Nausea and vomiting    chronic, recurrent  . Polysubstance abuse [facility])     Patient Active Problem List   Diagnosis Date Noted  . 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 Intractable nausea and vomiting 08/04/2018  . Elevated AST (SGOT) 08/04/2018  . Uncontrollable vomiting 03/10/2016  . Anemia 03/10/2016  . Metatarsal fracture 12/22/2015  . Essential hypertension 12/22/2015  . Night sweats 01/21/2015  . GERD (gastroesophageal reflux disease) 01/21/2015  . Gastritis 12/19/2014  . Hyperbilirubinemia 12/19/2014  . Elevated bilirubin   . Nausea & vomiting 12/14/2014  . Severe tetrahydrocannabinol (THC) dependence ([facility]) 12/14/2014  . Alcohol abuse 12/14/2014  . Cyclic vomiting syndrome 12/06/2014  . 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 Gastroparesis 05/11/2012  . Abdominal pain 05/06/2012  . Polysubstance abuse ([facility]) 05/06/2012  . CHOLECYSTECTOMY, HX OF 11/25/2009    Past Surgical History:  Procedure Laterality Date  . CHOLECYSTECTOMY  11/2009        Home Medications    Prior to Admission medications   Medication Sig Start Date End Date Taking? Authorizing Provider  dicyclomine (BENTYL) 20 MG tablet Take 1 tablet (20 mg total) by mouth 2 (two) times daily. 04/03/19   Wynetta FinesMessick, Catilyn Boggus C, MD  ondansetron (ZOFRAN ODT) 8 MG disintegrating tablet 8mg  ODT q4 hours prn nausea 04/01/19   Geoffery Lyonselo, Douglas, MD  ondansetron (ZOFRAN) 4 MG  tablet Take 1 tablet (4 mg total) by mouth every 6 (six) hours. 03/29/19   McDonald, Mia A, PA-C  promethazine (PHENERGAN) 25 MG suppository Place 1 suppository (25 mg total) rectally every 6 (six) hours as needed for nausea or vomiting. 03/29/19   McDonald, Mia A, PA-C    Family History Family History  Problem Relation Age of Onset  . Other Father   . Diabetes Father   . Hypertension Father      Social History Social History   Tobacco Use  . Smoking status: Current Some Day Smoker    Years: 8.00    Types: Cigars    Last attempt to quit: 12/15/2014    Years since quitting: 4.3  . Smokeless tobacco: Never Used  Substance Use Topics  . Alcohol use: No    Alcohol/week: 0.0 standard drinks  . Drug use: Yes    Types: Marijuana    Comment: Last used: 4 days ago per patient on 04/30/2018     Allergies   Patient has no known allergies.   Review of Systems Review of Systems  Gastrointestinal: Positive for abdominal pain and vomiting.  All other systems reviewed and are negative.    Physical Exam Updated Vital Signs BP 114/65   Pulse 71   Temp 98.4 F (36.9 C)   Resp 16   SpO2 95%   Physical Exam Vitals signs and nursing note reviewed.  Constitutional:      General: He is not in acute distress.    Appearance: He is well-developed.  HENT:     Head: Normocephalic and atraumatic.  Eyes:     Conjunctiva/sclera: Conjunctivae normal.     Pupils: Pupils are equal, round, and reactive to light.  Neck:     Musculoskeletal: Normal range of motion and neck supple.  Cardiovascular:     Rate and Rhythm: Normal rate and regular rhythm.     Heart sounds: Normal heart sounds.  Pulmonary:     Effort: Pulmonary effort is normal. No respiratory distress.     Breath sounds: Normal breath sounds.  Abdominal:     General: There is no distension.     Palpations: Abdomen is soft.     Tenderness: There is no abdominal tenderness.  Musculoskeletal: Normal range of motion.        General: No deformity.  Skin:    General: Skin is warm and dry.  Neurological:     General: No focal deficit present.     Mental Status: He is alert and oriented to person, place, and time.      ED Treatments / Results  Labs (all labs ordered are listed, but only abnormal results are displayed) Labs Reviewed - No data to display  EKG None  Radiology No results found.  Procedures  Procedures (including critical care time)  Medications Ordered in ED Medications - No data to display   Initial Impression / Assessment and Plan / ED Course  I have reviewed the triage vital signs and the nursing notes.  Pertinent labs & imaging results that were available during my care of the patient were reviewed by me and considered in my medical decision making (see chart for details).        MDM  Screen complete  Matthew Scott was evaluated in Emergency Department on 04/03/2019 for the symptoms described in the history of present illness. He was evaluated in the context of the global COVID-19 pandemic, which necessitated consideration that the patient might be at  risk for infection with the SARS-CoV-2 virus that causes COVID-19. Institutional protocols and algorithms that pertain to the evaluation of patients at risk for COVID-19 are in a state of rapid change based on information released by regulatory bodies including the CDC and federal and state organizations. These policies and algorithms were followed during the patient's care in the ED.   Patient is presenting for evaluation of reported abdominal discomfort with associated nausea and vomiting.  Symptoms are chronic in nature.  His symptoms have completely resolved at the time of his ED evaluation.  He has had 2 very thorough evaluations earlier this week at the same facility.  Work-ups were negative.  He declines repeat evaluation today.  He specifically declines labs, imaging studies, or other treatment.  He desires discharge.  Patient does understand the need for close follow-up.  Strict return precautions given and understood.  Final Clinical Impressions(s) / ED Diagnoses   Final diagnoses:  Nausea and vomiting, intractability of vomiting not specified, unspecified vomiting type  Generalized abdominal pain    ED Discharge Orders         Ordered    dicyclomine (BENTYL) 20 MG tablet  2 times daily      04/03/19 1504           Valarie Merino, MD 04/03/19 1507

## 2019-04-03 NOTE — ED Notes (Signed)
IV removed from LAC 

## 2019-04-04 ENCOUNTER — Encounter (HOSPITAL_COMMUNITY): Payer: Self-pay

## 2019-04-04 ENCOUNTER — Emergency Department (HOSPITAL_COMMUNITY)
Admission: EM | Admit: 2019-04-04 | Discharge: 2019-04-04 | Disposition: A | Discharge status: Home / Self Care | Payer: Self-pay | Attending: Emergency Medicine | Admitting: Emergency Medicine

## 2019-04-04 ENCOUNTER — Other Ambulatory Visit: Payer: Self-pay

## 2019-04-04 DIAGNOSIS — F17200 Nicotine dependence, unspecified, uncomplicated: Secondary | ICD-10-CM | POA: Insufficient documentation

## 2019-04-04 DIAGNOSIS — R1084 Generalized abdominal pain: Secondary | ICD-10-CM

## 2019-04-04 DIAGNOSIS — R112 Nausea with vomiting, unspecified: Secondary | ICD-10-CM | POA: Insufficient documentation

## 2019-04-04 MED ORDER — ALUM & MAG HYDROXIDE-SIMETH 200-200-20 MG/5ML PO SUSP
30.0000 mL | Freq: Once | ORAL | Status: AC
  Administered 2019-04-04: 30 mL via ORAL
  Filled 2019-04-04: qty 30

## 2019-04-04 MED ORDER — SUCRALFATE 1 G PO TABS: 1.0000 g | ORAL_TABLET | Freq: Three times a day (TID) | ORAL | 0 refills | Status: DC

## 2019-04-04 MED ORDER — LIDOCAINE VISCOUS HCL 2 % MT SOLN
15.0000 mL | Freq: Once | OROMUCOSAL | Status: AC
  Administered 2019-04-04: 15 mL via ORAL
  Filled 2019-04-04: qty 15

## 2019-04-04 NOTE — Discharge Instructions (Signed)
Please read and follow all provided instructions.  Your diagnoses today include:  1. Generalized abdominal pain   2. Nausea and vomiting, intractability of vomiting not specified, unspecified vomiting type     Tests performed today include:  Vital signs. See below for your results today.   Medications prescribed:   None  Take any prescribed medications only as directed.  Home care instructions:   Follow any educational materials contained in this packet.  Follow-up instructions: Please follow-up with your primary care provider in the next 2 days for further evaluation of your symptoms.    Return instructions:  SEEK IMMEDIATE MEDICAL ATTENTION IF:  The pain does not go away or becomes severe   A temperature above 101F develops   Repeated vomiting occurs (multiple episodes)   The pain becomes localized to portions of the abdomen. The right side could possibly be appendicitis. In an adult, the left lower portion of the abdomen could be colitis or diverticulitis.   Blood is being passed in stools or vomit (bright red or black tarry stools)   You develop chest pain, difficulty breathing, dizziness or fainting, or become confused, poorly responsive, or inconsolable (young children)  If you have any other emergent concerns regarding your health  Additional Information: Abdominal (belly) pain can be caused by many things. Your caregiver performed an examination and possibly ordered blood/urine tests and imaging (CT scan, x-rays, ultrasound). Many cases can be observed and treated at home after initial evaluation in the emergency department. Even though you are being discharged home, abdominal pain can be unpredictable. Therefore, you need a repeated exam if your pain does not resolve, returns, or worsens. Most patients with abdominal pain don't have to be admitted to the hospital or have surgery, but serious problems like appendicitis and gallbladder attacks can start out as  nonspecific pain. Many abdominal conditions cannot be diagnosed in one visit, so follow-up evaluations are very important.  Your vital signs today were: BP 132/76    Pulse 71    Temp 98.1 F (36.7 C) (Oral)    Resp 16    Ht 5\' 7"  (1.702 m)    Wt 81.6 kg    SpO2 98%    BMI 28.19 kg/m  If your blood pressure (bp) was elevated above 135/85 this visit, please have this repeated by your doctor within one month. --------------

## 2019-04-04 NOTE — ED Provider Notes (Signed)
[facility]-EMERGENCY DEPT Provider Note   CSN: [id number] Arrival date & time: 04/04/19  0808     History   Chief Complaint Chief Complaint  Patient presents with  . Abdominal Pain    [name] is a 38 y.o. male.     Patient with history of cholecystectomy, chronic abdominal pain, gastroparesis, cyclical vomiting --presents to the emergency department today for the fourth time in the past week with abdominal pain, nausea and vomiting.  Patient states that his symptoms are worse in the morning.  Pain is in the upper abdomen.  Patient was seen earlier in the week and had a negative CT scan.  His lab work is been unremarkable.  Symptoms today are the same as previous.  EMS was called and they administered Zofran and fentanyl.  This resolved the patient's pain and he is now asymptomatic.  He has been prescribed Zofran, Phenergan, Bentyl earlier in the week but states that he cannot afford to obtain his medications.  Denies any fevers, chest pain, shortness of breath.  No urinary symptoms.  Onset of symptoms acute.  Course is resolved.  Nothing makes symptoms worse.     Past Medical History:  Diagnosis Date  . 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 Chronic abdominal pain   . Cyclical vomiting syndrome   . GASTRITIS 12/09/2009   Qualifier: Diagnosis of  By: Daphine DeutscherMartin FNP, Zena AmosNykedtra    . Gastroparesis   . HELICOBACTER PYLORI INFECTION 12/14/2009   Qualifier: Diagnosis of  By: Levon Hedgerraddock, Brenda    . Nausea and vomiting    chronic, recurrent  . Polysubstance abuse [facility](HCC)     Patient Active Problem List   Diagnosis Date Noted  . 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 Intractable nausea and vomiting 08/04/2018  . Elevated AST (SGOT) 08/04/2018  . Uncontrollable vomiting 03/10/2016  . Anemia 03/10/2016  . Metatarsal fracture  12/22/2015  . Essential hypertension 12/22/2015  . Night sweats 01/21/2015  . GERD (gastroesophageal reflux disease) 01/21/2015  . Gastritis 12/19/2014  . Hyperbilirubinemia 12/19/2014  . Elevated bilirubin   . Nausea & vomiting 12/14/2014  . Severe tetrahydrocannabinol (THC) dependence (HCC) 12/14/2014  . Alcohol abuse 12/14/2014  . Cyclic vomiting syndrome 12/06/2014  . 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 Gastroparesis 05/11/2012  . Abdominal pain 05/06/2012  . Polysubstance abuse (HCC) 05/06/2012  . CHOLECYSTECTOMY, HX OF 11/25/2009    Past Surgical History:  Procedure Laterality Date  . CHOLECYSTECTOMY  11/2009        Home Medications    Prior to Admission medications   Medication Sig Start Date End Date Taking? Authorizing Provider  dicyclomine (BENTYL) 20 MG tablet Take 1 tablet (20 mg total) by mouth 2 (two) times daily. 04/03/19   Wynetta FinesMessick, Peter C, MD  ondansetron (ZOFRAN ODT) 8 MG disintegrating tablet 8mg  ODT q4 hours prn nausea 04/01/19   Geoffery Lyonselo, Douglas, MD  ondansetron (ZOFRAN) 4 MG tablet Take 1 tablet (4 mg total) by mouth every 6 (six) hours. 03/29/19   McDonald, Mia A, PA-C  promethazine (PHENERGAN) 25 MG suppository Place 1 suppository (25 mg total) rectally every 6 (six) hours as needed for nausea or vomiting. 03/29/19   McDonald, Mia A, PA-C  sucralfate (CARAFATE) 1 g tablet Take 1 tablet (1 g total) by mouth 4 (four) times daily -  with meals and at bedtime. 04/04/19   Carlisle Cater, PA-C    Family History Family History  Problem Relation Age of Onset  . Other Father   . Diabetes Father   . Hypertension Father     Social History Social History   Tobacco Use  . Smoking status: Current Some Day Smoker    Years: 8.00    Types: Cigars    Last attempt to quit: 12/15/2014    Years since quitting: 4.3  . Smokeless tobacco: Never Used  Substance Use Topics  . Alcohol use: No    Alcohol/week: 0.0 standard drinks  .  Drug use: Yes    Types: Marijuana    Comment: Last used: 4 days ago per patient on 04/30/2018     Allergies   Patient has no known allergies.   Review of Systems Review of Systems  Constitutional: Negative for fever.  HENT: Negative for rhinorrhea and sore throat.   Eyes: Negative for redness.  Respiratory: Negative for cough.   Cardiovascular: Negative for chest pain.  Gastrointestinal: Positive for abdominal pain, nausea and vomiting. Negative for diarrhea.  Genitourinary: Negative for dysuria.  Musculoskeletal: Negative for myalgias.  Skin: Negative for rash.  Neurological: Negative for headaches.     Physical Exam Updated Vital Signs BP 132/76   Pulse 71   Temp 98.1 F (36.7 C) (Oral)   Resp 16   Ht 5\' 7"  (1.702 m)   Wt 81.6 kg   SpO2 98%   BMI 28.19 kg/m   Physical Exam Vitals signs and nursing note reviewed.  Constitutional:      Appearance: He is well-developed.  HENT:     Head: Normocephalic and atraumatic.  Eyes:     General:        Right eye: No discharge.        Left eye: No discharge.     Conjunctiva/sclera: Conjunctivae normal.  Neck:     Musculoskeletal: Normal range of motion and neck supple.  Cardiovascular:     Rate and Rhythm: Normal rate and regular rhythm.     Heart sounds: Normal heart sounds.  Pulmonary:     Effort: Pulmonary effort is normal.     Breath sounds: Normal breath sounds.  Abdominal:     Palpations: Abdomen is soft.     Tenderness: There is no abdominal tenderness. There is no guarding or rebound.  Skin:    General: Skin is warm and dry.  Neurological:     Mental Status: He is alert.      ED Treatments / Results  Labs (all labs ordered are listed, but only abnormal results are displayed) Labs Reviewed - No data to display  EKG None  Radiology No results found.  Procedures Procedures (including critical care time)  Medications Ordered in ED Medications  alum & mag hydroxide-simeth (MAALOX/MYLANTA)  200-200-20 MG/5ML suspension 30 mL (has no administration in time range)    And  lidocaine (XYLOCAINE) 2 % viscous mouth solution 15 mL (has no administration in time range)     Initial Impression / Assessment and Plan / ED Course  I have reviewed the triage vital signs and the nursing notes.  Pertinent labs & imaging results that were available during my care of the patient were reviewed by me and considered in my medical decision making (see chart for details).        Patient seen and examined.  Reviewed work-up from previous visits.  Patient symptoms are now resolved and there  are no objective findings concerning for emergent pathology on exam.  Patient be given a prescription for Carafate as this may be more inexpensive.  Encourage PCP follow-up.  Will discharge.  Patient is asking for something to drink.  Vital signs reviewed and are as follows: BP 132/76   Pulse 71   Temp 98.1 F (36.7 C) (Oral)   Resp 16   Ht 5\' 7"  (1.702 m)   Wt 81.6 kg   SpO2 98%   BMI 28.19 kg/m   The patient was urged to return to the Emergency Department immediately with worsening of current symptoms, worsening abdominal pain, persistent vomiting, blood noted in stools, fever, or any other concerns. The patient verbalized understanding.    Final Clinical Impressions(s) / ED Diagnoses   Final diagnoses:  Generalized abdominal pain  Nausea and vomiting, intractability of vomiting not specified, unspecified vomiting type   Patient with chronic abdominal pain, nausea and vomiting.  Work-up this week included labs and imaging which were negative.  Patient returns with similar symptoms, now resolved after treatment during EMS ride to the emergency department.  No indications for further work-up at this time.  His exam is benign.  Encourage PCP follow-up, treatment at home.  ED Discharge Orders         Ordered    sucralfate (CARAFATE) 1 g tablet  3 times daily with meals & bedtime     04/04/19 0943            Renne CriglerGeiple, Adolfo Granieri, PA-C 04/04/19 [id number]    Benjiman CorePickering, Nathan, MD 04/04/19 [phone]

## 2019-04-04 NOTE — ED Triage Notes (Signed)
EMS reports from home, c/o acute abdominal pain this morning. Multiple ER visits for same.  BP 128/67 HR 68 RR 18 Sp02 97 RA CBG 128  18ga LAC  153mcg Fenanyl 4mg  Zofran enroute

## 2019-04-05 ENCOUNTER — Encounter (HOSPITAL_COMMUNITY): Payer: Self-pay | Admitting: Emergency Medicine

## 2019-04-05 ENCOUNTER — Emergency Department (HOSPITAL_COMMUNITY)
Admission: EM | Admit: 2019-04-05 | Discharge: 2019-04-05 | Disposition: A | Discharge status: Home / Self Care | Payer: Self-pay | Attending: Emergency Medicine | Admitting: Emergency Medicine

## 2019-04-05 DIAGNOSIS — G8929 Other chronic pain: Secondary | ICD-10-CM | POA: Insufficient documentation

## 2019-04-05 DIAGNOSIS — I10 Essential (primary) hypertension: Secondary | ICD-10-CM | POA: Insufficient documentation

## 2019-04-05 DIAGNOSIS — F17210 Nicotine dependence, cigarettes, uncomplicated: Secondary | ICD-10-CM | POA: Insufficient documentation

## 2019-04-05 DIAGNOSIS — R109 Unspecified abdominal pain: Secondary | ICD-10-CM | POA: Insufficient documentation

## 2019-04-05 DIAGNOSIS — R1115 Cyclical vomiting syndrome unrelated to migraine: Secondary | ICD-10-CM | POA: Insufficient documentation

## 2019-04-05 MED ORDER — DICYCLOMINE HCL 20 MG PO TABS: 20.0000 mg | ORAL_TABLET | Freq: Two times a day (BID) | ORAL | 0 refills | Status: DC

## 2019-04-05 MED ORDER — ONDANSETRON HCL 4 MG PO TABS: 4.0000 mg | ORAL_TABLET | Freq: Four times a day (QID) | ORAL | 0 refills | Status: DC

## 2019-04-05 MED ORDER — LIDOCAINE VISCOUS HCL 2 % MT SOLN
15.0000 mL | Freq: Once | OROMUCOSAL | Status: AC
  Administered 2019-04-05: 15 mL via ORAL
  Filled 2019-04-05: qty 15

## 2019-04-05 MED ORDER — ALUM & MAG HYDROXIDE-SIMETH 200-200-20 MG/5ML PO SUSP
30.0000 mL | Freq: Once | ORAL | Status: AC
  Administered 2019-04-05: 10:00:00 30 mL via ORAL
  Filled 2019-04-05: qty 30

## 2019-04-05 MED ORDER — PROMETHAZINE HCL 25 MG RE SUPP: 25.0000 mg | Freq: Four times a day (QID) | RECTAL | 0 refills | Status: DC | PRN

## 2019-04-05 MED ORDER — DICYCLOMINE HCL 10 MG PO CAPS
10.0000 mg | ORAL_CAPSULE | Freq: Once | ORAL | Status: AC
  Administered 2019-04-05: 10 mg via ORAL
  Filled 2019-04-05: qty 1

## 2019-04-05 MED ORDER — SUCRALFATE 1 G PO TABS: 1.0000 g | ORAL_TABLET | Freq: Three times a day (TID) | ORAL | 0 refills | Status: DC

## 2019-04-05 MED FILL — ONDANSETRON HCL 4 MG TABLET: 4 | 3 days supply | Qty: 12 | Fill #0

## 2019-04-05 MED FILL — SUCRALFATE 1 GM TABLET: 1 | 15 days supply | Qty: 60 | Fill #0

## 2019-04-05 MED FILL — PROMETHAZINE HCL 25 MG SUPP: 25 | 3 days supply | Qty: 12 | Fill #0

## 2019-04-05 MED FILL — DICYCLOMINE 20 MG TABLET: 20 | 10 days supply | Qty: 20 | Fill #0

## 2019-04-05 NOTE — ED Triage Notes (Signed)
Per EMS-chronic abdominal pain with N/V-was seen yesterday for same complaints-complaining of right sided abdominal pain-was 50 mcg of Fentanyl given in route-states pain relieved-

## 2019-04-05 NOTE — TOC Initial Note (Addendum)
Transition of Care [facility]) - Initial/Assessment Note    Patient Details  Name: Matthew Scott MRN: [id number] Date of Birth: 1980/08/26  Transition of Care [facility]) CM/SW Contact:    Elliot Cousin, RN Phone Number:  [phone] 04/05/2019, 1:01 PM  Clinical Narrative:                 Spoke to pt and states he has reoccurring nausea and vomiting that has interfered with him getting a job. He currently does not have finances to pay for meds. MATCH explained with once per year use and $3 copay. Copay override for meds due to current compromised living and no job currently. Pt had 8 ED visits/2 IP admissions in past six months. Will arrange [facility] appt to establish with a PCP. Will call pt with appt time, verified phone number. Provided pt with outpt resources for drug rehab programs. Pt states he really wants to secure a job.   Appt scheduled for Sept 3 at 2:10 pm, provided pt with information.   Expected Discharge Plan: Home/Self Care Barriers to Discharge: No Barriers Identified   Patient Goals and CMS Choice        Expected Discharge Plan and Services Expected Discharge Plan: Home/Self Care In-house Referral: PCP / [facility] Discharge Planning Services: CM Consult, MATCH Program, Medication Assistance, Follow-up appt scheduled   Living arrangements for the past 2 months: Apartment                                      Prior Living Arrangements/Services Living arrangements for the past 2 months: Apartment Lives with:: Significant Other Patient language and need for interpreter reviewed:: Yes Do you feel safe going back to the place where you live?: Yes      Need for Family Participation in Patient Care: No (Comment) Care giver support system in place?: No (comment)   Criminal Activity/Legal Involvement Pertinent to Current Situation/Hospitalization: No - Comment as needed  Activities of Daily Living      Permission Sought/Granted Permission sought  to share information with : Case Manager, Other (comment) Permission granted to share information with : Yes, Verbal Permission Granted              Emotional Assessment Appearance:: Appears stated age Attitude/Demeanor/Rapport: Engaged Affect (typically observed): Accepting Orientation: : Oriented to Self, Oriented to Place, Oriented to  Time, Oriented to Situation   Psych Involvement: No (comment)  Admission diagnosis:  abdominal pain Patient Active Problem List   Diagnosis Date Noted  . 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 Intractable nausea and vomiting 08/04/2018  . Elevated AST (SGOT) 08/04/2018  . Uncontrollable vomiting 03/10/2016  . Anemia 03/10/2016  . Metatarsal fracture 12/22/2015  . Essential hypertension 12/22/2015  . Night sweats 01/21/2015  . GERD (gastroesophageal reflux disease) 01/21/2015  . Gastritis 12/19/2014  . Hyperbilirubinemia 12/19/2014  . Elevated bilirubin   . Nausea & vomiting 12/14/2014  . Severe tetrahydrocannabinol (THC) dependence (HCC) 12/14/2014  . Alcohol abuse 12/14/2014  . Cyclic vomiting syndrome 12/06/2014  . 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 Gastroparesis 05/11/2012  . Abdominal pain 05/06/2012  . Polysubstance abuse (HCC) 05/06/2012  . CHOLECYSTECTOMY, HX OF 11/25/2009   PCP:  Hoy Register, MD Pharmacy:   [facility] - [place], [place] - [address]  [facility] [address] [place] [address] Phone: [phone] Fax: [phone]     Social Determinants of Health (SDOH) Interventions    Readmission Risk Interventions No flowsheet data found.

## 2019-04-05 NOTE — ED Provider Notes (Signed)
[place] DEPT Provider Note   CSN: [id number] Arrival date & time: 04/05/19  0910     History   Chief Complaint Chief Complaint  Patient presents with  . Abdominal Pain    [name] is a 38 y.o. male with history of chronic abdominal pain, gastritis, cyclical vomiting, cannabis hyperemesis who presents with abdominal pain that is typical of his normal.  He reports waking up with it this morning.  He vomited a couple times.  He called EMS.  They gave 50 mcg of fentanyl.  Patient is feeling much better.  He states a little bit of spasm in his abdomen, however otherwise he is feeling fine.  He has not been eating much lately because he has been having pain every day and he does not know what to eat.  He reports he has not had a bowel movement in 2 days, but denies any bloody stools.  Patient has been seen 4 times in the last week and had a negative CT abdomen pelvis on 04/01/2019.  His labs that day were also fairly reassuring.  Patient has been seen twice since that time and had pretty much resolved pain on arrival.  Patient continues to use cannabis, however states he has been cutting back and does not use it every day.  Patient denies any fever, chest pain, shortness of breath, urinary symptoms.     HPI  Past Medical History:  Diagnosis Date  . 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 Chronic abdominal pain   . Cyclical vomiting syndrome   . GASTRITIS 12/09/2009   Qualifier: Diagnosis of  By: Hassell Done FNP, Tori Milks    . Gastroparesis   . HELICOBACTER PYLORI INFECTION 12/14/2009   Qualifier: Diagnosis of  By: Isla Pence    . Nausea and vomiting    chronic, recurrent  . Polysubstance abuse [facility])     Patient Active Problem List   Diagnosis Date Noted  . 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 Cannabis hyperemesis syndrome concurrent with and due to cannabis abuse ([place]) 08/06/2018  . Intractable  nausea and vomiting 08/04/2018  . Elevated AST (SGOT) 08/04/2018  . Uncontrollable vomiting 03/10/2016  . Anemia 03/10/2016  . Metatarsal fracture 12/22/2015  . Essential hypertension 12/22/2015  . Night sweats 01/21/2015  . GERD (gastroesophageal reflux disease) 01/21/2015  . Gastritis 12/19/2014  . Hyperbilirubinemia 12/19/2014  . Elevated bilirubin   . Nausea & vomiting 12/14/2014  . Severe tetrahydrocannabinol (THC) dependence ([place]) 12/14/2014  . Alcohol abuse 12/14/2014  . Cyclic vomiting syndrome 12/06/2014  . 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 Gastroparesis 05/11/2012  . Abdominal pain 05/06/2012  . Polysubstance abuse ([place]) 05/06/2012  . CHOLECYSTECTOMY, HX OF 11/25/2009    Past Surgical History:  Procedure Laterality Date  . CHOLECYSTECTOMY  11/2009        Home Medications    Prior to Admission medications   Medication Sig Start Date End Date Taking? Authorizing Provider  dicyclomine (BENTYL) 20 MG tablet Take 1 tablet (20 mg total) by mouth 2 (two) times daily. 04/05/19   Frederica Kuster, PA-C  ondansetron (ZOFRAN ODT) 8 MG disintegrating tablet 8mg  ODT q4 hours prn nausea 04/01/19   Veryl Speak, MD  ondansetron (ZOFRAN) 4 MG tablet Take 1 tablet (4 mg total) by mouth every 6 (six) hours. 04/05/19   Harrietta Incorvaia, Bea Graff, PA-C  promethazine (PHENERGAN) 25 MG suppository Place 1 suppository (  25 mg total) rectally every 6 (six) hours as needed for nausea or vomiting. 04/05/19   Omolara Carol, Waylan BogaAlexandra M, PA-C  sucralfate (CARAFATE) 1 g tablet Take 1 tablet (1 g total) by mouth 4 (four) times daily -  with meals and at bedtime. 04/05/19   Emi HolesLaw, Tami Barren M, PA-C    Family History Family History  Problem Relation Age of Onset  . Other Father   . Diabetes Father   . Hypertension Father     Social History Social History   Tobacco Use  . Smoking status: Current Some Day Smoker    Years: 8.00    Types: Cigars    Last attempt to quit:  12/15/2014    Years since quitting: 4.3  . Smokeless tobacco: Never Used  Substance Use Topics  . Alcohol use: No    Alcohol/week: 0.0 standard drinks  . Drug use: Yes    Types: Marijuana    Comment: Last used: 4 days ago per patient on 04/30/2018     Allergies   Patient has no known allergies.   Review of Systems Review of Systems  Constitutional: Negative for chills and fever.  HENT: Negative for facial swelling and sore throat.   Respiratory: Negative for shortness of breath.   Cardiovascular: Negative for chest pain.  Gastrointestinal: Positive for abdominal pain, constipation (2 days), nausea and vomiting. Negative for diarrhea.  Genitourinary: Negative for dysuria.  Musculoskeletal: Negative for back pain.  Skin: Negative for rash and wound.  Neurological: Negative for headaches.  Psychiatric/Behavioral: The patient is not nervous/anxious.      Physical Exam Updated Vital Signs BP 118/80 (BP Location: Left Arm)   Pulse 74   Temp 98.2 F (36.8 C) (Oral)   Resp 16   SpO2 100%   Physical Exam Vitals signs and nursing note reviewed.  Constitutional:      General: He is not in acute distress.    Appearance: He is well-developed. He is not diaphoretic.     Comments: Sitting comfortably on the stretcher on his phone  HENT:     Head: Normocephalic and atraumatic.     Mouth/Throat:     Pharynx: No oropharyngeal exudate.  Eyes:     General: No scleral icterus.       Right eye: No discharge.        Left eye: No discharge.     Conjunctiva/sclera: Conjunctivae normal.     Pupils: Pupils are equal, round, and reactive to light.  Neck:     Musculoskeletal: Normal range of motion and neck supple.     Thyroid: No thyromegaly.  Cardiovascular:     Rate and Rhythm: Normal rate and regular rhythm.     Heart sounds: Normal heart sounds. No murmur. No friction rub. No gallop.   Pulmonary:     Effort: Pulmonary effort is normal. No respiratory distress.     Breath sounds:  Normal breath sounds. No stridor. No wheezing or rales.  Abdominal:     General: Bowel sounds are normal. There is no distension.     Palpations: Abdomen is soft.     Tenderness: There is no abdominal tenderness. There is no guarding or rebound.  Lymphadenopathy:     Cervical: No cervical adenopathy.  Skin:    General: Skin is warm and dry.     Coloration: Skin is not pale.     Findings: No rash.  Neurological:     Mental Status: He is alert.     Coordination: Coordination  normal.      ED Treatments / Results  Labs (all labs ordered are listed, but only abnormal results are displayed) Labs Reviewed - No data to display  EKG None  Radiology No results found.  Procedures Procedures (including critical care time)  Medications Ordered in ED Medications  dicyclomine (BENTYL) capsule 10 mg (10 mg Oral Given 04/05/19 1028)  alum & mag hydroxide-simeth (MAALOX/MYLANTA) 200-200-20 MG/5ML suspension 30 mL (30 mLs Oral Given 04/05/19 1028)    And  lidocaine (XYLOCAINE) 2 % viscous mouth solution 15 mL (15 mLs Oral Given 04/05/19 1028)     Initial Impression / Assessment and Plan / ED Course  I have reviewed the triage vital signs and the nursing notes.  Pertinent labs & imaging results that were available during my care of the patient were reviewed by me and considered in my medical decision making (see chart for details).        Patient presenting with chronic abdominal pain and nausea and vomiting.  This is his fifth visit in the last week.  He has been unable to pay for his medications that have been prescribed at the last visits.  He reports he has been waking up every morning with the pain.  He is feeling much better on arrival.  He has a benign abdominal exam.  He had a largely negative work-up a few days ago.  He is tolerating oral fluids.  Do not feel that repeat work-up indicated today as symptoms have been the same the past several days.  [facility] was consulted  [facility]  He she has also arranged for patient to follow-up with the PCP.  Return precautions discussed.  Patient understands and agrees with plan.  Patient vitals stable throughout ED course and discharged in satisfactory condition.  Final Clinical Impressions(s) / ED Diagnoses   Final diagnoses:  Chronic abdominal pain  Cyclical vomiting syndrome not associated with migraine    ED Discharge Orders         Ordered    dicyclomine (BENTYL) 20 MG tablet  2 times daily     04/05/19 1232    ondansetron (ZOFRAN) 4 MG tablet  Every 6 hours,   Status:  Discontinued     04/05/19 1232    promethazine (PHENERGAN) 25 MG suppository  Every 6 hours PRN,   Status:  Discontinued     04/05/19 1232    sucralfate (CARAFATE) 1 g tablet  3 times daily with meals & bedtime     04/05/19 1232    ondansetron (ZOFRAN) 4 MG tablet  Every 6 hours     04/05/19 1234    promethazine (PHENERGAN) 25 MG suppository  Every 6 hours PRN     04/05/19 1234           Emi HolesLaw, Christoffer Currier M, PA-C 04/05/19 1354    Gerhard MunchLockwood, Robert, MD 04/08/19 2309

## 2019-04-05 NOTE — Discharge Instructions (Addendum)
You can take Zofran OR Phenergan every 6 hours as needed for nausea or vomiting.  Do not take both at once.  Take Bentyl twice daily as needed for abdominal cramping.  Take Carafate 4 times daily before eating and at bedtime.  Begin with clear liquids.  If you are tolerating that, begin with bland foods such as bananas, rice, applesauce, toast.  Progress your diet as tolerated.  Avoid greasy, fatty, spicy, fried foods.  Avoid alcohol and marijuana.  Please return to the emergency department if you develop any new or worsening symptoms.  Please establish care with the primary care provider below.  They will call with your appointment time.  It is important that you follow through with this.

## 2019-04-06 ENCOUNTER — Encounter (HOSPITAL_COMMUNITY): Payer: Self-pay | Admitting: *Deleted

## 2019-04-06 ENCOUNTER — Emergency Department (HOSPITAL_COMMUNITY)
Admission: EM | Admit: 2019-04-06 | Discharge: 2019-04-06 | Disposition: A | Discharge status: Home / Self Care | Payer: Self-pay | Attending: Emergency Medicine | Admitting: Emergency Medicine

## 2019-04-06 ENCOUNTER — Other Ambulatory Visit: Payer: Self-pay

## 2019-04-06 DIAGNOSIS — I10 Essential (primary) hypertension: Secondary | ICD-10-CM | POA: Insufficient documentation

## 2019-04-06 DIAGNOSIS — Z79899 Other long term (current) drug therapy: Secondary | ICD-10-CM | POA: Insufficient documentation

## 2019-04-06 DIAGNOSIS — G8929 Other chronic pain: Secondary | ICD-10-CM | POA: Insufficient documentation

## 2019-04-06 DIAGNOSIS — F17290 Nicotine dependence, other tobacco product, uncomplicated: Secondary | ICD-10-CM | POA: Insufficient documentation

## 2019-04-06 DIAGNOSIS — R109 Unspecified abdominal pain: Secondary | ICD-10-CM

## 2019-04-06 DIAGNOSIS — R1084 Generalized abdominal pain: Secondary | ICD-10-CM | POA: Insufficient documentation

## 2019-04-06 LAB — I-STAT CHEM 8, ED
BUN: 11 mg/dL (ref 6–20)
Calcium, Ion: 1.2 mmol/L (ref 1.15–1.40)
Chloride: 102 mmol/L (ref 98–111)
Creatinine, Ser: 1 mg/dL (ref 0.61–1.24)
Glucose, Bld: 91 mg/dL (ref 70–99)
HCT: 39 % (ref 39.0–52.0)
Hemoglobin: 13.3 g/dL (ref 13.0–17.0)
Potassium: 4.5 mmol/L (ref 3.5–5.1)
Sodium: 139 mmol/L (ref 135–145)
TCO2: 27 mmol/L (ref 22–32)

## 2019-04-06 NOTE — Discharge Instructions (Signed)
Take your prescriptions as directed.  Increase your fluid intake (ie:  Gatoraide) for the next few days.  Eat a bland diet and advance to your regular diet slowly as you can tolerate it.  Call your regular medical doctor Monday to schedule a follow up appointment this week.  Return to the Emergency Department immediately sooner if worsening.

## 2019-04-06 NOTE — ED Provider Notes (Signed)
[facility]-EMERGENCY DEPT Provider Note   CSN: [id number] Arrival date & time: 04/06/19  1553     History   Chief Complaint Chief Complaint  Patient presents with  . Abdominal Pain    HPI Pauline AusWillie E Cantu is a 38 y.o. male.     HPI  Pt was seen at 1750.  Per pt, c/o gradual onset and improvement of one episode of acute flair of his chronic generalized abd "pain" since earlier today.  Has been associated with multiple intermittent episodes of N/V.  Describes the abd pain as per his usual chronic pain and symptoms pattern. Pt states he took "all my meds" before he called EMS and "got better while I waited in the waiting room." Pt states his symptoms are completely improved.  Denies diarrhea, no fevers, no back pain, no rash, no CP/SOB, no black or blood in stools or emesis. The symptoms have been associated with no other complaints. The patient has a significant history of similar symptoms previously, recently being evaluated for this complaint and multiple prior evals for same. This is pt's 6th ED visit this month for this complaint, with last visit yesterday.         Past Medical History:  Diagnosis Date  . 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 Chronic abdominal pain   . Cyclical vomiting syndrome   . GASTRITIS 12/09/2009   Qualifier: Diagnosis of  By: Daphine DeutscherMartin FNP, Zena AmosNykedtra    . Gastroparesis   . HELICOBACTER PYLORI INFECTION 12/14/2009   Qualifier: Diagnosis of  By: Levon Hedgerraddock, Brenda    . Nausea and vomiting    chronic, recurrent  . Polysubstance abuse [facility](HCC)     Patient Active Problem List   Diagnosis Date Noted  . 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 Intractable nausea and vomiting 08/04/2018  . Elevated AST (SGOT) 08/04/2018  . Uncontrollable vomiting 03/10/2016  . Anemia 03/10/2016  . Metatarsal fracture 12/22/2015   . Essential hypertension 12/22/2015  . Night sweats 01/21/2015  . GERD (gastroesophageal reflux disease) 01/21/2015  . Gastritis 12/19/2014  . Hyperbilirubinemia 12/19/2014  . Elevated bilirubin   . Nausea & vomiting 12/14/2014  . Severe tetrahydrocannabinol (THC) dependence (HCC) 12/14/2014  . Alcohol abuse 12/14/2014  . Cyclic vomiting syndrome 12/06/2014  . 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 Gastroparesis 05/11/2012  . Abdominal pain 05/06/2012  . Polysubstance abuse (HCC) 05/06/2012  . CHOLECYSTECTOMY, HX OF 11/25/2009    Past Surgical History:  Procedure Laterality Date  . CHOLECYSTECTOMY  11/2009        Home Medications    Prior to Admission medications   Medication Sig Start Date End Date Taking? Authorizing Provider  dicyclomine (BENTYL) 20 MG tablet Take 1 tablet (20 mg total) by mouth 2 (two) times daily. 04/05/19   Emi HolesLaw, Alexandra M, PA-C  ondansetron (ZOFRAN ODT) 8 MG disintegrating tablet 8mg  ODT q4 hours prn nausea 04/01/19   Geoffery Lyonselo, Douglas, MD  ondansetron (ZOFRAN) 4 MG tablet Take 1 tablet (4 mg total) by mouth every 6 (six) hours. 04/05/19   Law, Waylan BogaAlexandra M, PA-C  promethazine (PHENERGAN) 25 MG suppository Place 1 suppository (25 mg total) rectally every 6 (six) hours as needed for nausea or vomiting. 04/05/19   Law, Waylan BogaAlexandra M, PA-C  sucralfate (CARAFATE) 1 g tablet Take 1 tablet (1 g total) by mouth 4 (four)  times daily -  with meals and at bedtime. 04/05/19   Frederica Kuster, PA-C    Family History Family History  Problem Relation Age of Onset  . Other Father   . Diabetes Father   . Hypertension Father     Social History Social History   Tobacco Use  . Smoking status: Current Some Day Smoker    Years: 8.00    Types: Cigars    Last attempt to quit: 12/15/2014    Years since quitting: 4.3  . Smokeless tobacco: Never Used  Substance Use Topics  . Alcohol use: No    Alcohol/week: 0.0 standard drinks  . Drug  use: Yes    Types: Marijuana    Comment: Last used: 4 days ago per patient on 04/30/2018     Allergies   Patient has no known allergies.   Review of Systems Review of Systems ROS: Statement: All systems negative except as marked or noted in the HPI; Constitutional: Negative for fever and chills. ; ; Eyes: Negative for eye pain, redness and discharge. ; ; ENMT: Negative for ear pain, hoarseness, nasal congestion, sinus pressure and sore throat. ; ; Cardiovascular: Negative for chest pain, palpitations, diaphoresis, dyspnea and peripheral edema. ; ; Respiratory: Negative for cough, wheezing and stridor. ; ; Gastrointestinal: +resolved chronic abd pain, N/V. Negative for diarrhea, blood in stool, hematemesis, jaundice and rectal bleeding. . ; ; Genitourinary: Negative for dysuria, flank pain and hematuria. ; ; Musculoskeletal: Negative for back pain and neck pain. Negative for swelling and trauma.; ; Skin: Negative for pruritus, rash, abrasions, blisters, bruising and skin lesion.; ; Neuro: Negative for headache, lightheadedness and neck stiffness. Negative for weakness, altered level of consciousness, altered mental status, extremity weakness, paresthesias, involuntary movement, seizure and syncope.       Physical Exam Updated Vital Signs BP 139/75 (BP Location: Left Arm)   Pulse 80   Temp 99.1 F (37.3 C) (Oral)   Resp 18   Wt 81.6 kg   SpO2 97%   BMI 28.19 kg/m   Physical Exam 1755: Physical examination:  Nursing notes reviewed; Vital signs and O2 SAT reviewed;  Constitutional: Well developed, Well nourished, Well hydrated, In no acute distress; Head:  Normocephalic, atraumatic; Eyes: EOMI, PERRL, No scleral icterus; ENMT: Mouth and pharynx normal, Mucous membranes moist; Neck: Supple, Full range of motion, No lymphadenopathy; Cardiovascular: Regular rate and rhythm, No gallop; Respiratory: Breath sounds clear & equal bilaterally, No wheezes.  Speaking full sentences with ease, Normal  respiratory effort/excursion; Chest: Nontender, Movement normal; Abdomen: Soft, Nontender, Nondistended, Normal bowel sounds; Genitourinary: No CVA tenderness; Extremities: Peripheral pulses normal, No tenderness, No edema, No calf edema or asymmetry.; Neuro: AA&Ox3, Major CN grossly intact.  Speech clear. No gross focal motor or sensory deficits in extremities.; Skin: Color normal, Warm, Dry.      ED Treatments / Results  Labs (all labs ordered are listed, but only abnormal results are displayed)   EKG None  Radiology   Procedures Procedures (including critical care time)  Medications Ordered in ED Medications - No data to display   Initial Impression / Assessment and Plan / ED Course  I have reviewed the triage vital signs and the nursing notes.  Pertinent labs & imaging results that were available during my care of the patient were reviewed by me and considered in my medical decision making (see chart for details).     MDM Reviewed: previous chart, nursing note and vitals Reviewed previous: labs and CT  scan Interpretation: labs    Results for orders placed or performed during the hospital encounter of 04/06/19  I-stat chem 8, ED (not at [facility] or [facility])  Result Value Ref Range   Sodium 139 135 - 145 mmol/L   Potassium 4.5 3.5 - 5.1 mmol/L   Chloride 102 98 - 111 mmol/L   BUN 11 6 - 20 mg/dL   Creatinine, Ser 2.201.00 0.61 - 1.24 mg/dL   Glucose, Bld 91 70 - 99 mg/dL   Calcium, Ion 2.541.20 1.15 - 1.40 mmol/L   TCO2 27 22 - 32 mmol/L   Hemoglobin 13.3 13.0 - 17.0 g/dL   HCT 27.039.0 62.339.0 - 76.252.0 %    1945:  Pt has tol PO well while in the ED without N/V.  No stooling while in the ED.  Abd remains benign, resps easy, VSS. Pt states he continues to feel better and wants to go home now. Dx and testing, d/w pt.  Questions answered.  Verb understanding, agreeable to d/c home with outpt f/u.        Final Clinical Impressions(s) / ED Diagnoses   Final diagnoses:  None     ED Discharge Orders    None       Samuel JesterMcManus, Tayllor Breitenstein, DO 04/11/19 [id number]

## 2019-04-06 NOTE — ED Triage Notes (Addendum)
BIB EMS with same complaint of abd pain as the last 4 visits in the last week. N/V abd pain 140/90-100-20-97% RA

## 2019-04-18 ENCOUNTER — Inpatient Hospital Stay (INDEPENDENT_AMBULATORY_CARE_PROVIDER_SITE_OTHER): Payer: Self-pay | Admitting: Primary Care

## 2019-04-24 ENCOUNTER — Telehealth: Payer: Self-pay | Admitting: *Deleted

## 2019-04-24 MED FILL — SUCRALFATE 1 GM TABLET: 1 | 30 days supply | Qty: 60 | Fill #0

## 2019-04-24 MED FILL — DICYCLOMINE 20 MG TABLET: 20 | 30 days supply | Qty: 60 | Fill #0

## 2019-04-24 MED FILL — LISINOPRIL-HCTZ 20-12.5 MG: 20-12.5 | 30 days supply | Qty: 30 | Fill #0

## 2019-04-24 MED FILL — PROMETHAZINE HCL 25 MG SUPP: 25 | 15 days supply | Qty: 15 | Fill #0

## 2019-04-24 NOTE — Telephone Encounter (Signed)
TOC CM received call from [place], pt's went to [facility] and refills sent to pharmacy. Requested meds be sent to [facility] pharmacy to be filled. Contacted pt to see if he picked up his meds from [facility]. [place], [place] ED TOC CM [phone]

## 2019-04-25 ENCOUNTER — Telehealth: Payer: Self-pay | Admitting: *Deleted

## 2019-04-25 NOTE — Telephone Encounter (Signed)
ED Visits 9 in past six months/No PCP/No insurance- TOC CM working to assist pt to help him manage his care in the community.   TOC CM attempted call to pt and girlfriend answered phone. States pt missed his appt and she contacted Medicaid and with his Family Planning Medicaid he get one physical per year. They referred her to [facility]. Pt was seen and Rx were sent to [facility]. States she helps him pay for his meds but she cannot afford $100. Explained meds were sent to [facility], copay $24. Provided her with contact info for [facility] to reschedule appt for two months to continue receiving meds. Girlfriend states she will work on getting him insurance in October. [place], [place] ED TOC CM [phone]

## 2019-07-01 ENCOUNTER — Ambulatory Visit: Payer: Self-pay | Attending: Family Medicine | Admitting: Family Medicine

## 2019-07-01 ENCOUNTER — Encounter: Payer: Self-pay | Admitting: Family Medicine

## 2019-07-01 ENCOUNTER — Other Ambulatory Visit: Payer: Self-pay

## 2019-07-01 VITALS — BP 107/54 | HR 96 | Temp 98.2°F | Ht 67.0 in | Wt 172.0 lb

## 2019-07-01 DIAGNOSIS — I10 Essential (primary) hypertension: Secondary | ICD-10-CM

## 2019-07-01 DIAGNOSIS — K0889 Other specified disorders of teeth and supporting structures: Secondary | ICD-10-CM

## 2019-07-01 MED ORDER — AMOXICILLIN 500 MG PO CAPS: 500.0000 mg | ORAL_CAPSULE | Freq: Three times a day (TID) | ORAL | 0 refills | Status: DC

## 2019-07-01 MED FILL — AMOXICILLIN 500 MG CAPSULE: 500 | 10 days supply | Qty: 30 | Fill #0

## 2019-07-01 NOTE — Patient Instructions (Signed)
Dental Pain Dental pain may be caused by many things, including:  Tooth decay (cavities or caries).  Infection.  The inner part of the tooth being filled with pus (abscess).  Injury. Sometimes the cause of pain is unknown. Your pain can vary. It may be mild or severe. You may have it all the time, or it may occur only when you are:  Chewing.  Exposed to hot or cold temperature.  Eating or drinking sugary foods or beverages, such as soda or candy. Follow these instructions at home: Medicines  Take over-the-counter and prescription medicines only as told by your doctor.  If you were prescribed an antibiotic medicine, take it as told by your doctor. Do not stop taking the medicine even if you start to feel better. Eating and drinking  Do not eat foods or drinks that cause you pain. These include: ? Very hot or very cold foods or drinks. ? Sweet or sugary foods or drinks. Managing pain and swelling   Gargle with a salt-water mixture 3-4 times a day. To make this, dissolve -1 tsp of salt in 1 cup of warm water.  If told, put ice on the painful area of your face: ? Put ice in a plastic bag. ? Place a towel between your skin and the bag. ? Leave the ice on for 20 minutes, 2-3 times a day. Brushing your teeth  Brush your teeth twice a day using a fluoride toothpaste.  Floss your teeth once a day.  Use a toothpaste made for sensitive teeth as told by your doctor.  Use a soft toothbrush. General instructions  Do not apply heat to the outside of your face.  Watch your dental pain. Let your doctor know if there are any changes.  Keep all follow-up visits as told by your doctor. This is important. Contact a doctor if:  Your pain is not relieved by medicines.  You have new symptoms.  Your symptoms get worse. Get help right away if:  You cannot open your mouth.  You are having trouble breathing or swallowing.  You have a fever.  Your face, neck, or jaw is  swollen. Summary  Dental pain may be caused by many things, including tooth decay, injury, or infection. In some cases, the cause is not known.  Your pain may be mild or severe. You may have pain all the time, or you may have it only when you eat or drink.  Take over-the-counter and prescription medicines only as told by your doctor.  Watch your dental pain for any changes. Let your doctor know if symptoms get worse. This information is not intended to replace advice given to you by your health care provider. Make sure you discuss any questions you have with your health care provider. Document Released: 01/18/2008 Document Revised: 11/27/2018 Document Reviewed: 08/14/2017 Elsevier Patient Education  2020 Elsevier Inc.  

## 2019-07-01 NOTE — Progress Notes (Signed)
Subjective:  Patient ID: Matthew Scott, male    DOB: 1981-07-14  Age: 38 y.o. MRN: [id number]  CC: Abdominal Pain   [name] is a 38 year old male with a history of hypertension who presents today for follow-up visit.  His chief complaint states abdominal pain but on further questioning he denies presence of abdominal pain as he has none at this time. He is concerned about a tooth ache and cracking of his left upper molar.  Denies swelling, fever but does have difficulty chewing.  He is yet to see the dentist. With regards to his hypertension he has been out of his antihypertensive for several months but his blood pressure is 107/54. His last visit in the clinic was in 07/2016.  Past Medical History:  Diagnosis Date  . 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 Chronic abdominal pain   . Cyclical vomiting syndrome   . GASTRITIS 12/09/2009   Qualifier: Diagnosis of  By: Daphine Deutscher FNP, Zena Amos    . Gastroparesis   . 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 Nausea and vomiting    chronic, recurrent  . Polysubstance abuse [facility])     Past Surgical History:  Procedure Laterality Date  . CHOLECYSTECTOMY  11/2009    Family History  Problem Relation Age of Onset  . Other Father   . Diabetes Father   . Hypertension Father     No Known Allergies  Outpatient Medications Prior to Visit  Medication Sig Dispense Refill  . dicyclomine (BENTYL) 20 MG tablet Take 1 tablet (20 mg total) by mouth 2 (two) times daily. 20 tablet 0  . lisinopril-hydrochlorothiazide (ZESTORETIC) 20-12.5 MG tablet Take 1 tablet by mouth daily.    . ondansetron (ZOFRAN) 4 MG tablet Take 1 tablet (4 mg total) by mouth every 6 (six) hours. 12 tablet 0  . sucralfate (CARAFATE) 1 g tablet Take 1 tablet (1 g total) by mouth 4 (four) times daily -  with meals and at bedtime. 60 tablet 0  . promethazine (PHENERGAN)  25 MG suppository Place 1 suppository (25 mg total) rectally every 6 (six) hours as needed for nausea or vomiting. (Patient not taking: Reported on 07/01/2019) 12 each 0   No facility-administered medications prior to visit.      ROS Review of Systems  Constitutional: Negative for activity change and appetite change.  HENT: Positive for dental problem. Negative for sinus pressure and sore throat.   Eyes: Negative for visual disturbance.  Respiratory: Negative for cough, chest tightness and shortness of breath.   Cardiovascular: Negative for chest pain and leg swelling.  Gastrointestinal: Negative for abdominal distention, abdominal pain, constipation and diarrhea.  Endocrine: Negative.   Genitourinary: Negative for dysuria.  Musculoskeletal: Negative for joint swelling and myalgias.  Skin: Negative for rash.  Allergic/Immunologic: Negative.   Neurological: Negative for weakness, light-headedness and numbness.  Psychiatric/Behavioral: Negative for dysphoric mood and suicidal ideas.    Objective:  BP (!) 107/54   Pulse 96   Temp 98.2 F (36.8 C) (Oral)   Ht 5\' 7"  (1.702 m)   Wt 172 lb (78 kg)   SpO2 96%   BMI 26.94 kg/m   BP/Weight 07/01/2019 04/06/2019 04/05/2019  Systolic BP 107 133 118  Diastolic BP 54 58 80  Wt. (Lbs) 172 180 -  BMI 26.94 28.19 -      Physical Exam Constitutional:  Appearance: He is well-developed.  HENT:     Head:     Comments: Cracked left upper molar Neck:     Vascular: No JVD.  Cardiovascular:     Rate and Rhythm: Normal rate.     Heart sounds: Normal heart sounds. No murmur.  Pulmonary:     Effort: Pulmonary effort is normal.     Breath sounds: Normal breath sounds. No wheezing or rales.  Chest:     Chest wall: No tenderness.  Abdominal:     General: Bowel sounds are normal. There is no distension.     Palpations: Abdomen is soft. There is no mass.     Tenderness: There is no abdominal tenderness.  Musculoskeletal: Normal range  of motion.     Right lower leg: No edema.     Left lower leg: No edema.  Neurological:     Mental Status: He is alert and oriented to person, place, and time.  Psychiatric:        Mood and Affect: Mood normal.     CMP Latest Ref Rng & Units 04/06/2019 04/01/2019 03/28/2019  Glucose 70 - 99 mg/dL 91 191(Y) 782(N)  BUN 6 - 20 mg/dL 11 14 18   Creatinine 0.61 - 1.24 mg/dL 5.62 1.30 8.65  Sodium 135 - 145 mmol/L 139 144 142  Potassium 3.5 - 5.1 mmol/L 4.5 4.1 4.3  Chloride 98 - 111 mmol/L 102 105 106  CO2 22 - 32 mmol/L - 21(L) 20(L)  Calcium 8.9 - 10.3 mg/dL - 9.9 10.5(H)  Total Protein 6.5 - 8.1 g/dL - 8.0 7.8(I)  Total Bilirubin 0.3 - 1.2 mg/dL - 6.9(G) 1.9(H)  Alkaline Phos 38 - 126 U/L - 71 81  AST 15 - 41 U/L - 68(H) 32  ALT 0 - 44 U/L - 34 24    Lipid Panel     Component Value Date/Time   CHOL  11/24/2008 2318    150        ATP III CLASSIFICATION:  <200     mg/dL   Desirable  295-284  mg/dL   Borderline High  >=132    mg/dL   High          TRIG 31 11/24/2008 2318   HDL 52 11/24/2008 2318   CHOLHDL 2.9 11/24/2008 2318   VLDL 6 11/24/2008 2318   LDLCALC  11/24/2008 2318    92        Total Cholesterol/HDL:CHD Risk Coronary Heart Disease Risk Table                     Men   Women  1/2 Average Risk   3.4   3.3  Average Risk       5.0   4.4  2 X Average Risk   9.6   7.1  3 X Average Risk  23.4   11.0        Use the calculated Patient Ratio above and the CHD Risk Table to determine the patient's CHD Risk.        ATP III CLASSIFICATION (LDL):  <100     mg/dL   Optimal  440-102  mg/dL   Near or Above                    Optimal  130-159  mg/dL   Borderline  725-366  mg/dL   High  >440     mg/dL   Very High    CBC  Component Value Date/Time   WBC 11.4 (H) 04/01/2019 1123   RBC 3.86 (L) 04/01/2019 1123   HGB 13.3 04/06/2019 1822   HCT 39.0 04/06/2019 1822   PLT 240 04/01/2019 1123   MCV 101.8 (H) 04/01/2019 1123   MCH 32.9 04/01/2019 1123   MCHC 32.3  04/01/2019 1123   RDW 12.2 04/01/2019 1123   LYMPHSABS 0.7 04/01/2019 1123   MONOABS 0.4 04/01/2019 1123   EOSABS 0.0 04/01/2019 1123   BASOSABS 0.0 04/01/2019 1123    Lab Results  Component Value Date   HGBA1C 4.3 (L) 08/05/2018    Assessment & Plan:   1. Essential hypertension Blood pressure is controlled despite not taking his antihypertensive We will see him at his next visit to follow-up on his blood pressure Consider discontinuing antihypertensive if blood pressure is normal  2. Pain, dental Advised he will need to see a dentist Placed on an antibiotic - amoxicillin (AMOXIL) 500 MG capsule; Take 1 capsule (500 mg total) by mouth 3 (three) times daily.  Dispense: 30 capsule; Refill: 0    Meds ordered this encounter  Medications  . amoxicillin (AMOXIL) 500 MG capsule    Sig: Take 1 capsule (500 mg total) by mouth 3 (three) times daily.    Dispense:  30 capsule    Refill:  0    Follow-up: No follow-ups on file.       Hoy Register, MD, FAAFP. [facility] [place], [place] [phone]   07/01/2019, 2:56 PM

## 2019-07-02 ENCOUNTER — Encounter: Payer: Self-pay | Admitting: Family Medicine

## 2019-07-16 ENCOUNTER — Ambulatory Visit: Payer: Self-pay

## 2019-08-05 MED FILL — AMOXICILLIN 500 MG CAPSULE: 500 | 10 days supply | Qty: 30 | Fill #0

## 2019-08-14 ENCOUNTER — Emergency Department (HOSPITAL_COMMUNITY)
Admission: EM | Admit: 2019-08-14 | Discharge: 2019-08-14 | Disposition: A | Discharge status: Home / Self Care | Payer: Self-pay | Attending: Emergency Medicine | Admitting: Emergency Medicine

## 2019-08-14 ENCOUNTER — Encounter (HOSPITAL_COMMUNITY): Payer: Self-pay | Admitting: Emergency Medicine

## 2019-08-14 DIAGNOSIS — F17290 Nicotine dependence, other tobacco product, uncomplicated: Secondary | ICD-10-CM | POA: Insufficient documentation

## 2019-08-14 DIAGNOSIS — I10 Essential (primary) hypertension: Secondary | ICD-10-CM | POA: Insufficient documentation

## 2019-08-14 DIAGNOSIS — Z79899 Other long term (current) drug therapy: Secondary | ICD-10-CM | POA: Insufficient documentation

## 2019-08-14 DIAGNOSIS — R1010 Upper abdominal pain, unspecified: Secondary | ICD-10-CM | POA: Insufficient documentation

## 2019-08-14 LAB — CBC
HCT: 40.2 % (ref 39.0–52.0)
Hemoglobin: 12.8 g/dL — ABNORMAL LOW (ref 13.0–17.0)
MCH: 33.2 pg (ref 26.0–34.0)
MCHC: 31.8 g/dL (ref 30.0–36.0)
MCV: 104.1 fL — ABNORMAL HIGH (ref 80.0–100.0)
Platelets: 263 10*3/uL (ref 150–400)
RBC: 3.86 MIL/uL — ABNORMAL LOW (ref 4.22–5.81)
RDW: 13 % (ref 11.5–15.5)
WBC: 7.8 10*3/uL (ref 4.0–10.5)
nRBC: 0 % (ref 0.0–0.2)

## 2019-08-14 LAB — URINALYSIS, ROUTINE W REFLEX MICROSCOPIC
Bilirubin Urine: NEGATIVE
Glucose, UA: NEGATIVE mg/dL
Hgb urine dipstick: NEGATIVE
Ketones, ur: NEGATIVE mg/dL
Leukocytes,Ua: NEGATIVE
Nitrite: NEGATIVE
Protein, ur: NEGATIVE mg/dL
Specific Gravity, Urine: 1.028 (ref 1.005–1.030)
pH: 5 (ref 5.0–8.0)

## 2019-08-14 LAB — COMPREHENSIVE METABOLIC PANEL
ALT: 21 U/L (ref 0–44)
AST: 19 U/L (ref 15–41)
Albumin: 4.2 g/dL (ref 3.5–5.0)
Alkaline Phosphatase: 64 U/L (ref 38–126)
Anion gap: 10 (ref 5–15)
BUN: 12 mg/dL (ref 6–20)
CO2: 25 mmol/L (ref 22–32)
Calcium: 9.6 mg/dL (ref 8.9–10.3)
Chloride: 108 mmol/L (ref 98–111)
Creatinine, Ser: 0.83 mg/dL (ref 0.61–1.24)
GFR calc Af Amer: >60 mL/min (ref >60)
GFR calc non Af Amer: >60 mL/min (ref >60)
Glucose, Bld: 95 mg/dL (ref 70–99)
Potassium: 3.7 mmol/L (ref 3.5–5.1)
Sodium: 143 mmol/L (ref 135–145)
Total Bilirubin: 1.5 mg/dL — ABNORMAL HIGH (ref 0.3–1.2)
Total Protein: 7.2 g/dL (ref 6.5–8.1)

## 2019-08-14 LAB — LIPASE, BLOOD: Lipase: 20 U/L (ref 11–51)

## 2019-08-14 MED ORDER — SUCRALFATE 1 G PO TABS
1.0000 g | ORAL_TABLET | Freq: Once | ORAL | Status: AC
  Administered 2019-08-14: 1 g via ORAL
  Filled 2019-08-14: qty 1

## 2019-08-14 MED ORDER — SUCRALFATE 1 G PO TABS: 1.0000 g | ORAL_TABLET | Freq: Three times a day (TID) | ORAL | 0 refills | Status: DC

## 2019-08-14 MED ORDER — DICYCLOMINE HCL 10 MG PO CAPS
20.0000 mg | ORAL_CAPSULE | Freq: Once | ORAL | Status: AC
  Administered 2019-08-14: 20 mg via ORAL
  Filled 2019-08-14: qty 2

## 2019-08-14 MED ORDER — DICYCLOMINE HCL 20 MG PO TABS: 20.0000 mg | ORAL_TABLET | Freq: Two times a day (BID) | ORAL | 0 refills | Status: DC

## 2019-08-14 NOTE — Discharge Instructions (Addendum)
You have been diagnosed today with upper abdominal pain.  At this time there does not appear to be the presence of an emergent medical condition, however there is always the potential for conditions to change. Please read and follow the below instructions.  Please return to the Emergency Department immediately for any new or worsening symptoms. Please be sure to follow up with your Primary Care Provider within one week regarding your visit today; please call their office to schedule an appointment even if you are feeling better for a follow-up visit. Your medications have been refilled today, continue to take as prescribed.  Please drink plenty of water and get plenty of rest.  As we discussed it is important to stop using marijuana as this may be contributing to your chronic symptoms. Please call the specialist at [facility] gastroenterology to schedule a follow-up appointment for your chronic abdominal pain.  Get help right away if: Your pain does not go away as soon as your doctor says it should. You cannot stop throwing up. Your pain is only in areas of your belly, such as the right side or the left lower part of the belly. You have bloody or black poop, or poop that looks like tar. You have very bad pain, cramping, or bloating in your belly. You have signs of not having enough fluid or water in your body (dehydration), such as: Dark pee, very little pee, or no pee. Cracked lips. Dry mouth. Sunken eyes. Sleepiness. Weakness. You have any new/concerning or worsening of symptoms  Please read the additional information packets attached to your discharge summary.  Do not take your medicine if  develop an itchy rash, swelling in your mouth or lips, or difficulty breathing; call 911 and seek immediate emergency medical attention if this occurs.  Note: Portions of this text may have been transcribed using voice recognition software. Every effort was made to ensure accuracy; however, inadvertent  computerized transcription errors may still be present.

## 2019-08-14 NOTE — ED Provider Notes (Signed)
[facility]-EMERGENCY DEPT Provider Note   CSN: [id number] Arrival date & time: 08/14/19  1131     History Chief Complaint  Patient presents with  . Abdominal Pain    Matthew Scott is a 38 y.o. male history biliary dyskinesia, cholecystectomy, chronic abdominal pain, cyclical vomiting syndrome, gastritis, gastroparesis, H. pylori, polysubstance abuse.  Patient presents today requesting medication refill of Bentyl and Carafate.  He reports that he ran out of these medications 3 days ago and has not been able to contact his primary care provider for refills.  He reports that over the past 3 days he has had increasing upper abdominal pain a mild burning sensation 2/10 in severity constant without clear aggravating factors, he reports that he has chronic abdominal pain but has been managing this well with the above medications.  He denies any radiation of his pain.  He reports his pain worsened this morning and he had 1 episode of small amount nonbloody/nonbilious emesis which is why he contacted EMS.  Additionally patient does endorse using marijuana still, he states he is aware of his need to stop using marijuana as he has a history of cannabis hyperemesis syndrome.  Patient denies any fall/injury, fever/chills, headache, neck stiffness, chest pain/shortness of breath, hematemesis, dysuria/hematuria, bloating, blood in the stool, melena or any additional concerns.  HPI     Past Medical History:  Diagnosis Date  . 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 Chronic abdominal pain   . Cyclical vomiting syndrome   . GASTRITIS 12/09/2009   Qualifier: Diagnosis of  By: Daphine Deutscher FNP, Zena Amos    . Gastroparesis   . 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 Nausea and vomiting    chronic, recurrent  . Polysubstance abuse [facility])     Patient Active Problem List   Diagnosis  Date Noted  . 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 Intractable nausea and vomiting 08/04/2018  . Elevated AST (SGOT) 08/04/2018  . Uncontrollable vomiting 03/10/2016  . Anemia 03/10/2016  . Metatarsal fracture 12/22/2015  . Essential hypertension 12/22/2015  . Night sweats 01/21/2015  . GERD (gastroesophageal reflux disease) 01/21/2015  . Gastritis 12/19/2014  . Hyperbilirubinemia 12/19/2014  . Elevated bilirubin   . Nausea & vomiting 12/14/2014  . Severe tetrahydrocannabinol (THC) dependence (HCC) 12/14/2014  . Alcohol abuse 12/14/2014  . Cyclic vomiting syndrome 12/06/2014  . 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 Gastroparesis 05/11/2012  . Abdominal pain 05/06/2012  . Polysubstance abuse (HCC) 05/06/2012  . CHOLECYSTECTOMY, HX OF 11/25/2009    Past Surgical History:  Procedure Laterality Date  . CHOLECYSTECTOMY  11/2009       Family History  Problem Relation Age of Onset  . Other Father   . Diabetes Father   . Hypertension Father     Social History   Tobacco Use  . Smoking status: Current Some Day Smoker    Years: 8.00    Types: Cigars    Last attempt to quit: 12/15/2014    Years since quitting: 4.6  . Smokeless tobacco: Never Used  Substance Use Topics  . Alcohol use: No    Alcohol/week: 0.0 standard drinks  . Drug use: Yes    Types: Marijuana    Comment: Last used: 4 days ago per patient on 04/30/2018  Home Medications Prior to Admission medications   Medication Sig Start Date End Date Taking? Authorizing Provider  amoxicillin (AMOXIL) 500 MG capsule Take 1 capsule (500 mg total) by mouth 3 (three) times daily. 07/01/19   Hoy RegisterNewlin, Enobong, MD  dicyclomine (BENTYL) 20 MG tablet Take 1 tablet (20 mg total) by mouth 2 (two) times daily for 10 days. 08/14/19 08/24/19  Harlene SaltsMorelli, Shawan Corella A, PA-C  lisinopril-hydrochlorothiazide (ZESTORETIC)  20-12.5 MG tablet Take 1 tablet by mouth daily.    [provider]  ondansetron (ZOFRAN) 4 MG tablet Take 1 tablet (4 mg total) by mouth every 6 (six) hours. 04/05/19   Law, Waylan BogaAlexandra M, PA-C  promethazine (PHENERGAN) 25 MG suppository Place 1 suppository (25 mg total) rectally every 6 (six) hours as needed for nausea or vomiting. Patient not taking: Reported on 07/01/2019 04/05/19   Emi HolesLaw, Alexandra M, PA-C  sucralfate (CARAFATE) 1 g tablet Take 1 tablet (1 g total) by mouth 4 (four) times daily -  with meals and at bedtime for 14 days. 08/14/19 08/28/19  Bill SalinasMorelli, Ashtin Rosner A, PA-C    Allergies    Patient has no known allergies.  Review of Systems   Review of Systems Ten systems are reviewed and are negative for acute change except as noted in the HPI  Physical Exam Updated Vital Signs BP 134/79 (BP Location: Right Arm)   Pulse 60   Temp 98.7 F (37.1 C) (Oral)   Resp 17   Ht 5\' 8"  (1.727 m)   Wt 81.6 kg   SpO2 99%   BMI 27.37 kg/m   Physical Exam Constitutional:      General: He is not in acute distress.    Appearance: Normal appearance. He is well-developed. He is not ill-appearing or diaphoretic.  HENT:     Head: Normocephalic and atraumatic.     Right Ear: External ear normal.     Left Ear: External ear normal.     Nose: Nose normal.  Eyes:     General: Vision grossly intact. Gaze aligned appropriately.     Pupils: Pupils are equal, round, and reactive to light.  Neck:     Trachea: Trachea and phonation normal. No tracheal deviation.  Pulmonary:     Effort: Pulmonary effort is normal. No respiratory distress.  Abdominal:     General: There is no distension.     Palpations: Abdomen is soft.     Tenderness: There is no abdominal tenderness. There is no guarding or rebound. Negative signs include Murphy's sign and McBurney's sign.  Musculoskeletal:        General: Normal range of motion.     Cervical back: Normal range of motion.  Skin:    General: Skin is warm  and dry.  Neurological:     Mental Status: He is alert.     GCS: GCS eye subscore is 4. GCS verbal subscore is 5. GCS motor subscore is 6.     Comments: Speech is clear and goal oriented, follows commands Major Cranial nerves without deficit, no facial droop Moves extremities without ataxia, coordination intact  Psychiatric:        Behavior: Behavior normal.     ED Results / Procedures / Treatments   Labs (all labs ordered are listed, but only abnormal results are displayed) Labs Reviewed  COMPREHENSIVE METABOLIC PANEL - Abnormal; Notable for the following components:      Result Value   Total Bilirubin 1.5 (*)    All other components within normal limits  CBC - Abnormal; Notable for the following components:   RBC 3.86 (*)    Hemoglobin 12.8 (*)    MCV 104.1 (*)    All other components within normal limits  LIPASE, BLOOD  URINALYSIS, ROUTINE W REFLEX MICROSCOPIC    EKG None  Radiology No results found.  Procedures Procedures (including critical care time)  Medications Ordered in ED Medications  dicyclomine (BENTYL) capsule 20 mg (20 mg Oral Given 08/14/19 1235)  sucralfate (CARAFATE) tablet 1 g (1 g Oral Given 08/14/19 1235)    ED Course  I have reviewed the triage vital signs and the nursing notes.  Pertinent labs & imaging results that were available during my care of the patient were reviewed by me and considered in my medical decision making (see chart for details).    MDM Rules/Calculators/A&P                     38 year old male presents today requesting medication refill of Bentyl and Carafate.  He is well-appearing and in no acute distress.  Cranial nerves intact, no meningeal signs, no increased work of breathing, abdomen is soft nontender and without peritoneal signs, NVI extremities x4.  He is reporting 3 days of mild abdominal pain after running out of above medications with one episode of small amount nonbloody/nonbilious emesis this morning.  Will  obtain abdominal pain labs and ordered patient's home medications and reassess. - CBC with hemoglobin 12.8, consistent with prior, no leukocytosis Lipase within normal limits CMP with bilirubin 1.5 otherwise within normal limits, consistent with prior Urinalysis within normal limits - 12:45 PM: Patient reassessed he is resting comfortably and in no acute distress.  He is eating a full leftover breakfast tray given to him by nursing staff.  He reports that he is feeling well and would like to be discharged.  He has no evidence of pain and is almost finished his breakfast.   - Plan of care is to refill Bentyl and Carafate then discharge with GI referral and PCP follow-up.  There is no indication for imaging or further work-up at this time, doubt PUD, perforation, appendicitis, SBO or other emergent etiology of patient's symptoms today.  At this time there does not appear to be any evidence of an acute emergency medical condition and the patient appears stable for discharge with appropriate outpatient follow up. Diagnosis was discussed with patient who verbalizes understanding of care plan and is agreeable to discharge. I have discussed return precautions with patient who verbalizes understanding of return precautions. Patient encouraged to follow-up with their PCP and GI. All questions answered. Patient has been discharged in good condition.   Note: Portions of this report may have been transcribed using voice recognition software. Every effort was made to ensure accuracy; however, inadvertent computerized transcription errors may still be present. Final Clinical Impression(s) / ED Diagnoses Final diagnoses:  Pain of upper abdomen    Rx / DC Orders ED Discharge Orders         Ordered    dicyclomine (BENTYL) 20 MG tablet  2 times daily     08/14/19 1258    sucralfate (CARAFATE) 1 g tablet  3 times daily with meals & bedtime     08/14/19 1258           Gari Crown 08/14/19  1258    Charlesetta Shanks, MD 08/18/19 1550

## 2019-08-14 NOTE — ED Triage Notes (Signed)
Per EMS, patient from home, c/o chronic RUQ abdominal pain. States ran out of pain medications. Denies N/V/D.

## 2019-08-19 MED FILL — levoFLOXacin 750 MG TABS: 750 | 7 days supply | Qty: 7 | Fill #0

## 2019-08-19 MED FILL — METRONIDAZOLE 500 MG TABS: 500 | 7 days supply | Qty: 21 | Fill #0

## 2019-08-21 ENCOUNTER — Emergency Department (HOSPITAL_COMMUNITY)
Admission: EM | Admit: 2019-08-21 | Discharge: 2019-08-21 | Disposition: A | Discharge status: Home / Self Care | Payer: Self-pay | Attending: Emergency Medicine | Admitting: Emergency Medicine

## 2019-08-21 ENCOUNTER — Encounter (HOSPITAL_COMMUNITY): Payer: Self-pay

## 2019-08-21 ENCOUNTER — Other Ambulatory Visit: Payer: Self-pay

## 2019-08-21 DIAGNOSIS — R109 Unspecified abdominal pain: Secondary | ICD-10-CM | POA: Insufficient documentation

## 2019-08-21 DIAGNOSIS — R112 Nausea with vomiting, unspecified: Secondary | ICD-10-CM | POA: Insufficient documentation

## 2019-08-21 DIAGNOSIS — Z5321 Procedure and treatment not carried out due to patient leaving prior to being seen by health care provider: Secondary | ICD-10-CM | POA: Insufficient documentation

## 2019-08-21 LAB — COMPREHENSIVE METABOLIC PANEL
ALT: 49 U/L — ABNORMAL HIGH (ref 0–44)
AST: 61 U/L — ABNORMAL HIGH (ref 15–41)
Albumin: 4.3 g/dL (ref 3.5–5.0)
Alkaline Phosphatase: 56 U/L (ref 38–126)
Anion gap: 12 (ref 5–15)
BUN: 7 mg/dL (ref 6–20)
CO2: 26 mmol/L (ref 22–32)
Calcium: 9.7 mg/dL (ref 8.9–10.3)
Chloride: 106 mmol/L (ref 98–111)
Creatinine, Ser: 0.87 mg/dL (ref 0.61–1.24)
GFR calc Af Amer: >60 mL/min (ref >60)
GFR calc non Af Amer: >60 mL/min (ref >60)
Glucose, Bld: 103 mg/dL — ABNORMAL HIGH (ref 70–99)
Potassium: 3.9 mmol/L (ref 3.5–5.1)
Sodium: 144 mmol/L (ref 135–145)
Total Bilirubin: 1.5 mg/dL — ABNORMAL HIGH (ref 0.3–1.2)
Total Protein: 7.1 g/dL (ref 6.5–8.1)

## 2019-08-21 LAB — CBC
HCT: 36.2 % — ABNORMAL LOW (ref 39.0–52.0)
Hemoglobin: 11.7 g/dL — ABNORMAL LOW (ref 13.0–17.0)
MCH: 32.7 pg (ref 26.0–34.0)
MCHC: 32.3 g/dL (ref 30.0–36.0)
MCV: 101.1 fL — ABNORMAL HIGH (ref 80.0–100.0)
Platelets: 246 10*3/uL (ref 150–400)
RBC: 3.58 MIL/uL — ABNORMAL LOW (ref 4.22–5.81)
RDW: 11.9 % (ref 11.5–15.5)
WBC: 11.3 10*3/uL — ABNORMAL HIGH (ref 4.0–10.5)
nRBC: 0 % (ref 0.0–0.2)

## 2019-08-21 LAB — LIPASE, BLOOD: Lipase: 18 U/L (ref 11–51)

## 2019-08-21 MED ORDER — SODIUM CHLORIDE 0.9% FLUSH: 3.0000 mL | Freq: Once | INTRAVENOUS | Status: DC

## 2019-08-21 MED ORDER — ONDANSETRON 4 MG PO TBDP
4.0000 mg | ORAL_TABLET | Freq: Once | ORAL | Status: DC | PRN
  Filled 2019-08-21: qty 1

## 2019-08-21 NOTE — ED Notes (Signed)
Pt seen leaving the building

## 2019-08-21 NOTE — ED Triage Notes (Signed)
Per EMS- Patient c/o abdominal pain and vomiting. Patient states he went to a [facility] hospital for the same 4 days ago.

## 2019-09-18 ENCOUNTER — Other Ambulatory Visit: Payer: Self-pay | Admitting: Family Medicine

## 2019-11-15 ENCOUNTER — Other Ambulatory Visit: Payer: Self-pay

## 2019-11-15 ENCOUNTER — Emergency Department (HOSPITAL_COMMUNITY)
Admission: EM | Admit: 2019-11-15 | Discharge: 2019-11-15 | Disposition: A | Discharge status: Home / Self Care | Payer: Self-pay | Attending: Emergency Medicine | Admitting: Emergency Medicine

## 2019-11-15 ENCOUNTER — Encounter (HOSPITAL_COMMUNITY): Payer: Self-pay

## 2019-11-15 DIAGNOSIS — R1115 Cyclical vomiting syndrome unrelated to migraine: Secondary | ICD-10-CM | POA: Insufficient documentation

## 2019-11-15 DIAGNOSIS — F17210 Nicotine dependence, cigarettes, uncomplicated: Secondary | ICD-10-CM | POA: Insufficient documentation

## 2019-11-15 DIAGNOSIS — I10 Essential (primary) hypertension: Secondary | ICD-10-CM | POA: Insufficient documentation

## 2019-11-15 LAB — CBC WITH DIFFERENTIAL/PLATELET
Abs Immature Granulocytes: 0.02 10*3/uL (ref 0.00–0.07)
Basophils Absolute: 0 10*3/uL (ref 0.0–0.1)
Basophils Relative: 0 %
Eosinophils Absolute: 0 10*3/uL (ref 0.0–0.5)
Eosinophils Relative: 0 %
HCT: 38 % — ABNORMAL LOW (ref 39.0–52.0)
Hemoglobin: 12.2 g/dL — ABNORMAL LOW (ref 13.0–17.0)
Immature Granulocytes: 0 %
Lymphocytes Relative: 14 %
Lymphs Abs: 1.4 10*3/uL (ref 0.7–4.0)
MCH: 32.3 pg (ref 26.0–34.0)
MCHC: 32.1 g/dL (ref 30.0–36.0)
MCV: 100.5 fL — ABNORMAL HIGH (ref 80.0–100.0)
Monocytes Absolute: 0.5 10*3/uL (ref 0.1–1.0)
Monocytes Relative: 5 %
Neutro Abs: 7.6 10*3/uL (ref 1.7–7.7)
Neutrophils Relative %: 81 %
Platelets: 229 10*3/uL (ref 150–400)
RBC: 3.78 MIL/uL — ABNORMAL LOW (ref 4.22–5.81)
RDW: 12 % (ref 11.5–15.5)
WBC: 9.5 10*3/uL (ref 4.0–10.5)
nRBC: 0 % (ref 0.0–0.2)

## 2019-11-15 LAB — COMPREHENSIVE METABOLIC PANEL
ALT: 27 U/L (ref 0–44)
AST: 51 U/L — ABNORMAL HIGH (ref 15–41)
Albumin: 4.4 g/dL (ref 3.5–5.0)
Alkaline Phosphatase: 59 U/L (ref 38–126)
Anion gap: 10 (ref 5–15)
BUN: 12 mg/dL (ref 6–20)
CO2: 25 mmol/L (ref 22–32)
Calcium: 9.4 mg/dL (ref 8.9–10.3)
Chloride: 108 mmol/L (ref 98–111)
Creatinine, Ser: 0.91 mg/dL (ref 0.61–1.24)
GFR calc Af Amer: >60 mL/min (ref >60)
GFR calc non Af Amer: >60 mL/min (ref >60)
Glucose, Bld: 112 mg/dL — ABNORMAL HIGH (ref 70–99)
Potassium: 4 mmol/L (ref 3.5–5.1)
Sodium: 143 mmol/L (ref 135–145)
Total Bilirubin: 0.7 mg/dL (ref 0.3–1.2)
Total Protein: 7.1 g/dL (ref 6.5–8.1)

## 2019-11-15 LAB — LIPASE, BLOOD: Lipase: 18 U/L (ref 11–51)

## 2019-11-15 MED ORDER — SODIUM CHLORIDE 0.9 % IV BOLUS
1000.0000 mL | Freq: Once | INTRAVENOUS | Status: AC
  Administered 2019-11-15: 1000 mL via INTRAVENOUS

## 2019-11-15 NOTE — ED Triage Notes (Signed)
Pt BIB EMS from patients work. Pt c/o severe abdominal pain and cramping with vomiting. Pt states this is an ongoing problem, started this morning again. Pt smokes marijuana.   20G L AC  EMS gave 500 mL NS 4 mg zofran 150 mcg of fentanyl

## 2019-11-15 NOTE — ED Provider Notes (Signed)
[facility] DEPT Provider Note   CSN: [id number] Arrival date & time: 11/15/19  1400     History Chief Complaint  Patient presents with  . Abdominal Pain  . Emesis    Matthew Scott is a 39 y.o. male.  HPI 39 year old male presents with recurrent abdominal pain and vomiting.  He states he daily has abdominal pain that waxes and wanes.  However today it seemed to get worse in the morning at work.  All of a sudden had 3 episodes of nonbloody emesis.  No fevers.  Symptoms are all significantly better and essentially gone after EMS gave IV fentanyl and Zofran and a 500 cc IV fluids.  He states that this has occurred many times before.  He has been told before that is from marijuana, which he uses daily. Patient is asking for something to drink.   Past Medical History:  Diagnosis Date  . 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 Chronic abdominal pain   . Cyclical vomiting syndrome   . GASTRITIS 12/09/2009   Qualifier: Diagnosis of  By: Hassell Done FNP, Tori Milks    . Gastroparesis   . HELICOBACTER PYLORI INFECTION 12/14/2009   Qualifier: Diagnosis of  By: Isla Pence    . Nausea and vomiting    chronic, recurrent  . Polysubstance abuse [facility])     Patient Active Problem List   Diagnosis Date Noted  . 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 Cannabis hyperemesis syndrome concurrent with and due to cannabis abuse ([place]) 08/06/2018  . Intractable nausea and vomiting 08/04/2018  . Elevated AST (SGOT) 08/04/2018  . Uncontrollable vomiting 03/10/2016  . Anemia 03/10/2016  . Metatarsal fracture 12/22/2015  . Essential hypertension 12/22/2015  . Night sweats 01/21/2015  . GERD (gastroesophageal reflux disease) 01/21/2015  . Gastritis 12/19/2014  . Hyperbilirubinemia 12/19/2014  . Elevated bilirubin   . Nausea & vomiting 12/14/2014  . Severe tetrahydrocannabinol (THC) dependence ([place]) 12/14/2014  . Alcohol abuse  12/14/2014  . Cyclic vomiting syndrome 12/06/2014  . 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 Gastroparesis 05/11/2012  . Abdominal pain 05/06/2012  . Polysubstance abuse ([place]) 05/06/2012  . CHOLECYSTECTOMY, HX OF 11/25/2009    Past Surgical History:  Procedure Laterality Date  . CHOLECYSTECTOMY  11/2009       Family History  Problem Relation Age of Onset  . Other Father   . Diabetes Father   . Hypertension Father     Social History   Tobacco Use  . Smoking status: Current Some Day Smoker    Years: 8.00    Types: Cigars    Last attempt to quit: 12/15/2014    Years since quitting: 4.9  . Smokeless tobacco: Never Used  Substance Use Topics  . Alcohol use: No    Alcohol/week: 0.0 standard drinks  . Drug use: Yes    Types: Marijuana    Comment: Last used: 4 days ago per patient on 04/30/2018    Home Medications Prior to Admission medications   Medication Sig Start Date End Date Taking? Authorizing Provider  dicyclomine (BENTYL) 20 MG tablet TAKE 1 TABLET BY MOUTH 2 TIMES DAILY FOR 10 DAYS Patient taking differently: Take 20 mg by mouth daily.  09/20/19  Yes Newlin, Charlane Ferretti, MD  ondansetron (ZOFRAN) 4 MG tablet Take 1 tablet (4 mg total) by mouth every 6 (six) hours. 04/05/19  Yes Law, Bea Graff, PA-C  promethazine (PHENERGAN) 25 MG suppository  Place 1 suppository (25 mg total) rectally every 6 (six) hours as needed for nausea or vomiting. 04/05/19  Yes Law, Alexandra M, PA-C  sucralfate (CARAFATE) 1 g tablet TAKE 1 TABLET BY MOUTH 4 TIMES DAILY WITH MEALS AND AT BEDTIME FOR 14 DAYS Patient taking differently: Take 1 g by mouth in the morning, at noon, and at bedtime.  09/20/19  Yes Hoy Register, MD  amoxicillin (AMOXIL) 500 MG capsule Take 1 capsule (500 mg total) by mouth 3 (three) times daily. Patient not taking: Reported on 11/15/2019 07/01/19   Hoy Register, MD    Allergies    Patient has no known allergies.  Review of Systems     Review of Systems  Constitutional: Negative for fever.  Gastrointestinal: Positive for abdominal pain and vomiting. Negative for diarrhea.  All other systems reviewed and are negative.   Physical Exam Updated Vital Signs BP (!) 119/59   Pulse 64   Temp 97.9 F (36.6 C) (Oral)   Resp 15   SpO2 100%   Physical Exam Vitals and nursing note reviewed.  Constitutional:      General: He is not in acute distress.    Appearance: He is well-developed. He is not ill-appearing or diaphoretic.  HENT:     Head: Normocephalic and atraumatic.     Right Ear: External ear normal.     Left Ear: External ear normal.     Nose: Nose normal.  Eyes:     General:        Right eye: No discharge.        Left eye: No discharge.  Cardiovascular:     Rate and Rhythm: Normal rate and regular rhythm.     Heart sounds: Normal heart sounds.  Pulmonary:     Effort: Pulmonary effort is normal.     Breath sounds: Normal breath sounds.  Abdominal:     General: There is no distension.     Palpations: Abdomen is soft.     Tenderness: There is no abdominal tenderness.  Musculoskeletal:     Cervical back: Neck supple.  Skin:    General: Skin is warm and dry.  Neurological:     Mental Status: He is alert.  Psychiatric:        Mood and Affect: Mood is not anxious.     ED Results / Procedures / Treatments   Labs (all labs ordered are listed, but only abnormal results are displayed) Labs Reviewed  COMPREHENSIVE METABOLIC PANEL - Abnormal; Notable for the following components:      Result Value   Glucose, Bld 112 (*)    AST 51 (*)    All other components within normal limits  CBC WITH DIFFERENTIAL/PLATELET - Abnormal; Notable for the following components:   RBC 3.78 (*)    Hemoglobin 12.2 (*)    HCT 38.0 (*)    MCV 100.5 (*)    All other components within normal limits  LIPASE, BLOOD    EKG None  Radiology No results found.  Procedures Procedures (including critical care  time)  Medications Ordered in ED Medications  sodium chloride 0.9 % bolus 1,000 mL (1,000 mLs Intravenous New Bag/Given 11/15/19 1422)    ED Course  I have reviewed the triage vital signs and the nursing notes.  Pertinent labs & imaging results that were available during my care of the patient were reviewed by me and considered in my medical decision making (see chart for details).    MDM Rules/Calculators/A&P  Patient feels fine now. No vomiting, and is tolerating PO. Abdominal exam is benign. This is a recurrent issue, likely from marijuana. We discussed need to stop. Otherwise, appears stable for discharge with return precautions. Final Clinical Impression(s) / ED Diagnoses Final diagnoses:  Cyclic vomiting syndrome    Rx / DC Orders ED Discharge Orders    None       Pricilla Loveless, MD 11/15/19 1507

## 2019-11-15 NOTE — Discharge Instructions (Signed)
If you develop worsening, continued, or recurrent abdominal pain, uncontrolled vomiting, fever, chest or back pain, or any other new/concerning symptoms then return to the ER for evaluation.  

## 2019-11-19 ENCOUNTER — Other Ambulatory Visit: Payer: Self-pay

## 2019-11-19 ENCOUNTER — Emergency Department (HOSPITAL_COMMUNITY)
Admission: EM | Admit: 2019-11-19 | Discharge: 2019-11-19 | Disposition: A | Discharge status: Home / Self Care | Payer: Self-pay | Attending: Emergency Medicine | Admitting: Emergency Medicine

## 2019-11-19 ENCOUNTER — Encounter (HOSPITAL_COMMUNITY): Payer: Self-pay

## 2019-11-19 DIAGNOSIS — R1031 Right lower quadrant pain: Secondary | ICD-10-CM | POA: Insufficient documentation

## 2019-11-19 DIAGNOSIS — Z5321 Procedure and treatment not carried out due to patient leaving prior to being seen by health care provider: Secondary | ICD-10-CM | POA: Insufficient documentation

## 2019-11-19 NOTE — ED Triage Notes (Signed)
Arrived by EMS with c/o RLQ abdominal pain and diarrhea x4 days. Patient seen here in this ED on Sunday 11/15/19 for same. Patient says he has not had any improvement. NAD

## 2019-11-19 NOTE — ED Notes (Signed)
1st attempt PT did not answer

## 2019-11-19 NOTE — ED Notes (Signed)
Patient called x2 for room placement with no answer. 

## 2019-12-26 ENCOUNTER — Ambulatory Visit: Payer: Self-pay | Attending: Family Medicine

## 2019-12-26 ENCOUNTER — Other Ambulatory Visit: Payer: Self-pay

## 2020-01-01 ENCOUNTER — Other Ambulatory Visit: Payer: Self-pay

## 2020-01-01 ENCOUNTER — Ambulatory Visit: Payer: Self-pay | Attending: Family Medicine | Admitting: Physician Assistant

## 2020-01-01 DIAGNOSIS — K089 Disorder of teeth and supporting structures, unspecified: Secondary | ICD-10-CM

## 2020-01-01 DIAGNOSIS — R1115 Cyclical vomiting syndrome unrelated to migraine: Secondary | ICD-10-CM

## 2020-01-01 MED ORDER — CAPSAICIN 0.025 % EX CREA: TOPICAL_CREAM | Freq: Two times a day (BID) | CUTANEOUS | 0 refills | Status: DC

## 2020-01-01 MED ORDER — DICYCLOMINE HCL 20 MG PO TABS: 20.0000 mg | ORAL_TABLET | Freq: Every day | ORAL | 0 refills | Status: DC

## 2020-01-01 MED ORDER — ONDANSETRON HCL 4 MG PO TABS: 4.0000 mg | ORAL_TABLET | Freq: Four times a day (QID) | ORAL | 0 refills | Status: DC | PRN

## 2020-01-01 MED ORDER — SUCRALFATE 1 G PO TABS: 1.0000 g | ORAL_TABLET | Freq: Three times a day (TID) | ORAL | 0 refills | Status: DC

## 2020-01-01 MED FILL — ONDANSETRON HCL 4 MG TABLET: 4 | 30 days supply | Qty: 20 | Fill #0

## 2020-01-01 MED FILL — SUCRALFATE 1 GM TABLET: 1 | 20 days supply | Qty: 60 | Fill #0

## 2020-01-01 MED FILL — DICYCLOMINE 20 MG TABLET: 20 | 30 days supply | Qty: 30 | Fill #0

## 2020-01-01 NOTE — Progress Notes (Signed)
Virtual Visit via Telephone Note  I connected with Matthew Scott on 01/01/20 at  2:10 PM EDT by telephone and verified that I am speaking with the correct person using two identifiers.   I discussed the limitations, risks, security and privacy concerns of performing an evaluation and management service by telephone and the availability of in person appointments. I also discussed with the patient that there may be a patient responsible charge related to this service. The patient expressed understanding and agreed to proceed.    PATIENT visit by telephone virtually in the context of Covid-19 pandemic. Patient location:  driving My Location:  [facility] office Persons on the call:  Me and the patient.     History of Present Illness: Patient has been seen in ED for cyclical vomiting syndrome beleieved to be related to marijuana use.  He is still using marijuana.  Not currently nauseated or vomiting but wants RF on meds and referral to GI.  No melena or hematochezia.  No fever.  No urinary s/sx.  He is also requesting a cream that he got a while ago.  I see a previous rx for capsacin.      Observations/Objective:  NAD.  A&Ox3   Assessment and Plan: 1. Cyclic vomiting syndrome I have counseled the patient at length about substance abuse and addiction.  12 step meetings/recovery recommended.  Local 12 step meeting lists were given and attendance was encouraged.  Patient expresses understanding.  - sucralfate (CARAFATE) 1 g tablet; Take 1 tablet (1 g total) by mouth in the morning, at noon, and at bedtime.  Dispense: 60 tablet; Refill: 0 - dicyclomine (BENTYL) 20 MG tablet; Take 1 tablet (20 mg total) by mouth daily.  Dispense: 30 tablet; Refill: 0 - ondansetron (ZOFRAN) 4 MG tablet; Take 1 tablet (4 mg total) by mouth every 6 (six) hours as needed for nausea.  Dispense: 30 tablet; Refill: 0 - Ambulatory referral to Gastroenterology  2. Poor dentition - Ambulatory referral to  Dentistry    Follow Up Instructions: See PCP prn   I discussed the assessment and treatment plan with the patient. The patient was provided an opportunity to ask questions and all were answered. The patient agreed with the plan and demonstrated an understanding of the instructions.   The patient was advised to call back or seek an in-person evaluation if the symptoms worsen or if the condition fails to improve as anticipated.  I provided 9 minutes of non-face-to-face time during this encounter.   Georgian Co, PA-C  Patient ID: Matthew Scott, male   DOB: Jan 05, 1981, 39 y.o.   MRN: [id number]

## 2020-01-02 ENCOUNTER — Encounter: Payer: Self-pay | Admitting: Nurse Practitioner

## 2020-01-08 ENCOUNTER — Emergency Department (HOSPITAL_COMMUNITY)
Admission: EM | Admit: 2020-01-08 | Discharge: 2020-01-08 | Disposition: A | Discharge status: Home / Self Care | Payer: 59 | Attending: Emergency Medicine | Admitting: Emergency Medicine

## 2020-01-08 ENCOUNTER — Other Ambulatory Visit: Payer: Self-pay

## 2020-01-08 ENCOUNTER — Encounter (HOSPITAL_COMMUNITY): Payer: Self-pay

## 2020-01-08 DIAGNOSIS — R1011 Right upper quadrant pain: Secondary | ICD-10-CM | POA: Diagnosis not present

## 2020-01-08 DIAGNOSIS — R1110 Vomiting, unspecified: Secondary | ICD-10-CM | POA: Diagnosis not present

## 2020-01-08 DIAGNOSIS — Z5321 Procedure and treatment not carried out due to patient leaving prior to being seen by health care provider: Secondary | ICD-10-CM | POA: Insufficient documentation

## 2020-01-08 MED ORDER — SODIUM CHLORIDE 0.9% FLUSH: 3.0000 mL | Freq: Once | INTRAVENOUS | Status: DC

## 2020-01-08 NOTE — ED Notes (Signed)
Called for labs x1 No answer 

## 2020-01-08 NOTE — ED Triage Notes (Signed)
Patient c/o RUQ pain tht started today and emesis x 1 only.

## 2020-01-14 ENCOUNTER — Other Ambulatory Visit: Payer: Self-pay

## 2020-01-14 DIAGNOSIS — R1115 Cyclical vomiting syndrome unrelated to migraine: Secondary | ICD-10-CM

## 2020-01-14 MED ORDER — SUCRALFATE 1 G PO TABS: 1.0000 g | ORAL_TABLET | Freq: Three times a day (TID) | ORAL | 0 refills | Status: DC

## 2020-01-14 MED ORDER — DICYCLOMINE HCL 20 MG PO TABS: 20.0000 mg | ORAL_TABLET | Freq: Every day | ORAL | 0 refills | Status: DC

## 2020-01-14 MED ORDER — CAPSAICIN 0.025 % EX CREA: TOPICAL_CREAM | Freq: Two times a day (BID) | CUTANEOUS | 0 refills | Status: DC

## 2020-01-14 MED ORDER — ONDANSETRON HCL 4 MG PO TABS: 4.0000 mg | ORAL_TABLET | Freq: Four times a day (QID) | ORAL | 0 refills | Status: DC | PRN

## 2020-01-24 ENCOUNTER — Ambulatory Visit: Payer: Self-pay | Admitting: Nurse Practitioner

## 2020-01-29 ENCOUNTER — Encounter (HOSPITAL_COMMUNITY): Payer: Self-pay | Admitting: Emergency Medicine

## 2020-01-29 ENCOUNTER — Emergency Department (HOSPITAL_COMMUNITY)
Admission: EM | Admit: 2020-01-29 | Discharge: 2020-01-29 | Disposition: A | Discharge status: Home / Self Care | Payer: 59 | Attending: Emergency Medicine | Admitting: Emergency Medicine

## 2020-01-29 DIAGNOSIS — I10 Essential (primary) hypertension: Secondary | ICD-10-CM | POA: Insufficient documentation

## 2020-01-29 DIAGNOSIS — F17210 Nicotine dependence, cigarettes, uncomplicated: Secondary | ICD-10-CM | POA: Diagnosis not present

## 2020-01-29 DIAGNOSIS — Z79899 Other long term (current) drug therapy: Secondary | ICD-10-CM | POA: Diagnosis not present

## 2020-01-29 DIAGNOSIS — K047 Periapical abscess without sinus: Secondary | ICD-10-CM | POA: Insufficient documentation

## 2020-01-29 DIAGNOSIS — K0889 Other specified disorders of teeth and supporting structures: Secondary | ICD-10-CM | POA: Diagnosis present

## 2020-01-29 MED ORDER — IBUPROFEN 600 MG PO TABS
600.0000 mg | ORAL_TABLET | Freq: Four times a day (QID) | ORAL | 0 refills | Status: DC | PRN
Start: 2020-01-29 — End: 2020-11-26

## 2020-01-29 MED ORDER — CLINDAMYCIN HCL 150 MG PO CAPS
150.0000 mg | ORAL_CAPSULE | Freq: Four times a day (QID) | ORAL | 0 refills | Status: DC
Start: 2020-01-29 — End: 2020-02-27

## 2020-01-29 MED FILL — CLINDAMYCIN HCL 150 MG CAPS: 150 | 7 days supply | Qty: 28 | Fill #0

## 2020-01-29 MED FILL — IBUPROFEN 600 MG TABLET: 600 | 7 days supply | Qty: 30 | Fill #0

## 2020-01-29 NOTE — ED Triage Notes (Signed)
Pt. Stated, I have a tooth that is broken off.on the left side.

## 2020-01-29 NOTE — ED Provider Notes (Signed)
[facility] EMERGENCY DEPARTMENT Provider Note   CSN: [id number] Arrival date & time: 01/29/20  1029     History Chief Complaint  Patient presents with  . Dental Pain    Matthew Scott is a 39 y.o. male.  The history is provided by the patient. No language interpreter was used.  Dental Pain Associated symptoms: facial swelling   Associated symptoms: no fever      38 year old male with hx of polysubstance abuse, gastroparesis, GERD, presenting for evaluation of dental pain.  Patient report 3 days ago he accidentally broke up portion of his left tooth while chewing.  States that the tooth was very loose prior to that.  Since then he developed gradual onset of throbbing sharp 8 of 10 to the affected area worse with chewing and worse with temperature changes.  He tried gargling with salt water and Listerine without adequate relief.  Does not complain of any fever or chills no sore throat no ear pain no loss of hearing and no neck pain.  He does not have a Education officer, community.   Past Medical History:  Diagnosis Date  . 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 Chronic abdominal pain   . Cyclical vomiting syndrome   . GASTRITIS 12/09/2009   Qualifier: Diagnosis of  By: Daphine Deutscher FNP, Zena Amos    . Gastroparesis   . 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 Nausea and vomiting    chronic, recurrent  . Polysubstance abuse [facility])     Patient Active Problem List   Diagnosis Date Noted  . 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 Intractable nausea and vomiting 08/04/2018  . Elevated AST (SGOT) 08/04/2018  . Uncontrollable vomiting 03/10/2016  . Anemia 03/10/2016  . Metatarsal fracture 12/22/2015  . Essential hypertension 12/22/2015  . Night sweats 01/21/2015  . GERD (gastroesophageal reflux disease)  01/21/2015  . Gastritis 12/19/2014  . Hyperbilirubinemia 12/19/2014  . Elevated bilirubin   . Nausea & vomiting 12/14/2014  . Severe tetrahydrocannabinol (THC) dependence (HCC) 12/14/2014  . Alcohol abuse 12/14/2014  . Cyclic vomiting syndrome 12/06/2014  . 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 Gastroparesis 05/11/2012  . Abdominal pain 05/06/2012  . Polysubstance abuse (HCC) 05/06/2012  . CHOLECYSTECTOMY, HX OF 11/25/2009    Past Surgical History:  Procedure Laterality Date  . CHOLECYSTECTOMY  11/2009       Family History  Problem Relation Age of Onset  . Other Father   . Diabetes Father   . Hypertension Father     Social History   Tobacco Use  . Smoking status: Current Some Day Smoker    Years: 8.00    Types: Cigars    Last attempt to quit: 12/15/2014    Years since quitting: 5.1  . Smokeless tobacco: Never Used  Vaping Use  . Vaping Use: Never used  Substance Use Topics  . Alcohol use: No    Alcohol/week: 0.0 standard drinks  . Drug use: Yes    Types: Marijuana    Comment: Last used: 4 days ago per patient on 04/30/2018    Home Medications Prior to Admission medications   Medication Sig Start Date End Date Taking? Authorizing Provider  capsaicin (ZOSTRIX) 0.025 % cream Apply topically 2 (two) times daily. 01/14/20   Jonah Blue  B, MD  dicyclomine (BENTYL) 20 MG tablet Take 1 tablet (20 mg total) by mouth daily. 01/14/20   Marcine Matar, MD  ondansetron (ZOFRAN) 4 MG tablet Take 1 tablet (4 mg total) by mouth every 6 (six) hours as needed for nausea. 01/14/20   Marcine Matar, MD  promethazine (PHENERGAN) 25 MG suppository Place 1 suppository (25 mg total) rectally every 6 (six) hours as needed for nausea or vomiting. 04/05/19   Law, Waylan Boga, PA-C  sucralfate (CARAFATE) 1 g tablet Take 1 tablet (1 g total) by mouth in the morning, at noon, and at bedtime. 01/14/20   Marcine Matar, MD    Allergies    Patient  has no known allergies.  Review of Systems   Review of Systems  Constitutional: Negative for fever.  HENT: Positive for dental problem and facial swelling.     Physical Exam Updated Vital Signs BP 125/79 (BP Location: Left Arm)   Pulse 76   Temp 98.3 F (36.8 C) (Oral)   Resp 15   Ht 5\' 9"  (1.753 m)   Wt 83.9 kg   SpO2 98%   BMI 27.32 kg/m   Physical Exam Vitals and nursing note reviewed.  Constitutional:      General: He is not in acute distress.    Appearance: He is well-developed.  HENT:     Head: Atraumatic.     Mouth/Throat:     Comments: Mouth: Widespread dental decay with significant dental decay noted to tooth #16,17 and 18.  Area is tender to palpation.  Adjacent facial swelling appreciated.  No obvious abscess amenable for drainage.  No trismus.  Uvula midline no tonsillar enlargement or exudates. Eyes:     Conjunctiva/sclera: Conjunctivae normal.  Musculoskeletal:     Cervical back: Neck supple. No rigidity.  Lymphadenopathy:     Cervical: Cervical adenopathy present.  Skin:    Findings: No rash.  Neurological:     Mental Status: He is alert.     ED Results / Procedures / Treatments   Labs (all labs ordered are listed, but only abnormal results are displayed) Labs Reviewed - No data to display  EKG None  Radiology No results found.  Procedures Procedures (including critical care time)  Medications Ordered in ED Medications - No data to display  ED Course  I have reviewed the triage vital signs and the nursing notes.  Pertinent labs & imaging results that were available during my care of the patient were reviewed by me and considered in my medical decision making (see chart for details).    MDM Rules/Calculators/A&P                          BP 125/79 (BP Location: Left Arm)   Pulse 76   Temp 98.3 F (36.8 C) (Oral)   Resp 15   Ht 5\' 9"  (1.753 m)   Wt 83.9 kg   SpO2 98%   BMI 27.32 kg/m   Final Clinical Impression(s) / ED  Diagnoses Final diagnoses:  Periapical abscess with facial involvement    Rx / DC Orders ED Discharge Orders         Ordered    clindamycin (CLEOCIN) 150 MG capsule  Every 6 hours     Discontinue  Reprint     01/29/20 1219    ibuprofen (ADVIL) 600 MG tablet  Every 6 hours PRN     Discontinue  Reprint  01/29/20 1219         12:17 PM Patient here with dental pain and associated facial swelling.  This is likely to be an odontogenic infection such as periapical abscess with facial involvement.  Initiate antibiotic, anti-inflammatory medication, and will give dental referral.   Domenic Moras, PA-C 01/29/20 1219    Blanchie Dessert, MD 01/29/20 2137

## 2020-02-06 ENCOUNTER — Other Ambulatory Visit: Payer: Self-pay | Admitting: Internal Medicine

## 2020-02-06 DIAGNOSIS — R1115 Cyclical vomiting syndrome unrelated to migraine: Secondary | ICD-10-CM

## 2020-02-13 ENCOUNTER — Emergency Department (HOSPITAL_COMMUNITY)
Admission: EM | Admit: 2020-02-13 | Discharge: 2020-02-13 | Disposition: A | Discharge status: Home / Self Care | Payer: 59 | Attending: Emergency Medicine | Admitting: Emergency Medicine

## 2020-02-13 ENCOUNTER — Other Ambulatory Visit: Payer: Self-pay

## 2020-02-13 ENCOUNTER — Encounter (HOSPITAL_COMMUNITY): Payer: Self-pay | Admitting: Emergency Medicine

## 2020-02-13 DIAGNOSIS — R1011 Right upper quadrant pain: Secondary | ICD-10-CM | POA: Insufficient documentation

## 2020-02-13 DIAGNOSIS — Z79899 Other long term (current) drug therapy: Secondary | ICD-10-CM | POA: Diagnosis not present

## 2020-02-13 DIAGNOSIS — R110 Nausea: Secondary | ICD-10-CM | POA: Insufficient documentation

## 2020-02-13 DIAGNOSIS — Z720 Tobacco use: Secondary | ICD-10-CM | POA: Insufficient documentation

## 2020-02-13 DIAGNOSIS — R1013 Epigastric pain: Secondary | ICD-10-CM | POA: Insufficient documentation

## 2020-02-13 DIAGNOSIS — R109 Unspecified abdominal pain: Secondary | ICD-10-CM

## 2020-02-13 LAB — CBC
HCT: 39.8 % (ref 39.0–52.0)
Hemoglobin: 12.8 g/dL — ABNORMAL LOW (ref 13.0–17.0)
MCH: 31.6 pg (ref 26.0–34.0)
MCHC: 32.2 g/dL (ref 30.0–36.0)
MCV: 98.3 fL (ref 80.0–100.0)
Platelets: 267 10*3/uL (ref 150–400)
RBC: 4.05 MIL/uL — ABNORMAL LOW (ref 4.22–5.81)
RDW: 11.9 % (ref 11.5–15.5)
WBC: 7.1 10*3/uL (ref 4.0–10.5)
nRBC: 0 % (ref 0.0–0.2)

## 2020-02-13 LAB — COMPREHENSIVE METABOLIC PANEL
ALT: 20 U/L (ref 0–44)
AST: 25 U/L (ref 15–41)
Albumin: 4 g/dL (ref 3.5–5.0)
Alkaline Phosphatase: 59 U/L (ref 38–126)
Anion gap: 9 (ref 5–15)
BUN: 12 mg/dL (ref 6–20)
CO2: 27 mmol/L (ref 22–32)
Calcium: 9.4 mg/dL (ref 8.9–10.3)
Chloride: 106 mmol/L (ref 98–111)
Creatinine, Ser: 0.99 mg/dL (ref 0.61–1.24)
GFR calc Af Amer: >60 mL/min (ref >60)
GFR calc non Af Amer: >60 mL/min (ref >60)
Glucose, Bld: 112 mg/dL — ABNORMAL HIGH (ref 70–99)
Potassium: 3.4 mmol/L — ABNORMAL LOW (ref 3.5–5.1)
Sodium: 142 mmol/L (ref 135–145)
Total Bilirubin: 2 mg/dL — ABNORMAL HIGH (ref 0.3–1.2)
Total Protein: 7.2 g/dL (ref 6.5–8.1)

## 2020-02-13 LAB — LIPASE, BLOOD: Lipase: 21 U/L (ref 11–51)

## 2020-02-13 MED ORDER — SODIUM CHLORIDE 0.9% FLUSH: 3.0000 mL | Freq: Once | INTRAVENOUS | Status: DC

## 2020-02-13 MED ORDER — PANTOPRAZOLE SODIUM 20 MG PO TBEC
20.0000 mg | DELAYED_RELEASE_TABLET | Freq: Every day | ORAL | 0 refills | Status: DC
Start: 2020-02-13 — End: 2020-02-27

## 2020-02-13 NOTE — ED Provider Notes (Signed)
[facility] EMERGENCY DEPARTMENT Provider Note   CSN: [id number] Arrival date & time: 02/13/20  3614     History No chief complaint on file.   Matthew Scott is a 39 y.o. male.   Abdominal Pain Pain location:  RUQ and epigastric Pain quality: aching   Pain radiates to:  Does not radiate Pain severity:  Mild Timing:  Intermittent Progression:  Waxing and waning Chronicity:  New Relieved by:  Nothing Worsened by:  Nothing Ineffective treatments:  None tried Associated symptoms: nausea   Associated symptoms: no anorexia, no chest pain, no chills, no constipation, no cough, no diarrhea, no dysuria, no fever, no hematuria, no shortness of breath, no sore throat and no vomiting        Past Medical History:  Diagnosis Date  . 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 Chronic abdominal pain   . Cyclical vomiting syndrome   . GASTRITIS 12/09/2009   Qualifier: Diagnosis of  By: Daphine Deutscher FNP, Zena Amos    . Gastroparesis   . 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 Nausea and vomiting    chronic, recurrent  . Polysubstance abuse [facility])     Patient Active Problem List   Diagnosis Date Noted  . 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 Intractable nausea and vomiting 08/04/2018  . Elevated AST (SGOT) 08/04/2018  . Uncontrollable vomiting 03/10/2016  . Anemia 03/10/2016  . Metatarsal fracture 12/22/2015  . Essential hypertension 12/22/2015  . Night sweats 01/21/2015  . GERD (gastroesophageal reflux disease) 01/21/2015  . Gastritis 12/19/2014  . Hyperbilirubinemia 12/19/2014  . Elevated bilirubin   . Nausea & vomiting 12/14/2014  . Severe tetrahydrocannabinol (THC) dependence (HCC) 12/14/2014  . Alcohol abuse 12/14/2014  . Cyclic vomiting syndrome 12/06/2014  . 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 Gastroparesis 05/11/2012  . Abdominal pain 05/06/2012  . Polysubstance abuse (HCC) 05/06/2012  . CHOLECYSTECTOMY, HX OF 11/25/2009    Past Surgical History:  Procedure Laterality Date  . CHOLECYSTECTOMY  11/2009       Family History  Problem Relation Age of Onset  . Other Father   . Diabetes Father   . Hypertension Father     Social History   Tobacco Use  . Smoking status: Current Some Day Smoker    Years: 8.00    Types: Cigars    Last attempt to quit: 12/15/2014    Years since quitting: 5.1  . Smokeless tobacco: Never Used  Vaping Use  . Vaping Use: Never used  Substance Use Topics  . Alcohol use: No    Alcohol/week: 0.0 standard drinks  . Drug use: Yes    Types: Marijuana    Comment: Last used: 4 days ago per patient on 04/30/2018    Home Medications Prior to Admission medications   Medication Sig Start Date End Date Taking? Authorizing Provider  clindamycin (CLEOCIN) 150 MG capsule Take 1 capsule (150 mg total) by mouth every 6 (six) hours. 01/29/20   Fayrene Helper, PA-C  dicyclomine (BENTYL) 20 MG tablet TAKE 1 TABLET BY MOUTH EVERY DAY 02/06/20   Hoy Register, MD  ibuprofen (ADVIL) 600 MG tablet Take 1 tablet (600 mg total) by mouth every 6 (six) hours as needed. 01/29/20   Fayrene Helper, PA-C  ondansetron (ZOFRAN) 4 MG tablet  Take 1 tablet (4 mg total) by mouth every 6 (six) hours as needed for nausea. 01/14/20   Marcine Matar, MD  pantoprazole (PROTONIX) 20 MG tablet Take 1 tablet (20 mg total) by mouth daily. 02/13/20 03/14/20  Sabino Donovan, MD  sucralfate (CARAFATE) 1 g tablet TAKE 1 TABLET (1 G TOTAL) BY MOUTH IN THE MORNING, AT NOON, AND AT BEDTIME. 02/06/20   Hoy Register, MD  promethazine (PHENERGAN) 25 MG suppository Place 1 suppository (25 mg total) rectally every 6 (six) hours as needed for nausea or vomiting. Patient not taking: Reported on 01/29/2020 04/05/19 01/29/20  Emi Holes, PA-C      Allergies    Patient has no known allergies.  Review of Systems   Review of Systems  Constitutional: Negative for chills, fever and unexpected weight change.  HENT: Negative for ear pain and sore throat.   Eyes: Negative for pain and visual disturbance.  Respiratory: Negative for cough and shortness of breath.   Cardiovascular: Negative for chest pain and palpitations.  Gastrointestinal: Positive for abdominal pain and nausea. Negative for abdominal distention, anal bleeding, anorexia, blood in stool, constipation, diarrhea, rectal pain and vomiting.  Genitourinary: Negative for dysuria and hematuria.  Musculoskeletal: Negative for arthralgias and back pain.  Skin: Negative for color change and rash.  Neurological: Negative for light-headedness and headaches.  All other systems reviewed and are negative.   Physical Exam Updated Vital Signs BP 113/77 (BP Location: Left Arm)   Pulse 62   Temp 99 F (37.2 C) (Oral)   Resp 16   Ht 5\' 7"  (1.702 m)   Wt 83.9 kg   SpO2 100%   BMI 28.98 kg/m   Physical Exam Vitals and nursing note reviewed. Exam conducted with a chaperone present.  Constitutional:      Appearance: He is well-developed.  HENT:     Head: Normocephalic and atraumatic.  Eyes:     Conjunctiva/sclera: Conjunctivae normal.  Cardiovascular:     Rate and Rhythm: Normal rate and regular rhythm.     Heart sounds: No murmur heard.   Pulmonary:     Effort: Pulmonary effort is normal. No respiratory distress.     Breath sounds: Normal breath sounds.  Abdominal:     Palpations: Abdomen is soft.     Tenderness: There is abdominal tenderness in the epigastric area. There is no guarding or rebound. Negative signs include Murphy's sign and Rovsing's sign.  Musculoskeletal:     Cervical back: Neck supple.  Skin:    General: Skin is warm and dry.  Neurological:     Mental Status: He is alert.     ED Results / Procedures / Treatments   Labs (all labs ordered are  listed, but only abnormal results are displayed) Labs Reviewed  COMPREHENSIVE METABOLIC PANEL - Abnormal; Notable for the following components:      Result Value   Potassium 3.4 (*)    Glucose, Bld 112 (*)    Total Bilirubin 2.0 (*)    All other components within normal limits  CBC - Abnormal; Notable for the following components:   RBC 4.05 (*)    Hemoglobin 12.8 (*)    All other components within normal limits  LIPASE, BLOOD    EKG None  Radiology No results found.  Procedures Procedures (including critical care time)  Medications Ordered in ED Medications  sodium chloride flush (NS) 0.9 % injection 3 mL (has no administration in time range)    ED Course  I have reviewed the triage vital signs and the nursing notes.  Pertinent labs & imaging results that were available during my care of the patient were reviewed by me and considered in my medical decision making (see chart for details).    MDM Rules/Calculators/A&P                          Acute on chronic ab pain with exacerbation today that is now resolved. Similar pain for 7 years, home meds are working intermittently. Ab exam is benign, no signs of peritonitis or other acute life threatening disease. Labs reviewed by myself are without acute abnormalities or concnerns, mild elvation in T bili that is similar to previous. Pt is counseled on return precautions and need for further out patient work up. Pt agrees to the plan and is safe for DC.    Final Clinical Impression(s) / ED Diagnoses Final diagnoses:  Undifferentiated abdominal pain    Rx / DC Orders ED Discharge Orders         Ordered    pantoprazole (PROTONIX) 20 MG tablet  Daily     Discontinue  Reprint     02/13/20 1427           Sabino Donovan, MD 02/13/20 1432

## 2020-02-13 NOTE — ED Triage Notes (Signed)
Pt here from work with c/o abd pain and n/v , pt took some Zofran and Pepto without relief  , no gall bladder

## 2020-02-26 NOTE — Progress Notes (Signed)
Patient ID: Matthew Scott, male   DOB: 03/05/1981, 39 y.o.   MRN: [id number]   Virtual Visit via Telephone Note  I connected with Matthew Scott on 02/27/20 at  3:10 PM EDT by telephone and verified that I am speaking with the correct person using two identifiers.   I discussed the limitations, risks, security and privacy concerns of performing an evaluation and management service by telephone and the availability of in person appointments. I also discussed with the patient that there may be a patient responsible charge related to this service. The patient expressed understanding and agreed to proceed.  PATIENT visit by telephone virtually in the context of Covid-19 pandemic. Patient location:  home My Location:  [facility] office Persons on the call:  Me and the patient    History of Present Illness: After ED visit 02/13/2020 for abdominal pain. Pain in R to umbilical cord and has been occurring for several years.  Pain is better since starting meds.  No fever.  No vomiting in the last several weeks.  No melena/hematochezia.  Pain is cramping and burning.  Pain comes and goes.  Appetite is good.    From ED A/P: Acute on chronic ab pain with exacerbation today that is now resolved. Similar pain for 7 years, home meds are working intermittently. Ab exam is benign, no signs of peritonitis or other acute life threatening disease. Labs reviewed by myself are without acute abnormalities or concnerns, mild elvation in T bili that is similar to previous. Pt is counseled on return precautions and need for further out patient work up. Pt agrees to the plan and is safe for DC  Observations/Objective:  NAD.  A&Ox3   Assessment and Plan: 1. Cyclic vomiting syndrome - ondansetron (ZOFRAN) 4 MG tablet; Take 1 tablet (4 mg total) by mouth every 6 (six) hours as needed for nausea.  Dispense: 30 tablet; Refill: 1 - sucralfate (CARAFATE) 1 g tablet; Take 1 tablet (1 g total) by mouth in the morning, at noon, and  at bedtime.  Dispense: 60 tablet; Refill: 2  2. Gastritis without bleeding, unspecified chronicity, unspecified gastritis type - pantoprazole (PROTONIX) 20 MG tablet; Take 1 tablet (20 mg total) by mouth daily.  Dispense: 30 tablet; Refill: 2 - sucralfate (CARAFATE) 1 g tablet; Take 1 tablet (1 g total) by mouth in the morning, at noon, and at bedtime.  Dispense: 60 tablet; Refill: 2  3. Encounter for examination following treatment at hospital  4. Hyperbilirubinemia Avoid alcohol.  Drink more water    Follow Up Instructions: See PCP prn   I discussed the assessment and treatment plan with the patient. The patient was provided an opportunity to ask questions and all were answered. The patient agreed with the plan and demonstrated an understanding of the instructions.   The patient was advised to call back or seek an in-person evaluation if the symptoms worsen or if the condition fails to improve as anticipated.  I provided 9 minutes of non-face-to-face time during this encounter.   Georgian Co, PA-C

## 2020-02-27 ENCOUNTER — Encounter: Payer: Self-pay | Admitting: Physician Assistant

## 2020-02-27 ENCOUNTER — Other Ambulatory Visit: Payer: Self-pay

## 2020-02-27 ENCOUNTER — Ambulatory Visit: Payer: 59 | Attending: Physician Assistant | Admitting: Physician Assistant

## 2020-02-27 DIAGNOSIS — R1115 Cyclical vomiting syndrome unrelated to migraine: Secondary | ICD-10-CM

## 2020-02-27 DIAGNOSIS — Z09 Encounter for follow-up examination after completed treatment for conditions other than malignant neoplasm: Secondary | ICD-10-CM | POA: Diagnosis not present

## 2020-02-27 DIAGNOSIS — K2970 Gastritis, unspecified, without bleeding: Secondary | ICD-10-CM | POA: Diagnosis not present

## 2020-02-27 MED ORDER — ONDANSETRON HCL 4 MG PO TABS: 4.0000 mg | ORAL_TABLET | Freq: Four times a day (QID) | ORAL | 1 refills | Status: DC | PRN

## 2020-02-27 MED ORDER — SUCRALFATE 1 G PO TABS: 1.0000 g | ORAL_TABLET | Freq: Three times a day (TID) | ORAL | 2 refills | Status: DC

## 2020-02-27 MED ORDER — PANTOPRAZOLE SODIUM 20 MG PO TBEC: 20.0000 mg | DELAYED_RELEASE_TABLET | Freq: Every day | ORAL | 2 refills | Status: DC

## 2020-03-18 ENCOUNTER — Encounter (HOSPITAL_COMMUNITY): Payer: Self-pay

## 2020-03-18 ENCOUNTER — Other Ambulatory Visit: Payer: Self-pay

## 2020-03-18 ENCOUNTER — Emergency Department (HOSPITAL_COMMUNITY)
Admission: EM | Admit: 2020-03-18 | Discharge: 2020-03-18 | Disposition: A | Discharge status: Home / Self Care | Payer: 59 | Attending: Emergency Medicine | Admitting: Emergency Medicine

## 2020-03-18 DIAGNOSIS — M62830 Muscle spasm of back: Secondary | ICD-10-CM | POA: Insufficient documentation

## 2020-03-18 DIAGNOSIS — Z5321 Procedure and treatment not carried out due to patient leaving prior to being seen by health care provider: Secondary | ICD-10-CM | POA: Insufficient documentation

## 2020-03-18 NOTE — ED Triage Notes (Signed)
Patient arrived with complaints of lower back muscle spasms that started three days ago that have progressively gotten worse. Declines any OTC pain medication today. Ambulatory with assistance for EMS

## 2020-04-08 ENCOUNTER — Emergency Department (HOSPITAL_COMMUNITY)
Admission: EM | Admit: 2020-04-08 | Discharge: 2020-04-08 | Disposition: A | Discharge status: Home / Self Care | Payer: 59 | Attending: Emergency Medicine | Admitting: Emergency Medicine

## 2020-04-08 ENCOUNTER — Encounter (HOSPITAL_COMMUNITY): Payer: Self-pay | Admitting: Emergency Medicine

## 2020-04-08 DIAGNOSIS — R110 Nausea: Secondary | ICD-10-CM | POA: Insufficient documentation

## 2020-04-08 DIAGNOSIS — Z5321 Procedure and treatment not carried out due to patient leaving prior to being seen by health care provider: Secondary | ICD-10-CM | POA: Diagnosis not present

## 2020-04-08 DIAGNOSIS — R109 Unspecified abdominal pain: Secondary | ICD-10-CM | POA: Insufficient documentation

## 2020-04-08 LAB — COMPREHENSIVE METABOLIC PANEL
ALT: 21 U/L (ref 0–44)
AST: 27 U/L (ref 15–41)
Albumin: 4.1 g/dL (ref 3.5–5.0)
Alkaline Phosphatase: 69 U/L (ref 38–126)
Anion gap: 8 (ref 5–15)
BUN: 9 mg/dL (ref 6–20)
CO2: 24 mmol/L (ref 22–32)
Calcium: 9.2 mg/dL (ref 8.9–10.3)
Chloride: 109 mmol/L (ref 98–111)
Creatinine, Ser: 0.94 mg/dL (ref 0.61–1.24)
GFR calc Af Amer: >60 mL/min (ref >60)
GFR calc non Af Amer: >60 mL/min (ref >60)
Glucose, Bld: 98 mg/dL (ref 70–99)
Potassium: 3.7 mmol/L (ref 3.5–5.1)
Sodium: 141 mmol/L (ref 135–145)
Total Bilirubin: 1.1 mg/dL (ref 0.3–1.2)
Total Protein: 7.5 g/dL (ref 6.5–8.1)

## 2020-04-08 LAB — CBC
HCT: 36.2 % — ABNORMAL LOW (ref 39.0–52.0)
Hemoglobin: 12.1 g/dL — ABNORMAL LOW (ref 13.0–17.0)
MCH: 33.6 pg (ref 26.0–34.0)
MCHC: 33.4 g/dL (ref 30.0–36.0)
MCV: 100.6 fL — ABNORMAL HIGH (ref 80.0–100.0)
Platelets: 191 10*3/uL (ref 150–400)
RBC: 3.6 MIL/uL — ABNORMAL LOW (ref 4.22–5.81)
RDW: 12.9 % (ref 11.5–15.5)
WBC: 6.3 10*3/uL (ref 4.0–10.5)
nRBC: 0 % (ref 0.0–0.2)

## 2020-04-08 LAB — LIPASE, BLOOD: Lipase: 19 U/L (ref 11–51)

## 2020-04-08 NOTE — ED Triage Notes (Signed)
Per [facility] pt from home for abd pains that hurts every morning. Reports using marijuana that normally helps but didn't today. Having some nausea.  Vitals: 165/76, 114/70, HR 78, 100% on RA.
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
## 2020-04-23 ENCOUNTER — Other Ambulatory Visit: Payer: Self-pay | Admitting: *Deleted

## 2020-04-23 ENCOUNTER — Encounter: Payer: Self-pay | Admitting: *Deleted

## 2020-04-23 NOTE — Patient Outreach (Addendum)
[facility]) Care Management  04/23/2020  KENT RIENDEAU 09/06/1980 [id number]   Referral Date: 9/8 Referral Source: Insurance Referral Reason: Frequent ED visits, 7 in the last 6 months Insurance: [facility]   Outreach attempt #1, successful.  Identity verified.  This care manager introduced self and stated purpose of call.  [facility] care management services explained.    Social: Member reports he is independent with all ADL's with the exception of when he is having abdominal pain.  He lives between his mother's and his best friend's home.  Reports he does not have a job currently due to missing shifts for his GI issues, but state he would love to start working again.  Conditions: Per chart, has history of HTN, cannabis hyperemesis syndrome, GERD, and anemia.  Discussed the use of cannabis and the effect it is having on his health, state he understands but feels it helps him sometimes, despite having severe GI issues days after use.  Report his last use was about 4 days ago.  Medications: Reviewed with member, state he is compliant with regime, denies need of financial assistance to pay for meds.  State he was provided a cream at the ED that really helps with his discomfort, will as his PCP for a refill.  Appointments: Had telemedicine visit with PCP office on 7/15, recommended follow up PRN.  Per PCP note in May, ambulatory referral was placed to GI.  Member denies being seen by specialist, unsure where the referral was placed.  Call was placed to PCP office to inquire, notified that referral was placed to [facility], office visit was made back in June but member was a no show.  Member aware of need to reschedule, state he will call office to do so.  Contact information provided to member.    Denies any urgent concerns, discussed use of PCP office and this care manager prior to visiting ED.  Advised to contact this care manager with questions.  Plan: RN CM will follow up  with member within the next 2 weeks.  Goals Addressed              This Visit's Progress   .  [facility] - Be stable enough to get a job and work (pt-stated)        CARE PLAN ENTRY (see longitudinal plan of care for additional care plan information)  Current Barriers:  [name] Knowledge Deficits related to management of chronic GI issues (cannabis hyperemesis syndrome . Corporate treasurer.   Nurse Case Manager Clinical Goal(s):  [name] Over the next 28 days, patient will attend all scheduled medical appointments: PCP and GI . Over the next 31 days, patient will experience decrease in ED visits. ED visits in last 6 months = 7 . Over the next 28 days, the patient will demonstrate ongoing self health care management ability as evidenced by ability to find employment*  Interventions:  . Inter-disciplinary care team collaboration (see longitudinal plan of care) . Advised patient to contact PCP office or this care manager prior to ED visit . Discussed plans with patient for ongoing care management follow up and provided patient with direct contact information for care management team . Reviewed scheduled/upcoming provider appointments including:   Patient Self Care Activities:  . Attends all scheduled provider appointments . Performs ADL's independently . Does not attend all scheduled provider appointments  Initial goal documentation       Kemper Durie, RN, MSN [facility] Care Management  [facility] Manager [phone]

## 2020-05-01 ENCOUNTER — Other Ambulatory Visit: Payer: Self-pay | Admitting: *Deleted

## 2020-05-01 NOTE — Patient Outreach (Signed)
[facility]) Care Management  05/01/2020  Matthew Scott 05/05/1981 [id number]   Member's case discussed with multidisciplinary team due to high ED utilization.  Update provided on assessment and plan of care.  Will follow up with member within the next week as planned, will plan to place referral to CSW for counseling as well as referral to [facility] for substance abuse assistance.  Kemper Durie, [place], MSN [facility] Care Management  [facility] Manager [phone]

## 2020-05-08 ENCOUNTER — Encounter (HOSPITAL_COMMUNITY): Payer: Self-pay | Admitting: Emergency Medicine

## 2020-05-08 ENCOUNTER — Emergency Department (HOSPITAL_COMMUNITY)
Admission: EM | Admit: 2020-05-08 | Discharge: 2020-05-08 | Disposition: A | Discharge status: Home / Self Care | Payer: 59 | Attending: Emergency Medicine | Admitting: Emergency Medicine

## 2020-05-08 ENCOUNTER — Other Ambulatory Visit: Payer: Self-pay | Admitting: *Deleted

## 2020-05-08 DIAGNOSIS — F17290 Nicotine dependence, other tobacco product, uncomplicated: Secondary | ICD-10-CM | POA: Diagnosis not present

## 2020-05-08 DIAGNOSIS — R1115 Cyclical vomiting syndrome unrelated to migraine: Secondary | ICD-10-CM | POA: Insufficient documentation

## 2020-05-08 DIAGNOSIS — K219 Gastro-esophageal reflux disease without esophagitis: Secondary | ICD-10-CM | POA: Insufficient documentation

## 2020-05-08 DIAGNOSIS — R109 Unspecified abdominal pain: Secondary | ICD-10-CM | POA: Diagnosis present

## 2020-05-08 LAB — COMPREHENSIVE METABOLIC PANEL
ALT: 16 U/L (ref 0–44)
AST: 22 U/L (ref 15–41)
Albumin: 4.3 g/dL (ref 3.5–5.0)
Alkaline Phosphatase: 65 U/L (ref 38–126)
Anion gap: 12 (ref 5–15)
BUN: 10 mg/dL (ref 6–20)
CO2: 26 mmol/L (ref 22–32)
Calcium: 9.7 mg/dL (ref 8.9–10.3)
Chloride: 105 mmol/L (ref 98–111)
Creatinine, Ser: 0.91 mg/dL (ref 0.61–1.24)
GFR calc Af Amer: >60 mL/min (ref >60)
GFR calc non Af Amer: >60 mL/min (ref >60)
Glucose, Bld: 97 mg/dL (ref 70–99)
Potassium: 4.1 mmol/L (ref 3.5–5.1)
Sodium: 143 mmol/L (ref 135–145)
Total Bilirubin: 1.4 mg/dL — ABNORMAL HIGH (ref 0.3–1.2)
Total Protein: 7.4 g/dL (ref 6.5–8.1)

## 2020-05-08 LAB — CBC
HCT: 37.6 % — ABNORMAL LOW (ref 39.0–52.0)
Hemoglobin: 12.5 g/dL — ABNORMAL LOW (ref 13.0–17.0)
MCH: 33.2 pg (ref 26.0–34.0)
MCHC: 33.2 g/dL (ref 30.0–36.0)
MCV: 100 fL (ref 80.0–100.0)
Platelets: 312 10*3/uL (ref 150–400)
RBC: 3.76 MIL/uL — ABNORMAL LOW (ref 4.22–5.81)
RDW: 13.3 % (ref 11.5–15.5)
WBC: 7.8 10*3/uL (ref 4.0–10.5)
nRBC: 0 % (ref 0.0–0.2)

## 2020-05-08 LAB — LIPASE, BLOOD: Lipase: 21 U/L (ref 11–51)

## 2020-05-08 MED ORDER — ONDANSETRON HCL 4 MG/2ML IJ SOLN
4.0000 mg | Freq: Once | INTRAMUSCULAR | Status: AC
  Administered 2020-05-08: 4 mg via INTRAVENOUS
  Filled 2020-05-08: qty 2

## 2020-05-08 NOTE — ED Notes (Signed)
Patient made aware to let staff know if he decides to leave so IV can be removed. Patient verbalized understanding.

## 2020-05-08 NOTE — ED Triage Notes (Signed)
Per EMS, patient from home, abdominal pain with N/V since 0730 this morning. Hx of same.  18g L AC fentanyl with EMS

## 2020-05-08 NOTE — Discharge Instructions (Signed)
Continue taking the nausea medication that you have at home as needed to help with your symptoms. Your lab work did not show any concerning findings. Make sure you are drinking plenty of fluids and slowly advancing your diet as tolerated. Return to the ER if you start to experience worsening pain, fever, chest pain or shortness of breath.

## 2020-05-08 NOTE — Patient Outreach (Signed)
[facility]) Care Management  05/08/2020  Matthew Scott [birth year] [id number]   Call placed to member to follow up on GI issues and to complete initial assessment.  No answer, unable to leave message.  Will follow up within the next 3-4 business days.  Kemper Durie, [place], MSN [facility] Care Management  [facility] Manager [phone]

## 2020-05-08 NOTE — ED Provider Notes (Signed)
[facility]-EMERGENCY DEPT Provider Note   CSN: [id number] Arrival date & time: 05/08/20  1340     History Chief Complaint  Patient presents with  . Abdominal Pain  . Emesis    Matthew Scott is a 39 y.o. 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  Symptoms began approximately 6 hours ago.  Denies any suspicious food intake or changes to bowel movements.  Has antiemetics at home but did not take it.  Reports similar symptoms in the past.  Reports ongoing marijuana use.  Denies any alcohol use or urinary symptoms.  No sick contacts with similar symptoms, chest pain, shortness of breath, fever.  HPI     Past Medical History:  Diagnosis Date  . 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 Chronic abdominal pain   . Cyclical vomiting syndrome   . GASTRITIS 12/09/2009   Qualifier: Diagnosis of  By: Daphine Deutscher FNP, Zena Amos    . Gastroparesis   . 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 Nausea and vomiting    chronic, recurrent  . Polysubstance abuse [facility])     Patient Active Problem List   Diagnosis Date Noted  . 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 Intractable nausea and vomiting 08/04/2018  . Elevated AST (SGOT) 08/04/2018  . Uncontrollable vomiting 03/10/2016  . Anemia 03/10/2016  . Metatarsal fracture 12/22/2015  . Essential hypertension 12/22/2015  . Night sweats 01/21/2015  . GERD (gastroesophageal reflux disease) 01/21/2015  . Gastritis 12/19/2014  . Hyperbilirubinemia 12/19/2014  . Elevated bilirubin   . Nausea & vomiting 12/14/2014  . Severe tetrahydrocannabinol (THC) dependence (HCC) 12/14/2014  . Alcohol abuse 12/14/2014  . Cyclic vomiting  syndrome 12/06/2014  . 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 Gastroparesis 05/11/2012  . Abdominal pain 05/06/2012  . Polysubstance abuse (HCC) 05/06/2012  . CHOLECYSTECTOMY, HX OF 11/25/2009    Past Surgical History:  Procedure Laterality Date  . CHOLECYSTECTOMY  11/2009       Family History  Problem Relation Age of Onset  . Other Father   . Diabetes Father   . Hypertension Father     Social History   Tobacco Use  . Smoking status: Current Some Day Smoker    Years: 8.00    Types: Cigars    Last attempt to quit: 12/15/2014    Years since quitting: 5.4  . Smokeless tobacco: Never Used  Vaping Use  . Vaping Use: Never used  Substance Use Topics  . Alcohol use: No    Alcohol/week: 0.0 standard drinks  . Drug use: Yes    Types: Marijuana    Comment: Last used: 4 days ago per patient on 04/30/2018    Home Medications Prior to Admission medications   Medication Sig Start Date End Date Taking? Authorizing Provider  dicyclomine (BENTYL) 20 MG tablet TAKE 1 TABLET BY MOUTH EVERY DAY 02/06/20   Hoy Register, MD  ibuprofen (ADVIL) 600 MG tablet Take 1 tablet (600 mg total) by mouth every 6 (six) hours as needed. 01/29/20   Fayrene Helper, PA-C  ondansetron (ZOFRAN) 4 MG tablet Take 1 tablet (4 mg total) by mouth every 6 (six) hours as needed for nausea.  02/27/20   Anders Simmonds, PA-C  pantoprazole (PROTONIX) 20 MG tablet Take 1 tablet (20 mg total) by mouth daily. 02/27/20 05/27/20  Anders Simmonds, PA-C  sucralfate (CARAFATE) 1 g tablet Take 1 tablet (1 g total) by mouth in the morning, at noon, and at bedtime. 02/27/20   Anders Simmonds, PA-C  promethazine (PHENERGAN) 25 MG suppository Place 1 suppository (25 mg total) rectally every 6 (six) hours as needed for nausea or vomiting. Patient not taking: Reported on 01/29/2020 04/05/19 01/29/20  Emi Holes, PA-C    Allergies    Patient has no known allergies.  Review of Systems     Review of Systems  Constitutional: Negative for appetite change, chills and fever.  HENT: Negative for ear pain, rhinorrhea, sneezing and sore throat.   Eyes: Negative for photophobia and visual disturbance.  Respiratory: Negative for cough, chest tightness, shortness of breath and wheezing.   Cardiovascular: Negative for chest pain and palpitations.  Gastrointestinal: Positive for abdominal pain, nausea and vomiting. Negative for blood in stool, constipation and diarrhea.  Genitourinary: Negative for dysuria, hematuria and urgency.  Musculoskeletal: Negative for myalgias.  Skin: Negative for rash.  Neurological: Negative for dizziness, weakness and light-headedness.    Physical Exam Updated Vital Signs BP 138/77 (BP Location: Left Arm)   Pulse 63   Temp 97.8 F (36.6 C) (Oral)   Resp 16   SpO2 95%   Physical Exam Vitals and nursing note reviewed.  Constitutional:      General: He is not in acute distress.    Appearance: He is well-developed. He is not diaphoretic.  HENT:     Head: Normocephalic and atraumatic.  Eyes:     General: No scleral icterus.    Conjunctiva/sclera: Conjunctivae normal.  Pulmonary:     Effort: Pulmonary effort is normal. No respiratory distress.  Abdominal:     Tenderness: There is no abdominal tenderness.  Musculoskeletal:     Cervical back: Normal range of motion.  Skin:    Findings: No rash.  Neurological:     Mental Status: He is alert.     ED Results / Procedures / Treatments   Labs (all labs ordered are listed, but only abnormal results are displayed) Labs Reviewed  CBC - Abnormal; Notable for the following components:      Result Value   RBC 3.76 (*)    Hemoglobin 12.5 (*)    HCT 37.6 (*)    All other components within normal limits  COMPREHENSIVE METABOLIC PANEL - Abnormal; Notable for the following components:   Total Bilirubin 1.4 (*)    All other components within normal limits  LIPASE, BLOOD     EKG None  Radiology No results found.  Procedures Procedures (including critical care time)  Medications Ordered in ED Medications  ondansetron (ZOFRAN) injection 4 mg (4 mg Intravenous Given 05/08/20 1608)    ED Course  I have reviewed the triage vital signs and the nursing notes.  Pertinent labs & imaging results that were available during my care of the patient were reviewed by me and considered in my medical decision making (see chart for details).    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  6 hours ago began having the symptoms without specific trigger.  He has antiemetics at home but did not take it.  Similar symptoms in the past and he reports ongoing marijuana use.  On exam abdomen is soft, nontender nondistended.  Vital signs within normal limits. Lab work ordered and interpreted. CBC, lipase and CMP unremarkable, slight elevation in T bili similar to prior.  Patient was given Zofran here with his symptoms.  He is tolerating p.o. intake without difficulty or vomiting.  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  Doubt acute surgical or emergent cause such as cholecystitis, appendicitis, pancreatitis. Patient was educated on cessation.  We will have him continue his home antiemetics and return for worsening symptoms.   Patient is hemodynamically stable, in NAD, and able to ambulate in the ED. Evaluation does not show pathology that would require ongoing emergent intervention or inpatient treatment. I explained the diagnosis to the patient. Pain has been managed and has no complaints prior to discharge. Patient is comfortable with above plan and is stable for discharge at this time. All questions were answered prior to disposition. Strict return precautions  for returning to the ED were discussed. Encouraged follow up with PCP.   An After Visit Summary was printed and given to the patient.   Portions of this note were generated with Scientist, clinical (histocompatibility and immunogenetics). Dictation errors may occur despite best attempts at proofreading.  Final Clinical Impression(s) / ED Diagnoses Final diagnoses:  Cyclical vomiting    Rx / DC Orders ED Discharge Orders    None       Dietrich Pates, PA-C 05/08/20 1635    Charlynne Pander, MD 05/08/20 2256

## 2020-05-11 ENCOUNTER — Other Ambulatory Visit: Payer: Self-pay

## 2020-05-11 ENCOUNTER — Emergency Department (HOSPITAL_COMMUNITY)
Admission: EM | Admit: 2020-05-11 | Discharge: 2020-05-11 | Disposition: A | Discharge status: Home / Self Care | Payer: 59 | Attending: Emergency Medicine | Admitting: Emergency Medicine

## 2020-05-11 ENCOUNTER — Encounter (HOSPITAL_COMMUNITY): Payer: Self-pay | Admitting: *Deleted

## 2020-05-11 DIAGNOSIS — R112 Nausea with vomiting, unspecified: Secondary | ICD-10-CM | POA: Diagnosis not present

## 2020-05-11 DIAGNOSIS — R109 Unspecified abdominal pain: Secondary | ICD-10-CM | POA: Insufficient documentation

## 2020-05-11 DIAGNOSIS — K219 Gastro-esophageal reflux disease without esophagitis: Secondary | ICD-10-CM | POA: Diagnosis not present

## 2020-05-11 DIAGNOSIS — R6883 Chills (without fever): Secondary | ICD-10-CM | POA: Insufficient documentation

## 2020-05-11 DIAGNOSIS — F17290 Nicotine dependence, other tobacco product, uncomplicated: Secondary | ICD-10-CM | POA: Insufficient documentation

## 2020-05-11 LAB — COMPREHENSIVE METABOLIC PANEL
ALT: 22 U/L (ref 0–44)
AST: 39 U/L (ref 15–41)
Albumin: 4.6 g/dL (ref 3.5–5.0)
Alkaline Phosphatase: 63 U/L (ref 38–126)
Anion gap: 16 — ABNORMAL HIGH (ref 5–15)
BUN: 12 mg/dL (ref 6–20)
CO2: 21 mmol/L — ABNORMAL LOW (ref 22–32)
Calcium: 9.5 mg/dL (ref 8.9–10.3)
Chloride: 105 mmol/L (ref 98–111)
Creatinine, Ser: 0.98 mg/dL (ref 0.61–1.24)
GFR calc Af Amer: >60 mL/min (ref >60)
GFR calc non Af Amer: >60 mL/min (ref >60)
Glucose, Bld: 105 mg/dL — ABNORMAL HIGH (ref 70–99)
Potassium: 3.5 mmol/L (ref 3.5–5.1)
Sodium: 142 mmol/L (ref 135–145)
Total Bilirubin: 2 mg/dL — ABNORMAL HIGH (ref 0.3–1.2)
Total Protein: 7.6 g/dL (ref 6.5–8.1)

## 2020-05-11 LAB — URINALYSIS, ROUTINE W REFLEX MICROSCOPIC
Bacteria, UA: NONE SEEN
Bilirubin Urine: NEGATIVE
Glucose, UA: NEGATIVE mg/dL
Ketones, ur: 20 mg/dL — AB
Leukocytes,Ua: NEGATIVE
Nitrite: NEGATIVE
Protein, ur: 30 mg/dL — AB
Specific Gravity, Urine: 1.025 (ref 1.005–1.030)
pH: 5 (ref 5.0–8.0)

## 2020-05-11 LAB — CBC WITH DIFFERENTIAL/PLATELET
Abs Immature Granulocytes: 0.03 10*3/uL (ref 0.00–0.07)
Basophils Absolute: 0 10*3/uL (ref 0.0–0.1)
Basophils Relative: 0 %
Eosinophils Absolute: 0 10*3/uL (ref 0.0–0.5)
Eosinophils Relative: 0 %
HCT: 37.4 % — ABNORMAL LOW (ref 39.0–52.0)
Hemoglobin: 12.5 g/dL — ABNORMAL LOW (ref 13.0–17.0)
Immature Granulocytes: 0 %
Lymphocytes Relative: 6 %
Lymphs Abs: 0.5 10*3/uL — ABNORMAL LOW (ref 0.7–4.0)
MCH: 33.3 pg (ref 26.0–34.0)
MCHC: 33.4 g/dL (ref 30.0–36.0)
MCV: 99.7 fL (ref 80.0–100.0)
Monocytes Absolute: 0.3 10*3/uL (ref 0.1–1.0)
Monocytes Relative: 4 %
Neutro Abs: 8.1 10*3/uL — ABNORMAL HIGH (ref 1.7–7.7)
Neutrophils Relative %: 90 %
Platelets: 221 10*3/uL (ref 150–400)
RBC: 3.75 MIL/uL — ABNORMAL LOW (ref 4.22–5.81)
RDW: 13 % (ref 11.5–15.5)
WBC: 8.9 10*3/uL (ref 4.0–10.5)
nRBC: 0 % (ref 0.0–0.2)

## 2020-05-11 LAB — LIPASE, BLOOD: Lipase: 19 U/L (ref 11–51)

## 2020-05-11 MED ORDER — PROMETHAZINE HCL 50 MG PO TABS: 50.0000 mg | ORAL_TABLET | Freq: Four times a day (QID) | ORAL | 0 refills | Status: DC | PRN

## 2020-05-11 MED ORDER — SODIUM CHLORIDE 0.9 % IV BOLUS
500.0000 mL | Freq: Once | INTRAVENOUS | Status: AC
  Administered 2020-05-11: 500 mL via INTRAVENOUS

## 2020-05-11 MED ORDER — HALOPERIDOL LACTATE 5 MG/ML IJ SOLN
5.0000 mg | Freq: Once | INTRAMUSCULAR | Status: AC
  Administered 2020-05-11: 5 mg via INTRAVENOUS
  Filled 2020-05-11: qty 1

## 2020-05-11 NOTE — ED Triage Notes (Addendum)
Pt BIB EMS and coming from home.  Generalized abd pain x 2 hours.  Recently seen for the same. Pt reports pain was better over the weekend but pain is now worse.  Pt has had gall bladder removed. EMS abd is tender to palpation. Pt endorses n/v but no diarrhea to EMS.  EMS started an 18g IV in LAC. EMS gave 4mg  zofran and of fentanyl with no improvement to n/v but slight to pain. EMS started that pt endorses using marijuana yesterday but none today.

## 2020-05-11 NOTE — ED Notes (Signed)
Pt given water 

## 2020-05-11 NOTE — Discharge Instructions (Signed)
You were seen in the ER for nausea, vomiting and abdominal pain  Lab work did not reveal any significant abnormalities  You felt better after IV medicines and fluids.  You able to drink water without vomiting.  The cause of your pain is unclear.  It is potentially a virus or potentially side effect of daily marijuana use.  Cut back on marijuana.  Use Phenergan every 6 hours for the next 24 hours to help with nausea.  Stay hydrated.  Return to the ER for worsening or persistent pain, fever, chest pain, blood in your vomit, changes in your bowel movements

## 2020-05-11 NOTE — ED Notes (Signed)
discharge paperwork reviewed with pt, including prescriptions.  Pt with no questions or concerns at this time, ambulatory at discharge.  Pt provided ice water at discharge, per his request.

## 2020-05-11 NOTE — ED Notes (Signed)
Save blue and gold tube in main lab 

## 2020-05-11 NOTE — ED Provider Notes (Signed)
[facility]-EMERGENC[facility] DEPT Provider Note   CSN: [id number] Arrival date & time: 05/11/20  1240     History Chief Complaint  Patient presents with  . Abdominal Pain    Matthew Scott is a 39 y.o. male with history of gastritis, marijuana use presents to the ER for evaluation of abdominal pain.  When asked how long he has had the symptoms he says "for years".  Clarifies that this time it began this morning when he woke up.  He points to his bellybutton but states it is "all over".  Associated with nausea and vomiting, chills.  Patient states he cannot stop shaking.  He has stomach pills that he takes at home but does not know the name of them one is blue and one is long.  He did not take them today.  He received Zofran and fentanyl on route that has helped his pain but continues to be nauseous.  States usually at home heat pad helps with his pain.  Denies any sick contacts with similar symptoms.  Yesterday he had banana pudding but is not sure if this caused his pain and symptoms.  Denies any fever.  Denies any blood in his vomit.  Denies any diarrhea or constipation.  No urinary symptoms.  Admits to ongoing marijuana use last used yesterday.  Denies alcohol use.  HPI     Past Medical History:  Diagnosis Date  . 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 Chronic abdominal pain   . Cyclical vomiting syndrome   . GASTRITIS 12/09/2009   Qualifier: Diagnosis of  By: Daphine DeutscherMartin FNP, Zena AmosNykedtra    . Gastroparesis   . HELICOBACTER PYLORI INFECTION 12/14/2009   Qualifier: Diagnosis of  By: Levon Hedgerraddock, Brenda    . Nausea and vomiting    chronic, recurrent  . Polysubstance abuse [facility](HCC)     Patient Active Problem List   Diagnosis Date Noted  . 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 Intractable nausea and vomiting 08/04/2018  . Elevated AST (SGOT)  08/04/2018  . Uncontrollable vomiting 03/10/2016  . Anemia 03/10/2016  . Metatarsal fracture 12/22/2015  . Essential hypertension 12/22/2015  . Night sweats 01/21/2015  . GERD (gastroesophageal reflux disease) 01/21/2015  . Gastritis 12/19/2014  . Hyperbilirubinemia 12/19/2014  . Elevated bilirubin   . Nausea & vomiting 12/14/2014  . Severe tetrahydrocannabinol (THC) dependence (HCC) 12/14/2014  . Alcohol abuse 12/14/2014  . Cyclic vomiting syndrome 12/06/2014  . 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 Gastroparesis 05/11/2012  . Abdominal pain 05/06/2012  . Polysubstance abuse (HCC) 05/06/2012  . CHOLECYSTECTOMY, HX OF 11/25/2009    Past Surgical History:  Procedure Laterality Date  . CHOLECYSTECTOMY  11/2009       Family History  Problem Relation Age of Onset  . Other Father   . Diabetes Father   . Hypertension Father     Social History   Tobacco Use  . Smoking status: Current Some Day Smoker    Years: 8.00    Types: Cigars    Last attempt to quit: 12/15/2014    Years since quitting: 5.4  . Smokeless tobacco: Never Used  Vaping Use  . Vaping Use: Never used  Substance Use Topics  . Alcohol use: No    Alcohol/week: 0.0 standard drinks  . Drug use: Yes    Types: Marijuana  Comment: Last used: 4 days ago per patient on 04/30/2018    Home Medications Prior to Admission medications   Medication Sig Start Date End Date Taking? Authorizing Provider  dicyclomine (BENTYL) 20 MG tablet TAKE 1 TABLET BY MOUTH EVERY DAY 02/06/20   Hoy Register, MD  ibuprofen (ADVIL) 600 MG tablet Take 1 tablet (600 mg total) by mouth every 6 (six) hours as needed. 01/29/20   Fayrene Helper, PA-C  ondansetron (ZOFRAN) 4 MG tablet Take 1 tablet (4 mg total) by mouth every 6 (six) hours as needed for nausea. 02/27/20   Anders Simmonds, PA-C  pantoprazole (PROTONIX) 20 MG tablet Take 1 tablet (20 mg total) by mouth daily. 02/27/20 05/27/20  Anders Simmonds, PA-C  promethazine (PHENERGAN) 50 MG tablet Take 1 tablet (50 mg total) by mouth every 6 (six) hours as needed for nausea or vomiting. 05/11/20   Liberty Handy, PA-C  sucralfate (CARAFATE) 1 g tablet Take 1 tablet (1 g total) by mouth in the morning, at noon, and at bedtime. 02/27/20   Anders Simmonds, PA-C    Allergies    Patient has no known allergies.  Review of Systems   Review of Systems  Constitutional: Positive for diaphoresis.  Gastrointestinal: Positive for abdominal pain, nausea and vomiting.  All other systems reviewed and are negative.   Physical Exam Updated Vital Signs BP 111/69   Pulse (!) 56   Temp 97.9 F (36.6 C) (Oral)   Resp 20   SpO2 99%   Physical Exam Vitals and nursing note reviewed.  Constitutional:      Appearance: He is well-developed.     Comments: Patient moving around in bed.  Tells me he feels better if he moves around.  Nontoxic.  HENT:     Head: Normocephalic and atraumatic.     Nose: Nose normal.  Eyes:     Conjunctiva/sclera: Conjunctivae normal.  Cardiovascular:     Rate and Rhythm: Normal rate and regular rhythm.     Heart sounds: Normal heart sounds.  Pulmonary:     Effort: Pulmonary effort is normal.     Breath sounds: Normal breath sounds.  Abdominal:     General: Bowel sounds are normal.     Palpations: Abdomen is soft.     Tenderness: There is generalized abdominal tenderness and tenderness in the periumbilical area.     Comments: Diffuse tenderness worst periumbilical.  No guarding, rigidity.  No distention.  Active bowel sounds to lower quadrants.  No suprapubic or CVA tenderness. Negative Murphy's and McBurney's  Musculoskeletal:        General: Normal range of motion.     Cervical back: Normal range of motion.  Skin:    General: Skin is warm and dry.     Capillary Refill: Capillary refill takes less than 2 seconds.  Neurological:     Mental Status: He is alert.  Psychiatric:        Behavior: Behavior  normal.     ED Results / Procedures / Treatments   Labs (all labs ordered are listed, but only abnormal results are displayed) Labs Reviewed  COMPREHENSIVE METABOLIC PANEL - Abnormal; Notable for the following components:      Result Value   CO2 21 (*)    Glucose, Bld 105 (*)    Total Bilirubin 2.0 (*)    Anion gap 16 (*)    All other components within normal limits  CBC WITH DIFFERENTIAL/PLATELET - Abnormal; Notable for the following  components:   RBC 3.75 (*)    Hemoglobin 12.5 (*)    HCT 37.4 (*)    Neutro Abs 8.1 (*)    Lymphs Abs 0.5 (*)    All other components within normal limits  URINALYSIS, ROUTINE W REFLEX MICROSCOPIC - Abnormal; Notable for the following components:   Hgb urine dipstick SMALL (*)    Ketones, ur 20 (*)    Protein, ur 30 (*)    All other components within normal limits  LIPASE, BLOOD    EKG None  Radiology No results found.  Procedures Procedures (including critical care time)  Medications Ordered in ED Medications  haloperidol lactate (HALDOL) injection 5 mg (5 mg Intravenous Given 05/11/20 1404)  sodium chloride 0.9 % bolus 500 mL (500 mLs Intravenous New Bag/Given 05/11/20 1448)    ED Course  I have reviewed the triage vital signs and the nursing notes.  Pertinent labs & imaging results that were available during my care of the patient were reviewed by me and considered in my medical decision making (see chart for details).  Clinical Course as of May 12 1647  Mon May 11, 2020  1445 Patient reevaluated.  Found asleep.  Looks more comfortable.  States he feels a little bit better.  Has been drinking water without emesis.  Repeat abdominal exam has improved, patient with minimal lingering tenderness periumbilical area.   [CG]  1600 Pulse Rate(!): 102 [CG]  1600 Pulse Rate: 89 [CG]  1600 Anion gap(!): 16 [CG]  1600 Total Bilirubin(!): 2.0 [CG]  1600 Glucose(!): 105 [CG]  1600 CO2(!): 21 [CG]  1600 RBC / HPF: 0-5 [CG]  1600  Hemoglobin(!): 12.5 [CG]  1601 WBC: 8.9 [CG]    Clinical Course User Index [CG] Jerrell Mylar   MDM Rules/Calculators/A&P                          39 year old male presents for acute onset paramedical abdominal pain, nausea, vomiting.  Tells me his symptoms have been going on for "years".  Admits to ongoing marijuana use, last use yesterday.  States at home his symptoms improved with a heating pad.  Patient's EMR, triage nursing notes reviewed to assist with history and MDM.  Seen frequently in the ER for cyclical vomiting.  Carries diagnosis of cannabinoid hyperemesis syndrome.  He has had several CT A/P's, last one in 2020 that was unremarkable.  I have ordered screening abdominal labs including LFTs, creatinine, lipase.  I have ordered a urinalysis.  Cardiac and pulse oximeter monitoring.  Have ordered Haldol, IV fluids.  Patient has been reevaluated twice.  Reports improvement in symptoms.  Found asleep both times.  Repeat abdominal exam has improved.  He is eating and drinking.  ER work-up personally visualized and interpreted, remarkable as above.  No leukocytosis.  Mild anemia, baseline.  Mildly elevated total bilirubin.  Normal LFTs, lipase, creatinine.  Suspect lab findings and urinalysis due to dehydration.  No RBCs in urine or history of kidney stones to suggest GU process.  Differential diagnosis includes viral gastroenteritis, cannabinoid hyperemesis syndrome.  He has no longer abdominal tenderness on reevaluation and other acute intra-abdominal surgical process like appendicitis, cholecystitis, diverticulitis is less likely.  We will discharge patient with Phenergan, oral hydration.  Recommended cessation of marijuana use.  GI follow-up given.  Return precautions given. Final Clinical Impression(s) / ED Diagnoses Final diagnoses:  Recurrent abdominal pain    Rx / DC Orders ED Discharge  Orders         Ordered    promethazine (PHENERGAN) 50 MG tablet  Every 6  hours PRN        05/11/20 1648           Liberty Handy, PA-C 05/11/20 1648    Lorre Nick, MD 05/12/20 [phone]

## 2020-05-12 MED FILL — PROMETHAZINE HCL 25 MG TABS: 25 | 7 days supply | Qty: 60 | Fill #0

## 2020-05-14 ENCOUNTER — Other Ambulatory Visit: Payer: Self-pay | Admitting: *Deleted

## 2020-05-14 NOTE — Patient Outreach (Signed)
[facility]) Care Management  05/14/2020  Matthew Scott 06-08-1981 [id number]   Outreach attempt #2, successful however member state this is not a good time to talk.  Request to call this care manager back.  Per chart, he still hasn't scheduled appointment with GI.  Will send outreach letter as reminder to call this care manager back, will plan to follow up within the next 3-4 business days.  Matthew Scott, [place], MSN [facility] Care Management  [facility] Manager [phone]

## 2020-05-20 ENCOUNTER — Other Ambulatory Visit: Payer: Self-pay | Admitting: *Deleted

## 2020-05-20 NOTE — Patient Outreach (Signed)
[facility]) Care Management  05/20/2020  Matthew Scott May 10, 1981 [id number]   He denies any abdominal pain today, state he was having issues during last outreach attempt where he wasn't able to speak with this care manager.  Discussed ongoing use of marijuana and potential relation to GI issues, inquired about desire to stop.  He again state he feel it helps to alleviate the pain and helps him deal with his temper by "calming" him down.  This care manager inquired about follow up with GI specialist, state he has not scheduled appointment, unsure if he will be able to pay copay.  Report he has been trying to take his Dicyclomine but it makes the pain worse.  He is wanting to switch medication to something different, encouraged to schedule appointment with GI to discuss treatment.  State he is wanting to celebrate his birthday this weekend and will consider making appointment next week.    Addressed cause behind his "temper" and use of marijuana, denies temper being "that bad."  Inquired about depression and/or anxiety and possible involvement with CSW.  He denies, stating this was not necessary.  He does accept contact information for [facility] for substance abuse.  Also provided contact information for GI, encouraged again to call for appointment.  Will follow up within the next month.  Goals Addressed              This Visit's Progress   .  COMPLETED: [facility] - Be stable enough to get a job and work (pt-stated)        CARE PLAN ENTRY (see longitudinal plan of care for additional care plan information)  Current Barriers:  [name] Knowledge Deficits related to management of chronic GI issues (cannabis hyperemesis syndrome . Corporate treasurer.   Nurse Case Manager Clinical Goal(s):  [name] Over the next 28 days, patient will attend all scheduled medical appointments: PCP and GI . Over the next 31 days, patient will experience decrease in ED visits. ED visits in  last 6 months = 7 . Over the next 28 days, the patient will demonstrate ongoing self health care management ability as evidenced by ability to find employment*  Interventions:  . Inter-disciplinary care team collaboration (see longitudinal plan of care) . Advised patient to contact PCP office or this care manager prior to ED visit . Discussed plans with patient for ongoing care management follow up and provided patient with direct contact information for care management team . Reviewed scheduled/upcoming provider appointments including:   Patient Self Care Activities:  . Attends all scheduled provider appointments . Performs ADL's independently . Does not attend all scheduled provider appointments    UPDATE - Resolving due to duplicate goal     .  [facility] - Make and Keep All Appointments        Follow Up Date 11/5   - keep a calendar with appointment dates Encouraged to schedule    Why is this important?   Part of staying healthy is seeing the doctor for follow-up care.  If you forget your appointments, there are some things you can do to stay on track.    Notes:     .  [facility] - Manage My Emotions        Follow Up Date 11/5   - begin personal counseling - join a support group - talk about feelings with a friend, family or spiritual advisor    Why is this important?   When you are stressed,  down or upset, your body reacts too.  For example, your blood pressure may get higher; you may have a headache or stomachache.  When your emotions get the best of you, your body's ability to fight off cold and flu gets weak.  These steps will help you manage your emotions.     Notes:       Kemper Durie, RN, MSN [facility] Care Management  [facility] Manager [phone]

## 2020-06-07 ENCOUNTER — Emergency Department (HOSPITAL_COMMUNITY)
Admission: EM | Admit: 2020-06-07 | Discharge: 2020-06-07 | Disposition: A | Discharge status: Home / Self Care | Payer: 59 | Attending: Emergency Medicine | Admitting: Emergency Medicine

## 2020-06-07 ENCOUNTER — Other Ambulatory Visit: Payer: Self-pay

## 2020-06-07 ENCOUNTER — Encounter (HOSPITAL_COMMUNITY): Payer: Self-pay | Admitting: Emergency Medicine

## 2020-06-07 DIAGNOSIS — M545 Low back pain, unspecified: Secondary | ICD-10-CM

## 2020-06-07 DIAGNOSIS — S39012A Strain of muscle, fascia and tendon of lower back, initial encounter: Secondary | ICD-10-CM | POA: Insufficient documentation

## 2020-06-07 DIAGNOSIS — X500XXA Overexertion from strenuous movement or load, initial encounter: Secondary | ICD-10-CM | POA: Insufficient documentation

## 2020-06-07 DIAGNOSIS — Y93B9 Activity, other involving muscle strengthening exercises: Secondary | ICD-10-CM | POA: Insufficient documentation

## 2020-06-07 DIAGNOSIS — I10 Essential (primary) hypertension: Secondary | ICD-10-CM | POA: Insufficient documentation

## 2020-06-07 DIAGNOSIS — T148XXA Other injury of unspecified body region, initial encounter: Secondary | ICD-10-CM

## 2020-06-07 DIAGNOSIS — F17290 Nicotine dependence, other tobacco product, uncomplicated: Secondary | ICD-10-CM | POA: Diagnosis not present

## 2020-06-07 MED ORDER — METHOCARBAMOL 500 MG PO TABS: 500.0000 mg | ORAL_TABLET | Freq: Two times a day (BID) | ORAL | 0 refills | Status: DC

## 2020-06-07 NOTE — ED Triage Notes (Signed)
C/o lower back pain x 3 days.  Denies known injury.  States he thinks it may be from working out.

## 2020-06-07 NOTE — ED Provider Notes (Signed)
[facility] EMERGENCY DEPARTMENT Provider Note   CSN: [id number] Arrival date & time: 06/07/20  1732     History Chief Complaint  Patient presents with  . Back Pain    Matthew Scott is a 39 y.o. male who presents for evaluation of 3 days of lower back pain.  He states that it started initially 3 days ago after he did a new workout routine that involves doing a lot of push-ups.  He states that since then, he has had pain in his lower back that is worse when he bends, moves.  He states even bending down to tie his shoe makes it hurt.  He took 1 dose of Tylenol but states it did not help.  He denies any trauma, injury, fall.  He has not tried anything else for the pain.  He is still been able to ambulate.  He denies any abdominal pain, dysuria, hematuria. Denies fevers, weight loss, numbness/weakness of upper and lower extremities, bowel/bladder incontinence, saddle anesthesia, history of back surgery, history of IVDA.   The history is provided by the patient.       Past Medical History:  Diagnosis Date  . 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 Chronic abdominal pain   . Cyclical vomiting syndrome   . GASTRITIS 12/09/2009   Qualifier: Diagnosis of  By: Daphine Deutscher FNP, Zena Amos    . Gastroparesis   . 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 Nausea and vomiting    chronic, recurrent  . Polysubstance abuse [facility])     Patient Active Problem List   Diagnosis Date Noted  . 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 Intractable nausea and vomiting 08/04/2018  . Elevated AST (SGOT) 08/04/2018  . Uncontrollable vomiting 03/10/2016  . Anemia 03/10/2016  . Metatarsal fracture 12/22/2015  . Essential hypertension 12/22/2015  . Night sweats 01/21/2015  . GERD (gastroesophageal reflux disease)  01/21/2015  . Gastritis 12/19/2014  . Hyperbilirubinemia 12/19/2014  . Elevated bilirubin   . Nausea & vomiting 12/14/2014  . Severe tetrahydrocannabinol (THC) dependence (HCC) 12/14/2014  . Alcohol abuse 12/14/2014  . Cyclic vomiting syndrome 12/06/2014  . 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 Gastroparesis 05/11/2012  . Abdominal pain 05/06/2012  . Polysubstance abuse (HCC) 05/06/2012  . CHOLECYSTECTOMY, HX OF 11/25/2009    Past Surgical History:  Procedure Laterality Date  . CHOLECYSTECTOMY  11/2009       Family History  Problem Relation Age of Onset  . Other Father   . Diabetes Father   . Hypertension Father     Social History   Tobacco Use  . Smoking status: Current Some Day Smoker    Years: 8.00    Types: Cigars    Last attempt to quit: 12/15/2014    Years since quitting: 5.4  . Smokeless tobacco: Never Used  Vaping Use  . Vaping Use: Never used  Substance Use Topics  . Alcohol use: No    Alcohol/week: 0.0 standard drinks  . Drug use: Yes    Types: Marijuana    Comment: Last used: 4 days ago per patient on 04/30/2018    Home Medications Prior to Admission medications   Medication Sig Start Date End Date Taking? Authorizing Provider  dicyclomine (BENTYL) 20 MG tablet TAKE 1 TABLET BY MOUTH  EVERY DAY 02/06/20   Hoy Register, MD  ibuprofen (ADVIL) 600 MG tablet Take 1 tablet (600 mg total) by mouth every 6 (six) hours as needed. 01/29/20   Fayrene Helper, PA-C  methocarbamol (ROBAXIN) 500 MG tablet Take 1 tablet (500 mg total) by mouth 2 (two) times daily. 06/07/20   Maxwell Caul, PA-C  ondansetron (ZOFRAN) 4 MG tablet Take 1 tablet (4 mg total) by mouth every 6 (six) hours as needed for nausea. 02/27/20   Anders Simmonds, PA-C  pantoprazole (PROTONIX) 20 MG tablet Take 1 tablet (20 mg total) by mouth daily. 02/27/20 05/27/20  Anders Simmonds, PA-C  promethazine (PHENERGAN) 50 MG tablet Take 1 tablet (50 mg total) by  mouth every 6 (six) hours as needed for nausea or vomiting. 05/11/20   Liberty Handy, PA-C  sucralfate (CARAFATE) 1 g tablet Take 1 tablet (1 g total) by mouth in the morning, at noon, and at bedtime. 02/27/20   Anders Simmonds, PA-C    Allergies    Patient has no known allergies.  Review of Systems   Review of Systems  Constitutional: Negative for fever.  Gastrointestinal: Negative for abdominal pain, nausea and vomiting.  Genitourinary: Negative for dysuria and hematuria.  Musculoskeletal: Positive for back pain.  Neurological: Negative for weakness and headaches.  All other systems reviewed and are negative.   Physical Exam Updated Vital Signs BP 137/87   Pulse 84   Temp (!) 97.4 F (36.3 C)   Resp 18   SpO2 97%   Physical Exam Vitals and nursing note reviewed.  Constitutional:      Appearance: He is well-developed.  HENT:     Head: Normocephalic and atraumatic.  Eyes:     General: No scleral icterus.       Right eye: No discharge.        Left eye: No discharge.     Conjunctiva/sclera: Conjunctivae normal.  Pulmonary:     Effort: Pulmonary effort is normal.  Abdominal:     Comments: Abdomen is soft, non-distended, non-tender. No rigidity, No guarding. No peritoneal signs.  Musculoskeletal:       Back:     Comments: Diffuse tenderness noted to the right paraspinal muscles of the lower lumbar region that extends right to the midline.  No deformity or crepitus noted.  No overlying rash.  No tenderness palpation left.  He has overlying muscle spasm noted to the right paraspinal muscles of the lumbar region.  Skin:    General: Skin is warm and dry.  Neurological:     Mental Status: He is alert.     Comments: Follows commands, Moves all extremities  5/5 strength to BUE and BLE  Sensation intact throughout all major nerve distributions  Psychiatric:        Speech: Speech normal.        Behavior: Behavior normal.     ED Results / Procedures / Treatments     Labs (all labs ordered are listed, but only abnormal results are displayed) Labs Reviewed - No data to display  EKG None  Radiology No results found.  Procedures Procedures (including critical care time)  Medications Ordered in ED Medications - No data to display  ED Course  I have reviewed the triage vital signs and the nursing notes.  Pertinent labs & imaging results that were available during my care of the patient were reviewed by me and considered in my medical decision making (see chart for details).  MDM Rules/Calculators/A&P                          39 year old male who presents for evaluation of lower back pain that began 3 days ago after a new workout.  States it hurts more when he bends, moves.  On initial ED arrival, he is afebrile, toxic appearing.  Vital signs are stable.  On exam, he has diffuse tenderness in the lower lumbar region.  He has tenderness palpation noted of the right paraspinal muscles of the lower lumbar region.  No neuro deficits.  No red flag symptoms.  Suspect this is most likely musculoskeletal in nature given his history/physical exam.  He has no history of trauma, fall.  Do not feel that x-rays are warranted.  History/physical exam concerning for cauda equina, spinal abscess.  History/physical exam is not concerning for intra-abdominal process.  I discussed with patient.  I discussed with him that x-rays would shows any bony abnormalities and my suspicion for bone etiology is low given lack of trauma, injury.  He agrees with plan to forego x-rays at this time.  We will plan to treat it with musculoskeletal.  Will send patient home with a short course of Robaxin. At this time, patient exhibits no emergent life-threatening condition that require further evaluation in ED. Patient had ample opportunity for questions and discussion. All patient's questions were answered with full understanding. Strict return precautions discussed. Patient expresses  understanding and agreement to plan.   Portions of this note were generated with Scientist, clinical (histocompatibility and immunogenetics). Dictation errors may occur despite best attempts at proofreading.  Final Clinical Impression(s) / ED Diagnoses Final diagnoses:  Acute right-sided low back pain, unspecified whether sciatica present  Muscle strain    Rx / DC Orders ED Discharge Orders         Ordered    methocarbamol (ROBAXIN) 500 MG tablet  2 times daily        06/07/20 1823           Rosana Hoes 06/07/20 1918    Tegeler, Canary Brim, MD 06/08/20 [phone]

## 2020-06-07 NOTE — Discharge Instructions (Signed)
You can take Tylenol or Ibuprofen as directed for pain. You can alternate Tylenol and Ibuprofen every 4 hours. If you take Tylenol at 1pm, then you can take Ibuprofen at 5pm. Then you can take Tylenol again at 9pm.   Apply icy hot to help with pain.  Take Robaxin as prescribed. This medication will make you drowsy so do not drive or drink alcohol when taking it.  Return to the Emergency Department immediately for any worsening back pain, neck pain, difficulty walking, numbness/weaknss of your arms or legs, urinary or bowel accidents, fever or any other worsening or concerning symptoms.

## 2020-06-12 ENCOUNTER — Encounter (HOSPITAL_BASED_OUTPATIENT_CLINIC_OR_DEPARTMENT_OTHER): Payer: Self-pay | Admitting: Emergency Medicine

## 2020-06-12 ENCOUNTER — Emergency Department (HOSPITAL_BASED_OUTPATIENT_CLINIC_OR_DEPARTMENT_OTHER)
Admission: EM | Admit: 2020-06-12 | Discharge: 2020-06-12 | Disposition: A | Discharge status: Home / Self Care | Payer: 59 | Attending: Emergency Medicine | Admitting: Emergency Medicine

## 2020-06-12 ENCOUNTER — Emergency Department (HOSPITAL_BASED_OUTPATIENT_CLINIC_OR_DEPARTMENT_OTHER): Payer: 59

## 2020-06-12 ENCOUNTER — Other Ambulatory Visit: Payer: Self-pay

## 2020-06-12 DIAGNOSIS — Z8719 Personal history of other diseases of the digestive system: Secondary | ICD-10-CM | POA: Diagnosis not present

## 2020-06-12 DIAGNOSIS — R1031 Right lower quadrant pain: Secondary | ICD-10-CM | POA: Diagnosis not present

## 2020-06-12 DIAGNOSIS — M545 Low back pain, unspecified: Secondary | ICD-10-CM | POA: Diagnosis not present

## 2020-06-12 DIAGNOSIS — F17290 Nicotine dependence, other tobacco product, uncomplicated: Secondary | ICD-10-CM | POA: Insufficient documentation

## 2020-06-12 MED ORDER — CYCLOBENZAPRINE HCL 10 MG PO TABS: 10.0000 mg | ORAL_TABLET | Freq: Two times a day (BID) | ORAL | 0 refills | Status: DC | PRN

## 2020-06-12 MED ORDER — MIDAZOLAM HCL 5 MG/5ML IJ SOLN
4.0000 mg | Freq: Once | INTRAMUSCULAR | Status: AC
  Administered 2020-06-12: 4 mg via INTRAMUSCULAR
  Filled 2020-06-12: qty 5

## 2020-06-12 NOTE — ED Notes (Signed)
Due to medication ordered and given, pt instructed not to get up off stretcher without staff in room, sr x 2 up, call bell within reach, bed in lowest position. Pt verbalized understanding of safety measures in place

## 2020-06-12 NOTE — Discharge Instructions (Addendum)
You were evaluated in the Emergency Department and after careful evaluation, we did not find any emergent condition requiring admission or further testing in the hospital.  Your exam/testing today is overall reassuring.  CT scan did not show any emergencies.  We recommend Tylenol 1000 mg every 4-6 hours, Motrin 600 mg every 4-6 hours.  Would recommend switching your muscle relaxer to the Flexeril prescribed today, which may be more effective for you.  As discussed, back pain can linger for 2 to 4 weeks and so some time and patience is required.  Please return to the Emergency Department if you experience any worsening of your condition.   Thank you for allowing [place] to be a part of your care.

## 2020-06-12 NOTE — ED Notes (Signed)
AVS reviewed with pt of EDP instructions, also informed pt of medication electronically sent to his pharmacy. Discussed not driving or drinking for the next 24 hours due to the medication rec in the ED. Opportunity for questions provided, pt has called a family member to pick him up.

## 2020-06-12 NOTE — ED Notes (Signed)
Arrived by EMS with c/o Rt lower back pain with int shooting pain into rt hip area, states has been seen for back strain and was prescribed Robaxin PO, states took one today but had no relief. EDP informed of pts arrival

## 2020-06-12 NOTE — ED Provider Notes (Signed)
[facility]EM[facility] Emergency Department Provider Note MRN:  [id number]  Arrival date & time: 06/12/20     Chief Complaint   Back Pain   History of Present Illness   Matthew Scott is a 39 y.o. year-old male with a history of gastritis, cyclical vomiting presenting to the ED with chief complaint of back pain.  Pain began 5 days ago shortly after starting a new workout routine with a lot of push-ups.  Pain is located in the right flank, sometimes with radiation to the buttocks, sometimes with radiation to the right lower quadrant.  Pain is constant but worse with certain positions or movements.  Denies hematuria, no issues with bowel movements or urination, no numbness or weakness to the arms or legs.  Has been evaluated at the emergency department few days ago and prescribed Robaxin but not helping.  Review of Systems  A complete 10 system review of systems was obtained and all systems are negative except as noted in the HPI and PMH.   Patient's Health History    Past Medical History:  Diagnosis Date  . 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 Chronic abdominal pain   . Cyclical vomiting syndrome   . GASTRITIS 12/09/2009   Qualifier: Diagnosis of  By: Daphine Deutscher FNP, Zena Amos    . Gastroparesis   . 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 Nausea and vomiting    chronic, recurrent  . Polysubstance abuse [facility])     Past Surgical History:  Procedure Laterality Date  . CHOLECYSTECTOMY  11/2009    Family History  Problem Relation Age of Onset  . Other Father   . Diabetes Father   . Hypertension Father     Social History   Socioeconomic History  . Marital status: Single    Spouse name: Not on file  . Number of children: Not on file  . Years of education: Not on file  . Highest education level: Not on file  Occupational History  . Occupation: Unemployed, helps  people move  Tobacco Use  . Smoking status: Current Some Day Smoker    Years: 8.00    Types: Cigars    Last attempt to quit: 12/15/2014    Years since quitting: 5.4  . Smokeless tobacco: Never Used  Vaping Use  . Vaping Use: Never used  Substance and Sexual Activity  . Alcohol use: No    Alcohol/week: 0.0 standard drinks  . Drug use: Yes    Types: Marijuana    Comment: Last used: 4 days ago per patient on 04/30/2018  . Sexual activity: Yes  Other Topics Concern  . Not on file  Social History Narrative   Single, lives with parents.  Ambulatory.   Social Determinants of Health   Financial Resource Strain:   . Difficulty of Paying Living Expenses: Not on file  Food Insecurity: No Food Insecurity  . Worried About Programme researcher, broadcasting/film/video in the Last Year: Never true  . Ran Out of Food in the Last Year: Never true  Transportation Needs: Unmet Transportation Needs  . Lack of Transportation (Medical): Yes  . Lack of Transportation (Non-Medical): Yes  Physical Activity:   . Days of Exercise per Week: Not on file  . Minutes of Exercise per Session: Not on file  Stress:   . Feeling of Stress : Not on file  Social Connections:   .  Frequency of Communication with Friends and Family: Not on file  . Frequency of Social Gatherings with Friends and Family: Not on file  . Attends Religious Services: Not on file  . Active Member of Clubs or Organizations: Not on file  . Attends Banker Meetings: Not on file  . Marital Status: Not on file  Intimate Partner Violence:   . Fear of Current or Ex-Partner: Not on file  . Emotionally Abused: Not on file  . Physically Abused: Not on file  . Sexually Abused: Not on file     Physical Exam   Vitals:   06/12/20 0857 06/12/20 0944  BP: 112/78 130/73  Pulse: 93 88  Resp: 18 18  Temp:  97.7 F (36.5 C)  SpO2: 98% 97%    CONSTITUTIONAL: Well-appearing, NAD NEURO:  Alert and oriented x 3, no focal deficits EYES:  eyes equal and  reactive ENT/NECK:  no LAD, no JVD CARDIO: Regular rate, well-perfused, normal S1 and S2 PULM:  CTAB no wheezing or rhonchi GI/GU:  normal bowel sounds, non-distended, non-tender MSK/SPINE:  No gross deformities, no edema; tenderness palpation to the right flank musculature SKIN:  no rash, atraumatic PSYCH:  Appropriate speech and behavior  *Additional and/or pertinent findings included in MDM below  Diagnostic and Interventional Summary    EKG Interpretation  Date/Time:    Ventricular Rate:    PR Interval:    QRS Duration:   QT Interval:    QTC Calculation:   R Axis:     Text Interpretation:        Labs Reviewed - No data to display  CT RENAL STONE STUDY    (Results Pending)    Medications  midazolam (VERSED) 5 MG/5ML injection 4 mg (4 mg Intramuscular Given 06/12/20 0858)     Procedures  /  Critical Care Procedures  ED Course and Medical Decision Making  I have reviewed the triage vital signs, the nursing notes, and pertinent available records from the EMR.  Listed above are laboratory and imaging tests that I personally ordered, reviewed, and interpreted and then considered in my medical decision making (see below for details).  Favoring musculoskeletal etiology with nothing to suggest myelopathy, likely muscular spasm.  However pain is radiating to the right groin/right lower quadrant and so also considering kidney stone.  There is no tenderness to the right lower quadrant and so appendicitis is thought to be very unlikely.  Will obtain a screening CT stone protocol study to exclude stone, attempt symptomatic management.       Elmer Sow. Pilar Plate, MD [facility] Emergency Medicine [facility] [email or web address]  Final Clinical Impressions(s) / ED Diagnoses     ICD-10-CM   1. Acute low back pain, unspecified back pain laterality, unspecified whether sciatica present  M54.50     ED Discharge Orders         Ordered    cyclobenzaprine (FLEXERIL)  10 MG tablet  2 times daily PRN        06/12/20 1126           Discharge Instructions Discussed with and Provided to Patient:     Discharge Instructions     You were evaluated in the Emergency Department and after careful evaluation, we did not find any emergent condition requiring admission or further testing in the hospital.  Your exam/testing today is overall reassuring.  CT scan did not show any emergencies.  We recommend Tylenol 1000 mg every 4-6 hours, Motrin 600 mg  every 4-6 hours.  Would recommend switching your muscle relaxer to the Flexeril prescribed today, which may be more effective for you.  As discussed, back pain can linger for 2 to 4 weeks and so some time and patience is required.  Please return to the Emergency Department if you experience any worsening of your condition.   Thank you for allowing [place] to be a part of your care.       Sabas Sous, MD 06/12/20 1128

## 2020-06-12 NOTE — ED Triage Notes (Signed)
Reports right low back pain for over a week.  Seen previously.  Taking muscle relaxer with no relief.  Brought by ems.

## 2020-06-12 NOTE — ED Notes (Signed)
Patient transported to CT via stretcher, sr x 2 up  

## 2020-06-18 ENCOUNTER — Other Ambulatory Visit: Payer: Self-pay | Admitting: *Deleted

## 2020-06-18 NOTE — Patient Outreach (Signed)
[facility]) Care Management  06/18/2020  KASEY EWINGS February 09, 1981 [id number]   Call placed to member to follow up on recent ED visits.  State he wasn't seen this time for GI issues but rather back pain.  Report he has been trying to work out more and strained a muscle in his back.  Was given a muscle relaxer that provided relief for pain.  State he still has been having GI issues but hasn't been bad enough to present to the ED.  This care manager inquired if member has thought more about counseling through [facility] and scheduling visit with GI specialist.  State he has thought about it, will make calls when ready.  Reminded that he has virtual visit scheduled with PCP office tomorrow afternoon.  He denies any urgent concerns, encouraged to contact this care manager with questions.  Will follow up within the next month.  Goals Addressed            This Visit's Progress   . [facility] - Make and Keep All Appointments   On track    Follow Up Date 11/5   - keep a calendar with appointment dates Encouraged to schedule    Why is this important?   Part of staying healthy is seeing the doctor for follow-up care.  If you forget your appointments, there are some things you can do to stay on track.    Notes:   11/4  - reminded of PCP virtual visit tomorrow    . [facility] - Manage My Emotions   On track    Follow Up Date 11/5   - begin personal counseling - join a support group - talk about feelings with a friend, family or spiritual advisor    Why is this important?   When you are stressed, down or upset, your body reacts too.  For example, your blood pressure may get higher; you may have a headache or stomachache.  When your emotions get the best of you, your body's ability to fight off cold and flu gets weak.  These steps will help you manage your emotions.     Notes:   11/4 - encouraged support group again and counseling      Kemper Durie, Charity fundraiser, MSN [facility] Care  Management  [facility] Manager [phone]

## 2020-06-19 ENCOUNTER — Ambulatory Visit: Payer: 59 | Attending: Family | Admitting: Family

## 2020-06-19 ENCOUNTER — Ambulatory Visit: Payer: Self-pay | Admitting: *Deleted

## 2020-06-19 ENCOUNTER — Other Ambulatory Visit: Payer: Self-pay

## 2020-06-19 DIAGNOSIS — M5441 Lumbago with sciatica, right side: Secondary | ICD-10-CM

## 2020-06-19 NOTE — Progress Notes (Signed)
Virtual Visit via Telephone Note  I connected with Matthew Scott, on 06/19/2020 at 3:03 PM by telephone due to the COVID-19 pandemic and verified that I am speaking with the correct person using two identifiers.  Due to current restrictions/limitations of in-office visits due to the COVID-19 pandemic, this scheduled clinical appointment was converted to a telehealth visit.   Consent: I discussed the limitations, risks, security and privacy concerns of performing an evaluation and management service by telephone and the availability of in person appointments. I also discussed with the patient that there may be a patient responsible charge related to this service. The patient expressed understanding and agreed to proceed.   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   1. BACK PAIN: 06/12/2020: Visit at [facility] Emergency Department. After careful evaluation they did not find any emergent condition requiring admission or further testing in the hospital. Exam/testing was overall reassuring. CT scan did not show any emergencies. Recommendation for Tylenol 1000 mg every 4 - 6 hours, Motrin 600 mg every 4 - 6 hours. Recommendation to switch muscle relaxer to Flexeril. Discussed that back pain can linger for 2 to 4 weeks and patience is required. Return to emergency department if condition worsens.  06/19/2020: Location: right lower back  Onset: 1 month Description: 5-10/10 Radiation:  right thigh  Symptoms Worse with: sitting Better with: ice pack, Flexeril helps some Trauma: denies Popping/clicking sounds with movement/bending: denies Muscle spasms: yes Currently not using Ibuprofen and Motrin as prescribed. Says he's not sure if those medications will help.    Past Medical History:  Diagnosis Date  . 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 Chronic abdominal pain   . Cyclical vomiting syndrome   . GASTRITIS 12/09/2009   Qualifier: Diagnosis of  By: Daphine Deutscher FNP, Zena Amos    . Gastroparesis   . 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 Nausea and vomiting    chronic, recurrent  . Polysubstance abuse (HCC)    No Known Allergies  Current Outpatient Medications on File Prior to Visit  Medication Sig Dispense Refill  . cyclobenzaprine (FLEXERIL) 10 MG tablet Take 1 tablet (10 mg total) by mouth 2 (two) times daily as needed for muscle spasms. 20 tablet 0  . dicyclomine (BENTYL) 20 MG tablet TAKE 1 TABLET BY MOUTH EVERY DAY 30 tablet 0  . ibuprofen (ADVIL) 600 MG tablet Take 1 tablet (600 mg total) by mouth every 6 (six) hours as needed. 30 tablet 0  . methocarbamol (ROBAXIN) 500 MG tablet Take 1 tablet (500 mg total) by mouth 2 (two) times daily. 20 tablet 0  . ondansetron (ZOFRAN) 4 MG tablet Take 1 tablet (4 mg total) by mouth every 6 (six) hours as needed for nausea. 30 tablet 1  . pantoprazole (PROTONIX) 20 MG tablet Take 1 tablet (20 mg total) by mouth daily. 30 tablet 2  . promethazine (PHENERGAN) 50 MG tablet Take 1 tablet (50 mg total) by mouth every 6 (six)  hours as needed for nausea or vomiting. 30 tablet 0  . sucralfate (CARAFATE) 1 g tablet Take 1 tablet (1 g total) by mouth in the morning, at noon, and at bedtime. 60 tablet 2   No current facility-administered medications on file prior to visit.    Observations/Objective: Alert and oriented x 3. Not in  acute distress. Physical examination not completed as this is a telemedicine visit.  Assessment and Plan: 1. Acute right-sided low back pain with right-sided sciatica: - Visit at [facility] Emergency Department. After careful evaluation they did not find any emergent condition requiring admission or further testing in the hospital. Exam/testing was overall reassuring. CT scan did not show any emergencies. Recommendation for Tylenol 1000 mg every 4 - 6 hours, Motrin 600 mg every 4 - 6 hours. Recommendation to switch muscle relaxer to Flexeril. Discussed that back pain can linger for 2 to 4 weeks and patience is required. Return to emergency department if condition worsens. - Today reports taking Flexeril and using ice packs both which help some. Says not using Ibuprofen and Motrin because he does not feel it will help much. - Begin taking Ibuprofen and Motrin as prescribed to assist with pain management. - Continue Flexeril as prescribed. May cause drowsiness. Counseled patient to not consume if operating heavy machinery or driving. Counseled patient to not consume with alcohol. - Follow-up with primary physician in 4 to 6 weeks or sooner if needed.  Follow Up Instructions: Follow-up with primary physician in 4 to 6 weeks or sooner if needed.   Patient was given clear instructions to go to Emergency Department or return to medical center if symptoms don't improve, worsen, or new problems develop.The patient verbalized understanding.  I discussed the assessment and treatment plan with the patient. The patient was provided an opportunity to ask questions and all were answered. The patient agreed with the plan and demonstrated an understanding of the instructions.   The patient was advised to call back or seek an in-person evaluation if the symptoms worsen or if the condition fails to improve as anticipated.   I provided 6 minutes total of non-face-to-face time during this encounter  including median intraservice time, reviewing previous notes, labs, imaging, medications, management and patient verbalized understanding.    Rema Fendt, NP  [facility] [place], [place] [phone]   06/19/2020, 3:03 PM

## 2020-06-19 NOTE — Patient Instructions (Signed)
Acute Back Pain, Adult Acute back pain is sudden and usually short-lived. It is often caused by an injury to the muscles and tissues in the back. The injury may result from:  A muscle or ligament getting overstretched or torn (strained). Ligaments are tissues that connect bones to each other. Lifting something improperly can cause a back strain.  Wear and tear (degeneration) of the spinal disks. Spinal disks are circular tissue that provides cushioning between the bones of the spine (vertebrae).  Twisting motions, such as while playing sports or doing yard work.  A hit to the back.  Arthritis. You may have a physical exam, lab tests, and imaging tests to find the cause of your pain. Acute back pain usually goes away with rest and home care. Follow these instructions at home: Managing pain, stiffness, and swelling  Take over-the-counter and prescription medicines only as told by your health care provider.  Your health care provider may recommend applying ice during the first 24-48 hours after your pain starts. To do this: ? Put ice in a plastic bag. ? Place a towel between your skin and the bag. ? Leave the ice on for 20 minutes, 2-3 times a day.  If directed, apply heat to the affected area as often as told by your health care provider. Use the heat source that your health care provider recommends, such as a moist heat pack or a heating pad. ? Place a towel between your skin and the heat source. ? Leave the heat on for 20-30 minutes. ? Remove the heat if your skin turns bright red. This is especially important if you are unable to feel pain, heat, or cold. You have a greater risk of getting burned. Activity   Do not stay in bed. Staying in bed for more than 1-2 days can delay your recovery.  Sit up and stand up straight. Avoid leaning forward when you sit, or hunching over when you stand. ? If you work at a desk, sit close to it so you do not need to lean over. Keep your chin tucked  in. Keep your neck drawn back, and keep your elbows bent at a right angle. Your arms should look like the letter "L." ? Sit high and close to the steering wheel when you drive. Add lower back (lumbar) support to your car seat, if needed.  Take short walks on even surfaces as soon as you are able. Try to increase the length of time you walk each day.  Do not sit, drive, or stand in one place for more than 30 minutes at a time. Sitting or standing for long periods of time can put stress on your back.  Do not drive or use heavy machinery while taking prescription pain medicine.  Use proper lifting techniques. When you bend and lift, use positions that put less stress on your back: ? Bend your knees. ? Keep the load close to your body. ? Avoid twisting.  Exercise regularly as told by your health care provider. Exercising helps your back heal faster and helps prevent back injuries by keeping muscles strong and flexible.  Work with a physical therapist to make a safe exercise program, as recommended by your health care provider. Do any exercises as told by your physical therapist. Lifestyle  Maintain a healthy weight. Extra weight puts stress on your back and makes it difficult to have good posture.  Avoid activities or situations that make you feel anxious or stressed. Stress and anxiety increase muscle   tension and can make back pain worse. Learn ways to manage anxiety and stress, such as through exercise. General instructions  Sleep on a firm mattress in a comfortable position. Try lying on your side with your knees slightly bent. If you lie on your back, put a pillow under your knees.  Follow your treatment plan as told by your health care provider. This may include: ? Cognitive or behavioral therapy. ? Acupuncture or massage therapy. ? Meditation or yoga. Contact a health care provider if:  You have pain that is not relieved with rest or medicine.  You have increasing pain going down  into your legs or buttocks.  Your pain does not improve after 2 weeks.  You have pain at night.  You lose weight without trying.  You have a fever or chills. Get help right away if:  You develop new bowel or bladder control problems.  You have unusual weakness or numbness in your arms or legs.  You develop nausea or vomiting.  You develop abdominal pain.  You feel faint. Summary  Acute back pain is sudden and usually short-lived.  Use proper lifting techniques. When you bend and lift, use positions that put less stress on your back.  Take over-the-counter and prescription medicines and apply heat or ice as directed by your health care provider. This information is not intended to replace advice given to you by your health care provider. Make sure you discuss any questions you have with your health care provider. Document Revised: 11/20/2018 Document Reviewed: 03/15/2017 Elsevier Patient Education  2020 [employer]vier Inc[employer]  

## 2020-06-26 ENCOUNTER — Emergency Department (HOSPITAL_COMMUNITY)
Admission: EM | Admit: 2020-06-26 | Discharge: 2020-06-26 | Disposition: A | Discharge status: Home / Self Care | Payer: 59 | Attending: Emergency Medicine | Admitting: Emergency Medicine

## 2020-06-26 ENCOUNTER — Encounter (HOSPITAL_COMMUNITY): Payer: Self-pay | Admitting: Emergency Medicine

## 2020-06-26 DIAGNOSIS — Z5321 Procedure and treatment not carried out due to patient leaving prior to being seen by health care provider: Secondary | ICD-10-CM | POA: Diagnosis not present

## 2020-06-26 DIAGNOSIS — R1084 Generalized abdominal pain: Secondary | ICD-10-CM | POA: Diagnosis not present

## 2020-06-26 LAB — COMPREHENSIVE METABOLIC PANEL
ALT: 21 U/L (ref 0–44)
AST: 23 U/L (ref 15–41)
Albumin: 4.4 g/dL (ref 3.5–5.0)
Alkaline Phosphatase: 67 U/L (ref 38–126)
Anion gap: 13 (ref 5–15)
BUN: 10 mg/dL (ref 6–20)
CO2: 23 mmol/L (ref 22–32)
Calcium: 10.1 mg/dL (ref 8.9–10.3)
Chloride: 106 mmol/L (ref 98–111)
Creatinine, Ser: 1.08 mg/dL (ref 0.61–1.24)
GFR, Estimated: >60 mL/min (ref >60)
Glucose, Bld: 94 mg/dL (ref 70–99)
Potassium: 3.9 mmol/L (ref 3.5–5.1)
Sodium: 142 mmol/L (ref 135–145)
Total Bilirubin: 1.7 mg/dL — ABNORMAL HIGH (ref 0.3–1.2)
Total Protein: 7.5 g/dL (ref 6.5–8.1)

## 2020-06-26 LAB — URINALYSIS, ROUTINE W REFLEX MICROSCOPIC
Bilirubin Urine: NEGATIVE
Glucose, UA: NEGATIVE mg/dL
Hgb urine dipstick: NEGATIVE
Ketones, ur: NEGATIVE mg/dL
Leukocytes,Ua: NEGATIVE
Nitrite: NEGATIVE
Protein, ur: NEGATIVE mg/dL
Specific Gravity, Urine: 1.03 (ref 1.005–1.030)
pH: 5 (ref 5.0–8.0)

## 2020-06-26 LAB — CBC
HCT: 39.9 % (ref 39.0–52.0)
Hemoglobin: 13.2 g/dL (ref 13.0–17.0)
MCH: 33.2 pg (ref 26.0–34.0)
MCHC: 33.1 g/dL (ref 30.0–36.0)
MCV: 100.3 fL — ABNORMAL HIGH (ref 80.0–100.0)
Platelets: 246 10*3/uL (ref 150–400)
RBC: 3.98 MIL/uL — ABNORMAL LOW (ref 4.22–5.81)
RDW: 11.9 % (ref 11.5–15.5)
WBC: 7.7 10*3/uL (ref 4.0–10.5)
nRBC: 0 % (ref 0.0–0.2)

## 2020-06-26 LAB — LIPASE, BLOOD: Lipase: 23 U/L (ref 11–51)

## 2020-06-26 NOTE — ED Triage Notes (Signed)
Pt arrives via EMS with generalized abd pain. Pt states he threw up his breakfast. Hx of gastritis and cyclic vomiting, reports marijuana use last night.

## 2020-07-20 ENCOUNTER — Other Ambulatory Visit: Payer: Self-pay | Admitting: *Deleted

## 2020-07-20 NOTE — Patient Outreach (Signed)
[facility]) Care Management  07/20/2020  Matthew Scott 04-Sep-1980 [id number]   Outgoing call to member, no answer, unable to leave message as mailbox is full.  Will follow up within the next 3-4 business days.  Kemper Durie, [place], MSN [facility] Care Management  [facility] Manager [phone]

## 2020-07-23 ENCOUNTER — Other Ambulatory Visit: Payer: Self-pay | Admitting: *Deleted

## 2020-07-23 NOTE — Patient Outreach (Signed)
[facility]) Care Management  07/23/2020  Matthew Scott 1981/06/13 [id number]   Outreach attempt #2 to member, state this is not a good time to talk, request to call this care manager back later.  Will await call back, if no call back will follow up within the next 3-4 business days.  Kemper Durie, [place], MSN [facility] Care Management  [facility] Manager [phone]

## 2020-07-26 ENCOUNTER — Other Ambulatory Visit: Payer: Self-pay

## 2020-07-26 ENCOUNTER — Encounter (HOSPITAL_COMMUNITY): Payer: Self-pay | Admitting: *Deleted

## 2020-07-26 ENCOUNTER — Emergency Department (HOSPITAL_COMMUNITY)
Admission: EM | Admit: 2020-07-26 | Discharge: 2020-07-26 | Discharge status: Against Medical Advice | Payer: 59 | Attending: Emergency Medicine | Admitting: Emergency Medicine

## 2020-07-26 DIAGNOSIS — R1011 Right upper quadrant pain: Secondary | ICD-10-CM | POA: Insufficient documentation

## 2020-07-26 DIAGNOSIS — Z5321 Procedure and treatment not carried out due to patient leaving prior to being seen by health care provider: Secondary | ICD-10-CM | POA: Insufficient documentation

## 2020-07-26 LAB — COMPREHENSIVE METABOLIC PANEL
ALT: 16 U/L (ref 0–44)
AST: 23 U/L (ref 15–41)
Albumin: 4 g/dL (ref 3.5–5.0)
Alkaline Phosphatase: 64 U/L (ref 38–126)
Anion gap: 9 (ref 5–15)
BUN: 8 mg/dL (ref 6–20)
CO2: 24 mmol/L (ref 22–32)
Calcium: 9.1 mg/dL (ref 8.9–10.3)
Chloride: 108 mmol/L (ref 98–111)
Creatinine, Ser: 0.74 mg/dL (ref 0.61–1.24)
GFR, Estimated: >60 mL/min (ref >60)
Glucose, Bld: 103 mg/dL — ABNORMAL HIGH (ref 70–99)
Potassium: 3.8 mmol/L (ref 3.5–5.1)
Sodium: 141 mmol/L (ref 135–145)
Total Bilirubin: 1.8 mg/dL — ABNORMAL HIGH (ref 0.3–1.2)
Total Protein: 7 g/dL (ref 6.5–8.1)

## 2020-07-26 LAB — CBC
HCT: 39.2 % (ref 39.0–52.0)
Hemoglobin: 12.8 g/dL — ABNORMAL LOW (ref 13.0–17.0)
MCH: 33.1 pg (ref 26.0–34.0)
MCHC: 32.7 g/dL (ref 30.0–36.0)
MCV: 101.3 fL — ABNORMAL HIGH (ref 80.0–100.0)
Platelets: 311 10*3/uL (ref 150–400)
RBC: 3.87 MIL/uL — ABNORMAL LOW (ref 4.22–5.81)
RDW: 12.4 % (ref 11.5–15.5)
WBC: 8 10*3/uL (ref 4.0–10.5)
nRBC: 0 % (ref 0.0–0.2)

## 2020-07-26 LAB — LIPASE, BLOOD: Lipase: 21 U/L (ref 11–51)

## 2020-07-26 NOTE — ED Notes (Signed)
After pt was taken to room and told to change into gown. A Few minutes later he walked past the desk and out the front door.

## 2020-07-26 NOTE — ED Notes (Signed)
Pt drinking coke in the lobby without emesis.

## 2020-07-26 NOTE — ED Provider Notes (Signed)
Informed that patient left prior to my evaluation   Margarita Grizzle, MD 07/26/20 [phone]

## 2020-07-26 NOTE — ED Notes (Signed)
Patient and belongings not found in room. 

## 2020-07-26 NOTE — ED Notes (Signed)
Patient was asked to change into a hospital gown when he got back to room #1 and he stated that he no longer wanted to be seen my medical staff and was ready to leave and then walked out of ED.

## 2020-07-26 NOTE — ED Triage Notes (Signed)
BIB EMS with RUQ pain, no N/V. 130/90-100%- 56-CBG 101

## 2020-07-27 ENCOUNTER — Encounter (HOSPITAL_COMMUNITY): Payer: Self-pay

## 2020-07-27 ENCOUNTER — Emergency Department (HOSPITAL_COMMUNITY)
Admission: EM | Admit: 2020-07-27 | Discharge: 2020-07-27 | Disposition: A | Discharge status: Home / Self Care | Payer: Self-pay | Attending: Emergency Medicine | Admitting: Emergency Medicine

## 2020-07-27 DIAGNOSIS — R1031 Right lower quadrant pain: Secondary | ICD-10-CM | POA: Insufficient documentation

## 2020-07-27 DIAGNOSIS — R61 Generalized hyperhidrosis: Secondary | ICD-10-CM | POA: Insufficient documentation

## 2020-07-27 DIAGNOSIS — Z5321 Procedure and treatment not carried out due to patient leaving prior to being seen by health care provider: Secondary | ICD-10-CM | POA: Insufficient documentation

## 2020-07-27 LAB — COMPREHENSIVE METABOLIC PANEL
ALT: 16 U/L (ref 0–44)
AST: 21 U/L (ref 15–41)
Albumin: 4 g/dL (ref 3.5–5.0)
Alkaline Phosphatase: 67 U/L (ref 38–126)
Anion gap: 8 (ref 5–15)
BUN: 8 mg/dL (ref 6–20)
CO2: 27 mmol/L (ref 22–32)
Calcium: 9.7 mg/dL (ref 8.9–10.3)
Chloride: 105 mmol/L (ref 98–111)
Creatinine, Ser: 0.91 mg/dL (ref 0.61–1.24)
GFR, Estimated: >60 mL/min (ref >60)
Glucose, Bld: 101 mg/dL — ABNORMAL HIGH (ref 70–99)
Potassium: 3.8 mmol/L (ref 3.5–5.1)
Sodium: 140 mmol/L (ref 135–145)
Total Bilirubin: 1.9 mg/dL — ABNORMAL HIGH (ref 0.3–1.2)
Total Protein: 7.2 g/dL (ref 6.5–8.1)

## 2020-07-27 LAB — LIPASE, BLOOD: Lipase: 19 U/L (ref 11–51)

## 2020-07-27 LAB — CBC
HCT: 39.9 % (ref 39.0–52.0)
Hemoglobin: 12.8 g/dL — ABNORMAL LOW (ref 13.0–17.0)
MCH: 32.3 pg (ref 26.0–34.0)
MCHC: 32.1 g/dL (ref 30.0–36.0)
MCV: 100.8 fL — ABNORMAL HIGH (ref 80.0–100.0)
Platelets: 301 10*3/uL (ref 150–400)
RBC: 3.96 MIL/uL — ABNORMAL LOW (ref 4.22–5.81)
RDW: 12 % (ref 11.5–15.5)
WBC: 10.7 10*3/uL — ABNORMAL HIGH (ref 4.0–10.5)
nRBC: 0 % (ref 0.0–0.2)

## 2020-07-27 NOTE — ED Triage Notes (Signed)
Pt BIB [facility] d/t RUQ abd pain that started approx. 4 days ago. EMS reports pt was doubled over and diaphoretic upon their arrival. Pt endorses a "bubbling" pain rating 1/10 at this time, states the fentanyl given by EMS has helped his pain level to decrease. N/V took place before arrival, denies same at this time.

## 2020-07-27 NOTE — ED Notes (Addendum)
Pt said wait was to long Pt iv was took out Pt told risk of leaving

## 2020-07-28 ENCOUNTER — Encounter (HOSPITAL_COMMUNITY): Payer: Self-pay

## 2020-07-28 ENCOUNTER — Emergency Department (HOSPITAL_COMMUNITY)
Admission: EM | Admit: 2020-07-28 | Discharge: 2020-07-28 | Disposition: A | Discharge status: Home / Self Care | Payer: Self-pay | Attending: Emergency Medicine | Admitting: Emergency Medicine

## 2020-07-28 DIAGNOSIS — R1010 Upper abdominal pain, unspecified: Secondary | ICD-10-CM

## 2020-07-28 DIAGNOSIS — R109 Unspecified abdominal pain: Secondary | ICD-10-CM | POA: Insufficient documentation

## 2020-07-28 DIAGNOSIS — Z5321 Procedure and treatment not carried out due to patient leaving prior to being seen by health care provider: Secondary | ICD-10-CM | POA: Insufficient documentation

## 2020-07-28 LAB — CBC
HCT: 37.8 % — ABNORMAL LOW (ref 39.0–52.0)
Hemoglobin: 12.4 g/dL — ABNORMAL LOW (ref 13.0–17.0)
MCH: 32.5 pg (ref 26.0–34.0)
MCHC: 32.8 g/dL (ref 30.0–36.0)
MCV: 99.2 fL (ref 80.0–100.0)
Platelets: 269 10*3/uL (ref 150–400)
RBC: 3.81 MIL/uL — ABNORMAL LOW (ref 4.22–5.81)
RDW: 12.2 % (ref 11.5–15.5)
WBC: 6.6 10*3/uL (ref 4.0–10.5)
nRBC: 0 % (ref 0.0–0.2)

## 2020-07-28 LAB — COMPREHENSIVE METABOLIC PANEL
ALT: 16 U/L (ref 0–44)
AST: 23 U/L (ref 15–41)
Albumin: 4 g/dL (ref 3.5–5.0)
Alkaline Phosphatase: 69 U/L (ref 38–126)
Anion gap: 8 (ref 5–15)
BUN: 10 mg/dL (ref 6–20)
CO2: 29 mmol/L (ref 22–32)
Calcium: 9.4 mg/dL (ref 8.9–10.3)
Chloride: 103 mmol/L (ref 98–111)
Creatinine, Ser: 0.62 mg/dL (ref 0.61–1.24)
GFR, Estimated: >60 mL/min (ref >60)
Glucose, Bld: 106 mg/dL — ABNORMAL HIGH (ref 70–99)
Potassium: 3.7 mmol/L (ref 3.5–5.1)
Sodium: 140 mmol/L (ref 135–145)
Total Bilirubin: 1.8 mg/dL — ABNORMAL HIGH (ref 0.3–1.2)
Total Protein: 7.1 g/dL (ref 6.5–8.1)

## 2020-07-28 LAB — LIPASE, BLOOD: Lipase: 19 U/L (ref 11–51)

## 2020-07-28 NOTE — ED Triage Notes (Signed)
Pt presents with c/o abdominal pain. Pt has been seen several times over the last few days for the same. Pt reports that he was told his pain is related to marijuana use. Pt denies any use today but does report use yesterday. Pt is alert and oriented x 4.

## 2020-07-29 ENCOUNTER — Other Ambulatory Visit: Payer: Self-pay | Admitting: *Deleted

## 2020-07-29 NOTE — Patient Outreach (Signed)
[facility]) Care Management  07/29/2020  Matthew Scott [birth year] [id number]   Outreach attempt #3, successful.  Member report he has still been having the chronic abdominal pain.  Since last outreach on 11/4, he has presented to the ED 4 time, 3 of which has been in the last 3 days.  Each of those time he has left without being seen.  This care manager inquired about this, he state he was in the waiting room too ling and didn't want to wait any longer.  He had televisit with PCP office on 11/5, was supposed to follow up within 4-6 weeks.  This appointment still has not been scheduled.  State he will call office.  This care manager inquired about member's decision to follow up with GI, report he still does not want to pay the copay.  Advised that if unable to have specialist assess chronic condition there is increased risk of recurrent ED visits and uncontrolled pain.  He verbalizes understanding and state he will think about it.  State he will also think about contacting [facility] for counseling.  Denies any urgent concerns, encouraged to contact this care manager with questions.  Agrees to follow up within the next month.  Goals Addressed            This Visit's Progress   . [facility] - Make and Keep All Appointments   Not on track    Follow Up Date 08/29/2020  Timeframe:  Short-Term Goal Priority:  Medium Start Date:       07/29/2020                      Expected End Date:    08/29/2020                     - keep a calendar with appointment dates Encouraged to schedule    Why is this important?   Part of staying healthy is seeing the doctor for follow-up care.  If you forget your appointments, there are some things you can do to stay on track.    Notes:   11/4  - reminded of PCP virtual visit tomorrow  12/15 - encouraged to call PCP to schedule follow up visit per last office visit AVS    . [facility] - Manage My Emotions   Not on track    Follow Up Date  08/29/2020  Timeframe:  Long-Range Goal Priority:  High Start Date:       07/29/2020                      Expected End Date:   09/29/2020                      - begin personal counseling - join a support group - talk about feelings with a friend, family or spiritual advisor    Why is this important?   When you are stressed, down or upset, your body reacts too.  For example, your blood pressure may get higher; you may have a headache or stomachache.  When your emotions get the best of you, your body's ability to fight off cold and flu gets weak.  These steps will help you manage your emotions.     Notes:   11/4 - encouraged support group again and counseling  12/15 - Re-educated on importance of stress management and counseling, encouraged to consider  Kemper Durie, [place], MSN [facility] Care Management  [facility] Manager [phone]

## 2020-07-30 ENCOUNTER — Emergency Department (HOSPITAL_COMMUNITY)
Admission: EM | Admit: 2020-07-30 | Discharge: 2020-07-30 | Disposition: A | Discharge status: Home / Self Care | Payer: Self-pay | Attending: Emergency Medicine | Admitting: Emergency Medicine

## 2020-07-30 ENCOUNTER — Encounter (HOSPITAL_COMMUNITY): Payer: Self-pay | Admitting: Emergency Medicine

## 2020-07-30 DIAGNOSIS — Z5321 Procedure and treatment not carried out due to patient leaving prior to being seen by health care provider: Secondary | ICD-10-CM | POA: Insufficient documentation

## 2020-07-30 DIAGNOSIS — R1011 Right upper quadrant pain: Secondary | ICD-10-CM | POA: Insufficient documentation

## 2020-07-30 NOTE — ED Notes (Signed)
Called pt name x4 for VS recheck. No response from pt.  

## 2020-07-30 NOTE — ED Triage Notes (Signed)
Pt arrives via [facility] from home with c/o RUQ pain, reports RUQ pain since having cholecystectomy. Pt seen here on 12/14 for the same. [facility] VSS.

## 2020-08-11 ENCOUNTER — Emergency Department (HOSPITAL_COMMUNITY)
Admission: EM | Admit: 2020-08-11 | Discharge: 2020-08-11 | Disposition: A | Discharge status: Home / Self Care | Payer: Self-pay | Attending: Emergency Medicine | Admitting: Emergency Medicine

## 2020-08-11 ENCOUNTER — Encounter (HOSPITAL_COMMUNITY): Payer: Self-pay

## 2020-08-11 ENCOUNTER — Other Ambulatory Visit: Payer: Self-pay

## 2020-08-11 DIAGNOSIS — Z5321 Procedure and treatment not carried out due to patient leaving prior to being seen by health care provider: Secondary | ICD-10-CM | POA: Insufficient documentation

## 2020-08-11 DIAGNOSIS — R109 Unspecified abdominal pain: Secondary | ICD-10-CM | POA: Insufficient documentation

## 2020-08-11 LAB — LIPASE, BLOOD: Lipase: 22 U/L (ref 11–51)

## 2020-08-11 LAB — CBC
HCT: 37.3 % — ABNORMAL LOW (ref 39.0–52.0)
Hemoglobin: 12.4 g/dL — ABNORMAL LOW (ref 13.0–17.0)
MCH: 33.7 pg (ref 26.0–34.0)
MCHC: 33.2 g/dL (ref 30.0–36.0)
MCV: 101.4 fL — ABNORMAL HIGH (ref 80.0–100.0)
Platelets: 233 10*3/uL (ref 150–400)
RBC: 3.68 MIL/uL — ABNORMAL LOW (ref 4.22–5.81)
RDW: 12.7 % (ref 11.5–15.5)
WBC: 8.4 10*3/uL (ref 4.0–10.5)
nRBC: 0 % (ref 0.0–0.2)

## 2020-08-11 LAB — COMPREHENSIVE METABOLIC PANEL
ALT: 18 U/L (ref 0–44)
AST: 21 U/L (ref 15–41)
Albumin: 4.2 g/dL (ref 3.5–5.0)
Alkaline Phosphatase: 64 U/L (ref 38–126)
Anion gap: 8 (ref 5–15)
BUN: 11 mg/dL (ref 6–20)
CO2: 25 mmol/L (ref 22–32)
Calcium: 9.2 mg/dL (ref 8.9–10.3)
Chloride: 108 mmol/L (ref 98–111)
Creatinine, Ser: 0.87 mg/dL (ref 0.61–1.24)
GFR, Estimated: >60 mL/min (ref >60)
Glucose, Bld: 98 mg/dL (ref 70–99)
Potassium: 3.9 mmol/L (ref 3.5–5.1)
Sodium: 141 mmol/L (ref 135–145)
Total Bilirubin: 1.4 mg/dL — ABNORMAL HIGH (ref 0.3–1.2)
Total Protein: 7.2 g/dL (ref 6.5–8.1)

## 2020-08-11 NOTE — ED Triage Notes (Signed)
Pt presents with c/o abdominal pain that started this morning. Hx of same pain in the past.

## 2020-08-11 NOTE — ED Notes (Signed)
Pt called 3x. Eloped from waiting area.  

## 2020-08-26 ENCOUNTER — Emergency Department (HOSPITAL_COMMUNITY)
Admission: EM | Admit: 2020-08-26 | Discharge: 2020-08-26 | Disposition: A | Discharge status: Home / Self Care | Payer: Self-pay | Attending: Emergency Medicine | Admitting: Emergency Medicine

## 2020-08-26 ENCOUNTER — Encounter (HOSPITAL_COMMUNITY): Payer: Self-pay

## 2020-08-26 ENCOUNTER — Other Ambulatory Visit: Payer: Self-pay | Admitting: *Deleted

## 2020-08-26 DIAGNOSIS — I10 Essential (primary) hypertension: Secondary | ICD-10-CM | POA: Insufficient documentation

## 2020-08-26 DIAGNOSIS — F17290 Nicotine dependence, other tobacco product, uncomplicated: Secondary | ICD-10-CM | POA: Insufficient documentation

## 2020-08-26 DIAGNOSIS — F12188 Cannabis abuse with other cannabis-induced disorder: Secondary | ICD-10-CM | POA: Insufficient documentation

## 2020-08-26 DIAGNOSIS — R112 Nausea with vomiting, unspecified: Secondary | ICD-10-CM

## 2020-08-26 DIAGNOSIS — K029 Dental caries, unspecified: Secondary | ICD-10-CM | POA: Insufficient documentation

## 2020-08-26 DIAGNOSIS — K219 Gastro-esophageal reflux disease without esophagitis: Secondary | ICD-10-CM | POA: Insufficient documentation

## 2020-08-26 DIAGNOSIS — F1290 Cannabis use, unspecified, uncomplicated: Secondary | ICD-10-CM

## 2020-08-26 LAB — URINALYSIS, ROUTINE W REFLEX MICROSCOPIC
Bacteria, UA: NONE SEEN
Bilirubin Urine: NEGATIVE
Glucose, UA: NEGATIVE mg/dL
Ketones, ur: NEGATIVE mg/dL
Leukocytes,Ua: NEGATIVE
Nitrite: NEGATIVE
Protein, ur: 100 mg/dL — AB
Specific Gravity, Urine: 1.021 (ref 1.005–1.030)
pH: 5 (ref 5.0–8.0)

## 2020-08-26 LAB — CBC
HCT: 39.2 % (ref 39.0–52.0)
Hemoglobin: 12.7 g/dL — ABNORMAL LOW (ref 13.0–17.0)
MCH: 32.8 pg (ref 26.0–34.0)
MCHC: 32.4 g/dL (ref 30.0–36.0)
MCV: 101.3 fL — ABNORMAL HIGH (ref 80.0–100.0)
Platelets: 226 10*3/uL (ref 150–400)
RBC: 3.87 MIL/uL — ABNORMAL LOW (ref 4.22–5.81)
RDW: 12.4 % (ref 11.5–15.5)
WBC: 11.1 10*3/uL — ABNORMAL HIGH (ref 4.0–10.5)
nRBC: 0 % (ref 0.0–0.2)

## 2020-08-26 LAB — COMPREHENSIVE METABOLIC PANEL
ALT: 19 U/L (ref 0–44)
AST: 26 U/L (ref 15–41)
Albumin: 4.4 g/dL (ref 3.5–5.0)
Alkaline Phosphatase: 73 U/L (ref 38–126)
Anion gap: 14 (ref 5–15)
BUN: 11 mg/dL (ref 6–20)
CO2: 24 mmol/L (ref 22–32)
Calcium: 9.8 mg/dL (ref 8.9–10.3)
Chloride: 103 mmol/L (ref 98–111)
Creatinine, Ser: 1.15 mg/dL (ref 0.61–1.24)
GFR, Estimated: >60 mL/min (ref >60)
Glucose, Bld: 115 mg/dL — ABNORMAL HIGH (ref 70–99)
Potassium: 4.4 mmol/L (ref 3.5–5.1)
Sodium: 141 mmol/L (ref 135–145)
Total Bilirubin: 1.5 mg/dL — ABNORMAL HIGH (ref 0.3–1.2)
Total Protein: 7.7 g/dL (ref 6.5–8.1)

## 2020-08-26 LAB — LIPASE, BLOOD: Lipase: 17 U/L (ref 11–51)

## 2020-08-26 MED ORDER — SODIUM CHLORIDE 0.9 % IV BOLUS
1000.0000 mL | Freq: Once | INTRAVENOUS | Status: AC
  Administered 2020-08-26: 1000 mL via INTRAVENOUS

## 2020-08-26 MED ORDER — KETOROLAC TROMETHAMINE 30 MG/ML IJ SOLN
30.0000 mg | Freq: Once | INTRAMUSCULAR | Status: AC
  Administered 2020-08-26: 30 mg via INTRAVENOUS
  Filled 2020-08-26: qty 1

## 2020-08-26 MED ORDER — ONDANSETRON HCL 4 MG/2ML IJ SOLN
4.0000 mg | Freq: Once | INTRAMUSCULAR | Status: AC
  Administered 2020-08-26: 4 mg via INTRAVENOUS
  Filled 2020-08-26: qty 2

## 2020-08-26 MED ORDER — LORAZEPAM 2 MG/ML IJ SOLN
1.0000 mg | Freq: Once | INTRAMUSCULAR | Status: AC
  Administered 2020-08-26: 1 mg via INTRAVENOUS
  Filled 2020-08-26: qty 1

## 2020-08-26 MED ORDER — CAPSAICIN 0.075 % EX CREA
TOPICAL_CREAM | Freq: Two times a day (BID) | CUTANEOUS | Status: DC
  Filled 2020-08-26: qty 60

## 2020-08-26 MED ORDER — HALOPERIDOL LACTATE 5 MG/ML IJ SOLN
5.0000 mg | Freq: Once | INTRAMUSCULAR | Status: AC
  Administered 2020-08-26: 5 mg via INTRAVENOUS
  Filled 2020-08-26: qty 1

## 2020-08-26 NOTE — ED Triage Notes (Signed)
Pt comes via [facility] for dental pain, L bottom side for 3 days, pt took ibuprofen without relief, pt also c/o of his chronic abd pain from cyclical vomiting syndrome after smoking mariajuana tonight.

## 2020-08-26 NOTE — ED Provider Notes (Signed)
[facility] EMERGENCY DEPARTMENT Provider Note   CSN: [id number] Arrival date & time: 08/26/20  0356     History Chief Complaint  Patient presents with  . Dental Pain  . Abdominal Pain    Matthew Scott is a 40 y.o. male.  HPI Who presents for evaluation of abdominal pain with nausea and vomiting which started early this morning.  He also has had right lower dental pain for several days from teeth which have cavities in them.  He has a history of hyperemesis cannabinoid syndrome, and smoked marijuana last night.  He denies blood in emesis, fever, chills, cough, shortness of breath, weakness or dizziness.  There are no other known modifying factors.    Past Medical History:  Diagnosis Date  . 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 Chronic abdominal pain   . Cyclical vomiting syndrome   . GASTRITIS 12/09/2009   Qualifier: Diagnosis of  By: Daphine Deutscher FNP, Zena Amos    . Gastroparesis   . 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 Nausea and vomiting    chronic, recurrent  . Polysubstance abuse [facility])     Patient Active Problem List   Diagnosis Date Noted  . 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 Intractable nausea and vomiting 08/04/2018  . Elevated AST (SGOT) 08/04/2018  . Uncontrollable vomiting 03/10/2016  . Anemia 03/10/2016  . Metatarsal fracture 12/22/2015  . Essential hypertension 12/22/2015  . Night sweats 01/21/2015  . GERD (gastroesophageal reflux disease) 01/21/2015  . Gastritis 12/19/2014  . Hyperbilirubinemia 12/19/2014  . Elevated bilirubin   . Nausea & vomiting 12/14/2014  . Severe tetrahydrocannabinol (THC) dependence (HCC) 12/14/2014  . Alcohol abuse 12/14/2014  . Cyclic vomiting syndrome 12/06/2014  . Intractable vomiting secondary to cannabis hyperemesis  syndrome 12/05/2014  . Gastroparesis 05/11/2012  . Abdominal pain 05/06/2012  . Polysubstance abuse (HCC) 05/06/2012  . CHOLECYSTECTOMY, HX OF 11/25/2009    Past Surgical History:  Procedure Laterality Date  . CHOLECYSTECTOMY  11/2009       Family History  Problem Relation Age of Onset  . Other Father   . Diabetes Father   . Hypertension Father     Social History   Tobacco Use  . Smoking status: Current Some Day Smoker    Years: 8.00    Types: Cigars    Last attempt to quit: 12/15/2014    Years since quitting: 5.7  . Smokeless tobacco: Never Used  Vaping Use  . Vaping Use: Never used  Substance Use Topics  . Alcohol use: No    Alcohol/week: 0.0 standard drinks  . Drug use: Yes    Types: Marijuana    Comment: Last used: 4 days ago per patient on 04/30/2018    Home Medications Prior to Admission medications   Medication Sig Start Date End Date Taking? Authorizing Provider  cyclobenzaprine (FLEXERIL) 10 MG tablet Take 1 tablet (10 mg total) by mouth 2 (two) times daily as needed for muscle spasms. 06/12/20   Sabas Sous, MD  dicyclomine (BENTYL) 20 MG tablet TAKE 1 TABLET BY MOUTH EVERY DAY 02/06/20   Hoy Register, MD  ibuprofen (ADVIL) 600 MG tablet Take 1 tablet (600 mg total) by mouth every 6 (six) hours as needed. 01/29/20   Fayrene Helper, PA-C  methocarbamol (ROBAXIN) 500 MG tablet  Take 1 tablet (500 mg total) by mouth 2 (two) times daily. 06/07/20   Maxwell Caul, PA-C  ondansetron (ZOFRAN) 4 MG tablet Take 1 tablet (4 mg total) by mouth every 6 (six) hours as needed for nausea. 02/27/20   Anders Simmonds, PA-C  pantoprazole (PROTONIX) 20 MG tablet Take 1 tablet (20 mg total) by mouth daily. 02/27/20 05/27/20  Anders Simmonds, PA-C  promethazine (PHENERGAN) 50 MG tablet Take 1 tablet (50 mg total) by mouth every 6 (six) hours as needed for nausea or vomiting. 05/11/20   Liberty Handy, PA-C  sucralfate (CARAFATE) 1 g tablet Take 1 tablet (1 g total) by  mouth in the morning, at noon, and at bedtime. 02/27/20   Anders Simmonds, PA-C    Allergies    Patient has no known allergies.  Review of Systems   Review of Systems  All other systems reviewed and are negative.   Physical Exam Updated Vital Signs BP 109/66 (BP Location: Right Arm)   Pulse 61   Temp 98.7 F (37.1 C) (Oral)   Resp 16   SpO2 99%   Physical Exam Vitals and nursing note reviewed.  Constitutional:      General: He is in acute distress.     Appearance: He is well-developed and well-nourished. He is ill-appearing. He is not toxic-appearing or diaphoretic.     Comments: Grimacing, writhing while sitting in the stretcher, pacing and vomiting.  HENT:     Head: Normocephalic and atraumatic.     Right Ear: External ear normal.     Left Ear: External ear normal.     Mouth/Throat:     Comments: Several teeth with large caries, left lower gumline, no associated swelling, or trismus. Eyes:     Extraocular Movements: EOM normal.     Conjunctiva/sclera: Conjunctivae normal.     Pupils: Pupils are equal, round, and reactive to light.  Neck:     Trachea: Phonation normal.  Cardiovascular:     Rate and Rhythm: Normal rate and regular rhythm.  Pulmonary:     Effort: Pulmonary effort is normal. No respiratory distress.     Breath sounds: No stridor.  Chest:     Chest wall: No bony tenderness.  Abdominal:     General: There is no distension.     Palpations: Abdomen is soft.  Musculoskeletal:        General: Normal range of motion.     Cervical back: Normal range of motion and neck supple.     Right lower leg: No edema.     Left lower leg: No edema.  Skin:    General: Skin is dry and intact.  Neurological:     Mental Status: He is alert and oriented to person, place, and time.     Cranial Nerves: No cranial nerve deficit.     Sensory: No sensory deficit.     Motor: No abnormal muscle tone.     Coordination: Coordination normal.  Psychiatric:        Mood and  Affect: Mood is anxious.        Behavior: Behavior normal.        Thought Content: Thought content normal.        Judgment: Judgment normal.     ED Results / Procedures / Treatments   Labs (all labs ordered are listed, but only abnormal results are displayed) Labs Reviewed  COMPREHENSIVE METABOLIC PANEL - Abnormal; Notable for the following components:  Result Value   Glucose, Bld 115 (*)    Total Bilirubin 1.5 (*)    All other components within normal limits  CBC - Abnormal; Notable for the following components:   WBC 11.1 (*)    RBC 3.87 (*)    Hemoglobin 12.7 (*)    MCV 101.3 (*)    All other components within normal limits  URINALYSIS, ROUTINE W REFLEX MICROSCOPIC - Abnormal; Notable for the following components:   Hgb urine dipstick SMALL (*)    Protein, ur 100 (*)    All other components within normal limits  LIPASE, BLOOD    EKG None  Radiology No results found.  Procedures Procedures (including critical care time)  Medications Ordered in ED Medications  capsicum (ZOSTRIX) 0.075 % cream ( Topical Given 08/26/20 1019)  sodium chloride 0.9 % bolus 1,000 mL (0 mLs Intravenous Stopped 08/26/20 1044)  ondansetron (ZOFRAN) injection 4 mg (4 mg Intravenous Given 08/26/20 0905)  haloperidol lactate (HALDOL) injection 5 mg (5 mg Intravenous Given 08/26/20 0905)  LORazepam (ATIVAN) injection 1 mg (1 mg Intravenous Given 08/26/20 0905)  ketorolac (TORADOL) 30 MG/ML injection 30 mg (30 mg Intravenous Given 08/26/20 [id number])    ED Course  I have reviewed the triage vital signs and the nursing notes.  Pertinent labs & imaging results that were available during my care of the patient were reviewed by me and considered in my medical decision making (see chart for details).    MDM Rules/Calculators/A&P                           Patient Vitals for the past 24 hrs:  BP Temp Temp src Pulse Resp SpO2  08/26/20 1044 109/66 - - 61 16 99 %  08/26/20 0700 123/87 98.7 F (37.1  C) Oral 94 (!) 22 100 %  08/26/20 0406 (!) 154/96 97.9 F (36.6 C) Oral 66 20 94 %    1:25 PM Reevaluation with update and discussion. After initial assessment and treatment, an updated evaluation reveals he is now arousable after several hours of sleeping deeply.  He looks and states that he is comfortable.  Findings discussed and questions answered. Mancel BaleElliott Vallery Mcdade   Medical Decision Making:  This patient is presenting for evaluation of dental pain, vomiting and abdominal pain, which does require a range of treatment options, and is a complaint that involves a moderate risk of morbidity and mortality. The differential diagnoses include hyperemesis cannabinoid syndrome, dental abscess, nonspecific vomiting. I decided to review old records, and in summary middle-aged male presenting with vomiting as primary problem with secondary abdominal pain and ongoing dental pain.  I did not require additional historical information from anyone.  Clinical Laboratory Tests Ordered, included CBC, Metabolic panel and Urinalysis. Review indicates normal except mild elevation of white count and MCV.  Hemoglobin is slightly low..   Critical Interventions-clinical evaluation, laboratory testing, medication treatment, observation and reassessment  After These Interventions, the Patient was reevaluated and was found symptoms resolved, vital signs normalized, patient feels more comfortable.  Evaluation is consistent with hyperemesis cannabinoid syndrome.  He has ongoing dental caries which do not seem to be causing a problem at this time.  He is instructed to get dental care for treatment of the caries.  CRITICAL CARE-no Performed by: Mancel BaleElliott Daveena Elmore  Nursing Notes Reviewed/ Care Coordinated Applicable Imaging Reviewed Interpretation of Laboratory Data incorporated into ED treatment  The patient appears reasonably screened and/or stabilized for discharge and  I doubt any other medical condition or other [facility]  requiring further screening, evaluation, or treatment in the ED at this time prior to discharge.  Plan: Home Medications-symptomatic treatment; Home Treatments-avoid marijuana; return here if the recommended treatment, does not improve the symptoms; Recommended follow up-PCP, as needed.  Follow-up with dentistry for further care of dental problems.     Final Clinical Impression(s) / ED Diagnoses Final diagnoses:  Cannabinoid hyperemesis syndrome  Pain due to dental caries    Rx / DC Orders ED Discharge Orders    None       Mancel Bale, MD 08/27/20 1157

## 2020-08-26 NOTE — Discharge Instructions (Signed)
Follow-up for dental care with one of the facilities listed on the attached paperwork.  Avoid using marijuana as it will worsen your vomiting and abdominal pain.  You can use the capsaicin cream twice a day by applying it to your abdomen an area about 12 inches around your bellybutton.  This can help your abdominal pain, nausea and vomiting.

## 2020-08-26 NOTE — Patient Outreach (Signed)
[facility]) Care Management  08/26/2020  Matthew Scott 1980/08/19 [id number]   Member was scheduled for outreach today, noted that he is currently in the ED.  Will follow up within the next week pending disposition.  Kemper Durie, [place], MSN [facility] Care Management  [facility] Manager [phone]

## 2020-08-31 ENCOUNTER — Other Ambulatory Visit: Payer: Self-pay | Admitting: *Deleted
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
## 2020-09-04 ENCOUNTER — Other Ambulatory Visit: Payer: Self-pay | Admitting: *Deleted

## 2020-09-04 NOTE — Patient Outreach (Signed)
[facility]) Care Management  09/04/2020  AARIC DOLPH 1980/10/08 [id number]   Outreach attempt #2, unsuccessful, mailbox full.  Will send outreach letter and follow up within the next 3-4 business days.  Kemper Durie, [place], MSN [facility] Care Management  [facility] Manager [phone]

## 2020-09-07 ENCOUNTER — Emergency Department (HOSPITAL_COMMUNITY): Admission: EM | Admit: 2020-09-07 | Discharge: 2020-09-07 | Discharge status: Against Medical Advice | Payer: 59

## 2020-09-07 NOTE — ED Notes (Signed)
Called multiple times in lobby with no answer

## 2020-09-07 NOTE — ED Notes (Signed)
No answer in lobby x 2.

## 2020-09-11 ENCOUNTER — Other Ambulatory Visit: Payer: Self-pay | Admitting: *Deleted

## 2020-09-11 NOTE — Patient Outreach (Signed)
[facility]) Care Management  [facility] Care Manager  09/12/2020   Matthew Scott [birth year] [id number]   Outreach attempt #3, successful.  He has still had multiple ED visits, many where he leaves without being seen.  Discussed this with member, state he will continue to go to ED if he is feeling bad but he refuses to stay and way for a long period of time.  Educated on use of PCP office and recommended GI specialist in effort to manage chronic conditions and decrease use of ED.  State he will call PCP office to schedule visit, continue to state he does not have the money for specialist.  Advised to schedule visit for within the next couple months and slowly save money to pay for services.  He remains reluctant but state he will think about it.  He still has not called PCP office or pharmacy to have medications replaced, state he does not feel he need them since he has not been sick since losing medications.  Encouraged to get refills, verbalizes understanding.  Denies any urgent concerns, encouraged to contact this care manager with questions.    Encounter Medications:  Outpatient Encounter Medications as of 09/11/2020  Medication Sig  . cyclobenzaprine (FLEXERIL) 10 MG tablet Take 1 tablet (10 mg total) by mouth 2 (two) times daily as needed for muscle spasms.  [name] dicyclomine (BENTYL) 20 MG tablet TAKE 1 TABLET BY MOUTH EVERY DAY  . ibuprofen (ADVIL) 600 MG tablet Take 1 tablet (600 mg total) by mouth every 6 (six) hours as needed.  . methocarbamol (ROBAXIN) 500 MG tablet Take 1 tablet (500 mg total) by mouth 2 (two) times daily.  . ondansetron (ZOFRAN) 4 MG tablet Take 1 tablet (4 mg total) by mouth every 6 (six) hours as needed for nausea.  . pantoprazole (PROTONIX) 20 MG tablet Take 1 tablet (20 mg total) by mouth daily.  . promethazine (PHENERGAN) 50 MG tablet Take 1 tablet (50 mg total) by mouth every 6 (six) hours as needed for nausea or vomiting.  . sucralfate (CARAFATE) 1 g  tablet Take 1 tablet (1 g total) by mouth in the morning, at noon, and at bedtime.   No facility-administered encounter medications on file as of 09/11/2020.    Functional Status:  No flowsheet data found.  Fall/Depression Screening: Fall Risk  07/01/2019 03/13/2018 01/12/2016  Falls in the past year? 0 No No   PHQ 2/9 Scores 04/23/2020 07/01/2019 03/13/2018 08/10/2016 01/12/2016 12/15/2015 02/04/2015  PHQ - 2 Score 0 1 1 3 2 3 2   PHQ- 9 Score - 2 7 7 12 14 7     Assessment:  Goals Addressed            This Visit's Progress   . [facility] - Make and Keep All Appointments   Not on track    Follow Up Date 10/08/2020  Timeframe:  Short-Term Goal Priority:  Medium Start Date:       09/12/2019                      Expected End Date:    10/12/2020                     - keep a calendar with appointment dates Encouraged to schedule    Why is this important?   Part of staying healthy is seeing the doctor for follow-up care.  If you forget your appointments, there are some things you can  do to stay on track.    Notes:   11/4  - reminded of PCP virtual visit tomorrow  12/15 - encouraged to call PCP to schedule follow up visit per last office visit AVS  1/28 - Reminded to call PCP and GI office to schedule visit    . [facility] - Manage My Emotions   Not on track    Follow Up Date 10/08/2020  Timeframe:  Long-Range Goal Priority:  High Start Date:       07/29/2020                      Expected End Date:   09/29/2020                      - begin personal counseling - join a support group - talk about feelings with a friend, family or spiritual advisor    Why is this important?   When you are stressed, down or upset, your body reacts too.  For example, your blood pressure may get higher; you may have a headache or stomachache.  When your emotions get the best of you, your body's ability to fight off cold and flu gets weak.  These steps will help you manage your emotions.     Notes:   11/4 -  encouraged support group again and counseling  12/15 - Re-educated on importance of stress management and counseling, encouraged to consider  1/28 - Discussed decreasing use of marijuana and seek counseling       Plan:  Follow-up:  Patient agrees to Care Plan and Follow-up. He is reluctant, if remains unable to progress, will consider closing case due to inability to progress toward goal.  Kemper Durie, RN, MSN [facility] Care Management  [facility] Manager [phone]

## 2020-09-23 ENCOUNTER — Other Ambulatory Visit: Payer: Self-pay

## 2020-09-23 ENCOUNTER — Encounter (HOSPITAL_COMMUNITY): Payer: Self-pay

## 2020-09-23 DIAGNOSIS — Z5321 Procedure and treatment not carried out due to patient leaving prior to being seen by health care provider: Secondary | ICD-10-CM | POA: Insufficient documentation

## 2020-09-23 DIAGNOSIS — R109 Unspecified abdominal pain: Secondary | ICD-10-CM | POA: Insufficient documentation

## 2020-09-23 NOTE — ED Triage Notes (Signed)
Pt presents with c/o abdominal pain that started today while he was at work.

## 2020-09-23 NOTE — ED Notes (Signed)
No V/S recorded from previous shift Called pt to obtain V/S, No answer

## 2020-09-24 ENCOUNTER — Emergency Department (HOSPITAL_COMMUNITY)
Admission: EM | Admit: 2020-09-24 | Discharge: 2020-09-24 | Disposition: A | Discharge status: Home / Self Care | Payer: Self-pay | Attending: Emergency Medicine | Admitting: Emergency Medicine

## 2020-09-24 ENCOUNTER — Encounter (HOSPITAL_COMMUNITY): Payer: Self-pay | Admitting: Emergency Medicine

## 2020-09-24 DIAGNOSIS — R109 Unspecified abdominal pain: Secondary | ICD-10-CM | POA: Insufficient documentation

## 2020-09-24 DIAGNOSIS — Z5321 Procedure and treatment not carried out due to patient leaving prior to being seen by health care provider: Secondary | ICD-10-CM | POA: Insufficient documentation

## 2020-09-24 LAB — URINALYSIS, ROUTINE W REFLEX MICROSCOPIC
Bilirubin Urine: NEGATIVE
Glucose, UA: NEGATIVE mg/dL
Ketones, ur: NEGATIVE mg/dL
Leukocytes,Ua: NEGATIVE
Nitrite: NEGATIVE
Protein, ur: NEGATIVE mg/dL
Specific Gravity, Urine: 1.025 (ref 1.005–1.030)
pH: 6 (ref 5.0–8.0)

## 2020-09-24 LAB — LIPASE, BLOOD: Lipase: 23 U/L (ref 11–51)

## 2020-09-24 LAB — COMPREHENSIVE METABOLIC PANEL
ALT: 29 U/L (ref 0–44)
AST: 24 U/L (ref 15–41)
Albumin: 3.8 g/dL (ref 3.5–5.0)
Alkaline Phosphatase: 61 U/L (ref 38–126)
Anion gap: 7 (ref 5–15)
BUN: 7 mg/dL (ref 6–20)
CO2: 28 mmol/L (ref 22–32)
Calcium: 9 mg/dL (ref 8.9–10.3)
Chloride: 107 mmol/L (ref 98–111)
Creatinine, Ser: 0.89 mg/dL (ref 0.61–1.24)
GFR, Estimated: >60 mL/min (ref >60)
Glucose, Bld: 100 mg/dL — ABNORMAL HIGH (ref 70–99)
Potassium: 4 mmol/L (ref 3.5–5.1)
Sodium: 142 mmol/L (ref 135–145)
Total Bilirubin: 0.9 mg/dL (ref 0.3–1.2)
Total Protein: 6.5 g/dL (ref 6.5–8.1)

## 2020-09-24 LAB — CBC
HCT: 38.4 % — ABNORMAL LOW (ref 39.0–52.0)
Hemoglobin: 12 g/dL — ABNORMAL LOW (ref 13.0–17.0)
MCH: 32.6 pg (ref 26.0–34.0)
MCHC: 31.3 g/dL (ref 30.0–36.0)
MCV: 104.3 fL — ABNORMAL HIGH (ref 80.0–100.0)
Platelets: 226 10*3/uL (ref 150–400)
RBC: 3.68 MIL/uL — ABNORMAL LOW (ref 4.22–5.81)
RDW: 12.7 % (ref 11.5–15.5)
WBC: 5.2 10*3/uL (ref 4.0–10.5)
nRBC: 0 % (ref 0.0–0.2)

## 2020-09-24 LAB — URINALYSIS, MICROSCOPIC (REFLEX)
Bacteria, UA: NONE SEEN
Squamous Epithelial / HPF: NONE SEEN (ref 0–5)

## 2020-09-24 NOTE — ED Triage Notes (Signed)
Patient here for evaluation of abdominal pain that starts every morning for the last eight years, states he has been told before that the pain was probably related to daily cannabis consumption. Patient has not stopped smoking cannabis.

## 2020-09-24 NOTE — ED Notes (Signed)
caled pt 3x for vitals no response.

## 2020-09-25 ENCOUNTER — Encounter (HOSPITAL_COMMUNITY): Payer: Self-pay

## 2020-09-25 ENCOUNTER — Emergency Department (HOSPITAL_COMMUNITY)
Admission: EM | Admit: 2020-09-25 | Discharge: 2020-09-25 | Disposition: A | Discharge status: Home / Self Care | Payer: Self-pay | Attending: Emergency Medicine | Admitting: Emergency Medicine

## 2020-09-25 ENCOUNTER — Other Ambulatory Visit: Payer: Self-pay

## 2020-09-25 ENCOUNTER — Other Ambulatory Visit: Payer: Self-pay | Admitting: Emergency Medicine

## 2020-09-25 DIAGNOSIS — G8929 Other chronic pain: Secondary | ICD-10-CM | POA: Insufficient documentation

## 2020-09-25 DIAGNOSIS — R1115 Cyclical vomiting syndrome unrelated to migraine: Secondary | ICD-10-CM

## 2020-09-25 DIAGNOSIS — K219 Gastro-esophageal reflux disease without esophagitis: Secondary | ICD-10-CM | POA: Insufficient documentation

## 2020-09-25 DIAGNOSIS — K2970 Gastritis, unspecified, without bleeding: Secondary | ICD-10-CM

## 2020-09-25 DIAGNOSIS — R1084 Generalized abdominal pain: Secondary | ICD-10-CM | POA: Insufficient documentation

## 2020-09-25 DIAGNOSIS — F17290 Nicotine dependence, other tobacco product, uncomplicated: Secondary | ICD-10-CM | POA: Insufficient documentation

## 2020-09-25 LAB — URINALYSIS, ROUTINE W REFLEX MICROSCOPIC
Bilirubin Urine: NEGATIVE
Glucose, UA: NEGATIVE mg/dL
Hgb urine dipstick: NEGATIVE
Ketones, ur: NEGATIVE mg/dL
Leukocytes,Ua: NEGATIVE
Nitrite: NEGATIVE
Protein, ur: NEGATIVE mg/dL
Specific Gravity, Urine: 1.025 (ref 1.005–1.030)
pH: 6 (ref 5.0–8.0)

## 2020-09-25 LAB — CBC
HCT: 36.4 % — ABNORMAL LOW (ref 39.0–52.0)
Hemoglobin: 11.8 g/dL — ABNORMAL LOW (ref 13.0–17.0)
MCH: 33 pg (ref 26.0–34.0)
MCHC: 32.4 g/dL (ref 30.0–36.0)
MCV: 101.7 fL — ABNORMAL HIGH (ref 80.0–100.0)
Platelets: 228 10*3/uL (ref 150–400)
RBC: 3.58 MIL/uL — ABNORMAL LOW (ref 4.22–5.81)
RDW: 12.5 % (ref 11.5–15.5)
WBC: 6.4 10*3/uL (ref 4.0–10.5)
nRBC: 0 % (ref 0.0–0.2)

## 2020-09-25 LAB — COMPREHENSIVE METABOLIC PANEL
ALT: 38 U/L (ref 0–44)
AST: 30 U/L (ref 15–41)
Albumin: 4.1 g/dL (ref 3.5–5.0)
Alkaline Phosphatase: 64 U/L (ref 38–126)
Anion gap: 9 (ref 5–15)
BUN: 13 mg/dL (ref 6–20)
CO2: 28 mmol/L (ref 22–32)
Calcium: 9.5 mg/dL (ref 8.9–10.3)
Chloride: 105 mmol/L (ref 98–111)
Creatinine, Ser: 0.82 mg/dL (ref 0.61–1.24)
GFR, Estimated: >60 mL/min (ref >60)
Glucose, Bld: 77 mg/dL (ref 70–99)
Potassium: 3.9 mmol/L (ref 3.5–5.1)
Sodium: 142 mmol/L (ref 135–145)
Total Bilirubin: 1.6 mg/dL — ABNORMAL HIGH (ref 0.3–1.2)
Total Protein: 6.9 g/dL (ref 6.5–8.1)

## 2020-09-25 LAB — LIPASE, BLOOD: Lipase: 24 U/L (ref 11–51)

## 2020-09-25 MED ORDER — PANTOPRAZOLE SODIUM 20 MG PO TBEC: 20.0000 mg | DELAYED_RELEASE_TABLET | Freq: Every day | ORAL | 0 refills | Status: DC

## 2020-09-25 MED ORDER — SUCRALFATE 1 G PO TABS: 1.0000 g | ORAL_TABLET | Freq: Three times a day (TID) | ORAL | 0 refills | Status: DC

## 2020-09-25 NOTE — ED Notes (Signed)
Patient left before discharge process could be completed and AVS given.

## 2020-09-25 NOTE — ED Triage Notes (Signed)
Pt arrived via walk in, c/o diffuse abd pain, ongoing for "awhile". Denies any n.v or diarrhea at this time.

## 2020-09-25 NOTE — Discharge Instructions (Signed)
Please call the  and wellness clinic.

## 2020-09-25 NOTE — ED Provider Notes (Signed)
[facility]-EMERGENCY DEPT Provider Note   CSN: [id number] Arrival date & time: 09/25/20  1323     History Chief Complaint  Patient presents with  . Abdominal Pain    Matthew Scott is a 40 y.o. male.  HPI  Patient is a 40 year old male with past medical history significant for chronic abdominal pain, cyclic vomiting, gastritis, gastroparesis, polysubstance abuse  Patient is presented today for ongoing abdominal pain.  He is uncertain of how long it has been going on for.  He "hurts all the time for as long as I can remember "he denies any nausea vomiting or diarrhea no urinary symptoms such as frequency, urgency, dysuria, hematuria  He states that he is out of his medications Protonix and sucralfate.  He states that these medications help with his symptoms.  He denies any other associate symptoms.  No aggravating mitigating factors.     Past Medical History:  Diagnosis Date  . 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 Chronic abdominal pain   . Cyclical vomiting syndrome   . GASTRITIS 12/09/2009   Qualifier: Diagnosis of  By: Daphine Deutscher FNP, Zena Amos    . Gastroparesis   . 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 Nausea and vomiting    chronic, recurrent  . Polysubstance abuse [facility])     Patient Active Problem List   Diagnosis Date Noted  . 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 Intractable nausea and vomiting 08/04/2018  . Elevated AST (SGOT) 08/04/2018  . Uncontrollable vomiting 03/10/2016  . Anemia 03/10/2016  . Metatarsal fracture 12/22/2015  . Essential hypertension 12/22/2015  . Night sweats 01/21/2015  . GERD (gastroesophageal reflux disease) 01/21/2015  . Gastritis 12/19/2014  . Hyperbilirubinemia 12/19/2014  . Elevated bilirubin   . Nausea & vomiting  12/14/2014  . Severe tetrahydrocannabinol (THC) dependence (HCC) 12/14/2014  . Alcohol abuse 12/14/2014  . Cyclic vomiting syndrome 12/06/2014  . 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 Gastroparesis 05/11/2012  . Abdominal pain 05/06/2012  . Polysubstance abuse (HCC) 05/06/2012  . CHOLECYSTECTOMY, HX OF 11/25/2009    Past Surgical History:  Procedure Laterality Date  . CHOLECYSTECTOMY  11/2009       Family History  Problem Relation Age of Onset  . Other Father   . Diabetes Father   . Hypertension Father     Social History   Tobacco Use  . Smoking status: Current Some Day Smoker    Years: 8.00    Types: Cigars    Last attempt to quit: 12/15/2014    Years since quitting: 5.7  . Smokeless tobacco: Never Used  Vaping Use  . Vaping Use: Never used  Substance Use Topics  . Alcohol use: No    Alcohol/week: 0.0 standard drinks  . Drug use: Yes    Types: Marijuana    Comment: Last used: 4 days ago per patient on 04/30/2018    Home Medications Prior to Admission medications   Medication Sig Start Date End Date Taking? Authorizing Provider  cyclobenzaprine (FLEXERIL) 10 MG tablet Take 1 tablet (10 mg total) by mouth 2 (two) times daily as needed for muscle spasms. 06/12/20   Sabas Sous, MD  dicyclomine (BENTYL) 20 MG tablet TAKE 1 TABLET BY MOUTH EVERY DAY 02/06/20   Hoy Register, MD  ibuprofen (ADVIL) 600 MG tablet Take 1 tablet (600 mg total) by mouth every 6 (six) hours as needed. 01/29/20   Fayrene Helper, PA-C  methocarbamol (ROBAXIN) 500 MG tablet Take 1 tablet (500 mg total) by mouth 2 (two) times daily. 06/07/20   Maxwell Caul, PA-C  ondansetron (ZOFRAN) 4 MG tablet Take 1 tablet (4 mg total) by mouth every 6 (six) hours as needed for nausea. 02/27/20   Anders Simmonds, PA-C  pantoprazole (PROTONIX) 20 MG tablet Take 1 tablet (20 mg total) by mouth daily. 09/25/20 10/25/20  Gailen Shelter, PA  promethazine (PHENERGAN) 50 MG  tablet Take 1 tablet (50 mg total) by mouth every 6 (six) hours as needed for nausea or vomiting. 05/11/20   Liberty Handy, PA-C  sucralfate (CARAFATE) 1 g tablet Take 1 tablet (1 g total) by mouth in the morning, at noon, and at bedtime. 09/25/20   Gailen Shelter, PA    Allergies    Patient has no known allergies.  Review of Systems   Review of Systems  Constitutional: Negative for chills and fever.  HENT: Negative for congestion.   Eyes: Negative for pain.  Respiratory: Negative for cough and shortness of breath.   Cardiovascular: Negative for chest pain and leg swelling.  Gastrointestinal: Positive for abdominal pain. Negative for diarrhea, nausea and vomiting.  Genitourinary: Negative for dysuria.  Musculoskeletal: Negative for myalgias.  Skin: Negative for rash.  Neurological: Negative for dizziness and headaches.    Physical Exam Updated Vital Signs BP 129/87   Pulse 66   Temp 98.4 F (36.9 C) (Oral)   Resp 19   SpO2 100%   Physical Exam Vitals and nursing note reviewed.  Constitutional:      General: He is not in acute distress. HENT:     Head: Normocephalic and atraumatic.     Nose: Nose normal.     Mouth/Throat:     Mouth: Mucous membranes are moist.  Eyes:     General: No scleral icterus. Cardiovascular:     Rate and Rhythm: Normal rate and regular rhythm.     Pulses: Normal pulses.     Heart sounds: Normal heart sounds.  Pulmonary:     Effort: Pulmonary effort is normal. No respiratory distress.     Breath sounds: No wheezing.  Abdominal:     Palpations: Abdomen is soft.     Tenderness: There is no abdominal tenderness. There is no right CVA tenderness or left CVA tenderness.     Comments: Soft nontender abdomen.  No guarding or rebound.  No rigidity.  No masses.  Musculoskeletal:     Cervical back: Normal range of motion.     Right lower leg: No edema.     Left lower leg: No edema.  Skin:    General: Skin is warm and dry.     Capillary  Refill: Capillary refill takes less than 2 seconds.  Neurological:     Mental Status: He is alert. Mental status is at baseline.  Psychiatric:        Mood and Affect: Mood normal.        Behavior: Behavior normal.     ED Results / Procedures / Treatments   Labs (all labs ordered are listed, but only abnormal results are displayed) Labs Reviewed  COMPREHENSIVE METABOLIC PANEL - Abnormal; Notable for the following components:      Result Value   Total Bilirubin 1.6 (*)    All other components within normal  limits  CBC - Abnormal; Notable for the following components:   RBC 3.58 (*)    Hemoglobin 11.8 (*)    HCT 36.4 (*)    MCV 101.7 (*)    All other components within normal limits  LIPASE, BLOOD  URINALYSIS, ROUTINE W REFLEX MICROSCOPIC    EKG None  Radiology No results found.  Procedures Procedures   Medications Ordered in ED Medications - No data to display  ED Course  I have reviewed the triage vital signs and the nursing notes.  Pertinent labs & imaging results that were available during my care of the patient were reviewed by me and considered in my medical decision making (see chart for details).  Patient is a 40 year old male with past medical history significant for chronic abdominal pain. He is seen frequently in the ER here he has had 10 CT scans and ultrasounds done cumulatively over the past 3 years.  This is led to his chronic abdominal pain diagnosis.  He presents today with abdominal pain that he states is similar to his chronic abdominal pain. He states that he is without his medications that he takes which includes Protonix and Carafate.  Physical exam is without any abdominal tenderness guarding or rebound. Overall he is well-appearing his vital signs within normal months.  Lipase within normal limits doubt pancreatitis. Urinalysis without any evidence of infection.  Clinical Course as of 09/25/20 2045  Fri Sep 25, 2020  2022  I personally  reviewed all laboratory work and imaging.  Metabolic panel without any acute abnormality specifically kidney function within normal limits and no significant electrolyte abnormalities. CBC without leukocytosis or significant anemia (anemia is at baselin)   [WF]    Clinical Course User Index [WF] Gailen Shelter, [place]   MDM Rules/Calculators/A&P                          We will discharge patient with close follow-up with PCP. No negation for further evaluation/work-up today. Will prescribe his medications to his pharmacy.  Matthew Scott was evaluated in Emergency Department on 09/25/2020 for the symptoms described in the history of present illness. He was evaluated in the context of the global COVID-19 pandemic, which necessitated consideration that the patient might be at risk for infection with the SARS-CoV-2 virus that causes COVID-19. Institutional protocols and algorithms that pertain to the evaluation of patients at risk for COVID-19 are in a state of rapid change based on information released by regulatory bodies including the CDC and federal and state organizations. These policies and algorithms were followed during the patient's care in the ED.  Final Clinical Impression(s) / ED Diagnoses Final diagnoses:  Generalized abdominal pain    Rx / DC Orders ED Discharge Orders         Ordered    pantoprazole (PROTONIX) 20 MG tablet  Daily        09/25/20 1856    sucralfate (CARAFATE) 1 g tablet  3 times daily        09/25/20 1856           Gailen Shelter, [place] 09/25/20 2102    Derwood Kaplan, MD 09/27/20 1552

## 2020-09-28 MED FILL — SUCRALFATE 1 GM TABLET: 1 | 30 days supply | Qty: 90 | Fill #0

## 2020-09-28 MED FILL — PANTOPRAZOLE SOD DR 20 MG T: 20 | 30 days supply | Qty: 30 | Fill #0

## 2020-09-30 ENCOUNTER — Emergency Department (HOSPITAL_COMMUNITY)
Admission: EM | Admit: 2020-09-30 | Discharge: 2020-09-30 | Disposition: A | Discharge status: Home / Self Care | Payer: Self-pay | Attending: Emergency Medicine | Admitting: Emergency Medicine

## 2020-09-30 ENCOUNTER — Emergency Department (HOSPITAL_COMMUNITY): Payer: Self-pay

## 2020-09-30 ENCOUNTER — Other Ambulatory Visit: Payer: Self-pay | Admitting: Emergency Medicine

## 2020-09-30 ENCOUNTER — Encounter (HOSPITAL_COMMUNITY): Payer: Self-pay | Admitting: Emergency Medicine

## 2020-09-30 DIAGNOSIS — R1013 Epigastric pain: Secondary | ICD-10-CM

## 2020-09-30 DIAGNOSIS — R197 Diarrhea, unspecified: Secondary | ICD-10-CM | POA: Insufficient documentation

## 2020-09-30 DIAGNOSIS — G8929 Other chronic pain: Secondary | ICD-10-CM | POA: Insufficient documentation

## 2020-09-30 DIAGNOSIS — R1115 Cyclical vomiting syndrome unrelated to migraine: Secondary | ICD-10-CM

## 2020-09-30 DIAGNOSIS — R110 Nausea: Secondary | ICD-10-CM | POA: Insufficient documentation

## 2020-09-30 DIAGNOSIS — R109 Unspecified abdominal pain: Secondary | ICD-10-CM | POA: Insufficient documentation

## 2020-09-30 DIAGNOSIS — F17290 Nicotine dependence, other tobacco product, uncomplicated: Secondary | ICD-10-CM | POA: Insufficient documentation

## 2020-09-30 DIAGNOSIS — I10 Essential (primary) hypertension: Secondary | ICD-10-CM | POA: Insufficient documentation

## 2020-09-30 LAB — URINALYSIS, ROUTINE W REFLEX MICROSCOPIC
Bilirubin Urine: NEGATIVE
Glucose, UA: NEGATIVE mg/dL
Ketones, ur: NEGATIVE mg/dL
Leukocytes,Ua: NEGATIVE
Nitrite: NEGATIVE
Protein, ur: NEGATIVE mg/dL
Specific Gravity, Urine: >1.03 — ABNORMAL HIGH (ref 1.005–1.030)
pH: 6 (ref 5.0–8.0)

## 2020-09-30 LAB — COMPREHENSIVE METABOLIC PANEL
ALT: 25 U/L (ref 0–44)
AST: 19 U/L (ref 15–41)
Albumin: 3.9 g/dL (ref 3.5–5.0)
Alkaline Phosphatase: 63 U/L (ref 38–126)
Anion gap: 9 (ref 5–15)
BUN: 10 mg/dL (ref 6–20)
CO2: 24 mmol/L (ref 22–32)
Calcium: 9.5 mg/dL (ref 8.9–10.3)
Chloride: 106 mmol/L (ref 98–111)
Creatinine, Ser: 0.78 mg/dL (ref 0.61–1.24)
GFR, Estimated: >60 mL/min (ref >60)
Glucose, Bld: 107 mg/dL — ABNORMAL HIGH (ref 70–99)
Potassium: 3.9 mmol/L (ref 3.5–5.1)
Sodium: 139 mmol/L (ref 135–145)
Total Bilirubin: 1.6 mg/dL — ABNORMAL HIGH (ref 0.3–1.2)
Total Protein: 6.8 g/dL (ref 6.5–8.1)

## 2020-09-30 LAB — CBC
HCT: 39.4 % (ref 39.0–52.0)
Hemoglobin: 12.5 g/dL — ABNORMAL LOW (ref 13.0–17.0)
MCH: 32.7 pg (ref 26.0–34.0)
MCHC: 31.7 g/dL (ref 30.0–36.0)
MCV: 103.1 fL — ABNORMAL HIGH (ref 80.0–100.0)
Platelets: 245 10*3/uL (ref 150–400)
RBC: 3.82 MIL/uL — ABNORMAL LOW (ref 4.22–5.81)
RDW: 12.9 % (ref 11.5–15.5)
WBC: 6.9 10*3/uL (ref 4.0–10.5)
nRBC: 0 % (ref 0.0–0.2)

## 2020-09-30 LAB — URINALYSIS, MICROSCOPIC (REFLEX): Squamous Epithelial / HPF: NONE SEEN (ref 0–5)

## 2020-09-30 LAB — LIPASE, BLOOD: Lipase: 27 U/L (ref 11–51)

## 2020-09-30 MED ORDER — IOHEXOL 300 MG/ML  SOLN
100.0000 mL | Freq: Once | INTRAMUSCULAR | Status: AC | PRN
  Administered 2020-09-30: 100 mL via INTRAVENOUS

## 2020-09-30 MED ORDER — PANTOPRAZOLE SODIUM 40 MG IV SOLR
40.0000 mg | Freq: Once | INTRAVENOUS | Status: AC
  Administered 2020-09-30: 40 mg via INTRAVENOUS
  Filled 2020-09-30: qty 40

## 2020-09-30 MED ORDER — SODIUM CHLORIDE 0.9 % IV BOLUS
1000.0000 mL | Freq: Once | INTRAVENOUS | Status: AC
  Administered 2020-09-30: 1000 mL via INTRAVENOUS

## 2020-09-30 MED ORDER — FENTANYL CITRATE (PF) 100 MCG/2ML IJ SOLN
50.0000 ug | Freq: Once | INTRAMUSCULAR | Status: AC
  Administered 2020-09-30: 50 ug via INTRAVENOUS
  Filled 2020-09-30: qty 2

## 2020-09-30 MED ORDER — DICYCLOMINE HCL 20 MG PO TABS: 20.0000 mg | ORAL_TABLET | Freq: Every day | ORAL | 0 refills | Status: DC

## 2020-09-30 MED FILL — DICYCLOMINE 20 MG TABLET: 20 | 30 days supply | Qty: 30 | Fill #0

## 2020-09-30 NOTE — Discharge Instructions (Addendum)
Your CT scan and labs show no acute abnormalities.  Use Maalox, Tylenol and the medication I have prescribed for treatment of your abdominal pain AVOID the following: Alcohol Marijuana High fat foods Spicy foods Heavy meals.  Abdominal (belly) pain can be caused by many things. Your caregiver performed an examination and possibly ordered blood/urine tests and imaging (CT scan, x-rays, ultrasound). Many cases can be observed and treated at home after initial evaluation in the emergency department. Even though you are being discharged home, abdominal pain can be unpredictable. Therefore, you need a repeated exam if your pain does not resolve, returns, or worsens. Most patients with abdominal pain don't have to be admitted to the hospital or have surgery, but serious problems like appendicitis and gallbladder attacks can start out as nonspecific pain. Many abdominal conditions cannot be diagnosed in one visit, so follow-up evaluations are very important. SEEK IMMEDIATE MEDICAL ATTENTION IF: The pain does not go away or becomes severe.  A temperature above 101 develops.  Repeated vomiting occurs (multiple episodes).  The pain becomes localized to portions of the abdomen. The right side could possibly be appendicitis. In an adult, the left lower portion of the abdomen could be colitis or diverticulitis.  Blood is being passed in stools or vomit (bright red or black tarry stools).  Return also if you develop chest pain, difficulty breathing, dizziness or fainting, or become confused, poorly responsive, or inconsolable (young children).

## 2020-09-30 NOTE — ED Triage Notes (Signed)
Pt arrives to Ed with chronic abd pain for the last 9 years after gallbladder sx. Seen recently on 2/11 for same.

## 2020-09-30 NOTE — ED Provider Notes (Addendum)
Here with abd pain. Hx of chronic abd pain. No imaging six days ago. S/p cholecystectomy. Heavy THC use. Awaiting scans.   40 year old male here with chronic abdominal pain.  His labs appear without significant abnormality.  I reviewed the patient's CT scan images which show no acute abnormalities.  Plan to discharge the patient at this time.  He should follow-up with primary care physician regarding his elevated blood pressure.    Arthor Captain, PA-C 09/30/20 1626    Gwyneth Sprout, MD 10/02/20 1523

## 2020-09-30 NOTE — ED Provider Notes (Signed)
Matthew Scott [facility] EMERGENCY DEPARTMENT Provider Note   CSN: [id number] Arrival date & time: 09/30/20  1037     History Chief Complaint  Patient presents with  . Abdominal Pain    Matthew Scott is a 40 y.o. male history of biliary dyskinesia, chronic abdominal pain, cyclical vomiting syndrome, gastritis, polysubstance abuse  Patient arrives for abdominal pain he reports chronic abdominal pain for many years but is worsened over the past week.  Patient reports he was seen here earlier this week for the same but pain has not proved he describes sharp right sided abdominal pain constant nonradiating worsened with eating somewhat improved with hot water on his abdomen, pain is currently severe in intensity.  Reports nausea without vomiting describes small episodes of nonbloody diarrhea.  Denies chest pain/shortness of breath, fever/chills, cough, dysuria/hematuria, hematochezia, melena, emesis or any additional concerns  Of note patient denies alcohol use but does report regular marijuana use HPI     Past Medical History:  Diagnosis Date  . 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 Chronic abdominal pain   . Cyclical vomiting syndrome   . GASTRITIS 12/09/2009   Qualifier: Diagnosis of  By: Daphine Deutscher FNP, Zena Amos    . Gastroparesis   . 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 Nausea and vomiting    chronic, recurrent  . Polysubstance abuse [facility])     Patient Active Problem List   Diagnosis Date Noted  . 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 Intractable nausea and vomiting 08/04/2018  . Elevated AST (SGOT) 08/04/2018  . Uncontrollable vomiting 03/10/2016  . Anemia 03/10/2016  . Metatarsal fracture 12/22/2015  . Essential hypertension 12/22/2015  . Night sweats 01/21/2015  . GERD  (gastroesophageal reflux disease) 01/21/2015  . Gastritis 12/19/2014  . Hyperbilirubinemia 12/19/2014  . Elevated bilirubin   . Nausea & vomiting 12/14/2014  . Severe tetrahydrocannabinol (THC) dependence (HCC) 12/14/2014  . Alcohol abuse 12/14/2014  . Cyclic vomiting syndrome 12/06/2014  . 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 Gastroparesis 05/11/2012  . Abdominal pain 05/06/2012  . Polysubstance abuse (HCC) 05/06/2012  . CHOLECYSTECTOMY, HX OF 11/25/2009    Past Surgical History:  Procedure Laterality Date  . CHOLECYSTECTOMY  11/2009       Family History  Problem Relation Age of Onset  . Other Father   . Diabetes Father   . Hypertension Father     Social History   Tobacco Use  . Smoking status: Current Some Day Smoker    Years: 8.00    Types: Cigars    Last attempt to quit: 12/15/2014    Years since quitting: 5.7  . Smokeless tobacco: Never Used  Vaping Use  . Vaping Use: Never used  Substance Use Topics  . Alcohol use: No    Alcohol/week: 0.0 standard drinks  . Drug use: Yes    Types: Marijuana    Comment: Last used: 4 days ago per patient on 04/30/2018    Home Medications Prior to Admission medications   Medication Sig Start Date End Date Taking? Authorizing Provider  cyclobenzaprine (FLEXERIL) 10 MG tablet Take 1 tablet (10 mg total) by mouth 2 (two) times daily as needed for muscle spasms. 06/12/20   Sabas Sous, MD  dicyclomine (BENTYL) 20 MG tablet TAKE 1 TABLET  BY MOUTH EVERY DAY 02/06/20   Hoy Register, MD  ibuprofen (ADVIL) 600 MG tablet Take 1 tablet (600 mg total) by mouth every 6 (six) hours as needed. 01/29/20   Fayrene Helper, PA-C  methocarbamol (ROBAXIN) 500 MG tablet Take 1 tablet (500 mg total) by mouth 2 (two) times daily. 06/07/20   Maxwell Caul, PA-C  ondansetron (ZOFRAN) 4 MG tablet Take 1 tablet (4 mg total) by mouth every 6 (six) hours as needed for nausea. 02/27/20   Anders Simmonds, PA-C   pantoprazole (PROTONIX) 20 MG tablet Take 1 tablet (20 mg total) by mouth daily. 09/25/20 10/25/20  Gailen Shelter, PA  promethazine (PHENERGAN) 50 MG tablet Take 1 tablet (50 mg total) by mouth every 6 (six) hours as needed for nausea or vomiting. 05/11/20   Liberty Handy, PA-C  sucralfate (CARAFATE) 1 g tablet Take 1 tablet (1 g total) by mouth in the morning, at noon, and at bedtime. 09/25/20   Gailen Shelter, PA    Allergies    Patient has no known allergies.  Review of Systems   Review of Systems Ten systems are reviewed and are negative for acute change except as noted in the HPI  Physical Exam Updated Vital Signs BP (!) 185/104 (BP Location: Right Arm)   Pulse 84   Temp 98 F (36.7 C) (Oral)   Resp (!) 22   SpO2 100%   Physical Exam Constitutional:      General: He is not in acute distress.    Appearance: Normal appearance. He is well-developed. He is not ill-appearing or diaphoretic.  HENT:     Head: Normocephalic and atraumatic.  Eyes:     General: Vision grossly intact. Gaze aligned appropriately.     Pupils: Pupils are equal, round, and reactive to light.  Neck:     Trachea: Trachea and phonation normal.  Pulmonary:     Effort: Pulmonary effort is normal. No respiratory distress.  Abdominal:     General: There is no distension.     Palpations: Abdomen is soft.     Tenderness: There is abdominal tenderness in the right upper quadrant and right lower quadrant. There is no guarding or rebound.  Musculoskeletal:        General: Normal range of motion.     Cervical back: Normal range of motion.  Skin:    General: Skin is warm and dry.  Neurological:     Mental Status: He is alert.     GCS: GCS eye subscore is 4. GCS verbal subscore is 5. GCS motor subscore is 6.     Comments: Speech is clear and goal oriented, follows commands Major Cranial nerves without deficit, no facial droop Moves extremities without ataxia, coordination intact  Psychiatric:         Behavior: Behavior normal.     ED Results / Procedures / Treatments   Labs (all labs ordered are listed, but only abnormal results are displayed) Labs Reviewed  COMPREHENSIVE METABOLIC PANEL - Abnormal; Notable for the following components:      Result Value   Glucose, Bld 107 (*)    Total Bilirubin 1.6 (*)    All other components within normal limits  CBC - Abnormal; Notable for the following components:   RBC 3.82 (*)    Hemoglobin 12.5 (*)    MCV 103.1 (*)    All other components within normal limits  LIPASE, BLOOD  URINALYSIS, ROUTINE W REFLEX MICROSCOPIC    EKG  None  Radiology No results found.  Procedures Procedures   Medications Ordered in ED Medications - No data to display  ED Course  I have reviewed the triage vital signs and the nursing notes.  Pertinent labs & imaging results that were available during my care of the patient were reviewed by me and considered in my medical decision making (see chart for details).    MDM Rules/Calculators/A&P                         Additional history obtained from: 1. Nursing notes from this visit. 2. Review of electronic medical records patient seen in the ER on 09/25/2020 with diagnosis of generalized abdominal pain he had reassuring work-up including CBC, lipase, CMP and urinalysis and was encouraged to follow-up with his primary care provider. ============== 40 year old male with chronic abdominal pain presents today for worsening abdominal pain for the last 7 days he was seen here as above and had a reassuring work-up.  On my examination patient appears very uncomfortable holding the right side of his abdomen endorsing some diarrhea nausea but without vomiting or fever.  He is guarding his abdomen no recent imaging will obtain CT abdomen pelvis for further evaluation give pain medication, Protonix and IV fluids. - I ordered, reviewed and interpreted labs which include: CBC without leukocytosis to suggest infectious  process, mild anemia of 12.5 appears baseline. CMP shows no emergent electrolyte derangement, AKI, LFT elevations or gap.  Bilirubin of 1.6 appears similar to prior. Lipase within normal limits, doubt pancreatitis. Urinalysis without evidence of infection.  Care handoff given to Arthor Captain PA-C at shift change.  Plan of care is to follow-up on CT abdomen pelvis and reassess the patient.  Final disposition per oncoming team pending no acute findings anticipate likely discharge.   Note: Portions of this report may have been transcribed using voice recognition software. Every effort was made to ensure accuracy; however, inadvertent computerized transcription errors may still be present. Final Clinical Impression(s) / ED Diagnoses Final diagnoses:  None    Rx / DC Orders ED Discharge Orders    None       Elizabeth Palau 09/30/20 1508    Virgina Norfolk, DO 09/30/20 1512

## 2020-10-08 ENCOUNTER — Other Ambulatory Visit: Payer: Self-pay | Admitting: *Deleted

## 2020-10-08 NOTE — Patient Outreach (Signed)
[facility]) Care Management  10/08/2020  Matthew Scott August 13, 1981 [id number]   Noted that member is no longer eligible for program as he no longer has eligible insurance and is self pay.  Call placed to member to discuss, state he is just waking up and this is not a good time to talk.  Will close case, will await call back to inform member of case closure.  Will also send letters to member and PCP regarding case closure.  Kemper Durie, [place], MSN [facility] Care Management  [facility] Manager [phone]

## 2020-10-20 ENCOUNTER — Emergency Department (HOSPITAL_COMMUNITY)
Admission: EM | Admit: 2020-10-20 | Discharge: 2020-10-20 | Disposition: A | Discharge status: Home / Self Care | Payer: Self-pay | Attending: Emergency Medicine | Admitting: Emergency Medicine

## 2020-10-20 ENCOUNTER — Encounter (HOSPITAL_COMMUNITY): Payer: Self-pay

## 2020-10-20 ENCOUNTER — Other Ambulatory Visit: Payer: Self-pay | Admitting: Emergency Medicine

## 2020-10-20 ENCOUNTER — Other Ambulatory Visit: Payer: Self-pay

## 2020-10-20 DIAGNOSIS — I10 Essential (primary) hypertension: Secondary | ICD-10-CM | POA: Insufficient documentation

## 2020-10-20 DIAGNOSIS — R112 Nausea with vomiting, unspecified: Secondary | ICD-10-CM | POA: Insufficient documentation

## 2020-10-20 DIAGNOSIS — R197 Diarrhea, unspecified: Secondary | ICD-10-CM | POA: Insufficient documentation

## 2020-10-20 DIAGNOSIS — R1011 Right upper quadrant pain: Secondary | ICD-10-CM | POA: Insufficient documentation

## 2020-10-20 DIAGNOSIS — F17290 Nicotine dependence, other tobacco product, uncomplicated: Secondary | ICD-10-CM | POA: Insufficient documentation

## 2020-10-20 LAB — CBC WITH DIFFERENTIAL/PLATELET
Abs Immature Granulocytes: 0.01 10*3/uL (ref 0.00–0.07)
Basophils Absolute: 0 10*3/uL (ref 0.0–0.1)
Basophils Relative: 0 %
Eosinophils Absolute: 0.1 10*3/uL (ref 0.0–0.5)
Eosinophils Relative: 1 %
HCT: 37.6 % — ABNORMAL LOW (ref 39.0–52.0)
Hemoglobin: 12.3 g/dL — ABNORMAL LOW (ref 13.0–17.0)
Immature Granulocytes: 0 %
Lymphocytes Relative: 44 %
Lymphs Abs: 2.2 10*3/uL (ref 0.7–4.0)
MCH: 32.9 pg (ref 26.0–34.0)
MCHC: 32.7 g/dL (ref 30.0–36.0)
MCV: 100.5 fL — ABNORMAL HIGH (ref 80.0–100.0)
Monocytes Absolute: 0.5 10*3/uL (ref 0.1–1.0)
Monocytes Relative: 9 %
Neutro Abs: 2.3 10*3/uL (ref 1.7–7.7)
Neutrophils Relative %: 46 %
Platelets: 214 10*3/uL (ref 150–400)
RBC: 3.74 MIL/uL — ABNORMAL LOW (ref 4.22–5.81)
RDW: 12.2 % (ref 11.5–15.5)
WBC: 5.1 10*3/uL (ref 4.0–10.5)
nRBC: 0 % (ref 0.0–0.2)

## 2020-10-20 LAB — URINALYSIS, ROUTINE W REFLEX MICROSCOPIC
Bilirubin Urine: NEGATIVE
Glucose, UA: NEGATIVE mg/dL
Hgb urine dipstick: NEGATIVE
Ketones, ur: NEGATIVE mg/dL
Leukocytes,Ua: NEGATIVE
Nitrite: NEGATIVE
Protein, ur: NEGATIVE mg/dL
Specific Gravity, Urine: 1.029 (ref 1.005–1.030)
pH: 5 (ref 5.0–8.0)

## 2020-10-20 LAB — COMPREHENSIVE METABOLIC PANEL
ALT: 16 U/L (ref 0–44)
AST: 19 U/L (ref 15–41)
Albumin: 3.6 g/dL (ref 3.5–5.0)
Alkaline Phosphatase: 57 U/L (ref 38–126)
Anion gap: 8 (ref 5–15)
BUN: 12 mg/dL (ref 6–20)
CO2: 25 mmol/L (ref 22–32)
Calcium: 9.2 mg/dL (ref 8.9–10.3)
Chloride: 106 mmol/L (ref 98–111)
Creatinine, Ser: 0.85 mg/dL (ref 0.61–1.24)
GFR, Estimated: >60 mL/min (ref >60)
Glucose, Bld: 97 mg/dL (ref 70–99)
Potassium: 3.4 mmol/L — ABNORMAL LOW (ref 3.5–5.1)
Sodium: 139 mmol/L (ref 135–145)
Total Bilirubin: 1.2 mg/dL (ref 0.3–1.2)
Total Protein: 6.3 g/dL — ABNORMAL LOW (ref 6.5–8.1)

## 2020-10-20 LAB — LIPASE, BLOOD: Lipase: 32 U/L (ref 11–51)

## 2020-10-20 MED ORDER — ONDANSETRON HCL 4 MG/2ML IJ SOLN
4.0000 mg | Freq: Once | INTRAMUSCULAR | Status: AC
Start: 2020-10-20 — End: 2020-10-20
  Administered 2020-10-20: 4 mg via INTRAVENOUS
  Filled 2020-10-20: qty 2

## 2020-10-20 MED ORDER — LACTATED RINGERS IV BOLUS
1000.0000 mL | Freq: Once | INTRAVENOUS | Status: AC
  Administered 2020-10-20: 1000 mL via INTRAVENOUS

## 2020-10-20 MED ORDER — ONDANSETRON 8 MG PO TBDP: 8.0000 mg | ORAL_TABLET | Freq: Three times a day (TID) | ORAL | 0 refills | Status: DC | PRN

## 2020-10-20 MED ORDER — MORPHINE SULFATE (PF) 4 MG/ML IV SOLN
4.0000 mg | Freq: Once | INTRAVENOUS | Status: AC
  Administered 2020-10-20: 4 mg via INTRAVENOUS
  Filled 2020-10-20: qty 1

## 2020-10-20 MED FILL — ONDANSETRON ODT 8 MG TABLET: 8 | 6 days supply | Qty: 20 | Fill #0

## 2020-10-20 NOTE — ED Provider Notes (Signed)
[facility] EMERGENCY DEPARTMENT Provider Note   CSN: [id number] Arrival date & time: 10/20/20  0844     History Chief Complaint  Patient presents with  . Abdominal Pain    Matthew Scott is a 40 y.o. male.  HPI      Matthew Scott is a 40 y.o. male, with a history of recurrent abdominal pain, cholecystectomy, gastritis, gastroparesis, presenting to the ED with abdominal pain beginning this morning while having a bowel movement.  He indicates his pain is in the right upper quadrant, sharp, waxing and waning, sometimes radiating to the right lower quadrant or epigastric region.  Accompanied by nausea and an episode of nonbloody nonbilious emesis.  He had an instance of loose stool this morning. He acknowledges that he has been told he needs further assessment by gastroenterology for his recurrent abdominal pain.  He has also been told his symptoms may be due to [facility] hyperemesis.  Patient does continue to use THC products, however, he states they improve his symptoms. Denies alcohol or NSAID use. He states he was supposed to be in court this morning, but was unable to go due to his pain. Denies fever/chills, constipation, chest pain, shortness of breath, syncope, hematochezia/melena, hematuria, dysuria, or any other complaints.  Past Medical History:  Diagnosis Date  . 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 Chronic abdominal pain   . Cyclical vomiting syndrome   . GASTRITIS 12/09/2009   Qualifier: Diagnosis of  By: Daphine Deutscher FNP, Zena Amos    . Gastroparesis   . 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 Nausea and vomiting    chronic, recurrent  . Polysubstance abuse [facility])     Patient Active Problem List   Diagnosis Date Noted  . 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 Intractable  nausea and vomiting 08/04/2018  . Elevated AST (SGOT) 08/04/2018  . Uncontrollable vomiting 03/10/2016  . Anemia 03/10/2016  . Metatarsal fracture 12/22/2015  . Essential hypertension 12/22/2015  . Night sweats 01/21/2015  . GERD (gastroesophageal reflux disease) 01/21/2015  . Gastritis 12/19/2014  . Hyperbilirubinemia 12/19/2014  . Elevated bilirubin   . Nausea & vomiting 12/14/2014  . Severe tetrahydrocannabinol (THC) dependence (HCC) 12/14/2014  . Alcohol abuse 12/14/2014  . Cyclic vomiting syndrome 12/06/2014  . 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 Gastroparesis 05/11/2012  . Abdominal pain 05/06/2012  . Polysubstance abuse (HCC) 05/06/2012  . CHOLECYSTECTOMY, HX OF 11/25/2009    Past Surgical History:  Procedure Laterality Date  . CHOLECYSTECTOMY  11/2009       Family History  Problem Relation Age of Onset  . Other Father   . Diabetes Father   . Hypertension Father     Social History   Tobacco Use  . Smoking status: Current Some Day Smoker    Years: 8.00    Types: Cigars    Last attempt to quit: 12/15/2014    Years since quitting: 5.8  . Smokeless tobacco: Never Used  . Tobacco comment: 7 cigars infused with marijuana a day  Vaping Use  . Vaping Use: Never used  Substance Use Topics  . Alcohol use: No    Alcohol/week: 0.0 standard drinks  . Drug use: Yes    Types: Marijuana    Comment: Last used: 4 days ago  per patient on 04/30/2018    Home Medications Prior to Admission medications   Medication Sig Start Date End Date Taking? Authorizing Provider  ondansetron (ZOFRAN ODT) 8 MG disintegrating tablet Take 1 tablet (8 mg total) by mouth every 8 (eight) hours as needed for nausea or vomiting. 10/20/20  Yes Kasean Denherder C, PA-C  cyclobenzaprine (FLEXERIL) 10 MG tablet Take 1 tablet (10 mg total) by mouth 2 (two) times daily as needed for muscle spasms. 06/12/20   Sabas Sous, MD  dicyclomine (BENTYL) 20 MG tablet Take  1 tablet (20 mg total) by mouth daily. 09/30/20   Arthor Captain, PA-C  ibuprofen (ADVIL) 600 MG tablet Take 1 tablet (600 mg total) by mouth every 6 (six) hours as needed. 01/29/20   Fayrene Helper, PA-C  methocarbamol (ROBAXIN) 500 MG tablet Take 1 tablet (500 mg total) by mouth 2 (two) times daily. 06/07/20   Maxwell Caul, PA-C  pantoprazole (PROTONIX) 20 MG tablet Take 1 tablet (20 mg total) by mouth daily. 09/25/20 10/25/20  Gailen Shelter, PA  promethazine (PHENERGAN) 50 MG tablet Take 1 tablet (50 mg total) by mouth every 6 (six) hours as needed for nausea or vomiting. 05/11/20   Liberty Handy, PA-C  sucralfate (CARAFATE) 1 g tablet Take 1 tablet (1 g total) by mouth in the morning, at noon, and at bedtime. 09/25/20   Gailen Shelter, PA    Allergies    Patient has no known allergies.  Review of Systems   Review of Systems  Constitutional: Negative for chills, diaphoresis and fever.  Respiratory: Negative for cough and shortness of breath.   Cardiovascular: Negative for chest pain.  Gastrointestinal: Positive for abdominal pain, diarrhea, nausea and vomiting. Negative for blood in stool.  Genitourinary: Negative for dysuria, flank pain and hematuria.  Musculoskeletal: Negative for back pain.  Neurological: Negative for dizziness, syncope and weakness.  All other systems reviewed and are negative.   Physical Exam Updated Vital Signs BP 129/80   Temp 98 F (36.7 C) (Oral)   Resp 16   Ht 5\' 7"  (1.702 m)   Wt 76.7 kg   BMI 26.47 kg/m   Physical Exam Vitals and nursing note reviewed.  Constitutional:      General: He is not in acute distress.    Appearance: He is well-developed. He is not diaphoretic.  HENT:     Head: Normocephalic and atraumatic.     Mouth/Throat:     Mouth: Mucous membranes are moist.     Pharynx: Oropharynx is clear.  Eyes:     Conjunctiva/sclera: Conjunctivae normal.  Cardiovascular:     Rate and Rhythm: Normal rate and regular rhythm.      Pulses: Normal pulses.  Pulmonary:     Effort: Pulmonary effort is normal. No respiratory distress.     Breath sounds: Normal breath sounds.  Abdominal:     Palpations: Abdomen is soft.     Tenderness: There is abdominal tenderness in the right upper quadrant. There is no right CVA tenderness, left CVA tenderness or guarding.  Musculoskeletal:     Cervical back: Neck supple.  Skin:    General: Skin is warm and dry.  Neurological:     Mental Status: He is alert.  Psychiatric:        Mood and Affect: Mood and affect normal.        Speech: Speech normal.        Behavior: Behavior normal.     ED Results /  Procedures / Treatments   Labs (all labs ordered are listed, but only abnormal results are displayed) Labs Reviewed  COMPREHENSIVE METABOLIC PANEL - Abnormal; Notable for the following components:      Result Value   Potassium 3.4 (*)    Total Protein 6.3 (*)    All other components within normal limits  CBC WITH DIFFERENTIAL/PLATELET - Abnormal; Notable for the following components:   RBC 3.74 (*)    Hemoglobin 12.3 (*)    HCT 37.6 (*)    MCV 100.5 (*)    All other components within normal limits  LIPASE, BLOOD  URINALYSIS, ROUTINE W REFLEX MICROSCOPIC   Hemoglobin  Date Value Ref Range Status  10/20/2020 12.3 (L) 13.0 - 17.0 g/dL Final  37/90/2409 73.5 (L) 13.0 - 17.0 g/dL Final  32/99/2426 83.4 (L) 13.0 - 17.0 g/dL Final  19/62/2297 98.9 (L) 13.0 - 17.0 g/dL Final    EKG None  Radiology No results found.  Procedures Procedures   Medications Ordered in ED Medications  ondansetron (ZOFRAN) injection 4 mg (4 mg Intravenous Given 10/20/20 0951)  morphine 4 MG/ML injection 4 mg (4 mg Intravenous Given 10/20/20 0948)  lactated ringers bolus 1,000 mL (1,000 mLs Intravenous New Bag/Given 10/20/20 2119)    ED Course  I have reviewed the triage vital signs and the nursing notes.  Pertinent labs & imaging results that were available during my care of the patient were  reviewed by me and considered in my medical decision making (see chart for details).    MDM Rules/Calculators/A&P                          Patient presents with recurrence of his upper abdominal pain along with nausea and vomiting.  Consistent with previous instances of the symptoms. Patient is nontoxic appearing, afebrile, not tachycardic, not tachypneic, not hypotensive, maintains excellent SPO2 on room air, and is in no apparent distress.   I have reviewed the patient's chart to obtain more information.   I reviewed and interpreted the patient's labs. Very mild hypokalemia, will be addressed with dietary changes at home. No leukocytosis.  No lipase elevation or transaminase abnormality. Repeat abdominal exam benign. While here in the ED, it seems as though patient brought fast food breakfast with drink.  He was able to tolerate this after our treatment regimen here.  Patient was again advised to follow-up with PCP versus GI on this matter. The patient was given instructions for home care as well as return precautions. Patient voices understanding of these instructions, accepts the plan, and is comfortable with discharge.  Vitals:   10/20/20 0851 10/20/20 0852 10/20/20 0947  BP: 129/80  118/74  Pulse:   73  Resp: 16  20  Temp: 98 F (36.7 C)    TempSrc: Oral    SpO2:   100%  Weight:  76.7 kg   Height:  5\' 7"  (1.702 m)      Final Clinical Impression(s) / ED Diagnoses Final diagnoses:  Right upper quadrant abdominal pain    Rx / DC Orders ED Discharge Orders         Ordered    ondansetron (ZOFRAN ODT) 8 MG disintegrating tablet  Every 8 hours PRN        10/20/20 1234           12/20/20, PA-C 10/20/20 1239    12/20/20, MD 10/20/20 1704

## 2020-10-20 NOTE — ED Notes (Signed)
Pt unable to provide urine at this time, urinal at bedside.

## 2020-10-20 NOTE — Discharge Instructions (Signed)
Nausea, Vomiting, and Diarrhea  Hand washing: Wash your hands throughout the day, but especially before and after touching the face, using the restroom, sneezing, coughing, or touching surfaces that have been coughed or sneezed upon. Hydration: Symptoms will be intensified and complicated by dehydration. Dehydration can also extend the duration of symptoms. Drink plenty of fluids and get plenty of rest. You should be drinking at least half a liter of water an hour to stay hydrated. Electrolyte drinks (ex. Gatorade, Powerade, Pedialyte) are also encouraged. You should be drinking enough fluids to make your urine light yellow, almost clear. If this is not the case, you are not drinking enough water. Please note that some of the treatments indicated below will not be effective if you are not adequately hydrated. Diet: Please concentrate on hydration, however, you may introduce food slowly.  Start with a clear liquid diet, progressed to a full liquid diet, and then bland solids as you are able. Pain or fever: Ibuprofen, Naproxen, or Tylenol for pain or fever.  Nausea/vomiting: Use the ondansetron (generic for Zofran) for nausea or vomiting.  This medication may not prevent all vomiting or nausea, but can help facilitate better hydration. Things that can help with nausea/vomiting also include peppermint/menthol candies, vitamin B12, and ginger. Diarrhea: May use medications such as loperamide (Imodium) or Bismuth subsalicylate (Pepto-Bismol). Dicyclomine (previously prescribed): Dicyclomine (generic for Bentyl) is what is known as an antispasmodic and is intended to help reduce abdominal discomfort. Follow-up: Follow-up with a primary care provider or gastroenterologist on this matter. Return: Return should you develop a fever, bloody diarrhea, increased abdominal pain, uncontrolled vomiting, or any other major concerns.  For prescription assistance, may try using prescription discount sites or apps, such  as goodrx.com  Your potassium was mildly decreased from a normal level.  Please refer to the attached dietary recommendations to increase this lab result.  Follow-up with a primary care provider for repeat testing.

## 2020-10-20 NOTE — ED Triage Notes (Signed)
BIB EMS for abdominal pain. Pt had BM and started having abdominal ache and tenderness in RUQ and RLQ.Hx of same.Noncompliant to meds due to financial issues.

## 2020-10-26 ENCOUNTER — Other Ambulatory Visit: Payer: Self-pay | Admitting: Emergency Medicine

## 2020-10-26 ENCOUNTER — Emergency Department (HOSPITAL_COMMUNITY)
Admission: EM | Admit: 2020-10-26 | Discharge: 2020-10-26 | Disposition: A | Discharge status: Home / Self Care | Payer: Self-pay | Attending: Emergency Medicine | Admitting: Emergency Medicine

## 2020-10-26 ENCOUNTER — Other Ambulatory Visit: Payer: Self-pay

## 2020-10-26 ENCOUNTER — Encounter (HOSPITAL_COMMUNITY): Payer: Self-pay | Admitting: Emergency Medicine

## 2020-10-26 DIAGNOSIS — K219 Gastro-esophageal reflux disease without esophagitis: Secondary | ICD-10-CM | POA: Insufficient documentation

## 2020-10-26 DIAGNOSIS — R1011 Right upper quadrant pain: Secondary | ICD-10-CM | POA: Insufficient documentation

## 2020-10-26 DIAGNOSIS — F17290 Nicotine dependence, other tobacco product, uncomplicated: Secondary | ICD-10-CM | POA: Insufficient documentation

## 2020-10-26 DIAGNOSIS — R1115 Cyclical vomiting syndrome unrelated to migraine: Secondary | ICD-10-CM

## 2020-10-26 DIAGNOSIS — K2970 Gastritis, unspecified, without bleeding: Secondary | ICD-10-CM

## 2020-10-26 DIAGNOSIS — G8929 Other chronic pain: Secondary | ICD-10-CM | POA: Insufficient documentation

## 2020-10-26 DIAGNOSIS — I10 Essential (primary) hypertension: Secondary | ICD-10-CM | POA: Insufficient documentation

## 2020-10-26 LAB — COMPREHENSIVE METABOLIC PANEL
ALT: 18 U/L (ref 0–44)
AST: 17 U/L (ref 15–41)
Albumin: 3.5 g/dL (ref 3.5–5.0)
Alkaline Phosphatase: 59 U/L (ref 38–126)
Anion gap: 5 (ref 5–15)
BUN: 10 mg/dL (ref 6–20)
CO2: 29 mmol/L (ref 22–32)
Calcium: 9.2 mg/dL (ref 8.9–10.3)
Chloride: 109 mmol/L (ref 98–111)
Creatinine, Ser: 0.99 mg/dL (ref 0.61–1.24)
GFR, Estimated: >60 mL/min (ref >60)
Glucose, Bld: 97 mg/dL (ref 70–99)
Potassium: 4 mmol/L (ref 3.5–5.1)
Sodium: 143 mmol/L (ref 135–145)
Total Bilirubin: 1 mg/dL (ref 0.3–1.2)
Total Protein: 6.3 g/dL — ABNORMAL LOW (ref 6.5–8.1)

## 2020-10-26 LAB — CBC
HCT: 38.1 % — ABNORMAL LOW (ref 39.0–52.0)
Hemoglobin: 12.3 g/dL — ABNORMAL LOW (ref 13.0–17.0)
MCH: 32.7 pg (ref 26.0–34.0)
MCHC: 32.3 g/dL (ref 30.0–36.0)
MCV: 101.3 fL — ABNORMAL HIGH (ref 80.0–100.0)
Platelets: 212 10*3/uL (ref 150–400)
RBC: 3.76 MIL/uL — ABNORMAL LOW (ref 4.22–5.81)
RDW: 12.1 % (ref 11.5–15.5)
WBC: 4.7 10*3/uL (ref 4.0–10.5)
nRBC: 0 % (ref 0.0–0.2)

## 2020-10-26 LAB — LIPASE, BLOOD: Lipase: 28 U/L (ref 11–51)

## 2020-10-26 MED ORDER — DICYCLOMINE HCL 20 MG PO TABS: 20.0000 mg | ORAL_TABLET | Freq: Every day | ORAL | 0 refills | Status: DC

## 2020-10-26 MED ORDER — ONDANSETRON 4 MG PO TBDP
4.0000 mg | ORAL_TABLET | Freq: Once | ORAL | Status: AC
  Administered 2020-10-26: 4 mg via ORAL
  Filled 2020-10-26: qty 1

## 2020-10-26 MED ORDER — SUCRALFATE 1 G PO TABS: 1.0000 g | ORAL_TABLET | Freq: Three times a day (TID) | ORAL | 0 refills | Status: DC

## 2020-10-26 MED ORDER — DICYCLOMINE HCL 10 MG PO CAPS
10.0000 mg | ORAL_CAPSULE | Freq: Once | ORAL | Status: AC
  Administered 2020-10-26: 10 mg via ORAL
  Filled 2020-10-26: qty 1

## 2020-10-26 NOTE — ED Triage Notes (Signed)
Pt BIB [facility] from home. Complaint of recurrent abdominal pain. Patient states his abdomen always hurts but today was worse. VSS. NAD.

## 2020-10-26 NOTE — Care Management (Signed)
  [facility] Medication Assistance Card Name: [place] ID (MRN): [id number] Bin: [id number] RX Group: [id number] Discharge Date: 10/26/2020 Expiration Date: 11/05/2020                                           (must be filled within 7 days of discharge)     Dear   : Ned Clines You have been approved to have the prescriptions written by your discharging physician filled through our [facility] (Medication Assistance Through [facility]) program. This program allows for a one-time (no refills) 34-day supply of selected medications for a low copay amount.  The copay is $3.00 per prescription. For instance, if you have one prescription, you will pay $3.00; for two prescriptions, you pay $6.00; for three prescriptions, you pay $9.00; and so on.  Only certain pharmacies are participating in this program with [facility]. You will need to select one of the pharmacies from the attached list and take your prescriptions, this letter, and your photo ID to one of the participating pharmacies.   We are excited that you are able to use the [facility] program to get your medications. These prescriptions must be filled within 7 days of hospital discharge or they will no longer be valid for the [facility] program. Should you have any problems with your prescriptions please contact your case management team member at [phone] for [place]/ [facility].  Thank you, [facility] Care Management

## 2020-10-26 NOTE — ED Provider Notes (Signed)
[facility] EMERGENCY DEPARTMENT Provider Note   CSN: [id number] Arrival date & time: 10/26/20  1317  History Chief Complaint  Patient presents with  . Abdominal Pain    Matthew Scott is a 40 y.o. male.  Patient has a history of recurrent abdominal pain, cholecystectomy, gastritis, gastroparesis, THC use. He is presenting to the ED with abdominal pain.  Patient reports that he has daily abdominal pain.  He has been seen here previously for this abdominal pain.  He states that his abdominal pain is in the right upper quadrant.  He states that today it was worse than usual so he decided to come to the emergency room.  Patient reports that his pain has improved since onset.  He states that most every day it is worse in the morning than it is in the afternoon.  He states that currently his pain has much improved since onset.  He denies any vomiting but does report nausea.  Denies any constipation but states that some of his stools have been loose over the last few days.  He denies any alcohol use but does report THC use.  He does report chronic abdominal pain, but has not followed up with GI as he states he has no insurance and no way to do so.  He has not followed with his primary doctor in a while either.  Patient states that while he is prescribed medications for abdominal pain, he is unable to fill them as he cannot afford his medications.  Patient denies any dysuria or hematuria.  Patient denies any flank pain.  Patient has not had any blood in the stool.  Patient denies any vomiting.        Past Medical History:  Diagnosis Date  . 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 Chronic abdominal pain   . Cyclical vomiting syndrome   . GASTRITIS 12/09/2009   Qualifier: Diagnosis of  By: Daphine Deutscher FNP, Zena Amos    . Gastroparesis   . 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 Nausea  and vomiting    chronic, recurrent  . Polysubstance abuse [facility])     Patient Active Problem List   Diagnosis Date Noted  . 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 Intractable nausea and vomiting 08/04/2018  . Elevated AST (SGOT) 08/04/2018  . Uncontrollable vomiting 03/10/2016  . Anemia 03/10/2016  . Metatarsal fracture 12/22/2015  . Essential hypertension 12/22/2015  . Night sweats 01/21/2015  . GERD (gastroesophageal reflux disease) 01/21/2015  . Gastritis 12/19/2014  . Hyperbilirubinemia 12/19/2014  . Elevated bilirubin   . Nausea & vomiting 12/14/2014  . Severe tetrahydrocannabinol (THC) dependence (HCC) 12/14/2014  . Alcohol abuse 12/14/2014  . Cyclic vomiting syndrome 12/06/2014  . 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 Gastroparesis 05/11/2012  . Abdominal pain 05/06/2012  . Polysubstance abuse (HCC) 05/06/2012  . CHOLECYSTECTOMY, HX OF 11/25/2009    Past Surgical History:  Procedure Laterality Date  . CHOLECYSTECTOMY  11/2009       Family History  Problem Relation Age of Onset  . Other Father   . Diabetes Father   . Hypertension Father     Social History   Tobacco Use  . Smoking status: Current Some Day Smoker    Years: 8.00    Types: Cigars  Last attempt to quit: 12/15/2014    Years since quitting: 5.8  . Smokeless tobacco: Never Used  . Tobacco comment: 7 cigars infused with marijuana a day  Vaping Use  . Vaping Use: Never used  Substance Use Topics  . Alcohol use: No    Alcohol/week: 0.0 standard drinks  . Drug use: Yes    Types: Marijuana    Comment: Last used: 4 days ago per patient on 04/30/2018    Home Medications Prior to Admission medications   Medication Sig Start Date End Date Taking? Authorizing Provider  cyclobenzaprine (FLEXERIL) 10 MG tablet Take 1 tablet (10 mg total) by mouth 2 (two) times daily as needed  for muscle spasms. 06/12/20   Sabas Sous, MD  dicyclomine (BENTYL) 20 MG tablet Take 1 tablet (20 mg total) by mouth daily. 10/26/20   Rajon Bisig, [place], MD  ibuprofen (ADVIL) 600 MG tablet Take 1 tablet (600 mg total) by mouth every 6 (six) hours as needed. 01/29/20   Fayrene Helper, PA-C  methocarbamol (ROBAXIN) 500 MG tablet Take 1 tablet (500 mg total) by mouth 2 (two) times daily. 06/07/20   Maxwell Caul, PA-C  ondansetron (ZOFRAN ODT) 8 MG disintegrating tablet Take 1 tablet (8 mg total) by mouth every 8 (eight) hours as needed for nausea or vomiting. 10/20/20   Joy, Shawn C, PA-C  pantoprazole (PROTONIX) 20 MG tablet Take 1 tablet (20 mg total) by mouth daily. 09/25/20 10/25/20  Gailen Shelter, PA  promethazine (PHENERGAN) 50 MG tablet Take 1 tablet (50 mg total) by mouth every 6 (six) hours as needed for nausea or vomiting. 05/11/20   Liberty Handy, PA-C  sucralfate (CARAFATE) 1 g tablet Take 1 tablet (1 g total) by mouth in the morning, at noon, and at bedtime. 10/26/20   Anniebelle Devore, [place], MD    Allergies    Patient has no known allergies.  Review of Systems   Review of Systems  Constitutional: Negative for chills and fever.  HENT: Negative for ear pain and sore throat.   Eyes: Negative for pain and visual disturbance.  Respiratory: Negative for cough and shortness of breath.   Cardiovascular: Negative for chest pain and palpitations.  Gastrointestinal: Positive for abdominal pain and diarrhea. Negative for constipation and vomiting.  Genitourinary: Negative for dysuria and hematuria.  Musculoskeletal: Negative for arthralgias and back pain.  Skin: Negative for color change and rash.  Neurological: Negative for seizures and syncope.  All other systems reviewed and are negative.   Physical Exam Updated Vital Signs BP (!) 139/91 (BP Location: Right Arm)   Pulse (!) 56   Temp 98 F (36.7 C) (Oral)   Resp 16   SpO2 100%   Physical Exam Vitals and nursing note reviewed.   Constitutional:      Appearance: He is well-developed.  HENT:     Head: Normocephalic and atraumatic.  Eyes:     Conjunctiva/sclera: Conjunctivae normal.  Cardiovascular:     Rate and Rhythm: Normal rate and regular rhythm.  Pulmonary:     Effort: Pulmonary effort is normal.     Breath sounds: Normal breath sounds.  Abdominal:     Palpations: Abdomen is soft.     Tenderness: There is no abdominal tenderness. There is no right CVA tenderness, left CVA tenderness, guarding or rebound.  Musculoskeletal:     Cervical back: Neck supple.  Skin:    General: Skin is warm and dry.  Neurological:     General: No focal  deficit present.     Mental Status: He is alert and oriented to person, place, and time.    ED Results / Procedures / Treatments   Labs (all labs ordered are listed, but only abnormal results are displayed) Labs Reviewed  COMPREHENSIVE METABOLIC PANEL - Abnormal; Notable for the following components:      Result Value   Total Protein 6.3 (*)    All other components within normal limits  CBC - Abnormal; Notable for the following components:   RBC 3.76 (*)    Hemoglobin 12.3 (*)    HCT 38.1 (*)    MCV 101.3 (*)    All other components within normal limits  LIPASE, BLOOD    EKG None  Radiology No results found.  Procedures Procedures   Medications Ordered in ED Medications  dicyclomine (BENTYL) capsule 10 mg (10 mg Oral Given 10/26/20 1809)  ondansetron (ZOFRAN-ODT) disintegrating tablet 4 mg (4 mg Oral Given 10/26/20 1809)   ED Course  I have reviewed the triage vital signs and the nursing notes.  Pertinent labs & imaging results that were available during my care of the patient were reviewed by me and considered in my medical decision making (see chart for details).    MDM Rules/Calculators/A&P                         40 year old male with a history of chronic abdominal pain who is been seen in the emergency department for similar multiple times over  the past few months.  Has had multiple CT scans and ultrasound performed, most recently on 16 February showing no acute findings in the abdomen or pelvis.  Patient reports that his pain has improved since onset.  He states that he has been able to eat some [employer] since the onset of this pain this morning.  He reports that his pain is similar to previous pain.  He states that he has been dealing with the same pain every day for the past 2 years.  He reports that he has not been taking his medication.  He states that he cannot afford them.  Case manager consult placed in order to help patient obtain his medications.  Additionally, patient reports that he is supposed to be following with GI as an outpatient.  Given patient with is without insurance currently, we will place a referral to the community and health outreach clinic, he can obtain further referral from there.  Without any significant abdominal tenderness, guarding, or rebound today.  He is well-appearing.  He is tolerating p.o.  And he does not appear in any acute distress.  Labs as above and without any significant findings.  Normal lipase.  Hemoglobin stable.  Provided patient with a dose of Bentyl here and will provide a dose of Zofran.  New prescriptions of Bentyl and Carafate sent to the pharmacy.  Overall patient is well-appearing, without abdominal tenderness, and I have a low suspicion for acute intra-abdominal process.  He is tolerating p.o. and is not in any acute distress.  He is stable for discharge home at this time.  Instructed patient to pick up his medications with resources provided to him by social work.  He will follow up with the community clinic.   Final Clinical Impression(s) / ED Diagnoses Final diagnoses:  Right upper quadrant abdominal pain    Rx / DC Orders ED Discharge Orders         Ordered    dicyclomine (  BENTYL) 20 MG tablet  Daily        10/26/20 1846    sucralfate (CARAFATE) 1 g tablet  3 times daily         10/26/20 1847           Karina Nofsinger, [place], MD 10/26/20 2351    Tilden Fossa, MD 10/28/20 [phone]

## 2020-11-26 ENCOUNTER — Other Ambulatory Visit: Payer: Self-pay

## 2020-11-26 ENCOUNTER — Ambulatory Visit: Payer: Self-pay | Attending: Critical Care Medicine | Admitting: Critical Care Medicine

## 2020-11-26 ENCOUNTER — Encounter: Payer: Self-pay | Admitting: Critical Care Medicine

## 2020-11-26 VITALS — BP 157/73 | HR 69 | Ht 67.0 in | Wt 171.2 lb

## 2020-11-26 DIAGNOSIS — R1115 Cyclical vomiting syndrome unrelated to migraine: Secondary | ICD-10-CM

## 2020-11-26 DIAGNOSIS — Z9049 Acquired absence of other specified parts of digestive tract: Secondary | ICD-10-CM

## 2020-11-26 DIAGNOSIS — R110 Nausea: Secondary | ICD-10-CM

## 2020-11-26 DIAGNOSIS — F1010 Alcohol abuse, uncomplicated: Secondary | ICD-10-CM

## 2020-11-26 DIAGNOSIS — R1011 Right upper quadrant pain: Secondary | ICD-10-CM

## 2020-11-26 MED ORDER — TRAMADOL HCL 50 MG PO TABS
50.0000 mg | ORAL_TABLET | Freq: Two times a day (BID) | ORAL | 0 refills | Status: AC | PRN
Start: 2020-11-26 — End: 2020-12-06
  Filled 2020-11-26: qty 20, 10d supply, fill #0

## 2020-11-26 MED ORDER — ONDANSETRON 4 MG PO TBDP
4.0000 mg | ORAL_TABLET | Freq: Once | ORAL | Status: DC
Start: 2020-11-26 — End: 2020-12-09

## 2020-11-26 MED ORDER — ONDANSETRON 8 MG PO TBDP
8.0000 mg | ORAL_TABLET | Freq: Three times a day (TID) | ORAL | 0 refills | Status: DC | PRN
  Filled 2020-11-26: qty 20, 7d supply, fill #0

## 2020-11-26 MED ORDER — SUCRALFATE 1 G PO TABS
ORAL_TABLET | ORAL | 0 refills | Status: DC
  Filled 2020-11-26: qty 90, 30d supply, fill #0

## 2020-11-26 MED ORDER — DICYCLOMINE HCL 20 MG PO TABS
20.0000 mg | ORAL_TABLET | Freq: Every day | ORAL | 0 refills | Status: DC
  Filled 2020-11-26: qty 30, 30d supply, fill #0

## 2020-11-26 NOTE — Progress Notes (Signed)
Subjective:    Patient ID: Matthew Scott, male    DOB: 10-16-1980, 40 y.o.   MRN: [id number]  39 y.o.M here to est pcp  Acute abdominal pain  11/26/2020 This patient is seen to establish primary care.  He has been to the emergency room on multiple occasions for chronic recurrent right upper quadrant abdominal pain.  Most recent visit was in March as documented below.  ED 10/2020                       40 year old male with a history of chronic abdominal pain who is been seen in the emergency department for similar multiple times over the past few months.  Has had multiple CT scans and ultrasound performed, most recently on 16 February showing no acute findings in the abdomen or pelvis.  Patient reports that his pain has improved since onset.  He states that he has been able to eat some [employer] since the onset of this pain this morning.  He reports that his pain is similar to previous pain.  He states that he has been dealing with the same pain every day for the past 2 years.  He reports that he has not been taking his medication.  He states that he cannot afford them.  Case manager consult placed in order to help patient obtain his medications.  Additionally, patient reports that he is supposed to be following with GI as an outpatient.  Given patient with is without insurance currently, we will place a referral to the community and health outreach clinic, he can obtain further referral from there.  Without any significant abdominal tenderness, guarding, or rebound today.  He is well-appearing.  He is tolerating p.o.  And he does not appear in any acute distress.  Labs as above and without any significant findings.  Normal lipase.  Hemoglobin stable.  Provided patient with a dose of Bentyl here and will provide a dose of Zofran.  New prescriptions of Bentyl and Carafate sent to the pharmacy.  Overall patient is well-appearing, without abdominal tenderness, and I have a low suspicion for acute  intra-abdominal process.  He is tolerating p.o. and is not in any acute distress.  He is stable for discharge home at this time.  Instructed patient to pick up his medications with resources provided to him by social work.  He will follow up with the community clinic.   Patient states when he takes Zofran and Bentyl it does relieve the pain.  He says the pain is sharp stabbing worse when getting up in the morning he actually is improved if he lays flat he states a hot shower helps he has nausea and vomiting the emesis is yellow there is no blood in the stool or in the emesis.  The pain is sharp and dull and comes on and then remits.  He had a cholecystectomy 8 years ago and this pain started after that.  He is never been diagnosed with any sphincter problems in his common bile duct or otherwise.  He has very tiny renal stones in his right kidney that have not changed been imaged twice once in October once in March.  Patient was in tears laying flat on the exam table when I came into the room.  We gave him a dose of Zofran he calm down.  He is interested in further follow-up but he does not have any insurance.  When trying to apply for the [facility]  health discount he is not able to follow through as he ends up in the emergency room before he can make his appointments with a financial counselor  The patient denies any alcohol use.  Past Medical History:  Diagnosis Date  . 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 Chronic abdominal pain   . Cyclical vomiting syndrome   . GASTRITIS 12/09/2009   Qualifier: Diagnosis of  By: Hassell Done FNP, Tori Milks    . Gastroparesis   . HELICOBACTER PYLORI INFECTION 12/14/2009   Qualifier: Diagnosis of  By: Isla Pence    . Nausea and vomiting    chronic, recurrent  . Polysubstance abuse ([facility])      Family History  Problem Relation Age of Onset  . Other Father   . Diabetes Father   . Hypertension Father      Social  History   Socioeconomic History  . Marital status: Single    Spouse name: Not on file  . Number of children: Not on file  . Years of education: Not on file  . Highest education level: Not on file  Occupational History  . Occupation: Unemployed, helps people move  Tobacco Use  . Smoking status: Current Some Day Smoker    Years: 8.00    Types: Cigars    Last attempt to quit: 12/15/2014    Years since quitting: 5.9  . Smokeless tobacco: Never Used  . Tobacco comment: 7 cigars infused with marijuana a day  Vaping Use  . Vaping Use: Never used  Substance and Sexual Activity  . Alcohol use: No    Alcohol/week: 0.0 standard drinks  . Drug use: Yes    Types: Marijuana    Comment: Last used: 4 days ago per patient on 04/30/2018  . Sexual activity: Yes  Other Topics Concern  . Not on file  Social History Narrative   Single, lives with parents.  Ambulatory.   Social Determinants of Health   Financial Resource Strain: Not on file  Food Insecurity: No Food Insecurity  . Worried About Charity fundraiser in the Last Year: Never true  . Ran Out of Food in the Last Year: Never true  Transportation Needs: Unmet Transportation Needs  . Lack of Transportation (Medical): Yes  . Lack of Transportation (Non-Medical): Yes  Physical Activity: Not on file  Stress: Not on file  Social Connections: Not on file  Intimate Partner Violence: Not on file     No Known Allergies   Outpatient Medications Prior to Visit  Medication Sig Dispense Refill  . cyclobenzaprine (FLEXERIL) 10 MG tablet Take 1 tablet (10 mg total) by mouth 2 (two) times daily as needed for muscle spasms. 20 tablet 0  . dicyclomine (BENTYL) 20 MG tablet Take 1 tablet (20 mg total) by mouth daily. (Patient not taking: Reported on 11/26/2020) 30 tablet 0  . dicyclomine (BENTYL) 20 MG tablet TAKE 1 TABLET (20 MG TOTAL) BY MOUTH DAILY. (Patient not taking: Reported on 11/26/2020) 30 tablet 0  . dicyclomine (BENTYL) 20 MG tablet TAKE 1  TABLET (20 MG TOTAL) BY MOUTH DAILY. (Patient not taking: Reported on 11/26/2020) 30 tablet 0  . ibuprofen (ADVIL) 600 MG tablet Take 1 tablet (600 mg total) by mouth every 6 (six) hours as needed. (Patient not taking: Reported on 11/26/2020) 30 tablet 0  . methocarbamol (ROBAXIN) 500 MG tablet Take 1 tablet (500 mg total) by mouth 2 (two) times daily. (Patient not taking:  Reported on 11/26/2020) 20 tablet 0  . ondansetron (ZOFRAN ODT) 8 MG disintegrating tablet Take 1 tablet (8 mg total) by mouth every 8 (eight) hours as needed for nausea or vomiting. (Patient not taking: Reported on 11/26/2020) 20 tablet 0  . ondansetron (ZOFRAN-ODT) 8 MG disintegrating tablet TAKE 1 TABLET (8 MG TOTAL) BY MOUTH EVERY 8 (EIGHT) HOURS AS NEEDED FOR NAUSEA OR VOMITING. (Patient not taking: Reported on 11/26/2020) 20 tablet 0  . pantoprazole (PROTONIX) 20 MG tablet Take 1 tablet (20 mg total) by mouth daily. 30 tablet 0  . pantoprazole (PROTONIX) 20 MG tablet TAKE 1 TABLET (20 MG TOTAL) BY MOUTH DAILY. (Patient not taking: Reported on 11/26/2020) 30 tablet 0  . promethazine (PHENERGAN) 50 MG tablet Take 1 tablet (50 mg total) by mouth every 6 (six) hours as needed for nausea or vomiting. (Patient not taking: Reported on 11/26/2020) 30 tablet 0  . sucralfate (CARAFATE) 1 g tablet Take 1 tablet (1 g total) by mouth in the morning, at noon, and at bedtime. (Patient not taking: Reported on 11/26/2020) 90 tablet 0  . sucralfate (CARAFATE) 1 g tablet TAKE 1 TABLET (1 G TOTAL) BY MOUTH IN THE MORNING, AT NOON, AND AT BEDTIME. (Patient not taking: Reported on 11/26/2020) 90 tablet 0  . sucralfate (CARAFATE) 1 g tablet TAKE 1 TABLET (1 G TOTAL) BY MOUTH IN THE MORNING, AT NOON, AND AT BEDTIME. (Patient not taking: Reported on 11/26/2020) 90 tablet 0   No facility-administered medications prior to visit.      Review of Systems  HENT: Negative.   Respiratory: Negative.   Cardiovascular: Negative.  Negative for palpitations.   Genitourinary: Negative.   Musculoskeletal: Positive for back pain.  Skin: Negative.   Neurological: Negative.        Objective:   Physical Exam  Vitals:   11/26/20 0931  BP: (!) 157/73  Pulse: 69  SpO2: 97%  Weight: 171 lb 3.2 oz (77.7 kg)  Height: $Remove'5\' 7"'zTrIVOJ$  (1.702 m)    Gen: Very anxious and tearful as I came into the room sweating profusely laying on the exam table   ENT: No lesions,  mouth clear,  oropharynx clear, no postnasal drip  Neck: No JVD, no TMG, no carotid bruits  Lungs: No use of accessory muscles, no dullness to percussion, clear without rales or rhonchi  Cardiovascular: RRR, heart sounds normal, no murmur or gallops, no peripheral edema  Abdomen: Abdomen is soft but there is extreme tenderness of the right upper quadrant however there is no rebound there is guarding though., no HSM,  BS normal  Musculoskeletal: No deformities, no cyanosis or clubbing  Neuro: alert, non focal  Skin: Warm, no lesions or rashes  Hepatic Function Latest Ref Rng & Units 10/26/2020 10/20/2020 09/30/2020  Total Protein 6.5 - 8.1 g/dL 6.3(L) 6.3(L) 6.8  Albumin 3.5 - 5.0 g/dL 3.5 3.6 3.9  AST 15 - 41 U/L $Remo'17 19 19  'CucKQ$ ALT 0 - 44 U/L $Remo'18 16 25  'gmoCV$ Alk Phosphatase 38 - 126 U/L 59 57 63  Total Bilirubin 0.3 - 1.2 mg/dL 1.0 1.2 1.6(H)  Bilirubin, Direct 0.0 - 0.2 mg/dL - - -   BMP Latest Ref Rng & Units 10/26/2020 10/20/2020 09/30/2020  Glucose 70 - 99 mg/dL 97 97 107(H)  BUN 6 - 20 mg/dL $Remove'10 12 10  'RZFURyL$ Creatinine 0.61 - 1.24 mg/dL 0.99 0.85 0.78  Sodium 135 - 145 mmol/L 143 139 139  Potassium 3.5 - 5.1 mmol/L 4.0 3.4(L) 3.9  Chloride  98 - 111 mmol/L 109 106 106  CO2 22 - 32 mmol/L $RemoveB'29 25 24  'BcyCdvIy$ Calcium 8.9 - 10.3 mg/dL 9.2 9.2 9.5   CBC Latest Ref Rng & Units 10/26/2020 10/20/2020 09/30/2020  WBC 4.0 - 10.5 K/uL 4.7 5.1 6.9  Hemoglobin 13.0 - 17.0 g/dL 12.3(L) 12.3(L) 12.5(L)  Hematocrit 39.0 - 52.0 % 38.1(L) 37.6(L) 39.4  Platelets 150 - 400 K/uL 212 214 245   All imaging studies in the health  link reviewed     Assessment & Plan:  I personally reviewed all images and lab data in the [facility] system as well as any outside material available during this office visit and agree with the  radiology impressions.   Abdominal pain Chronic recurrent abdominal pain has been a problem for many years he states since his cholecystectomy  No real reflux disease.  No overt evidence of gastritis.  Patient denies any current alcohol and states he only minimally uses marijuana.  Note he is had hyperemesis from excess marijuana use in the past.  We will recheck all labs and refill Carafate and Bentyl and Zofran and refer to gastroenterology and get him into the [place] discount program  Alcohol abuse Patient states he is not currently actively using alcohol   Ric was seen today for abdominal pain.  Diagnoses and all orders for this visit:  Right upper quadrant abdominal pain -     Comprehensive metabolic panel -     CBC with Differential/Platelet -     Ambulatory referral to Gastroenterology  Nausea -     ondansetron (ZOFRAN-ODT) disintegrating tablet 4 mg -     Comprehensive metabolic panel -     CBC with Differential/Platelet -     Ambulatory referral to Gastroenterology  Cyclic vomiting syndrome -     Comprehensive metabolic panel -     CBC with Differential/Platelet -     Ambulatory referral to Gastroenterology -     dicyclomine (BENTYL) 20 MG tablet; Take 1 tablet (20 mg total) by mouth daily.  S/P cholecystectomy -     Comprehensive metabolic panel -     CBC with Differential/Platelet -     Ambulatory referral to Gastroenterology  Alcohol abuse  Other orders -     traMADol (ULTRAM) 50 MG tablet; Take 1 tablet (50 mg total) by mouth every 12 (twelve) hours as needed for up to 10 days. -     ondansetron (ZOFRAN ODT) 8 MG disintegrating tablet; Take 1 tablet (8 mg total) by mouth every 8 (eight) hours as needed for nausea or vomiting. -     sucralfate (CARAFATE) 1 g  tablet; TAKE 1 TABLET (1 G TOTAL) BY MOUTH IN THE MORNING, AT NOON, AND AT BEDTIME.

## 2020-11-26 NOTE — Assessment & Plan Note (Signed)
Chronic recurrent abdominal pain has been a problem for many years he states since his cholecystectomy  No real reflux disease.  No overt evidence of gastritis.  Patient denies any current alcohol and states he only minimally uses marijuana.  Note he is had hyperemesis from excess marijuana use in the past.  We will recheck all labs and refill Carafate and Bentyl and Zofran and refer to gastroenterology and get him into the [facility] health discount program

## 2020-11-26 NOTE — Progress Notes (Signed)
Severe abdominal pain

## 2020-11-26 NOTE — Patient Instructions (Signed)
Refill on Bentyl and Carafate sent to the pharmacy  Take tramadol twice daily as needed for severe pain  Referral to gastroenterology was made  Please obtain orange card and [place] discount application apply for this and see our financial counselor soon as possible to get the [facility] card which will assist in cost of care for your condition  Labs today will be obtained  Zofran was given x1 dose here in the office  Take your Bentyl as soon as you get it filled today  Return to see Dr. Delford Field 6 weeks for primary care follow-up  If symptoms worsen please go to the emergency room for further attention

## 2020-11-26 NOTE — Assessment & Plan Note (Signed)
Patient states he is not currently actively using alcohol

## 2020-11-27 LAB — COMPREHENSIVE METABOLIC PANEL
ALT: 15 IU/L (ref 0–44)
AST: 16 IU/L (ref 0–40)
Albumin/Globulin Ratio: 1.6 (ref 1.2–2.2)
Albumin: 4.1 g/dL (ref 4.0–5.0)
Alkaline Phosphatase: 75 IU/L (ref 44–121)
BUN/Creatinine Ratio: 11 (ref 9–20)
BUN: 10 mg/dL (ref 6–24)
Bilirubin Total: 0.7 mg/dL (ref 0.0–1.2)
CO2: 22 mmol/L (ref 20–29)
Calcium: 9.3 mg/dL (ref 8.7–10.2)
Chloride: 107 mmol/L — ABNORMAL HIGH (ref 96–106)
Creatinine, Ser: 0.94 mg/dL (ref 0.76–1.27)
Globulin, Total: 2.5 g/dL (ref 1.5–4.5)
Glucose: 102 mg/dL — ABNORMAL HIGH (ref 65–99)
Potassium: 3.6 mmol/L (ref 3.5–5.2)
Sodium: 144 mmol/L (ref 134–144)
Total Protein: 6.6 g/dL (ref 6.0–8.5)
eGFR: 104 mL/min/{1.73_m2} (ref >59)

## 2020-11-27 LAB — CBC WITH DIFFERENTIAL/PLATELET
Basophils Absolute: 0 10*3/uL (ref 0.0–0.2)
Basos: 1 %
EOS (ABSOLUTE): 0 10*3/uL (ref 0.0–0.4)
Eos: 1 %
Hematocrit: 37.5 % (ref 37.5–51.0)
Hemoglobin: 12.2 g/dL — ABNORMAL LOW (ref 13.0–17.7)
Immature Grans (Abs): 0 10*3/uL (ref 0.0–0.1)
Immature Granulocytes: 0 %
Lymphocytes Absolute: 1.6 10*3/uL (ref 0.7–3.1)
Lymphs: 39 %
MCH: 32.1 pg (ref 26.6–33.0)
MCHC: 32.5 g/dL (ref 31.5–35.7)
MCV: 99 fL — ABNORMAL HIGH (ref 79–97)
Monocytes Absolute: 0.3 10*3/uL (ref 0.1–0.9)
Monocytes: 8 %
Neutrophils Absolute: 2.1 10*3/uL (ref 1.4–7.0)
Neutrophils: 51 %
Platelets: 227 10*3/uL (ref 150–450)
RBC: 3.8 x10E6/uL — ABNORMAL LOW (ref 4.14–5.80)
RDW: 11.7 % (ref 11.6–15.4)
WBC: 4.1 10*3/uL (ref 3.4–10.8)

## 2020-12-09 ENCOUNTER — Encounter (HOSPITAL_COMMUNITY): Payer: Self-pay | Admitting: Emergency Medicine

## 2020-12-09 ENCOUNTER — Emergency Department (HOSPITAL_COMMUNITY)
Admission: EM | Admit: 2020-12-09 | Discharge: 2020-12-09 | Disposition: A | Discharge status: Home / Self Care | Payer: Self-pay | Attending: Emergency Medicine | Admitting: Emergency Medicine

## 2020-12-09 DIAGNOSIS — K219 Gastro-esophageal reflux disease without esophagitis: Secondary | ICD-10-CM | POA: Insufficient documentation

## 2020-12-09 DIAGNOSIS — R109 Unspecified abdominal pain: Secondary | ICD-10-CM

## 2020-12-09 DIAGNOSIS — F17290 Nicotine dependence, other tobacco product, uncomplicated: Secondary | ICD-10-CM | POA: Insufficient documentation

## 2020-12-09 DIAGNOSIS — I10 Essential (primary) hypertension: Secondary | ICD-10-CM | POA: Insufficient documentation

## 2020-12-09 DIAGNOSIS — R1011 Right upper quadrant pain: Secondary | ICD-10-CM | POA: Insufficient documentation

## 2020-12-09 LAB — CBC WITH DIFFERENTIAL/PLATELET
Abs Immature Granulocytes: 0.04 10*3/uL (ref 0.00–0.07)
Basophils Absolute: 0 10*3/uL (ref 0.0–0.1)
Basophils Relative: 0 %
Eosinophils Absolute: 0 10*3/uL (ref 0.0–0.5)
Eosinophils Relative: 0 %
HCT: 38.3 % — ABNORMAL LOW (ref 39.0–52.0)
Hemoglobin: 12.3 g/dL — ABNORMAL LOW (ref 13.0–17.0)
Immature Granulocytes: 0 %
Lymphocytes Relative: 14 %
Lymphs Abs: 1.8 10*3/uL (ref 0.7–4.0)
MCH: 32.5 pg (ref 26.0–34.0)
MCHC: 32.1 g/dL (ref 30.0–36.0)
MCV: 101.3 fL — ABNORMAL HIGH (ref 80.0–100.0)
Monocytes Absolute: 0.5 10*3/uL (ref 0.1–1.0)
Monocytes Relative: 4 %
Neutro Abs: 10 10*3/uL — ABNORMAL HIGH (ref 1.7–7.7)
Neutrophils Relative %: 82 %
Platelets: 245 10*3/uL (ref 150–400)
RBC: 3.78 MIL/uL — ABNORMAL LOW (ref 4.22–5.81)
RDW: 12.4 % (ref 11.5–15.5)
WBC: 12.4 10*3/uL — ABNORMAL HIGH (ref 4.0–10.5)
nRBC: 0 % (ref 0.0–0.2)

## 2020-12-09 LAB — COMPREHENSIVE METABOLIC PANEL
ALT: 22 U/L (ref 0–44)
AST: 26 U/L (ref 15–41)
Albumin: 4 g/dL (ref 3.5–5.0)
Alkaline Phosphatase: 67 U/L (ref 38–126)
Anion gap: 6 (ref 5–15)
BUN: 8 mg/dL (ref 6–20)
CO2: 28 mmol/L (ref 22–32)
Calcium: 9.5 mg/dL (ref 8.9–10.3)
Chloride: 103 mmol/L (ref 98–111)
Creatinine, Ser: 0.83 mg/dL (ref 0.61–1.24)
GFR, Estimated: >60 mL/min (ref >60)
Glucose, Bld: 112 mg/dL — ABNORMAL HIGH (ref 70–99)
Potassium: 3.8 mmol/L (ref 3.5–5.1)
Sodium: 137 mmol/L (ref 135–145)
Total Bilirubin: 1.6 mg/dL — ABNORMAL HIGH (ref 0.3–1.2)
Total Protein: 7 g/dL (ref 6.5–8.1)

## 2020-12-09 LAB — URINALYSIS, ROUTINE W REFLEX MICROSCOPIC
Bilirubin Urine: NEGATIVE
Glucose, UA: NEGATIVE mg/dL
Hgb urine dipstick: NEGATIVE
Ketones, ur: NEGATIVE mg/dL
Leukocytes,Ua: NEGATIVE
Nitrite: NEGATIVE
Protein, ur: NEGATIVE mg/dL
Specific Gravity, Urine: 1.025 (ref 1.005–1.030)
pH: 8 (ref 5.0–8.0)

## 2020-12-09 LAB — LIPASE, BLOOD: Lipase: 25 U/L (ref 11–51)

## 2020-12-09 MED ORDER — ONDANSETRON 4 MG PO TBDP: 4.0000 mg | ORAL_TABLET | Freq: Three times a day (TID) | ORAL | 0 refills | Status: DC | PRN

## 2020-12-09 MED ORDER — PROMETHAZINE HCL 25 MG PO TABS
25.0000 mg | ORAL_TABLET | Freq: Four times a day (QID) | ORAL | 0 refills | Status: DC | PRN
Start: 2020-12-09 — End: 2022-11-10

## 2020-12-09 MED ORDER — PANTOPRAZOLE SODIUM 20 MG PO TBEC: 40.0000 mg | DELAYED_RELEASE_TABLET | Freq: Every day | ORAL | 0 refills | Status: DC

## 2020-12-09 NOTE — ED Notes (Signed)
Patient verbalizes understanding of discharge instructions. Opportunity for questioning and answers were provided. Armband removed by staff, pt discharged from ED ambulatory.   

## 2020-12-09 NOTE — ED Triage Notes (Signed)
Emergency Medicine Provider Triage Evaluation Note  Matthew Scott , a 40 y.o. male  was evaluated in triage.  Pt complains of right side abd pain x 9 years, does not radiate, onset after cholecystectomy. Brought in by EMS. Sees GI 01/01/21. Reports pain is now improving but still present.   Review of Systems  Positive: Abdominal pain Negative: Vomiting, changes in bowel or bladder  Physical Exam  There were no vitals taken for this visit. Gen:   Awake, no distress   HEENT:  Atraumatic  Resp:  Normal effort  Cardiac:  Normal rate  Abd:   Nondistended, mild right side tenderness  MSK:   Moves extremities without difficulty  Neuro:  Speech clear   Medical Decision Making  Medically screening exam initiated at 12:58 PM.  Appropriate orders placed.  Matthew Scott was informed that the remainder of the evaluation will be completed by another provider, this initial triage assessment does not replace that evaluation, and the importance of remaining in the ED until their evaluation is complete.  Clinical Impression     Matthew Fend, PA-C 12/09/20 1541

## 2020-12-09 NOTE — ED Provider Notes (Signed)
[facility] EMERGENCY DEPARTMENT Provider Note   CSN: [id number] Arrival date & time: 12/09/20  1246     History No chief complaint on file.   Matthew Scott is a 40 y.o. male.  HPI      40 year old male with history of biliary dyskinesia, cyclic vomiting syndrome, chronic abdominal pain, gastroparesis, polysubstance abuse, alcohol abuse, hypertension, cholecystectomy, who presents with concern for abdominal pain.  Reports has had problems with abdominal pain since cholecystectomy 9 years ago, has seen other porviders and scheduled to see GI at the end of May.  Reports has episodes of very severe RUQ abdominal pain, and that it is particularly worse in the morning.  Today was having severe pain and his girl recommended he come to the ED to be evaluated. He now feels improved.  Has not had headaches, fevers.  Having BM.  Denies etoh use, recent marijuana use.  No chest pain, urinary symptoms or dyspnea.  Past Medical History:  Diagnosis Date  . 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 Chronic abdominal pain   . Cyclical vomiting syndrome   . GASTRITIS 12/09/2009   Qualifier: Diagnosis of  By: Daphine Deutscher FNP, Zena Amos    . Gastroparesis   . 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 Nausea and vomiting    chronic, recurrent  . Polysubstance abuse [facility])     Patient Active Problem List   Diagnosis Date Noted  . Anemia 03/10/2016  . Essential hypertension 12/22/2015  . GERD (gastroesophageal reflux disease) 01/21/2015  . Gastritis 12/19/2014  . Alcohol abuse 12/14/2014  . Gastroparesis 05/11/2012  . Abdominal pain 05/06/2012  . Polysubstance abuse ([facility]) 05/06/2012  . CHOLECYSTECTOMY, HX OF 11/25/2009    Past Surgical History:  Procedure Laterality Date  . CHOLECYSTECTOMY  11/2009       Family History  Problem Relation Age of Onset  . Other Father   .  Diabetes Father   . Hypertension Father     Social History   Tobacco Use  . Smoking status: Current Some Day Smoker    Years: 8.00    Types: Cigars    Last attempt to quit: 12/15/2014    Years since quitting: 5.9  . Smokeless tobacco: Never Used  . Tobacco comment: 7 cigars infused with marijuana a day  Vaping Use  . Vaping Use: Never used  Substance Use Topics  . Alcohol use: No    Alcohol/week: 0.0 standard drinks  . Drug use: Yes    Types: Marijuana    Comment: Last used: 4 days ago per patient on 04/30/2018    Home Medications Prior to Admission medications   Medication Sig Start Date End Date Taking? Authorizing Provider  ondansetron (ZOFRAN ODT) 4 MG disintegrating tablet Take 1 tablet (4 mg total) by mouth every 8 (eight) hours as needed for nausea or vomiting. 12/09/20  Yes Alvira Monday, MD  pantoprazole (PROTONIX) 20 MG tablet Take 2 tablets (40 mg total) by mouth daily for 14 days. 12/09/20 12/23/20 Yes Alvira Monday, MD  promethazine (PHENERGAN) 25 MG tablet Take 1 tablet (25 mg total) by mouth every 6 (six) hours as needed for nausea or vomiting (IF continuing severe nausea, able to swallow tablet (may use as an alternative to zofran)). 12/09/20  Yes Alvira Monday, MD  dicyclomine (BENTYL) 20 MG tablet Take 1 tablet (20 mg total) by mouth daily.  11/26/20   Storm Frisk, MD  sucralfate (CARAFATE) 1 g tablet TAKE 1 TABLET (1 G TOTAL) BY MOUTH IN THE MORNING, AT NOON, AND AT BEDTIME. 11/26/20 11/26/21  Storm Frisk, MD    Allergies    Patient has no known allergies.  Review of Systems   Review of Systems  Constitutional: Negative for fever.  HENT: Negative for sore throat.   Eyes: Negative for visual disturbance.  Respiratory: Negative for shortness of breath.   Cardiovascular: Negative for chest pain.  Gastrointestinal: Positive for nausea and vomiting. Negative for abdominal pain.  Genitourinary: Negative for difficulty urinating.  Musculoskeletal:  Negative for back pain and neck stiffness.  Skin: Negative for rash.  Neurological: Negative for syncope and headaches.    Physical Exam Updated Vital Signs BP 128/85 (BP Location: Right Arm)   Pulse 62   Temp 98.1 F (36.7 C) (Oral)   Resp 19   SpO2 99%   Physical Exam Vitals and nursing note reviewed.  Constitutional:      General: He is not in acute distress.    Appearance: Normal appearance. He is not ill-appearing, toxic-appearing or diaphoretic.  HENT:     Head: Normocephalic.  Eyes:     Conjunctiva/sclera: Conjunctivae normal.  Cardiovascular:     Rate and Rhythm: Normal rate and regular rhythm.     Pulses: Normal pulses.  Pulmonary:     Effort: Pulmonary effort is normal. No respiratory distress.  Abdominal:     General: Abdomen is flat. There is no distension.     Tenderness: There is no abdominal tenderness. There is no guarding.  Musculoskeletal:        General: No deformity or signs of injury.     Cervical back: No rigidity.  Skin:    General: Skin is warm and dry.     Coloration: Skin is not jaundiced or pale.  Neurological:     General: No focal deficit present.     Mental Status: He is alert and oriented to person, place, and time.     ED Results / Procedures / Treatments   Labs (all labs ordered are listed, but only abnormal results are displayed) Labs Reviewed  CBC WITH DIFFERENTIAL/PLATELET - Abnormal; Notable for the following components:      Result Value   WBC 12.4 (*)    RBC 3.78 (*)    Hemoglobin 12.3 (*)    HCT 38.3 (*)    MCV 101.3 (*)    Neutro Abs 10.0 (*)    All other components within normal limits  COMPREHENSIVE METABOLIC PANEL - Abnormal; Notable for the following components:   Glucose, Bld 112 (*)    Total Bilirubin 1.6 (*)    All other components within normal limits  LIPASE, BLOOD  URINALYSIS, ROUTINE W REFLEX MICROSCOPIC    EKG None  Radiology No results found.  Procedures Procedures   Medications Ordered in  ED Medications - No data to display  ED Course  I have reviewed the triage vital signs and the nursing notes.  Pertinent labs & imaging results that were available during my care of the patient were reviewed by me and considered in my medical decision making (see chart for details).    MDM Rules/Calculators/A&P                          40 year old male with history of biliary dyskinesia, cyclic vomiting syndrome, chronic abdominal pain, gastroparesis, polysubstance abuse, alcohol  abuse, hypertension, cholecystectomy, who presents with concern for abdominal pain.   DDx includes pancreatitis, hepatitis, choledocholithiasis, cholangitis, gastritis.  Doubt perforated viscous, SBO, nephrolithiasis given history and exam. No sign of pyelonephritis, hepatitis, pancreatitis on labwork.   Possible duodenal ulcer given worse in AM on empty stomach,consider other gastritis, recommend follow up with GI. Given rx for protonix, new rx for zofran for when unable to tolerate po and phenergan to help with nausea if able to tolerate swallowing the pill. Patient discharged in stable condition with understanding of reasons to return.     Final Clinical Impression(s) / ED Diagnoses Final diagnoses:  Right sided abdominal pain    Rx / DC Orders ED Discharge Orders         Ordered    ondansetron (ZOFRAN ODT) 4 MG disintegrating tablet  Every 8 hours PRN        12/09/20 1927    promethazine (PHENERGAN) 25 MG tablet  Every 6 hours PRN        12/09/20 1927    pantoprazole (PROTONIX) 20 MG tablet  Daily        12/09/20 1927           Alvira Monday, MD 12/11/20 1113

## 2020-12-09 NOTE — ED Triage Notes (Signed)
Pt here with c/o 9years of abd pain since he had his gallbladder pt has a GI appointment coming up

## 2020-12-11 ENCOUNTER — Encounter (HOSPITAL_COMMUNITY): Payer: Self-pay | Admitting: Emergency Medicine

## 2020-12-11 ENCOUNTER — Emergency Department (HOSPITAL_COMMUNITY)
Admission: EM | Admit: 2020-12-11 | Discharge: 2020-12-11 | Disposition: A | Discharge status: Home / Self Care | Payer: Self-pay | Attending: Emergency Medicine | Admitting: Emergency Medicine

## 2020-12-11 DIAGNOSIS — I10 Essential (primary) hypertension: Secondary | ICD-10-CM | POA: Insufficient documentation

## 2020-12-11 DIAGNOSIS — Z8719 Personal history of other diseases of the digestive system: Secondary | ICD-10-CM | POA: Insufficient documentation

## 2020-12-11 DIAGNOSIS — F17290 Nicotine dependence, other tobacco product, uncomplicated: Secondary | ICD-10-CM | POA: Insufficient documentation

## 2020-12-11 DIAGNOSIS — Z9049 Acquired absence of other specified parts of digestive tract: Secondary | ICD-10-CM | POA: Insufficient documentation

## 2020-12-11 DIAGNOSIS — R1084 Generalized abdominal pain: Secondary | ICD-10-CM | POA: Insufficient documentation

## 2020-12-11 DIAGNOSIS — K219 Gastro-esophageal reflux disease without esophagitis: Secondary | ICD-10-CM | POA: Insufficient documentation

## 2020-12-11 LAB — URINALYSIS, ROUTINE W REFLEX MICROSCOPIC
Bilirubin Urine: NEGATIVE
Glucose, UA: NEGATIVE mg/dL
Hgb urine dipstick: NEGATIVE
Ketones, ur: 5 mg/dL — AB
Nitrite: NEGATIVE
Protein, ur: NEGATIVE mg/dL
Specific Gravity, Urine: 1.029 (ref 1.005–1.030)
pH: 5 (ref 5.0–8.0)

## 2020-12-11 MED ORDER — LIDOCAINE VISCOUS HCL 2 % MT SOLN
15.0000 mL | Freq: Once | OROMUCOSAL | Status: AC
  Administered 2020-12-11: 15 mL via ORAL
  Filled 2020-12-11: qty 15

## 2020-12-11 MED ORDER — ALUM & MAG HYDROXIDE-SIMETH 200-200-20 MG/5ML PO SUSP
30.0000 mL | Freq: Once | ORAL | Status: AC
  Administered 2020-12-11: 30 mL via ORAL
  Filled 2020-12-11: qty 30

## 2020-12-11 NOTE — ED Provider Notes (Signed)
[facility]-EMERGENCY DEPT Provider Note   CSN: [id number] Arrival date & time: 12/11/20  1038     History Chief Complaint  Patient presents with  . Abdominal Pain    Matthew Scott is a 40 y.o. male.  40 year old male presents with ongoing right side abdominal pain x 9 years since cholecystectomy, seen in the ER 2 days ago for same and given rx for Protonix, zofran and phenergan which he has not started/picked up yet.  Denies fevers, acute changes in his chronic pain pattern.        Past Medical History:  Diagnosis Date  . 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 Chronic abdominal pain   . Cyclical vomiting syndrome   . GASTRITIS 12/09/2009   Qualifier: Diagnosis of  By: Daphine Deutscher FNP, Zena Amos    . Gastroparesis   . 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 Nausea and vomiting    chronic, recurrent  . Polysubstance abuse [facility])     Patient Active Problem List   Diagnosis Date Noted  . Anemia 03/10/2016  . Essential hypertension 12/22/2015  . GERD (gastroesophageal reflux disease) 01/21/2015  . Gastritis 12/19/2014  . Alcohol abuse 12/14/2014  . Gastroparesis 05/11/2012  . Abdominal pain 05/06/2012  . Polysubstance abuse (HCC) 05/06/2012  . CHOLECYSTECTOMY, HX OF 11/25/2009    Past Surgical History:  Procedure Laterality Date  . CHOLECYSTECTOMY  11/2009       Family History  Problem Relation Age of Onset  . Other Father   . Diabetes Father   . Hypertension Father     Social History   Tobacco Use  . Smoking status: Current Some Day Smoker    Years: 8.00    Types: Cigars    Last attempt to quit: 12/15/2014    Years since quitting: 5.9  . Smokeless tobacco: Never Used  . Tobacco comment: 7 cigars infused with marijuana a day  Vaping Use  . Vaping Use: Never used  Substance Use Topics  . Alcohol use: No    Alcohol/week: 0.0 standard  drinks  . Drug use: Yes    Types: Marijuana    Comment: Last used: 4 days ago per patient on 04/30/2018    Home Medications Prior to Admission medications   Medication Sig Start Date End Date Taking? Authorizing Provider  dicyclomine (BENTYL) 20 MG tablet Take 1 tablet (20 mg total) by mouth daily. 11/26/20   Storm Frisk, MD  ondansetron (ZOFRAN ODT) 4 MG disintegrating tablet Take 1 tablet (4 mg total) by mouth every 8 (eight) hours as needed for nausea or vomiting. 12/09/20   Alvira Monday, MD  pantoprazole (PROTONIX) 20 MG tablet Take 2 tablets (40 mg total) by mouth daily for 14 days. 12/09/20 12/23/20  Alvira Monday, MD  promethazine (PHENERGAN) 25 MG tablet Take 1 tablet (25 mg total) by mouth every 6 (six) hours as needed for nausea or vomiting (IF continuing severe nausea, able to swallow tablet (may use as an alternative to zofran)). 12/09/20   Alvira Monday, MD  sucralfate (CARAFATE) 1 g tablet TAKE 1 TABLET (1 G TOTAL) BY MOUTH IN THE MORNING, AT NOON, AND AT BEDTIME. 11/26/20 11/26/21  Storm Frisk, MD    Allergies    Patient has no known allergies.  Review of Systems   Review of Systems  Constitutional: Negative for fever.  Respiratory:  Negative for shortness of breath.   Cardiovascular: Negative for chest pain.  Gastrointestinal: Positive for abdominal pain. Negative for blood in stool, constipation, diarrhea, nausea and vomiting.  Genitourinary: Negative for dysuria.  Musculoskeletal: Negative for arthralgias, back pain and myalgias.  Skin: Negative for rash and wound.  Allergic/Immunologic: Negative for immunocompromised state.  Neurological: Negative for weakness.  Hematological: Negative for adenopathy.  Psychiatric/Behavioral: Negative for confusion.  All other systems reviewed and are negative.   Physical Exam Updated Vital Signs BP 110/61   Pulse 71   Temp 99 F (37.2 C) (Oral)   Resp 18   SpO2 97%   Physical Exam Vitals and nursing note  reviewed.  Constitutional:      General: He is not in acute distress.    Appearance: He is well-developed. He is not diaphoretic.  HENT:     Head: Normocephalic and atraumatic.  Cardiovascular:     Rate and Rhythm: Normal rate and regular rhythm.     Heart sounds: Normal heart sounds.  Pulmonary:     Effort: Pulmonary effort is normal.     Breath sounds: Normal breath sounds.  Abdominal:     Palpations: Abdomen is soft.     Tenderness: There is abdominal tenderness in the right upper quadrant and right lower quadrant. There is no right CVA tenderness or left CVA tenderness.  Skin:    General: Skin is warm and dry.     Findings: No erythema or rash.  Neurological:     Mental Status: He is alert and oriented to person, place, and time.  Psychiatric:        Behavior: Behavior normal.     ED Results / Procedures / Treatments   Labs (all labs ordered are listed, but only abnormal results are displayed) Labs Reviewed  URINALYSIS, ROUTINE W REFLEX MICROSCOPIC - Abnormal; Notable for the following components:      Result Value   Ketones, ur 5 (*)    Leukocytes,Ua TRACE (*)    Bacteria, UA RARE (*)    All other components within normal limits    EKG None  Radiology No results found.  Procedures Procedures   Medications Ordered in ED Medications  alum & mag hydroxide-simeth (MAALOX/MYLANTA) 200-200-20 MG/5ML suspension 30 mL (30 mLs Oral Given 12/11/20 1256)    And  lidocaine (XYLOCAINE) 2 % viscous mouth solution 15 mL (15 mLs Oral Given 12/11/20 1256)    ED Course  I have reviewed the triage vital signs and the nursing notes.  Pertinent labs & imaging results that were available during my care of the patient were reviewed by me and considered in my medical decision making (see chart for details).  Clinical Course as of 12/11/20 1337  Fri Dec 11, 2020  [id number] 40 year old male with ongoing abdominal pain. On exam, has mild right sided tenderness without guarding.  Labs  reviewed from ER visit to [facility] 2 days ago. Patient is afebrile, vitals stable.  Patient was given GI cocktail, advised to take the medications prescribed to him at his last visit and follow up with GI as scheduled. His tramadol was not refilled today.  [LM]    Clinical Course User Index [LM] Alden Hipp   MDM Rules/Calculators/A&P                          Final Clinical Impression(s) / ED Diagnoses Final diagnoses:  Generalized abdominal pain    Rx /  DC Orders ED Discharge Orders    None       Alden Hipp 12/11/20 1337    Milagros Loll, MD 12/12/20 [phone]

## 2020-12-11 NOTE — Discharge Instructions (Addendum)
Follow up with your GI as scheduled. Take prescriptions as prescribed at prior ER visit.

## 2020-12-11 NOTE — ED Triage Notes (Signed)
Per EMS, has abdominal pain after BM-was seen for the same symptoms at [facility] 2 days ago

## 2020-12-15 ENCOUNTER — Emergency Department (HOSPITAL_COMMUNITY)
Admission: EM | Admit: 2020-12-15 | Discharge: 2020-12-15 | Disposition: A | Discharge status: Home / Self Care | Payer: Self-pay | Attending: Emergency Medicine | Admitting: Emergency Medicine

## 2020-12-15 ENCOUNTER — Emergency Department (HOSPITAL_COMMUNITY): Payer: Self-pay

## 2020-12-15 ENCOUNTER — Encounter (HOSPITAL_COMMUNITY): Payer: Self-pay

## 2020-12-15 DIAGNOSIS — I10 Essential (primary) hypertension: Secondary | ICD-10-CM | POA: Insufficient documentation

## 2020-12-15 DIAGNOSIS — R1011 Right upper quadrant pain: Secondary | ICD-10-CM | POA: Insufficient documentation

## 2020-12-15 DIAGNOSIS — F17290 Nicotine dependence, other tobacco product, uncomplicated: Secondary | ICD-10-CM | POA: Insufficient documentation

## 2020-12-15 DIAGNOSIS — G8929 Other chronic pain: Secondary | ICD-10-CM | POA: Insufficient documentation

## 2020-12-15 DIAGNOSIS — K219 Gastro-esophageal reflux disease without esophagitis: Secondary | ICD-10-CM | POA: Insufficient documentation

## 2020-12-15 LAB — COMPREHENSIVE METABOLIC PANEL
ALT: 23 U/L (ref 0–44)
AST: 31 U/L (ref 15–41)
Albumin: 4.4 g/dL (ref 3.5–5.0)
Alkaline Phosphatase: 64 U/L (ref 38–126)
Anion gap: 9 (ref 5–15)
BUN: 11 mg/dL (ref 6–20)
CO2: 27 mmol/L (ref 22–32)
Calcium: 9.7 mg/dL (ref 8.9–10.3)
Chloride: 107 mmol/L (ref 98–111)
Creatinine, Ser: 0.73 mg/dL (ref 0.61–1.24)
GFR, Estimated: >60 mL/min (ref >60)
Glucose, Bld: 98 mg/dL (ref 70–99)
Potassium: 4 mmol/L (ref 3.5–5.1)
Sodium: 143 mmol/L (ref 135–145)
Total Bilirubin: 1.4 mg/dL — ABNORMAL HIGH (ref 0.3–1.2)
Total Protein: 7.4 g/dL (ref 6.5–8.1)

## 2020-12-15 LAB — URINALYSIS, ROUTINE W REFLEX MICROSCOPIC
Bilirubin Urine: NEGATIVE
Glucose, UA: NEGATIVE mg/dL
Hgb urine dipstick: NEGATIVE
Ketones, ur: 5 mg/dL — AB
Leukocytes,Ua: NEGATIVE
Nitrite: NEGATIVE
Protein, ur: NEGATIVE mg/dL
Specific Gravity, Urine: 1.028 (ref 1.005–1.030)
pH: 5 (ref 5.0–8.0)

## 2020-12-15 LAB — CBC WITH DIFFERENTIAL/PLATELET
Abs Immature Granulocytes: 0.02 10*3/uL (ref 0.00–0.07)
Basophils Absolute: 0 10*3/uL (ref 0.0–0.1)
Basophils Relative: 0 %
Eosinophils Absolute: 0 10*3/uL (ref 0.0–0.5)
Eosinophils Relative: 0 %
HCT: 35.6 % — ABNORMAL LOW (ref 39.0–52.0)
Hemoglobin: 11.4 g/dL — ABNORMAL LOW (ref 13.0–17.0)
Immature Granulocytes: 0 %
Lymphocytes Relative: 23 %
Lymphs Abs: 1.9 10*3/uL (ref 0.7–4.0)
MCH: 33 pg (ref 26.0–34.0)
MCHC: 32 g/dL (ref 30.0–36.0)
MCV: 103.2 fL — ABNORMAL HIGH (ref 80.0–100.0)
Monocytes Absolute: 0.6 10*3/uL (ref 0.1–1.0)
Monocytes Relative: 7 %
Neutro Abs: 5.7 10*3/uL (ref 1.7–7.7)
Neutrophils Relative %: 70 %
Platelets: 213 10*3/uL (ref 150–400)
RBC: 3.45 MIL/uL — ABNORMAL LOW (ref 4.22–5.81)
RDW: 12.3 % (ref 11.5–15.5)
WBC: 8.2 10*3/uL (ref 4.0–10.5)
nRBC: 0 % (ref 0.0–0.2)

## 2020-12-15 LAB — LIPASE, BLOOD: Lipase: 24 U/L (ref 11–51)

## 2020-12-15 MED ORDER — DICYCLOMINE HCL 20 MG PO TABS
20.0000 mg | ORAL_TABLET | Freq: Three times a day (TID) | ORAL | 0 refills | Status: DC | PRN
  Filled 2020-12-15: qty 20, 7d supply, fill #0

## 2020-12-15 MED ORDER — KETOROLAC TROMETHAMINE 30 MG/ML IJ SOLN
30.0000 mg | Freq: Once | INTRAMUSCULAR | Status: AC
  Administered 2020-12-15: 30 mg via INTRAVENOUS
  Filled 2020-12-15: qty 1

## 2020-12-15 NOTE — ED Triage Notes (Signed)
Patient presents with c/o RLQ abdominal pain. Pain was worse with BM.

## 2020-12-15 NOTE — Discharge Instructions (Signed)

## 2020-12-15 NOTE — ED Provider Notes (Signed)
Emergency Department Provider Note   I have reviewed the triage vital signs and the nursing notes.   HISTORY  Chief Complaint No chief complaint on file.   [name] is a 40 y.o. male with past medical history of chronic abdominal pain presents to the emergency department for right-sided abdominal discomfort starting today.  He states he has had almost daily pain since cholecystectomy 9 years prior.  Denies radiation to the chest or shortness of breath.  No fevers or chills.  Pain does seem worse with bowel movements.  He tells me that he has been referred to a GI doctor and has an appointment later this month on the 20th.  Denies any diarrhea or blood in the stool.  No vomiting today.   Past Medical History:  Diagnosis Date  . 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 Chronic abdominal pain   . Cyclical vomiting syndrome   . GASTRITIS 12/09/2009   Qualifier: Diagnosis of  By: Daphine Deutscher FNP, Zena Amos    . Gastroparesis   . 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 Nausea and vomiting    chronic, recurrent  . Polysubstance abuse [facility])     Patient Active Problem List   Diagnosis Date Noted  . Anemia 03/10/2016  . Essential hypertension 12/22/2015  . GERD (gastroesophageal reflux disease) 01/21/2015  . Gastritis 12/19/2014  . Alcohol abuse 12/14/2014  . Gastroparesis 05/11/2012  . Abdominal pain 05/06/2012  . Polysubstance abuse (HCC) 05/06/2012  . CHOLECYSTECTOMY, HX OF 11/25/2009    Past Surgical History:  Procedure Laterality Date  . CHOLECYSTECTOMY  11/2009    Allergies Patient has no known allergies.  Family History  Problem Relation Age of Onset  . Other Father   . Diabetes Father   . Hypertension Father     Social History Social History   Tobacco Use  . Smoking status: Current Some Day Smoker    Years: 8.00    Types: Cigars    Last attempt to  quit: 12/15/2014    Years since quitting: 6.0  . Smokeless tobacco: Never Used  . Tobacco comment: 7 cigars infused with marijuana a day  Vaping Use  . Vaping Use: Never used  Substance Use Topics  . Alcohol use: No    Alcohol/week: 0.0 standard drinks  . Drug use: Yes    Types: Marijuana    Comment: Last used: 4 days ago per patient on 04/30/2018    Review of Systems  Constitutional: No fever/chills Eyes: No visual changes. ENT: No sore throat. Cardiovascular: Denies chest pain. Respiratory: Denies shortness of breath. Gastrointestinal: Positive right sided abdominal pain.  No nausea, no vomiting.  No diarrhea.  No constipation. Genitourinary: Negative for dysuria. Musculoskeletal: Negative for back pain. Skin: Negative for rash. Neurological: Negative for headaches, focal weakness or numbness.  10-point ROS otherwise negative.  ____________________________________________   PHYSICAL EXAM:  VITAL SIGNS: ED Triage Vitals  Enc Vitals Group     BP 12/15/20 1803 134/77     Pulse Rate 12/15/20 1803 68     Resp 12/15/20 1803 15     Temp 12/15/20 1803 98 F (36.7 C)     Temp Source 12/15/20 1803 Oral     SpO2 12/15/20 1750 99 %   Constitutional: Alert and oriented. Well appearing and in no acute distress. Eyes: Conjunctivae are normal. Head: Atraumatic. Nose: No congestion/rhinnorhea.  Mouth/Throat: Mucous membranes are moist. Neck: No stridor.   Cardiovascular: Normal rate, regular rhythm. Good peripheral circulation. Grossly normal heart sounds.   Respiratory: Normal respiratory effort.  No retractions. Lungs CTAB. Gastrointestinal: Soft with right sided abdominal tenderness with voluntary guarding. Non-tender left abdominal tenderness. No distention.  Musculoskeletal: No gross deformities of extremities. Neurologic:  Normal speech and language.  Skin:  Skin is warm, dry and intact. No rash noted.   ____________________________________________   LABS (all labs  ordered are listed, but only abnormal results are displayed)  Labs Reviewed  COMPREHENSIVE METABOLIC PANEL - Abnormal; Notable for the following components:      Result Value   Total Bilirubin 1.4 (*)    All other components within normal limits  CBC WITH DIFFERENTIAL/PLATELET - Abnormal; Notable for the following components:   RBC 3.45 (*)    Hemoglobin 11.4 (*)    HCT 35.6 (*)    MCV 103.2 (*)    All other components within normal limits  URINALYSIS, ROUTINE W REFLEX MICROSCOPIC - Abnormal; Notable for the following components:   Ketones, ur 5 (*)    All other components within normal limits  LIPASE, BLOOD   ____________________________________________  RADIOLOGY  US Abdomen Limited RUQ (LIVER/GB)  Result Date: 12/15/2020 CLINICAL DATA:  Right upper quadrant pain, previous cholecystectomy EXAM: ULTRASOUND ABDOMEN LIMITED RIGHT UPPER QUADRANT COMPARISON:  09/30/2020 FINDINGS: Gallbladder: Surgically absent Common bile duct: Diameter: 3 mm Liver: No focal lesion identified. Within normal limits in parenchymal echogenicity. Portal vein is patent on color Doppler imaging with normal direction of blood flow towards the liver. Other: None. IMPRESSION: 1. Unremarkable right upper quadrant ultrasound in a patient status post cholecystectomy. Electronically Signed   By: Sharlet Salina M.D.   On: 12/15/2020 19:19    ____________________________________________   PROCEDURES  Procedure(s) performed:   Procedures  None  ____________________________________________   INITIAL IMPRESSION / ASSESSMENT AND PLAN / ED COURSE  Pertinent labs & imaging results that were available during my care of the patient were reviewed by me and considered in my medical decision making (see chart for details).   Patient presents to the emergency department with return of right-sided abdominal pain.  He has chronic pain in review of his records here with multiple ED presentations for similar.  He does have  some tenderness on exam in the right upper quadrant actually.  Plan for right upper quadrant ultrasound although does have a prior history of cholecystectomy but will need to evaluate for duct dilation.  Has had multiple CT scans with his last one being in February but in years prior has had multiple scans per year.  Apparently has follow-up with GI scheduled later this month.   [place] negative. Labs normal. Patient feeling improved. Plan for discharge. Patient has GI appointment later this month.  ____________________________________________  FINAL CLINICAL IMPRESSION(S) / ED DIAGNOSES  Final diagnoses:  RUQ abdominal pain    MEDICATIONS GIVEN DURING THIS VISIT:  Medications  ketorolac (TORADOL) 30 MG/ML injection 30 mg (30 mg Intravenous Given 12/15/20 1907)    Note:  This document was prepared using Dragon voice recognition software and may include unintentional dictation errors.  Alona Bene, MD, [facility] Emergency Medicine    Dimitrious Micciche, Arlyss Repress, MD 12/15/20 [phone]

## 2020-12-16 ENCOUNTER — Other Ambulatory Visit: Payer: Self-pay

## 2020-12-23 ENCOUNTER — Other Ambulatory Visit: Payer: Self-pay

## 2021-01-01 ENCOUNTER — Ambulatory Visit: Payer: Self-pay | Admitting: Gastroenterology

## 2021-01-10 ENCOUNTER — Other Ambulatory Visit: Payer: Self-pay

## 2021-01-10 ENCOUNTER — Emergency Department (HOSPITAL_COMMUNITY)
Admission: EM | Admit: 2021-01-10 | Discharge: 2021-01-10 | Disposition: A | Discharge status: Home / Self Care | Payer: Self-pay | Attending: Emergency Medicine | Admitting: Emergency Medicine

## 2021-01-10 DIAGNOSIS — R197 Diarrhea, unspecified: Secondary | ICD-10-CM | POA: Insufficient documentation

## 2021-01-10 DIAGNOSIS — R059 Cough, unspecified: Secondary | ICD-10-CM | POA: Insufficient documentation

## 2021-01-10 DIAGNOSIS — F17290 Nicotine dependence, other tobacco product, uncomplicated: Secondary | ICD-10-CM | POA: Insufficient documentation

## 2021-01-10 DIAGNOSIS — I10 Essential (primary) hypertension: Secondary | ICD-10-CM | POA: Insufficient documentation

## 2021-01-10 DIAGNOSIS — R0981 Nasal congestion: Secondary | ICD-10-CM | POA: Insufficient documentation

## 2021-01-10 DIAGNOSIS — R1013 Epigastric pain: Secondary | ICD-10-CM | POA: Insufficient documentation

## 2021-01-10 DIAGNOSIS — Z20822 Contact with and (suspected) exposure to covid-19: Secondary | ICD-10-CM | POA: Insufficient documentation

## 2021-01-10 DIAGNOSIS — R112 Nausea with vomiting, unspecified: Secondary | ICD-10-CM | POA: Insufficient documentation

## 2021-01-10 DIAGNOSIS — R1011 Right upper quadrant pain: Secondary | ICD-10-CM | POA: Insufficient documentation

## 2021-01-10 LAB — COMPREHENSIVE METABOLIC PANEL
ALT: 22 U/L (ref 0–44)
AST: 24 U/L (ref 15–41)
Albumin: 4.1 g/dL (ref 3.5–5.0)
Alkaline Phosphatase: 59 U/L (ref 38–126)
Anion gap: 8 (ref 5–15)
BUN: 13 mg/dL (ref 6–20)
CO2: 26 mmol/L (ref 22–32)
Calcium: 9.5 mg/dL (ref 8.9–10.3)
Chloride: 107 mmol/L (ref 98–111)
Creatinine, Ser: 0.89 mg/dL (ref 0.61–1.24)
GFR, Estimated: >60 mL/min (ref >60)
Glucose, Bld: 98 mg/dL (ref 70–99)
Potassium: 3.9 mmol/L (ref 3.5–5.1)
Sodium: 141 mmol/L (ref 135–145)
Total Bilirubin: 1 mg/dL (ref 0.3–1.2)
Total Protein: 7.2 g/dL (ref 6.5–8.1)

## 2021-01-10 LAB — CBC WITH DIFFERENTIAL/PLATELET
Abs Immature Granulocytes: 0.02 10*3/uL (ref 0.00–0.07)
Basophils Absolute: 0 10*3/uL (ref 0.0–0.1)
Basophils Relative: 0 %
Eosinophils Absolute: 0 10*3/uL (ref 0.0–0.5)
Eosinophils Relative: 0 %
HCT: 38.5 % — ABNORMAL LOW (ref 39.0–52.0)
Hemoglobin: 12.4 g/dL — ABNORMAL LOW (ref 13.0–17.0)
Immature Granulocytes: 0 %
Lymphocytes Relative: 14 %
Lymphs Abs: 1.1 10*3/uL (ref 0.7–4.0)
MCH: 33.1 pg (ref 26.0–34.0)
MCHC: 32.2 g/dL (ref 30.0–36.0)
MCV: 102.7 fL — ABNORMAL HIGH (ref 80.0–100.0)
Monocytes Absolute: 0.5 10*3/uL (ref 0.1–1.0)
Monocytes Relative: 6 %
Neutro Abs: 6.3 10*3/uL (ref 1.7–7.7)
Neutrophils Relative %: 80 %
Platelets: 212 10*3/uL (ref 150–400)
RBC: 3.75 MIL/uL — ABNORMAL LOW (ref 4.22–5.81)
RDW: 12.5 % (ref 11.5–15.5)
WBC: 7.9 10*3/uL (ref 4.0–10.5)
nRBC: 0 % (ref 0.0–0.2)

## 2021-01-10 LAB — RESP PANEL BY RT-PCR (FLU A&B, COVID) ARPGX2
Influenza A by PCR: NEGATIVE
Influenza B by PCR: NEGATIVE
SARS Coronavirus 2 by RT PCR: NEGATIVE

## 2021-01-10 LAB — LIPASE, BLOOD: Lipase: 35 U/L (ref 11–51)

## 2021-01-10 MED ORDER — ALUM & MAG HYDROXIDE-SIMETH 200-200-20 MG/5ML PO SUSP
30.0000 mL | Freq: Once | ORAL | Status: AC
  Administered 2021-01-10: 30 mL via ORAL
  Filled 2021-01-10: qty 30

## 2021-01-10 MED ORDER — ONDANSETRON HCL 4 MG/2ML IJ SOLN
4.0000 mg | Freq: Once | INTRAMUSCULAR | Status: AC
  Administered 2021-01-10: 4 mg via INTRAVENOUS
  Filled 2021-01-10: qty 2

## 2021-01-10 MED ORDER — LIDOCAINE VISCOUS HCL 2 % MT SOLN
15.0000 mL | Freq: Once | OROMUCOSAL | Status: AC
  Administered 2021-01-10: 15 mL via ORAL
  Filled 2021-01-10: qty 15

## 2021-01-10 MED ORDER — PANTOPRAZOLE SODIUM 40 MG IV SOLR
40.0000 mg | Freq: Once | INTRAVENOUS | Status: AC
  Administered 2021-01-10: 40 mg via INTRAVENOUS
  Filled 2021-01-10: qty 40

## 2021-01-10 MED ORDER — SODIUM CHLORIDE 0.9 % IV BOLUS
1000.0000 mL | Freq: Once | INTRAVENOUS | Status: AC
  Administered 2021-01-10: 1000 mL via INTRAVENOUS

## 2021-01-10 NOTE — ED Provider Notes (Signed)
[facility]-EMERGENCY DEPT Provider Note   CSN: [id number] Arrival date & time: 01/10/21  1117     History Chief Complaint  Patient presents with  . Abdominal Pain  . gastritis    Matthew Scott is a 40 y.o. male.  Patient is a 40 year old male with a history of chronic abdominal pain and gastroparesis who presents with upper abdominal pain and vomiting.  He is status post cholecystectomy.  He has been seen multiple times for similar complaints.  He says he is having a flareup of his similar symptoms.  He has pain across his upper abdomen mostly in the right side of abdomen associated nausea and vomiting.  His emesis is nonbloody and on bilious.  He is having some loose stools as well.  He denies any known fevers.  He does have some nasal congestion and mild coughing with a recent exposure to his "home girl" who was sick with a cold as well.  No shortness of breath.        Past Medical History:  Diagnosis Date  . 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 Chronic abdominal pain   . Cyclical vomiting syndrome   . GASTRITIS 12/09/2009   Qualifier: Diagnosis of  By: Daphine Deutscher FNP, Zena Amos    . Gastroparesis   . 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 Nausea and vomiting    chronic, recurrent  . Polysubstance abuse [facility])     Patient Active Problem List   Diagnosis Date Noted  . Anemia 03/10/2016  . Essential hypertension 12/22/2015  . GERD (gastroesophageal reflux disease) 01/21/2015  . Gastritis 12/19/2014  . Alcohol abuse 12/14/2014  . Gastroparesis 05/11/2012  . Abdominal pain 05/06/2012  . Polysubstance abuse (HCC) 05/06/2012  . CHOLECYSTECTOMY, HX OF 11/25/2009    Past Surgical History:  Procedure Laterality Date  . CHOLECYSTECTOMY  11/2009       Family History  Problem Relation Age of Onset  . Other Father   . Diabetes Father   .  Hypertension Father     Social History   Tobacco Use  . Smoking status: Current Some Day Smoker    Years: 8.00    Types: Cigars    Last attempt to quit: 12/15/2014    Years since quitting: 6.0  . Smokeless tobacco: Never Used  . Tobacco comment: 7 cigars infused with marijuana a day  Vaping Use  . Vaping Use: Never used  Substance Use Topics  . Alcohol use: No    Alcohol/week: 0.0 standard drinks  . Drug use: Yes    Types: Marijuana    Comment: Last used: 4 days ago per patient on 04/30/2018    Home Medications Prior to Admission medications   Medication Sig Start Date End Date Taking? Authorizing Provider  dicyclomine (BENTYL) 20 MG tablet Take 1 tablet (20 mg total) by mouth 3 (three) times daily as needed for spasms. 12/15/20   Long, Arlyss Repress, MD  ondansetron (ZOFRAN ODT) 4 MG disintegrating tablet Take 1 tablet (4 mg total) by mouth every 8 (eight) hours as needed for nausea or vomiting. 12/09/20   Alvira Monday, MD  pantoprazole (PROTONIX) 20 MG tablet Take 2 tablets (40 mg total) by mouth daily for 14 days. 12/09/20 12/23/20  Alvira Monday, MD  promethazine (PHENERGAN) 25 MG tablet Take 1 tablet (25 mg total) by mouth every 6 (six) hours as  needed for nausea or vomiting (IF continuing severe nausea, able to swallow tablet (may use as an alternative to zofran)). 12/09/20   Alvira Monday, MD  sucralfate (CARAFATE) 1 g tablet TAKE 1 TABLET (1 G TOTAL) BY MOUTH IN THE MORNING, AT NOON, AND AT BEDTIME. 11/26/20 11/26/21  Storm Frisk, MD    Allergies    Patient has no known allergies.  Review of Systems   Review of Systems  Constitutional: Negative for chills, diaphoresis, fatigue and fever.  HENT: Positive for congestion and rhinorrhea. Negative for sneezing.   Eyes: Negative.   Respiratory: Positive for cough. Negative for chest tightness and shortness of breath.   Cardiovascular: Negative for chest pain and leg swelling.  Gastrointestinal: Positive for abdominal  pain, nausea and vomiting. Negative for blood in stool and diarrhea.  Genitourinary: Negative for difficulty urinating, flank pain, frequency and hematuria.  Musculoskeletal: Negative for arthralgias and back pain.  Skin: Negative for rash.  Neurological: Negative for dizziness, speech difficulty, weakness, numbness and headaches.    Physical Exam Updated Vital Signs BP 126/89   Pulse 83   Temp 98.3 F (36.8 C) (Oral)   Resp 18   Ht 5\' 9"  (1.753 m)   SpO2 97%   BMI 25.28 kg/m   Physical Exam Constitutional:      Appearance: He is well-developed.  HENT:     Head: Normocephalic and atraumatic.  Eyes:     Pupils: Pupils are equal, round, and reactive to light.  Cardiovascular:     Rate and Rhythm: Normal rate and regular rhythm.     Heart sounds: Normal heart sounds.  Pulmonary:     Effort: Pulmonary effort is normal. No respiratory distress.     Breath sounds: Normal breath sounds. No wheezing or rales.  Chest:     Chest wall: No tenderness.  Abdominal:     General: Bowel sounds are normal.     Palpations: Abdomen is soft.     Tenderness: There is abdominal tenderness in the right upper quadrant and epigastric area. There is no guarding or rebound.  Musculoskeletal:        General: Normal range of motion.     Cervical back: Normal range of motion and neck supple.  Lymphadenopathy:     Cervical: No cervical adenopathy.  Skin:    General: Skin is warm and dry.     Findings: No rash.  Neurological:     Mental Status: He is alert and oriented to person, place, and time.     ED Results / Procedures / Treatments   Labs (all labs ordered are listed, but only abnormal results are displayed) Labs Reviewed  CBC WITH DIFFERENTIAL/PLATELET - Abnormal; Notable for the following components:      Result Value   RBC 3.75 (*)    Hemoglobin 12.4 (*)    HCT 38.5 (*)    MCV 102.7 (*)    All other components within normal limits  RESP PANEL BY RT-PCR (FLU A&B, COVID) ARPGX2   COMPREHENSIVE METABOLIC PANEL  LIPASE, BLOOD    EKG None  Radiology No results found.  Procedures Procedures   Medications Ordered in ED Medications  alum & mag hydroxide-simeth (MAALOX/MYLANTA) 200-200-20 MG/5ML suspension 30 mL (30 mLs Oral Given 01/10/21 1211)    And  lidocaine (XYLOCAINE) 2 % viscous mouth solution 15 mL (15 mLs Oral Given 01/10/21 1211)  ondansetron (ZOFRAN) injection 4 mg (4 mg Intravenous Given 01/10/21 1211)  sodium chloride 0.9 % bolus 1,000  mL (0 mLs Intravenous Stopped 01/10/21 1343)  pantoprazole (PROTONIX) injection 40 mg (40 mg Intravenous Given 01/10/21 1211)    ED Course  I have reviewed the triage vital signs and the nursing notes.  Pertinent labs & imaging results that were available during my care of the patient were reviewed by me and considered in my medical decision making (see chart for details).    MDM Rules/Calculators/A&P                          Patient is a 40 year old male who presents with exacerbation of his chronic abdominal pain.  His labs are nonconcerning.  He feels better after treatment in the ED.  He is tolerating crackers and cola without exacerbation of symptoms.  He did have some URI symptoms and his COVID test is negative.  He was discharged home in good condition.  He was encouraged to follow-up with his PCP.  He states that he had an appointment with a gastroenterologist but his phone broke and his new phone does not have his counter information.  I advised him to call his primary care doctor to get information about this appointment.  Return precautions were given. Final Clinical Impression(s) / ED Diagnoses Final diagnoses:  Right upper quadrant abdominal pain    Rx / DC Orders ED Discharge Orders    None       Rolan Bucco, MD 01/10/21 1433

## 2021-01-10 NOTE — ED Triage Notes (Signed)
Patient bib [facility], reports history of gastritis x9 years s/p cholecystectomy. Patient reports to EMS that gastritis flares up every morning and he must use hot water and marijuana to keep pain controllable. Patient presents to ED with abd pain RUQ rated 10/10 .

## 2021-01-10 NOTE — ED Notes (Signed)
Patient drank a few sips of coca cola, ate saltine crackers. States he is hurting a little bit

## 2021-01-15 ENCOUNTER — Emergency Department (HOSPITAL_COMMUNITY)
Admission: EM | Admit: 2021-01-15 | Discharge: 2021-01-15 | Disposition: A | Discharge status: Home / Self Care | Payer: Self-pay | Attending: Emergency Medicine | Admitting: Emergency Medicine

## 2021-01-15 ENCOUNTER — Encounter (HOSPITAL_COMMUNITY): Payer: Self-pay | Admitting: Pharmacy Technician

## 2021-01-15 ENCOUNTER — Emergency Department (HOSPITAL_COMMUNITY)
Admission: EM | Admit: 2021-01-15 | Discharge: 2021-01-15 | Discharge status: Against Medical Advice | Payer: Self-pay | Attending: Emergency Medicine | Admitting: Emergency Medicine

## 2021-01-15 ENCOUNTER — Other Ambulatory Visit: Payer: Self-pay

## 2021-01-15 DIAGNOSIS — F1290 Cannabis use, unspecified, uncomplicated: Secondary | ICD-10-CM

## 2021-01-15 DIAGNOSIS — Z5321 Procedure and treatment not carried out due to patient leaving prior to being seen by health care provider: Secondary | ICD-10-CM | POA: Insufficient documentation

## 2021-01-15 DIAGNOSIS — R109 Unspecified abdominal pain: Secondary | ICD-10-CM | POA: Insufficient documentation

## 2021-01-15 DIAGNOSIS — F17290 Nicotine dependence, other tobacco product, uncomplicated: Secondary | ICD-10-CM | POA: Insufficient documentation

## 2021-01-15 DIAGNOSIS — F1210 Cannabis abuse, uncomplicated: Secondary | ICD-10-CM | POA: Insufficient documentation

## 2021-01-15 DIAGNOSIS — I10 Essential (primary) hypertension: Secondary | ICD-10-CM | POA: Insufficient documentation

## 2021-01-15 DIAGNOSIS — G8929 Other chronic pain: Secondary | ICD-10-CM | POA: Insufficient documentation

## 2021-01-15 DIAGNOSIS — R1013 Epigastric pain: Secondary | ICD-10-CM | POA: Insufficient documentation

## 2021-01-15 LAB — CBC WITH DIFFERENTIAL/PLATELET
Abs Immature Granulocytes: 0.01 10*3/uL (ref 0.00–0.07)
Basophils Absolute: 0 10*3/uL (ref 0.0–0.1)
Basophils Relative: 0 %
Eosinophils Absolute: 0 10*3/uL (ref 0.0–0.5)
Eosinophils Relative: 1 %
HCT: 38.3 % — ABNORMAL LOW (ref 39.0–52.0)
Hemoglobin: 12.4 g/dL — ABNORMAL LOW (ref 13.0–17.0)
Immature Granulocytes: 0 %
Lymphocytes Relative: 24 %
Lymphs Abs: 1.5 10*3/uL (ref 0.7–4.0)
MCH: 33 pg (ref 26.0–34.0)
MCHC: 32.4 g/dL (ref 30.0–36.0)
MCV: 101.9 fL — ABNORMAL HIGH (ref 80.0–100.0)
Monocytes Absolute: 0.4 10*3/uL (ref 0.1–1.0)
Monocytes Relative: 7 %
Neutro Abs: 4.3 10*3/uL (ref 1.7–7.7)
Neutrophils Relative %: 68 %
Platelets: 222 10*3/uL (ref 150–400)
RBC: 3.76 MIL/uL — ABNORMAL LOW (ref 4.22–5.81)
RDW: 12.5 % (ref 11.5–15.5)
WBC: 6.3 10*3/uL (ref 4.0–10.5)
nRBC: 0 % (ref 0.0–0.2)

## 2021-01-15 LAB — COMPREHENSIVE METABOLIC PANEL
ALT: 19 U/L (ref 0–44)
AST: 19 U/L (ref 15–41)
Albumin: 4.1 g/dL (ref 3.5–5.0)
Alkaline Phosphatase: 63 U/L (ref 38–126)
Anion gap: 5 (ref 5–15)
BUN: 14 mg/dL (ref 6–20)
CO2: 26 mmol/L (ref 22–32)
Calcium: 9.4 mg/dL (ref 8.9–10.3)
Chloride: 110 mmol/L (ref 98–111)
Creatinine, Ser: 0.86 mg/dL (ref 0.61–1.24)
GFR, Estimated: >60 mL/min (ref >60)
Glucose, Bld: 109 mg/dL — ABNORMAL HIGH (ref 70–99)
Potassium: 4 mmol/L (ref 3.5–5.1)
Sodium: 141 mmol/L (ref 135–145)
Total Bilirubin: 1.2 mg/dL (ref 0.3–1.2)
Total Protein: 7.1 g/dL (ref 6.5–8.1)

## 2021-01-15 LAB — LIPASE, BLOOD: Lipase: 22 U/L (ref 11–51)

## 2021-01-15 MED ORDER — DICYCLOMINE HCL 10 MG/ML IM SOLN
20.0000 mg | Freq: Once | INTRAMUSCULAR | Status: AC
  Administered 2021-01-15: 20 mg via INTRAMUSCULAR
  Filled 2021-01-15: qty 2

## 2021-01-15 MED ORDER — SUCRALFATE 1 GM/10ML PO SUSP
1.0000 g | Freq: Three times a day (TID) | ORAL | 0 refills | Status: DC
  Filled 2021-01-15: qty 420, 11d supply, fill #0

## 2021-01-15 MED ORDER — LIDOCAINE VISCOUS HCL 2 % MT SOLN
15.0000 mL | Freq: Once | OROMUCOSAL | Status: AC
  Administered 2021-01-15: 15 mL via ORAL
  Filled 2021-01-15: qty 15

## 2021-01-15 MED ORDER — ALUM & MAG HYDROXIDE-SIMETH 200-200-20 MG/5ML PO SUSP
30.0000 mL | Freq: Once | ORAL | Status: AC
  Administered 2021-01-15: 30 mL via ORAL
  Filled 2021-01-15: qty 30

## 2021-01-15 MED ORDER — ONDANSETRON 4 MG PO TBDP
4.0000 mg | ORAL_TABLET | Freq: Once | ORAL | Status: AC
  Administered 2021-01-15: 4 mg via ORAL
  Filled 2021-01-15: qty 1

## 2021-01-15 MED ORDER — ONDANSETRON 4 MG PO TBDP
4.0000 mg | ORAL_TABLET | Freq: Three times a day (TID) | ORAL | 0 refills | Status: DC | PRN
  Filled 2021-01-15: qty 20, 7d supply, fill #0

## 2021-01-15 MED ORDER — ACETAMINOPHEN 500 MG PO TABS
1000.0000 mg | ORAL_TABLET | Freq: Once | ORAL | Status: AC
  Administered 2021-01-15: 1000 mg via ORAL
  Filled 2021-01-15: qty 2

## 2021-01-15 NOTE — ED Provider Notes (Signed)
Emergency Medicine Provider Triage Evaluation Note  Matthew Scott , a 40 y.o. male  was evaluated in triage.  Pt complains of ongoing abdominal pain.  Discharged from [facility] 45 minutes ago.  Unable to have a bowel movement today.  Denies any vomiting.  Review of Systems  Positive: Abdominal pain Negative: Vomiting  Physical Exam  There were no vitals taken for this visit. Gen:   Awake, no distress Resp:  Normal effort MSK:   Moves extremities without difficulty Other:  Abdomen is soft  Medical Decision Making  Medically screening exam initiated at 3:46 PM.  Appropriate orders placed.  Matthew Scott was informed that the remainder of the evaluation will be completed by another provider, this initial triage assessment does not replace that evaluation, and the importance of remaining in the ED until their evaluation is complete.  Lab work from 3 years ago at [facility] without any emergent abnormalities   Dietrich Pates, PA-C 01/15/21 1547    Margarita Grizzle, MD 01/18/21 [phone]

## 2021-01-15 NOTE — ED Notes (Signed)
Gold top was sent to lab

## 2021-01-15 NOTE — ED Provider Notes (Signed)
[facility]-EMERGENCY DEPT Provider Note   CSN: [id number] Arrival date & time: 01/15/21  1107     History Chief Complaint  Patient presents with  . Abdominal Pain    Matthew Scott is a 40 y.o. male with history of chronic abdominal pain, marijuana use, cholecystectomy presents to the ER for evaluation of abdominal pain. This has been going on for a long time ever since they took his gallbladder out 8 years ago, but it was severe this morning. "I had to come".  Pain is in epigastric and right upper abdomen. It does not radiate. Sharp. Has had nausea but no vomiting so far. No fever, CP, SOB, diarrhea. Last BM 2 days ago. No dysuria or hematuria, flank pain. He takes 2 pills for his pain but doesn't know the name. He thinks it is protonix and bentyl. These medicine don't help. Pain is usually worse in the morning. Gets better during the day. Can't eat until the evening.  Last night he had oatmeal for dinner. Denies alcohol use. Denies NSAID use. He smokes marijuana daily. States it is the only thing that helps his pain and that is why he keeps doing it.  He has a GI appointment 6/21.   HPI     Past Medical History:  Diagnosis Date  . 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 Chronic abdominal pain   . Cyclical vomiting syndrome   . GASTRITIS 12/09/2009   Qualifier: Diagnosis of  By: Daphine Deutscher FNP, Zena Amos    . Gastroparesis   . 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 Nausea and vomiting    chronic, recurrent  . Polysubstance abuse [facility])     Patient Active Problem List   Diagnosis Date Noted  . Anemia 03/10/2016  . Essential hypertension 12/22/2015  . GERD (gastroesophageal reflux disease) 01/21/2015  . Gastritis 12/19/2014  . Alcohol abuse 12/14/2014  . Gastroparesis 05/11/2012  . Abdominal pain 05/06/2012  . Polysubstance abuse (HCC) 05/06/2012  .  CHOLECYSTECTOMY, HX OF 11/25/2009    Past Surgical History:  Procedure Laterality Date  . CHOLECYSTECTOMY  11/2009       Family History  Problem Relation Age of Onset  . Other Father   . Diabetes Father   . Hypertension Father     Social History   Tobacco Use  . Smoking status: Current Some Day Smoker    Years: 8.00    Types: Cigars    Last attempt to quit: 12/15/2014    Years since quitting: 6.0  . Smokeless tobacco: Never Used  . Tobacco comment: 7 cigars infused with marijuana a day  Vaping Use  . Vaping Use: Never used  Substance Use Topics  . Alcohol use: No    Alcohol/week: 0.0 standard drinks  . Drug use: Yes    Types: Marijuana    Comment: Last used: 4 days ago per patient on 04/30/2018    Home Medications Prior to Admission medications   Medication Sig Start Date End Date Taking? Authorizing Provider  ondansetron (ZOFRAN ODT) 4 MG disintegrating tablet Take 1 tablet (4 mg total) by mouth every 8 (eight) hours as needed for nausea or vomiting. 01/15/21  Yes Sharen Heck J, PA-C  sucralfate (CARAFATE) 1 GM/10ML suspension Take 10 mLs (1 g total) by mouth 4 (four) times daily -  with meals and at bedtime. 01/15/21  Yes Sharen Heck  J, PA-C  dicyclomine (BENTYL) 20 MG tablet Take 1 tablet (20 mg total) by mouth 3 (three) times daily as needed for spasms. 12/15/20   Long, Arlyss Repress, MD  pantoprazole (PROTONIX) 20 MG tablet Take 2 tablets (40 mg total) by mouth daily for 14 days. 12/09/20 12/23/20  Alvira Monday, MD  promethazine (PHENERGAN) 25 MG tablet Take 1 tablet (25 mg total) by mouth every 6 (six) hours as needed for nausea or vomiting (IF continuing severe nausea, able to swallow tablet (may use as an alternative to zofran)). 12/09/20   Alvira Monday, MD    Allergies    Patient has no known allergies.  Review of Systems   Review of Systems  Gastrointestinal: Positive for abdominal pain and nausea.  All other systems reviewed and are  negative.   Physical Exam Updated Vital Signs BP 123/77   Pulse (!) 53   Temp 98.3 F (36.8 C) (Oral)   Resp 18   SpO2 100%   Physical Exam Vitals and nursing note reviewed.  Constitutional:      Appearance: He is well-developed.     Comments: Non toxic.  HENT:     Head: Normocephalic and atraumatic.     Nose: Nose normal.  Eyes:     Conjunctiva/sclera: Conjunctivae normal.  Cardiovascular:     Rate and Rhythm: Normal rate and regular rhythm.     Heart sounds: Normal heart sounds.  Pulmonary:     Effort: Pulmonary effort is normal.     Breath sounds: Normal breath sounds.  Abdominal:     General: Bowel sounds are normal.     Palpations: Abdomen is soft.     Tenderness: There is abdominal tenderness in the right upper quadrant and epigastric area.     Comments: No G/R/R. No suprapubic or CVA tenderness. Negative Murphy's and McBurney's  Musculoskeletal:        General: Normal range of motion.     Cervical back: Normal range of motion.  Skin:    General: Skin is warm and dry.     Capillary Refill: Capillary refill takes less than 2 seconds.     Comments: Skin normal over abdomen/flank  Neurological:     Mental Status: He is alert.  Psychiatric:        Behavior: Behavior normal.     ED Results / Procedures / Treatments   Labs (all labs ordered are listed, but only abnormal results are displayed) Labs Reviewed  CBC WITH DIFFERENTIAL/PLATELET - Abnormal; Notable for the following components:      Result Value   RBC 3.76 (*)    Hemoglobin 12.4 (*)    HCT 38.3 (*)    MCV 101.9 (*)    All other components within normal limits  COMPREHENSIVE METABOLIC PANEL - Abnormal; Notable for the following components:   Glucose, Bld 109 (*)    All other components within normal limits  LIPASE, BLOOD  URINALYSIS, ROUTINE W REFLEX MICROSCOPIC    EKG None  Radiology No results found.  Procedures Procedures   Medications Ordered in ED Medications  alum & mag  hydroxide-simeth (MAALOX/MYLANTA) 200-200-20 MG/5ML suspension 30 mL (30 mLs Oral Given 01/15/21 1202)    And  lidocaine (XYLOCAINE) 2 % viscous mouth solution 15 mL (15 mLs Oral Given 01/15/21 1202)  dicyclomine (BENTYL) injection 20 mg (20 mg Intramuscular Given 01/15/21 1202)  ondansetron (ZOFRAN-ODT) disintegrating tablet 4 mg (4 mg Oral Given 01/15/21 1201)  acetaminophen (TYLENOL) tablet 1,000 mg (1,000 mg Oral Given  01/15/21 1202)    ED Course  I have reviewed the triage vital signs and the nursing notes.  Pertinent labs & imaging results that were available during my care of the patient were reviewed by me and considered in my medical decision making (see chart for details).    MDM Rules/Calculators/A&P                           40 y.o. yo male presents to the ED for right upper quadrant abdominal pain, nausea.  History of the same.  Additional information obtained from chart, nursing and triage notes review  Chart review reveals -patient is seen in the ED for right upper abdominal pain several times each month.  He has had several CT scans.  Last CT A/P in February 2022 and right upper quadrant ultrasound last month.  History of cholecystectomy.  Normal diameter of CBD.  He has tiny nonobstructing right renal stones.  Ordered lab, imaging were personally reviewed and interpreted  Labs reveal -unremarkable.  Stable hemoglobin.  Normal WBC.  Normal LFTs, lipase.  He has no urinary symptoms, suprapubic or CVA tenderness and I do not think urinalysis is necessary.  Medicines ordered -GI cocktail, Bentyl, Zofran, Tylenol  ED course & MDM  1510: Patient has had improvement in symptoms after medicines.  Given duration of pain, chronicity, benign lab work, improvement in pain feel it is reasonable to defer further emergent lab work, imaging.  He is tolerating p.o.  Stressed importance of continuing Protonix, Bentyl at home.  I have prescribed him Carafate suspension, Zofran.  He smokes  marijuana daily which helps with his symptoms, he was educated on cannabinoid hyperemesis syndrome encouraged to cut back and stop.  He has appointment with GI on the 6/21.  Return precautions discussed.  Patient is comfortable with the plan.  Portions of this note were generated with Scientist, clinical (histocompatibility and immunogenetics). Dictation errors may occur despite best attempts at proofreading    Final Clinical Impression(s) / ED Diagnoses Final diagnoses:  Chronic abdominal pain  Marijuana use    Rx / DC Orders ED Discharge Orders         Ordered    sucralfate (CARAFATE) 1 GM/10ML suspension  3 times daily with meals & bedtime        01/15/21 1344    ondansetron (ZOFRAN ODT) 4 MG disintegrating tablet  Every 8 hours PRN        01/15/21 1344           Jerrell Mylar 01/15/21 1510    Cheryll Cockayne, MD 01/16/21 1029

## 2021-01-15 NOTE — ED Triage Notes (Signed)
Pt here with abdominal pain onset today. Seen at [facility] earlier today. Pt in NAD. VSS with EMS.

## 2021-01-15 NOTE — Discharge Instructions (Signed)
You were seen in the ER for abdominal pain. Labs were normal.   I suspect your symptoms may be from gastritis, acid reflux or a side effect from marijuana use.   All of these are managed similarly.   We will treat this with anti-acid medicines.  Start taking over the counter omeprazole to 40 mg daily, take on an empty stomach first thing in the morning and wait 20-30 min before eating.  You may have a prescription for this already. It is important to take omeprazole on an EMPTY stomach and wait 20-30 min before eating/drinking.  Use sucralfate solution 20 min before meals and at bed time. Use zofran as needed for nausea. You can also take [phone] mg acetaminophen (tylenol) throughout the day every 6 hours for abdominal pain.   Avoid irritating foods and liquids such as alcohol, greasy/fatty or acidic foods. Avoid ibuprofen or aspirin containing products. Avoid marijuana.   Please go to your appointment with GI on the 21st  Return to the ER for fever, chills, blood in vomit or stool, worsening localized abdominal pain to right upper or right lower abdomen, inability to tolerate fluids.

## 2021-01-15 NOTE — ED Triage Notes (Signed)
Ems brings pt in from home for abdominal pain. Pt reports the pain started this morning. Denies vomiting or diarrhea.

## 2021-01-22 ENCOUNTER — Other Ambulatory Visit: Payer: Self-pay

## 2021-02-02 ENCOUNTER — Other Ambulatory Visit: Payer: Self-pay

## 2021-02-02 ENCOUNTER — Encounter: Payer: Self-pay | Admitting: Gastroenterology

## 2021-02-02 ENCOUNTER — Ambulatory Visit (INDEPENDENT_AMBULATORY_CARE_PROVIDER_SITE_OTHER): Payer: Self-pay | Admitting: Gastroenterology

## 2021-02-02 VITALS — BP 122/80 | HR 72 | Ht 69.0 in | Wt 169.0 lb

## 2021-02-02 DIAGNOSIS — R110 Nausea: Secondary | ICD-10-CM

## 2021-02-02 DIAGNOSIS — R1011 Right upper quadrant pain: Secondary | ICD-10-CM

## 2021-02-02 NOTE — Patient Instructions (Signed)
If you are age 42 or older, your body mass index should be between 23-30. Your Body mass index is 24.96 kg/m. If this is out of the aforementioned range listed, please consider follow up with your Primary Care Provider.  If you are age 69 or younger, your body mass index should be between 19-25. Your Body mass index is 24.96 kg/m. If this is out of the aformentioned range listed, please consider follow up with your Primary Care Provider.   __________________________________________________________  The [facility] providers would like to encourage you to use [facility] to communicate with providers for non-urgent requests or questions.  Due to long hold times on the telephone, sending your provider a message by [facility] may be a faster and more efficient way to get a response.  Please allow 48 business hours for a response.  Please remember that this is for non-urgent requests.   You have been scheduled for an endoscopy. Please follow written instructions given to you at your visit today. If you use inhalers (even only as needed), please bring them with you on the day of your procedure.  It was a pleasure to see you today!  Thank you for trusting me with your gastrointestinal care!

## 2021-02-02 NOTE — Progress Notes (Signed)
[facility] Consult Note:  History: Matthew Scott 02/02/2021  Referring provider: Storm Frisk, MD  Reason for consult/chief complaint: Abdominal Pain (RUQ pain, had gallbladder out 9 years ago, still has pain daily with nausea.) and Constipation   Subjective  HPI:  This is a 40 year old man referred by primary care for abdominal pain  This patient is visited the ED several times a month in recent months for this abdominal pain.  From a recent note: "Matthew Scott is a 40 y.o. male with history of chronic abdominal pain, marijuana use, cholecystectomy presents to the ER for evaluation of abdominal pain. This has been going on for a long time ever since they took his gallbladder out 8 years ago, but it was severe this morning. "I had to come".  Pain is in epigastric and right upper abdomen. It does not radiate. Sharp. Has had nausea but no vomiting so far. No fever, CP, SOB, diarrhea. Last BM 2 days ago. No dysuria or hematuria, flank pain. He takes 2 pills for his pain but doesn't know the name. He thinks it is protonix and bentyl. These medicine don't help. Pain is usually worse in the morning. Gets better during the day. Can't eat until the evening.  Last night he had oatmeal for dinner. Denies alcohol use. Denies NSAID use. He smokes marijuana daily. States it is the only thing that helps his pain and that is why he keeps doing it.  He has a GI appointment 6/21.   Chart review discovers handwritten upper endoscopy note done for abdominal pain nausea and vomiting (unknown provider, illegible signature) dated April 2011.  Findings unclear.  This patient says he has had constant pain since 2011 before his gallbladder came out.  Sometimes he has nausea but no vomiting.  He does not believe this trouble is caused by marijuana, since it seems to be about the only thing that makes it better.  His appetite is "okay", he thinks he may have lost some weight in recent  years but cannot quantify it.  Denies dysphagia or odynophagia.  Bowel habits are regular without rectal bleeding. Curiously, exercising, particularly sit ups and crunches improve his abdominal pain.  ROS:  Review of Systems  Constitutional:  Positive for fatigue. Negative for appetite change and unexpected weight change.  HENT:  Negative for mouth sores and voice change.   Eyes:  Negative for pain and redness.  Respiratory:  Negative for cough and shortness of breath.   Cardiovascular:  Negative for chest pain and palpitations.  Genitourinary:  Negative for dysuria and hematuria.  Musculoskeletal:  Negative for arthralgias and myalgias.  Skin:  Negative for pallor and rash.  Neurological:  Negative for weakness and headaches.  Hematological:  Negative for adenopathy.    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   Qualifier: Diagnosis of  By: Daphine Deutscher FNP, Nykedtra     Chronic abdominal pain    Cyclical vomiting syndrome    GASTRITIS 12/09/2009   Qualifier: Diagnosis of  By: Daphine Deutscher FNP, Nykedtra     Gastroparesis    HELICOBACTER PYLORI INFECTION 12/14/2009   Qualifier: Diagnosis of  By: Levon Hedger     Nausea and vomiting    chronic, recurrent   Polysubstance abuse [facility])      Past Surgical History: Past Surgical History:  Procedure Laterality Date   CHOLECYSTECTOMY  11/2009     Family History: Family History  Problem Relation Age  of Onset   Other Father    Diabetes Father    Hypertension Father    Pancreatic cancer Father    Colon cancer Neg Hx    Esophageal cancer Neg Hx    Liver cancer Neg Hx     Social History: Social History   Socioeconomic History   Marital status: Single    Spouse name: Not on file   Number of children: Not on file   Years of education: Not on file   Highest education level: Not on file  Occupational History   Occupation: Unemployed, helps people move  Tobacco Use   Smoking  status: Some Days    Pack years: 0.00    Types: Cigars    Last attempt to quit: 12/15/2014    Years since quitting: 6.1   Smokeless tobacco: Never   Tobacco comments:    7 cigars infused with marijuana a day  Vaping Use   Vaping Use: Never used  Substance and Sexual Activity   Alcohol use: No    Alcohol/week: 0.0 standard drinks   Drug use: Yes    Types: Marijuana    Comment: Last used: 4 days ago per patient on 04/30/2018   Sexual activity: Yes  Other Topics Concern   Not on file  Social History Narrative   Single, lives with parents.  Ambulatory.   Social Determinants of Corporate investment banker Strain: Not on file  Food Insecurity: No Food Insecurity   Worried About Programme researcher, broadcasting/film/video in the Last Year: Never true   Barista in the Last Year: Never true  Transportation Needs: Personal assistant (Medical): Yes   Lack of Transportation (Non-Medical): Yes  Physical Activity: Not on file  Stress: Not on file  Social Connections: Not on file    Smokes marijuana daily, says it is the only thing that improves his symptoms  Allergies: No Known Allergies  Outpatient Meds: Current Outpatient Medications  Medication Sig Dispense Refill   dicyclomine (BENTYL) 20 MG tablet Take 1 tablet (20 mg total) by mouth 3 (three) times daily as needed for spasms. 20 tablet 0   ondansetron (ZOFRAN ODT) 4 MG disintegrating tablet Take 1 tablet (4 mg total) by mouth every 8 (eight) hours as needed for nausea or vomiting. 20 tablet 0   promethazine (PHENERGAN) 25 MG tablet Take 1 tablet (25 mg total) by mouth every 6 (six) hours as needed for nausea or vomiting (IF continuing severe nausea, able to swallow tablet (may use as an alternative to zofran)). 30 tablet 0   sucralfate (CARAFATE) 1 GM/10ML suspension Take 10 mLs (1 g total) by mouth 4 (four) times daily -  with meals and at bedtime. 420 mL 0   pantoprazole (PROTONIX) 20 MG tablet Take 2 tablets  (40 mg total) by mouth daily for 14 days. 28 tablet 0   No current facility-administered medications for this visit.      ___________________________________________________________________ Objective   Exam:  BP 122/80   Pulse 72   Ht 5\' 9"  (1.753 m)   Wt 169 lb (76.7 kg)   SpO2 99%   BMI 24.96 kg/m  Wt Readings from Last 3 Encounters:  02/02/21 169 lb (76.7 kg)  11/26/20 171 lb 3.2 oz (77.7 kg)  10/20/20 169 lb (76.7 kg)    General: Well-appearing, no acute distress Eyes: sclera anicteric, no redness ENT: oral mucosa moist without lesions, no cervical or supraclavicular lymphadenopathy CV: RRR  without murmur, S1/S2, no JVD, no peripheral edema Resp: clear to auscultation bilaterally, normal RR and effort noted GI: soft, mild right upper tenderness, with active bowel sounds. No guarding or palpable organomegaly noted.  No bruit Skin; warm and dry, no rash or jaundice noted Neuro: awake, alert and oriented x 3. Normal gross motor function and fluent speech  Labs:  CBC Latest Ref Rng & Units 01/15/2021 01/10/2021 12/15/2020  WBC 4.0 - 10.5 K/uL 6.3 7.9 8.2  Hemoglobin 13.0 - 17.0 g/dL 12.4(L) 12.4(L) 11.4(L)  Hematocrit 39.0 - 52.0 % 38.3(L) 38.5(L) 35.6(L)  Platelets 150 - 400 K/uL 222 212 213   CMP Latest Ref Rng & Units 01/15/2021 01/10/2021 12/15/2020  Glucose 70 - 99 mg/dL 384(T) 98 98  BUN 6 - 20 mg/dL 14 13 11   Creatinine 0.61 - 1.24 mg/dL 3.64 6.80  Sodium 135 - 145 mmol/L 141 141 143  Potassium 3.5 - 5.1 mmol/L 4.0 3.9 4.0  Chloride 98 - 111 mmol/L 110 107 107  CO2 22 - 32 mmol/L 26 26 27   Calcium 8.9 - 10.3 mg/dL 9.4 9.5 9.7  Total Protein 6.5 - 8.1 g/dL 7.1 7.2 7.4  Total Bilirubin 0.3 - 1.2 mg/dL 1.2 1.0 3.21)  Alkaline Phos 38 - 126 U/L 63 59 64  AST 15 - 41 U/L 19 24 31   ALT 0 - 44 U/L 19 22 23      Radiologic Studies:  CLINICAL DATA:  Abdominal pain.   EXAM: CT ABDOMEN AND PELVIS WITH CONTRAST   TECHNIQUE: Multidetector CT imaging of the  abdomen and pelvis was performed using the standard protocol following bolus administration of intravenous contrast.   CONTRAST:  OMNIPAQUE IOHEXOL 300 MG/ML  SOLN   COMPARISON:  Stone study 06/12/2020.   FINDINGS: Lower chest: Unremarkable.   Hepatobiliary: 8 mm hypodensity in the dome of the liver identified previously is stable in the interim, too small to characterize but likely a benign finding such as cyst. Several additional very tiny hypoattenuating liver lesions are also too small to definitively characterize but likely benign. Gallbladder surgically absent. No intrahepatic or extrahepatic biliary dilation.   Pancreas: No focal mass lesion. No dilatation of the main duct. No intraparenchymal cyst. No peripancreatic edema.   Spleen: No splenomegaly. No focal mass lesion.   Adrenals/Urinary Tract: No adrenal nodule or mass. 3 mm nonobstructing stone identified upper pole right kidney 1-2 mm nonobstructing lower interpolar right kidney stones best seen on coronal 95/6. No right ureteral stones. No secondary changes in the right kidney or ureter. No left urinary stone disease. No secondary changes in the left kidney or ureter. The urinary bladder appears normal for the degree of distention.   Stomach/Bowel: Stomach is unremarkable. No gastric wall thickening. No evidence of outlet obstruction. Duodenum is normally positioned as is the ligament of Treitz. No small bowel wall thickening. No small bowel dilatation. Terminal ileum not discretely identified. The appendix is not well visualized, but there is no edema or inflammation in the region of the cecum. No gross colonic mass. No colonic wall thickening.   Vascular/Lymphatic: No abdominal aortic aneurysm. No abdominal lymphadenopathy. No pelvic sidewall lymphadenopathy.   Reproductive: The prostate gland and seminal vesicles are unremarkable.   Other: No intraperitoneal free fluid.   Musculoskeletal: No  worrisome lytic or sclerotic osseous abnormality.   IMPRESSION: 1. No acute findings in the abdomen or pelvis. Specifically, no findings to explain the patient's history of abdominal pain. 2. Tiny nonobstructing right renal stones.  Electronically Signed   By: Kennith CenterEric  Mansell M.D.   On: 09/30/2020 15:40    This patient has over 15 abdominal CT scans dating back to 2011   2 hour gastric emptying study and July 2012: Clinical Data:  Nausea, vomiting   NUCLEAR MEDICINE GASTRIC EMPTYING SCAN   Technique:  After oral ingestion of radiolabeled meal, sequential  abdominal images were obtained for 120 minutes.  Residual  percentage of activity remaining within the stomach was calculated  at 60 and 120 minutes.   Radiopharmaceutical: 2 mCi Tc-1338m sulfur colloid.   Comparison: None.   Findings:Left anerior oblique imaging after the patient ingested  the radiopharmaceutical in scrambled egg p.o. There is 44% residual  gastric activity at 120 minutes (normal less than 30%).   IMPRESSION   Gastric emptying is delayed.   Original Report Authenticated By: Thora Lance. DANIEL HASSELL III, M.D. _____________________________________________  Addendum Dorothea GlassmanBegins   The automatic calculation of the gallbladder ejection fraction was  5.8%. Manual calculation of the gallbladder ejection fraction was  23%.  This is still abnormally low.  The patient moved during the  examination which may make the automatic calculation of the  ejection fraction less accurate.     Addendum Ends   Clinical Data: Abdominal pain    NUCLEAR MEDICINE HEPATOBILIARY IMAGING WITH GALLBLADDER EF    Technique: Sequential images of the abdomen were obtained out to 60  minutes following intravenous administration of  radiopharmaceutical. After slow intravenous infusion of 1.78 uCg  Cholecystokinin, gallbladder ejection fraction was determined.    Radiopharmaceutical: 5.2 mCi Tc-4138m Choletec    Comparison: CT  11/18/2009    Findings: There is prompt visualization of hepatic activity. There  is prompt visualization of the common bile duct. Subsequently  intestinal activity was identified.    The gallbladder began to visualize at 17 minutes.    During CCK infusion the gallbladder contracted 5.8%.  This is  abnormally low. 30% or greater is normal range.    During CCK infusion the patient reported .    IMPRESSION:  There is demonstration of patency of the common bile duct and the  cystic duct. There is no evidence of cholecystitis.    During CCK infusion the gallbladder contracted 5.8%.  This is  abnormally low. 30% or greater is normal range.    During CCK infusion the patient reported experiencing abdominal  pain and nausea during the entire examination as well as vomiting.  The patient reported that he was not sure that the CCK made the  symptoms worse .   Provider: Hannah BeatMelinda Berry, Victorino DecemberAmber Pitts, McLainShanin Stedge    HIDA scan on 11/24/2009 (postop) with no bile leak  Assessment: Encounter Diagnoses  Name Primary?   RUQ abdominal pain Yes   Nausea in adult     Records indicate this pain has been going on since least 2011, and seems to been the indication for cholecystectomy based on his abnormal HIDA scan at that time.  It looks like he had an upper endoscopy, I cannot see with the findings were on a handwritten note.  This appears to have been done as work-up for the pain prior to considering surgery. Thus, this pain was not gallbladder in nature, and it is difficult to say if there is a primary GI condition causing this.  Mildly decreased gastric emptying on 2-hour study is of unclear significance (current normal range 40% emptying at 2 hours), and the degree of his pain without vomiting are not typical for delayed  gastric emptying.  Extensive lab and imaging work-up over many years unrevealing for source of this pain. This patient is a high utilizer of emergency services, which often  raises concern that there may be some secondary gain.  I believe there is likely to be a significant functional component to this pain.  Plan:  I offered him an upper endoscopy.  Because he is uninsured and does not have any [place] specific coverage, we gave him the required financial disclosure related to the costs. Procedure risks and benefits reviewed and he was agreeable  The benefits and risks of the planned procedure were described in detail with the patient or (when appropriate) their health care proxy.  Risks were outlined as including, but not limited to, bleeding, infection, perforation, adverse medication reaction leading to cardiac or pulmonary decompensation, pancreatitis (if ERCP).  The limitation of incomplete mucosal visualization was also discussed.  No guarantees or warranties were given.  He signed the financial documents and procedure consent.  I have no other treatment recommendations at this point, as acid suppression and antispasmodic therapy not been helpful.   Charlie Pitter III  CC: Referring provider noted above

## 2021-03-02 ENCOUNTER — Other Ambulatory Visit: Payer: Self-pay

## 2021-03-02 ENCOUNTER — Ambulatory Visit: Payer: Self-pay | Admitting: Gastroenterology

## 2021-03-02 NOTE — Progress Notes (Signed)
Patient arrived for today's scheduled upper endoscopy and reports he drank water at 11:30 am and ate a sausage biscuit at 9:30.   Per MD and CRNA This will cause a delay until 5:30 pm before the procedure could be started.  The patient was informed of the delay and was offered an opportunity to reschedule this procedure at next available appointment and the patient chooses not to reschedule at this time.  MD and CRNA notified.

## 2021-03-03 ENCOUNTER — Encounter: Payer: Self-pay | Admitting: Gastroenterology

## 2021-03-15 ENCOUNTER — Encounter: Payer: Self-pay | Admitting: Gastroenterology

## 2021-03-16 ENCOUNTER — Encounter: Payer: Self-pay | Admitting: Gastroenterology

## 2021-03-27 ENCOUNTER — Encounter (HOSPITAL_COMMUNITY): Payer: Self-pay

## 2021-03-27 ENCOUNTER — Emergency Department (HOSPITAL_COMMUNITY)
Admission: EM | Admit: 2021-03-27 | Discharge: 2021-03-27 | Disposition: A | Discharge status: Home / Self Care | Payer: Self-pay | Attending: Emergency Medicine | Admitting: Emergency Medicine

## 2021-03-27 ENCOUNTER — Other Ambulatory Visit: Payer: Self-pay

## 2021-03-27 DIAGNOSIS — F17290 Nicotine dependence, other tobacco product, uncomplicated: Secondary | ICD-10-CM | POA: Insufficient documentation

## 2021-03-27 DIAGNOSIS — I10 Essential (primary) hypertension: Secondary | ICD-10-CM | POA: Insufficient documentation

## 2021-03-27 DIAGNOSIS — R1031 Right lower quadrant pain: Secondary | ICD-10-CM | POA: Insufficient documentation

## 2021-03-27 LAB — COMPREHENSIVE METABOLIC PANEL
ALT: 19 U/L (ref 0–44)
AST: 25 U/L (ref 15–41)
Albumin: 3.9 g/dL (ref 3.5–5.0)
Alkaline Phosphatase: 60 U/L (ref 38–126)
Anion gap: 8 (ref 5–15)
BUN: 10 mg/dL (ref 6–20)
CO2: 27 mmol/L (ref 22–32)
Calcium: 9.4 mg/dL (ref 8.9–10.3)
Chloride: 106 mmol/L (ref 98–111)
Creatinine, Ser: 0.86 mg/dL (ref 0.61–1.24)
GFR, Estimated: >60 mL/min (ref >60)
Glucose, Bld: 94 mg/dL (ref 70–99)
Potassium: 3.8 mmol/L (ref 3.5–5.1)
Sodium: 141 mmol/L (ref 135–145)
Total Bilirubin: 2 mg/dL — ABNORMAL HIGH (ref 0.3–1.2)
Total Protein: 6.9 g/dL (ref 6.5–8.1)

## 2021-03-27 LAB — CBC WITH DIFFERENTIAL/PLATELET
Abs Immature Granulocytes: 0.02 10*3/uL (ref 0.00–0.07)
Basophils Absolute: 0 10*3/uL (ref 0.0–0.1)
Basophils Relative: 0 %
Eosinophils Absolute: 0 10*3/uL (ref 0.0–0.5)
Eosinophils Relative: 0 %
HCT: 35.7 % — ABNORMAL LOW (ref 39.0–52.0)
Hemoglobin: 11.7 g/dL — ABNORMAL LOW (ref 13.0–17.0)
Immature Granulocytes: 0 %
Lymphocytes Relative: 23 %
Lymphs Abs: 1.9 10*3/uL (ref 0.7–4.0)
MCH: 33.1 pg (ref 26.0–34.0)
MCHC: 32.8 g/dL (ref 30.0–36.0)
MCV: 101.1 fL — ABNORMAL HIGH (ref 80.0–100.0)
Monocytes Absolute: 0.6 10*3/uL (ref 0.1–1.0)
Monocytes Relative: 7 %
Neutro Abs: 5.7 10*3/uL (ref 1.7–7.7)
Neutrophils Relative %: 70 %
Platelets: 216 10*3/uL (ref 150–400)
RBC: 3.53 MIL/uL — ABNORMAL LOW (ref 4.22–5.81)
RDW: 12.4 % (ref 11.5–15.5)
WBC: 8.4 10*3/uL (ref 4.0–10.5)
nRBC: 0 % (ref 0.0–0.2)

## 2021-03-27 LAB — LIPASE, BLOOD: Lipase: 22 U/L (ref 11–51)

## 2021-03-27 MED ORDER — SODIUM CHLORIDE 0.9 % IV BOLUS
1000.0000 mL | Freq: Once | INTRAVENOUS | Status: AC
  Administered 2021-03-27: 1000 mL via INTRAVENOUS

## 2021-03-27 MED ORDER — OXYCODONE-ACETAMINOPHEN 5-325 MG PO TABS
2.0000 | ORAL_TABLET | Freq: Once | ORAL | Status: AC
  Administered 2021-03-27: 2 via ORAL
  Filled 2021-03-27: qty 2

## 2021-03-27 NOTE — ED Triage Notes (Signed)
EMS reports from home, c/o lower right abdominal pain since last night. Pt states seen for same and DX with cannabinoid hyperemesis but does not believe that is what is wrong.  126/72 HR 70 RR 20 Sp02 100 RA CBG 120  18ga LAC  Fentanyl enroute.

## 2021-03-27 NOTE — ED Provider Notes (Signed)
[facility]-EMERGENCY DEPT Provider Note   CSN: [id number] Arrival date & time: 03/27/21  0913     History Chief Complaint  Patient presents with   Abdominal Pain    Matthew Scott is a 40 y.o. male.  Matthew Scott complains of acute on chronic right-sided abdominal pain.  This started 11 years ago after cholecystectomy, and he has had numerous emergency department evaluations since that time.  He has also seen lumbar GI and was scheduled for endoscopy last month.  Unfortunately, he had oral intake that prohibited his having the procedure.  He states that this is rescheduled for September.  He had a recurrence of a pain exacerbation yesterday, and it persisted this morning.  EMS was called, and he states that he did receive some mild relief from fentanyl.  Pain is typical of his chronic pain.  He states that it is usually helped by marijuana, a hot pack, and Percocet.  The history is provided by the patient.  Abdominal Pain Pain location:  RLQ Pain quality: sharp   Pain radiates to:  Does not radiate Pain severity:  Severe Onset quality:  Sudden Duration:  1 day Timing:  Constant Progression:  Worsening Chronicity:  New Context comment:  Chronic pain x11 years Relieved by:  Nothing Worsened by:  Nothing Ineffective treatments: marijuana. 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   Qualifier: Diagnosis of  By: Daphine Deutscher FNP, Nykedtra     Chronic abdominal pain    Cyclical vomiting syndrome    GASTRITIS 12/09/2009   Qualifier: Diagnosis of  By: Daphine Deutscher FNP, Nykedtra     Gastroparesis    HELICOBACTER PYLORI INFECTION 12/14/2009   Qualifier: Diagnosis of  By: Levon Hedger     Nausea and vomiting    chronic,  recurrent   Polysubstance abuse (HCC)     Patient Active Problem List   Diagnosis Date Noted   Anemia 03/10/2016   Essential hypertension 12/22/2015   GERD (gastroesophageal reflux disease) 01/21/2015   Gastritis 12/19/2014   Alcohol abuse 12/14/2014   Gastroparesis 05/11/2012   Abdominal pain 05/06/2012   Polysubstance abuse (HCC) 05/06/2012   CHOLECYSTECTOMY, HX OF 11/25/2009    Past Surgical History:  Procedure Laterality Date   CHOLECYSTECTOMY  11/2009       Family History  Problem Relation Age of Onset   Other Father    Diabetes Father    Hypertension Father    Pancreatic cancer Father    Colon cancer Neg Hx    Esophageal cancer Neg Hx    Liver cancer Neg Hx     Social History   Tobacco Use   Smoking status: Some Days    Types: Cigars    Last attempt to quit: 12/15/2014    Years since quitting: 6.2   Smokeless tobacco: Never   Tobacco comments:    7 cigars infused with marijuana a day  Vaping Use   Vaping Use: Never used  Substance Use Topics   Alcohol use: No    Alcohol/week: 0.0 standard drinks   Drug use: Yes    Types: Marijuana    Comment: Last used: 4 days ago per patient on 04/30/2018    Home Medications Prior to Admission medications  Medication Sig Start Date End Date Taking? Authorizing Provider  dicyclomine (BENTYL) 20 MG tablet Take 1 tablet (20 mg total) by mouth 3 (three) times daily as needed for spasms. 12/15/20   Long, Arlyss Repress, MD  ondansetron (ZOFRAN ODT) 4 MG disintegrating tablet Take 1 tablet (4 mg total) by mouth every 8 (eight) hours as needed for nausea or vomiting. 01/15/21   Liberty Handy, PA-C  pantoprazole (PROTONIX) 20 MG tablet Take 2 tablets (40 mg total) by mouth daily for 14 days. 12/09/20 12/23/20  Alvira Monday, MD  promethazine (PHENERGAN) 25 MG tablet Take 1 tablet (25 mg total) by mouth every 6 (six) hours as needed for nausea or vomiting (IF continuing severe nausea, able to swallow tablet (may use as an  alternative to zofran)). 12/09/20   Alvira Monday, MD  sucralfate (CARAFATE) 1 GM/10ML suspension Take 10 mLs (1 g total) by mouth 4 (four) times daily -  with meals and at bedtime. 01/15/21   Liberty Handy, PA-C    Allergies    Patient has no known allergies.  Review of Systems   Review of Systems  Constitutional:  Negative for chills and fever.  HENT:  Negative for ear pain and sore throat.   Eyes:  Negative for pain and visual disturbance.  Respiratory:  Negative for cough and shortness of breath.   Cardiovascular:  Negative for chest pain and palpitations.  Gastrointestinal:  Positive for abdominal pain and nausea. Negative for constipation, diarrhea and vomiting.  Genitourinary:  Negative for dysuria and hematuria.  Musculoskeletal:  Negative for arthralgias and back pain.  Skin:  Negative for color change and rash.  Neurological:  Negative for seizures and syncope.  All other systems reviewed and are negative.  Physical Exam Updated Vital Signs BP (!) 143/83   Pulse 65   Temp 97.9 F (36.6 C) (Oral)   Resp 18   SpO2 100%   Physical Exam Vitals and nursing note reviewed.  Constitutional:      Appearance: Normal appearance.     Comments: Writhing and moaning  HENT:     Head: Normocephalic and atraumatic.  Eyes:     Conjunctiva/sclera: Conjunctivae normal.  Pulmonary:     Effort: Pulmonary effort is normal. No respiratory distress.  Abdominal:     General: Abdomen is flat.     Palpations: Abdomen is soft.     Tenderness: There is no abdominal tenderness. There is no guarding or rebound.     Comments: Deep palpation was possible without focal tenderness  Musculoskeletal:        General: No deformity. Normal range of motion.     Cervical back: Normal range of motion.  Skin:    General: Skin is warm and dry.  Neurological:     General: No focal deficit present.     Mental Status: He is alert and oriented to person, place, and time. Mental status is at  baseline.  Psychiatric:        Mood and Affect: Mood normal.    ED Results / Procedures / Treatments   Labs (all labs ordered are listed, but only abnormal results are displayed) Labs Reviewed  COMPREHENSIVE METABOLIC PANEL - Abnormal; Notable for the following components:      Result Value   Total Bilirubin 2.0 (*)    All other components within normal limits  CBC WITH DIFFERENTIAL/PLATELET - Abnormal; Notable for the following components:   RBC 3.53 (*)    Hemoglobin 11.7 (*)  HCT 35.7 (*)    MCV 101.1 (*)    All other components within normal limits  LIPASE, BLOOD    EKG None  Radiology No results found.  Procedures Procedures   Medications Ordered in ED Medications  sodium chloride 0.9 % bolus 1,000 mL (has no administration in time range)  oxyCODONE-acetaminophen (PERCOCET/ROXICET) 5-325 MG per tablet 2 tablet (has no administration in time range)    ED Course  I have reviewed the triage vital signs and the nursing notes.  Pertinent labs & imaging results that were available during my care of the patient were reviewed by me and considered in my medical decision making (see chart for details).  Clinical Course as of 03/27/21 1230  Sat Mar 27, 2021  1230 I went to check on the patient, and he is on the floor doing push-ups.  He states that his pain is better. [AW]    Clinical Course User Index [AW] Koleen Distance, MD   MDM Rules/Calculators/A&P                           Matthew Scott presents with acute exacerbation of his relatively chronic abdominal pain.  He is currently symptomatically improved and was instructed to follow-up with his primary care doctor and GI as scheduled for his endoscopy.  ED evaluation revealed some mild anemia which is similar to his priors.  Again, he is scheduled for endoscopy in the next few weeks, and I think this will be important to evaluate for gastritis or other source of his mild anemia.  He had a slightly elevated  bilirubin without otherwise having elevation in his liver enzymes.  Abdominal exam did not suggest retained common bile duct stone, and he has had this pain chronically for 11 years.  If anemia is not attributed to his gastrointestinal pathology, hematology/oncology follow-up could be considered.  However, he is much better and will be discharged home today. Final Clinical Impression(s) / ED Diagnoses Final diagnoses:  Right lower quadrant abdominal pain    Rx / DC Orders ED Discharge Orders     None        Koleen Distance, MD 03/27/21 1232

## 2021-03-30 ENCOUNTER — Other Ambulatory Visit: Payer: Self-pay

## 2021-03-30 ENCOUNTER — Emergency Department (HOSPITAL_COMMUNITY)
Admission: EM | Admit: 2021-03-30 | Discharge: 2021-03-30 | Disposition: A | Discharge status: Home / Self Care | Payer: Self-pay | Attending: Emergency Medicine | Admitting: Emergency Medicine

## 2021-03-30 ENCOUNTER — Encounter (HOSPITAL_COMMUNITY): Payer: Self-pay

## 2021-03-30 DIAGNOSIS — G8929 Other chronic pain: Secondary | ICD-10-CM | POA: Insufficient documentation

## 2021-03-30 DIAGNOSIS — Z5321 Procedure and treatment not carried out due to patient leaving prior to being seen by health care provider: Secondary | ICD-10-CM | POA: Insufficient documentation

## 2021-03-30 DIAGNOSIS — R112 Nausea with vomiting, unspecified: Secondary | ICD-10-CM | POA: Insufficient documentation

## 2021-03-30 DIAGNOSIS — R1084 Generalized abdominal pain: Secondary | ICD-10-CM | POA: Insufficient documentation

## 2021-03-30 DIAGNOSIS — R197 Diarrhea, unspecified: Secondary | ICD-10-CM | POA: Insufficient documentation

## 2021-03-30 DIAGNOSIS — R1010 Upper abdominal pain, unspecified: Secondary | ICD-10-CM | POA: Insufficient documentation

## 2021-03-30 DIAGNOSIS — R109 Unspecified abdominal pain: Secondary | ICD-10-CM

## 2021-03-30 LAB — URINALYSIS, ROUTINE W REFLEX MICROSCOPIC
Bacteria, UA: NONE SEEN
Bilirubin Urine: NEGATIVE
Glucose, UA: NEGATIVE mg/dL
Hgb urine dipstick: NEGATIVE
Ketones, ur: 5 mg/dL — AB
Leukocytes,Ua: NEGATIVE
Nitrite: NEGATIVE
Protein, ur: 30 mg/dL — AB
Specific Gravity, Urine: 1.026 (ref 1.005–1.030)
pH: 5 (ref 5.0–8.0)

## 2021-03-30 LAB — CBC WITH DIFFERENTIAL/PLATELET
Abs Immature Granulocytes: 0.01 10*3/uL (ref 0.00–0.07)
Basophils Absolute: 0 10*3/uL (ref 0.0–0.1)
Basophils Relative: 0 %
Eosinophils Absolute: 0 10*3/uL (ref 0.0–0.5)
Eosinophils Relative: 0 %
HCT: 38.4 % — ABNORMAL LOW (ref 39.0–52.0)
Hemoglobin: 12.6 g/dL — ABNORMAL LOW (ref 13.0–17.0)
Immature Granulocytes: 0 %
Lymphocytes Relative: 14 %
Lymphs Abs: 1.1 10*3/uL (ref 0.7–4.0)
MCH: 33.2 pg (ref 26.0–34.0)
MCHC: 32.8 g/dL (ref 30.0–36.0)
MCV: 101.1 fL — ABNORMAL HIGH (ref 80.0–100.0)
Monocytes Absolute: 0.4 10*3/uL (ref 0.1–1.0)
Monocytes Relative: 5 %
Neutro Abs: 6.4 10*3/uL (ref 1.7–7.7)
Neutrophils Relative %: 81 %
Platelets: 228 10*3/uL (ref 150–400)
RBC: 3.8 MIL/uL — ABNORMAL LOW (ref 4.22–5.81)
RDW: 12.2 % (ref 11.5–15.5)
WBC: 8 10*3/uL (ref 4.0–10.5)
nRBC: 0 % (ref 0.0–0.2)

## 2021-03-30 LAB — COMPREHENSIVE METABOLIC PANEL
ALT: 21 U/L (ref 0–44)
AST: 24 U/L (ref 15–41)
Albumin: 4.2 g/dL (ref 3.5–5.0)
Alkaline Phosphatase: 66 U/L (ref 38–126)
Anion gap: 10 (ref 5–15)
BUN: 8 mg/dL (ref 6–20)
CO2: 26 mmol/L (ref 22–32)
Calcium: 9.4 mg/dL (ref 8.9–10.3)
Chloride: 104 mmol/L (ref 98–111)
Creatinine, Ser: 0.9 mg/dL (ref 0.61–1.24)
GFR, Estimated: >60 mL/min (ref >60)
Glucose, Bld: 93 mg/dL (ref 70–99)
Potassium: 3.5 mmol/L (ref 3.5–5.1)
Sodium: 140 mmol/L (ref 135–145)
Total Bilirubin: 1.4 mg/dL — ABNORMAL HIGH (ref 0.3–1.2)
Total Protein: 7.2 g/dL (ref 6.5–8.1)

## 2021-03-30 LAB — LIPASE, BLOOD: Lipase: 24 U/L (ref 11–51)

## 2021-03-30 NOTE — ED Notes (Signed)
This nurse was notified by the ED tech that he entered the pt's room and the pt was c/o his IV bothering him and attempted to pull the IV out with the tech witnessing. This nurse entered the pt's room and the pt began shouting at her "in the 11 years I been comin' here I ain't never had this here" referring to the 18g IV in his right bicep placed by this nurse. Pt then yelled "what the hell?!" This nurse stated to the pt that the IV was flushing and drawing fine - as she had just evaluated its patency, and that she would have to stick the pt again if he pulled the IV out. This nurse then told the pt to leave the IV in his arm and exited the exam room.

## 2021-03-30 NOTE — ED Triage Notes (Signed)
Per EMS-coming from home-history of chronic abdominal pain-started 11 years ago since he got his gallbladder removed-CBG 109-vomiting bile this am-has a GI MD-has a procedure in September to find out what is going on

## 2021-03-30 NOTE — ED Provider Notes (Signed)
Emergency Medicine Provider Triage Evaluation Note  DEVELL PARKERSON , a 40 y.o. male  was evaluated in triage.  Pt complains of abdominal pain. Reports chronic to upper abdomen, usually on the right- more so to the left this AM, but also is generalized. Associated N/V, at times diarrhea as well.   Review of Systems  Positive: Abdominal pain, N/V/D Negative: Dysuria, hematuria  Physical Exam  BP (!) 144/77 (BP Location: Left Arm)   Pulse 70   Temp 98.3 F (36.8 C) (Oral)   Resp 16   SpO2 97%  Gen:   Awake, alert  Resp:  Normal effort  MSK:   Moves extremities without difficulty  Other:  Generalized abdominal tenderness.   Medical Decision Making  Medically screening exam initiated at 12:26 PM.  Appropriate orders placed.  LEQUAN DOBRATZ was informed that the remainder of the evaluation will be completed by another provider, this initial triage assessment does not replace that evaluation, and the importance of remaining in the ED until their evaluation is complete.  Abdominal pain.    Desmond Lope 03/30/21 1230    Derwood Kaplan, MD 03/30/21 1719

## 2021-03-30 NOTE — ED Notes (Signed)
PA-C at the bedside.  ?

## 2021-03-30 NOTE — ED Notes (Addendum)
This nurse was notified by the Maudie Flakes, that the pt was not in the exam room. This nurse notified Charge Nurse Jerilee Hoh, RN. Pt exited dept with 18g IV in bicep. ED provider Dr. Freida Busman also notified and aware.

## 2021-04-05 ENCOUNTER — Other Ambulatory Visit: Payer: Self-pay

## 2021-04-05 ENCOUNTER — Emergency Department (HOSPITAL_COMMUNITY)
Admission: EM | Admit: 2021-04-05 | Discharge: 2021-04-05 | Disposition: A | Discharge status: Home / Self Care | Payer: Self-pay | Attending: Emergency Medicine | Admitting: Emergency Medicine

## 2021-04-05 ENCOUNTER — Encounter (HOSPITAL_COMMUNITY): Payer: Self-pay

## 2021-04-05 DIAGNOSIS — K219 Gastro-esophageal reflux disease without esophagitis: Secondary | ICD-10-CM | POA: Insufficient documentation

## 2021-04-05 DIAGNOSIS — F17290 Nicotine dependence, other tobacco product, uncomplicated: Secondary | ICD-10-CM | POA: Insufficient documentation

## 2021-04-05 DIAGNOSIS — R112 Nausea with vomiting, unspecified: Secondary | ICD-10-CM

## 2021-04-05 DIAGNOSIS — I10 Essential (primary) hypertension: Secondary | ICD-10-CM | POA: Insufficient documentation

## 2021-04-05 DIAGNOSIS — F1290 Cannabis use, unspecified, uncomplicated: Secondary | ICD-10-CM

## 2021-04-05 DIAGNOSIS — F12188 Cannabis abuse with other cannabis-induced disorder: Secondary | ICD-10-CM | POA: Insufficient documentation

## 2021-04-05 LAB — CBC
HCT: 40.8 % (ref 39.0–52.0)
Hemoglobin: 13.1 g/dL (ref 13.0–17.0)
MCH: 33.2 pg (ref 26.0–34.0)
MCHC: 32.1 g/dL (ref 30.0–36.0)
MCV: 103.6 fL — ABNORMAL HIGH (ref 80.0–100.0)
Platelets: 250 10*3/uL (ref 150–400)
RBC: 3.94 MIL/uL — ABNORMAL LOW (ref 4.22–5.81)
RDW: 12 % (ref 11.5–15.5)
WBC: 12.4 10*3/uL — ABNORMAL HIGH (ref 4.0–10.5)
nRBC: 0 % (ref 0.0–0.2)

## 2021-04-05 LAB — COMPREHENSIVE METABOLIC PANEL
ALT: 22 U/L (ref 0–44)
AST: 25 U/L (ref 15–41)
Albumin: 4.2 g/dL (ref 3.5–5.0)
Alkaline Phosphatase: 69 U/L (ref 38–126)
Anion gap: 8 (ref 5–15)
BUN: 10 mg/dL (ref 6–20)
CO2: 27 mmol/L (ref 22–32)
Calcium: 10 mg/dL (ref 8.9–10.3)
Chloride: 108 mmol/L (ref 98–111)
Creatinine, Ser: 0.95 mg/dL (ref 0.61–1.24)
GFR, Estimated: >60 mL/min (ref >60)
Glucose, Bld: 101 mg/dL — ABNORMAL HIGH (ref 70–99)
Potassium: 4.4 mmol/L (ref 3.5–5.1)
Sodium: 143 mmol/L (ref 135–145)
Total Bilirubin: 1.6 mg/dL — ABNORMAL HIGH (ref 0.3–1.2)
Total Protein: 7.5 g/dL (ref 6.5–8.1)

## 2021-04-05 LAB — LIPASE, BLOOD: Lipase: 27 U/L (ref 11–51)

## 2021-04-05 MED ORDER — DROPERIDOL 2.5 MG/ML IJ SOLN
2.5000 mg | Freq: Once | INTRAMUSCULAR | Status: AC
  Administered 2021-04-05: 2.5 mg via INTRAMUSCULAR
  Filled 2021-04-05: qty 2

## 2021-04-05 NOTE — ED Provider Notes (Signed)
[facility] EMERGENCY DEPARTMENT Provider Note   CSN: [id number] Arrival date & time: 04/05/21  1120     History CC - Abd pain  Matthew Scott is a 40 y.o. male.  HPI  40 year old male with a past medical history of status postcholecystectomy, cyclical vomiting syndrome, cannabinoid hyperemesis syndrome presenting to the emergency department with abdominal pain x11 year.  Patient reports that his current flare has been going on since this morning.  His pain is been present for the past 11 years ever since his cholecystectomy.  He states that he last used marijuana last night.  He reports nausea and several episodes of nonbloody nonbilious emesis.  He denies any diarrhea.  He denies any fever.  He denies any recent travel.  He denies any weight loss.  He denies any blood in his vomit.  He states that this pain is exactly like his typical flares.  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   Qualifier: Diagnosis of  By: Daphine Deutscher FNP, Nykedtra     Chronic abdominal pain    Cyclical vomiting syndrome    GASTRITIS 12/09/2009   Qualifier: Diagnosis of  By: Daphine Deutscher FNP, Nykedtra     Gastroparesis    HELICOBACTER PYLORI INFECTION 12/14/2009   Qualifier: Diagnosis of  By: Levon Hedger     Nausea and vomiting    chronic, recurrent   Polysubstance abuse [facility])     Patient Active Problem List   Diagnosis Date Noted   Anemia 03/10/2016   Essential hypertension 12/22/2015   GERD (gastroesophageal reflux disease) 01/21/2015   Gastritis 12/19/2014   Alcohol abuse 12/14/2014   Gastroparesis 05/11/2012   Abdominal pain 05/06/2012   Polysubstance abuse (HCC) 05/06/2012   CHOLECYSTECTOMY, HX OF 11/25/2009    Past Surgical History:  Procedure Laterality Date   CHOLECYSTECTOMY  11/2009       Family History  Problem Relation Age of Onset   Other Father    Diabetes Father    Hypertension Father    Pancreatic cancer Father    Colon cancer Neg  Hx    Esophageal cancer Neg Hx    Liver cancer Neg Hx     Social History   Tobacco Use   Smoking status: Some Days    Types: Cigars    Last attempt to quit: 12/15/2014    Years since quitting: 6.3   Smokeless tobacco: Never   Tobacco comments:    7 cigars infused with marijuana a day  Vaping Use   Vaping Use: Never used  Substance Use Topics   Alcohol use: No    Alcohol/week: 0.0 standard drinks   Drug use: Yes    Types: Marijuana    Comment: Last used: 4 days ago per patient on 04/30/2018    Home Medications Prior to Admission medications   Medication Sig Start Date End Date Taking? Authorizing Provider  dicyclomine (BENTYL) 20 MG tablet Take 1 tablet (20 mg total) by mouth 3 (three) times daily as needed for spasms. 12/15/20   Long, Arlyss Repress, MD  ondansetron (ZOFRAN ODT) 4 MG disintegrating tablet Take 1 tablet (4 mg total) by mouth every 8 (eight) hours as needed for nausea or vomiting. 01/15/21   Liberty Handy, PA-C  pantoprazole (PROTONIX) 20 MG tablet Take 2 tablets (40 mg total) by mouth daily for 14 days. 12/09/20 12/23/20  Alvira Monday, MD  promethazine (PHENERGAN) 25 MG tablet Take 1 tablet (25 mg total) by mouth every  6 (six) hours as needed for nausea or vomiting (IF continuing severe nausea, able to swallow tablet (may use as an alternative to zofran)). 12/09/20   Alvira Monday, MD  sucralfate (CARAFATE) 1 GM/10ML suspension Take 10 mLs (1 g total) by mouth 4 (four) times daily -  with meals and at bedtime. 01/15/21   Liberty Handy, PA-C    Allergies    Patient has no known allergies.  Review of Systems   Review of Systems  Constitutional:  Negative for chills and fever.  HENT:  Negative for ear pain and sore throat.   Eyes:  Negative for pain and visual disturbance.  Respiratory:  Negative for cough and shortness of breath.   Cardiovascular:  Negative for chest pain and palpitations.  Gastrointestinal:  Positive for abdominal pain, nausea and vomiting.   Genitourinary:  Negative for dysuria and hematuria.  Musculoskeletal:  Negative for arthralgias and back pain.  Skin:  Negative for color change and rash.  Neurological:  Negative for seizures and syncope.  All other systems reviewed and are negative.  Physical Exam Updated Vital Signs BP 125/83 (BP Location: Right Arm)   Pulse 79   Temp 98.6 F (37 C) (Oral)   Resp 18   SpO2 96%   Physical Exam Vitals and nursing note reviewed.  Constitutional:      General: He is not in acute distress.    Appearance: Normal appearance. He is well-developed and normal weight. He is not ill-appearing or toxic-appearing.  HENT:     Head: Normocephalic and atraumatic.  Eyes:     Conjunctiva/sclera: Conjunctivae normal.  Cardiovascular:     Rate and Rhythm: Normal rate and regular rhythm.  Pulmonary:     Effort: Pulmonary effort is normal. No respiratory distress.     Breath sounds: Normal breath sounds. No stridor. No wheezing, rhonchi or rales.  Abdominal:     Palpations: Abdomen is soft.     Tenderness: There is abdominal tenderness (generalized). There is no guarding or rebound.  Musculoskeletal:     Cervical back: Neck supple. No tenderness.  Skin:    General: Skin is warm and dry.     Capillary Refill: Capillary refill takes less than 2 seconds.  Neurological:     Mental Status: He is alert.    ED Results / Procedures / Treatments   Labs (all labs ordered are listed, but only abnormal results are displayed) Labs Reviewed  COMPREHENSIVE METABOLIC PANEL - Abnormal; Notable for the following components:      Result Value   Glucose, Bld 101 (*)    Total Bilirubin 1.6 (*)    All other components within normal limits  CBC - Abnormal; Notable for the following components:   WBC 12.4 (*)    RBC 3.94 (*)    MCV 103.6 (*)    All other components within normal limits  LIPASE, BLOOD    EKG None  Radiology No results found.  Procedures Procedures   Medications Ordered in  ED Medications  droperidol (INAPSINE) 2.5 MG/ML injection 2.5 mg (2.5 mg Intramuscular Given 04/05/21 1426)    ED Course  I have reviewed the triage vital signs and the nursing notes.  Pertinent labs & imaging results that were available during my care of the patient were reviewed by me and considered in my medical decision making (see chart for details).    MDM Rules/Calculators/A&P  40 year old male with cholecystectomy and cannabinoid hyperemesis syndrome presenting with generalized abdominal pain and vomiting.  He last used marijuana last night.  Vital signs reviewed, within acceptable limits.  Physical exam is notable for some generalized abdominal tenderness but overall well-appearing male.  His weight appears normal.  Differential includes cyclical vomiting syndrome and cannabinoid hyperemesis syndrome.  Given his benign abdominal exam, repeat presentations, normal imaging the past I believe it is less likely that he has appendicitis, cholecystitis, or diverticulitis.  Seems less likely that he has a retained stone as his surgery was over 11 years ago.  Will provide symptom management here in the emergency department.  Labs obtained in triage reviewed, no significant electrolyte derangement or metabolic pathology.   Given droperidol for symptoms earlier.  On my reevaluation, patient sleeping comfortably.  He is completely asymptomatic at this point and states that he feels ready to go home.  Strict return precautions discussed.  Close primary care and gastroenterology follow-up encouraged.  Also strongly encouraged marijuana cessation, as it seems likely this is contributing to his symptoms at least in some part.  Final Clinical Impression(s) / ED Diagnoses Final diagnoses:  Cannabinoid hyperemesis syndrome    Rx / DC Orders ED Discharge Orders     None        Lenard Lance, MD 04/05/21 1533    Melene Plan, DO 04/06/21 [phone]

## 2021-04-05 NOTE — ED Triage Notes (Signed)
Patient arrived by [facility] with complaint of abdominal pain x 11 years. States that he comes often for same. No associated symptoms

## 2021-04-05 NOTE — ED Notes (Signed)
Went to discharge pt and pt is no longer in the department. PT stable upon departure

## 2021-05-16 ENCOUNTER — Other Ambulatory Visit: Payer: Self-pay

## 2021-05-16 ENCOUNTER — Emergency Department (HOSPITAL_COMMUNITY)
Admission: EM | Admit: 2021-05-16 | Discharge: 2021-05-16 | Disposition: A | Discharge status: Home / Self Care | Payer: Self-pay | Attending: Emergency Medicine | Admitting: Emergency Medicine

## 2021-05-16 ENCOUNTER — Encounter (HOSPITAL_COMMUNITY): Payer: Self-pay | Admitting: Emergency Medicine

## 2021-05-16 DIAGNOSIS — F17290 Nicotine dependence, other tobacco product, uncomplicated: Secondary | ICD-10-CM | POA: Insufficient documentation

## 2021-05-16 DIAGNOSIS — I10 Essential (primary) hypertension: Secondary | ICD-10-CM | POA: Insufficient documentation

## 2021-05-16 DIAGNOSIS — K0381 Cracked tooth: Secondary | ICD-10-CM | POA: Insufficient documentation

## 2021-05-16 DIAGNOSIS — K0889 Other specified disorders of teeth and supporting structures: Secondary | ICD-10-CM

## 2021-05-16 MED ORDER — OXYCODONE-ACETAMINOPHEN 5-325 MG PO TABS
1.0000 | ORAL_TABLET | Freq: Once | ORAL | Status: AC
  Administered 2021-05-16: 1 via ORAL
  Filled 2021-05-16: qty 1

## 2021-05-16 MED ORDER — AMOXICILLIN-POT CLAVULANATE 875-125 MG PO TABS
1.0000 | ORAL_TABLET | Freq: Two times a day (BID) | ORAL | 0 refills | Status: DC
  Filled 2021-05-16: qty 20, 10d supply, fill #0

## 2021-05-16 MED ORDER — ACETAMINOPHEN 325 MG PO TABS
650.0000 mg | ORAL_TABLET | Freq: Once | ORAL | Status: AC
  Administered 2021-05-16: 650 mg via ORAL
  Filled 2021-05-16: qty 2

## 2021-05-16 NOTE — ED Notes (Signed)
Discharged at triage by PA 

## 2021-05-16 NOTE — ED Provider Notes (Signed)
Emergency Medicine Provider Triage Evaluation Note  Matthew Scott , a 40 y.o. male  was evaluated in triage.  Pt complains of dental pain last couple days.  He complains of pain localized to the left lower gumline along the second molar.  This pain is been constant worsening.  He rates his pain a 10/10 in severity.  He is having trouble opening his mouth secondary to pain.  He denies any shortness of breath, trouble swallowing, fevers, or chills.  Review of Systems  Positive:  Negative: See above   Physical Exam  BP (!) 145/87 (BP Location: Left Arm)   Pulse 90   Temp 98.8 F (37.1 C)   Resp 18   SpO2 97%  Gen:   Awake, no distress   Resp:  Normal effort  MSK:   Moves extremities without difficulty  Other:  No obvious trismus, second bottom left molar is broken off with moderate amount of swelling.  No evidence of airway compromise at this time.  Patient able to talk in full sentences.  Medical Decision Making  Medically screening exam initiated at 8:40 AM.  Appropriate orders placed.  JENKINS RISDON was informed that the remainder of the evaluation will be completed by another provider, this initial triage assessment does not replace that evaluation, and the importance of remaining in the ED until their evaluation is complete.     Honor Loh [place], PA-C 05/16/21 8875    Linwood Dibbles, MD 05/17/21 [phone]

## 2021-05-16 NOTE — Discharge Instructions (Addendum)
You were seen and evaluated in the emergency department for further evaluation of dental pain.  Discussed, this is likely a infection from your broken tooth.  Please return to the emergency department if you are experiencing worsening pain, trouble swallowing, trouble breathing, trouble talking new fever that will not resolve with Tylenol/ibuprofen, or any other concerns you might have.   Please follow up with Dr. Mia Creek for further evaluation.

## 2021-05-16 NOTE — ED Triage Notes (Signed)
C/o L lower dental pain x 2 days.

## 2021-05-16 NOTE — ED Provider Notes (Signed)
[facility] EMERGENCY DEPARTMENT Provider Note   CSN: [id number] Arrival date & time: 05/16/21  0800     History Chief Complaint  Patient presents with   Dental Pain    Matthew Scott is a 40 y.o. male who presents to the emergency department for left lower dental pain over the last couple of days.  He complains of pain localized to the left lower gumline along the second or third molar.  His pain is been constant and worsening which she characterizes as a throbbing sensation.  He rates his pain a 10/10 in severity.  He states he is having trouble opening his mouth secondary to pain.  He denies any shortness of breath, trouble swallowing, fever, or chills.   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   Qualifier: Diagnosis of  By: Daphine Deutscher FNP, Nykedtra     Chronic abdominal pain    Cyclical vomiting syndrome    GASTRITIS 12/09/2009   Qualifier: Diagnosis of  By: Daphine Deutscher FNP, Nykedtra     Gastroparesis    HELICOBACTER PYLORI INFECTION 12/14/2009   Qualifier: Diagnosis of  By: Levon Hedger     Nausea and vomiting    chronic, recurrent   Polysubstance abuse (HCC)     Patient Active Problem List   Diagnosis Date Noted   Anemia 03/10/2016   Essential hypertension 12/22/2015   GERD (gastroesophageal reflux disease) 01/21/2015   Gastritis 12/19/2014   Alcohol abuse 12/14/2014   Gastroparesis 05/11/2012   Abdominal pain 05/06/2012   Polysubstance abuse (HCC) 05/06/2012   CHOLECYSTECTOMY, HX OF 11/25/2009    Past Surgical History:  Procedure Laterality Date   CHOLECYSTECTOMY  11/2009       Family History  Problem Relation Age of Onset   Other Father    Diabetes Father    Hypertension Father    Pancreatic cancer Father    Colon cancer Neg Hx    Esophageal cancer Neg Hx    Liver cancer Neg Hx     Social History   Tobacco Use   Smoking status: Some Days    Types: Cigars    Last attempt to quit:  12/15/2014    Years since quitting: 6.4   Smokeless tobacco: Never   Tobacco comments:    7 cigars infused with marijuana a day  Vaping Use   Vaping Use: Never used  Substance Use Topics   Alcohol use: No    Alcohol/week: 0.0 standard drinks   Drug use: Yes    Types: Marijuana    Comment: Last used: 4 days ago per patient on 04/30/2018    Home Medications Prior to Admission medications   Medication Sig Start Date End Date Taking? Authorizing Provider  amoxicillin-clavulanate (AUGMENTIN) 875-125 MG tablet Take 1 tablet by mouth every 12 (twelve) hours. 05/16/21  Yes Meredeth Ide, Nishant Schrecengost M, PA-C  dicyclomine (BENTYL) 20 MG tablet Take 1 tablet (20 mg total) by mouth 3 (three) times daily as needed for spasms. 12/15/20   Long, Arlyss Repress, MD  ondansetron (ZOFRAN ODT) 4 MG disintegrating tablet Take 1 tablet (4 mg total) by mouth every 8 (eight) hours as needed for nausea or vomiting. 01/15/21   Liberty Handy, PA-C  pantoprazole (PROTONIX) 20 MG tablet Take 2 tablets (40 mg total) by mouth daily for 14 days. 12/09/20 12/23/20  Alvira Monday, MD  promethazine (PHENERGAN) 25 MG tablet Take 1 tablet (25 mg total) by mouth every 6 (  six) hours as needed for nausea or vomiting (IF continuing severe nausea, able to swallow tablet (may use as an alternative to zofran)). 12/09/20   Alvira Monday, MD  sucralfate (CARAFATE) 1 GM/10ML suspension Take 10 mLs (1 g total) by mouth 4 (four) times daily -  with meals and at bedtime. 01/15/21   Liberty Handy, PA-C    Allergies    Patient has no known allergies.  Review of Systems   Review of Systems  All other systems reviewed and are negative.  Physical Exam Updated Vital Signs BP 136/84   Pulse 63   Temp 98.8 F (37.1 C)   Resp 18   SpO2 100%   Physical Exam Vitals reviewed.  Constitutional:      Appearance: Normal appearance.     Comments: Tearful and appears uncomfortable.   HENT:     Head: Normocephalic and atraumatic.     Mouth/Throat:      Comments: There is evidence of a broken tooth on the left lower second/third molar.  No obvious fluctuance over the area.  Area does not appear swollen.  Tongue is normal.  No evidence of airway compromise at this time.  No swelling over the floor of the mouth beneath the tongue.  Eyes:     General:        Right eye: No discharge.        Left eye: No discharge.     Conjunctiva/sclera: Conjunctivae normal.  Pulmonary:     Effort: Pulmonary effort is normal.  Skin:    General: Skin is warm and dry.     Findings: No rash.  Neurological:     General: No focal deficit present.     Mental Status: He is alert.  Psychiatric:        Mood and Affect: Mood normal.        Behavior: Behavior normal.    ED Results / Procedures / Treatments   Labs (all labs ordered are listed, but only abnormal results are displayed) Labs Reviewed - No data to display  EKG None  Radiology No results found.  Procedures Procedures   Medications Ordered in ED Medications  acetaminophen (TYLENOL) tablet 650 mg (650 mg Oral Given 05/16/21 0851)  oxyCODONE-acetaminophen (PERCOCET/ROXICET) 5-325 MG per tablet 1 tablet (1 tablet Oral Given 05/16/21 1408)    ED Course  I have reviewed the triage vital signs and the nursing notes.  Pertinent labs & imaging results that were available during my care of the patient were reviewed by me and considered in my medical decision making (see chart for details).    MDM Rules/Calculators/A&P                          Matthew Scott is a 40 y.o. male who presents to the emergency department for further evaluation of dental pain.  He is not immunocompromised, he has been afebrile and well-appearing with a patent airway during his stay.  A low suspicion for deep space infection or any concern of airway compromise at this time.  Patient history and physical exam seems to be an underlying tooth infection with the source being a broken tooth either the second or third molar  on the bottom left.  There is no evidence of retropharyngeal abscess, peritonsillar abscess, Ludwick's angina, or periapical abscess at this time.  His pain was adequately controlled with the Percocet and Tylenol.  I will prescribe him 10-day course of Augmentin  in the event he has a developing infection.  We will have him close please follow-up with our dentistry team for further evaluation and possible tooth extraction.  Strict return precautions were given.  He is hemodynamically stable and safe for discharge.   Final Clinical Impression(s) / ED Diagnoses Final diagnoses:  Pain, dental    Rx / DC Orders ED Discharge Orders          Ordered    amoxicillin-clavulanate (AUGMENTIN) 875-125 MG tablet  Every 12 hours        05/16/21 [address] [place], [place] 05/16/21 Verdie Shire, MD 05/17/21 [phone]

## 2021-05-17 ENCOUNTER — Other Ambulatory Visit: Payer: Self-pay

## 2021-05-24 ENCOUNTER — Ambulatory Visit (AMBULATORY_SURGERY_CENTER): Payer: Self-pay | Admitting: *Deleted

## 2021-05-24 VITALS — Ht 69.0 in | Wt 169.0 lb

## 2021-05-24 DIAGNOSIS — R110 Nausea: Secondary | ICD-10-CM

## 2021-05-24 DIAGNOSIS — R1011 Right upper quadrant pain: Secondary | ICD-10-CM

## 2021-05-24 NOTE — Progress Notes (Signed)
No egg or soy allergy known to patient  No issues with past sedation with any surgeries or procedures Patient denies ever being told they had issues or difficulty with intubation  No FH of Malignant Hyperthermia No diet pills per patient No home 02 use per patient  No blood thinners per patient  Pt denies issues with constipation  No A fib or A flutter  COVID 19 guidelines implemented in [facility] today with Pt and CMA Pt is fully vaccinated  for Covid      Due to the COVID-19 pandemic we are asking patients to follow certain guidelines.  Pt aware of COVID protocols and [facility] guidelines   

## 2021-06-03 ENCOUNTER — Encounter: Payer: Self-pay | Admitting: Gastroenterology

## 2021-06-17 ENCOUNTER — Encounter: Payer: Self-pay | Admitting: Gastroenterology

## 2021-06-17 ENCOUNTER — Telehealth: Payer: Self-pay | Admitting: Gastroenterology

## 2021-06-17 NOTE — Telephone Encounter (Signed)
This patient was a no-show for EGD today.  He has two prior now-shows/same day [facility] cancellations,  He is not to be rescheduled for procedures, and please prepare a letter to discharge patient from [facility].  - H. Danis

## 2021-06-18 NOTE — Telephone Encounter (Signed)
Discharge letter printed and patient dismissal activation form has been completed. Both documents have been placed in your IN box and require your signature when you return to the office.   Matthew Scott, just an [place]. Thanks

## 2021-06-25 ENCOUNTER — Telehealth: Payer: Self-pay | Admitting: Gastroenterology

## 2021-06-25 NOTE — Telephone Encounter (Signed)
Patient dismissed from [facility] by Amada Jupiter, MD, effective 06/22/21. Dismissal Letter sent out by 1st class mail. KLM

## 2021-07-07 ENCOUNTER — Other Ambulatory Visit: Payer: Self-pay

## 2021-07-07 ENCOUNTER — Encounter (HOSPITAL_COMMUNITY): Payer: Self-pay | Admitting: Student

## 2021-07-07 ENCOUNTER — Emergency Department (HOSPITAL_COMMUNITY)
Admission: EM | Admit: 2021-07-07 | Discharge: 2021-07-07 | Disposition: A | Discharge status: Home / Self Care | Payer: Self-pay | Attending: Emergency Medicine | Admitting: Emergency Medicine

## 2021-07-07 DIAGNOSIS — K0889 Other specified disorders of teeth and supporting structures: Secondary | ICD-10-CM | POA: Insufficient documentation

## 2021-07-07 DIAGNOSIS — F17290 Nicotine dependence, other tobacco product, uncomplicated: Secondary | ICD-10-CM | POA: Insufficient documentation

## 2021-07-07 DIAGNOSIS — I10 Essential (primary) hypertension: Secondary | ICD-10-CM | POA: Insufficient documentation

## 2021-07-07 MED ORDER — NAPROXEN 500 MG PO TABS
500.0000 mg | ORAL_TABLET | Freq: Two times a day (BID) | ORAL | 0 refills | Status: DC | PRN
  Filled 2021-07-07: qty 15, 8d supply, fill #0

## 2021-07-07 MED ORDER — AMOXICILLIN-POT CLAVULANATE 875-125 MG PO TABS
1.0000 | ORAL_TABLET | Freq: Two times a day (BID) | ORAL | 0 refills | Status: DC
  Filled 2021-07-07: qty 14, 7d supply, fill #0

## 2021-07-07 MED ORDER — OXYCODONE-ACETAMINOPHEN 5-325 MG PO TABS
1.0000 | ORAL_TABLET | Freq: Once | ORAL | Status: AC
  Administered 2021-07-07: 1 via ORAL
  Filled 2021-07-07: qty 1

## 2021-07-07 MED ORDER — KETOROLAC TROMETHAMINE 15 MG/ML IJ SOLN
30.0000 mg | Freq: Once | INTRAMUSCULAR | Status: AC
  Administered 2021-07-07: 30 mg via INTRAMUSCULAR
  Filled 2021-07-07: qty 2

## 2021-07-07 MED ORDER — AMOXICILLIN-POT CLAVULANATE 875-125 MG PO TABS
1.0000 | ORAL_TABLET | Freq: Once | ORAL | Status: AC
  Administered 2021-07-07: 1 via ORAL
  Filled 2021-07-07: qty 1

## 2021-07-07 NOTE — Discharge Instructions (Addendum)
Call one of the dentists offices provided to schedule an appointment for re-evaluation and further management within the next 48 hours.  ° °I have prescribed you Augmentin which is an antibiotic to treat the infection and Naproxen which is an anti-inflammatory medicine to treat the pain.  ° °Please take all of your antibiotics until finished. You may develop abdominal discomfort or diarrhea from the antibiotic.  You may help offset this with probiotics which you can buy at the store (ask your pharmacist if unable to find) or get probiotics in the form of eating yogurt. Do not eat or take the probiotics until 2 hours after your antibiotic. If you are unable to tolerate these side effects follow-up with your primary care provider or return to the emergency department.  ° °If you begin to experience any blistering, rashes, swelling, or difficulty breathing seek medical care for evaluation of potentially more serious side effects.  ° °Be sure to eat something when taking the Naproxen as it can cause stomach upset and at worst stomach bleeding. Do not take additional non steroidal anti-inflammatory medicines such as Ibuprofen, Aleve, Advil, Mobic, Diclofenac, or goodie powder while taking Naproxen. You may supplement with Tylenol.  ° °We have prescribed you new medication(s) today. Discuss the medications prescribed today with your pharmacist as they can have adverse effects and interactions with your other medicines including over the counter and prescribed medications. Seek medical evaluation if you start to experience new or abnormal symptoms after taking one of these medicines, seek care immediately if you start to experience difficulty breathing, feeling of your throat closing, facial swelling, or rash as these could be indications of a more serious allergic reaction ° °If you start to experience and new or worsening symptoms return to the emergency department. If you start to experience fever, chills, neck  stiffness/pain, or inability to move your neck or open your mouth come back to the emergency department immediately.  ° °

## 2021-07-07 NOTE — ED Triage Notes (Addendum)
Pt reports left sided dental pain x 2 days. Pt states that his lower wisdom tooth is broken and he needs to have it removed. Pt states that the pain radiates to his left ear and to the left side of his head.

## 2021-07-07 NOTE — ED Provider Notes (Signed)
[facility]-EMERGENCY DEPT Provider Note   CSN: [id number] Arrival date & time: 07/07/21  0408     History Chief Complaint  Patient presents with   Dental Pain    Matthew Scott is a 40 y.o. male with a hx of gastroparesis, hypertension, & anemia who presents to the ED with complaints of dental pain x 2 days. Left lower dental region, constant, no alleviating factors. Trying ibuprofen without relief. Denies fever, vomiting, dyspnea, or dysphagia.   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   Qualifier: Diagnosis of  By: Daphine Deutscher FNP, [place]     Chronic abdominal pain    Cyclical vomiting syndrome    GASTRITIS 12/09/2009   Qualifier: Diagnosis of  By: Daphine Deutscher FNP, [place]ked[place]     Gastroparesis    HELICOBACTER PYLORI INFECTION 12/14/2009   Qualifier: Diagnosis of  By: Levon Hedger     Nausea and vomiting    chronic, recurrent   Polysubstance abuse (HCC)     Patient Active Problem List   Diagnosis Date Noted   Anemia 03/10/2016   Essential hypertension 12/22/2015   GERD (gastroesophageal reflux disease) 01/21/2015   Gastritis 12/19/2014   Alcohol abuse 12/14/2014   Gastroparesis 05/11/2012   Abdominal pain 05/06/2012   Polysubstance abuse (HCC) 05/06/2012   CHOLECYSTECTOMY, HX OF 11/25/2009    Past Surgical History:  Procedure Laterality Date   CHOLECYSTECTOMY  11/2009       Family History  Problem Relation Age of Onset   Other Father    Diabetes Father    Hypertension Father    Pancreatic cancer Father    Colon cancer Neg Hx    Esophageal cancer Neg Hx    Liver cancer Neg Hx     Social History   Tobacco Use   Smoking status: Some Days    Types: Cigars    Last attempt to quit: 12/15/2014    Years since quitting: 6.5   Smokeless tobacco: Never   Tobacco comments:    7 cigars infused with marijuana a day  Vaping Use   Vaping Use: Never used  Substance Use Topics   Alcohol use: No     Alcohol/week: 0.0 standard drinks   Drug use: Yes    Types: Marijuana    Comment: Last used: 4 days ago per patient on 04/30/2018    Home Medications Prior to Admission medications   Medication Sig Start Date End Date Taking? Authorizing Provider  dicyclomine (BENTYL) 20 MG tablet Take 1 tablet (20 mg total) by mouth 3 (three) times daily as needed for spasms. 12/15/20   Long, Arlyss Repress, MD  ondansetron (ZOFRAN ODT) 4 MG disintegrating tablet Take 1 tablet (4 mg total) by mouth every 8 (eight) hours as needed for nausea or vomiting. 01/15/21   Liberty Handy, PA-C  pantoprazole (PROTONIX) 20 MG tablet Take 2 tablets (40 mg total) by mouth daily for 14 days. 12/09/20 05/24/21  Alvira Monday, MD  promethazine (PHENERGAN) 25 MG tablet Take 1 tablet (25 mg total) by mouth every 6 (six) hours as needed for nausea or vomiting (IF continuing severe nausea, able to swallow tablet (may use as an alternative to zofran)). 12/09/20   Alvira Monday, MD  sucralfate (CARAFATE) 1 GM/10ML suspension Take 10 mLs (1 g total) by mouth 4 (four) times daily -  with meals and at bedtime. 01/15/21   Liberty Handy, PA-C    Allergies  Patient has no known allergies.  Review of Systems   Review of Systems  Constitutional:  Negative for chills and fever.  HENT:  Positive for dental problem. Negative for trouble swallowing.   Respiratory:  Negative for cough and shortness of breath.   Cardiovascular:  Negative for chest pain.  Gastrointestinal:  Negative for vomiting.  Musculoskeletal:  Negative for neck stiffness.  All other systems reviewed and are negative.  Physical Exam Updated Vital Signs BP 136/89 (BP Location: Left Arm)   Pulse 80   Temp 97.9 F (36.6 C) (Oral)   Resp 18   Ht 5\' 7"  (1.702 m)   Wt 81.6 kg   SpO2 96%   BMI 28.19 kg/m   Physical Exam Vitals and nursing note reviewed.  Constitutional:      General: He is not in acute distress.    Appearance: He is well-developed. He is  not toxic-appearing.  HENT:     Head: Normocephalic and atraumatic.     Right Ear: Tympanic membrane is not perforated, erythematous, retracted or bulging.     Left Ear: Tympanic membrane is not perforated, erythematous, retracted or bulging.     Nose: Nose normal.     Mouth/Throat:     Pharynx: Uvula midline. No oropharyngeal exudate or posterior oropharyngeal erythema.     Comments: Left 1st-3rd molars with significant tooth loss and decay. Surrounding gingiva is erythematous and swollen with tenderness to palpation. No obvious fluctuance or gross abscess. Posterior oropharynx is symmetric appearing. Patient tolerating own secretions without difficulty. No trismus. No drooling. No hot potato voice. No swelling beneath the tongue, submandibular compartment is soft.  Eyes:     General:        Right eye: No discharge.        Left eye: No discharge.     Conjunctiva/sclera: Conjunctivae normal.  Cardiovascular:     Rate and Rhythm: Normal rate.  Pulmonary:     Effort: Pulmonary effort is normal.  Musculoskeletal:     Cervical back: Normal range of motion and neck supple.  Lymphadenopathy:     Cervical: No cervical adenopathy.  Neurological:     Mental Status: He is alert.  Psychiatric:        Behavior: Behavior normal.        Thought Content: Thought content normal.    ED Results / Procedures / Treatments   Labs (all labs ordered are listed, but only abnormal results are displayed) Labs Reviewed - No data to display  EKG None  Radiology No results found.  Procedures Procedures   Medications Ordered in ED Medications  oxyCODONE-acetaminophen (PERCOCET/ROXICET) 5-325 MG per tablet 1 tablet (has no administration in time range)  amoxicillin-clavulanate (AUGMENTIN) 875-125 MG per tablet 1 tablet (has no administration in time range)  ketorolac (TORADOL) 15 MG/ML injection 30 mg (has no administration in time range)    ED Course  I have reviewed the triage vital signs and  the nursing notes.  Pertinent labs & imaging results that were available during my care of the patient were reviewed by me and considered in my medical decision making (see chart for details).    MDM Rules/Calculators/A&P                           Patient presents with dental pain. Patient is nontoxic appearing, vitals without significant abnormality. No gross abscess.  Exam unconcerning for Ludwig's angina or deep space infection at this time.  Will treat with Augmentin and Naproxen, last creatinine wnl on chart review.  Urged patient to follow-up with dentist, dental resources were provided.  Discussed treatment plan and need for follow up as well as return precautions. Provided opportunity for questions, patient confirmed understanding and is agreeable to plan.  Final Clinical Impression(s) / ED Diagnoses Final diagnoses:  Pain, dental    Rx / DC Orders ED Discharge Orders          Ordered    naproxen (NAPROSYN) 500 MG tablet  2 times daily PRN        07/07/21 0517    amoxicillin-clavulanate (AUGMENTIN) 875-125 MG tablet  Every 12 hours        07/07/21 0517             Cherly Anderson, PA-C 07/07/21 1027    Geoffery Lyons, MD 07/07/21 [phone]

## 2021-07-13 ENCOUNTER — Other Ambulatory Visit: Payer: Self-pay

## 2021-07-19 ENCOUNTER — Encounter (HOSPITAL_COMMUNITY): Payer: Self-pay

## 2021-07-19 ENCOUNTER — Emergency Department (HOSPITAL_COMMUNITY)
Admission: EM | Admit: 2021-07-19 | Discharge: 2021-07-19 | Disposition: A | Discharge status: Home / Self Care | Payer: Self-pay | Attending: Physician Assistant | Admitting: Physician Assistant

## 2021-07-19 DIAGNOSIS — Z5321 Procedure and treatment not carried out due to patient leaving prior to being seen by health care provider: Secondary | ICD-10-CM | POA: Insufficient documentation

## 2021-07-19 DIAGNOSIS — J101 Influenza due to other identified influenza virus with other respiratory manifestations: Secondary | ICD-10-CM | POA: Insufficient documentation

## 2021-07-19 DIAGNOSIS — Z20822 Contact with and (suspected) exposure to covid-19: Secondary | ICD-10-CM | POA: Insufficient documentation

## 2021-07-19 LAB — COMPREHENSIVE METABOLIC PANEL
ALT: 21 U/L (ref 0–44)
AST: 18 U/L (ref 15–41)
Albumin: 4.1 g/dL (ref 3.5–5.0)
Alkaline Phosphatase: 77 U/L (ref 38–126)
Anion gap: 9 (ref 5–15)
BUN: 8 mg/dL (ref 6–20)
CO2: 27 mmol/L (ref 22–32)
Calcium: 9.5 mg/dL (ref 8.9–10.3)
Chloride: 103 mmol/L (ref 98–111)
Creatinine, Ser: 0.83 mg/dL (ref 0.61–1.24)
GFR, Estimated: >60 mL/min (ref >60)
Glucose, Bld: 104 mg/dL — ABNORMAL HIGH (ref 70–99)
Potassium: 3.8 mmol/L (ref 3.5–5.1)
Sodium: 139 mmol/L (ref 135–145)
Total Bilirubin: 1.2 mg/dL (ref 0.3–1.2)
Total Protein: 7.6 g/dL (ref 6.5–8.1)

## 2021-07-19 LAB — CBC WITH DIFFERENTIAL/PLATELET
Abs Immature Granulocytes: 0.01 10*3/uL (ref 0.00–0.07)
Basophils Absolute: 0 10*3/uL (ref 0.0–0.1)
Basophils Relative: 1 %
Eosinophils Absolute: 0 10*3/uL (ref 0.0–0.5)
Eosinophils Relative: 0 %
HCT: 37.9 % — ABNORMAL LOW (ref 39.0–52.0)
Hemoglobin: 12.5 g/dL — ABNORMAL LOW (ref 13.0–17.0)
Immature Granulocytes: 0 %
Lymphocytes Relative: 21 %
Lymphs Abs: 1.3 10*3/uL (ref 0.7–4.0)
MCH: 33 pg (ref 26.0–34.0)
MCHC: 33 g/dL (ref 30.0–36.0)
MCV: 100 fL (ref 80.0–100.0)
Monocytes Absolute: 0.9 10*3/uL (ref 0.1–1.0)
Monocytes Relative: 14 %
Neutro Abs: 4.1 10*3/uL (ref 1.7–7.7)
Neutrophils Relative %: 64 %
Platelets: 263 10*3/uL (ref 150–400)
RBC: 3.79 MIL/uL — ABNORMAL LOW (ref 4.22–5.81)
RDW: 12.1 % (ref 11.5–15.5)
WBC: 6.3 10*3/uL (ref 4.0–10.5)
nRBC: 0 % (ref 0.0–0.2)

## 2021-07-19 LAB — RESP PANEL BY RT-PCR (FLU A&B, COVID) ARPGX2
Influenza A by PCR: POSITIVE — AB
Influenza B by PCR: NEGATIVE
SARS Coronavirus 2 by RT PCR: NEGATIVE

## 2021-07-19 LAB — LIPASE, BLOOD: Lipase: 23 U/L (ref 11–51)

## 2021-07-19 NOTE — ED Triage Notes (Signed)
Pt presents with c/o right lower quad abdominal pain that started last night and has become worse this morning. Pt denies any N/V/D.

## 2021-07-19 NOTE — ED Provider Notes (Signed)
Emergency Medicine Provider Triage Evaluation Note  Matthew Scott , a 40 y.o. male  was evaluated in triage.  Pt complains of right upper abdominal pain.  His stomach started hurting the morning.    He reports that last night he got a sore throat.  He vomited this morning.  Only vomited once.  He did have watery bms.   Review of Systems  Positive: RUQ abdominal pain., n/v/d, sore throat, headache.  Negative: syncope  Physical Exam  BP 131/75 (BP Location: Right Arm)   Pulse 81   Temp 99 F (37.2 C) (Oral)   Resp 16   SpO2 96%  Gen:   Awake, no distress   Resp:  Normal effort  MSK:   Moves extremities without difficulty  Other:  Normal speech   Medical Decision Making  Medically screening exam initiated at 12:44 PM.  Appropriate orders placed.  Matthew Scott was informed that the remainder of the evaluation will be completed by another provider, this initial triage assessment does not replace that evaluation, and the importance of remaining in the ED until their evaluation is complete.  Note: Portions of this report may have been transcribed using voice recognition software. Every effort was made to ensure accuracy; however, inadvertent computerized transcription errors may be present    Norman Clay 07/19/21 1251    Linwood Dibbles, MD 07/21/21 1011

## 2021-07-19 NOTE — ED Notes (Signed)
Patient left emergency room and refused vital signs

## 2021-11-11 ENCOUNTER — Encounter (HOSPITAL_COMMUNITY): Payer: Self-pay

## 2021-11-11 ENCOUNTER — Emergency Department (HOSPITAL_COMMUNITY)
Admission: EM | Admit: 2021-11-11 | Discharge: 2021-11-11 | Disposition: A | Discharge status: Home / Self Care | Payer: Self-pay | Attending: Emergency Medicine | Admitting: Emergency Medicine

## 2021-11-11 ENCOUNTER — Emergency Department (HOSPITAL_COMMUNITY): Payer: Self-pay

## 2021-11-11 ENCOUNTER — Other Ambulatory Visit: Payer: Self-pay

## 2021-11-11 DIAGNOSIS — R1011 Right upper quadrant pain: Secondary | ICD-10-CM | POA: Insufficient documentation

## 2021-11-11 DIAGNOSIS — R1013 Epigastric pain: Secondary | ICD-10-CM | POA: Insufficient documentation

## 2021-11-11 DIAGNOSIS — R197 Diarrhea, unspecified: Secondary | ICD-10-CM | POA: Insufficient documentation

## 2021-11-11 DIAGNOSIS — R112 Nausea with vomiting, unspecified: Secondary | ICD-10-CM | POA: Insufficient documentation

## 2021-11-11 DIAGNOSIS — I10 Essential (primary) hypertension: Secondary | ICD-10-CM | POA: Insufficient documentation

## 2021-11-11 LAB — COMPREHENSIVE METABOLIC PANEL
ALT: 19 U/L (ref 0–44)
AST: 25 U/L (ref 15–41)
Albumin: 4 g/dL (ref 3.5–5.0)
Alkaline Phosphatase: 64 U/L (ref 38–126)
Anion gap: 7 (ref 5–15)
BUN: 9 mg/dL (ref 6–20)
CO2: 23 mmol/L (ref 22–32)
Calcium: 9.4 mg/dL (ref 8.9–10.3)
Chloride: 109 mmol/L (ref 98–111)
Creatinine, Ser: 0.96 mg/dL (ref 0.61–1.24)
GFR, Estimated: >60 mL/min (ref >60)
Glucose, Bld: 117 mg/dL — ABNORMAL HIGH (ref 70–99)
Potassium: 3.7 mmol/L (ref 3.5–5.1)
Sodium: 139 mmol/L (ref 135–145)
Total Bilirubin: 1.1 mg/dL (ref 0.3–1.2)
Total Protein: 6.9 g/dL (ref 6.5–8.1)

## 2021-11-11 LAB — RAPID URINE DRUG SCREEN, HOSP PERFORMED
Amphetamines: NOT DETECTED
Barbiturates: NOT DETECTED
Benzodiazepines: NOT DETECTED
Cocaine: NOT DETECTED
Opiates: NOT DETECTED
Tetrahydrocannabinol: POSITIVE — AB

## 2021-11-11 LAB — URINALYSIS, ROUTINE W REFLEX MICROSCOPIC
Bilirubin Urine: NEGATIVE
Glucose, UA: NEGATIVE mg/dL
Hgb urine dipstick: NEGATIVE
Ketones, ur: 5 mg/dL — AB
Leukocytes,Ua: NEGATIVE
Nitrite: NEGATIVE
Protein, ur: 30 mg/dL — AB
Specific Gravity, Urine: 1.028 (ref 1.005–1.030)
pH: 5 (ref 5.0–8.0)

## 2021-11-11 LAB — CBC
HCT: 36.4 % — ABNORMAL LOW (ref 39.0–52.0)
Hemoglobin: 12.2 g/dL — ABNORMAL LOW (ref 13.0–17.0)
MCH: 32.8 pg (ref 26.0–34.0)
MCHC: 33.5 g/dL (ref 30.0–36.0)
MCV: 97.8 fL (ref 80.0–100.0)
Platelets: 245 10*3/uL (ref 150–400)
RBC: 3.72 MIL/uL — ABNORMAL LOW (ref 4.22–5.81)
RDW: 12.4 % (ref 11.5–15.5)
WBC: 9.4 10*3/uL (ref 4.0–10.5)
nRBC: 0 % (ref 0.0–0.2)

## 2021-11-11 LAB — LIPASE, BLOOD: Lipase: 21 U/L (ref 11–51)

## 2021-11-11 MED ORDER — IOHEXOL 300 MG/ML  SOLN
100.0000 mL | Freq: Once | INTRAMUSCULAR | Status: AC | PRN
  Administered 2021-11-11: 100 mL via INTRAVENOUS

## 2021-11-11 NOTE — ED Triage Notes (Addendum)
Pt bib [facility] from home with complaints of RUQ pain with n/v/d that started two days ago after smoking a "weed pen".  ?EMS vitals: 148/72, 109CBG, 88HR, 99% ? fentanyl given en route  ?

## 2021-11-11 NOTE — ED Provider Notes (Signed)
?[facility] EMERGENCY DEPARTMENT ?Provider Note ? ? ?CSN: [id number] ?Arrival date & time: 11/11/21  1615 ? ?  ? ?History ? ?Chief Complaint  ?Patient presents with  ? Abdominal Pain  ? ? ?Matthew Scott is a 41 y.o. male brought in by EMS with concern for right upper quadrant pain with nausea, vomiting, and diarrhea for the last 2 days.  Patient states symptoms started after he smoked a "new weed pen".  He endorses diaphoresis, subjective fevers, chills, and lower abdominal pain with bowel movements.  Denies any melena, hematochezia, bloody or bilious emesis. ? ?I personally reviewed this patient's medical records.  Has history of cholecystectomy, polysubstance abuse, GERD, hypertension, gastroparesis, H. pylori infection, biliary dyskinesia. ?Pain completely resolved at this time after fentanyl in route with EMS ? ?HPI ? ?  ? ?Home Medications ?Prior to Admission medications   ?Medication Sig Start Date End Date Taking? Authorizing Provider  ?amoxicillin-clavulanate (AUGMENTIN) 875-125 MG tablet Take 1 tablet by mouth every 12 (twelve) hours. 07/07/21   Petrucelli, Samantha R, PA-C  ?dicyclomine (BENTYL) 20 MG tablet Take 1 tablet (20 mg total) by mouth 3 (three) times daily as needed for spasms. 12/15/20   Long, Arlyss Repress, MD  ?naproxen (NAPROSYN) 500 MG tablet Take 1 tablet (500 mg total) by mouth 2 (two) times daily as needed for moderate pain. 07/07/21   Petrucelli, Samantha R, PA-C  ?ondansetron (ZOFRAN ODT) 4 MG disintegrating tablet Take 1 tablet (4 mg total) by mouth every 8 (eight) hours as needed for nausea or vomiting. 01/15/21   Liberty Handy, PA-C  ?pantoprazole (PROTONIX) 20 MG tablet Take 2 tablets (40 mg total) by mouth daily for 14 days. 12/09/20 05/24/21  Alvira Monday, MD  ?promethazine (PHENERGAN) 25 MG tablet Take 1 tablet (25 mg total) by mouth every 6 (six) hours as needed for nausea or vomiting (IF continuing severe nausea, able to swallow tablet (may use as an  alternative to zofran)). 12/09/20   Alvira Monday, MD  ?sucralfate (CARAFATE) 1 GM/10ML suspension Take 10 mLs (1 g total) by mouth 4 (four) times daily -  with meals and at bedtime. 01/15/21   Liberty Handy, PA-C  ?   ? ?Allergies    ?Patient has no known allergies.   ? ?Review of Systems   ?Review of Systems  ?Constitutional:  Positive for activity change, appetite change, chills, diaphoresis, fatigue and fever.  ?HENT: Negative.    ?Eyes: Negative.   ?Respiratory: Negative.    ?Cardiovascular: Negative.   ?Gastrointestinal:  Positive for abdominal pain, diarrhea, nausea and vomiting.  ?Genitourinary: Negative.   ?Musculoskeletal: Negative.   ?Skin: Negative.   ?Neurological: Negative.   ?Hematological: Negative.   ? ?Physical Exam ?Updated Vital Signs ?BP 137/78 (BP Location: Right Arm)   Pulse 79   Temp 97.9 ?F (36.6 ?C) (Oral)   Resp 16   Ht 5\' 7"  (1.702 m)   Wt 78.9 kg   SpO2 97%   BMI 27.25 kg/m?  ?Physical Exam ?Vitals and nursing note reviewed.  ?Constitutional:   ?   General: He is not in acute distress. ?   Appearance: He is obese. He is not ill-appearing or toxic-appearing.  ?HENT:  ?   Head: Normocephalic and atraumatic.  ?   Nose: Nose normal.  ?   Mouth/Throat:  ?   Mouth: Mucous membranes are moist.  ?   Pharynx: Oropharynx is clear. Uvula midline. No oropharyngeal exudate or posterior oropharyngeal erythema.  ?  Tonsils: No tonsillar exudate.  ?Eyes:  ?   General: Lids are normal. Vision grossly intact.     ?   Right eye: No discharge.     ?   Left eye: No discharge.  ?   Extraocular Movements: Extraocular movements intact.  ?   Conjunctiva/sclera: Conjunctivae normal.  ?   Pupils: Pupils are equal, round, and reactive to light.  ?Neck:  ?   Trachea: Trachea and phonation normal.  ?Cardiovascular:  ?   Rate and Rhythm: Normal rate and regular rhythm.  ?   Pulses: Normal pulses.  ?   Heart sounds: Normal heart sounds. No murmur heard. ?Pulmonary:  ?   Effort: Pulmonary effort is normal.  No tachypnea, bradypnea, accessory muscle usage, prolonged expiration or respiratory distress.  ?   Breath sounds: Normal breath sounds. No wheezing or rales.  ?Chest:  ?   Chest wall: No mass, lacerations, deformity, swelling, tenderness, crepitus or edema.  ?Abdominal:  ?   General: Bowel sounds are normal. There is no distension.  ?   Palpations: Abdomen is soft.  ?   Tenderness: There is abdominal tenderness in the right upper quadrant and epigastric area. There is no right CVA tenderness, left CVA tenderness, guarding or rebound.  ?Musculoskeletal:     ?   General: No deformity.  ?   Cervical back: Normal range of motion and neck supple.  ?   Right lower leg: No edema.  ?   Left lower leg: No edema.  ?Lymphadenopathy:  ?   Cervical: No cervical adenopathy.  ?Skin: ?   General: Skin is warm and dry.  ?   Capillary Refill: Capillary refill takes less than 2 seconds.  ?Neurological:  ?   General: No focal deficit present.  ?   Mental Status: He is alert and oriented to person, place, and time. Mental status is at baseline.  ?   GCS: GCS eye subscore is 4. GCS verbal subscore is 5. GCS motor subscore is 6.  ?   Gait: Gait is intact.  ?Psychiatric:     ?   Mood and Affect: Mood normal.  ? ? ?ED Results / Procedures / Treatments   ?Labs ?(all labs ordered are listed, but only abnormal results are displayed) ?Labs Reviewed  ?LIPASE, BLOOD  ?COMPREHENSIVE METABOLIC PANEL  ?CBC  ?URINALYSIS, ROUTINE W REFLEX MICROSCOPIC  ? ? ?EKG ?None ? ?Radiology ?No results found. ? ?Procedures ?Procedures  ? ? ?Medications Ordered in ED ?Medications - No data to display ? ?ED Course/ Medical Decision Making/ A&P ?  ?                        ?Medical Decision Making ?41 year old male with history of 2 days of right upper quadrant abdominal pain, nausea, vomiting, and diarrhea. ? ?Vital signs are normal and intake.  Cardiopulmonary exam is normal, abdominal exam with right upper quadrant tenderness palpation as well as epigastric  tenderness to palpation with mild guarding.  No CVAT.  Neurovascularly intact in all 4 extremities. ? ?The differential diagnosis for RUQ includes but is not limited to:  Cholelithiasis / choledocholithiasis / cholecystitis / cholangitis, hepatitis (eg. viral, alcoholic, toxic),liver abscess, pancreatitis, liver / pancreatic / biliary tract cancer, ischemic hepatopathy (shock liver), hepatic vein obstruction (Budd-Chiari syndrome), liver cell adenoma, peptic ulcer disease (duodenal), functional or nonulcer dyspepsia, right lower lobe pneumonia, pyelonephritis, urinary calculi, herpes zoster, trauma or musculoskeletal pain, herniated disk, abdominal abscess, intestinal ischemia,  physical or sexual abuseappendicitis, UTI/renal colic, IBD.  ? ? ?Amount and/or Complexity of Data Reviewed ?Labs: ordered. ?   Details: CBC without leukocytosis but with mild anemiaHemoglobin of 12.2 near patient's baseline CMP without electrolyte derangement or abnormal renal function.  Lipase is normal.  UA without evidence of infection.  UDS positive for marijuana. ?Radiology: ordered. ?   Details: Right upper quadrant ultrasound negative for acute abnormality.  CT of the abdomen pelvis visualized with this provider; findings consistent with mild nonobstructing right nephrolithiasis without ureterolithiasis or hydronephrosis. ? ?Risk ?Prescription drug management. ? ? ? ?Physical exam and work-up most consistent with acute nephrolithiasis as etiology for patient's symptoms.  When he returned to the bedside to inform patient of his CT results it seems he has eloped from the department.  Clinical concern for more emergent underlying etiology for the patient's symptoms is exceedingly low.  His vital signs remained normal and he was hemodynamically stable throughout his stay in the emergency department.  He may return at any time for reevaluation and should follow-up in the outpatient setting. ? ?This chart was dictated using voice  recognition software, Dragon. Despite the best efforts of this provider to proofread and correct errors, errors may still occur which can change documentation meaning. ? ?Final Clinical Impression(s) / ED Diagnoses ?Final diagnoses

## 2022-01-29 ENCOUNTER — Other Ambulatory Visit: Payer: Self-pay

## 2022-01-29 ENCOUNTER — Emergency Department (HOSPITAL_COMMUNITY): Payer: Self-pay

## 2022-01-29 ENCOUNTER — Encounter (HOSPITAL_COMMUNITY): Payer: Self-pay

## 2022-01-29 ENCOUNTER — Emergency Department (HOSPITAL_COMMUNITY)
Admission: EM | Admit: 2022-01-29 | Discharge: 2022-01-29 | Disposition: A | Discharge status: Home / Self Care | Payer: Self-pay | Attending: Emergency Medicine | Admitting: Emergency Medicine

## 2022-01-29 DIAGNOSIS — R1031 Right lower quadrant pain: Secondary | ICD-10-CM

## 2022-01-29 DIAGNOSIS — R112 Nausea with vomiting, unspecified: Secondary | ICD-10-CM

## 2022-01-29 DIAGNOSIS — N200 Calculus of kidney: Secondary | ICD-10-CM | POA: Insufficient documentation

## 2022-01-29 DIAGNOSIS — Z79899 Other long term (current) drug therapy: Secondary | ICD-10-CM | POA: Insufficient documentation

## 2022-01-29 DIAGNOSIS — K7689 Other specified diseases of liver: Secondary | ICD-10-CM | POA: Insufficient documentation

## 2022-01-29 LAB — CBC WITH DIFFERENTIAL/PLATELET
Abs Immature Granulocytes: 0.04 10*3/uL (ref 0.00–0.07)
Basophils Absolute: 0 10*3/uL (ref 0.0–0.1)
Basophils Relative: 0 %
Eosinophils Absolute: 0 10*3/uL (ref 0.0–0.5)
Eosinophils Relative: 0 %
HCT: 39 % (ref 39.0–52.0)
Hemoglobin: 13.2 g/dL (ref 13.0–17.0)
Immature Granulocytes: 0 %
Lymphocytes Relative: 15 %
Lymphs Abs: 1.5 10*3/uL (ref 0.7–4.0)
MCH: 34 pg (ref 26.0–34.0)
MCHC: 33.8 g/dL (ref 30.0–36.0)
MCV: 100.5 fL — ABNORMAL HIGH (ref 80.0–100.0)
Monocytes Absolute: 0.6 10*3/uL (ref 0.1–1.0)
Monocytes Relative: 6 %
Neutro Abs: 8.1 10*3/uL — ABNORMAL HIGH (ref 1.7–7.7)
Neutrophils Relative %: 79 %
Platelets: 237 10*3/uL (ref 150–400)
RBC: 3.88 MIL/uL — ABNORMAL LOW (ref 4.22–5.81)
RDW: 12.3 % (ref 11.5–15.5)
WBC: 10.4 10*3/uL (ref 4.0–10.5)
nRBC: 0 % (ref 0.0–0.2)

## 2022-01-29 LAB — COMPREHENSIVE METABOLIC PANEL
ALT: 16 U/L (ref 0–44)
AST: 19 U/L (ref 15–41)
Albumin: 3.9 g/dL (ref 3.5–5.0)
Alkaline Phosphatase: 64 U/L (ref 38–126)
Anion gap: 8 (ref 5–15)
BUN: 8 mg/dL (ref 6–20)
CO2: 24 mmol/L (ref 22–32)
Calcium: 9.6 mg/dL (ref 8.9–10.3)
Chloride: 108 mmol/L (ref 98–111)
Creatinine, Ser: 1 mg/dL (ref 0.61–1.24)
GFR, Estimated: >60 mL/min (ref >60)
Glucose, Bld: 104 mg/dL — ABNORMAL HIGH (ref 70–99)
Potassium: 4.2 mmol/L (ref 3.5–5.1)
Sodium: 140 mmol/L (ref 135–145)
Total Bilirubin: 1.4 mg/dL — ABNORMAL HIGH (ref 0.3–1.2)
Total Protein: 7 g/dL (ref 6.5–8.1)

## 2022-01-29 LAB — LIPASE, BLOOD: Lipase: 20 U/L (ref 11–51)

## 2022-01-29 LAB — RAPID URINE DRUG SCREEN, HOSP PERFORMED
Amphetamines: NOT DETECTED
Barbiturates: NOT DETECTED
Benzodiazepines: NOT DETECTED
Cocaine: NOT DETECTED
Opiates: NOT DETECTED
Tetrahydrocannabinol: POSITIVE — AB

## 2022-01-29 LAB — URINALYSIS, ROUTINE W REFLEX MICROSCOPIC
Bilirubin Urine: NEGATIVE
Glucose, UA: NEGATIVE mg/dL
Hgb urine dipstick: NEGATIVE
Ketones, ur: NEGATIVE mg/dL
Leukocytes,Ua: NEGATIVE
Nitrite: NEGATIVE
Protein, ur: NEGATIVE mg/dL
Specific Gravity, Urine: 1.027 (ref 1.005–1.030)
pH: 5 (ref 5.0–8.0)

## 2022-01-29 MED ORDER — ONDANSETRON 4 MG PO TBDP: 4.0000 mg | ORAL_TABLET | Freq: Three times a day (TID) | ORAL | 0 refills | Status: DC | PRN

## 2022-01-29 MED ORDER — IOHEXOL 300 MG/ML  SOLN
100.0000 mL | Freq: Once | INTRAMUSCULAR | Status: AC | PRN
  Administered 2022-01-29: 100 mL via INTRAVENOUS

## 2022-01-29 NOTE — ED Notes (Signed)
Pt given water a coke and crackers for a PO challenge.

## 2022-01-29 NOTE — ED Provider Notes (Signed)
[facility] EMERGENCY DEPARTMENT Provider Note   CSN: [id number] Arrival date & time: 01/29/22  1125     History  No chief complaint on file.   Matthew Scott is a 41 y.o. male history of chronic abdominal pain, gastroparesis, gastritis, polysubstance use, cyclic vomiting syndrome, status post cholecystectomy (2011).  Patient presents to the emergency department with a complaint of right lower quadrant abdominal pain.  Patient reports that pain started yesterday and has gotten progressively worse since then.  Pain is worse with touch and movement.  Patient reports vomiting 5 times in the last 24 hours.  Describes emesis as yellow in color.  Denies any hematemesis or coffee-ground emesis.  Patient denies any modalities to alleviate his pain or nausea.  Patient endorses marijuana use.  Denies any other illicit drug use or alcohol use.  HPI     Home Medications Prior to Admission medications   Medication Sig Start Date End Date Taking? Authorizing Provider  amoxicillin-clavulanate (AUGMENTIN) 875-125 MG tablet Take 1 tablet by mouth every 12 (twelve) hours. Patient not taking: Reported on 11/11/2021 07/07/21   Petrucelli, Pleas Koch, PA-C  dicyclomine (BENTYL) 20 MG tablet Take 1 tablet (20 mg total) by mouth 3 (three) times daily as needed for spasms. Patient not taking: Reported on 11/11/2021 12/15/20   Long, Arlyss Repress, MD  naproxen (NAPROSYN) 500 MG tablet Take 1 tablet (500 mg total) by mouth 2 (two) times daily as needed for moderate pain. Patient not taking: Reported on 11/11/2021 07/07/21   Petrucelli, Samantha R, PA-C  ondansetron (ZOFRAN ODT) 4 MG disintegrating tablet Take 1 tablet (4 mg total) by mouth every 8 (eight) hours as needed for nausea or vomiting. Patient not taking: Reported on 11/11/2021 01/15/21   Liberty Handy, PA-C  pantoprazole (PROTONIX) 20 MG tablet Take 2 tablets (40 mg total) by mouth daily for 14 days. 12/09/20 05/24/21  Alvira Monday, MD   pantoprazole (PROTONIX) 20 MG tablet Take 20 mg by mouth 2 (two) times daily.    [provider]  promethazine (PHENERGAN) 25 MG tablet Take 1 tablet (25 mg total) by mouth every 6 (six) hours as needed for nausea or vomiting (IF continuing severe nausea, able to swallow tablet (may use as an alternative to zofran)). 12/09/20   Alvira Monday, MD  sucralfate (CARAFATE) 1 GM/10ML suspension Take 10 mLs (1 g total) by mouth 4 (four) times daily -  with meals and at bedtime. Patient not taking: Reported on 11/11/2021 01/15/21   Liberty Handy, PA-C      Allergies    Patient has no known allergies.    Review of Systems   Review of Systems  Constitutional:  Positive for chills. Negative for fever.  Respiratory:  Negative for shortness of breath.   Cardiovascular:  Negative for chest pain.  Gastrointestinal:  Positive for nausea and vomiting. Negative for abdominal distention, abdominal pain, anal bleeding, blood in stool, constipation, diarrhea and rectal pain.  Genitourinary:  Negative for difficulty urinating, dysuria, flank pain, frequency, genital sores, hematuria, penile discharge, penile pain, penile swelling, scrotal swelling, testicular pain and urgency.  Musculoskeletal:  Negative for back pain and neck pain.  Skin:  Negative for color change and rash.  Neurological:  Negative for dizziness, syncope, light-headedness and headaches.  Psychiatric/Behavioral:  Negative for confusion.     Physical Exam Updated Vital Signs BP 125/72 (BP Location: Right Arm)   Pulse 60   Temp 98.7 F (37.1 C) (Oral)   Resp  14   Ht 5\' 9"  (1.753 m)   Wt 86.2 kg   SpO2 100%   BMI 28.06 kg/m  Physical Exam Vitals and nursing note reviewed.  Constitutional:      General: He is not in acute distress.    Appearance: He is not ill-appearing, toxic-appearing or diaphoretic.  HENT:     Head: Normocephalic.  Eyes:     General: No scleral icterus.       Right eye: No discharge.        Left  eye: No discharge.  Cardiovascular:     Rate and Rhythm: Normal rate.  Pulmonary:     Effort: Pulmonary effort is normal.  Abdominal:     General: Abdomen is flat. Bowel sounds are normal. There are no signs of injury.     Palpations: Abdomen is soft. There is no mass or pulsatile mass.     Tenderness: There is abdominal tenderness in the right lower quadrant. There is no right CVA tenderness, left CVA tenderness, guarding or rebound.     Hernia: There is no hernia in the umbilical area or ventral area.     Comments: Patient has tenderness from mid to lower right quadrant  Skin:    General: Skin is warm and dry.  Neurological:     General: No focal deficit present.     Mental Status: He is alert.  Psychiatric:        Behavior: Behavior is cooperative.     ED Results / Procedures / Treatments   Labs (all labs ordered are listed, but only abnormal results are displayed) Labs Reviewed  COMPREHENSIVE METABOLIC PANEL - Abnormal; Notable for the following components:      Result Value   Glucose, Bld 104 (*)    Total Bilirubin 1.4 (*)    All other components within normal limits  CBC WITH DIFFERENTIAL/PLATELET - Abnormal; Notable for the following components:   RBC 3.88 (*)    MCV 100.5 (*)    Neutro Abs 8.1 (*)    All other components within normal limits  LIPASE, BLOOD  URINALYSIS, ROUTINE W REFLEX MICROSCOPIC  RAPID URINE DRUG SCREEN, HOSP PERFORMED    EKG EKG Interpretation  Date/Time:  Saturday January 29 2022 11:32:32 EDT Ventricular Rate:  61 PR Interval:  173 QRS Duration: 97 QT Interval:  385 QTC Calculation: 388 R Axis:   73 Text Interpretation: Sinus rhythm ST elev, probable normal early repol pattern Artifact in lead(s) V5 less t wave peaking than seen previously, similar early repolarization abnormality Confirmed by [id number] [phone]) on 01/29/2022 11:38:34 AM  Radiology CT ABDOMEN PELVIS W CONTRAST  Result Date: 01/29/2022 CLINICAL DATA:  Right lower  quadrant abdominal pain beginning yesterday. Vomiting. EXAM: CT ABDOMEN AND PELVIS WITH CONTRAST TECHNIQUE: Multidetector CT imaging of the abdomen and pelvis was performed using the standard protocol following bolus administration of intravenous contrast. RADIATION DOSE REDUCTION: This exam was performed according to the departmental dose-optimization program which includes automated exposure control, adjustment of the mA and/or kV according to patient size and/or use of iterative reconstruction technique. CONTRAST:  01/31/2022 OMNIPAQUE IOHEXOL 300 MG/ML  SOLN COMPARISON:  CT of the abdomen and pelvis with contrast 11/11/2021. Right upper quadrant ultrasound 11/11/2021 FINDINGS: Lower chest: The lung bases are clear without focal nodule, mass, or airspace disease. Heart size is normal. No significant pleural or pericardial effusion is present. Hepatobiliary: Liver cysts are stable. No new lesions are present. Cholecystectomy noted. Pancreas: Unremarkable. No pancreatic ductal dilatation  or surrounding inflammatory changes. Spleen: Normal in size without focal abnormality. Adrenals/Urinary Tract: Nonobstructing right-sided nephrolithiasis is stable. No significant left-sided stones are present. No obstruction is present. Ureters are within normal limits. The urinary bladder is within normal limits. Stomach/Bowel: Stomach and duodenum are within normal limits. Small bowel is unremarkable. The terminal ileum is within normal limits. The appendix is visualized and within normal limits. Disc best seen on the coronal image number 33. Ascending and transverse colon are within normal limits. The descending and sigmoid colon are normal. Vascular/Lymphatic: No significant vascular findings are present. No enlarged abdominal or pelvic lymph nodes. Reproductive: Prostate is unremarkable. Other: No abdominal wall hernia or abnormality. No abdominopelvic ascites. Musculoskeletal: Lucencies in the right greater than left iliac bone  have slowly progressed over time. These are well-defined. No erosive lesions are present. No other focal osseous lesions are present. Vertebral body heights and alignment are normal. IMPRESSION: 1. No acute or focal lesion to explain the patient's symptoms. 2. Stable nonobstructing right-sided nephrolithiasis. 3. Cholecystectomy. 4. Stable hepatic cysts. 5. Slowly progressed lucent lesions in the right greater than left iliac bone. These are likely benign. No follow-up recommended. Electronically Signed   By: Marin Roberts M.D.   On: 01/29/2022 13:52    Procedures Procedures    Medications Ordered in ED Medications  iohexol (OMNIPAQUE) 300 MG/ML solution 100 mL (100 mLs Intravenous Contrast Given 01/29/22 1330)    ED Course/ Medical Decision Making/ A&P                           Medical Decision Making Amount and/or Complexity of Data Reviewed Labs: ordered. Radiology: ordered.  Risk Prescription drug management.   Alert 41 year old male in no acute distress, nontoxic-appearing.  Presents to the emergency department with a chief complaint of right lower quadrant abdominal pain.  Information is obtained from patient.  I reviewed patient's past medical records including previous provider notes, notes from gastroenterology, previous labs and imaging.  Patient has medical history as outlined in HPI which complicates his care.  Per chart review patient has been seen by Dr. Maurine Minister with [facility] gastroenterology.  With reports of right lower quadrant pain differential diagnosis includes but is not limited to acute appendicitis, renal/ureteral calculus, pyelonephritis, urinary tract infection.  Will obtain CMP, CBC, lipase, UA, and CT abdomen and pelvis with contrast to evaluate further.  Patient received Zofran and 50 mcg of fentanyl with EMS.  Patient has improvement in his pain after receiving fentanyl.  We will hold any additional medications at this time.  I personally viewed  interpret patient's lab results.  Pertinent findings include: -Total bili 1.4 -Lipase within normal limits -UA unremarkable -UDS positive for marijuana  I personally viewed and interpreted patient CT imaging.  Agree with radiology interpretation of: -No acute or focal lesion within patient's abdomen/pelvis -Stable nonobstructing right-sided nephrolithiasis -Stable hepatic cyst -Slowly progressive lucent lesions in the right greater than left iliac bone, likely benign   We will attempt to p.o. challenge patient.  Patient is able to tolerate p.o. intake without difficulty.  On serial reexamination abdomen remains soft, nondistended, with improvement in tenderness.  Will discharge patient at this time to follow-up with [facility] gastroenterology.  Will prescribe patient with course of ODT Zofran.  Based on patient's chief complaint, I considered admission might be necessary, however after reassuring ED workup feel patient is reasonable for discharge.  Discussed results, findings, treatment and follow up. Patient advised of  return precautions. Patient verbalized understanding and agreed with plan.  Portions of this note were generated with Scientist, clinical (histocompatibility and immunogenetics). Dictation errors may occur despite best attempts at proofreading.          Final Clinical Impression(s) / ED Diagnoses Final diagnoses:  Right lower quadrant abdominal pain  Nausea and vomiting, unspecified vomiting type    Rx / DC Orders ED Discharge Orders          Ordered    ondansetron (ZOFRAN-ODT) 4 MG disintegrating tablet  Every 8 hours PRN        01/29/22 1529              Haskel Schroeder, PA-C 01/29/22 1552    Arby Barrette, MD 02/11/22 1150

## 2022-01-29 NOTE — ED Notes (Signed)
Help get patient into a gown on the monitor got patient a warm blanket patient is resting with call bell in reach

## 2022-01-29 NOTE — Discharge Instructions (Addendum)
You came to the emergency department today to be evaluated for your abdominal pain, nausea, and vomiting.  Your lab results and CT imaging were reassuring.  Please follow-up with [facility] gastroenterology for further management of your chronic abdominal pain.  Get help right away if: Your pain does not go away as soon as your health care provider told you to expect. You cannot stop vomiting. Your pain is only in areas of the abdomen, such as the right side or the left lower portion of the abdomen. Pain on the right side could be caused by appendicitis. You have bloody or black stools, or stools that look like tar. You have severe pain, cramping, or bloating in your abdomen. You have signs of dehydration, such as: Dark urine, very little urine, or no urine. Cracked lips. Dry mouth. Sunken eyes. Sleepiness. Weakness. You have trouble breathing or chest pain.

## 2022-01-29 NOTE — ED Triage Notes (Signed)
Pt BIB [facility] from home c/o RLQ abdominal pain that started yesterday. Pt has had 4 episodes of vomiting this morning. Pt denies any diarrhea. Pt received 4mg  of zofran and 50 of fentanyl with EMS.

## 2022-01-29 NOTE — ED Notes (Signed)
Patient transported to CT 

## 2022-02-20 ENCOUNTER — Other Ambulatory Visit: Payer: Self-pay

## 2022-02-20 ENCOUNTER — Emergency Department (HOSPITAL_COMMUNITY)
Admission: EM | Admit: 2022-02-20 | Discharge: 2022-02-20 | Disposition: A | Discharge status: Home / Self Care | Payer: Medicaid Other | Attending: Emergency Medicine | Admitting: Emergency Medicine

## 2022-02-20 ENCOUNTER — Encounter (HOSPITAL_COMMUNITY): Payer: Self-pay

## 2022-02-20 DIAGNOSIS — R61 Generalized hyperhidrosis: Secondary | ICD-10-CM | POA: Insufficient documentation

## 2022-02-20 DIAGNOSIS — R1084 Generalized abdominal pain: Secondary | ICD-10-CM

## 2022-02-20 DIAGNOSIS — D72829 Elevated white blood cell count, unspecified: Secondary | ICD-10-CM | POA: Insufficient documentation

## 2022-02-20 DIAGNOSIS — F17290 Nicotine dependence, other tobacco product, uncomplicated: Secondary | ICD-10-CM | POA: Insufficient documentation

## 2022-02-20 DIAGNOSIS — E860 Dehydration: Secondary | ICD-10-CM | POA: Insufficient documentation

## 2022-02-20 DIAGNOSIS — F419 Anxiety disorder, unspecified: Secondary | ICD-10-CM | POA: Insufficient documentation

## 2022-02-20 LAB — LIPASE, BLOOD: Lipase: 20 U/L (ref 11–51)

## 2022-02-20 LAB — COMPREHENSIVE METABOLIC PANEL
ALT: 17 U/L (ref 0–44)
AST: 27 U/L (ref 15–41)
Albumin: 4.4 g/dL (ref 3.5–5.0)
Alkaline Phosphatase: 73 U/L (ref 38–126)
Anion gap: 22 — ABNORMAL HIGH (ref 5–15)
BUN: 10 mg/dL (ref 6–20)
CO2: 18 mmol/L — ABNORMAL LOW (ref 22–32)
Calcium: 10.5 mg/dL — ABNORMAL HIGH (ref 8.9–10.3)
Chloride: 106 mmol/L (ref 98–111)
Creatinine, Ser: 1.27 mg/dL — ABNORMAL HIGH (ref 0.61–1.24)
GFR, Estimated: >60 mL/min (ref >60)
Glucose, Bld: 141 mg/dL — ABNORMAL HIGH (ref 70–99)
Potassium: 3.5 mmol/L (ref 3.5–5.1)
Sodium: 146 mmol/L — ABNORMAL HIGH (ref 135–145)
Total Bilirubin: 2 mg/dL — ABNORMAL HIGH (ref 0.3–1.2)
Total Protein: 7.6 g/dL (ref 6.5–8.1)

## 2022-02-20 LAB — CBC WITH DIFFERENTIAL/PLATELET
Abs Immature Granulocytes: 0.05 10*3/uL (ref 0.00–0.07)
Basophils Absolute: 0 10*3/uL (ref 0.0–0.1)
Basophils Relative: 0 %
Eosinophils Absolute: 0 10*3/uL (ref 0.0–0.5)
Eosinophils Relative: 0 %
HCT: 38.9 % — ABNORMAL LOW (ref 39.0–52.0)
Hemoglobin: 12.7 g/dL — ABNORMAL LOW (ref 13.0–17.0)
Immature Granulocytes: 0 %
Lymphocytes Relative: 11 %
Lymphs Abs: 1.6 10*3/uL (ref 0.7–4.0)
MCH: 32.9 pg (ref 26.0–34.0)
MCHC: 32.6 g/dL (ref 30.0–36.0)
MCV: 100.8 fL — ABNORMAL HIGH (ref 80.0–100.0)
Monocytes Absolute: 0.7 10*3/uL (ref 0.1–1.0)
Monocytes Relative: 5 %
Neutro Abs: 12 10*3/uL — ABNORMAL HIGH (ref 1.7–7.7)
Neutrophils Relative %: 84 %
Platelets: 287 10*3/uL (ref 150–400)
RBC: 3.86 MIL/uL — ABNORMAL LOW (ref 4.22–5.81)
RDW: 12.9 % (ref 11.5–15.5)
WBC: 14.3 10*3/uL — ABNORMAL HIGH (ref 4.0–10.5)
nRBC: 0 % (ref 0.0–0.2)

## 2022-02-20 MED ORDER — HALOPERIDOL LACTATE 5 MG/ML IJ SOLN
5.0000 mg | Freq: Once | INTRAMUSCULAR | Status: AC
  Administered 2022-02-20: 5 mg via INTRAMUSCULAR
  Filled 2022-02-20: qty 1

## 2022-02-20 MED ORDER — SODIUM CHLORIDE 0.9 % IV BOLUS
1000.0000 mL | Freq: Once | INTRAVENOUS | Status: AC
  Administered 2022-02-20: 1000 mL via INTRAVENOUS

## 2022-02-20 NOTE — ED Notes (Signed)
Pt over in bathroom and no urine sample provided, has cup

## 2022-02-20 NOTE — Discharge Instructions (Signed)
As discussed, your evaluation today has been largely reassuring.  But, it is important that you monitor your condition carefully, and do not hesitate to return to the ED if you develop new, or concerning changes in your condition. ? ?Otherwise, please follow-up with your physician for appropriate ongoing care. ? ?

## 2022-02-20 NOTE — ED Notes (Signed)
Pt cussing staff out when trying to ask the pt to be safe when rolling around in the recliner.

## 2022-02-20 NOTE — ED Triage Notes (Signed)
Patient arrived by EMS with complaint of years of right sided abdominal pain with nausea and vomiting. Reports marijuana use and denies alcohol an other drug use

## 2022-02-20 NOTE — ED Notes (Signed)
Pt looks much improved after meds/fluids

## 2022-02-20 NOTE — ED Provider Triage Note (Signed)
Emergency Medicine Provider Triage Evaluation Note  DACODA FINLAY , a 41 y.o. male  was evaluated in triage.  Pt complains of epigastric abdominal pain nausea and vomiting. History of chronic abdominal pain, gastroparesis, gastritis, polysubstance use, cyclic vomiting syndrome, status post cholecystectomy.  Review of Systems  Positive: Abd pain Negative: diarrhea  Physical Exam  BP (!) 192/95 (BP Location: Left Arm)   Pulse (!) 102   Temp 98.9 F (37.2 C) (Axillary)   Resp 20   SpO2 95%  Gen:   Awake, no distress   Resp:  Normal effort  MSK:   Moves extremities without difficulty  Other:  Diffuse abd tendenrness  Medical Decision Making  Medically screening exam initiated at 12:09 PM.  Appropriate orders placed.  ADVIK WEATHERSPOON was informed that the remainder of the evaluation will be completed by another provider, this initial triage assessment does not replace that evaluation, and the importance of remaining in the ED until their evaluation is complete.  Work-up initiated   Arthor Captain, PA-C 02/20/22 1947

## 2022-02-20 NOTE — ED Provider Notes (Signed)
[facility] EMERGENCY DEPARTMENT Provider Note   CSN: [id number] Arrival date & time: 02/20/22  1139     History  Chief Complaint  Patient presents with   Abdominal Pain    DEMONTAY Scott is a 41 y.o. male.  HPI Patient presents with abdominal pain.  Patient is wailing in pain, pointing to his right side, states that it hurts, cannot answer questions more in depth, or provide additional details of his history.  Additional history is obtained by chart review, the patient is noted to have a history of H. pylori infection in 2011, cyclic vomiting syndrome, gastroparesis, and polysubstance abuse on his chart.  Reported the patient smokes 7 cigars infused with marijuana each day, this may be a prior activity. Level 5 caveat secondary to acuity of condition    Home Medications Prior to Admission medications   Medication Sig Start Date End Date Taking? Authorizing Provider  amoxicillin-clavulanate (AUGMENTIN) 875-125 MG tablet Take 1 tablet by mouth every 12 (twelve) hours. Patient not taking: Reported on 11/11/2021 07/07/21   Petrucelli, Pleas Koch, PA-C  dicyclomine (BENTYL) 20 MG tablet Take 1 tablet (20 mg total) by mouth 3 (three) times daily as needed for spasms. Patient not taking: Reported on 11/11/2021 12/15/20   Long, Arlyss Repress, MD  naproxen (NAPROSYN) 500 MG tablet Take 1 tablet (500 mg total) by mouth 2 (two) times daily as needed for moderate pain. Patient not taking: Reported on 11/11/2021 07/07/21   Petrucelli, Lelon Mast R, PA-C  ondansetron (ZOFRAN-ODT) 4 MG disintegrating tablet Take 1 tablet (4 mg total) by mouth every 8 (eight) hours as needed for nausea or vomiting. 01/29/22   Haskel Schroeder, PA-C  pantoprazole (PROTONIX) 20 MG tablet Take 2 tablets (40 mg total) by mouth daily for 14 days. 12/09/20 05/24/21  Alvira Monday, MD  pantoprazole (PROTONIX) 20 MG tablet Take 20 mg by mouth 2 (two) times daily.    [provider]  promethazine  (PHENERGAN) 25 MG tablet Take 1 tablet (25 mg total) by mouth every 6 (six) hours as needed for nausea or vomiting (IF continuing severe nausea, able to swallow tablet (may use as an alternative to zofran)). 12/09/20   Alvira Monday, MD  sucralfate (CARAFATE) 1 GM/10ML suspension Take 10 mLs (1 g total) by mouth 4 (four) times daily -  with meals and at bedtime. Patient not taking: Reported on 11/11/2021 01/15/21   Liberty Handy, PA-C      Allergies    Patient has no known allergies.    Review of Systems   Review of Systems  Unable to perform ROS: Acuity of condition    Physical Exam Updated Vital Signs BP (!) 129/58   Pulse 63   Temp 98.9 F (37.2 C) (Axillary)   Resp (!) 26   Ht 5\' 9"  (1.753 m)   Wt 86.2 kg   SpO2 96%   BMI 28.06 kg/m  Physical Exam Vitals and nursing note reviewed.  Constitutional:      Appearance: He is well-developed. He is diaphoretic.     Comments: Thin adult male awake and alert, writhing, screaming, pointing to his right side stating that hurts  HENT:     Head: Normocephalic and atraumatic.  Eyes:     Conjunctiva/sclera: Conjunctivae normal.  Cardiovascular:     Rate and Rhythm: Normal rate and regular rhythm.  Pulmonary:     Effort: Pulmonary effort is normal. No respiratory distress.     Breath sounds: No stridor.  Abdominal:     General: There is no distension.  Skin:    General: Skin is warm.  Neurological:     Mental Status: He is alert and oriented to person, place, and time.  Psychiatric:        Mood and Affect: Mood is anxious.     ED Results / Procedures / Treatments   Labs (all labs ordered are listed, but only abnormal results are displayed) Labs Reviewed  COMPREHENSIVE METABOLIC PANEL - Abnormal; Notable for the following components:      Result Value   Sodium 146 (*)    CO2 18 (*)    Glucose, Bld 141 (*)    Creatinine, Ser 1.27 (*)    Calcium 10.5 (*)    Total Bilirubin 2.0 (*)    Anion gap 22 (*)    All other  components within normal limits  CBC WITH DIFFERENTIAL/PLATELET - Abnormal; Notable for the following components:   WBC 14.3 (*)    RBC 3.86 (*)    Hemoglobin 12.7 (*)    HCT 38.9 (*)    MCV 100.8 (*)    Neutro Abs 12.0 (*)    All other components within normal limits  LIPASE, BLOOD  URINALYSIS, ROUTINE W REFLEX MICROSCOPIC  RAPID URINE DRUG SCREEN, HOSP PERFORMED    EKG None  Radiology No results found.  Procedures Procedures    Medications Ordered in ED Medications  haloperidol lactate (HALDOL) injection 5 mg (5 mg Intramuscular Given 02/20/22 1806)  sodium chloride 0.9 % bolus 1,000 mL (1,000 mLs Intravenous New Bag/Given (Non-Interop) 02/20/22 1815)    ED Course/ Medical Decision Making/ A&P This patient with a Hx of polysubstance abuse, gastroparesis presents to the ED for concern of abdominal pain, nausea, vomiting, this involves an extensive number of treatment options, and is a complaint that carries with it a high risk of complications and morbidity.    The differential diagnosis includes acute gastroparesis, substance abuse, possibly marijuana related cyclic nausea, vomiting, abdominal pain, hepatobiliary dysfunction, appendicitis   Social Determinants of Health:  Drug use  Additional history obtained:  Additional history and/or information obtained from chart review, notable for radiographic studies going back to past year to include 6 abdominal pelvis CT scans, most recently earlier this year with reassuring results, I reviewed.   After the initial evaluation, orders, including: Labs fluids Haldol were initiated.   Patient placed on Cardiac and Pulse-Oximetry Monitors. The patient was maintained on a cardiac monitor.  The cardiac monitored showed an rhythm of 110 sinus tach abnormal The patient was also maintained on pulse oximetry. The readings were typically 99% room air normal   On repeat evaluation of the patient improved 8:43 PM Patient calm,  speaking clearly, sitting upright.  He is receiving fluids, received Haldol after arrival with substantial improvement in his condition Lab Tests:  I personally interpreted labs.  The pertinent results include: Mild dehydration with creatinine 1.27, gap acidosis with mild leukocytosis   Dispostion / Final MDM:  After consideration of the diagnostic results and the patient's response to treatment, this adult male with a history of gastroparesis, prior charting suggesting polysubstance abuse, including daily marijuana use, now presents with abdominal pain, uncontrollable on arrival, improved markedly while here with fluids, Haldol.  Labs consistent with reactive processes, with no fever, pain on repeat exam no tenderness to palpation of his abdomen, there is low suspicion for acute new abdominal phenomena, some suspicion for recurrence of his gastroparesis, possibly due to substance abuse.  With 6 prior CT scans in the past years that were reassuring of his abdomen pelvis, no indication for repeat imaging given his improvement here today.  Final Clinical Impression(s) / ED Diagnoses Final diagnoses:  Generalized abdominal pain    Rx / DC Orders ED Discharge Orders     None         Gerhard Munch, MD 02/20/22 2043

## 2022-05-17 ENCOUNTER — Other Ambulatory Visit: Payer: Self-pay

## 2022-05-17 ENCOUNTER — Emergency Department (HOSPITAL_COMMUNITY)
Admission: EM | Admit: 2022-05-17 | Discharge: 2022-05-17 | Disposition: A | Discharge status: Home / Self Care | Payer: Medicaid Other | Attending: Emergency Medicine | Admitting: Emergency Medicine

## 2022-05-17 DIAGNOSIS — K029 Dental caries, unspecified: Secondary | ICD-10-CM | POA: Insufficient documentation

## 2022-05-17 DIAGNOSIS — I10 Essential (primary) hypertension: Secondary | ICD-10-CM | POA: Insufficient documentation

## 2022-05-17 DIAGNOSIS — K047 Periapical abscess without sinus: Secondary | ICD-10-CM | POA: Insufficient documentation

## 2022-05-17 MED ORDER — KETOROLAC TROMETHAMINE 15 MG/ML IJ SOLN
15.0000 mg | Freq: Once | INTRAMUSCULAR | Status: AC
  Administered 2022-05-17: 15 mg via INTRAMUSCULAR
  Filled 2022-05-17: qty 1

## 2022-05-17 MED ORDER — PENICILLIN V POTASSIUM 500 MG PO TABS
500.0000 mg | ORAL_TABLET | Freq: Four times a day (QID) | ORAL | 0 refills | Status: AC
  Filled 2022-05-17: qty 28, 7d supply, fill #0

## 2022-05-17 MED ORDER — BENZOCAINE 10 % MT GEL
1.0000 | OROMUCOSAL | 0 refills | Status: DC | PRN
Start: 2022-05-17 — End: 2022-11-11
  Filled 2022-05-17: qty 5.3, fill #0

## 2022-05-17 MED ORDER — PENICILLIN V POTASSIUM 250 MG PO TABS
500.0000 mg | ORAL_TABLET | Freq: Once | ORAL | Status: AC
  Administered 2022-05-17: 500 mg via ORAL
  Filled 2022-05-17: qty 2

## 2022-05-17 MED ORDER — NAPROXEN 500 MG PO TABS
500.0000 mg | ORAL_TABLET | Freq: Two times a day (BID) | ORAL | 0 refills | Status: DC
  Filled 2022-05-17: qty 30, 15d supply, fill #0

## 2022-05-17 MED ORDER — ACETAMINOPHEN 325 MG PO TABS
650.0000 mg | ORAL_TABLET | Freq: Four times a day (QID) | ORAL | Status: DC | PRN
  Administered 2022-05-17: 650 mg via ORAL
  Filled 2022-05-17: qty 2

## 2022-05-17 NOTE — Discharge Instructions (Addendum)
It was a pleasure taking care of you today.  As discussed, it appears you have a mild dental infection.  I am sending you home with antibiotics and pain medication.  Take pain medication as needed.  Finish all antibiotics.  I have included dental resources in the community.  Please call today to schedule an appointment for further evaluation.  Return to the ER for new or worsening symptoms.

## 2022-05-17 NOTE — ED Notes (Signed)
Pt verbalizes understanding of discharge instructions. Opportunity for questions and answers were provided. Pt discharged from the ED.   ?

## 2022-05-17 NOTE — ED Provider Notes (Signed)
[facility] EMERGENCY DEPARTMENT Provider Note   CSN: [id number] Arrival date & time: 05/17/22  9563     History  Chief Complaint  Patient presents with   Dental Problem   Facial Swelling    Matthew Scott is a 41 y.o. male with a past medical history significant for hypertension, GERD, polysubstance abuse, and alcohol abuse who presents to the ED due to left lower dental pain x2 days.  Patient admits to left lower facial edema.  Denies difficulties breathing, trismus, and changes to phonation.  No fever or chills.  Patient is unsure when he last saw a dentist.  History obtained from patient and past medical records. No interpreter used during encounter. \     Home Medications Prior to Admission medications   Medication Sig Start Date End Date Taking? Authorizing Provider  benzocaine (ORAJEL) 10 % mucosal gel Use as directed 1 Application in the mouth or throat as needed for mouth pain. 05/17/22  Yes Ayonna Speranza C, PA-C  naproxen (NAPROSYN) 500 MG tablet Take 1 tablet (500 mg total) by mouth 2 (two) times daily. 05/17/22  Yes Kalisi Bevill C, PA-C  penicillin v potassium (VEETID) 500 MG tablet Take 1 tablet (500 mg total) by mouth 4 (four) times daily for 7 days. 05/17/22 05/24/22 Yes Gwendy Boeder C, PA-C  amoxicillin-clavulanate (AUGMENTIN) 875-125 MG tablet Take 1 tablet by mouth every 12 (twelve) hours. Patient not taking: Reported on 11/11/2021 07/07/21   Petrucelli, Glynda Jaeger, PA-C  dicyclomine (BENTYL) 20 MG tablet Take 1 tablet (20 mg total) by mouth 3 (three) times daily as needed for spasms. Patient not taking: Reported on 11/11/2021 12/15/20   Long, Wonda Olds, MD  naproxen (NAPROSYN) 500 MG tablet Take 1 tablet (500 mg total) by mouth 2 (two) times daily as needed for moderate pain. Patient not taking: Reported on 11/11/2021 07/07/21   Petrucelli, Aldona Bar R, PA-C  ondansetron (ZOFRAN-ODT) 4 MG disintegrating tablet Take 1 tablet (4 mg total) by  mouth every 8 (eight) hours as needed for nausea or vomiting. 01/29/22   Loni Beckwith, PA-C  pantoprazole (PROTONIX) 20 MG tablet Take 2 tablets (40 mg total) by mouth daily for 14 days. 12/09/20 05/24/21  Gareth Morgan, MD  pantoprazole (PROTONIX) 20 MG tablet Take 20 mg by mouth 2 (two) times daily.    [provider]  promethazine (PHENERGAN) 25 MG tablet Take 1 tablet (25 mg total) by mouth every 6 (six) hours as needed for nausea or vomiting (IF continuing severe nausea, able to swallow tablet (may use as an alternative to zofran)). 12/09/20   Gareth Morgan, MD  sucralfate (CARAFATE) 1 GM/10ML suspension Take 10 mLs (1 g total) by mouth 4 (four) times daily -  with meals and at bedtime. Patient not taking: Reported on 11/11/2021 01/15/21   Kinnie Feil, PA-C      Allergies    Patient has no known allergies.    Review of Systems   Review of Systems  Constitutional:  Negative for chills and fever.  HENT:  Positive for dental problem and facial swelling. Negative for sore throat, trouble swallowing and voice change.   All other systems reviewed and are negative.   Physical Exam Updated Vital Signs BP 118/71 (BP Location: Left Arm)   Pulse 73   Temp 98.5 F (36.9 C) (Oral)   Resp 18   Ht 5\' 9"  (1.753 m)   Wt 81.6 kg   SpO2 96%   BMI 26.58 kg/m  Physical Exam Vitals and nursing note reviewed.  Constitutional:      General: He is not in acute distress.    Appearance: He is not ill-appearing.  HENT:     Head: Normocephalic.     Mouth/Throat:     Comments: Left lower posterior molars with numerous caries.  Gingival erythema and edema.  No abscess.  Tongue in normal position without protrusion.  No trismus.  Normal phonation.  Patient tolerating oral secretions without difficulty. Eyes:     Pupils: Pupils are equal, round, and reactive to light.  Neck:     Comments: No meningismus. Cardiovascular:     Rate and Rhythm: Normal rate and regular rhythm.      Pulses: Normal pulses.     Heart sounds: Normal heart sounds. No murmur heard.    No friction rub. No gallop.  Pulmonary:     Effort: Pulmonary effort is normal.     Breath sounds: Normal breath sounds.  Abdominal:     General: Abdomen is flat. There is no distension.     Palpations: Abdomen is soft.     Tenderness: There is no abdominal tenderness. There is no guarding or rebound.  Musculoskeletal:        General: Normal range of motion.     Cervical back: Neck supple.  Skin:    General: Skin is warm and dry.  Neurological:     General: No focal deficit present.     Mental Status: He is alert.  Psychiatric:        Mood and Affect: Mood normal.        Behavior: Behavior normal.     ED Results / Procedures / Treatments   Labs (all labs ordered are listed, but only abnormal results are displayed) Labs Reviewed - No data to display  EKG None  Radiology No results found.  Procedures Procedures    Medications Ordered in ED Medications  acetaminophen (TYLENOL) tablet 650 mg (650 mg Oral Given 05/17/22 1106)  ketorolac (TORADOL) 15 MG/ML injection 15 mg (15 mg Intramuscular Given 05/17/22 1126)  penicillin v potassium (VEETID) tablet 500 mg (500 mg Oral Given 05/17/22 1126)    ED Course/ Medical Decision Making/ A&P                           Medical Decision Making Risk OTC drugs. Prescription drug management.   41 year old male presents to the ED due to left lower dental pain associated with left-sided facial edema.  No fever or chills.  Unsure when he last saw dentist.  Upon arrival, vitals all within normal limits.  Patient is afebrile, not tachycardic or hypoxic. Low suspicion for sepsis. Patient in no acute distress.  Physical exam significant for dental caries to left lower posterior molars with some decay.  Mild left facial edema.  No drainable abscess.  Normal phonation.  No trismus.  Tongue in normal position without protrusion or tenderness. Patient tolerating  oral secretions without difficulty. Low suspicion for Ludwig's or deep space infection.  Patient treated with IM Toradol and Pen VK here in the ED with improvement in pain.  Patient discharged with naproxen, Pen-VK, and benzocaine gel.  Dental resources given to patient at discharge and advised to call to schedule an appointment for further evaluation. Strict ED precautions discussed with patient. Patient states understanding and agrees to plan. Patient discharged home in no acute distress and stable vitals  Final Clinical Impression(s) / ED Diagnoses Final diagnoses:  Dental infection    Rx / DC Orders ED Discharge Orders          Ordered    penicillin v potassium (VEETID) 500 MG tablet  4 times daily        05/17/22 1119    naproxen (NAPROSYN) 500 MG tablet  2 times daily        05/17/22 1119    benzocaine (ORAJEL) 10 % mucosal gel  As needed        05/17/22 1119              Suzy Bouchard, PA-C 05/17/22 1156    Audley Hose, MD 05/18/22 1055

## 2022-05-17 NOTE — ED Provider Triage Note (Signed)
Emergency Medicine Provider Triage Evaluation Note  Matthew Scott , a 41 y.o. male  was evaluated in triage.  Pt complains of left lower posterior chest pain.  Second and third molar area.  Pain started 3 days ago.  Swelling started shortly afterwards.  Swelling is increased.  Pain is constant describes as a dull ache.  Took 2 Aleve yesterday with moderate relief.  States he did take 1 antibiotic yesterday, does not know what antibiotic he was or what it was prescribed for.  Review of Systems  Positive: As above Negative: Fevers, chills, drooling, hoarse voice, trismus  Physical Exam  BP 118/71 (BP Location: Left Arm)   Pulse 73   Temp 98.5 F (36.9 C) (Oral)   Resp 18   Ht 5\' 9"  (1.753 m)   Wt 81.6 kg   SpO2 96%   BMI 26.58 kg/m  Gen:   Awake, no distress   Resp:  Normal effort  MSK:   Moves extremities without difficulty  Other:  Left lower second and third molars with obvious signs of decay.  Left lower jaw with moderate swelling.  No fluctuant mass.  No obvious drainage.  No trismus.  Tenderness to palpation.  Soft palate and submandibular areas still soft.  Medical Decision Making  Medically screening exam initiated at 10:39 AM.  Appropriate orders placed.  Matthew Scott was informed that the remainder of the evaluation will be completed by another provider, this initial triage assessment does not replace that evaluation, and the importance of remaining in the ED until their evaluation is complete.     Roylene Reason, [place] 05/17/22 1041

## 2022-05-17 NOTE — ED Triage Notes (Signed)
Pt. Stated, I have a tooth broken off and my jaw is swollen for 2 days.

## 2022-08-31 ENCOUNTER — Other Ambulatory Visit: Payer: Self-pay

## 2022-08-31 ENCOUNTER — Emergency Department (HOSPITAL_COMMUNITY): Payer: Medicaid Other

## 2022-08-31 ENCOUNTER — Emergency Department (HOSPITAL_COMMUNITY)
Admission: EM | Admit: 2022-08-31 | Discharge: 2022-09-01 | Discharge status: Against Medical Advice | Payer: Medicaid Other | Attending: Emergency Medicine | Admitting: Emergency Medicine

## 2022-08-31 DIAGNOSIS — D72829 Elevated white blood cell count, unspecified: Secondary | ICD-10-CM | POA: Insufficient documentation

## 2022-08-31 DIAGNOSIS — R109 Unspecified abdominal pain: Secondary | ICD-10-CM | POA: Insufficient documentation

## 2022-08-31 DIAGNOSIS — F12188 Cannabis abuse with other cannabis-induced disorder: Secondary | ICD-10-CM | POA: Insufficient documentation

## 2022-08-31 DIAGNOSIS — Z79899 Other long term (current) drug therapy: Secondary | ICD-10-CM | POA: Insufficient documentation

## 2022-08-31 DIAGNOSIS — R112 Nausea with vomiting, unspecified: Secondary | ICD-10-CM | POA: Diagnosis not present

## 2022-08-31 DIAGNOSIS — Z5321 Procedure and treatment not carried out due to patient leaving prior to being seen by health care provider: Secondary | ICD-10-CM | POA: Diagnosis not present

## 2022-08-31 DIAGNOSIS — F17290 Nicotine dependence, other tobacco product, uncomplicated: Secondary | ICD-10-CM | POA: Diagnosis not present

## 2022-08-31 DIAGNOSIS — I10 Essential (primary) hypertension: Secondary | ICD-10-CM | POA: Insufficient documentation

## 2022-08-31 LAB — CBC WITH DIFFERENTIAL/PLATELET
Abs Immature Granulocytes: 0.04 10*3/uL (ref 0.00–0.07)
Basophils Absolute: 0 10*3/uL (ref 0.0–0.1)
Basophils Relative: 0 %
Eosinophils Absolute: 0 10*3/uL (ref 0.0–0.5)
Eosinophils Relative: 0 %
HCT: 39.6 % (ref 39.0–52.0)
Hemoglobin: 12.8 g/dL — ABNORMAL LOW (ref 13.0–17.0)
Immature Granulocytes: 0 %
Lymphocytes Relative: 8 %
Lymphs Abs: 0.9 10*3/uL (ref 0.7–4.0)
MCH: 32.3 pg (ref 26.0–34.0)
MCHC: 32.3 g/dL (ref 30.0–36.0)
MCV: 100 fL (ref 80.0–100.0)
Monocytes Absolute: 0.3 10*3/uL (ref 0.1–1.0)
Monocytes Relative: 3 %
Neutro Abs: 9.9 10*3/uL — ABNORMAL HIGH (ref 1.7–7.7)
Neutrophils Relative %: 89 %
Platelets: 268 10*3/uL (ref 150–400)
RBC: 3.96 MIL/uL — ABNORMAL LOW (ref 4.22–5.81)
RDW: 12.7 % (ref 11.5–15.5)
WBC: 11.2 10*3/uL — ABNORMAL HIGH (ref 4.0–10.5)
nRBC: 0 % (ref 0.0–0.2)

## 2022-08-31 LAB — COMPREHENSIVE METABOLIC PANEL
ALT: 19 U/L (ref 0–44)
AST: 27 U/L (ref 15–41)
Albumin: 4.3 g/dL (ref 3.5–5.0)
Alkaline Phosphatase: 63 U/L (ref 38–126)
Anion gap: 9 (ref 5–15)
BUN: 10 mg/dL (ref 6–20)
CO2: 24 mmol/L (ref 22–32)
Calcium: 9.2 mg/dL (ref 8.9–10.3)
Chloride: 108 mmol/L (ref 98–111)
Creatinine, Ser: 0.9 mg/dL (ref 0.61–1.24)
GFR, Estimated: >60 mL/min (ref >60)
Glucose, Bld: 97 mg/dL (ref 70–99)
Potassium: 3.9 mmol/L (ref 3.5–5.1)
Sodium: 141 mmol/L (ref 135–145)
Total Bilirubin: 1.3 mg/dL — ABNORMAL HIGH (ref 0.3–1.2)
Total Protein: 7.5 g/dL (ref 6.5–8.1)

## 2022-08-31 LAB — LIPASE, BLOOD: Lipase: 29 U/L (ref 11–51)

## 2022-08-31 MED ORDER — IOHEXOL 300 MG/ML  SOLN
100.0000 mL | Freq: Once | INTRAMUSCULAR | Status: AC | PRN
  Administered 2022-08-31: 100 mL via INTRAVENOUS

## 2022-08-31 NOTE — ED Triage Notes (Signed)
Pt BIB EMS c/o diffuse abdominal pain, n/v/d. Pt states he has been having this pain everyday x 15 years. Worse today,

## 2022-08-31 NOTE — ED Provider Triage Note (Signed)
Emergency Medicine Provider Triage Evaluation Note  SHELBY ANDERLE , a 42 y.o. male  was evaluated in triage.  Pt complains of abdominal pain.  Patient reports that he has had abdominal pain, nausea vomiting for the last 15 years or so since he had his gallbladder removed.  Patient reports that he experienced an episode of nausea and vomiting earlier today.  Patient denies any headaches, fever, chest pain, shortness of breath.  Review of Systems  Positive: As above Negative: As above  Physical Exam  BP 137/81 (BP Location: Left Arm)   Pulse 81   Temp 98.3 F (36.8 C) (Oral)   Resp 18   Ht 5\' 9"  (1.753 m)   Wt 81 kg   SpO2 97%   BMI 26.37 kg/m  Gen:   Awake, no distress  Resp:  Normal effort  MSK:   Moves extremities without difficulty  Other:  Diffuse abdominal tenderness, patient does point to right side of abdomen for most significant pain  Medical Decision Making  Medically screening exam initiated at 4:58 PM.  Appropriate orders placed.  SAVON BORDONARO was informed that the remainder of the evaluation will be completed by another provider, this initial triage assessment does not replace that evaluation, and the importance of remaining in the ED until their evaluation is complete.     Luvenia Heller, PA-C 08/31/22 1704

## 2022-08-31 NOTE — ED Notes (Signed)
Needs labs

## 2022-09-01 ENCOUNTER — Other Ambulatory Visit: Payer: Self-pay

## 2022-09-01 ENCOUNTER — Emergency Department (HOSPITAL_COMMUNITY)
Admission: EM | Admit: 2022-09-01 | Discharge: 2022-09-02 | Disposition: A | Discharge status: Home / Self Care | Payer: Medicaid Other | Source: Home / Self Care | Attending: Emergency Medicine | Admitting: Emergency Medicine

## 2022-09-01 ENCOUNTER — Emergency Department (HOSPITAL_COMMUNITY)
Admission: EM | Admit: 2022-09-01 | Discharge: 2022-09-01 | Disposition: A | Discharge status: Home / Self Care | Payer: Medicaid Other | Source: Home / Self Care | Attending: Emergency Medicine | Admitting: Emergency Medicine

## 2022-09-01 DIAGNOSIS — R112 Nausea with vomiting, unspecified: Secondary | ICD-10-CM | POA: Insufficient documentation

## 2022-09-01 DIAGNOSIS — R109 Unspecified abdominal pain: Secondary | ICD-10-CM

## 2022-09-01 DIAGNOSIS — D72829 Elevated white blood cell count, unspecified: Secondary | ICD-10-CM | POA: Insufficient documentation

## 2022-09-01 DIAGNOSIS — Z79899 Other long term (current) drug therapy: Secondary | ICD-10-CM | POA: Insufficient documentation

## 2022-09-01 DIAGNOSIS — F12188 Cannabis abuse with other cannabis-induced disorder: Secondary | ICD-10-CM | POA: Insufficient documentation

## 2022-09-01 DIAGNOSIS — F1290 Cannabis use, unspecified, uncomplicated: Secondary | ICD-10-CM

## 2022-09-01 DIAGNOSIS — I10 Essential (primary) hypertension: Secondary | ICD-10-CM | POA: Insufficient documentation

## 2022-09-01 DIAGNOSIS — F17290 Nicotine dependence, other tobacco product, uncomplicated: Secondary | ICD-10-CM | POA: Insufficient documentation

## 2022-09-01 LAB — COMPREHENSIVE METABOLIC PANEL
ALT: 23 U/L (ref 0–44)
ALT: 31 U/L (ref 0–44)
AST: 31 U/L (ref 15–41)
AST: 52 U/L — ABNORMAL HIGH (ref 15–41)
Albumin: 4.2 g/dL (ref 3.5–5.0)
Albumin: 4.6 g/dL (ref 3.5–5.0)
Alkaline Phosphatase: 69 U/L (ref 38–126)
Alkaline Phosphatase: 71 U/L (ref 38–126)
Anion gap: 10 (ref 5–15)
Anion gap: 13 (ref 5–15)
BUN: 11 mg/dL (ref 6–20)
BUN: 20 mg/dL (ref 6–20)
CO2: 24 mmol/L (ref 22–32)
CO2: 21 mmol/L — ABNORMAL LOW (ref 22–32)
Calcium: 9.7 mg/dL (ref 8.9–10.3)
Calcium: 9.7 mg/dL (ref 8.9–10.3)
Chloride: 104 mmol/L (ref 98–111)
Chloride: 108 mmol/L (ref 98–111)
Creatinine, Ser: 1.01 mg/dL (ref 0.61–1.24)
Creatinine, Ser: 0.98 mg/dL (ref 0.61–1.24)
GFR, Estimated: >60 mL/min (ref >60)
GFR, Estimated: >60 mL/min (ref >60)
Glucose, Bld: 105 mg/dL — ABNORMAL HIGH (ref 70–99)
Glucose, Bld: 103 mg/dL — ABNORMAL HIGH (ref 70–99)
Potassium: 3.9 mmol/L (ref 3.5–5.1)
Potassium: 4.2 mmol/L (ref 3.5–5.1)
Sodium: 138 mmol/L (ref 135–145)
Sodium: 142 mmol/L (ref 135–145)
Total Bilirubin: 1.5 mg/dL — ABNORMAL HIGH (ref 0.3–1.2)
Total Bilirubin: 1.9 mg/dL — ABNORMAL HIGH (ref 0.3–1.2)
Total Protein: 7.4 g/dL (ref 6.5–8.1)
Total Protein: 8.1 g/dL (ref 6.5–8.1)

## 2022-09-01 LAB — CBC WITH DIFFERENTIAL/PLATELET
Abs Immature Granulocytes: 0.03 10*3/uL (ref 0.00–0.07)
Abs Immature Granulocytes: 0.05 10*3/uL (ref 0.00–0.07)
Basophils Absolute: 0 10*3/uL (ref 0.0–0.1)
Basophils Absolute: 0 10*3/uL (ref 0.0–0.1)
Basophils Relative: 0 %
Basophils Relative: 0 %
Eosinophils Absolute: 0 10*3/uL (ref 0.0–0.5)
Eosinophils Absolute: 0 10*3/uL (ref 0.0–0.5)
Eosinophils Relative: 0 %
Eosinophils Relative: 0 %
HCT: 36.9 % — ABNORMAL LOW (ref 39.0–52.0)
HCT: 38.2 % — ABNORMAL LOW (ref 39.0–52.0)
Hemoglobin: 12.4 g/dL — ABNORMAL LOW (ref 13.0–17.0)
Hemoglobin: 12.4 g/dL — ABNORMAL LOW (ref 13.0–17.0)
Immature Granulocytes: 0 %
Immature Granulocytes: 0 %
Lymphocytes Relative: 11 %
Lymphocytes Relative: 13 %
Lymphs Abs: 1.2 10*3/uL (ref 0.7–4.0)
Lymphs Abs: 1.6 10*3/uL (ref 0.7–4.0)
MCH: 32.9 pg (ref 26.0–34.0)
MCH: 32.4 pg (ref 26.0–34.0)
MCHC: 33.6 g/dL (ref 30.0–36.0)
MCHC: 32.5 g/dL (ref 30.0–36.0)
MCV: 97.9 fL (ref 80.0–100.0)
MCV: 99.7 fL (ref 80.0–100.0)
Monocytes Absolute: 0.5 10*3/uL (ref 0.1–1.0)
Monocytes Absolute: 0.6 10*3/uL (ref 0.1–1.0)
Monocytes Relative: 5 %
Monocytes Relative: 5 %
Neutro Abs: 8.8 10*3/uL — ABNORMAL HIGH (ref 1.7–7.7)
Neutro Abs: 10.2 10*3/uL — ABNORMAL HIGH (ref 1.7–7.7)
Neutrophils Relative %: 84 %
Neutrophils Relative %: 82 %
Platelets: 273 10*3/uL (ref 150–400)
Platelets: 270 10*3/uL (ref 150–400)
RBC: 3.77 MIL/uL — ABNORMAL LOW (ref 4.22–5.81)
RBC: 3.83 MIL/uL — ABNORMAL LOW (ref 4.22–5.81)
RDW: 12.7 % (ref 11.5–15.5)
RDW: 12.9 % (ref 11.5–15.5)
WBC: 10.5 10*3/uL (ref 4.0–10.5)
WBC: 12.5 10*3/uL — ABNORMAL HIGH (ref 4.0–10.5)
nRBC: 0 % (ref 0.0–0.2)
nRBC: 0 % (ref 0.0–0.2)

## 2022-09-01 LAB — LIPASE, BLOOD
Lipase: 25 U/L (ref 11–51)
Lipase: 28 U/L (ref 11–51)

## 2022-09-01 MED ORDER — CAPSAICIN 0.025 % EX CREA
TOPICAL_CREAM | Freq: Two times a day (BID) | CUTANEOUS | Status: DC
  Filled 2022-09-01: qty 60

## 2022-09-01 MED ORDER — FAMOTIDINE IN NACL 20-0.9 MG/50ML-% IV SOLN
20.0000 mg | Freq: Once | INTRAVENOUS | Status: AC
  Administered 2022-09-01: 20 mg via INTRAVENOUS
  Filled 2022-09-01: qty 50

## 2022-09-01 MED ORDER — ACETAMINOPHEN 500 MG PO TABS
1000.0000 mg | ORAL_TABLET | Freq: Once | ORAL | Status: AC
  Administered 2022-09-01: 1000 mg via ORAL
  Filled 2022-09-01: qty 2

## 2022-09-01 MED ORDER — DIPHENHYDRAMINE HCL 50 MG/ML IJ SOLN
25.0000 mg | Freq: Once | INTRAMUSCULAR | Status: AC
  Administered 2022-09-01: 25 mg via INTRAVENOUS
  Filled 2022-09-01: qty 1

## 2022-09-01 MED ORDER — HYDROMORPHONE HCL 1 MG/ML IJ SOLN
1.0000 mg | Freq: Once | INTRAMUSCULAR | Status: AC
  Administered 2022-09-01: 1 mg via INTRAVENOUS
  Filled 2022-09-01: qty 1

## 2022-09-01 MED ORDER — ZIKS ARTHRITIS PAIN RELIEF 0.025-1-12 % EX CREA
TOPICAL_CREAM | CUTANEOUS | 1 refills | Status: DC
  Filled 2022-09-01: qty 56.6, fill #0

## 2022-09-01 MED ORDER — METOCLOPRAMIDE HCL 5 MG/ML IJ SOLN
5.0000 mg | Freq: Once | INTRAMUSCULAR | Status: AC
  Administered 2022-09-01: 5 mg via INTRAVENOUS
  Filled 2022-09-01: qty 2

## 2022-09-01 MED ORDER — ONDANSETRON HCL 4 MG PO TABS
4.0000 mg | ORAL_TABLET | Freq: Four times a day (QID) | ORAL | 0 refills | Status: DC
  Filled 2022-09-01: qty 12, 3d supply, fill #0

## 2022-09-01 MED ORDER — SODIUM CHLORIDE 0.9 % IV BOLUS
1000.0000 mL | Freq: Once | INTRAVENOUS | Status: AC
  Administered 2022-09-01: 1000 mL via INTRAVENOUS

## 2022-09-01 NOTE — ED Triage Notes (Signed)
EMS stated, pt has abdominal pain . N/V/D . Went to [facility] and discharged and decide to come here. Pain started yesterday and went away and came back again.

## 2022-09-01 NOTE — ED Provider Notes (Signed)
[facility] EMERGENCY DEPARTMENT Provider Note   CSN: [id number] Arrival date & time: 09/01/22  9323     History  Chief Complaint  Patient presents with   Abdominal Pain   Nausea   Emesis   Diarrhea    Matthew Scott is a 42 y.o. male.  The history is provided by the patient and medical records. No language interpreter was used.  Abdominal Pain Associated symptoms: diarrhea and vomiting   Emesis Associated symptoms: abdominal pain and diarrhea   Diarrhea Associated symptoms: abdominal pain and vomiting      42 year old male significant history of gastroparesis, polysubstance use including marijuana use, prior surgical history of cholecystectomy, history of H. pylori who brought here via EMS for evaluation of abdominal pain.  Since yesterday patient has had pain across his abdomen with associate nausea and vomiting.  Pain is recurrent, felt similar to prior gastroparesis that he has had in the past.  Last marijuana use was yesterday.  No fever chest pain shortness of breath or productive cough no dysuria.  Was initially seen at [facility], ER yesterday for same but left due to long wait.  Home Medications Prior to Admission medications   Medication Sig Start Date End Date Taking? Authorizing Provider  amoxicillin-clavulanate (AUGMENTIN) 875-125 MG tablet Take 1 tablet by mouth every 12 (twelve) hours. Patient not taking: Reported on 11/11/2021 07/07/21   Petrucelli, Pleas Koch, PA-C  benzocaine (ORAJEL) 10 % mucosal gel Use as directed 1 Application in the mouth or throat as needed for mouth pain. 05/17/22   Mannie Stabile, PA-C  dicyclomine (BENTYL) 20 MG tablet Take 1 tablet (20 mg total) by mouth 3 (three) times daily as needed for spasms. Patient not taking: Reported on 11/11/2021 12/15/20   Long, Arlyss Repress, MD  naproxen (NAPROSYN) 500 MG tablet Take 1 tablet (500 mg total) by mouth 2 (two) times daily as needed for moderate pain. Patient not taking:  Reported on 11/11/2021 07/07/21   Petrucelli, Lelon Mast R, PA-C  naproxen (NAPROSYN) 500 MG tablet Take 1 tablet (500 mg total) by mouth 2 (two) times daily. 05/17/22   Mannie Stabile, PA-C  ondansetron (ZOFRAN-ODT) 4 MG disintegrating tablet Take 1 tablet (4 mg total) by mouth every 8 (eight) hours as needed for nausea or vomiting. 01/29/22   Haskel Schroeder, PA-C  pantoprazole (PROTONIX) 20 MG tablet Take 2 tablets (40 mg total) by mouth daily for 14 days. 12/09/20 05/24/21  Alvira Monday, MD  pantoprazole (PROTONIX) 20 MG tablet Take 20 mg by mouth 2 (two) times daily.    [provider]  promethazine (PHENERGAN) 25 MG tablet Take 1 tablet (25 mg total) by mouth every 6 (six) hours as needed for nausea or vomiting (IF continuing severe nausea, able to swallow tablet (may use as an alternative to zofran)). 12/09/20   Alvira Monday, MD  sucralfate (CARAFATE) 1 GM/10ML suspension Take 10 mLs (1 g total) by mouth 4 (four) times daily -  with meals and at bedtime. Patient not taking: Reported on 11/11/2021 01/15/21   Liberty Handy, PA-C      Allergies    Patient has no known allergies.    Review of Systems   Review of Systems  Gastrointestinal:  Positive for abdominal pain, diarrhea and vomiting.  All other systems reviewed and are negative.   Physical Exam Updated Vital Signs BP 123/70 (BP Location: Right Arm)   Pulse (!) 58   Temp 98.4 F (36.9 C) (Oral)  Resp 16   Ht 5\' 9"  (1.753 m)   Wt 84.4 kg   SpO2 96%   BMI 27.47 kg/m  Physical Exam Vitals and nursing note reviewed.  Constitutional:      General: He is not in acute distress.    Appearance: He is well-developed.  HENT:     Head: Atraumatic.  Eyes:     Conjunctiva/sclera: Conjunctivae normal.  Cardiovascular:     Rate and Rhythm: Normal rate and regular rhythm.  Pulmonary:     Effort: Pulmonary effort is normal.     Breath sounds: Normal breath sounds.  Abdominal:     General: Abdomen is flat.  Bowel sounds are normal.     Palpations: Abdomen is soft.     Tenderness: There is generalized abdominal tenderness. There is no guarding or rebound.     Hernia: No hernia is present.  Musculoskeletal:     Cervical back: Neck supple.  Skin:    Findings: No rash.  Neurological:     Mental Status: He is alert.     ED Results / Procedures / Treatments   Labs (all labs ordered are listed, but only abnormal results are displayed) Labs Reviewed  CBC WITH DIFFERENTIAL/PLATELET - Abnormal; Notable for the following components:      Result Value   RBC 3.77 (*)    Hemoglobin 12.4 (*)    HCT 36.9 (*)    Neutro Abs 8.8 (*)    All other components within normal limits  COMPREHENSIVE METABOLIC PANEL - Abnormal; Notable for the following components:   Glucose, Bld 105 (*)    Total Bilirubin 1.5 (*)    All other components within normal limits  LIPASE, BLOOD  URINALYSIS, ROUTINE W REFLEX MICROSCOPIC    EKG EKG Interpretation  Date/Time:  Thursday September 01 2022 10:15:13 EST Ventricular Rate:  58 PR Interval:  174 QRS Duration: 98 QT Interval:  402 QTC Calculation: 394 R Axis:   64 Text Interpretation: Sinus bradycardia Otherwise normal ECG When compared with ECG of 29-Jan-2022 11:32, No significant change since last tracing Confirmed by 31-Jan-2022 [phone]) on 09/01/2022 12:40:36 PM  Radiology CT Abdomen Pelvis W Contrast  Result Date: 08/31/2022 CLINICAL DATA:  Chronic abdominal pain and nausea and vomiting after remote cholecystectomy EXAM: CT ABDOMEN AND PELVIS WITH CONTRAST TECHNIQUE: Multidetector CT imaging of the abdomen and pelvis was performed using the standard protocol following bolus administration of intravenous contrast. RADIATION DOSE REDUCTION: This exam was performed according to the departmental dose-optimization program which includes automated exposure control, adjustment of the mA and/or kV according to patient size and/or use of iterative reconstruction technique.  CONTRAST:  09/02/2022 OMNIPAQUE IOHEXOL 300 MG/ML  SOLN COMPARISON:  01/29/2022 FINDINGS: Lower chest: No acute pleural or parenchymal lung disease. Hepatobiliary: Cholecystectomy. Mild hepatic steatosis. Stable hepatic cysts. Pancreas: Unremarkable. No pancreatic ductal dilatation or surrounding inflammatory changes. Spleen: Normal in size without focal abnormality. Adrenals/Urinary Tract: Stable nonobstructing right renal calculi measuring up to 4 mm. No left-sided calculi. No obstructive uropathy within either kidney. The adrenals and bladder are unremarkable. Stomach/Bowel: No bowel obstruction or ileus. Normal appendix right lower quadrant. No bowel wall thickening or inflammatory change. Vascular/Lymphatic: No significant vascular findings are present. No enlarged abdominal or pelvic lymph nodes. Reproductive: Prostate is unremarkable. Other: No free fluid or free intraperitoneal gas. No abdominal wall hernia. Musculoskeletal: No acute or destructive bony lesions. Reconstructed images demonstrate no additional findings. IMPRESSION: 1. No acute intra-abdominal or intrapelvic process. 2. Stable nonobstructing right  renal calculi. Electronically Signed   By: Randa Ngo M.D.   On: 08/31/2022 18:14    Procedures Procedures    Medications Ordered in ED Medications  acetaminophen (TYLENOL) tablet 1,000 mg (1,000 mg Oral Given 09/01/22 1105)    ED Course/ Medical Decision Making/ A&P                             Medical Decision Making Amount and/or Complexity of Data Reviewed Labs: ordered. ECG/medicine tests: ordered.  Risk OTC drugs.   BP 123/70 (BP Location: Right Arm)   Pulse (!) 58   Temp 98.4 F (36.9 C) (Oral)   Resp 16   Ht 5\' 9"  (1.753 m)   Wt 84.4 kg   SpO2 96%   BMI 27.47 kg/m   68:44 PM 42 year old male significant history of gastroparesis, polysubstance use including marijuana use, prior surgical history of cholecystectomy, history of H. pylori who brought here via EMS  for evaluation of abdominal pain.  Since yesterday patient has had pain across his abdomen with associate nausea and vomiting.  Pain is recurrent, felt similar to prior gastroparesis that he has had in the past.  Last marijuana use was yesterday.  No fever chest pain shortness of breath or productive cough no dysuria.  Was initially seen at [facility], ER yesterday for same but left due to long wait.  On exam this is a well-appearing male laying in bed in no acute discomfort.  He does have mild generalized tenderness on palpation no guarding no rebound tenderness.  Heart with normal rate and rhythm, lungs clear to auscultation.  Patient does not appear to be dehydrated.  Vital signs reviewed and are reassuring.\  -Labs ordered, independently viewed and interpreted by me.  Labs remarkable for normal electrolytes, normal lipase -The patient was maintained on a cardiac monitor.  I personally viewed and interpreted the cardiac monitored which showed an underlying rhythm of: sinus brady -This patient presents to the ED for concern of abd pain, this involves an extensive number of treatment options, and is a complaint that carries with it a high risk of complications and morbidity.  The differential diagnosis includes cannabinoid hyperemesis, gastritis, gastroparesis, pancreatitis, appendicitis, diverticular disease -Co morbidities that complicate the patient evaluation includes gastroparesis, marijuana use, chronic abd pain -Treatment includes tylenol -Reevaluation of the patient after these medicines showed that the patient improved -PCP office notes or outside notes reviewed -Escalation to admission/observation considered: patients feels much better, is comfortable with discharge, and will follow up with PCP -Prescription medication considered, patient comfortable with capsaicin cream and zofran -Social Determinant of Health considered which includes drug use  Patient was given Tylenol with improvement  of symptoms.  Recommend patient to avoid marijuana use, to use capsaicin cream as needed for pain, take Zofran as needed for nausea and return if symptoms worsen.  Patient voiced understanding and agrees with plan.         Final Clinical Impression(s) / ED Diagnoses Final diagnoses:  Cannabinoid hyperemesis syndrome    Rx / DC Orders ED Discharge Orders          Ordered    Capsaicin-Menthol-Methyl Sal (CAPSAICIN-METHYL SAL-MENTHOL) 0.025-1-12 % CREA        09/01/22 1309    ondansetron (ZOFRAN) 4 MG tablet  Every 6 hours        09/01/22 1309              Domenic Moras,  PA-C 09/01/22 1311    Dorie Rank, MD 09/02/22 [phone]

## 2022-09-01 NOTE — ED Notes (Signed)
Patient tolerating drinking fluids.

## 2022-09-01 NOTE — ED Provider Triage Note (Signed)
Emergency Medicine Provider Triage Evaluation Note  Matthew Scott , a 42 y.o. male  was evaluated in triage.  Pt complains of abdominal pain.  Seen earlier today at [facility] and diagnosed with likely cannabis hyperemesis.  He also has history of gastroparesis.  He states that he was unable to pick up his capsaicin cream and Zofran and came in to the ED for further evaluation and for pain relief.  Has been told in the past that he had cannabis hyperemesis but states he continues to smoke, last use was 2 days ago.  Denies fevers or chills, no hematemesis, no hematochezia.  Review of Systems  Positive: Abdominal pain, vomiting Negative: Fevers, chest pain, shortness of breath  Physical Exam  BP (!) 131/92   Pulse 97   Temp 97.6 F (36.4 C)   Resp (!) 22   SpO2 92%  Gen:   Awake, and lying on the floor with his head covered in a blanket Resp:  Normal effort  MSK:   Moves extremities without difficulty  Other:    Medical Decision Making  Medically screening exam initiated at 5:42 PM.  Appropriate orders placed.  Matthew Scott was informed that the remainder of the evaluation will be completed by another provider, this initial triage assessment does not replace that evaluation, and the importance of remaining in the ED until their evaluation is complete.     Gwenevere Abbot, [place] 09/01/22 [phone]

## 2022-09-01 NOTE — Discharge Instructions (Addendum)
You have been evaluated for your symptoms.  Fortunately your labs are normal.  Your pain is likely due to side effect of marijuana use.  Apply capsaicin cream to abdominal wall as needed for pain. Take zofran for nausea.  Avoid marijuana use.

## 2022-09-01 NOTE — ED Provider Triage Note (Signed)
Emergency Medicine Provider Triage Evaluation Note  Matthew Scott , a 42 y.o. male  was evaluated in triage.  Pt complains of abd pain. Diffused abd pain with nausea and vomiting since yesterday.  Has had similar pain like this in the past.  Hx of marijuana use and gastroparesis.  Seen at [facility] for same yesterday.  No fever, cp, sob, cough, dysuria  Review of Systems  Positive: As above  Negative: As above  Physical Exam  BP 123/70 (BP Location: Right Arm)   Pulse (!) 58   Temp 98.4 F (36.9 C) (Oral)   Resp 16   Ht 5\' 9"  (1.753 m)   Wt 84.4 kg   SpO2 96%   BMI 27.47 kg/m  Gen:   Awake, no distress   Resp:  Normal effort  MSK:   Moves extremities without difficulty  Other:    Medical Decision Making  Medically screening exam initiated at 10:03 AM.  Appropriate orders placed.  Matthew Scott was informed that the remainder of the evaluation will be completed by another provider, this initial triage assessment does not replace that evaluation, and the importance of remaining in the ED until their evaluation is complete.  Suspect cannabinoid hyperemesis syndrome   Domenic Moras, PA-C 09/01/22 1004

## 2022-09-01 NOTE — ED Triage Notes (Signed)
Pt arrives via [facility] from home for emesis. Pt recently seen at [facility] and diagnosed with cannabis hyperemesis. Pt unable to fill prescriptions. Given 4 mg IM zofran enroute.

## 2022-09-02 ENCOUNTER — Emergency Department (HOSPITAL_COMMUNITY): Payer: Medicaid Other

## 2022-09-02 ENCOUNTER — Other Ambulatory Visit: Payer: Self-pay

## 2022-09-02 ENCOUNTER — Emergency Department (HOSPITAL_COMMUNITY)
Admission: EM | Admit: 2022-09-02 | Discharge: 2022-09-02 | Disposition: A | Discharge status: Home / Self Care | Payer: Medicaid Other | Attending: Emergency Medicine | Admitting: Emergency Medicine

## 2022-09-02 ENCOUNTER — Encounter (HOSPITAL_COMMUNITY): Payer: Self-pay

## 2022-09-02 DIAGNOSIS — R1115 Cyclical vomiting syndrome unrelated to migraine: Secondary | ICD-10-CM

## 2022-09-02 DIAGNOSIS — K529 Noninfective gastroenteritis and colitis, unspecified: Secondary | ICD-10-CM | POA: Diagnosis not present

## 2022-09-02 DIAGNOSIS — I10 Essential (primary) hypertension: Secondary | ICD-10-CM | POA: Insufficient documentation

## 2022-09-02 DIAGNOSIS — F17290 Nicotine dependence, other tobacco product, uncomplicated: Secondary | ICD-10-CM | POA: Insufficient documentation

## 2022-09-02 LAB — COMPREHENSIVE METABOLIC PANEL
ALT: 34 U/L (ref 0–44)
AST: 66 U/L — ABNORMAL HIGH (ref 15–41)
Albumin: 4.4 g/dL (ref 3.5–5.0)
Alkaline Phosphatase: 64 U/L (ref 38–126)
Anion gap: 14 (ref 5–15)
BUN: 17 mg/dL (ref 6–20)
CO2: 20 mmol/L — ABNORMAL LOW (ref 22–32)
Calcium: 9.7 mg/dL (ref 8.9–10.3)
Chloride: 103 mmol/L (ref 98–111)
Creatinine, Ser: 1.27 mg/dL — ABNORMAL HIGH (ref 0.61–1.24)
GFR, Estimated: >60 mL/min (ref >60)
Glucose, Bld: 136 mg/dL — ABNORMAL HIGH (ref 70–99)
Potassium: 3.8 mmol/L (ref 3.5–5.1)
Sodium: 137 mmol/L (ref 135–145)
Total Bilirubin: 2.4 mg/dL — ABNORMAL HIGH (ref 0.3–1.2)
Total Protein: 7.6 g/dL (ref 6.5–8.1)

## 2022-09-02 LAB — CBC WITH DIFFERENTIAL/PLATELET
Abs Immature Granulocytes: 0.04 10*3/uL (ref 0.00–0.07)
Basophils Absolute: 0 10*3/uL (ref 0.0–0.1)
Basophils Relative: 0 %
Eosinophils Absolute: 0 10*3/uL (ref 0.0–0.5)
Eosinophils Relative: 0 %
HCT: 35.9 % — ABNORMAL LOW (ref 39.0–52.0)
Hemoglobin: 12.3 g/dL — ABNORMAL LOW (ref 13.0–17.0)
Immature Granulocytes: 0 %
Lymphocytes Relative: 9 %
Lymphs Abs: 1.1 10*3/uL (ref 0.7–4.0)
MCH: 32.9 pg (ref 26.0–34.0)
MCHC: 34.3 g/dL (ref 30.0–36.0)
MCV: 96 fL (ref 80.0–100.0)
Monocytes Absolute: 0.5 10*3/uL (ref 0.1–1.0)
Monocytes Relative: 4 %
Neutro Abs: 10.5 10*3/uL — ABNORMAL HIGH (ref 1.7–7.7)
Neutrophils Relative %: 87 %
Platelets: 288 10*3/uL (ref 150–400)
RBC: 3.74 MIL/uL — ABNORMAL LOW (ref 4.22–5.81)
RDW: 12.6 % (ref 11.5–15.5)
WBC: 12.1 10*3/uL — ABNORMAL HIGH (ref 4.0–10.5)
nRBC: 0 % (ref 0.0–0.2)

## 2022-09-02 LAB — LIPASE, BLOOD: Lipase: 27 U/L (ref 11–51)

## 2022-09-02 MED ORDER — SUCRALFATE 1 G PO TABS
1.0000 g | ORAL_TABLET | Freq: Three times a day (TID) | ORAL | 0 refills | Status: DC
  Filled 2022-09-02 – 2022-09-12 (×2): qty 21, 7d supply, fill #0

## 2022-09-02 MED ORDER — ONDANSETRON 4 MG PO TBDP
4.0000 mg | ORAL_TABLET | Freq: Three times a day (TID) | ORAL | 0 refills | Status: DC | PRN
  Filled 2022-09-02: qty 20, 7d supply, fill #0

## 2022-09-02 MED ORDER — ONDANSETRON 4 MG PO TBDP
8.0000 mg | ORAL_TABLET | Freq: Once | ORAL | Status: AC
  Administered 2022-09-02: 8 mg via ORAL
  Filled 2022-09-02: qty 2

## 2022-09-02 MED ORDER — LIDOCAINE VISCOUS HCL 2 % MT SOLN
15.0000 mL | Freq: Once | OROMUCOSAL | Status: AC
  Administered 2022-09-02: 15 mL via ORAL
  Filled 2022-09-02: qty 15

## 2022-09-02 MED ORDER — KETOROLAC TROMETHAMINE 15 MG/ML IJ SOLN
15.0000 mg | Freq: Once | INTRAMUSCULAR | Status: AC
  Administered 2022-09-02: 15 mg via INTRAVENOUS
  Filled 2022-09-02: qty 1

## 2022-09-02 MED ORDER — DROPERIDOL 2.5 MG/ML IJ SOLN
2.5000 mg | Freq: Once | INTRAMUSCULAR | Status: AC
  Administered 2022-09-02: 2.5 mg via INTRAVENOUS
  Filled 2022-09-02: qty 2

## 2022-09-02 MED ORDER — PROCHLORPERAZINE EDISYLATE 10 MG/2ML IJ SOLN
10.0000 mg | Freq: Once | INTRAMUSCULAR | Status: AC
  Administered 2022-09-02: 10 mg via INTRAVENOUS
  Filled 2022-09-02: qty 2

## 2022-09-02 MED ORDER — MYLANTA MAXIMUM STRENGTH 400-400-40 MG/5ML PO SUSP
15.0000 mL | Freq: Four times a day (QID) | ORAL | 0 refills | Status: AC | PRN
  Filled 2022-09-02 – 2022-09-05 (×2): qty 355, 6d supply, fill #0

## 2022-09-02 MED ORDER — IOHEXOL 350 MG/ML SOLN
75.0000 mL | Freq: Once | INTRAVENOUS | Status: AC | PRN
  Administered 2022-09-02: 75 mL via INTRAVENOUS

## 2022-09-02 MED ORDER — ALUM & MAG HYDROXIDE-SIMETH 200-200-20 MG/5ML PO SUSP
30.0000 mL | Freq: Once | ORAL | Status: AC
  Administered 2022-09-02: 30 mL via ORAL
  Filled 2022-09-02: qty 30

## 2022-09-02 MED ORDER — SODIUM CHLORIDE 0.9 % IV BOLUS
1000.0000 mL | Freq: Once | INTRAVENOUS | Status: AC
  Administered 2022-09-02: 1000 mL via INTRAVENOUS

## 2022-09-02 MED ORDER — HYDROCODONE-ACETAMINOPHEN 5-325 MG PO TABS
1.0000 | ORAL_TABLET | Freq: Once | ORAL | Status: AC
  Administered 2022-09-02: 1 via ORAL
  Filled 2022-09-02: qty 1

## 2022-09-02 NOTE — Discharge Instructions (Signed)
It was a pleasure caring for you today in the emergency department.  Please return to the emergency department for any worsening or worrisome symptoms.  Please refrain from [facility] use

## 2022-09-02 NOTE — ED Provider Notes (Signed)
[facility]-EMERGENCY DEPT Provider Note  CSN: [id number] Arrival date & time: 09/01/22 1716  Chief Complaint(s) Emesis  [name] is a 42 y.o. male with past medical history as below, significant for THC use, cyclical vomiting syndrome, gastritis, gastroparesis, polysubstance abuse who presents to the ED with complaint of pain, nausea and vomiting.  he was seen in the ED earlier today, similar complaint.  Labs reviewed and were stable.  He was started on antiemetic and capsaicin cream which has not been able to pick up yet.  Patient with worsening nausea and vomiting after got home.  Has been able tolerate p.o. intake since getting home.  No fevers or chills.  Pain is sharp, stabbing, burning, epigastrium primarily.  Similar prior episodes.  No diarrhea.  No fevers.  No suspicious p.o.  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   Qualifier: Diagnosis of  By: Daphine Deutscher FNP, Nykedtra     Chronic abdominal pain    Cyclical vomiting syndrome    GASTRITIS 12/09/2009   Qualifier: Diagnosis of  By: Daphine Deutscher FNP, Nykedtra     Gastroparesis    HELICOBACTER PYLORI INFECTION 12/14/2009   Qualifier: Diagnosis of  By: Levon Hedger     Nausea and vomiting    chronic, recurrent   Polysubstance abuse [facility])    Patient Active Problem List   Diagnosis Date Noted   Anemia 03/10/2016   Essential hypertension 12/22/2015   GERD (gastroesophageal reflux disease) 01/21/2015   Gastritis 12/19/2014   Alcohol abuse 12/14/2014   Gastroparesis 05/11/2012   Abdominal pain 05/06/2012   Polysubstance abuse (HCC) 05/06/2012   CHOLECYSTECTOMY, HX OF 11/25/2009   Home Medication(s) Prior to Admission medications   Medication Sig Start Date End Date Taking? Authorizing Provider  sucralfate (CARAFATE) 1 g tablet Take 1 tablet (1 g total) by mouth with breakfast, with lunch, and with evening meal for 7 days. 09/02/22 09/09/22 Yes Sloan Leiter, DO  benzocaine (ORAJEL) 10 % mucosal gel Use as directed 1 Application in the mouth or throat as needed for mouth pain. 05/17/22   Mannie Stabile, PA-C  Capsaicin-Menthol-Methyl Sal (CAPSAICIN-METHYL SAL-MENTHOL) 0.025-1-12 % CREA Apply cream to abdominal wall as needed for abdominal pain. Wash your hands and Avoid touching eyes or genital region after application of cream as it can cause irritation. 09/01/22   Fayrene Helper, PA-C  naproxen (NAPROSYN) 500 MG tablet Take 1 tablet (500 mg total) by mouth 2 (two) times daily. 05/17/22   Mannie Stabile, PA-C  ondansetron (ZOFRAN) 4 MG tablet Take 1 tablet (4 mg total) by mouth every 6 (six) hours. 09/01/22   Fayrene Helper, PA-C  ondansetron (ZOFRAN-ODT) 4 MG disintegrating tablet Take 1 tablet (4 mg total) by mouth every 8 (eight) hours as needed for nausea or vomiting. 01/29/22   Haskel Schroeder, PA-C  pantoprazole (PROTONIX) 20 MG tablet Take 2 tablets (40 mg total) by mouth daily for 14 days. 12/09/20 05/24/21  Alvira Monday, MD  pantoprazole (PROTONIX) 20 MG tablet Take 20 mg by mouth 2 (two) times daily.    [provider]  promethazine (PHENERGAN) 25 MG tablet Take 1 tablet (25 mg total) by mouth every 6 (six) hours as needed for nausea or vomiting (IF continuing severe nausea, able to swallow tablet (may use as an alternative to zofran)). 12/09/20   Alvira Monday, MD  Past Surgical History Past Surgical History:  Procedure Laterality Date   CHOLECYSTECTOMY  11/2009   Family History Family History  Problem Relation Age of Onset   Other Father    Diabetes Father    Hypertension Father    Pancreatic cancer Father    Colon cancer Neg Hx    Esophageal cancer Neg Hx    Liver cancer Neg Hx     Social History Social History   Tobacco Use   Smoking status: Some Days    Types: Cigars     Last attempt to quit: 12/15/2014    Years since quitting: 7.7   Smokeless tobacco: Never   Tobacco comments:    7 cigars infused with marijuana a day  Vaping Use   Vaping Use: Never used  Substance Use Topics   Alcohol use: No    Alcohol/week: 0.0 standard drinks of alcohol   Drug use: Yes    Types: Marijuana    Comment: Last used: 4 days ago per patient on 04/30/2018   Allergies Patient has no known allergies.  Review of Systems Review of Systems  Constitutional:  Negative for chills and fever.  HENT:  Negative for facial swelling and trouble swallowing.   Eyes:  Negative for photophobia and visual disturbance.  Respiratory:  Negative for cough and shortness of breath.   Cardiovascular:  Negative for chest pain and palpitations.  Gastrointestinal:  Positive for abdominal pain, nausea and vomiting. Negative for diarrhea.  Endocrine: Negative for polydipsia and polyuria.  Genitourinary:  Negative for difficulty urinating and hematuria.  Musculoskeletal:  Negative for gait problem and joint swelling.  Skin:  Negative for pallor and rash.  Neurological:  Negative for syncope and headaches.  Psychiatric/Behavioral:  Negative for agitation and confusion.     Physical Exam Vital Signs  I have reviewed the triage vital signs BP 129/63   Pulse 68   Temp 98.3 F (36.8 C)   Resp 18   SpO2 99%  Physical Exam Vitals and nursing note reviewed.  Constitutional:      General: He is not in acute distress.    Appearance: Normal appearance. He is well-developed. He is not ill-appearing.  HENT:     Head: Normocephalic and atraumatic.     Right Ear: External ear normal.     Left Ear: External ear normal.     Mouth/Throat:     Mouth: Mucous membranes are moist.  Eyes:     General: No scleral icterus. Cardiovascular:     Rate and Rhythm: Normal rate and regular rhythm.     Pulses: Normal pulses.     Heart sounds: Normal heart sounds.  Pulmonary:     Effort: Pulmonary effort is  normal. No respiratory distress.     Breath sounds: Normal breath sounds.  Abdominal:     General: Abdomen is flat.     Palpations: Abdomen is soft.     Tenderness: There is no abdominal tenderness. There is no guarding or rebound.  Musculoskeletal:        General: Normal range of motion.     Cervical back: Normal range of motion.     Right lower leg: No edema.     Left lower leg: No edema.  Skin:    General: Skin is warm and dry.     Capillary Refill: Capillary refill takes less than 2 seconds.  Neurological:     Mental Status: He is alert and oriented to person, place, and time.  GCS: GCS eye subscore is 4. GCS verbal subscore is 5. GCS motor subscore is 6.  Psychiatric:        Mood and Affect: Mood normal.        Behavior: Behavior normal.     ED Results and Treatments Labs (all labs ordered are listed, but only abnormal results are displayed) Labs Reviewed  CBC WITH DIFFERENTIAL/PLATELET - Abnormal; Notable for the following components:      Result Value   WBC 12.5 (*)    RBC 3.83 (*)    Hemoglobin 12.4 (*)    HCT 38.2 (*)    Neutro Abs 10.2 (*)    All other components within normal limits  COMPREHENSIVE METABOLIC PANEL - Abnormal; Notable for the following components:   CO2 21 (*)    Glucose, Bld 103 (*)    AST 52 (*)    Total Bilirubin 1.9 (*)    All other components within normal limits  LIPASE, BLOOD  URINALYSIS, ROUTINE W REFLEX MICROSCOPIC                                                                                                                          Radiology No results found.  Pertinent labs & imaging results that were available during my care of the patient were reviewed by me and considered in my medical decision making (see MDM for details).  Medications Ordered in ED Medications  capsaicin (ZOSTRIX) 0.025 % cream ( Topical Given 09/01/22 2226)  sodium chloride 0.9 % bolus 1,000 mL (0 mLs Intravenous Stopped 09/02/22 0012)  metoCLOPramide  (REGLAN) injection 5 mg (5 mg Intravenous Given 09/01/22 2227)  diphenhydrAMINE (BENADRYL) injection 25 mg (25 mg Intravenous Given 09/01/22 2227)  famotidine (PEPCID) IVPB 20 mg premix (0 mg Intravenous Stopped 09/01/22 2250)  HYDROmorphone (DILAUDID) injection 1 mg (1 mg Intravenous Given 09/01/22 2247)                                                                                                                                     Procedures Procedures  (including critical care time)  Medical Decision Making / ED Course   MDM:  SHIVANSH HARDAWAY is a 42 y.o. male with past medical history as below, significant for THC use, cyclical vomiting syndrome, gastritis, gastroparesis, polysubstance abuse who presents to the ED with complaint of pain, nausea and vomiting.. The complaint involves an  extensive differential diagnosis and also carries with it a high risk of complications and morbidity.  Serious etiology was considered. Ddx includes but is not limited to:    On initial assessment the patient is: Appears to be in acute discomfort, HDS.  Abdomen is soft, nonperitoneal. Vital signs and nursing notes were reviewed    Patient with multiple ED visits with similar complaints.  He has been unable to pick up prescriptions. Nausea and vomiting abdominal pain. Given GI cocktail, antiemetic, analgesia. Get screening labs. Labs reviewed, T. bili was mildly elevated, similar to his baseline.  He has a mild leukocytosis, likely secondary to vomiting.  Does not appear to be septic.  Hemoglobin similar baseline He had CT imaging 2 days ago, not feel repeat imaging is needed at this time given stable labs, abdomen is benign Patient feeling much better after recheck.  Able to eat sandwich, dinner. Advised to refrain from [facility] and tobacco use. He has Zofran prescription, will also give Carafate.  The patient improved significantly and was discharged in stable condition. Detailed discussions were had with  the patient regarding current findings, and need for close f/u with PCP or on call doctor. The patient has been instructed to return immediately if the symptoms worsen in any way for re-evaluation. Patient verbalized understanding and is in agreement with current care plan. All questions answered prior to discharge.    Additional history obtained: -Additional history obtained from na -External records from outside source obtained and reviewed including: Chart review including previous notes, labs, imaging, consultation notes including prior ED visits, prior labs and imaging at home medications,   Lab Tests: -I ordered, reviewed, and interpreted labs.   The pertinent results include:   Labs Reviewed  CBC WITH DIFFERENTIAL/PLATELET - Abnormal; Notable for the following components:      Result Value   WBC 12.5 (*)    RBC 3.83 (*)    Hemoglobin 12.4 (*)    HCT 38.2 (*)    Neutro Abs 10.2 (*)    All other components within normal limits  COMPREHENSIVE METABOLIC PANEL - Abnormal; Notable for the following components:   CO2 21 (*)    Glucose, Bld 103 (*)    AST 52 (*)    Total Bilirubin 1.9 (*)    All other components within normal limits  LIPASE, BLOOD  URINALYSIS, ROUTINE W REFLEX MICROSCOPIC    Notable for as above  EKG   EKG Interpretation  Date/Time:    Ventricular Rate:    PR Interval:    QRS Duration:   QT Interval:    QTC Calculation:   R Axis:     Text Interpretation:           Imaging Studies ordered: I ordered imaging studies including not applicable   Medicines ordered and prescription drug management: Meds ordered this encounter  Medications   capsaicin (ZOSTRIX) 0.025 % cream   sodium chloride 0.9 % bolus 1,000 mL   metoCLOPramide (REGLAN) injection 5 mg   diphenhydrAMINE (BENADRYL) injection 25 mg   famotidine (PEPCID) IVPB 20 mg premix   HYDROmorphone (DILAUDID) injection 1 mg   sucralfate (CARAFATE) 1 g tablet    Sig: Take 1 tablet (1 g total)  by mouth with breakfast, with lunch, and with evening meal for 7 days.    Dispense:  21 tablet    Refill:  0    -I have reviewed the patients home medicines and have made adjustments as needed   Consultations Obtained:  Not applicable  Cardiac Monitoring: The patient was maintained on a cardiac monitor.  I personally viewed and interpreted the cardiac monitored which showed an underlying rhythm of: nsr  Social Determinants of Health:  Diagnosis or treatment significantly limited by social determinants of health: current smoker and polysubstance abuse Counseled patient for approximately 3 minutes regarding smoking cessation. Discussed risks of smoking and how they applied and affected their visit here today. Patient not ready to quit at this time, however will follow up with their primary doctor when they are.   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    Final Clinical Impression(s) / ED Diagnoses Final diagnoses:  Abdominal pain, unspecified abdominal location  Nausea and vomiting, unspecified vomiting type  Marijuana use     This chart was dictated using voice recognition software.  Despite best efforts to  proofread,  errors can occur which can change the documentation meaning.    Jeanell Sparrow, DO 09/02/22 [phone]

## 2022-09-02 NOTE — ED Provider Triage Note (Signed)
Emergency Medicine Provider Triage Evaluation Note  OTHELL JAIME , a 42 y.o. male  was evaluated in triage.  Pt complains of abdominal pain, nausea, vomiting.  Patient states symptoms been present for the past 3 days of which he has been seen twice.  Patient with history of polysubstance abuse, gastritis, cyclical vomiting syndrome, THC use, gastroparesis.  States he was feeling well upon discharge from [facility] earlier today but nausea, vomiting and abdominal pain returned.  Denies fever, urinary symptoms, change in bowel habits.  Review of Systems  Positive: See above Negative:   Physical Exam  BP (!) 158/115 (BP Location: Left Arm)   Pulse (!) 102   Resp (!) 26   SpO2 95%  Gen:   Awake, no distress   Resp:  Normal effort  MSK:   Moves extremities without difficulty  Other:  Right-sided abdominal tenderness.  No CVA tenderness.  Medical Decision Making  Medically screening exam initiated at 1:25 PM.  Appropriate orders placed.  DARYEL KENNETH was informed that the remainder of the evaluation will be completed by another provider, this initial triage assessment does not replace that evaluation, and the importance of remaining in the ED until their evaluation is complete.     Wilnette Kales, [place] 09/02/22 1326

## 2022-09-02 NOTE — ED Provider Notes (Signed)
[place] EMERGENCY DEPARTMENT AT [facility] Provider Note  CSN: [id number] Arrival date & time: 09/02/22 1245  Chief Complaint(s) Abdominal Pain, Nausea, and Emesis  [name] is a 42 y.o. male with a history of cyclic vomiting syndrome, recurrent gastritis, polysubstance abuse presenting to the emergency department with vomiting.  Patient reports diffuse abdominal pain, nausea, vomiting.  He has been to the emergency department twice in the last 3 days for similar episode.  He denies any fevers or chills.  He denies any blood in his vomit or blood in his stool.  He denies ongoing cannabis use.  He also reports some diarrhea and is concerned he might have a viral infection.  He reports he is compliant with his home medications.  He reports that it feels similar to similar episodes.   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   Qualifier: Diagnosis of  By: Daphine Deutscher FNP, Nykedtra     Chronic abdominal pain    Cyclical vomiting syndrome    GASTRITIS 12/09/2009   Qualifier: Diagnosis of  By: Daphine Deutscher FNP, Nykedtra     Gastroparesis    HELICOBACTER PYLORI INFECTION 12/14/2009   Qualifier: Diagnosis of  By: Levon Hedger     Nausea and vomiting    chronic, recurrent   Polysubstance abuse [facility])    Patient Active Problem List   Diagnosis Date Noted   Anemia 03/10/2016   Essential hypertension 12/22/2015   GERD (gastroesophageal reflux disease) 01/21/2015   Gastritis 12/19/2014   Alcohol abuse 12/14/2014   Gastroparesis 05/11/2012   Abdominal pain 05/06/2012   Polysubstance abuse (HCC) 05/06/2012   CHOLECYSTECTOMY, HX OF 11/25/2009   Home Medication(s) Prior to Admission medications   Medication Sig Start Date End Date Taking? Authorizing Provider  alum & mag hydroxide-simeth (MYLANTA MAXIMUM STRENGTH) 400-400-40 MG/5ML suspension Take 15 mLs by mouth every 6 (six) hours as needed for indigestion. 09/02/22  Yes  Lonell Grandchild, MD  benzocaine (ORAJEL) 10 % mucosal gel Use as directed 1 Application in the mouth or throat as needed for mouth pain. 05/17/22   Mannie Stabile, PA-C  Capsaicin-Menthol-Methyl Sal (CAPSAICIN-METHYL SAL-MENTHOL) 0.025-1-12 % CREA Apply cream to abdominal wall as needed for abdominal pain. Wash your hands and Avoid touching eyes or genital region after application of cream as it can cause irritation. 09/01/22   Fayrene Helper, PA-C  naproxen (NAPROSYN) 500 MG tablet Take 1 tablet (500 mg total) by mouth 2 (two) times daily. 05/17/22   Mannie Stabile, PA-C  ondansetron (ZOFRAN) 4 MG tablet Take 1 tablet (4 mg total) by mouth every 6 (six) hours. 09/01/22   Fayrene Helper, PA-C  ondansetron (ZOFRAN-ODT) 4 MG disintegrating tablet Take 1 tablet (4 mg total) by mouth every 8 (eight) hours as needed for nausea or vomiting. 09/02/22   Lonell Grandchild, MD  pantoprazole (PROTONIX) 20 MG tablet Take 2 tablets (40 mg total) by mouth daily for 14 days. 12/09/20 05/24/21  Alvira Monday, MD  pantoprazole (PROTONIX) 20 MG tablet Take 20 mg by mouth 2 (two) times daily.    [provider]  promethazine (PHENERGAN) 25 MG tablet Take 1 tablet (25 mg total) by mouth every 6 (six) hours as needed for nausea or vomiting (IF continuing severe nausea, able to swallow tablet (may use as an alternative to zofran)). 12/09/20   Alvira Monday, MD  sucralfate (CARAFATE) 1 g tablet Take 1 tablet (1 g total) by mouth with  breakfast, with lunch, and with evening meal for 7 days. 09/02/22 09/09/22  Sloan Leiter, DO                                                                                                                                    Past Surgical History Past Surgical History:  Procedure Laterality Date   CHOLECYSTECTOMY  11/2009   Family History Family History  Problem Relation Age of Onset   Other Father    Diabetes Father    Hypertension Father    Pancreatic cancer Father     Colon cancer Neg Hx    Esophageal cancer Neg Hx    Liver cancer Neg Hx     Social History Social History   Tobacco Use   Smoking status: Some Days    Types: Cigars    Last attempt to quit: 12/15/2014    Years since quitting: 7.7   Smokeless tobacco: Never   Tobacco comments:    7 cigars infused with marijuana a day  Vaping Use   Vaping Use: Never used  Substance Use Topics   Alcohol use: No    Alcohol/week: 0.0 standard drinks of alcohol   Drug use: Yes    Types: Marijuana    Comment: Last used: 4 days ago per patient on 04/30/2018   Allergies Patient has no known allergies.  Review of Systems Review of Systems  All other systems reviewed and are negative.   Physical Exam Vital Signs  I have reviewed the triage vital signs BP (!) 156/74 (BP Location: Right Arm)   Pulse 62   Temp 98.8 F (37.1 C) (Oral)   Resp (!) 26   SpO2 99%  Physical Exam Vitals and nursing note reviewed.  Constitutional:      General: He is not in acute distress.    Appearance: Normal appearance.  HENT:     Mouth/Throat:     Mouth: Mucous membranes are moist.  Eyes:     Conjunctiva/sclera: Conjunctivae normal.  Cardiovascular:     Rate and Rhythm: Normal rate and regular rhythm.  Pulmonary:     Effort: Pulmonary effort is normal. No respiratory distress.     Breath sounds: Normal breath sounds.  Abdominal:     General: Abdomen is flat.     Palpations: Abdomen is soft.     Tenderness: There is no abdominal tenderness.  Musculoskeletal:     Right lower leg: No edema.     Left lower leg: No edema.  Skin:    General: Skin is warm and dry.     Capillary Refill: Capillary refill takes less than 2 seconds.  Neurological:     Mental Status: He is alert and oriented to person, place, and time. Mental status is at baseline.  Psychiatric:        Mood and Affect: Mood normal.        Behavior: Behavior normal.  ED Results and Treatments Labs (all labs ordered are listed, but only  abnormal results are displayed) Labs Reviewed  COMPREHENSIVE METABOLIC PANEL - Abnormal; Notable for the following components:      Result Value   CO2 20 (*)    Glucose, Bld 136 (*)    Creatinine, Ser 1.27 (*)    AST 66 (*)    Total Bilirubin 2.4 (*)    All other components within normal limits  CBC WITH DIFFERENTIAL/PLATELET - Abnormal; Notable for the following components:   WBC 12.1 (*)    RBC 3.74 (*)    Hemoglobin 12.3 (*)    HCT 35.9 (*)    Neutro Abs 10.5 (*)    All other components within normal limits  LIPASE, BLOOD  URINALYSIS, ROUTINE W REFLEX MICROSCOPIC  RAPID URINE DRUG SCREEN, HOSP PERFORMED                                                                                                                          Radiology CT Abdomen Pelvis W Contrast  Result Date: 09/02/2022 CLINICAL DATA:  Rt sided abd pain from n/v EXAM: CT ABDOMEN AND PELVIS WITH CONTRAST TECHNIQUE: Multidetector CT imaging of the abdomen and pelvis was performed using the standard protocol following bolus administration of intravenous contrast. RADIATION DOSE REDUCTION: This exam was performed according to the departmental dose-optimization program which includes automated exposure control, adjustment of the mA and/or kV according to patient size and/or use of iterative reconstruction technique. CONTRAST:  30mL OMNIPAQUE IOHEXOL 350 MG/ML SOLN COMPARISON:  Multiple priors including most recent CT January 17 ,2024. FINDINGS: Lower chest: No acute abnormality. Hepatobiliary: Hepatic cysts. Probable mild hepatic steatosis. Gallbladder surgically absent. No biliary ductal dilation. Pancreas: No pancreatic ductal dilation or evidence of acute inflammation. Spleen: No splenomegaly. Adrenals/Urinary Tract: Bilateral adrenal glands appear normal. Stable nonobstructive right renal calculi measuring up to 4 mm. No hydronephrosis. Urinary bladder is unremarkable for degree of distension. Stomach/Bowel: Stomach is  unremarkable for degree of distension. No pathologic dilation of small or large bowel. The appendix is not confidently identified minutes entirety however there is no evidence pericecal inflammation. Scattered prominent fluid-filled loops of small bowel with bowel wall thickening for in the right lower quadrant instance on image 46/3. Vascular/Lymphatic: Normal caliber abdominal aorta. Smooth IVC contours. No pathologically enlarged abdominal or pelvic lymph nodes. Reproductive: Prostate is unremarkable. Other: No significant abdominopelvic free fluid. Musculoskeletal: No acute osseous abnormality. IMPRESSION: 1. Scattered prominent fluid-filled loops of small bowel with bowel wall thickening in the right lower quadrant, suggestive of enteritis. 2. Appendix is not confidently visualized however there is no convincing evidence of acute appendicitis. If clinical concern for appendicitis suggest repeat CT with oral and IV contrast material. 3. Stable nonobstructive right renal calculi measuring up to 4 mm. 4. Probable mild hepatic steatosis. Electronically Signed   By: Dahlia Bailiff M.D.   On: 09/02/2022 16:28    Pertinent labs & imaging results that were available  during my care of the patient were reviewed by me and considered in my medical decision making (see MDM for details).  Medications Ordered in ED Medications  ondansetron (ZOFRAN-ODT) disintegrating tablet 8 mg (8 mg Oral Given 09/02/22 1918)  iohexol (OMNIPAQUE) 350 MG/ML injection 75 mL (75 mLs Intravenous Contrast Given 09/02/22 1603)  sodium chloride 0.9 % bolus 1,000 mL (0 mLs Intravenous Stopped 09/02/22 2034)  droperidol (INAPSINE) 2.5 MG/ML injection 2.5 mg (2.5 mg Intravenous Given 09/02/22 1924)  ketorolac (TORADOL) 15 MG/ML injection 15 mg (15 mg Intravenous Given 09/02/22 1918)  prochlorperazine (COMPAZINE) injection 10 mg (10 mg Intravenous Given 09/02/22 2047)  alum & mag hydroxide-simeth (MAALOX/MYLANTA) 200-200-20 MG/5ML suspension 30  mL (30 mLs Oral Given 09/02/22 2131)    And  lidocaine (XYLOCAINE) 2 % viscous mouth solution 15 mL (15 mLs Oral Given 09/02/22 2132)  HYDROcodone-acetaminophen (NORCO/VICODIN) 5-325 MG per tablet 1 tablet (1 tablet Oral Given 09/02/22 2131)                                                                                                                                     Procedures Procedures  (including critical care time)  Medical Decision Making / ED Course   MDM:  42 year old male presenting to the emergency department with abdominal pain.  Patient overall is well-appearing, initially with mild tachycardia which resolved on recheck without intervention.  Suspect symptoms are most likely related to chronic cyclic vomiting syndrome.  His exam is benign.  CT scan was obtained by triage provider which shows no acute surgical pathology, possible enteritis.  Labs with signs of mild dehydration with mild AKI, mildly low CO2.  Patient has elevated bilirubin which seems chronic.  He also has elevated WBC count which seems unchanged from previous visits.  CT scan without evidence of other acute intra-abdominal pathology such as appendicitis, cholecystitis, pancreatitis.  Will treat his symptoms and p.o. trial.  If patient able to tolerate p.o. likely safe for discharged home with further outpatient symptom control.   Clinical Course as of 09/02/22 2246  Fri Sep 02, 2022  2244 Able to control patient's symptoms after treatment with multiple antiemetics, Maalox, Toradol, Norco.  Discussed results with patient including signs of enteritis on CT scan.  Advised follow-up with his primary physician for further management.  Will prescribe Zofran and Maalox as patient reports he has no antiemetics at home at this time. Will discharge patient to home. All questions answered. Patient comfortable with plan of discharge. Return precautions discussed with patient and specified on the after visit summary.  [WS]     Clinical Course User Index [WS] Truett Mainland, Livingston Diones, MD     Additional history obtained: -Additional history obtained from family -External records from outside source obtained and reviewed including: Chart review including previous notes, labs, imaging, consultation notes including ED visits yesterday at [facility]   Lab Tests: -I ordered, reviewed, and interpreted labs.  The pertinent results include:   Labs Reviewed  COMPREHENSIVE METABOLIC PANEL - Abnormal; Notable for the following components:      Result Value   CO2 20 (*)    Glucose, Bld 136 (*)    Creatinine, Ser 1.27 (*)    AST 66 (*)    Total Bilirubin 2.4 (*)    All other components within normal limits  CBC WITH DIFFERENTIAL/PLATELET - Abnormal; Notable for the following components:   WBC 12.1 (*)    RBC 3.74 (*)    Hemoglobin 12.3 (*)    HCT 35.9 (*)    Neutro Abs 10.5 (*)    All other components within normal limits  LIPASE, BLOOD  URINALYSIS, ROUTINE W REFLEX MICROSCOPIC  RAPID URINE DRUG SCREEN, HOSP PERFORMED    Notable for mild aki, signs of dehydration, stable leukocytosis   Imaging Studies ordered: I ordered imaging studies including CT A/P On my interpretation imaging demonstrates enteritis I independently visualized and interpreted imaging. I agree with the radiologist interpretation   Medicines ordered and prescription drug management: Meds ordered this encounter  Medications   ondansetron (ZOFRAN-ODT) disintegrating tablet 8 mg   iohexol (OMNIPAQUE) 350 MG/ML injection 75 mL   sodium chloride 0.9 % bolus 1,000 mL   droperidol (INAPSINE) 2.5 MG/ML injection 2.5 mg   ketorolac (TORADOL) 15 MG/ML injection 15 mg   prochlorperazine (COMPAZINE) injection 10 mg   AND Linked Order Group    alum & mag hydroxide-simeth (MAALOX/MYLANTA) 200-200-20 MG/5ML suspension 30 mL    lidocaine (XYLOCAINE) 2 % viscous mouth solution 15 mL   HYDROcodone-acetaminophen (NORCO/VICODIN) 5-325 MG per tablet 1 tablet    ondansetron (ZOFRAN-ODT) 4 MG disintegrating tablet    Sig: Take 1 tablet (4 mg total) by mouth every 8 (eight) hours as needed for nausea or vomiting.    Dispense:  20 tablet    Refill:  0   alum & mag hydroxide-simeth (MYLANTA MAXIMUM STRENGTH) 400-400-40 MG/5ML suspension    Sig: Take 15 mLs by mouth every 6 (six) hours as needed for indigestion.    Dispense:  355 mL    Refill:  0    -I have reviewed the patients home medicines and have made adjustments as needed   Social Determinants of Health:  Diagnosis or treatment significantly limited by social determinants of health: polysubstance abuse   Reevaluation: After the interventions noted above, I reevaluated the patient and found that they have improved  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   Qualifier: Diagnosis of  By: Daphine Deutscher FNP, Nykedtra     Chronic abdominal pain    Cyclical vomiting syndrome    GASTRITIS 12/09/2009   Qualifier: Diagnosis of  By: Daphine Deutscher FNP, Nykedtra     Gastroparesis    HELICOBACTER PYLORI INFECTION 12/14/2009   Qualifier: Diagnosis of  By: Levon Hedger     Nausea and vomiting    chronic, recurrent   Polysubstance abuse (HCC)       Dispostion: Disposition decision including need for hospitalization was considered, and patient discharged from emergency department.    Final Clinical Impression(s) / ED Diagnoses Final diagnoses:  Cyclic vomiting syndrome  Enteritis     This chart was dictated using voice recognition software.  Despite best efforts to proofread,  errors can occur which can change the documentation meaning.    Lonell Grandchild, MD 09/02/22 2246

## 2022-09-02 NOTE — ED Triage Notes (Addendum)
Pt came in POV from home d/t Rt sided abd pain from n/v that he was seen for this morning at [facility]. A/Ox4. Rates pain 10/10, VSS.

## 2022-09-05 ENCOUNTER — Other Ambulatory Visit: Payer: Self-pay

## 2022-09-06 ENCOUNTER — Other Ambulatory Visit: Payer: Self-pay

## 2022-09-07 ENCOUNTER — Encounter (HOSPITAL_COMMUNITY): Payer: Self-pay | Admitting: Emergency Medicine

## 2022-09-07 ENCOUNTER — Emergency Department (HOSPITAL_COMMUNITY)
Admission: EM | Admit: 2022-09-07 | Discharge: 2022-09-07 | Discharge status: Against Medical Advice | Payer: Medicaid Other | Attending: Emergency Medicine | Admitting: Emergency Medicine

## 2022-09-07 ENCOUNTER — Other Ambulatory Visit: Payer: Self-pay

## 2022-09-07 DIAGNOSIS — K0889 Other specified disorders of teeth and supporting structures: Secondary | ICD-10-CM | POA: Insufficient documentation

## 2022-09-07 DIAGNOSIS — Z5321 Procedure and treatment not carried out due to patient leaving prior to being seen by health care provider: Secondary | ICD-10-CM | POA: Insufficient documentation

## 2022-09-07 NOTE — ED Provider Triage Note (Signed)
Emergency Medicine Provider Triage Evaluation Note  Matthew Scott , a 42 y.o. male  was evaluated in triage.  Pt complains of dental pain starting yesterday.  Pain localized to the right upper maxilla.  Reports difficulty opening mouth.  Denies chest pain, palpitations, shortness of breath, trismus, or difficulty swallowing.  Possible subjective fevers.  Unsure when last saw dentist.  Review of Systems  Positive:  Negative: See above  Physical Exam  BP (!) 141/84   Pulse 92   Temp (!) 97.4 F (36.3 C)   Resp 18   Ht 5\' 9"  (1.753 m)   Wt 82.6 kg   SpO2 98%   BMI 26.88 kg/m  Gen:   Awake, no distress   Resp:  Normal effort  MSK:   Moves extremities without difficulty  Other:  Mild tenderness with minimal swelling of the right upper gingiva.  No neck swelling.  No cardiac murmur.  Airway patent.  Handling secretions without difficulty.  No overt abscess appreciated.  No tongue elevation or swelling or deviation.  Medical Decision Making  Medically screening exam initiated at 4:20 PM.  Appropriate orders placed.  SAABIR BLYTH was informed that the remainder of the evaluation will be completed by another provider, this initial triage assessment does not replace that evaluation, and the importance of remaining in the ED until their evaluation is complete.     Prince Rome, PA-C 17/79/39 1622

## 2022-09-07 NOTE — ED Triage Notes (Addendum)
Pt c/o right upper dental pain starting yesterday. Denies any recent dental work. Pt states he has used tylenol with no relief. Endorses fevers at home. Denies difficulty breathing or abscess.

## 2022-09-09 ENCOUNTER — Other Ambulatory Visit: Payer: Self-pay

## 2022-09-12 ENCOUNTER — Other Ambulatory Visit: Payer: Self-pay | Admitting: Student

## 2022-09-12 ENCOUNTER — Other Ambulatory Visit (HOSPITAL_COMMUNITY): Payer: Self-pay

## 2022-09-12 ENCOUNTER — Other Ambulatory Visit: Payer: Self-pay

## 2022-09-19 ENCOUNTER — Other Ambulatory Visit: Payer: Self-pay

## 2022-10-09 ENCOUNTER — Emergency Department (HOSPITAL_COMMUNITY)
Admission: EM | Admit: 2022-10-09 | Discharge: 2022-10-09 | Disposition: A | Discharge status: Home / Self Care | Payer: Medicaid Other | Attending: Emergency Medicine | Admitting: Emergency Medicine

## 2022-10-09 ENCOUNTER — Emergency Department (HOSPITAL_COMMUNITY): Payer: Medicaid Other

## 2022-10-09 ENCOUNTER — Other Ambulatory Visit: Payer: Self-pay

## 2022-10-09 ENCOUNTER — Encounter (HOSPITAL_COMMUNITY): Payer: Self-pay

## 2022-10-09 DIAGNOSIS — Y92481 Parking lot as the place of occurrence of the external cause: Secondary | ICD-10-CM | POA: Diagnosis not present

## 2022-10-09 DIAGNOSIS — M79642 Pain in left hand: Secondary | ICD-10-CM | POA: Insufficient documentation

## 2022-10-09 DIAGNOSIS — M25512 Pain in left shoulder: Secondary | ICD-10-CM | POA: Diagnosis not present

## 2022-10-09 DIAGNOSIS — S01111A Laceration without foreign body of right eyelid and periocular area, initial encounter: Secondary | ICD-10-CM | POA: Insufficient documentation

## 2022-10-09 DIAGNOSIS — S0181XA Laceration without foreign body of other part of head, initial encounter: Secondary | ICD-10-CM | POA: Diagnosis not present

## 2022-10-09 DIAGNOSIS — Z23 Encounter for immunization: Secondary | ICD-10-CM | POA: Diagnosis not present

## 2022-10-09 DIAGNOSIS — S0993XA Unspecified injury of face, initial encounter: Secondary | ICD-10-CM | POA: Diagnosis present

## 2022-10-09 DIAGNOSIS — R222 Localized swelling, mass and lump, trunk: Secondary | ICD-10-CM | POA: Diagnosis not present

## 2022-10-09 DIAGNOSIS — M542 Cervicalgia: Secondary | ICD-10-CM | POA: Diagnosis not present

## 2022-10-09 LAB — I-STAT CHEM 8, ED
BUN: 7 mg/dL (ref 6–20)
Calcium, Ion: 1.2 mmol/L (ref 1.15–1.40)
Chloride: 106 mmol/L (ref 98–111)
Creatinine, Ser: 0.8 mg/dL (ref 0.61–1.24)
Glucose, Bld: 93 mg/dL (ref 70–99)
HCT: 38 % — ABNORMAL LOW (ref 39.0–52.0)
Hemoglobin: 12.9 g/dL — ABNORMAL LOW (ref 13.0–17.0)
Potassium: 4 mmol/L (ref 3.5–5.1)
Sodium: 142 mmol/L (ref 135–145)
TCO2: 27 mmol/L (ref 22–32)

## 2022-10-09 LAB — COMPREHENSIVE METABOLIC PANEL
ALT: 14 U/L (ref 0–44)
AST: 16 U/L (ref 15–41)
Albumin: 3.8 g/dL (ref 3.5–5.0)
Alkaline Phosphatase: 61 U/L (ref 38–126)
Anion gap: 7 (ref 5–15)
BUN: 8 mg/dL (ref 6–20)
CO2: 25 mmol/L (ref 22–32)
Calcium: 9.1 mg/dL (ref 8.9–10.3)
Chloride: 105 mmol/L (ref 98–111)
Creatinine, Ser: 0.91 mg/dL (ref 0.61–1.24)
GFR, Estimated: >60 mL/min (ref >60)
Glucose, Bld: 97 mg/dL (ref 70–99)
Potassium: 3.6 mmol/L (ref 3.5–5.1)
Sodium: 137 mmol/L (ref 135–145)
Total Bilirubin: 0.9 mg/dL (ref 0.3–1.2)
Total Protein: 6.9 g/dL (ref 6.5–8.1)

## 2022-10-09 LAB — CBC WITH DIFFERENTIAL/PLATELET
Abs Immature Granulocytes: 0.02 10*3/uL (ref 0.00–0.07)
Basophils Absolute: 0 10*3/uL (ref 0.0–0.1)
Basophils Relative: 0 %
Eosinophils Absolute: 0 10*3/uL (ref 0.0–0.5)
Eosinophils Relative: 0 %
HCT: 35.4 % — ABNORMAL LOW (ref 39.0–52.0)
Hemoglobin: 11.4 g/dL — ABNORMAL LOW (ref 13.0–17.0)
Immature Granulocytes: 0 %
Lymphocytes Relative: 35 %
Lymphs Abs: 2.5 10*3/uL (ref 0.7–4.0)
MCH: 32.9 pg (ref 26.0–34.0)
MCHC: 32.2 g/dL (ref 30.0–36.0)
MCV: 102.3 fL — ABNORMAL HIGH (ref 80.0–100.0)
Monocytes Absolute: 0.7 10*3/uL (ref 0.1–1.0)
Monocytes Relative: 9 %
Neutro Abs: 4.1 10*3/uL (ref 1.7–7.7)
Neutrophils Relative %: 56 %
Platelets: 221 10*3/uL (ref 150–400)
RBC: 3.46 MIL/uL — ABNORMAL LOW (ref 4.22–5.81)
RDW: 13.2 % (ref 11.5–15.5)
WBC: 7.3 10*3/uL (ref 4.0–10.5)
nRBC: 0 % (ref 0.0–0.2)

## 2022-10-09 MED ORDER — HYDROCODONE-ACETAMINOPHEN 5-325 MG PO TABS
1.0000 | ORAL_TABLET | Freq: Once | ORAL | Status: AC
  Administered 2022-10-09: 1 via ORAL
  Filled 2022-10-09: qty 1

## 2022-10-09 MED ORDER — TETANUS-DIPHTH-ACELL PERTUSSIS 5-2.5-18.5 LF-MCG/0.5 IM SUSY
0.5000 mL | PREFILLED_SYRINGE | Freq: Once | INTRAMUSCULAR | Status: AC
  Administered 2022-10-09: 0.5 mL via INTRAMUSCULAR
  Filled 2022-10-09: qty 0.5

## 2022-10-09 MED ORDER — METHOCARBAMOL 500 MG PO TABS
500.0000 mg | ORAL_TABLET | Freq: Three times a day (TID) | ORAL | 0 refills | Status: AC | PRN
  Filled 2022-10-09: qty 20, 7d supply, fill #0

## 2022-10-09 MED ORDER — OXYCODONE-ACETAMINOPHEN 5-325 MG PO TABS
1.0000 | ORAL_TABLET | Freq: Once | ORAL | Status: AC
  Administered 2022-10-09: 1 via ORAL
  Filled 2022-10-09: qty 1

## 2022-10-09 MED ORDER — PREDNISONE 50 MG PO TABS
50.0000 mg | ORAL_TABLET | Freq: Every day | ORAL | 0 refills | Status: DC
  Filled 2022-10-09: qty 5, 5d supply, fill #0

## 2022-10-09 MED ORDER — LIDOCAINE-EPINEPHRINE 2 %-1:100000 IJ SOLN
20.0000 mL | Freq: Once | INTRAMUSCULAR | Status: AC
  Administered 2022-10-09: 20 mL
  Filled 2022-10-09: qty 1

## 2022-10-09 MED ORDER — IOHEXOL 300 MG/ML  SOLN
75.0000 mL | Freq: Once | INTRAMUSCULAR | Status: AC | PRN
  Administered 2022-10-09: 75 mL via INTRAVENOUS
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
## 2022-10-09 NOTE — ED Provider Notes (Signed)
MRI reviewed with NP Reinaldo Meeker of neurosurgery.  She recommends lateral C-spine x-ray with flexion-extension x-rays.  If no instability can be discharged without cervical collar.  Dr. Ralene Bathe did discuss with him his large mediastinal mass and will follow-up with oncology for further evaluation.  Flex ex x-ray does not show any cervical instability.  Patient aware of mediastinal mass and need for follow-up.  He is aware this could be something cancerous.  Dr. Ralene Bathe to discuss with oncology.  This was discussed with Dr. Kipp Brood of cardiothoracic surgery as well who agrees to see patient in the office for follow-up. Discussed with patient that this could be something cancerous.  He will be given a course of steroids for suspected radicular symptoms in his left arm.  MRI and flex ex reviewed as above with neurosurgery.  Follow-up with neurosurgery, cardiothoracic surgery as well as oncology.  Return precautions discussed.   Matthew Essex, MD 10/09/22 [phone]

## 2022-10-09 NOTE — ED Notes (Signed)
Aspen collar placed on patient
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
## 2022-10-09 NOTE — ED Notes (Signed)
Patient up to bathroom. Patient noted to not have C-collar on. Patient educated on need for c-collar. C-collar placed back on.

## 2022-10-09 NOTE — Discharge Instructions (Addendum)
You had a CT scan performed in the emergency department today that showed a chest on your mass that has been there since 2019 but slowly getting bigger.  You should receive a call from oncology for a follow-up appointment to get a better idea of what is going on in your chest.  You should also schedule an appointment with a thoracic surgeon to have this potentially biopsied.  This could be something cancerous.  You had sutures placed in the emergency department that will need to be removed in the next 5 to 7 days.  You may return to urgent care or the nearest emergency department for suture removal.

## 2022-10-09 NOTE — ED Notes (Signed)
Patient refusing to keep on c-collar. Educated on risks. Patient verbalized understanding.

## 2022-10-10 ENCOUNTER — Other Ambulatory Visit: Payer: Self-pay

## 2022-10-14 ENCOUNTER — Other Ambulatory Visit: Payer: Self-pay

## 2022-10-14 ENCOUNTER — Emergency Department (HOSPITAL_COMMUNITY)
Admission: EM | Admit: 2022-10-14 | Discharge: 2022-10-14 | Disposition: A | Discharge status: Home / Self Care | Payer: Medicaid Other | Attending: Emergency Medicine | Admitting: Emergency Medicine

## 2022-10-14 ENCOUNTER — Encounter (HOSPITAL_COMMUNITY): Payer: Self-pay

## 2022-10-14 DIAGNOSIS — Z4802 Encounter for removal of sutures: Secondary | ICD-10-CM | POA: Insufficient documentation

## 2022-10-14 NOTE — Discharge Instructions (Signed)
You had your stitches out today.  You may use Neosporin or bacitracin in the area.  Keep it out of the sun to avoid further scarring.  There is information about this type of procedure on these discharge papers.  It was a pleasure to meet you and we hope you feel better!
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
## 2022-10-14 NOTE — ED Triage Notes (Signed)
Pt came in via POV to get stitches removed from his Rt eye brow, denies pain in that area. No other distress noted while in triage.

## 2022-10-14 NOTE — ED Notes (Signed)
Pt is here for stitch removal above right eye. Area appears clean, dry and intact. No drainage noted. Provider at bedside removing stitches at this time. Denies pain or discomfort.

## 2022-11-08 ENCOUNTER — Inpatient Hospital Stay (HOSPITAL_COMMUNITY)
Admission: EM | Admit: 2022-11-08 | Discharge: 2022-11-11 | DRG: 392 | Disposition: A | Discharge status: Home / Self Care | Payer: Medicaid Other | Attending: Internal Medicine | Admitting: Internal Medicine

## 2022-11-08 ENCOUNTER — Emergency Department (HOSPITAL_COMMUNITY): Payer: Medicaid Other

## 2022-11-08 ENCOUNTER — Encounter (HOSPITAL_COMMUNITY): Payer: Self-pay | Admitting: Family Medicine

## 2022-11-08 DIAGNOSIS — Z800 Family history of malignant neoplasm of digestive organs: Secondary | ICD-10-CM

## 2022-11-08 DIAGNOSIS — D72829 Elevated white blood cell count, unspecified: Secondary | ICD-10-CM | POA: Diagnosis present

## 2022-11-08 DIAGNOSIS — R1011 Right upper quadrant pain: Secondary | ICD-10-CM

## 2022-11-08 DIAGNOSIS — K449 Diaphragmatic hernia without obstruction or gangrene: Secondary | ICD-10-CM | POA: Diagnosis present

## 2022-11-08 DIAGNOSIS — R109 Unspecified abdominal pain: Secondary | ICD-10-CM | POA: Diagnosis present

## 2022-11-08 DIAGNOSIS — E872 Acidosis, unspecified: Secondary | ICD-10-CM | POA: Diagnosis present

## 2022-11-08 DIAGNOSIS — Z5982 Transportation insecurity: Secondary | ICD-10-CM

## 2022-11-08 DIAGNOSIS — Z9049 Acquired absence of other specified parts of digestive tract: Secondary | ICD-10-CM

## 2022-11-08 DIAGNOSIS — Z8249 Family history of ischemic heart disease and other diseases of the circulatory system: Secondary | ICD-10-CM

## 2022-11-08 DIAGNOSIS — E876 Hypokalemia: Secondary | ICD-10-CM | POA: Diagnosis present

## 2022-11-08 DIAGNOSIS — R112 Nausea with vomiting, unspecified: Principal | ICD-10-CM | POA: Diagnosis present

## 2022-11-08 DIAGNOSIS — R197 Diarrhea, unspecified: Secondary | ICD-10-CM | POA: Diagnosis present

## 2022-11-08 DIAGNOSIS — N200 Calculus of kidney: Secondary | ICD-10-CM | POA: Diagnosis present

## 2022-11-08 DIAGNOSIS — Z833 Family history of diabetes mellitus: Secondary | ICD-10-CM

## 2022-11-08 DIAGNOSIS — R7989 Other specified abnormal findings of blood chemistry: Secondary | ICD-10-CM | POA: Diagnosis present

## 2022-11-08 DIAGNOSIS — K2990 Gastroduodenitis, unspecified, without bleeding: Secondary | ICD-10-CM | POA: Diagnosis present

## 2022-11-08 DIAGNOSIS — I160 Hypertensive urgency: Secondary | ICD-10-CM | POA: Diagnosis present

## 2022-11-08 DIAGNOSIS — K2970 Gastritis, unspecified, without bleeding: Secondary | ICD-10-CM | POA: Diagnosis present

## 2022-11-08 DIAGNOSIS — F17290 Nicotine dependence, other tobacco product, uncomplicated: Secondary | ICD-10-CM | POA: Diagnosis present

## 2022-11-08 DIAGNOSIS — E860 Dehydration: Secondary | ICD-10-CM | POA: Diagnosis present

## 2022-11-08 DIAGNOSIS — F1290 Cannabis use, unspecified, uncomplicated: Secondary | ICD-10-CM | POA: Diagnosis present

## 2022-11-08 DIAGNOSIS — Z560 Unemployment, unspecified: Secondary | ICD-10-CM

## 2022-11-08 DIAGNOSIS — R1084 Generalized abdominal pain: Secondary | ICD-10-CM

## 2022-11-08 DIAGNOSIS — R030 Elevated blood-pressure reading, without diagnosis of hypertension: Secondary | ICD-10-CM | POA: Diagnosis present

## 2022-11-08 DIAGNOSIS — R222 Localized swelling, mass and lump, trunk: Secondary | ICD-10-CM | POA: Diagnosis present

## 2022-11-08 DIAGNOSIS — G8929 Other chronic pain: Secondary | ICD-10-CM | POA: Diagnosis present

## 2022-11-08 DIAGNOSIS — R1115 Cyclical vomiting syndrome unrelated to migraine: Principal | ICD-10-CM

## 2022-11-08 LAB — URINALYSIS, ROUTINE W REFLEX MICROSCOPIC
Bilirubin Urine: NEGATIVE
Glucose, UA: NEGATIVE mg/dL
Hgb urine dipstick: NEGATIVE
Ketones, ur: 20 mg/dL — AB
Leukocytes,Ua: NEGATIVE
Nitrite: NEGATIVE
Protein, ur: NEGATIVE mg/dL
Specific Gravity, Urine: 1.024 (ref 1.005–1.030)
pH: 7 (ref 5.0–8.0)

## 2022-11-08 LAB — CBC WITH DIFFERENTIAL/PLATELET
Abs Immature Granulocytes: 0.08 10*3/uL — ABNORMAL HIGH (ref 0.00–0.07)
Basophils Absolute: 0 10*3/uL (ref 0.0–0.1)
Basophils Relative: 0 %
Eosinophils Absolute: 0 10*3/uL (ref 0.0–0.5)
Eosinophils Relative: 0 %
HCT: 36.9 % — ABNORMAL LOW (ref 39.0–52.0)
Hemoglobin: 12.2 g/dL — ABNORMAL LOW (ref 13.0–17.0)
Immature Granulocytes: 1 %
Lymphocytes Relative: 12 %
Lymphs Abs: 1.5 10*3/uL (ref 0.7–4.0)
MCH: 32.5 pg (ref 26.0–34.0)
MCHC: 33.1 g/dL (ref 30.0–36.0)
MCV: 98.4 fL (ref 80.0–100.0)
Monocytes Absolute: 0.6 10*3/uL (ref 0.1–1.0)
Monocytes Relative: 5 %
Neutro Abs: 11.1 10*3/uL — ABNORMAL HIGH (ref 1.7–7.7)
Neutrophils Relative %: 82 %
Platelets: 292 10*3/uL (ref 150–400)
RBC: 3.75 MIL/uL — ABNORMAL LOW (ref 4.22–5.81)
RDW: 12.4 % (ref 11.5–15.5)
WBC: 13.3 10*3/uL — ABNORMAL HIGH (ref 4.0–10.5)
nRBC: 0 % (ref 0.0–0.2)

## 2022-11-08 LAB — COMPREHENSIVE METABOLIC PANEL
ALT: 19 U/L (ref 0–44)
AST: 29 U/L (ref 15–41)
Albumin: 4 g/dL (ref 3.5–5.0)
Alkaline Phosphatase: 70 U/L (ref 38–126)
Anion gap: 16 — ABNORMAL HIGH (ref 5–15)
BUN: 8 mg/dL (ref 6–20)
CO2: 20 mmol/L — ABNORMAL LOW (ref 22–32)
Calcium: 9.8 mg/dL (ref 8.9–10.3)
Chloride: 106 mmol/L (ref 98–111)
Creatinine, Ser: 1.09 mg/dL (ref 0.61–1.24)
GFR, Estimated: >60 mL/min (ref >60)
Glucose, Bld: 129 mg/dL — ABNORMAL HIGH (ref 70–99)
Potassium: 3.7 mmol/L (ref 3.5–5.1)
Sodium: 142 mmol/L (ref 135–145)
Total Bilirubin: 1 mg/dL (ref 0.3–1.2)
Total Protein: 7.1 g/dL (ref 6.5–8.1)

## 2022-11-08 LAB — LACTIC ACID, PLASMA
Lactic Acid, Venous: 2.5 mmol/L (ref 0.5–1.9)
Lactic Acid, Venous: 3.1 mmol/L (ref 0.5–1.9)

## 2022-11-08 LAB — LIPASE, BLOOD: Lipase: 22 U/L (ref 11–51)

## 2022-11-08 MED ORDER — LACTATED RINGERS IV BOLUS
1000.0000 mL | Freq: Once | INTRAVENOUS | Status: AC
  Administered 2022-11-08: 1000 mL via INTRAVENOUS

## 2022-11-08 MED ORDER — SODIUM CHLORIDE 0.9% FLUSH
3.0000 mL | Freq: Two times a day (BID) | INTRAVENOUS | Status: DC
  Administered 2022-11-09 – 2022-11-11 (×5): 3 mL via INTRAVENOUS

## 2022-11-08 MED ORDER — HYDROMORPHONE HCL 1 MG/ML IJ SOLN
1.0000 mg | Freq: Once | INTRAMUSCULAR | Status: AC
  Administered 2022-11-08: 1 mg via INTRAVENOUS
  Filled 2022-11-08: qty 1

## 2022-11-08 MED ORDER — LABETALOL HCL 5 MG/ML IV SOLN: 10.0000 mg | INTRAVENOUS | Status: DC | PRN

## 2022-11-08 MED ORDER — HYDROMORPHONE HCL 1 MG/ML IJ SOLN: 1.0000 mg | Freq: Once | INTRAMUSCULAR | Status: DC

## 2022-11-08 MED ORDER — PROCHLORPERAZINE EDISYLATE 10 MG/2ML IJ SOLN
10.0000 mg | Freq: Once | INTRAMUSCULAR | Status: AC
  Administered 2022-11-08: 10 mg via INTRAVENOUS
  Filled 2022-11-08: qty 2

## 2022-11-08 MED ORDER — ZIKS ARTHRITIS PAIN RELIEF 0.025-1-12 % EX CREA
TOPICAL_CREAM | CUTANEOUS | 1 refills | Status: AC
  Filled 2022-11-08: qty 56.6, fill #0

## 2022-11-08 MED ORDER — HYDROMORPHONE HCL 1 MG/ML IJ SOLN
0.5000 mg | INTRAMUSCULAR | Status: DC | PRN
  Administered 2022-11-09 – 2022-11-10 (×4): 0.5 mg via INTRAVENOUS
  Filled 2022-11-08 (×5): qty 0.5

## 2022-11-08 MED ORDER — SODIUM CHLORIDE 0.9 % IV SOLN
12.5000 mg | Freq: Four times a day (QID) | INTRAVENOUS | Status: DC | PRN
  Administered 2022-11-09: 12.5 mg via INTRAVENOUS
  Filled 2022-11-08: qty 0.5

## 2022-11-08 MED ORDER — ACETAMINOPHEN 325 MG PO TABS
650.0000 mg | ORAL_TABLET | Freq: Four times a day (QID) | ORAL | Status: DC | PRN
  Administered 2022-11-10: 650 mg via ORAL
  Filled 2022-11-08: qty 2

## 2022-11-08 MED ORDER — DROPERIDOL 2.5 MG/ML IJ SOLN
2.5000 mg | Freq: Once | INTRAMUSCULAR | Status: AC
  Administered 2022-11-08: 2.5 mg via INTRAVENOUS
  Filled 2022-11-08: qty 2

## 2022-11-08 MED ORDER — IOHEXOL 350 MG/ML SOLN
75.0000 mL | Freq: Once | INTRAVENOUS | Status: AC | PRN
  Administered 2022-11-08: 75 mL via INTRAVENOUS

## 2022-11-08 MED ORDER — PROCHLORPERAZINE EDISYLATE 10 MG/2ML IJ SOLN: 10.0000 mg | Freq: Once | INTRAMUSCULAR | Status: DC

## 2022-11-08 MED ORDER — ONDANSETRON HCL 4 MG/2ML IJ SOLN
4.0000 mg | Freq: Four times a day (QID) | INTRAMUSCULAR | Status: DC | PRN
  Administered 2022-11-09 – 2022-11-10 (×2): 4 mg via INTRAVENOUS
  Filled 2022-11-08 (×2): qty 2

## 2022-11-08 MED ORDER — ONDANSETRON HCL 4 MG PO TABS: 4.0000 mg | ORAL_TABLET | Freq: Four times a day (QID) | ORAL | Status: DC | PRN

## 2022-11-08 MED ORDER — ACETAMINOPHEN 650 MG RE SUPP: 650.0000 mg | Freq: Four times a day (QID) | RECTAL | Status: DC | PRN

## 2022-11-08 MED ORDER — ENOXAPARIN SODIUM 40 MG/0.4ML IJ SOSY
40.0000 mg | PREFILLED_SYRINGE | INTRAMUSCULAR | Status: DC
  Administered 2022-11-09 – 2022-11-11 (×3): 40 mg via SUBCUTANEOUS
  Filled 2022-11-08 (×3): qty 0.4

## 2022-11-08 MED ORDER — SODIUM CHLORIDE 0.9 % IV SOLN: INTRAVENOUS | Status: AC

## 2022-11-08 NOTE — ED Notes (Signed)
Provider notified of critical lactic 2.5

## 2022-11-08 NOTE — Discharge Instructions (Addendum)
I would refrain from smoking marijuana, you need to give it at least 3 to 6 months to see long-term effects if you are having marijuana hyperemesis syndrome.  Drink plenty of fluids, and follow-up only if you have severe worsening of your pain despite treatment.

## 2022-11-08 NOTE — ED Provider Notes (Cosign Needed Addendum)
[facility] Provider Note   CSN: [id number] Arrival date & time: 11/08/22  1453     History  Chief Complaint  Patient presents with   Abdominal Pain    Right side    Matthew Scott is a 42 y.o. male with past medical history significant for chronic abdominal pain, polysubstance abuse, previous cholecystectomy, gastroparesis, cyclic vomiting who presents with concern for severe generalized to right-sided abdominal pain with nausea, vomiting, diarrhea for the last 2 days.  Patient reports some improvement with marijuana, hot shower.  Patient reports multiple episodes of yellow to greenish emesis today.  He is initially quite hypertensive with EMS, blood pressure 220/106 then 168/60.  He has had stable oxygen saturation on room air.  Blood glucose 142 on EMS arrival.  He reports no change in dietary habits.  He reports he has not been able to keep anything down for 2 to 3 days.  He denies any fever, chills, dysuria.   Abdominal Pain      Home Medications Prior to Admission medications   Medication Sig Start Date End Date Taking? Authorizing Provider  alum & mag hydroxide-simeth (MYLANTA MAXIMUM STRENGTH) 400-400-40 MG/5ML suspension Take 15 mLs by mouth every 6 (six) hours as needed for indigestion. 09/02/22   Cristie Hem, MD  benzocaine (ORAJEL) 10 % mucosal gel Use as directed 1 Application in the mouth or throat as needed for mouth pain. 05/17/22   Suzy Bouchard, PA-C  Capsaicin-Menthol-Methyl Sal (CAPSAICIN-METHYL SAL-MENTHOL) 0.025-1-12 % CREA Apply cream to abdominal wall as needed for abdominal pain. Wash your hands and Avoid touching eyes or genital region after application of cream as it can cause irritation. 11/08/22   Faun Mcqueen H, PA-C  methocarbamol (ROBAXIN) 500 MG tablet Take 1 tablet (500 mg total) by mouth every 8 (eight) hours as needed for muscle spasms. 10/09/22   Rancour, Annie Main, MD  naproxen (NAPROSYN)  500 MG tablet Take 1 tablet (500 mg total) by mouth 2 (two) times daily. 05/17/22   Suzy Bouchard, PA-C  ondansetron (ZOFRAN) 4 MG tablet Take 1 tablet (4 mg total) by mouth every 6 (six) hours. 09/01/22   Domenic Moras, PA-C  ondansetron (ZOFRAN-ODT) 4 MG disintegrating tablet Take 1 tablet (4 mg total) by mouth every 8 (eight) hours as needed for nausea or vomiting. 09/02/22   Cristie Hem, MD  pantoprazole (PROTONIX) 20 MG tablet Take 2 tablets (40 mg total) by mouth daily for 14 days. 12/09/20 05/24/21  Gareth Morgan, MD  pantoprazole (PROTONIX) 20 MG tablet Take 20 mg by mouth 2 (two) times daily.    [provider]  predniSONE (DELTASONE) 50 MG tablet Take 1 tablet (50 mg total) by mouth daily. 10/09/22   Rancour, Annie Main, MD  promethazine (PHENERGAN) 25 MG tablet Take 1 tablet (25 mg total) by mouth every 6 (six) hours as needed for nausea or vomiting (IF continuing severe nausea, able to swallow tablet (may use as an alternative to zofran)). 12/09/20   Gareth Morgan, MD  sucralfate (CARAFATE) 1 g tablet Take 1 tablet (1 g total) by mouth with breakfast, with lunch, and with evening meal for 7 days. 09/02/22 09/09/22  Jeanell Sparrow, DO      Allergies    Patient has no known allergies.    Review of Systems   Review of Systems  Gastrointestinal:  Positive for abdominal pain.  All other systems reviewed and are negative.   Physical Exam Updated  Vital Signs BP (!) 155/89   Pulse 83   Temp 98 F (36.7 C) (Oral)   Resp 18   Ht 5\' 9"  (1.753 m)   Wt 82.6 kg   SpO2 97%   BMI 26.89 kg/m  Physical Exam Vitals and nursing note reviewed.  Constitutional:      General: He is not in acute distress.    Appearance: Normal appearance. He is ill-appearing and diaphoretic.  HENT:     Head: Normocephalic and atraumatic.  Eyes:     General:        Right eye: No discharge.        Left eye: No discharge.  Cardiovascular:     Rate and Rhythm: Normal rate and regular  rhythm.     Heart sounds: No murmur heard.    No friction rub. No gallop.  Pulmonary:     Effort: Pulmonary effort is normal.     Breath sounds: Normal breath sounds.  Abdominal:     General: Bowel sounds are normal.     Palpations: Abdomen is soft.     Comments: Patient with tenderness throughout the abdomen, rigidity, guarding, no rebound noted.  No bruising, ecchymosis, or distention.  Skin:    General: Skin is warm.     Capillary Refill: Capillary refill takes less than 2 seconds.  Neurological:     Mental Status: He is alert and oriented to person, place, and time.  Psychiatric:        Mood and Affect: Mood normal.        Behavior: Behavior normal.     ED Results / Procedures / Treatments   Labs (all labs ordered are listed, but only abnormal results are displayed) Labs Reviewed  CBC WITH DIFFERENTIAL/PLATELET - Abnormal; Notable for the following components:      Result Value   WBC 13.3 (*)    RBC 3.75 (*)    Hemoglobin 12.2 (*)    HCT 36.9 (*)    Neutro Abs 11.1 (*)    Abs Immature Granulocytes 0.08 (*)    All other components within normal limits  COMPREHENSIVE METABOLIC PANEL - Abnormal; Notable for the following components:   CO2 20 (*)    Glucose, Bld 129 (*)    Anion gap 16 (*)    All other components within normal limits  URINALYSIS, ROUTINE W REFLEX MICROSCOPIC - Abnormal; Notable for the following components:   Color, Urine STRAW (*)    Ketones, ur 20 (*)    All other components within normal limits  LACTIC ACID, PLASMA - Abnormal; Notable for the following components:   Lactic Acid, Venous 2.5 (*)    All other components within normal limits  LACTIC ACID, PLASMA - Abnormal; Notable for the following components:   Lactic Acid, Venous 3.1 (*)    All other components within normal limits  LIPASE, BLOOD  LACTIC ACID, PLASMA    EKG EKG Interpretation  Date/Time:  Tuesday November 08 2022 15:11:08 EDT Ventricular Rate:  82 PR Interval:  134 QRS  Duration: 102 QT Interval:  378 QTC Calculation: 442 R Axis:   60 Text Interpretation: Sinus rhythm Baseline wander in lead(s) II III aVF V3 V6 Confirmed by Octaviano Glow [phone]) on 11/08/2022 3:24:53 PM  Radiology CT ABDOMEN PELVIS W CONTRAST  Result Date: 11/08/2022 CLINICAL DATA:  Right-sided abdominal pain. Nausea vomiting and diarrhea. EXAM: CT ABDOMEN AND PELVIS WITH CONTRAST TECHNIQUE: Multidetector CT imaging of the abdomen and pelvis was performed using the standard  protocol following bolus administration of intravenous contrast. RADIATION DOSE REDUCTION: This exam was performed according to the departmental dose-optimization program which includes automated exposure control, adjustment of the mA and/or kV according to patient size and/or use of iterative reconstruction technique. CONTRAST:  93mL OMNIPAQUE IOHEXOL 350 MG/ML SOLN COMPARISON:  09/02/2022 FINDINGS: Lower chest: Lung bases are clear.  No pleural or pericardial fluid. Hepatobiliary: Few small liver cysts as seen previously. No acute or significant finding. Previous cholecystectomy. Pancreas: Normal Spleen: Normal Adrenals/Urinary Tract: Adrenal glands are normal. Left kidney is normal. Right kidney is normal except for a nonobstructing 4 mm stone in the upper pole and 3 mm nonobstructing stone in the lower pole. Bladder is normal. Stomach/Bowel: Stomach and small intestine are normal. The appendix is normal. The colon is normal. Vascular/Lymphatic: Aorta and IVC are normal.  No adenopathy. Reproductive: Normal Other: No free fluid or air. Musculoskeletal: Normal IMPRESSION: 1. No acute finding. No abnormality seen to explain the clinical presentation. 2. Previous cholecystectomy. 3. Two nonobstructing stones in the right kidney, 4 mm in the upper pole and 3 mm in the lower pole. Electronically Signed   By: Nelson Chimes M.D.   On: 11/08/2022 16:02    Procedures Procedures    Medications Ordered in ED Medications  HYDROmorphone  (DILAUDID) injection 1 mg (1 mg Intravenous Given 11/08/22 1525)  droperidol (INAPSINE) 2.5 MG/ML injection 2.5 mg (2.5 mg Intravenous Given 11/08/22 1525)  lactated ringers bolus 1,000 mL (0 mLs Intravenous Stopped 11/08/22 1628)  iohexol (OMNIPAQUE) 350 MG/ML injection 75 mL (75 mLs Intravenous Contrast Given 11/08/22 1552)  lactated ringers bolus 1,000 mL (0 mLs Intravenous Stopped 11/08/22 2044)  HYDROmorphone (DILAUDID) injection 1 mg (1 mg Intravenous Given 11/08/22 2212)  prochlorperazine (COMPAZINE) injection 10 mg (10 mg Intravenous Given 11/08/22 2211)    ED Course/ Medical Decision Making/ A&P                             Medical Decision Making Amount and/or Complexity of Data Reviewed Labs: ordered. Radiology: ordered.  Risk OTC drugs. Prescription drug management.   This patient is a 42 y.o. male  who presents to the ED for concern of abdominal pain, nausea, vomiting.   Differential diagnoses prior to evaluation: The emergent differential diagnosis includes, but is not limited to,  The causes of generalized abdominal pain include but are not limited to AAA, mesenteric ischemia, appendicitis, diverticulitis, DKA, gastritis, gastroenteritis, AMI, nephrolithiasis, pancreatitis, peritonitis, adrenal insufficiency,lead poisoning, iron toxicity, intestinal ischemia, constipation, UTI,SBO/LBO, splenic rupture, biliary disease, IBD, IBS, PUD, or hepatitis. This is not an exhaustive differential.   Past Medical History / Co-morbidities: History of EtOH abuse, previous cholecystectomy, chronic abdominal pain, gastroparesis, acid reflux, hypertension, anemia, previous H. pylori infection  Additional history: Chart reviewed. Pertinent results include: Reviewed lab work, imaging from previous emergency department visits, prior CTs  Physical Exam: Physical exam performed. The pertinent findings include: On initial evaluation patient is diaphoretic with some rigidity, guarding of the  abdomen, or repeat evaluation he appears significantly more comfortable, he is somewhat hypertensive, blood pressure 169/86 on last reading.  His heart rate is nontachycardic, he has stable oxygen saturation on room air.  Lab Tests/Imaging studies: I personally interpreted labs/imaging and the pertinent results include: CMP notable for mild bicarb deficit, CO2 20, anion gap 16, he is mildly hyperglycemic, glucose 129.  He has mild leukocytosis, white blood cells 13.3, mild anemia, hemoglobin 12.2.  Lipase is  unremarkable.  He has not provided a urinalysis but he is denying any dysuria or urinary symptoms at this time.  I independently interpreted CT abdomen pelvis which shows some intrarenal nonobstructive stones, and changes of prior cholecystectomy without acute intra-abdominal abnormality.  I agree with the radiologist interpretation.  Cardiac monitoring: EKG obtained and interpreted by my attending physician which shows: NSR, unstable baseline   Medications: I ordered medication including Dilaudid, droperidol, for nausea, significant pain, on reevaluation patient is tolerating p.o., I think that his pain is secondary to chronic cyclic vomiting with some concern for marijuana hyperemesis.  I have reviewed the patients home medicines and have made adjustments as needed.   Patient show lactic acid 2.5, repeat after receiving 2 L of fluid is at 3.1, although he has findings that are somewhat suspicious for ongoing cyclic vomiting, marijuana hyperemesis with lactic increasing I think that he would benefit from hospital admission.  On reexamination his abdominal pain is overall diffuse, unclear etiology at this time, will contact hospitalist.  I spoke with Dr. Myna Hidalgo, and after discussion of relevant lab work, imaging he agrees to admission for this patient due to worsening lactic acidosis, ongoing abdominal pain, nausea, vomiting.  Overall still with clinical suspicion for cyclic vomiting versus marijuana  hyperemesis but differential remains open, atypical presentation of acute mesenteric ischemia not excluded if pain, lactic acidosis continues to worsen.  Final Clinical Impression(s) / ED Diagnoses Final diagnoses:  Cyclic vomiting syndrome  Generalized abdominal pain    Rx / DC Orders ED Discharge Orders          Ordered    Capsaicin-Menthol-Methyl Sal (CAPSAICIN-METHYL SAL-MENTHOL) 0.025-1-12 % CREA        11/08/22 1820              Markelle Najarian, [place] H, PA-C 11/08/22 1931    Dorien Chihuahua 11/08/22 2239    Wyvonnia Dusky, MD 11/09/22 [phone]

## 2022-11-08 NOTE — ED Notes (Addendum)
Pt unable to provide urine sample, he stated "its too cold to pee".

## 2022-11-08 NOTE — H&P (Signed)
History and Physical    Matthew Scott [id number] DOB: [birth year] DOA: 11/08/2022  PCP: Pcp, No   Patient coming from: Home   Chief Complaint: Abdominal pain, N/V   HPI: Matthew Scott is a 42 y.o. male with medical history significant for longstanding history of recurrent abdominal pain with nausea and vomiting, now presenting to the emergency department with abdominal pain, nausea, and vomiting.  Patient reports abdominal pain returned 2 days ago, has been severe at times, usually worse on the right side, and this has been associated with nausea and numerous episodes of nonbloody vomiting.  He has also had some loose stools.  He denies any fevers or chills.  He reports achieving fleeting relief with a hot shower or with smoking marijuana.  Symptoms are the same as what he has experienced many times in the past. He denies alcohol use.    Historically, Matthew Scott has been dealing with these symptoms intermittently since at least 2011 when he had an EGD notable for gastritis with H. pylori.  He has not found relief with antispasmodic or acid suppression.  He was seen by [facility] in 2022 where it was noted that his gastric emptying was only mildly delayed on the study from 2012 and the clinical significance of this was questioned as the patient's symptoms do not seem to fit with gastroparesis.  He was scheduled for EGD in 2022 but ate a sausage biscuit shortly prior to the procedure.  He failed to show up for subsequent appointments and was discharged from the practice.  ED Course: Upon arrival to the ED, patient is found to be afebrile and saturating well on room air with normal heart rate and elevated blood pressure.  EKG demonstrates sinus rhythm with normal QT interval.  CT of the abdomen and pelvis is negative for acute findings.  Labs are most notable for lactic acid 2.5, WBC 13,300, and normal lipase and LFTs.  Patient was given 2 L of LR, 2 doses of Dilaudid, Compazine, and  droperidol in the ED.  Review of Systems:  All other systems reviewed and apart from HPI, are negative.  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 He has never used smokeless tobacco. He reports current drug use. Drug: Marijuana. He reports that he does not drink alcohol.  No Known Allergies  Family History  Problem Relation Age of Onset   Other Father    Diabetes Father    Hypertension Father    Pancreatic cancer Father    Colon cancer Neg Hx    Esophageal cancer Neg Hx    Liver cancer Neg Hx      Prior to Admission medications   Medication Sig Start Date End Date Taking? Authorizing Provider  alum & mag hydroxide-simeth (MYLANTA MAXIMUM STRENGTH) 400-400-40 MG/5ML suspension Take 15 mLs by mouth every 6 (six) hours as  needed for indigestion. 09/02/22   Cristie Hem, MD  benzocaine (ORAJEL) 10 % mucosal gel Use as directed 1 Application in the mouth or throat as needed for mouth pain. 05/17/22   Suzy Bouchard, PA-C  Capsaicin-Menthol-Methyl Sal (CAPSAICIN-METHYL SAL-MENTHOL) 0.025-1-12 % CREA Apply cream to abdominal wall as needed for abdominal pain. Wash your hands and Avoid touching eyes or genital region after application of cream as it can cause irritation. 11/08/22   Prosperi, Christian H, PA-C  methocarbamol (ROBAXIN) 500 MG tablet Take 1 tablet (500 mg total) by mouth every 8 (eight) hours as needed for muscle spasms. 10/09/22    Rancour, Annie Main, MD  naproxen (NAPROSYN) 500 MG tablet Take 1 tablet (500 mg total) by mouth 2 (two) times daily. 05/17/22   Suzy Bouchard, PA-C  ondansetron (ZOFRAN) 4 MG tablet Take 1 tablet (4 mg total) by mouth every 6 (six) hours. 09/01/22   Domenic Moras, PA-C  ondansetron (ZOFRAN-ODT) 4 MG disintegrating tablet Take 1 tablet (4 mg total) by mouth every 8 (eight) hours as needed for nausea or vomiting. 09/02/22   Cristie Hem, MD  pantoprazole (PROTONIX) 20 MG tablet Take 2 tablets (40 mg total) by mouth daily for 14 days. 12/09/20 05/24/21  Gareth Morgan, MD  pantoprazole (PROTONIX) 20 MG tablet Take 20 mg by mouth 2 (two) times daily.    [provider]  predniSONE (DELTASONE) 50 MG tablet Take 1 tablet (50 mg total) by mouth daily. 10/09/22   Rancour, Annie Main, MD  promethazine (PHENERGAN) 25 MG tablet Take 1 tablet (25 mg total) by mouth every 6 (six) hours as needed for nausea or vomiting (IF continuing severe nausea, able to swallow tablet (may use as an alternative to zofran)). 12/09/20   Gareth Morgan, MD  sucralfate (CARAFATE) 1 g tablet Take 1 tablet (1 g total) by mouth with breakfast, with lunch, and with evening meal for 7 days. 09/02/22 09/09/22  Jeanell Sparrow, DO    Physical Exam: Vitals:   11/08/22 1930 11/08/22 2030 11/08/22 2215 11/08/22 2230  BP: (!) 162/88 (!) 170/87 (!) 155/89 126/68  Pulse: 74 77 83 73  Resp: (!) 23 (!) 21 18 16   Temp:  98 F (36.7 C)    TempSrc:  Oral    SpO2: 97% 98% 97% 100%  Weight:      Height:        Constitutional: NAD, no pallor or diaphoresis   Eyes: PERTLA, lids and conjunctivae normal ENMT: Mucous membranes are moist. Posterior pharynx clear of any exudate or lesions.   Neck: supple, no masses  Respiratory: no wheezing, no crackles. No accessory muscle use.  Cardiovascular: S1 & S2 heard, regular rate and rhythm. No extremity edema.   Abdomen: No distension, soft, generally tender without peritoneal signs.  Bowel sounds active.  Musculoskeletal: no clubbing / cyanosis. No joint deformity upper and lower extremities.   Skin: no significant rashes, lesions, ulcers. Warm, dry, well-perfused. Neurologic: CN 2-12 grossly intact. Moving all extremities. Wakes to voice and oriented to person, place, and situation.  Psychiatric: Calm. Cooperative.    Labs and Imaging on Admission: I have personally reviewed following labs and imaging studies  CBC: Recent Labs  Lab 11/08/22 1510  WBC 13.3*  NEUTROABS 11.1*  HGB 12.2*  HCT 36.9*  MCV 98.4  PLT [id number]   Basic Metabolic Panel: Recent Labs  Lab 11/08/22 1510  NA 142  K 3.7  CL 106  CO2 20*  GLUCOSE 129*  BUN  8  CREATININE 1.09  CALCIUM 9.8   GFR: Estimated Creatinine Clearance: 87.4 mL/min (by C-G formula based on SCr of 1.09 mg/dL). Liver Function Tests: Recent Labs  Lab 11/08/22 1510  AST 29  ALT 19  ALKPHOS 70  BILITOT 1.0  PROT 7.1  ALBUMIN 4.0   Recent Labs  Lab 11/08/22 1510  LIPASE 22   No results for input(s): "AMMONIA" in the last 168 hours. Coagulation Profile: No results for input(s): "INR", "PROTIME" in the last 168 hours. Cardiac Enzymes: No results for input(s): "CKTOTAL", "CKMB", "CKMBINDEX", "TROPONINI" in the last 168 hours. BNP (last 3 results) No results for input(s): "PROBNP" in the last 8760 hours. HbA1C: No results for input(s): "HGBA1C" in the last 72 hours. CBG: No results for input(s): "GLUCAP" in the last 168 hours. Lipid Profile: No results for input(s): "CHOL", "HDL", "LDLCALC", "TRIG", "CHOLHDL", "LDLDIRECT" in the last 72 hours. Thyroid Function Tests: No results for input(s): "TSH", "T4TOTAL", "FREET4", "T3FREE", "THYROIDAB" in the last 72 hours. Anemia Panel: No results for input(s): "VITAMINB12", "FOLATE", "FERRITIN", "TIBC", "IRON", "RETICCTPCT" in the last 72 hours. Urine analysis:    Component Value Date/Time   COLORURINE STRAW (A) 11/08/2022 1907   APPEARANCEUR CLEAR 11/08/2022  1907   LABSPEC 1.024 11/08/2022 1907   PHURINE 7.0 11/08/2022 1907   GLUCOSEU NEGATIVE 11/08/2022 1907   HGBUR NEGATIVE 11/08/2022 1907   BILIRUBINUR NEGATIVE 11/08/2022 1907   KETONESUR 20 (A) 11/08/2022 1907   PROTEINUR NEGATIVE 11/08/2022 1907   UROBILINOGEN 1.0 04/27/2015 1206   NITRITE NEGATIVE 11/08/2022 1907   LEUKOCYTESUR NEGATIVE 11/08/2022 1907   Sepsis Labs: @LABRCNTIP (procalcitonin:4,lacticidven:4) )No results found for this or any previous visit (from the past 240 hour(s)).   Radiological Exams on Admission: CT ABDOMEN PELVIS W CONTRAST  Result Date: 11/08/2022 CLINICAL DATA:  Right-sided abdominal pain. Nausea vomiting and diarrhea. EXAM: CT ABDOMEN AND PELVIS WITH CONTRAST TECHNIQUE: Multidetector CT imaging of the abdomen and pelvis was performed using the standard protocol following bolus administration of intravenous contrast. RADIATION DOSE REDUCTION: This exam was performed according to the departmental dose-optimization program which includes automated exposure control, adjustment of the mA and/or kV according to patient size and/or use of iterative reconstruction technique. CONTRAST:  71mL OMNIPAQUE IOHEXOL 350 MG/ML SOLN COMPARISON:  09/02/2022 FINDINGS: Lower chest: Lung bases are clear.  No pleural or pericardial fluid. Hepatobiliary: Few small liver cysts as seen previously. No acute or significant finding. Previous cholecystectomy. Pancreas: Normal Spleen: Normal Adrenals/Urinary Tract: Adrenal glands are normal. Left kidney is normal. Right kidney is normal except for a nonobstructing 4 mm stone in the upper pole and 3 mm nonobstructing stone in the lower pole. Bladder is normal. Stomach/Bowel: Stomach and small intestine are normal. The appendix is normal. The colon is normal. Vascular/Lymphatic: Aorta and IVC are normal.  No adenopathy. Reproductive: Normal Other: No Scott fluid or air. Musculoskeletal: Normal IMPRESSION: 1. No acute finding. No abnormality seen to  explain the clinical presentation. 2. Previous cholecystectomy. 3. Two nonobstructing stones in the right kidney, 4 mm in the upper pole and 3 mm in the lower pole. Electronically Signed   By: Nelson Chimes M.D.   On: 11/08/2022 16:02    EKG: Independently reviewed. Sinus rhythm.   Assessment/Plan   1. Intractable abdominal pain with N/V  - Improved with treatment in ED but still unable to tolerate anything by mouth   - No acute findings on CT in ED; exam benign  - Continue IVF hydration, antiemetics, and analgesics as needed,  re-trial clears once improving and advance as tolerated   2. Hypertensive urgency  - BP 220/106 with EMS   - Improved with pain-control  - Continue pain-control, use antihypertensives as-needed   3. Lactic acidosis  - In setting of N/V and dehydration   - Continue IVF hydration and trend    DVT prophylaxis: Lovenox  Code Status: Full Level of Care: Level of care: Telemetry Medical Family Communication: None present  Disposition Plan:  Patient is from: Home  Anticipated d/c is to: Home  Anticipated d/c date is: 3/27 or 11/10/22  Patient currently: Pending pain-control, tolerance of adequate oral intake  Consults called: none  Admission status: Observation     Vianne Bulls, MD [facility]  11/08/2022, 10:45 PM

## 2022-11-08 NOTE — ED Notes (Signed)
ED TO INPATIENT HANDOFF REPORT  ED Nurse Name and Phone #: Aayan Haskew   S Name/Age/Gender Davis Gourd 42 y.o. male Room/Bed: 021C/021C  Code Status   Code Status: Full Code  Home/SNF/Other Home Patient oriented to: self, place, time, and situation Is this baseline? Yes   Triage Complete: Triage complete  Chief Complaint Intractable nausea and vomiting [R11.2]  Triage Note Pt BIB EMS from home for abdominal pain to right side. Pt reports NVD for the past 2 days and smoked marijuana to ease the pain. PT reports multiple episodes of emesis today. Pt reports pain is relieved with hot shower & marijuana. A&Ox4.  EMS VS 220/106 then 168/60 HR 90 96% RA BG 142   Allergies No Known Allergies  Level of Care/Admitting Diagnosis ED Disposition     ED Disposition  Admit   Condition  --   Comment  Hospital Area: [facility] [[id number]]  Level of Care: Telemetry Medical [104]  May place patient in observation at [facility] or [facility] if equivalent level of care is available:: Yes  Covid Evaluation: Asymptomatic - no recent exposure (last 10 days) testing not required  Diagnosis: Intractable nausea and vomiting [id number]  Admitting Physician: Vianne Bulls [id number]  Attending Physician: Vianne Bulls [id number]          B Medical/Surgery History 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   Qualifier: Diagnosis of  By: Hassell Done FNP, Nykedtra     Chronic abdominal pain    Cyclical vomiting syndrome    GASTRITIS 12/09/2009   Qualifier: Diagnosis of  By: Hassell Done FNP, Nykedtra     Gastroparesis    HELICOBACTER PYLORI INFECTION 12/14/2009   Qualifier: Diagnosis of  By: Isla Pence     Nausea and vomiting    chronic, recurrent   Polysubstance abuse ([place])    Past Surgical History:  Procedure Laterality Date   CHOLECYSTECTOMY  11/2009     A IV Location/Drains/Wounds Patient Lines/Drains/Airways Status     Active  Line/Drains/Airways     Name Placement date Placement time Site Days   Peripheral IV 11/08/22 18 G Anterior;Proximal;Right Forearm 11/08/22  1519  Forearm  less than 1            Intake/Output Last 24 hours  Intake/Output Summary (Last 24 hours) at 11/08/2022 2244 Last data filed at 11/08/2022 1628 Gross per 24 hour  Intake 1000 ml  Output --  Net 1000 ml    Labs/Imaging Results for orders placed or performed during the hospital encounter of 11/08/22 (from the past 48 hour(s))  CBC with Differential     Status: Abnormal   Collection Time: 11/08/22  3:10 PM  Result Value Ref Range   WBC 13.3 (H) 4.0 - 10.5 K/uL   RBC 3.75 (L) 4.22 - 5.81 MIL/uL   Hemoglobin 12.2 (L) 13.0 - 17.0 g/dL   HCT 36.9 (L) 39.0 - 52.0 %   MCV 98.4 80.0 - 100.0 fL   MCH 32.5 26.0 - 34.0 pg   MCHC 33.1 30.0 - 36.0 g/dL   RDW 12.4 11.5 - 15.5 %   Platelets 292 150 - 400 K/uL   nRBC 0.0 0.0 - 0.2 %   Neutrophils Relative % 82 %   Neutro Abs 11.1 (H) 1.7 - 7.7 K/uL   Lymphocytes Relative 12 %   Lymphs Abs 1.5 0.7 - 4.0 K/uL   Monocytes Relative 5 %   Monocytes Absolute 0.6 0.1 - 1.0  K/uL   Eosinophils Relative 0 %   Eosinophils Absolute 0.0 0.0 - 0.5 K/uL   Basophils Relative 0 %   Basophils Absolute 0.0 0.0 - 0.1 K/uL   Immature Granulocytes 1 %   Abs Immature Granulocytes 0.08 (H) 0.00 - 0.07 K/uL    Comment: Performed at [facility] [address], [place], [place] [address]  Comprehensive metabolic panel     Status: Abnormal   Collection Time: 11/08/22  3:10 PM  Result Value Ref Range   Sodium 142 135 - 145 mmol/L   Potassium 3.7 3.5 - 5.1 mmol/L   Chloride 106 98 - 111 mmol/L   CO2 20 (L) 22 - 32 mmol/L   Glucose, Bld 129 (H) 70 - 99 mg/dL    Comment: Glucose reference range applies only to samples taken after fasting for at least 8 hours.   BUN 8 6 - 20 mg/dL   Creatinine, Ser 1.09 0.61 - 1.24 mg/dL   Calcium 9.8 8.9 - 10.3 mg/dL   Total Protein 7.1 6.5 - 8.1 g/dL   Albumin  4.0 3.5 - 5.0 g/dL   AST 29 15 - 41 U/L   ALT 19 0 - 44 U/L   Alkaline Phosphatase 70 38 - 126 U/L   Total Bilirubin 1.0 0.3 - 1.2 mg/dL   GFR, Estimated >60 >60 mL/min    Comment: (NOTE) Calculated using the CKD-EPI Creatinine Equation (2021)    Anion gap 16 (H) 5 - 15    Comment: Performed at [facility] [address], [place], [place] [address]  Lipase, blood     Status: None   Collection Time: 11/08/22  3:10 PM  Result Value Ref Range   Lipase 22 11 - 51 U/L    Comment: Performed at [facility] [address], [place], [place] [address]  Lactic acid, plasma     Status: Abnormal   Collection Time: 11/08/22  5:56 PM  Result Value Ref Range   Lactic Acid, Venous 2.5 (HH) 0.5 - 1.9 mmol/L    Comment: CRITICAL RESULT CALLED TO, READ BACK BY AND VERIFIED WITH Olene Craven RN 11/08/2022 1931 B NUNNERY Performed at [facility], [place] [address] [place], [place] [address]   Urinalysis, Routine w reflex microscopic -Urine, Clean Catch     Status: Abnormal   Collection Time: 11/08/22  7:07 PM  Result Value Ref Range   Color, Urine STRAW (A) YELLOW   APPearance CLEAR CLEAR   Specific Gravity, Urine 1.024 1.005 - 1.030   pH 7.0 5.0 - 8.0   Glucose, UA NEGATIVE NEGATIVE mg/dL   Hgb urine dipstick NEGATIVE NEGATIVE   Bilirubin Urine NEGATIVE NEGATIVE   Ketones, ur 20 (A) NEGATIVE mg/dL   Protein, ur NEGATIVE NEGATIVE mg/dL   Nitrite NEGATIVE NEGATIVE   Leukocytes,Ua NEGATIVE NEGATIVE    Comment: Performed at [facility] [address], [place], [place] [address]  Lactic acid, plasma     Status: Abnormal   Collection Time: 11/08/22  8:35 PM  Result Value Ref Range   Lactic Acid, Venous 3.1 (HH) 0.5 - 1.9 mmol/L    Comment: CRITICAL VALUE NOTED. VALUE IS CONSISTENT WITH PREVIOUSLY REPORTED/CALLED VALUE Performed at [facility], [place] [address], [place],  [address]    CT ABDOMEN PELVIS W CONTRAST  Result Date: 11/08/2022 CLINICAL DATA:   Right-sided abdominal pain. Nausea vomiting and diarrhea. EXAM: CT ABDOMEN AND PELVIS WITH CONTRAST TECHNIQUE: Multidetector  CT imaging of the abdomen and pelvis was performed using the standard protocol following bolus administration of intravenous contrast. RADIATION DOSE REDUCTION: This exam was performed according to the departmental dose-optimization program which includes automated exposure control, adjustment of the mA and/or kV according to patient size and/or use of iterative reconstruction technique. CONTRAST:  28mL OMNIPAQUE IOHEXOL 350 MG/ML SOLN COMPARISON:  09/02/2022 FINDINGS: Lower chest: Lung bases are clear.  No pleural or pericardial fluid. Hepatobiliary: Few small liver cysts as seen previously. No acute or significant finding. Previous cholecystectomy. Pancreas: Normal Spleen: Normal Adrenals/Urinary Tract: Adrenal glands are normal. Left kidney is normal. Right kidney is normal except for a nonobstructing 4 mm stone in the upper pole and 3 mm nonobstructing stone in the lower pole. Bladder is normal. Stomach/Bowel: Stomach and small intestine are normal. The appendix is normal. The colon is normal. Vascular/Lymphatic: Aorta and IVC are normal.  No adenopathy. Reproductive: Normal Other: No free fluid or air. Musculoskeletal: Normal IMPRESSION: 1. No acute finding. No abnormality seen to explain the clinical presentation. 2. Previous cholecystectomy. 3. Two nonobstructing stones in the right kidney, 4 mm in the upper pole and 3 mm in the lower pole. Electronically Signed   By: Nelson Chimes M.D.   On: 11/08/2022 16:02    Pending Labs Unresulted Labs (From admission, onward)     Start     Ordered   11/15/22 0500  Creatinine, serum  (enoxaparin (LOVENOX)    CrCl >/= 30 ml/min)  Weekly,   R     Comments: while on enoxaparin therapy    11/08/22 2244   11/09/22 0500  HIV Antibody (routine testing w rflx)  (HIV Antibody (Routine testing w reflex) panel)  Tomorrow morning,   R        11/08/22  2244   11/09/22 0500  Comprehensive metabolic panel  Tomorrow morning,   R        11/08/22 2244   11/09/22 0500  Magnesium  Tomorrow morning,   R        11/08/22 2244   11/09/22 0500  CBC  Daily,   R      11/08/22 2244   11/08/22 2029  Lactic acid, plasma  Now then every 2 hours,   R      11/08/22 2028            Vitals/Pain Today's Vitals   11/08/22 1930 11/08/22 2030 11/08/22 2215 11/08/22 2230  BP: (!) 162/88 (!) 170/87 (!) 155/89 126/68  Pulse: 74 77 83 73  Resp: (!) 23 (!) 21 18 16   Temp:  98 F (36.7 C)    TempSrc:  Oral    SpO2: 97% 98% 97% 100%  Weight:      Height:      PainSc:        Isolation Precautions No active isolations  Medications Medications  enoxaparin (LOVENOX) injection 40 mg (has no administration in time range)  sodium chloride flush (NS) 0.9 % injection 3 mL (has no administration in time range)  0.9 %  sodium chloride infusion (has no administration in time range)  acetaminophen (TYLENOL) tablet 650 mg (has no administration in time range)    Or  acetaminophen (TYLENOL) suppository 650 mg (has no administration in time range)  HYDROmorphone (DILAUDID) injection 0.5 mg (has no administration in time range)  ondansetron (ZOFRAN) tablet 4 mg (has no administration in time range)    Or  ondansetron (ZOFRAN) injection 4 mg (has no administration in time  range)  promethazine (PHENERGAN) 12.5 mg in sodium chloride 0.9 % 50 mL IVPB (has no administration in time range)  HYDROmorphone (DILAUDID) injection 1 mg (1 mg Intravenous Given 11/08/22 1525)  droperidol (INAPSINE) 2.5 MG/ML injection 2.5 mg (2.5 mg Intravenous Given 11/08/22 1525)  lactated ringers bolus 1,000 mL (0 mLs Intravenous Stopped 11/08/22 1628)  iohexol (OMNIPAQUE) 350 MG/ML injection 75 mL (75 mLs Intravenous Contrast Given 11/08/22 1552)  lactated ringers bolus 1,000 mL (0 mLs Intravenous Stopped 11/08/22 2044)  HYDROmorphone (DILAUDID) injection 1 mg (1 mg Intravenous Given 11/08/22  2212)  prochlorperazine (COMPAZINE) injection 10 mg (10 mg Intravenous Given 11/08/22 2211)    Mobility walks     Focused Assessments     R Recommendations: See Admitting Provider Note  Report given to:   Additional Notes:

## 2022-11-08 NOTE — Progress Notes (Signed)
Patient admitted into the floor, patient refused to answer some of the admission question.

## 2022-11-08 NOTE — ED Triage Notes (Signed)
Pt BIB EMS from home for abdominal pain to right side. Pt reports NVD for the past 2 days and smoked marijuana to ease the pain. PT reports multiple episodes of emesis today. Pt reports pain is relieved with hot shower & marijuana. A&Ox4.  EMS VS 220/106 then 168/60 HR 90 96% RA BG 142

## 2022-11-08 NOTE — ED Notes (Signed)
NAD noted, respirations are equal bilaterally and unlabored at this time. Pt resting comfortably on gurney and denies any unmet needs. Pt connected to CCM, pulseox & BP. Call light within reach.

## 2022-11-08 NOTE — ED Notes (Signed)
PT denies N/V at this time and is sleeping comfortably.

## 2022-11-08 NOTE — ED Notes (Signed)
PT to CT.

## 2022-11-08 NOTE — ED Notes (Signed)
PT give drink.

## 2022-11-09 ENCOUNTER — Other Ambulatory Visit: Payer: Self-pay

## 2022-11-09 DIAGNOSIS — R1084 Generalized abdominal pain: Secondary | ICD-10-CM | POA: Diagnosis present

## 2022-11-09 DIAGNOSIS — R7989 Other specified abnormal findings of blood chemistry: Secondary | ICD-10-CM | POA: Diagnosis present

## 2022-11-09 DIAGNOSIS — F17210 Nicotine dependence, cigarettes, uncomplicated: Secondary | ICD-10-CM | POA: Diagnosis not present

## 2022-11-09 DIAGNOSIS — R109 Unspecified abdominal pain: Secondary | ICD-10-CM | POA: Diagnosis not present

## 2022-11-09 DIAGNOSIS — K259 Gastric ulcer, unspecified as acute or chronic, without hemorrhage or perforation: Secondary | ICD-10-CM | POA: Diagnosis not present

## 2022-11-09 DIAGNOSIS — Z5982 Transportation insecurity: Secondary | ICD-10-CM | POA: Diagnosis not present

## 2022-11-09 DIAGNOSIS — Z8249 Family history of ischemic heart disease and other diseases of the circulatory system: Secondary | ICD-10-CM | POA: Diagnosis not present

## 2022-11-09 DIAGNOSIS — R197 Diarrhea, unspecified: Secondary | ICD-10-CM | POA: Diagnosis present

## 2022-11-09 DIAGNOSIS — G8929 Other chronic pain: Secondary | ICD-10-CM | POA: Diagnosis present

## 2022-11-09 DIAGNOSIS — D72829 Elevated white blood cell count, unspecified: Secondary | ICD-10-CM | POA: Diagnosis present

## 2022-11-09 DIAGNOSIS — Z9049 Acquired absence of other specified parts of digestive tract: Secondary | ICD-10-CM | POA: Diagnosis not present

## 2022-11-09 DIAGNOSIS — F17290 Nicotine dependence, other tobacco product, uncomplicated: Secondary | ICD-10-CM | POA: Diagnosis present

## 2022-11-09 DIAGNOSIS — E876 Hypokalemia: Secondary | ICD-10-CM | POA: Diagnosis present

## 2022-11-09 DIAGNOSIS — F1290 Cannabis use, unspecified, uncomplicated: Secondary | ICD-10-CM | POA: Diagnosis present

## 2022-11-09 DIAGNOSIS — K2970 Gastritis, unspecified, without bleeding: Secondary | ICD-10-CM | POA: Diagnosis present

## 2022-11-09 DIAGNOSIS — Z800 Family history of malignant neoplasm of digestive organs: Secondary | ICD-10-CM | POA: Diagnosis not present

## 2022-11-09 DIAGNOSIS — K2990 Gastroduodenitis, unspecified, without bleeding: Secondary | ICD-10-CM | POA: Diagnosis present

## 2022-11-09 DIAGNOSIS — I160 Hypertensive urgency: Secondary | ICD-10-CM | POA: Diagnosis present

## 2022-11-09 DIAGNOSIS — Z833 Family history of diabetes mellitus: Secondary | ICD-10-CM | POA: Diagnosis not present

## 2022-11-09 DIAGNOSIS — D649 Anemia, unspecified: Secondary | ICD-10-CM | POA: Diagnosis not present

## 2022-11-09 DIAGNOSIS — E872 Acidosis, unspecified: Secondary | ICD-10-CM | POA: Diagnosis present

## 2022-11-09 DIAGNOSIS — R222 Localized swelling, mass and lump, trunk: Secondary | ICD-10-CM | POA: Diagnosis present

## 2022-11-09 DIAGNOSIS — K449 Diaphragmatic hernia without obstruction or gangrene: Secondary | ICD-10-CM | POA: Diagnosis present

## 2022-11-09 DIAGNOSIS — R112 Nausea with vomiting, unspecified: Secondary | ICD-10-CM | POA: Diagnosis present

## 2022-11-09 DIAGNOSIS — R030 Elevated blood-pressure reading, without diagnosis of hypertension: Secondary | ICD-10-CM | POA: Diagnosis present

## 2022-11-09 DIAGNOSIS — N200 Calculus of kidney: Secondary | ICD-10-CM | POA: Diagnosis present

## 2022-11-09 DIAGNOSIS — Z560 Unemployment, unspecified: Secondary | ICD-10-CM | POA: Diagnosis not present

## 2022-11-09 DIAGNOSIS — E860 Dehydration: Secondary | ICD-10-CM | POA: Diagnosis present

## 2022-11-09 LAB — HIV ANTIBODY (ROUTINE TESTING W REFLEX): HIV Screen 4th Generation wRfx: NONREACTIVE

## 2022-11-09 LAB — COMPREHENSIVE METABOLIC PANEL
ALT: 20 U/L (ref 0–44)
AST: 19 U/L (ref 15–41)
Albumin: 3.6 g/dL (ref 3.5–5.0)
Alkaline Phosphatase: 67 U/L (ref 38–126)
Anion gap: 14 (ref 5–15)
BUN: 7 mg/dL (ref 6–20)
CO2: 24 mmol/L (ref 22–32)
Calcium: 9.4 mg/dL (ref 8.9–10.3)
Chloride: 98 mmol/L (ref 98–111)
Creatinine, Ser: 0.79 mg/dL (ref 0.61–1.24)
GFR, Estimated: >60 mL/min (ref >60)
Glucose, Bld: 114 mg/dL — ABNORMAL HIGH (ref 70–99)
Potassium: 3.9 mmol/L (ref 3.5–5.1)
Sodium: 136 mmol/L (ref 135–145)
Total Bilirubin: 1.3 mg/dL — ABNORMAL HIGH (ref 0.3–1.2)
Total Protein: 6.6 g/dL (ref 6.5–8.1)

## 2022-11-09 LAB — CBC
HCT: 34.8 % — ABNORMAL LOW (ref 39.0–52.0)
Hemoglobin: 11.4 g/dL — ABNORMAL LOW (ref 13.0–17.0)
MCH: 32.2 pg (ref 26.0–34.0)
MCHC: 32.8 g/dL (ref 30.0–36.0)
MCV: 98.3 fL (ref 80.0–100.0)
Platelets: 237 10*3/uL (ref 150–400)
RBC: 3.54 MIL/uL — ABNORMAL LOW (ref 4.22–5.81)
RDW: 12.3 % (ref 11.5–15.5)
WBC: 12.1 10*3/uL — ABNORMAL HIGH (ref 4.0–10.5)
nRBC: 0 % (ref 0.0–0.2)

## 2022-11-09 LAB — LACTIC ACID, PLASMA: Lactic Acid, Venous: 1 mmol/L (ref 0.5–1.9)

## 2022-11-09 LAB — MAGNESIUM: Magnesium: 1.8 mg/dL (ref 1.7–2.4)

## 2022-11-09 NOTE — Progress Notes (Signed)
PROGRESS NOTE    JAMIEON DOLMAN  [id number] DOB: [birth year] DOA: 11/08/2022 PCP: Pcp, No     Brief Narrative:   Matthew Scott is a 42 y.o. male with medical history significant for longstanding history of recurrent abdominal pain with nausea and vomiting, now presenting to the emergency department with abdominal pain, nausea, and vomiting.   Subjective:  Feeling better, would like to advance diet to full liquid  Assessment & Plan:  Principal Problem:   Intractable nausea and vomiting Active Problems:   Abdominal pain   Hypertensive urgency   Lactic acidosis    Assessment and Plan:  Intractable aminal pain nausea vomiting -CT abdomen/pel with with contrast no acute findings, -UDS + THC, reports hot showers and capsaicin helped -Supportive care, advance diet as tolerated  Leukocytosis No sign of infection Continue hydration  Hypertension urgency Likely due to stress and pain Resolved   Incidental finding on CT scan: Previous cholecystectomy. 3. Two nonobstructing stones in the right kidney, 4 mm in the upper pole and 3 mm in the lower pole.    I have Reviewed nursing notes, Vitals, pain scores, I/o's, Lab results and  imaging results since pt's last encounter, details please see discussion above  I ordered the following labs:  Unresulted Labs (From admission, onward)     Start     Ordered   11/15/22 0500  Creatinine, serum  (enoxaparin (LOVENOX)    CrCl >/= 30 ml/min)  Weekly,   R     Comments: while on enoxaparin therapy    11/08/22 2244   11/10/22 [id number]  Basic metabolic panel  Tomorrow morning,   R        11/09/22 1841   11/10/22 0500  Magnesium  Tomorrow morning,   R        11/09/22 1841   11/10/22 0500  Phosphorus  Tomorrow morning,   R        11/09/22 1841   11/09/22 0500  CBC  Daily,   R      11/08/22 2244             DVT prophylaxis: enoxaparin (LOVENOX) injection 40 mg Start: 11/09/22 1000   Code Status:   Code Status: Full  Code  Family Communication: Patient Disposition:   Dispo: The patient is from: Home              Anticipated d/c is to: Home              Anticipated d/c date is: Unable to tolerate diet advancement  Antimicrobials:   Anti-infectives (From admission, onward)    None           Objective: Vitals:   11/09/22 0546 11/09/22 0800 11/09/22 1106 11/09/22 1640  BP: (!) 175/92 130/78 138/87 128/78  Pulse: (!) 57 77 67 91  Resp:  18 17 18   Temp: 98.3 F (36.8 C) 98.7 F (37.1 C) 98.4 F (36.9 C) 98.9 F (37.2 C)  TempSrc: Oral Oral Oral Oral  SpO2: 100% 99% 99% 100%  Weight:      Height:        Intake/Output Summary (Last 24 hours) at 11/09/2022 1841 Last data filed at 11/09/2022 1402 Gross per 24 hour  Intake 820 ml  Output 800 ml  Net 20 ml   Filed Weights   11/08/22 1506  Weight: 82.6 kg    Examination:  General exam: alert, awake, communicative,calm, NAD Respiratory system: Clear to auscultation. Respiratory effort normal. Cardiovascular system:  RRR.  Gastrointestinal system: Abdomen is nondistended, soft and nontender.  Normal bowel sounds heard. Central nervous system: Alert and oriented. No focal neurological deficits. Extremities:  no edema Skin: No rashes, lesions or ulcers Psychiatry: Judgement and insight appear normal. Mood & affect appropriate.     Data Reviewed: I have personally reviewed  labs and visualized  imaging studies since the last encounter and formulate the plan        Scheduled Meds:  enoxaparin (LOVENOX) injection  40 mg Subcutaneous Q24H   sodium chloride flush  3 mL Intravenous Q12H   Continuous Infusions:  sodium chloride 125 mL/hr at 11/09/22 1709   promethazine (PHENERGAN) injection (IM or IVPB) 12.5 mg (11/09/22 1057)     LOS: 0 days   Time spent: 53mins  Florencia Reasons, MD PhD FACP [facility]  Available via Epic secure chat 7am-7pm for nonurgent issues Please page for urgent issues To page the attending  provider between 7A-7P or the covering provider during after hours 7P-7A, please log into the web site [email or web address] and access using universal [place] password for that web site. If you do not have the password, please call the hospital operator.    11/09/2022, 6:41 PM

## 2022-11-09 NOTE — Plan of Care (Signed)

## 2022-11-10 ENCOUNTER — Encounter (HOSPITAL_COMMUNITY)
Admission: EM | Disposition: A | Discharge status: Home / Self Care | Payer: Self-pay | Source: Home / Self Care | Attending: Internal Medicine

## 2022-11-10 ENCOUNTER — Inpatient Hospital Stay (HOSPITAL_COMMUNITY): Payer: Medicaid Other

## 2022-11-10 ENCOUNTER — Encounter (HOSPITAL_COMMUNITY): Payer: Self-pay | Admitting: Internal Medicine

## 2022-11-10 ENCOUNTER — Inpatient Hospital Stay (HOSPITAL_COMMUNITY): Payer: Medicaid Other | Admitting: Certified Registered Nurse Anesthetist

## 2022-11-10 DIAGNOSIS — K449 Diaphragmatic hernia without obstruction or gangrene: Secondary | ICD-10-CM

## 2022-11-10 DIAGNOSIS — F17210 Nicotine dependence, cigarettes, uncomplicated: Secondary | ICD-10-CM

## 2022-11-10 DIAGNOSIS — R112 Nausea with vomiting, unspecified: Secondary | ICD-10-CM | POA: Diagnosis not present

## 2022-11-10 DIAGNOSIS — R109 Unspecified abdominal pain: Secondary | ICD-10-CM

## 2022-11-10 DIAGNOSIS — K2970 Gastritis, unspecified, without bleeding: Secondary | ICD-10-CM | POA: Diagnosis not present

## 2022-11-10 DIAGNOSIS — F1290 Cannabis use, unspecified, uncomplicated: Secondary | ICD-10-CM | POA: Diagnosis not present

## 2022-11-10 DIAGNOSIS — D649 Anemia, unspecified: Secondary | ICD-10-CM

## 2022-11-10 DIAGNOSIS — K259 Gastric ulcer, unspecified as acute or chronic, without hemorrhage or perforation: Secondary | ICD-10-CM

## 2022-11-10 HISTORY — PX: BIOPSY: SHX5522

## 2022-11-10 HISTORY — PX: ESOPHAGOGASTRODUODENOSCOPY (EGD) WITH PROPOFOL: SHX5813

## 2022-11-10 LAB — BASIC METABOLIC PANEL
Anion gap: 7 (ref 5–15)
BUN: 10 mg/dL (ref 6–20)
CO2: 27 mmol/L (ref 22–32)
Calcium: 9.2 mg/dL (ref 8.9–10.3)
Chloride: 103 mmol/L (ref 98–111)
Creatinine, Ser: 1.03 mg/dL (ref 0.61–1.24)
GFR, Estimated: >60 mL/min (ref >60)
Glucose, Bld: 100 mg/dL — ABNORMAL HIGH (ref 70–99)
Potassium: 3.6 mmol/L (ref 3.5–5.1)
Sodium: 137 mmol/L (ref 135–145)

## 2022-11-10 LAB — CBC
HCT: 36.2 % — ABNORMAL LOW (ref 39.0–52.0)
Hemoglobin: 11.7 g/dL — ABNORMAL LOW (ref 13.0–17.0)
MCH: 31.9 pg (ref 26.0–34.0)
MCHC: 32.3 g/dL (ref 30.0–36.0)
MCV: 98.6 fL (ref 80.0–100.0)
Platelets: 264 10*3/uL (ref 150–400)
RBC: 3.67 MIL/uL — ABNORMAL LOW (ref 4.22–5.81)
RDW: 12.5 % (ref 11.5–15.5)
WBC: 9.2 10*3/uL (ref 4.0–10.5)
nRBC: 0 % (ref 0.0–0.2)

## 2022-11-10 LAB — MAGNESIUM: Magnesium: 2 mg/dL (ref 1.7–2.4)

## 2022-11-10 LAB — PHOSPHORUS: Phosphorus: 2.1 mg/dL — ABNORMAL LOW (ref 2.5–4.6)

## 2022-11-10 SURGERY — ESOPHAGOGASTRODUODENOSCOPY (EGD) WITH PROPOFOL
Anesthesia: Monitor Anesthesia Care

## 2022-11-10 MED ORDER — PROPOFOL 500 MG/50ML IV EMUL
INTRAVENOUS | Status: DC | PRN
  Administered 2022-11-10: 125 ug/kg/min via INTRAVENOUS

## 2022-11-10 MED ORDER — LIDOCAINE 2% (20 MG/ML) 5 ML SYRINGE
INTRAMUSCULAR | Status: DC | PRN
  Administered 2022-11-10: 80 mg via INTRAVENOUS

## 2022-11-10 MED ORDER — ESMOLOL HCL 100 MG/10ML IV SOLN
INTRAVENOUS | Status: DC | PRN
  Administered 2022-11-10: 30 mg via INTRAVENOUS

## 2022-11-10 MED ORDER — PROPOFOL 10 MG/ML IV BOLUS
INTRAVENOUS | Status: DC | PRN
  Administered 2022-11-10 (×3): 20 mg via INTRAVENOUS
  Administered 2022-11-10: 30 mg via INTRAVENOUS

## 2022-11-10 MED ORDER — LACTATED RINGERS IV SOLN: INTRAVENOUS | Status: DC | PRN

## 2022-11-10 MED ORDER — CAPSAICIN 0.025 % EX CREA
TOPICAL_CREAM | Freq: Two times a day (BID) | CUTANEOUS | Status: DC
  Filled 2022-11-10: qty 60

## 2022-11-10 MED ORDER — LACTATED RINGERS IV SOLN: INTRAVENOUS | Status: AC

## 2022-11-10 MED ORDER — PANTOPRAZOLE SODIUM 40 MG IV SOLR
40.0000 mg | Freq: Two times a day (BID) | INTRAVENOUS | Status: DC
  Administered 2022-11-10 – 2022-11-11 (×2): 40 mg via INTRAVENOUS
  Filled 2022-11-10 (×3): qty 10

## 2022-11-10 MED ORDER — OXYCODONE-ACETAMINOPHEN 5-325 MG PO TABS: 1.0000 | ORAL_TABLET | Freq: Four times a day (QID) | ORAL | Status: DC | PRN

## 2022-11-10 MED ORDER — K PHOS MONO-SOD PHOS DI & MONO 155-852-130 MG PO TABS
250.0000 mg | ORAL_TABLET | Freq: Once | ORAL | Status: AC
  Administered 2022-11-10: 250 mg via ORAL
  Filled 2022-11-10: qty 1

## 2022-11-10 MED ORDER — HYDROMORPHONE HCL 1 MG/ML IJ SOLN
0.5000 mg | Freq: Once | INTRAMUSCULAR | Status: AC
  Administered 2022-11-10: 0.5 mg via INTRAVENOUS
  Filled 2022-11-10: qty 0.5

## 2022-11-10 SURGICAL SUPPLY — 15 items

## 2022-11-10 NOTE — Anesthesia Postprocedure Evaluation (Signed)
Anesthesia Post Note  Patient: Matthew Scott  Procedure(s) Performed: ESOPHAGOGASTRODUODENOSCOPY (EGD) WITH PROPOFOL BIOPSY     Patient location during evaluation: Phase II Anesthesia Type: MAC Level of consciousness: awake Pain management: pain level controlled Vital Signs Assessment: post-procedure vital signs reviewed and stable Respiratory status: spontaneous breathing Cardiovascular status: stable Postop Assessment: no apparent nausea or vomiting Anesthetic complications: no  No notable events documented.  Last Vitals:  Vitals:   11/10/22 1359 11/10/22 1407  BP: (!) 134/91 (!) 141/116  Pulse: 94 98  Resp: 13 16  Temp:    SpO2: 98% 97%    Last Pain:  Vitals:   11/10/22 1407  TempSrc:   PainSc: 10-Worst pain ever                 Huston Foley

## 2022-11-10 NOTE — Progress Notes (Addendum)
PROGRESS NOTE    Matthew Scott  [id number] DOB: 08-01-1981 DOA: 11/08/2022 PCP: Pcp, No     Brief Narrative:   Matthew Scott is a 42 y.o. male with medical history significant for longstanding history of recurrent abdominal pain with nausea and vomiting, now presenting to the emergency department with abdominal pain, nausea, and vomiting.   Subjective:  C/o diffuse abdominal pain, reprots feeling hot, laying on the floor, reports has morning ab pain most of the morning but never this bad, no vomiting, has not have bm for several days  Awaiting updated vital signs,  Am labs works has improved, except low phos  Assessment & Plan:  Principal Problem:   Intractable nausea and vomiting Active Problems:   Abdominal pain   Hypertensive urgency   Lactic acidosis    Assessment and Plan:  Intractable aminal pain nausea vomiting -CT abdomen/pel with with contrast no acute findings, -UDS + THC, reports hot showers and capsaicin helped -Supportive care, advance diet as tolerated  Ab pain, reports morning ab pain daily but never this bad Will start ppi, get kug, consult gi to consider EGD  Leukocytosis No sign of infection Wbc normalized  Hypophosphatemia Replace phos  Hypertension urgency Likely due to stress and pain Resolved   Incidental finding on CT scan: Previous cholecystectomy. 3. Two nonobstructing stones in the right kidney, 4 mm in the upper pole and 3 mm in the lower pole.  Findings on CT scan from 10/09/2022 showed large mediastinal mass, will discuss with thoracic surgery    I have Reviewed nursing notes, Vitals, pain scores, I/o's, Lab results and  imaging results since pt's last encounter, details please see discussion above  I ordered the following labs:  Unresulted Labs (From admission, onward)     Start     Ordered   11/15/22 0500  Creatinine, serum  (enoxaparin (LOVENOX)    CrCl >/= 30 ml/min)  Weekly,   R     Comments: while on enoxaparin  therapy    11/08/22 2244   11/11/22 [id number]  Basic metabolic panel  Tomorrow morning,   R        11/10/22 0757   11/11/22 0500  Phosphorus  Tomorrow morning,   R        11/10/22 0757   11/11/22 0500  Magnesium  Tomorrow morning,   R        11/10/22 0757   11/09/22 0500  CBC  Daily,   R      11/08/22 2244             DVT prophylaxis: enoxaparin (LOVENOX) injection 40 mg Start: 11/09/22 1000   Code Status:   Code Status: Full Code  Family Communication: Patient Disposition:   Dispo: The patient is from: Home              Anticipated d/c is to: Home              Anticipated d/c date is: TBD  Antimicrobials:   Anti-infectives (From admission, onward)    None           Objective: Vitals:   11/09/22 1106 11/09/22 1640 11/09/22 2037 11/10/22 0352  BP: 138/87 128/78 136/73 120/73  Pulse: 67 91 70 (!) 57  Resp: 17 18 18    Temp: 98.4 F (36.9 C) 98.9 F (37.2 C) 100 F (37.8 C) 98.5 F (36.9 C)  TempSrc: Oral Oral Oral Oral  SpO2: 99% 100% 100% 98%  Weight:  Height:        Intake/Output Summary (Last 24 hours) at 11/10/2022 0757 Last data filed at 11/09/2022 1402 Gross per 24 hour  Intake 720 ml  Output 600 ml  Net 120 ml   Filed Weights   11/08/22 1506  Weight: 82.6 kg    Examination:  General exam: in pain,  Respiratory system: Clear to auscultation. Respiratory effort normal. Cardiovascular system:  RRR.  Gastrointestinal system: Abdomen is rigid, tender, no rebound,   Normal bowel sounds heard. Central nervous system: Alert and oriented. No focal neurological deficits. Extremities:  no edema Skin: No rashes, lesions or ulcers Psychiatry: in pain     Data Reviewed: I have personally reviewed  labs and visualized  imaging studies since the last encounter and formulate the plan        Scheduled Meds:  capsaicin   Topical BID   enoxaparin (LOVENOX) injection  40 mg Subcutaneous Q24H   pantoprazole (PROTONIX) IV  40 mg Intravenous  Q12H   phosphorus  250 mg Oral Once   sodium chloride flush  3 mL Intravenous Q12H   Continuous Infusions:  lactated ringers     promethazine (PHENERGAN) injection (IM or IVPB) 12.5 mg (11/09/22 1057)     LOS: 1 day   Time spent: 5mins  Florencia Reasons, MD PhD FACP [facility]  Available via Epic secure chat 7am-7pm for nonurgent issues Please page for urgent issues To page the attending provider between 7A-7P or the covering provider during after hours 7P-7A, please log into the web site [email or web address] and access using universal [place] password for that web site. If you do not have the password, please call the hospital operator.    11/10/2022, 7:57 AM

## 2022-11-10 NOTE — Anesthesia Procedure Notes (Signed)
Procedure Name: MAC Date/Time: 11/10/2022 1:27 PM  Performed by: Carolan Clines, CRNAPre-anesthesia Checklist: Patient identified, Emergency Drugs available, Suction available and Patient being monitored Patient Re-evaluated:Patient Re-evaluated prior to induction Oxygen Delivery Method: Nasal cannula Dental Injury: Teeth and Oropharynx as per pre-operative assessment

## 2022-11-10 NOTE — Consult Note (Signed)
Consultation  Referring Provider: No ref. provider found Primary Care Physician:  Pcp, No Primary Gastroenterologist:  none- discharged from [facility]  Reason for Consultation: Recurrent episodes of upper abdominal pain nausea and vomiting  HPI: Matthew Scott is a 42 y.o. male, previously had been established with Dr. Hinton Dyer seen in 2022 and since discharged from [facility]. We are asked to see him currently for recurrent abdominal pain nausea and vomiting.  This has been a longstanding issue for this patient who says that he has been having these episodes since he had his gallbladder out 17 years ago.  He has had numerous emergency room visits and 5 ER visits this year for similar symptoms.  He has had 3 abdominal CTs this year.  He describes having mid abdominal pain every morning when he wakes up, sometimes he is able to exercise or take a hot shower and the symptoms will resolve and then he is okay for the rest of the day usually able to eat without difficulty, no ongoing heartburn indigestion etc.  Weight overall stable. He says that once or twice a week he will have an episode that will not go away, becomes intense and then he starts having nausea and vomiting and inability to keep anything down.  He says he cries sometimes with the pain and his father with whom he lives usually will call an ambulance. This was one of those episodes.  Does say that earlier in the week he was also having watery diarrhea with several watery diarrheal stools per day.  He says ever since his gallbladder came out he has urgency for bowel movement after eating.  He did not notice any blood with the diarrhea, no fever chills etc. and the diarrhea has stopped since admission. He occasionally uses Tylenol, denies NSAIDs, denies EtOH he does use cannabis on a daily basis and has done so for a long time.  He says that his issue with stopping cannabis use is that it significantly calms him down and he is not sure how we  will handle himself if he has to come off of this.  Feeling better since admission symptoms have largely improved. On 11/08/2022 lipase was 22 BUN 8/creatinine 1.09 LFTs within normal limits WBC 13.3/hemoglobin 12.2/hematocrit 36.9 Interestingly lactate was elevated at 2.5 on admission  Today WBC 9.2/hemoglobin 11.7/hematocrit 36.2 Potassium 3.6 creatinine 1.0 LFTs within normal limits  CT of the abdomen and pelvis on admission shows him to be status postcholecystectomy there are 2 tiny stones up in the right kidney, no acute abnormalities  He did have a chest CT done February 2024 which showed interval enlargement of a heterogeneously enhancing anterior mediastinal mass during 10.5 x 5.4 x 5.6 cm -enlarged since 2019 unclear whether this represents a benign versus low-grade malignant process question teratoma question thymus question lymphoma.  Did have 1 prior EGD done in 2011 and this showed gastritis.   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 Authorizing Provider  alum & mag hydroxide-simeth (MYLANTA MAXIMUM STRENGTH) 400-400-40 MG/5ML suspension Take 15 mLs by mouth every 6 (six) hours as needed for indigestion. 09/02/22  Yes Cristie Hem, MD  methocarbamol (ROBAXIN) 500 MG tablet Take 1 tablet (500 mg total) by mouth every 8 (eight) hours as needed for muscle spasms. 10/09/22  Yes Rancour, Annie Main, MD  ondansetron  (ZOFRAN-ODT) 4 MG disintegrating tablet Take 1 tablet (4 mg total) by mouth every 8 (eight) hours as needed for nausea or vomiting. 09/02/22  Yes Cristie Hem, MD  sucralfate (CARAFATE) 1 g tablet Take 1 tablet (1 g total) by mouth with breakfast, with lunch, and with evening meal for 7 days. Patient taking differently: Take 1 g by mouth 3 (three) times daily as needed (stomach pain, nausea). 09/02/22 12/24/22 Yes Jeanell Sparrow, DO  benzocaine (ORAJEL) 10 % mucosal gel Use as directed 1 Application in the mouth or throat as needed for mouth pain. Patient not taking: Reported on 11/10/2022 05/17/22   Suzy Bouchard, PA-C  Capsaicin-Menthol-Methyl Sal (CAPSAICIN-METHYL SAL-MENTHOL) 0.025-1-12 % CREA Apply cream to abdominal wall as needed for abdominal pain. Wash your hands and Avoid touching eyes or genital region after application of cream as it can cause irritation. Patient not taking: Reported on 11/10/2022 11/08/22   Prosperi, Christian H, PA-C  naproxen (NAPROSYN) 500 MG tablet Take 1 tablet (500 mg total) by mouth 2 (two) times daily. Patient not taking: Reported on 11/10/2022 05/17/22   Suzy Bouchard, PA-C  ondansetron (ZOFRAN) 4 MG tablet Take 1 tablet (4 mg total) by mouth every 6 (six) hours. Patient not taking: Reported on 11/10/2022 09/01/22   Domenic Moras, PA-C  pantoprazole (PROTONIX) 20 MG tablet Take 2 tablets (40 mg total) by mouth daily for 14 days. Patient not taking: Reported on 11/10/2022 12/09/20 05/24/21  Gareth Morgan, MD  predniSONE (DELTASONE) 50 MG tablet Take 1 tablet (50 mg total) by mouth daily. Patient not taking: Reported on 11/10/2022 10/09/22   Ezequiel Essex, MD    Current Facility-Administered Medications  Medication Dose Route Frequency Provider Last Rate Last Admin   acetaminophen (TYLENOL) tablet 650 mg  650 mg Oral Q6H PRN Opyd, Ilene Qua, MD   650 mg at 11/10/22 [id number]   Or   acetaminophen (TYLENOL) suppository 650 mg  650 mg Rectal Q6H PRN Opyd,  Ilene Qua, MD       capsaicin (ZOSTRIX) 0.025 % cream   Topical BID Florencia Reasons, MD   Given at 11/10/22 0941   enoxaparin (LOVENOX) injection 40 mg  40 mg Subcutaneous Q24H Opyd, Ilene Qua, MD   40 mg at 11/10/22 [id number]   HYDROmorphone (DILAUDID) injection 0.5 mg  0.5 mg Intravenous Q3H PRN Vianne Bulls, MD   0.5 mg at 11/10/22 0641   labetalol (NORMODYNE) injection 10 mg  10 mg Intravenous Q2H PRN Opyd, Ilene Qua, MD       lactated ringers infusion   Intravenous Continuous Florencia Reasons, MD 75 mL/hr at 11/10/22 0823 New Bag at 11/10/22 0823   ondansetron (ZOFRAN) tablet 4 mg  4 mg Oral Q6H PRN Opyd, Ilene Qua, MD       Or   ondansetron (ZOFRAN) injection 4 mg  4 mg Intravenous Q6H PRN Opyd, Ilene Qua, MD   4 mg at 11/10/22 0641   pantoprazole (PROTONIX) injection 40 mg  40 mg Intravenous  Q12H Florencia Reasons, MD   40 mg at 11/10/22 0820   promethazine (PHENERGAN) 12.5 mg in sodium chloride 0.9 % 50 mL IVPB  12.5 mg Intravenous Q6H PRN Opyd, Ilene Qua, MD 200 mL/hr at 11/09/22 1057 12.5 mg at 11/09/22 1057   sodium chloride flush (NS) 0.9 % injection 3 mL  3 mL Intravenous Q12H Opyd, Ilene Qua, MD   3 mL at 11/10/22 0826    Allergies as of 11/08/2022   (No Known Allergies)    Family History  Problem Relation Age of Onset   Other Father    Diabetes Father    Hypertension Father    Pancreatic cancer Father    Colon cancer Neg Hx    Esophageal cancer Neg Hx    Liver cancer Neg Hx     Social History   Socioeconomic History   Marital status: Single    Spouse name: Not on file   Number of children: Not on file   Years of education: Not on file   Highest education level: Not on file  Occupational History   Occupation: Unemployed, helps people move  Tobacco Use   Smoking status: Some Days    Types: Cigars    Last attempt to quit: 12/15/2014    Years since quitting: 7.9   Smokeless tobacco: Never   Tobacco comments:    7 cigars infused with marijuana a day  Vaping Use   Vaping Use: Never used   Substance and Sexual Activity   Alcohol use: No    Alcohol/week: 0.0 standard drinks of alcohol   Drug use: Yes    Types: Marijuana    Comment: 6 hrs PTA   Sexual activity: Yes  Other Topics Concern   Not on file  Social History Narrative   Single, lives with parents.  Ambulatory.   Social Determinants of Health   Financial Resource Strain: Not on file  Food Insecurity: No Food Insecurity (11/08/2022)   Hunger Vital Sign    Worried About Running Out of Food in the Last Year: Never true    Ran Out of Food in the Last Year: Never true  Transportation Needs: Unmet Transportation Needs (04/23/2020)   PRAPARE - Hydrologist (Medical): Yes    Lack of Transportation (Non-Medical): Yes  Physical Activity: Not on file  Stress: Not on file  Social Connections: Not on file  Intimate Partner Violence: Not At Risk (11/08/2022)   Humiliation, Afraid, Rape, and Kick questionnaire    Fear of Current or Ex-Partner: No    Emotionally Abused: No    Physically Abused: No    Sexually Abused: No    Review of Systems: Pertinent positive and negative review of systems were noted in the above HPI section.  All other review of systems was otherwise negative.   Physical Exam: Vital signs in last 24 hours: Temp:  [98.3 F (36.8 C)-100 F (37.8 C)] 98.3 F (36.8 C) (03/28 0815) Pulse Rate:  [54-91] 54 (03/28 0815) Resp:  [17-18] 18 (03/28 0815) BP: (120-145)/(73-87) 145/73 (03/28 0815) SpO2:  [98 %-100 %] 98 % (03/28 0815) Last BM Date : 11/08/22 General:   Alert,  Well-developed, well-nourished, AA male  pleasant and cooperative in NAD Head:  Normocephalic and atraumatic. Eyes:  Sclera clear, no icterus.   Conjunctiva pink. Ears:  Normal auditory acuity. Nose:  No deformity, discharge,  or lesions. Mouth:  No deformity or lesions.   Neck:  Supple; no masses or thyromegaly. Lungs:  Clear throughout to auscultation.   No wheezes, crackles, or rhonchi . Heart:   Regular rate and rhythm; no murmurs, clicks, rubs,  or gallops. Abdomen:  Soft,nontender, BS active,nonpalp mass or hsm.   Rectal:  Deferred  Msk:  Symmetrical without gross deformities. . Pulses:  Normal pulses noted. Extremities:  Without clubbing or edema. Neurologic:  Alert and  oriented x4;  grossly normal neurologically. Skin:  Intact without significant lesions or rashes.. Psych:  Alert and cooperative. Normal mood and affect.  Intake/Output from previous day: 03/27 0701 - 03/28 0700 In: 720 [P.O.:720] Out: 600 [Urine:600] Intake/Output this shift: Total I/O In: -  Out: 600 [Urine:600]  Lab Results: Recent Labs    11/08/22 1510 11/09/22 0107 11/10/22 0253  WBC 13.3* 12.1* 9.2  HGB 12.2* 11.4* 11.7*  HCT 36.9* 34.8* 36.2*  PLT 292 237 264   BMET Recent Labs    11/08/22 1510 11/09/22 0107 11/10/22 0253  NA 142 136 137  K 3.7 3.9 3.6  CL 106 98 103  CO2 20* 24 27  GLUCOSE 129* 114* 100*  BUN 8 7 10   CREATININE 1.09 0.79 1.03  CALCIUM 9.8 9.4 9.2   LFT Recent Labs    11/09/22 0107  PROT 6.6  ALBUMIN 3.6  AST 19  ALT 20  ALKPHOS 67  BILITOT 1.3*   PT/INR No results for input(s): "LABPROT", "INR" in the last 72 hours. Hepatitis Panel No results for input(s): "HEPBSAG", "HCVAB", "HEPAIGM", "HEPBIGM" in the last 72 hours.     IMPRESSION:   #54 42 year old African-American male with recurring episodes of mid and upper abdominal pain occurring once or twice weekly then onset of nausea and vomiting which will become intractable.  He has had numerous emergency room visits over the past years for similar symptoms and has had 5 ER visits this year and 3 CT scans. He says he has mid abdominal pain every morning when he wakes up, sometimes able to relieve this with hot showers, lying on the floor, or exercise  He is status post cholecystectomy, current symptoms are in the setting of current daily cannabis use and are very consistent with symptoms seen with  cannabis hyperemesis syndrome  Rule out superimposed chronic gastritis, bile gastritis peptic ulcer disease etc.  #2 enlarging mediastinal mass noted on chest CT February 2024 patient has not had any further workup since  #3 kidney stones-2 tiny stones in the right kidney  PLAN: Keep n.p.o. Will be scheduled for EGD with Dr. Havery Moros this afternoon.  Procedure was discussed in detail with the patient including indications risk and benefits and he is agreeable to proceed. He should stay on a daily PPI He will benefit from having a prescription for Zofran or ODT Zofran 4 mg to use every 6 hours as needed once episodes of nausea and vomiting start. Had a frank discussion with him about cannabis hyperemesis syndrome he is aware of this but is just not sure that he will be able to stop using cannabis.  He understands that in order to get rid of his recurring symptoms and avoid recurrent emergency room visits and for his overall long-term health he needs to stop using cannabis.  And also needs to be set up with a primary care MD-perhaps [facility] that he can get prescriptions rather than having recurrent ER visits  Please ask surgery to see him while he is here regarding the mediastinal mass   Schuyler Behan PA-C 11/10/2022, 10:31 AM

## 2022-11-10 NOTE — Anesthesia Preprocedure Evaluation (Signed)
Anesthesia Evaluation  Patient identified by MRN, date of birth, ID band Patient awake    Reviewed: Allergy & Precautions, NPO status , Patient's Chart, lab work & pertinent test results  Airway Mallampati: I       Dental no notable dental hx.    Pulmonary Current Smoker and Patient abstained from smoking.   Pulmonary exam normal        Cardiovascular Normal cardiovascular exam     Neuro/Psych negative neurological ROS  negative psych ROS   GI/Hepatic Neg liver ROS,,,  Endo/Other    Renal/GU negative Renal ROS  negative genitourinary   Musculoskeletal   Abdominal Normal abdominal exam  (+)   Peds  Hematology  (+) Blood dyscrasia, anemia   Anesthesia Other Findings   Reproductive/Obstetrics                             Anesthesia Physical Anesthesia Plan  ASA: 2  Anesthesia Plan: MAC   Post-op Pain Management: Minimal or no pain anticipated   Induction:   PONV Risk Score and Plan:   Airway Management Planned: Natural Airway and Simple Face Mask  Additional Equipment: None  Intra-op Plan:   Post-operative Plan:   Informed Consent: I have reviewed the patients History and Physical, chart, labs and discussed the procedure including the risks, benefits and alternatives for the proposed anesthesia with the patient or authorized representative who has indicated his/her understanding and acceptance.     Dental advisory given  Plan Discussed with: CRNA  Anesthesia Plan Comments:        Anesthesia Quick Evaluation

## 2022-11-10 NOTE — Op Note (Signed)
[facility] Patient Name: Matthew Scott Procedure Date : 11/10/2022 MRN: [id number] Attending MD: Carlota Raspberry. 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 Numerous imaging                            studies negative for clear cause. EGD to ensure no                            other pathology. Providers:                Carlota Raspberry. Havery Moros, MD, Jaci Carrel, RN,                            Darliss Cheney, Technician Referring MD:              Medicines:                Monitored Anesthesia Care Complications:            No immediate complications. Estimated blood loss:                            Minimal. Estimated Blood Loss:     Estimated blood loss was minimal. Procedure:                Pre-Anesthesia Assessment:                           - Prior to the procedure, a History and Physical                            was performed, and patient medications and                            allergies were reviewed. The patient's tolerance of                            previous anesthesia was also reviewed. The risks                            and benefits of the procedure and the sedation                            options and risks were discussed with the patient.                            All questions were answered, and informed consent  was obtained. Prior Anticoagulants: The patient has                            taken no anticoagulant or antiplatelet agents. ASA                            Grade Assessment: II - A patient with mild systemic                            disease. After reviewing the risks and benefits,                            the patient was deemed in satisfactory condition to                             undergo the procedure.                           After obtaining informed consent, the endoscope was                            passed under direct vision. Throughout the                            procedure, the patient's blood pressure, pulse, and                            oxygen saturations were monitored continuously. The                            [id number]) Olympus endoscope was introduced                            through the mouth, and advanced to the second part                            of duodenum. The upper GI endoscopy was                            accomplished without difficulty. The patient                            tolerated the procedure well. Scope In: Scope Out: Findings:      The Z-line was regular.      A 1 cm hiatal hernia was present.      The exam of the esophagus was otherwise normal.      Patchy mild inflammation characterized by erosions and erythema was       found in the gastric fundus and in the gastric body.      The exam of the stomach was otherwise normal.      Biopsies were taken with a cold forceps in the gastric body, at the       incisura and in the gastric antrum for Helicobacter pylori testing.      The examined duodenum  was normal. Biopsies for histology were taken with       a cold forceps for evaluation of celiac disease. Impression:               - Z-line regular.                           - 1 cm hiatal hernia.                           - Gastritis. Mild.                           - Normal stomach otherwise - biopsies taken to rule                            out H pylori                           - Normal examined duodenum. Biopsied.                           No cause for symptoms on EGD. 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 Counseled him on this extensively. Recommendation:           - Return patient to hospital ward for  ongoing care.                           - Advance diet as tolerated.                           - Continue present medications.                           - Scheduled antiemetics                           - Continue protonix                           - Continue scheduled capsaicin ointment                           - Await pathology results.                           - I expect him to get better with more time off                            marijuana. He needs complete abstinence of                            marijuana  for several weeks. Consider trial of                            benzos while inpatient to help with nausea if needed                           - GI service signing off at this time, call will                            questions otherwise Procedure Code(s):        --- Professional ---                           [phone], Esophagogastroduodenoscopy, flexible,                            transoral; with biopsy, single or multiple Diagnosis Code(s):        --- Professional ---                           K44.9, Diaphragmatic hernia without obstruction or                            gangrene                           K29.70, Gastritis, unspecified, without bleeding                           R11.2, Nausea with vomiting, unspecified CPT copyright 2022 American Medical Association. All rights reserved. The codes documented in this report are preliminary and upon coder review may  be revised to meet current compliance requirements. Remo Lipps P. Kelsha Older, MD 11/10/2022 1:49:04 PM This report has been signed electronically. Number of Addenda: 0

## 2022-11-10 NOTE — Progress Notes (Signed)
   11/10/22 1140  AMBULATORY ONLY: Non-Emergency Transportation Screening  1.  Is the Patient in the clinic and in need of immediate transportation? No  SDOH Interventions  Transportation Interventions Inpatient TOC;Other (Comment) (Appropriate resources provided.)   Pt confirmed need for transportation assistance. Pt has Medicaid, CSW provided resources and information to utilize Medicaid transport. Pt confirms need for PCP, with no preference. CM consulted and will arrange. Pt notes no other concerns.  CSW asked pt about his substance use. Pt states he only smokes marijuana and does not feel it is a problem. CSW and pt discussed safe use and the risk of other drugs being present without his knowledge. Pt noted understanding.

## 2022-11-10 NOTE — Transfer of Care (Addendum)
Immediate Anesthesia Transfer of Care Note  Patient: Matthew Scott  Procedure(s) Performed: ESOPHAGOGASTRODUODENOSCOPY (EGD) WITH PROPOFOL BIOPSY  Patient Location: Endoscopy Unit  Anesthesia Type:MAC  Level of Consciousness: awake, alert , and oriented  Airway & Oxygen Therapy: Patient Spontanous Breathing  Post-op Assessment: Report given to RN and Post -op Vital signs reviewed and stable  Post vital signs: Reviewed and stable  Last Vitals: see postop VS flowsheet Vitals Value Taken Time  BP    Temp    Pulse    Resp    SpO2      Last Pain:  Vitals:   11/10/22 1226  TempSrc: Temporal  PainSc: 0-No pain         Complications: No notable events documented.

## 2022-11-10 NOTE — Progress Notes (Signed)
Pt IV came out while he was in the shower. Spoke with MD and she stated that it was okay not to have it replaced. PT eating and drinking okay she will place him on oral PRN medication for pain.

## 2022-11-10 NOTE — TOC CM/SW Note (Signed)
Patient already has appointment scheduled with Dr Margarita Rana November 16, 2022 at 2:50 pm. Appointment added to AVS

## 2022-11-10 NOTE — Progress Notes (Signed)
Pt made pallet on floor with blankets and refusing to lay in bed. Says he is more comfortable on the floor.  Educated patient on importance of hospital standards and why this is a safety issue. Patient still declined to lay in bed despite education.

## 2022-11-11 ENCOUNTER — Other Ambulatory Visit (HOSPITAL_COMMUNITY): Payer: Self-pay

## 2022-11-11 ENCOUNTER — Encounter: Payer: Self-pay | Admitting: *Deleted

## 2022-11-11 DIAGNOSIS — J9859 Other diseases of mediastinum, not elsewhere classified: Secondary | ICD-10-CM

## 2022-11-11 DIAGNOSIS — R112 Nausea with vomiting, unspecified: Secondary | ICD-10-CM | POA: Diagnosis not present

## 2022-11-11 DIAGNOSIS — F1290 Cannabis use, unspecified, uncomplicated: Secondary | ICD-10-CM | POA: Diagnosis not present

## 2022-11-11 LAB — MAGNESIUM: Magnesium: 1.9 mg/dL (ref 1.7–2.4)

## 2022-11-11 LAB — CBC
HCT: 33.2 % — ABNORMAL LOW (ref 39.0–52.0)
Hemoglobin: 11.3 g/dL — ABNORMAL LOW (ref 13.0–17.0)
MCH: 32.8 pg (ref 26.0–34.0)
MCHC: 34 g/dL (ref 30.0–36.0)
MCV: 96.2 fL (ref 80.0–100.0)
Platelets: 232 10*3/uL (ref 150–400)
RBC: 3.45 MIL/uL — ABNORMAL LOW (ref 4.22–5.81)
RDW: 12.4 % (ref 11.5–15.5)
WBC: 7.1 10*3/uL (ref 4.0–10.5)
nRBC: 0 % (ref 0.0–0.2)

## 2022-11-11 LAB — BASIC METABOLIC PANEL
Anion gap: 8 (ref 5–15)
BUN: 11 mg/dL (ref 6–20)
CO2: 27 mmol/L (ref 22–32)
Calcium: 9 mg/dL (ref 8.9–10.3)
Chloride: 100 mmol/L (ref 98–111)
Creatinine, Ser: 0.95 mg/dL (ref 0.61–1.24)
GFR, Estimated: >60 mL/min (ref >60)
Glucose, Bld: 105 mg/dL — ABNORMAL HIGH (ref 70–99)
Potassium: 3.3 mmol/L — ABNORMAL LOW (ref 3.5–5.1)
Sodium: 135 mmol/L (ref 135–145)

## 2022-11-11 LAB — SURGICAL PATHOLOGY

## 2022-11-11 LAB — PHOSPHORUS: Phosphorus: 3.3 mg/dL (ref 2.5–4.6)

## 2022-11-11 MED ORDER — POTASSIUM CHLORIDE CRYS ER 20 MEQ PO TBCR
40.0000 meq | EXTENDED_RELEASE_TABLET | Freq: Once | ORAL | Status: AC
  Administered 2022-11-11: 40 meq via ORAL
  Filled 2022-11-11: qty 2

## 2022-11-11 MED ORDER — PANTOPRAZOLE SODIUM 40 MG PO TBEC
40.0000 mg | DELAYED_RELEASE_TABLET | Freq: Every day | ORAL | 0 refills | Status: DC
  Filled 2022-11-11: qty 30, 30d supply, fill #0

## 2022-11-11 MED ORDER — CAPSAICIN 0.025 % EX CREA
TOPICAL_CREAM | Freq: Two times a day (BID) | CUTANEOUS | 0 refills | Status: AC
  Filled 2022-11-11: qty 60, 30d supply, fill #0

## 2022-11-11 NOTE — Discharge Summary (Addendum)
Discharge Summary  Matthew Scott [id number] DOB: Aug 12, 1981  PCP: Matthew Rakes, MD  Admit date: 11/08/2022 Discharge date: 11/11/2022  30 Day Unplanned Readmission Risk Score    Flowsheet Row ED to Hosp-Admission (Current) from 11/08/2022 in [facility]  30 Day Unplanned Readmission Risk Score (%) 23.6 Filed at 11/11/2022 0801       This score is the patient's risk of an unplanned readmission within 30 days of being discharged (0 -100%). The score is based on dignosis, age, lab data, medications, orders, and past utilization.   Low:  0-14.9   Medium: 15-21.9   High: 22-29.9   Extreme: 30 and above         Time spent: 66mins.   Recommendations for Outpatient Follow-up:  F/u with PCP within a week  for hospital discharge follow up, repeat cbc/bmp at follow up F/u with thoracic surgery Matthew Scott for mediastinal mass biopsy   test result to follow-up after discharge : EGD biopsy result      Discharge Diagnoses:  Active Hospital Problems   Diagnosis Date Noted   Cannabinoid hyperemesis syndrome 11/08/2022   Hypertensive urgency 11/08/2022   Lactic acidosis 11/08/2022   Intractable nausea and vomiting 08/04/2018   Gastritis and gastroduodenitis 12/19/2014   Abdominal pain 05/06/2012    Resolved Hospital Problems  No resolved problems to display.    Discharge Condition: stable  Diet recommendation: Regular diet  Filed Weights   11/08/22 1506 11/11/22 0500  Weight: 82.6 kg 75.4 kg    History of present illness: (Per admitting MD Matthew. Myna Scott) Patient coming from: Home    Chief Complaint: Abdominal pain, N/V    HPI: Matthew Scott is a 42 y.o. male with medical history significant for longstanding history of recurrent abdominal pain with nausea and vomiting, now presenting to the emergency department with abdominal pain, nausea, and vomiting.   Patient reports abdominal pain returned 2 days ago, has been severe at  times, usually worse on the right side, and this has been associated with nausea and numerous episodes of nonbloody vomiting.  He has also had some loose stools.  He denies any fevers or chills.  He reports achieving fleeting relief with a hot shower or with smoking marijuana.  Symptoms are the same as what he has experienced many times in the past. He denies alcohol use.      Historically, Matthew Scott has been dealing with these symptoms intermittently since at least 2011 when he had an EGD notable for gastritis with H. pylori.  He has not found relief with antispasmodic or acid suppression.  He was seen by [facility] in 2022 where it was noted that his gastric emptying was only mildly delayed on the study from 2012 and the clinical significance of this was questioned as the patient's symptoms do not seem to fit with gastroparesis.  He was scheduled for EGD in 2022 but ate a sausage biscuit shortly prior to the procedure.  He failed to show up for subsequent appointments and was discharged from the practice.   ED Course: Upon arrival to the ED, patient is found to be afebrile and saturating well on room air with normal heart rate and elevated blood pressure.  EKG demonstrates sinus rhythm with normal QT interval.  CT of the abdomen and pelvis is negative for acute findings.  Labs are most notable for lactic acid 2.5, WBC 13,300, and normal lipase and LFTs.   Patient was given 2  L of LR, 2 doses of Dilaudid, Compazine, and droperidol in the ED.  Hospital Course:  Principal Problem:   Cannabinoid hyperemesis syndrome Active Problems:   Abdominal pain   Gastritis and gastroduodenitis   Intractable nausea and vomiting   Hypertensive urgency   Lactic acidosis   Assessment and Plan:   Intractable aminal pain nausea vomiting -CT abdomen/pel with with contrast no acute findings, -seen by GI ,s/p EGD with mild gastritis, biopsy taken, PPI -UDS + THC, reports hot showers and capsaicin helped, advise  marijuana abstinence, he expressed understanding -Supportive care, advance diet as tolerated, he has improved, tolerated diet advancement, no nausea vomiting, no abdomen pain, he desires to go home     Leukocytosis No sign of infection Wbc normalized   Hypophosphatemia/hypokalemia Replaced   Hypertension urgency Likely due to stress and pain Resolved     Incidental finding on CT scan: Previous cholecystectomy. Two nonobstructing stones in the right kidney, 4 mm in the upper pole and 3 mm in the lower pole.   Findings on CT scan from 10/09/2022 showed large mediastinal mass, f/u with thoracic surgery to consider biopsy    Discharge Exam: BP 113/72 (BP Location: Right Arm)   Pulse (!) 57   Temp 98.1 F (36.7 C) (Oral)   Resp 16   Ht 5\' 9"  (1.753 m)   Wt 75.4 kg   SpO2 99%   BMI 24.55 kg/m   General: NAD Cardiovascular: RRR Respiratory: Normal respiratory effort    Discharge Instructions     Diet general   Complete by: As directed    Increase activity slowly   Complete by: As directed       Allergies as of 11/11/2022   No Known Allergies      Medication List     STOP taking these medications    benzocaine 10 % mucosal gel Commonly known as: ORAJEL   naproxen 500 MG tablet Commonly known as: NAPROSYN   ondansetron 4 MG tablet Commonly known as: ZOFRAN   predniSONE 50 MG tablet Commonly known as: DELTASONE   sucralfate 1 g tablet Commonly known as: Carafate       TAKE these medications    capsaicin 0.025 % cream Commonly known as: ZOSTRIX Apply topically 2 (two) times daily.   capsaicin-methyl sal-menthol 0.025-1-12 % Crea Generic drug: Capsaicin-Menthol-Methyl Sal Apply cream to abdominal wall as needed for abdominal pain. Wash your hands and Avoid touching eyes or genital region after application of cream as it can cause irritation.   methocarbamol 500 MG tablet Commonly known as: ROBAXIN Take 1 tablet (500 mg total) by mouth every  8 (eight) hours as needed for muscle spasms.   Mylanta Maximum Strength 400-400-40 MG/5ML suspension Generic drug: alum & mag hydroxide-simeth Take 15 mLs by mouth every 6 (six) hours as needed for indigestion.   ondansetron 4 MG disintegrating tablet Commonly known as: ZOFRAN-ODT Take 1 tablet (4 mg total) by mouth every 8 (eight) hours as needed for nausea or vomiting.   pantoprazole 40 MG tablet Commonly known as: Protonix Take 1 tablet (40 mg total) by mouth daily. What changed: medication strength       No Known Allergies  Follow-up Information     Matthew Rakes, MD Follow up.   Specialty: Family Medicine Why: November 16, 2022 at 2:50 pm Contact information: [place] [address] [phone]         Medicaid Transportation Follow up.   Contact information: [address], [place] Follow up.   Specialty: Cardiothoracic Surgery Why: for mediastinal mass Contact information: [place] [address] [phone]         Yetta Flock, MD Follow up.   Specialty: Gastroenterology Why: for EGD biopsy result Contact information: [place] [address] [phone]                  The results of significant diagnostics from this hospitalization (including imaging, microbiology, ancillary and laboratory) are listed below for reference.    Significant Diagnostic Studies: DG Abd Portable 1V  Result Date: 11/10/2022 CLINICAL DATA:  Abdominal pain EXAM: PORTABLE ABDOMEN - 1 VIEW COMPARISON:  08/04/2018 FINDINGS: Bowel gas pattern is nonspecific. No abnormal masses or calcifications are seen. Surgical clips are seen in right upper quadrant. Visualized lower lung fields are clear. IMPRESSION: Nonspecific bowel gas pattern. Electronically Signed   By: Elmer Picker M.D.   On: 11/10/2022 08:53   CT ABDOMEN PELVIS W CONTRAST  Result Date:  11/08/2022 CLINICAL DATA:  Right-sided abdominal pain. Nausea vomiting and diarrhea. EXAM: CT ABDOMEN AND PELVIS WITH CONTRAST TECHNIQUE: Multidetector CT imaging of the abdomen and pelvis was performed using the standard protocol following bolus administration of intravenous contrast. RADIATION DOSE REDUCTION: This exam was performed according to the departmental dose-optimization program which includes automated exposure control, adjustment of the mA and/or kV according to patient size and/or use of iterative reconstruction technique. CONTRAST:  41mL OMNIPAQUE IOHEXOL 350 MG/ML SOLN COMPARISON:  09/02/2022 FINDINGS: Lower chest: Lung bases are clear.  No pleural or pericardial fluid. Hepatobiliary: Few small liver cysts as seen previously. No acute or significant finding. Previous cholecystectomy. Pancreas: Normal Spleen: Normal Adrenals/Urinary Tract: Adrenal glands are normal. Left kidney is normal. Right kidney is normal except for a nonobstructing 4 mm stone in the upper pole and 3 mm nonobstructing stone in the lower pole. Bladder is normal. Stomach/Bowel: Stomach and small intestine are normal. The appendix is normal. The colon is normal. Vascular/Lymphatic: Aorta and IVC are normal.  No adenopathy. Reproductive: Normal Other: No free fluid or air. Musculoskeletal: Normal IMPRESSION: 1. No acute finding. No abnormality seen to explain the clinical presentation. 2. Previous cholecystectomy. 3. Two nonobstructing stones in the right kidney, 4 mm in the upper pole and 3 mm in the lower pole. Electronically Signed   By: Nelson Chimes M.D.   On: 11/08/2022 16:02    Microbiology: No results found for this or any previous visit (from the past 240 hour(s)).   Labs: Basic Metabolic Panel: Recent Labs  Lab 11/08/22 1510 11/09/22 0107 11/10/22 0253 11/11/22 0354  NA 142 136 137 135  K 3.7 3.9 3.6 3.3*  CL 106 98 103 100  CO2 20* 24 27 27   GLUCOSE 129* 114* 100* 105*  BUN 8 7 10 11   CREATININE 1.09  0.79 1.03 0.95  CALCIUM 9.8 9.4 9.2 9.0  MG  --  1.8 2.0 1.9  PHOS  --   --  2.1* 3.3   Liver Function Tests: Recent Labs  Lab 11/08/22 1510 11/09/22 0107  AST 29 19  ALT 19 20  ALKPHOS 70 67  BILITOT 1.0 1.3*  PROT 7.1 6.6  ALBUMIN 4.0 3.6   Recent Labs  Lab 11/08/22 1510  LIPASE 22   No results for input(s): "AMMONIA" in the last 168 hours. CBC: Recent Labs  Lab 11/08/22 1510  11/09/22 0107 11/10/22 0253 11/11/22 0354  WBC 13.3* 12.1* 9.2 7.1  NEUTROABS 11.1*  --   --   --   HGB 12.2* 11.4* 11.7* 11.3*  HCT 36.9* 34.8* 36.2* 33.2*  MCV 98.4 98.3 98.6 96.2  PLT 292 237 264 232   Cardiac Enzymes: No results for input(s): "CKTOTAL", "CKMB", "CKMBINDEX", "TROPONINI" in the last 168 hours. BNP: BNP (last 3 results) No results for input(s): "BNP" in the last 8760 hours.  ProBNP (last 3 results) No results for input(s): "PROBNP" in the last 8760 hours.  CBG: No results for input(s): "GLUCAP" in the last 168 hours.  FURTHER DISCHARGE INSTRUCTIONS:   Get Medicines reviewed and adjusted: Please take all your medications with you for your next visit with your Primary MD   Laboratory/radiological data: Please request your Primary MD to go over all hospital tests and procedure/radiological results at the follow up, please ask your Primary MD to get all Hospital records sent to his/her office.   In some cases, they will be blood work, cultures and biopsy results pending at the time of your discharge. Please request that your primary care M.D. goes through all the records of your hospital data and follows up on these results.   Also Note the following: If you experience worsening of your admission symptoms, develop shortness of breath, life threatening emergency, suicidal or homicidal thoughts you must seek medical attention immediately by calling 911 or calling your MD immediately  if symptoms less severe.   You must read complete instructions/literature along with  all the possible adverse reactions/side effects for all the Medicines you take and that have been prescribed to you. Take any new Medicines after you have completely understood and accpet all the possible adverse reactions/side effects.    Do not drive when taking Pain medications or sleeping medications (Benzodaizepines)   Do not take more than prescribed Pain, Sleep and Anxiety Medications. It is not advisable to combine anxiety,sleep and pain medications without talking with your primary care practitioner   Special Instructions: If you have smoked or chewed Tobacco  in the last 2 yrs please stop smoking, stop any regular Alcohol  and or any Recreational drug use.   Wear Seat belts while driving.   Please note: You were cared for by a hospitalist during your hospital stay. Once you are discharged, your primary care physician will handle any further medical issues. Please note that NO REFILLS for any discharge medications will be authorized once you are discharged, as it is imperative that you return to your primary care physician (or establish a relationship with a primary care physician if you do not have one) for your post hospital discharge needs so that they can reassess your need for medications and monitor your lab values.     Signed:  Florencia Reasons MD, PhD, FACP  [facility] 11/11/2022, 10:05 AM

## 2022-11-14 ENCOUNTER — Encounter (HOSPITAL_COMMUNITY): Payer: Self-pay | Admitting: Gastroenterology

## 2022-11-14 ENCOUNTER — Telehealth: Payer: Self-pay

## 2022-11-14 NOTE — Transitions of Care (Post Inpatient/ED Visit) (Signed)
   11/14/2022  Name: Matthew Scott MRN: [id number] DOB: 03/29/1981  Today's TOC FU Call Status: Today's TOC FU Call Status:: Unsuccessul Call (1st Attempt) Unsuccessful Call (1st Attempt) Date: 11/14/22  Attempted to reach the patient regarding the most recent Inpatient/ED visit.  Follow Up Plan: Additional outreach attempts will be made to reach the patient to complete the Transitions of Care (Post Inpatient/ED visit) call.   Patient has an appointment at [facility] 43/2024 with Dr Sarita Haver   Renaee Munda

## 2022-11-15 ENCOUNTER — Encounter: Payer: Self-pay | Admitting: *Deleted

## 2022-11-15 ENCOUNTER — Telehealth: Payer: Self-pay

## 2022-11-15 NOTE — Progress Notes (Signed)
Notified Dr. Abran Duke office via telephone that referral has been entered per ED documentation.

## 2022-11-15 NOTE — Transitions of Care (Post Inpatient/ED Visit) (Signed)
   11/15/2022  Name: Matthew Scott MRN: [id number] DOB: 12/08/1980  Today's TOC FU Call Status: Today's TOC FU Call Status:: Unsuccessful Call (2nd Attempt) Unsuccessful Call (1st Attempt) Date: 11/14/22 Unsuccessful Call (2nd Attempt) Date: 11/15/22  Attempted to reach the patient regarding the most recent Inpatient/ED visit.  Follow Up Plan: Additional outreach attempts will be made to reach the patient to complete the Transitions of Care (Post Inpatient/ED visit) call.    Patient has an appointment scheduled at [facility] tomorrow, 11/16/2022,  with Dr Sarita Haver Eden Lathe, RN

## 2022-11-16 ENCOUNTER — Encounter: Payer: Medicaid Other | Admitting: Family Medicine

## 2022-11-16 ENCOUNTER — Telehealth: Payer: Self-pay

## 2022-11-16 NOTE — Transitions of Care (Post Inpatient/ED Visit) (Signed)
   11/16/2022  Name: Matthew Scott MRN: [id number] DOB: 07/11/1981  Today's TOC FU Call Status: Today's TOC FU Call Status:: Unsuccessful Call (3rd Attempt) Unsuccessful Call (1st Attempt) Date: 11/14/22 Unsuccessful Call (2nd Attempt) Date: 11/15/22 Unsuccessful Call (3rd Attempt) Date: 11/16/22  Attempted to reach the patient regarding the most recent Inpatient/ED visit.  Follow Up Plan: No further outreach attempts will be made at this time. We have been unable to contact the patient.  The patient was a no show for his appointment with Dr Margarita Rana today at [facility].  Letter sent to him requesting he contact the office to schedule another appointment as we have not been able to reach him  Signature  Eden Lathe, RN

## 2022-11-30 NOTE — Progress Notes (Deleted)
[address]       [place] [address]             [phone]                    [name] [facility] Medical Record #[id number] Date of Birth: 04/13/1981  Referring: Tilden Fossa, MD Primary Care: Hoy Register, MD Primary Cardiologist: None  Chief Complaint:   No chief complaint on file.   History of Present Illness:    Matthew Scott 42 y.o. 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   Qualifier: Diagnosis of  By: Daphine Deutscher FNP, Nykedtra     Chronic abdominal pain    Cyclical vomiting syndrome    GASTRITIS 12/09/2009   Qualifier: Diagnosis of  By: Daphine Deutscher FNP, Nykedtra     Gastroparesis    HELICOBACTER PYLORI INFECTION 12/14/2009   Qualifier: Diagnosis of  By: Levon Hedger     Nausea and vomiting    chronic, recurrent   Polysubstance abuse     Past Surgical History:  Procedure Laterality Date   BIOPSY  11/10/2022   Procedure: BIOPSY;  Surgeon: Benancio Deeds, MD;  Location: [facility] ENDOSCOPY;  Service: Gastroenterology;;   CHOLECYSTECTOMY  11/2009   ESOPHAGOGASTRODUODENOSCOPY (EGD) WITH PROPOFOL N/A 11/10/2022   Procedure: ESOPHAGOGASTRODUODENOSCOPY (EGD) WITH PROPOFOL;  Surgeon: Benancio Deeds, MD;  Location: [facility] ENDOSCOPY;  Service: Gastroenterology;  Laterality: N/A;    Family History  Problem Relation Age of Onset   Other Father    Diabetes Father    Hypertension Father    Pancreatic cancer Father    Colon cancer Neg Hx    Esophageal cancer Neg Hx    Liver cancer Neg Hx      Social History   Tobacco Use  Smoking Status Some Days   Types: Cigars   Last attempt to quit: 12/15/2014   Years since quitting: 7.9  Smokeless Tobacco Never  Tobacco Comments   7 cigars infused with marijuana a day    Social History   Substance and Sexual Activity  Alcohol Use No   Alcohol/week: 0.0 standard drinks of alcohol     No Known Allergies  Current Outpatient  Medications  Medication Sig Dispense Refill   alum & mag hydroxide-simeth (MYLANTA MAXIMUM STRENGTH) 400-400-40 MG/5ML suspension Take 15 mLs by mouth every 6 (six) hours as needed for indigestion. 355 mL 0   capsaicin (ZOSTRIX) 0.025 % cream Apply topically 2 (two) times daily. 60 g 0   Capsaicin-Menthol-Methyl Sal (CAPSAICIN-METHYL SAL-MENTHOL) 0.025-1-12 % CREA Apply cream to abdominal wall as needed for abdominal pain. Wash your hands and Avoid touching eyes or genital region after application of cream as it can cause irritation. (Patient not taking: Reported on 11/10/2022) 56.6 g 1   methocarbamol (ROBAXIN) 500 MG tablet Take 1 tablet (500 mg total) by mouth every 8 (eight) hours as needed for muscle spasms. 20 tablet 0   ondansetron (ZOFRAN-ODT) 4 MG disintegrating tablet Take 1 tablet (4 mg total) by mouth every 8 (eight) hours as needed for nausea or vomiting. 20 tablet 0   pantoprazole (PROTONIX) 40 MG tablet Take 1 tablet (40 mg total) by mouth daily. 30 tablet 0   No current facility-administered medications for this visit.    ROS  PHYSICAL EXAMINATION: There were no vitals taken for this visit.  Physical Exam   Diagnostic Studies & Laboratory  data:     Recent Radiology Findings:   DG Abd Portable 1V  Result Date: 11/10/2022 CLINICAL DATA:  Abdominal pain EXAM: PORTABLE ABDOMEN - 1 VIEW COMPARISON:  08/04/2018 FINDINGS: Bowel gas pattern is nonspecific. No abnormal masses or calcifications are seen. Surgical clips are seen in right upper quadrant. Visualized lower lung fields are clear. IMPRESSION: Nonspecific bowel gas pattern. Electronically Signed   By: Ernie Avena M.D.   On: 11/10/2022 08:53   CT ABDOMEN PELVIS W CONTRAST  Result Date: 11/08/2022 CLINICAL DATA:  Right-sided abdominal pain. Nausea vomiting and diarrhea. EXAM: CT ABDOMEN AND PELVIS WITH CONTRAST TECHNIQUE: Multidetector CT imaging of the abdomen and pelvis was performed using the standard protocol  following bolus administration of intravenous contrast. RADIATION DOSE REDUCTION: This exam was performed according to the departmental dose-optimization program which includes automated exposure control, adjustment of the mA and/or kV according to patient size and/or use of iterative reconstruction technique. CONTRAST:  75mL OMNIPAQUE IOHEXOL 350 MG/ML SOLN COMPARISON:  09/02/2022 FINDINGS: Lower chest: Lung bases are clear.  No pleural or pericardial fluid. Hepatobiliary: Few small liver cysts as seen previously. No acute or significant finding. Previous cholecystectomy. Pancreas: Normal Spleen: Normal Adrenals/Urinary Tract: Adrenal glands are normal. Left kidney is normal. Right kidney is normal except for a nonobstructing 4 mm stone in the upper pole and 3 mm nonobstructing stone in the lower pole. Bladder is normal. Stomach/Bowel: Stomach and small intestine are normal. The appendix is normal. The colon is normal. Vascular/Lymphatic: Aorta and IVC are normal.  No adenopathy. Reproductive: Normal Other: No free fluid or air. Musculoskeletal: Normal IMPRESSION: 1. No acute finding. No abnormality seen to explain the clinical presentation. 2. Previous cholecystectomy. 3. Two nonobstructing stones in the right kidney, 4 mm in the upper pole and 3 mm in the lower pole. Electronically Signed   By: Paulina Fusi M.D.   On: 11/08/2022 16:02       I have independently reviewed the above radiology studies  and reviewed the findings with the patient.   Recent Lab Findings: Lab Results  Component Value Date   WBC 7.1 11/11/2022   HGB 11.3 (L) 11/11/2022   HCT 33.2 (L) 11/11/2022   PLT 232 11/11/2022   GLUCOSE 105 (H) 11/11/2022   CHOL  11/24/2008    150        ATP III CLASSIFICATION:  <200     mg/dL   Desirable  119-147  mg/dL   Borderline High  >=829    mg/dL   High          TRIG 31 11/24/2008   HDL 52 11/24/2008   LDLCALC  11/24/2008    92        Total Cholesterol/HDL:CHD Risk Coronary Heart  Disease Risk Table                     Men   Women  1/2 Average Risk   3.4   3.3  Average Risk       5.0   4.4  2 X Average Risk   9.6   7.1  3 X Average Risk  23.4   11.0        Use the calculated Patient Ratio above and the CHD Risk Table to determine the patient's CHD Risk.        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  I will order tumor markers, and a PET/CT.  It appears to not invade the pericardium.  ***      Corliss Skains 11/30/2022 10:49 AM

## 2022-12-01 ENCOUNTER — Encounter: Payer: Self-pay | Admitting: *Deleted

## 2022-12-01 NOTE — Progress Notes (Signed)
I called patient to remind him of his appointment with Dr. Cliffton Asters.  Patient aware of time and location.  He verbalized understanding and denied any questions.

## 2022-12-02 ENCOUNTER — Encounter: Payer: Medicaid Other | Admitting: Thoracic Surgery (Cardiothoracic Vascular Surgery)

## 2022-12-02 ENCOUNTER — Encounter: Payer: Self-pay | Admitting: Thoracic Surgery (Cardiothoracic Vascular Surgery)

## 2022-12-15 NOTE — Progress Notes (Signed)
Reached to pt on phone number listed in demographics. Pts father answered the phone telling NN that the pt isn't here. NN provided pts father with number to [facility] surgery clinic and requested that he call to reschedule his appt. Pts father verbalized understanding.

## 2022-12-19 ENCOUNTER — Ambulatory Visit
Admission: EM | Admit: 2022-12-19 | Discharge: 2022-12-19 | Disposition: A | Discharge status: Home / Self Care | Payer: Medicaid Other | Attending: Urgent Care | Admitting: Urgent Care

## 2022-12-19 ENCOUNTER — Other Ambulatory Visit: Payer: Self-pay

## 2022-12-19 DIAGNOSIS — B029 Zoster without complications: Secondary | ICD-10-CM | POA: Diagnosis not present

## 2022-12-19 MED ORDER — HYDROCODONE-ACETAMINOPHEN 5-325 MG PO TABS
1.0000 | ORAL_TABLET | Freq: Four times a day (QID) | ORAL | 0 refills | Status: AC | PRN
  Filled 2022-12-19: qty 10, 3d supply, fill #0

## 2022-12-19 MED ORDER — GABAPENTIN 600 MG PO TABS
600.0000 mg | ORAL_TABLET | Freq: Two times a day (BID) | ORAL | 0 refills | Status: AC
  Filled 2022-12-19: qty 30, 15d supply, fill #0

## 2022-12-19 MED ORDER — VALACYCLOVIR HCL 1 G PO TABS
1000.0000 mg | ORAL_TABLET | Freq: Three times a day (TID) | ORAL | 0 refills | Status: AC
  Filled 2022-12-19: qty 30, 10d supply, fill #0

## 2022-12-19 NOTE — ED Triage Notes (Signed)
Reports a painful and itching rash in abdomen and right side of back. Reports he was cutting bushed.

## 2022-12-19 NOTE — ED Provider Notes (Signed)
[facility]RG[facility]  Note:  This document was prepared using Conservation officer, historic buildings and may include unintentional dictation errors.  MRN: [id number] DOB: 1981-04-28  Subjective:   Matthew Scott is a 42 y.o. male presenting for several day history of a severely painful blister rash over right abdomen extending toward the right flank side.  Patient did do some yard work and was worried it might be poison ivy.  No itching.  No current facility-administered medications for this encounter.  Current Outpatient Medications:    alum & mag hydroxide-simeth (MYLANTA MAXIMUM STRENGTH) 400-400-40 MG/5ML suspension, Take 15 mLs by mouth every 6 (six) hours as needed for indigestion., Disp: 355 mL, Rfl: 0   capsaicin (ZOSTRIX) 0.025 % cream, Apply topically 2 (two) times daily., Disp: 60 g, Rfl: 0   Capsaicin-Menthol-Methyl Sal (CAPSAICIN-METHYL SAL-MENTHOL) 0.025-1-12 % CREA, Apply cream to abdominal wall as needed for abdominal pain. Wash your hands and Avoid touching eyes or genital region after application of cream as it can cause irritation. 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   Qualifier: Diagnosis of  By: Daphine Deutscher FNP, Nykedtra     Chronic abdominal pain    Cyclical vomiting syndrome    GASTRITIS 12/09/2009   Qualifier: Diagnosis of  By: Daphine Deutscher FNP, Nykedtra     Gastroparesis    HELICOBACTER PYLORI INFECTION 12/14/2009    Qualifier: Diagnosis of  By: Levon Hedger     Nausea and vomiting    chronic, recurrent   Polysubstance abuse [facility])      Past Surgical History:  Procedure Laterality Date   BIOPSY  11/10/2022   Procedure: BIOPSY;  Surgeon: Benancio Deeds, MD;  Location: [facility] ENDOSCOPY;  Service: Gastroenterology;;   CHOLECYSTECTOMY  11/2009   ESOPHAGOGASTRODUODENOSCOPY (EGD) WITH PROPOFOL N/A 11/10/2022   Procedure: ESOPHAGOGASTRODUODENOSCOPY (EGD) WITH PROPOFOL;  Surgeon: Benancio Deeds, MD;  Location: [facility] ENDOSCOPY;  Service: Gastroenterology;  Laterality: N/A;    Family History  Problem Relation Age of Onset   Other Father    Diabetes Father    Hypertension Father    Pancreatic cancer Father    Colon cancer Neg Hx    Esophageal cancer Neg Hx    Liver cancer Neg Hx     Social History   Tobacco Use   Smoking status: Some Days    Types: Cigars    Last attempt to quit: 12/15/2014    Years since quitting: 8.0   Smokeless tobacco: Never   Tobacco comments:    7 cigars infused with marijuana a day  Vaping Use   Vaping Use: Never used  Substance Use Topics   Alcohol use: No    Alcohol/week: 0.0 standard drinks of alcohol   Drug use: Yes    Types: Marijuana    Comment: 6 hrs PTA    ROS   Objective:   Vitals: BP 131/78 (BP Location: Right  Arm)   Pulse 65   Temp 98.9 F (37.2 C) (Oral)   Resp 18   SpO2 98%   Physical Exam Constitutional:      General: He is not in acute distress.    Appearance: Normal appearance. He is well-developed and normal weight. He is not ill-appearing, toxic-appearing or diaphoretic.  HENT:     Head: Normocephalic and atraumatic.     Right Ear: External ear normal.     Left Ear: External ear normal.     Nose: Nose normal.     Mouth/Throat:     Pharynx: Oropharynx is clear.  Eyes:     General: No scleral icterus.       Right eye: No discharge.        Left eye: No discharge.     Extraocular Movements: Extraocular movements intact.   Cardiovascular:     Rate and Rhythm: Normal rate.  Pulmonary:     Effort: Pulmonary effort is normal.  Musculoskeletal:     Cervical back: Normal range of motion.  Skin:      Neurological:     Mental Status: He is alert and oriented to person, place, and time.  Psychiatric:        Mood and Affect: Mood normal.        Behavior: Behavior normal.        Thought Content: Thought content normal.        Judgment: Judgment normal.     Assessment and Plan :   I have reviewed the PDMP during this encounter.  1. Herpes zoster without complication    Recommended starting valacyclovir.  Use Tylenol and/or ibuprofen, gabapentin for his herpetic neuralgia.  Can use hydrocodone for breakthrough pain.  Counseled patient on potential for adverse effects with medications prescribed/recommended today, ER and return-to-clinic precautions discussed, patient verbalized understanding.    [name], [place] 12/19/22 1530

## 2022-12-19 NOTE — Discharge Instructions (Addendum)
Start taking valacyclovir to help with the shingles rash.  You can also use Tylenol and ibuprofen for pain relief.  Gabapentin can help with nerve pain including burning sensations, sharp stabbing pains, tingling and prickling sensations.  Try half tablets first and then increase as necessary to the full tablet.  If none of these measures help you with your pain then you can use the hydrocodone.  As it is a narcotic medicine, I do not recommend using it unless you need it.

## 2023-04-12 ENCOUNTER — Emergency Department (HOSPITAL_COMMUNITY): Payer: Medicaid Other

## 2023-04-12 ENCOUNTER — Encounter (HOSPITAL_COMMUNITY): Payer: Self-pay

## 2023-04-12 ENCOUNTER — Emergency Department (HOSPITAL_COMMUNITY)
Admission: EM | Admit: 2023-04-12 | Discharge: 2023-04-12 | Disposition: A | Discharge status: Home / Self Care | Payer: Medicaid Other

## 2023-04-12 ENCOUNTER — Other Ambulatory Visit: Payer: Self-pay

## 2023-04-12 DIAGNOSIS — U071 COVID-19: Secondary | ICD-10-CM | POA: Diagnosis not present

## 2023-04-12 DIAGNOSIS — R519 Headache, unspecified: Secondary | ICD-10-CM | POA: Diagnosis present

## 2023-04-12 LAB — RESP PANEL BY RT-PCR (RSV, FLU A&B, COVID)  RVPGX2
Influenza A by PCR: NEGATIVE
Influenza B by PCR: NEGATIVE
Resp Syncytial Virus by PCR: NEGATIVE
SARS Coronavirus 2 by RT PCR: POSITIVE — AB

## 2023-04-12 LAB — CBC
HCT: 38.4 % — ABNORMAL LOW (ref 39.0–52.0)
Hemoglobin: 12.2 g/dL — ABNORMAL LOW (ref 13.0–17.0)
MCH: 31.9 pg (ref 26.0–34.0)
MCHC: 31.8 g/dL (ref 30.0–36.0)
MCV: 100.3 fL — ABNORMAL HIGH (ref 80.0–100.0)
Platelets: 233 10*3/uL (ref 150–400)
RBC: 3.83 MIL/uL — ABNORMAL LOW (ref 4.22–5.81)
RDW: 12.7 % (ref 11.5–15.5)
WBC: 8 10*3/uL (ref 4.0–10.5)
nRBC: 0 % (ref 0.0–0.2)

## 2023-04-12 LAB — COMPREHENSIVE METABOLIC PANEL
ALT: 14 U/L (ref 0–44)
AST: 16 U/L (ref 15–41)
Albumin: 4.5 g/dL (ref 3.5–5.0)
Alkaline Phosphatase: 62 U/L (ref 38–126)
Anion gap: 11 (ref 5–15)
BUN: 9 mg/dL (ref 6–20)
CO2: 22 mmol/L (ref 22–32)
Calcium: 9.7 mg/dL (ref 8.9–10.3)
Chloride: 105 mmol/L (ref 98–111)
Creatinine, Ser: 0.96 mg/dL (ref 0.61–1.24)
GFR, Estimated: >60 mL/min (ref >60)
Glucose, Bld: 105 mg/dL — ABNORMAL HIGH (ref 70–99)
Potassium: 3.7 mmol/L (ref 3.5–5.1)
Sodium: 138 mmol/L (ref 135–145)
Total Bilirubin: 1.5 mg/dL — ABNORMAL HIGH (ref 0.3–1.2)
Total Protein: 7.5 g/dL (ref 6.5–8.1)

## 2023-04-12 LAB — LIPASE, BLOOD: Lipase: 20 U/L (ref 11–51)

## 2023-04-12 MED ORDER — ACETAMINOPHEN 325 MG PO TABS
650.0000 mg | ORAL_TABLET | Freq: Once | ORAL | Status: DC | PRN
  Filled 2023-04-12: qty 2

## 2023-04-12 MED ORDER — ONDANSETRON HCL 4 MG PO TABS: 4.0000 mg | ORAL_TABLET | Freq: Four times a day (QID) | ORAL | 0 refills | Status: AC

## 2023-04-12 NOTE — ED Provider Notes (Signed)
[place] EMERGENCY DEPARTMENT AT [facility] Provider Note   CSN: [id number] Arrival date & time: 04/12/23  6962     History  Chief Complaint  Patient presents with   Abdominal Pain    Matthew Scott is a 42 y.o. male.  42 year old male present emergency department with generalized malaise, headache, nausea no vomiting and some epigastric discomfort.  Coworker with COVID yesterday.  Denies vision changes, chest pain.  Did endorse some mild shortness of breath.  No urinary symptoms.   Abdominal Pain      Home Medications Prior to Admission medications   Medication Sig Start Date End Date Taking? Authorizing Provider  alum & mag hydroxide-simeth (MYLANTA MAXIMUM STRENGTH) 400-400-40 MG/5ML suspension Take 15 mLs by mouth every 6 (six) hours as needed for indigestion. 09/02/22   Lonell Grandchild, MD  capsaicin (ZOSTRIX) 0.025 % cream Apply topically 2 (two) times daily. 11/11/22   Albertine Grates, MD  Capsaicin-Menthol-Methyl Sal (CAPSAICIN-METHYL SAL-MENTHOL) 0.025-1-12 % CREA Apply cream to abdominal wall as needed for abdominal pain. Wash your hands and Avoid touching eyes or genital region after application of cream as it can cause irritation. Patient not taking: Reported on 11/10/2022 11/08/22   Prosperi, Christian H, PA-C  gabapentin (NEURONTIN) 600 MG tablet Take 1 tablet (600 mg total) by mouth 2 (two) times daily. 12/19/22   [name], PA-C  HYDROcodone-acetaminophen (NORCO/VICODIN) 5-325 MG tablet Take 1 tablet by mouth every 6 (six) hours as needed for severe pain. 12/19/22   [name], PA-C  methocarbamol (ROBAXIN) 500 MG tablet Take 1 tablet (500 mg total) by mouth every 8 (eight) hours as needed for muscle spasms. 10/09/22   Rancour, Jeannett Senior, MD  ondansetron (ZOFRAN-ODT) 4 MG disintegrating tablet Take 1 tablet (4 mg total) by mouth every 8 (eight) hours as needed for nausea or vomiting. 09/02/22   Lonell Grandchild, MD  pantoprazole (PROTONIX) 40 MG tablet Take 1  tablet (40 mg total) by mouth daily. 11/11/22 12/11/22  Albertine Grates, MD  valACYclovir (VALTREX) 1000 MG tablet Take 1 tablet (1,000 mg total) by mouth 3 (three) times daily. 12/19/22   [name], PA-C      Allergies    Patient has no known allergies.    Review of Systems   Review of Systems  Gastrointestinal:  Positive for abdominal pain.    Physical Exam Updated Vital Signs BP (!) 138/95   Pulse 95   Temp 100.3 F (37.9 C) (Oral)   Resp 18   Ht 5\' 9"  (1.753 m)   Wt 83.9 kg   SpO2 95%   BMI 27.32 kg/m  Physical Exam Vitals and nursing note reviewed.  Constitutional:      General: He is not in acute distress.    Appearance: He is not toxic-appearing.  HENT:     Head: Normocephalic.  Cardiovascular:     Rate and Rhythm: Normal rate and regular rhythm.  Pulmonary:     Effort: Pulmonary effort is normal.     Breath sounds: Normal breath sounds.  Abdominal:     General: Abdomen is flat.     Palpations: Abdomen is soft.     Tenderness: There is no abdominal tenderness.  Neurological:     Mental Status: He is alert.     ED Results / Procedures / Treatments   Labs (all labs ordered are listed, but only abnormal results are displayed) Labs Reviewed  RESP PANEL BY RT-PCR (RSV, FLU A&B, COVID)  RVPGX2 - Abnormal;  Notable for the following components:      Result Value   SARS Coronavirus 2 by RT PCR POSITIVE (*)    All other components within normal limits  COMPREHENSIVE METABOLIC PANEL - Abnormal; Notable for the following components:   Glucose, Bld 105 (*)    Total Bilirubin 1.5 (*)    All other components within normal limits  CBC - Abnormal; Notable for the following components:   RBC 3.83 (*)    Hemoglobin 12.2 (*)    HCT 38.4 (*)    MCV 100.3 (*)    All other components within normal limits  SARS CORONAVIRUS 2 BY RT PCR  LIPASE, BLOOD  URINALYSIS, ROUTINE W REFLEX MICROSCOPIC    EKG None  Radiology DG Chest Portable 1 View  Result Date:  04/12/2023 CLINICAL DATA:  COVID EXAM: PORTABLE CHEST 1 VIEW COMPARISON:  CT Chest 10/09/22 FINDINGS: No pleural effusion. No pneumothorax. Unchanged cardiac and mediastinal contours, including the anterior mediastinal mass. No focal airspace opacity. No radiographically apparent displaced rib fractures. Visualized upper abdomen unremarkable. IMPRESSION: 1. No focal airspace opacity. 2. Unchanged mediastinal contours compared to prior chest radiograph Electronically Signed   By: Lorenza Cambridge M.D.   On: 04/12/2023 10:38    Procedures Procedures    Medications Ordered in ED Medications  acetaminophen (TYLENOL) tablet 650 mg (has no administration in time range)    ED Course/ Medical Decision Making/ A&P Clinical Course as of 04/12/23 1044  Wed Apr 12, 2023  1041 Labs reassuring.  No leukocytosis or tachycardia to suggest systemic infection.  He is COVID-positive.  Chest x-ray without pneumonia consolidation.  His abdominal pain likely secondary to underlying viral illness.  He has no transaminitis or elevated bilirubin distress hepatobiliary disease.  Lipase negative.  He has a reassuring abdominal exam as well with soft nontender nonsurgical abdomen.  No significant metabolic derangements.  Discussed supportive care with patient.  Will discharge with Zofran. [TY]    Clinical Course User Index [TY] Coral Spikes, DO                                 Medical Decision Making 42 year old with viral syndrome complaints.  COVID-positive.  See ED course for MDM.  Amount and/or Complexity of Data Reviewed External Data Reviewed: notes.    Details: Per chart review has a history of chronic abdominal pain with cyclical vomiting, admitted in March 2024 Labs: ordered. Radiology: ordered.  Risk OTC drugs. Decision regarding hospitalization. Risk Details: Patient COVID-positive, reassuring exam, stable vitals labs.  Unlikely benefit from hospitalization at this time.          Final  Clinical Impression(s) / ED Diagnoses Final diagnoses:  None    Rx / DC Orders ED Discharge Orders     None         Coral Spikes, DO 04/12/23 1044

## 2023-04-12 NOTE — ED Triage Notes (Signed)
Pt coming in today complaining of chills, headache, fatigue, and abd pain that has been going on since yesterday. One of pts co-workers dx with covid yesterday.

## 2023-07-27 ENCOUNTER — Other Ambulatory Visit: Payer: Self-pay

## 2023-09-12 ENCOUNTER — Other Ambulatory Visit: Payer: Self-pay

## 2023-09-12 ENCOUNTER — Emergency Department (HOSPITAL_COMMUNITY)
Admission: EM | Admit: 2023-09-12 | Discharge: 2023-09-12 | Discharge status: Against Medical Advice | Payer: Medicaid Other | Attending: Emergency Medicine | Admitting: Emergency Medicine

## 2023-09-12 ENCOUNTER — Emergency Department (HOSPITAL_COMMUNITY): Payer: Medicaid Other

## 2023-09-12 DIAGNOSIS — M545 Low back pain, unspecified: Secondary | ICD-10-CM | POA: Diagnosis present

## 2023-09-12 DIAGNOSIS — Z20822 Contact with and (suspected) exposure to covid-19: Secondary | ICD-10-CM | POA: Insufficient documentation

## 2023-09-12 DIAGNOSIS — R059 Cough, unspecified: Secondary | ICD-10-CM | POA: Insufficient documentation

## 2023-09-12 DIAGNOSIS — Z5321 Procedure and treatment not carried out due to patient leaving prior to being seen by health care provider: Secondary | ICD-10-CM | POA: Diagnosis not present

## 2023-09-12 LAB — RESP PANEL BY RT-PCR (RSV, FLU A&B, COVID)  RVPGX2
Influenza A by PCR: NEGATIVE
Influenza B by PCR: NEGATIVE
Resp Syncytial Virus by PCR: NEGATIVE
SARS Coronavirus 2 by RT PCR: NEGATIVE

## 2023-09-12 NOTE — ED Notes (Signed)
Pt called twice to be roomed and no answer

## 2023-09-12 NOTE — ED Triage Notes (Addendum)
PT arrives via POV. PT reports he had a bad coughing spell 3 days ago. PT reports since then, he has been having bad Lower back pain that will occasionally radiate up to his chest. Pt AxOx4. Denies sob or other symptoms. NAD

## 2023-09-12 NOTE — ED Provider Triage Note (Signed)
Emergency Medicine Provider Triage Evaluation Note  Matthew Scott , a 43 y.o. male  was evaluated in triage.  Pt complains of low back pain x 3 days, cough x 4 days. Nonproductive cough. Had one episode of worsening cough and felt like he strained his back. Has not taken any medication for this. Felt he may have had a fever the other day but didn't check his temperature. Back pain intermittently radiates to the right side of his chest and upper abdomen.   Review of Systems  Positive: Cough, back pain Negative: N/V/D, urinary sx, weakness, numbness, saddle paresthesias   Physical Exam  BP (!) 133/119 (BP Location: Right Arm)   Pulse 84   Temp 98.4 F (36.9 C) (Oral)   Resp 17   Ht 5\' 9"  (1.753 m)   Wt 84.4 kg   SpO2 99%   BMI 27.47 kg/m  Gen:   Awake, no distress   Resp:  Normal effort  MSK:   Moves extremities without difficulty  Other:  R lumbar paraspinal muscular tenderness, normal sensation in lower extremities  Medical Decision Making  Medically screening exam initiated at 10:40 AM.  Appropriate orders placed.  JERIS ROSER was informed that the remainder of the evaluation will be completed by another provider, this initial triage assessment does not replace that evaluation, and the importance of remaining in the ED until their evaluation is complete.  Initial workup ordered including UA   Denesha Brouse T, PA-C 09/12/23 1046

## 2023-09-20 ENCOUNTER — Emergency Department (HOSPITAL_COMMUNITY)
Admission: EM | Admit: 2023-09-20 | Discharge: 2023-09-20 | Disposition: A | Discharge status: Home / Self Care | Payer: Medicaid Other | Attending: Emergency Medicine | Admitting: Emergency Medicine

## 2023-09-20 ENCOUNTER — Encounter (HOSPITAL_COMMUNITY): Payer: Self-pay

## 2023-09-20 ENCOUNTER — Other Ambulatory Visit: Payer: Self-pay

## 2023-09-20 DIAGNOSIS — M545 Low back pain, unspecified: Secondary | ICD-10-CM | POA: Diagnosis present

## 2023-09-20 DIAGNOSIS — R059 Cough, unspecified: Secondary | ICD-10-CM | POA: Diagnosis not present

## 2023-09-20 MED ORDER — CYCLOBENZAPRINE HCL 10 MG PO TABS: 10.0000 mg | ORAL_TABLET | Freq: Three times a day (TID) | ORAL | 0 refills | Status: AC

## 2023-09-20 MED ORDER — HYDROCODONE-ACETAMINOPHEN 5-325 MG PO TABS: 1.0000 | ORAL_TABLET | Freq: Four times a day (QID) | ORAL | 0 refills | Status: AC | PRN

## 2023-09-20 NOTE — Discharge Instructions (Signed)
 Rest is much as possible.  Take the pain medicine as directed take the muscle relaxer as directed.  Would expect improvement over the next 7 days if not improving follow-up with your doctor.  May require some specialized imaging at that point in time.

## 2023-09-20 NOTE — ED Triage Notes (Signed)
 Patient is here for evaluation of back pain after coughing. Also reports that his pain is now in his right thigh. Denies any injuries or falls.

## 2023-09-20 NOTE — ED Provider Notes (Signed)
 [place] EMERGENCY DEPARTMENT AT [facility] Provider Note   CSN: [id number] Arrival date & time: 09/20/23  1155     History  Chief Complaint  Patient presents with   Back Pain    Matthew Scott is a 43 y.o. male.  Patient about 4 days ago had a significant cough out on the front porch and then got lower back pain that radiates into the right thigh area.  More anterior.  Does not involve the scrotum or testicle.  No fall or injury.  Was worse a couple days ago starting to get little bit better but still a lot of discomfort when he moves.  That is sort of in the right side of the low back.  No numbness or weakness to the right foot.  Patient tried to get seen at [facility] at the end of January.  But the weights were too long.       Home Medications Prior to Admission medications   Medication Sig Start Date End Date Taking? Authorizing Provider  alum & mag hydroxide-simeth (MYLANTA MAXIMUM STRENGTH) 400-400-40 MG/5ML suspension Take 15 mLs by mouth every 6 (six) hours as needed for indigestion. 09/02/22   Francesca Elsie CROME, MD  capsaicin  (ZOSTRIX) 0.025 % cream Apply topically 2 (two) times daily. 11/11/22   Jerri Keys, MD  Capsaicin -Menthol -Methyl Sal (CAPSAICIN -METHYL SAL-MENTHOL ) 0.025-1-12 % CREA Apply cream to abdominal wall as needed for abdominal pain. Wash your hands and Avoid touching eyes or genital region after application of cream as it can cause irritation. Patient not taking: Reported on 11/10/2022 11/08/22   Prosperi, Christian H, PA-C  gabapentin  (NEURONTIN ) 600 MG tablet Take 1 tablet (600 mg total) by mouth 2 (two) times daily. 12/19/22   Christopher Savannah, PA-C  HYDROcodone -acetaminophen  (NORCO/VICODIN) 5-325 MG tablet Take 1 tablet by mouth every 6 (six) hours as needed for severe pain. 12/19/22   Christopher Savannah, PA-C  methocarbamol  (ROBAXIN ) 500 MG tablet Take 1 tablet (500 mg total) by mouth every 8 (eight) hours as needed for muscle spasms. 10/09/22   Rancour,  Garnette, MD  ondansetron  (ZOFRAN -ODT) 4 MG disintegrating tablet Take 1 tablet (4 mg total) by mouth every 8 (eight) hours as needed for nausea or vomiting. 09/02/22   Francesca Elsie CROME, MD  pantoprazole  (PROTONIX ) 40 MG tablet Take 1 tablet (40 mg total) by mouth daily. 11/11/22 12/11/22  Jerri Keys, MD  valACYclovir  (VALTREX ) 1000 MG tablet Take 1 tablet (1,000 mg total) by mouth 3 (three) times daily. 12/19/22   Christopher Savannah, PA-C      Allergies    Patient has no known allergies.    Review of Systems   Review of Systems  Constitutional:  Negative for chills and fever.  HENT:  Negative for ear pain and sore throat.   Eyes:  Negative for pain and visual disturbance.  Respiratory:  Negative for cough and shortness of breath.   Cardiovascular:  Negative for chest pain and palpitations.  Gastrointestinal:  Negative for abdominal pain and vomiting.  Genitourinary:  Negative for difficulty urinating, dysuria, hematuria, penile swelling, scrotal swelling and testicular pain.  Musculoskeletal:  Positive for back pain. Negative for arthralgias.  Skin:  Negative for color change and rash.  Neurological:  Negative for seizures and syncope.  All other systems reviewed and are negative.   Physical Exam Updated Vital Signs BP (!) 154/78 (BP Location: Left Arm)   Pulse 79   Temp 97.9 F (36.6 C) (Oral)   Resp 16  Ht 1.753 m (5' 9)   Wt 84.4 kg   SpO2 99%   BMI 27.48 kg/m  Physical Exam Vitals and nursing note reviewed.  Constitutional:      General: He is not in acute distress.    Appearance: Normal appearance. He is well-developed.  HENT:     Head: Normocephalic and atraumatic.  Eyes:     Extraocular Movements: Extraocular movements intact.     Conjunctiva/sclera: Conjunctivae normal.     Pupils: Pupils are equal, round, and reactive to light.  Cardiovascular:     Rate and Rhythm: Normal rate and regular rhythm.     Heart sounds: No murmur heard. Pulmonary:     Effort: Pulmonary  effort is normal. No respiratory distress.     Breath sounds: Normal breath sounds.  Abdominal:     Palpations: Abdomen is soft.     Tenderness: There is no abdominal tenderness.  Musculoskeletal:        General: No swelling or tenderness.     Cervical back: Normal range of motion and neck supple.  Skin:    General: Skin is warm and dry.     Capillary Refill: Capillary refill takes less than 2 seconds.  Neurological:     General: No focal deficit present.     Mental Status: He is alert and oriented to person, place, and time.     Cranial Nerves: No cranial nerve deficit.     Sensory: No sensory deficit.     Motor: No weakness.  Psychiatric:        Mood and Affect: Mood normal.     ED Results / Procedures / Treatments   Labs (all labs ordered are listed, but only abnormal results are displayed) Labs Reviewed - No data to display  EKG None  Radiology No results found.  Procedures Procedures    Medications Ordered in ED Medications - No data to display  ED Course/ Medical Decision Making/ A&P                                 Medical Decision Making  Patient with about a 4-day history of low back pain more to the right side radiating to the right thigh.  Somewhat anteriorly.  Which would be more suggestive of femoral nerve.  Does not involve the scrotum or testicle.  The pain in the back is more midline.  Past medical history significant chronic abdominal pain history of polysubstance abuse.  Database shows that he has not had any narcotics for several months.  Patient uses marijuana and also does smoke.  Other than the pain radiating into the right thigh.  No distal weakness or numbness to the right lower extremity.  Will treat symptomatically.  With a short course of pain medicine and muscle relaxer.  Patient will follow-up with his primary care doctor if not improving in the next 7 days.  Does not require x-rays at this time   Final Clinical Impression(s) / ED  Diagnoses Final diagnoses:  Acute midline low back pain without sciatica    Rx / DC Orders ED Discharge Orders     None         Larri Brewton, MD 09/20/23 1407

## 2023-09-25 ENCOUNTER — Other Ambulatory Visit: Payer: Self-pay

## 2023-11-17 ENCOUNTER — Other Ambulatory Visit: Payer: Self-pay

## 2023-11-17 ENCOUNTER — Emergency Department (HOSPITAL_COMMUNITY)
Admission: EM | Admit: 2023-11-17 | Discharge: 2023-11-17 | Disposition: A | Discharge status: Home / Self Care | Attending: Emergency Medicine | Admitting: Emergency Medicine

## 2023-11-17 ENCOUNTER — Encounter (HOSPITAL_COMMUNITY): Payer: Self-pay | Admitting: Emergency Medicine

## 2023-11-17 DIAGNOSIS — R7309 Other abnormal glucose: Secondary | ICD-10-CM | POA: Diagnosis not present

## 2023-11-17 DIAGNOSIS — R112 Nausea with vomiting, unspecified: Secondary | ICD-10-CM | POA: Insufficient documentation

## 2023-11-17 DIAGNOSIS — R1084 Generalized abdominal pain: Secondary | ICD-10-CM | POA: Insufficient documentation

## 2023-11-17 DIAGNOSIS — D72829 Elevated white blood cell count, unspecified: Secondary | ICD-10-CM | POA: Insufficient documentation

## 2023-11-17 DIAGNOSIS — R252 Cramp and spasm: Secondary | ICD-10-CM | POA: Diagnosis not present

## 2023-11-17 DIAGNOSIS — R197 Diarrhea, unspecified: Secondary | ICD-10-CM | POA: Diagnosis present

## 2023-11-17 LAB — RESP PANEL BY RT-PCR (RSV, FLU A&B, COVID)  RVPGX2
Influenza A by PCR: NEGATIVE
Influenza B by PCR: NEGATIVE
Resp Syncytial Virus by PCR: NEGATIVE
SARS Coronavirus 2 by RT PCR: NEGATIVE

## 2023-11-17 LAB — COMPREHENSIVE METABOLIC PANEL WITH GFR
ALT: 21 U/L (ref 0–44)
AST: 34 U/L (ref 15–41)
Albumin: 4.6 g/dL (ref 3.5–5.0)
Alkaline Phosphatase: 74 U/L (ref 38–126)
Anion gap: 13 (ref 5–15)
BUN: 17 mg/dL (ref 6–20)
CO2: 22 mmol/L (ref 22–32)
Calcium: 9.9 mg/dL (ref 8.9–10.3)
Chloride: 107 mmol/L (ref 98–111)
Creatinine, Ser: 1.18 mg/dL (ref 0.61–1.24)
GFR, Estimated: >60 mL/min (ref >60)
Glucose, Bld: 176 mg/dL — ABNORMAL HIGH (ref 70–99)
Potassium: 4 mmol/L (ref 3.5–5.1)
Sodium: 142 mmol/L (ref 135–145)
Total Bilirubin: 1.8 mg/dL — ABNORMAL HIGH (ref 0.0–1.2)
Total Protein: 8 g/dL (ref 6.5–8.1)

## 2023-11-17 LAB — CBC WITH DIFFERENTIAL/PLATELET
Abs Immature Granulocytes: 0.05 10*3/uL (ref 0.00–0.07)
Basophils Absolute: 0 10*3/uL (ref 0.0–0.1)
Basophils Relative: 0 %
Eosinophils Absolute: 0 10*3/uL (ref 0.0–0.5)
Eosinophils Relative: 0 %
HCT: 41.2 % (ref 39.0–52.0)
Hemoglobin: 13.5 g/dL (ref 13.0–17.0)
Immature Granulocytes: 0 %
Lymphocytes Relative: 4 %
Lymphs Abs: 0.5 10*3/uL — ABNORMAL LOW (ref 0.7–4.0)
MCH: 32.2 pg (ref 26.0–34.0)
MCHC: 32.8 g/dL (ref 30.0–36.0)
MCV: 98.3 fL (ref 80.0–100.0)
Monocytes Absolute: 0.5 10*3/uL (ref 0.1–1.0)
Monocytes Relative: 4 %
Neutro Abs: 11.5 10*3/uL — ABNORMAL HIGH (ref 1.7–7.7)
Neutrophils Relative %: 92 %
Platelets: 292 10*3/uL (ref 150–400)
RBC: 4.19 MIL/uL — ABNORMAL LOW (ref 4.22–5.81)
RDW: 12.1 % (ref 11.5–15.5)
WBC: 12.6 10*3/uL — ABNORMAL HIGH (ref 4.0–10.5)
nRBC: 0 % (ref 0.0–0.2)

## 2023-11-17 LAB — LIPASE, BLOOD: Lipase: 20 U/L (ref 11–51)

## 2023-11-17 LAB — MAGNESIUM: Magnesium: 1.7 mg/dL (ref 1.7–2.4)

## 2023-11-17 MED ORDER — ONDANSETRON 4 MG PO TBDP: 4.0000 mg | ORAL_TABLET | Freq: Three times a day (TID) | ORAL | 0 refills | Status: AC | PRN

## 2023-11-17 MED ORDER — ONDANSETRON HCL 4 MG/2ML IJ SOLN
4.0000 mg | Freq: Once | INTRAMUSCULAR | Status: AC
  Administered 2023-11-17: 4 mg via INTRAVENOUS
  Filled 2023-11-17: qty 2

## 2023-11-17 MED ORDER — HYDROMORPHONE HCL 1 MG/ML IJ SOLN
1.0000 mg | Freq: Once | INTRAMUSCULAR | Status: AC
  Administered 2023-11-17: 1 mg via INTRAVENOUS
  Filled 2023-11-17: qty 1

## 2023-11-17 MED ORDER — SODIUM CHLORIDE 0.9 % IV BOLUS
1000.0000 mL | Freq: Once | INTRAVENOUS | Status: AC
  Administered 2023-11-17: 1000 mL via INTRAVENOUS

## 2023-11-17 MED ORDER — PANTOPRAZOLE SODIUM 40 MG PO TBEC: 40.0000 mg | DELAYED_RELEASE_TABLET | Freq: Every day | ORAL | 0 refills | Status: AC

## 2023-11-17 NOTE — ED Provider Notes (Signed)
 [place] EMERGENCY DEPARTMENT AT [facility] Provider Note   CSN: [id number] Arrival date & time: 11/17/23  8657     History  Chief Complaint  Patient presents with   Emesis    Matthew Scott is a 43 y.o. male.  He has a history of abdominal pain cyclic vomiting syndrome gastroparesis.  Complaining of nausea vomiting diarrhea that started last evening.  Vomited throughout the night.  Associated with diffuse abdominal pain.  Does not think there was any blood in the vomitus or diarrhea.  No known fevers.  Is also complaining of cramps in his legs.  He denies any alcohol but he does endorse marijuana use.  He said he has had the symptoms before but never this bad.  The history is provided by the patient and the EMS personnel.  Emesis Severity:  Moderate Duration:  12 hours Timing:  Constant Associated symptoms: abdominal pain, diarrhea and myalgias   Associated symptoms: no cough and no fever   Abdominal pain:    Location:  Generalized   Quality: aching     Progression:  Unchanged   Chronicity:  Recurrent Risk factors: no alcohol use        Home Medications Prior to Admission medications   Medication Sig Start Date End Date Taking? Authorizing Provider  alum & mag hydroxide-simeth (MYLANTA MAXIMUM STRENGTH) 400-400-40 MG/5ML suspension Take 15 mLs by mouth every 6 (six) hours as needed for indigestion. 09/02/22   Lonell Grandchild, MD  capsaicin (ZOSTRIX) 0.025 % cream Apply topically 2 (two) times daily. 11/11/22   Albertine Grates, MD  Capsaicin-Menthol-Methyl Sal (CAPSAICIN-METHYL SAL-MENTHOL) 0.025-1-12 % CREA Apply cream to abdominal wall as needed for abdominal pain. Wash your hands and Avoid touching eyes or genital region after application of cream as it can cause irritation. Patient not taking: Reported on 11/10/2022 11/08/22   Prosperi, Christian H, PA-C  cyclobenzaprine (FLEXERIL) 10 MG tablet Take 1 tablet (10 mg total) by mouth 3 (three) times daily. 09/20/23    Vanetta Mulders, MD  gabapentin (NEURONTIN) 600 MG tablet Take 1 tablet (600 mg total) by mouth 2 (two) times daily. 12/19/22   [name], PA-C  HYDROcodone-acetaminophen (NORCO/VICODIN) 5-325 MG tablet Take 1 tablet by mouth every 6 (six) hours as needed for severe pain. 12/19/22   [name], PA-C  HYDROcodone-acetaminophen (NORCO/VICODIN) 5-325 MG tablet Take 1 tablet by mouth every 6 (six) hours as needed for moderate pain (pain score 4-6). 09/20/23   Vanetta Mulders, MD  methocarbamol (ROBAXIN) 500 MG tablet Take 1 tablet (500 mg total) by mouth every 8 (eight) hours as needed for muscle spasms. 10/09/22   Rancour, Jeannett Senior, MD  ondansetron (ZOFRAN-ODT) 4 MG disintegrating tablet Take 1 tablet (4 mg total) by mouth every 8 (eight) hours as needed for nausea or vomiting. 09/02/22   Lonell Grandchild, MD  pantoprazole (PROTONIX) 40 MG tablet Take 1 tablet (40 mg total) by mouth daily. 11/11/22 12/11/22  Albertine Grates, MD  valACYclovir (VALTREX) 1000 MG tablet Take 1 tablet (1,000 mg total) by mouth 3 (three) times daily. 12/19/22   [name], PA-C      Allergies    Patient has no known allergies.    Review of Systems   Review of Systems  Constitutional:  Negative for fever.  Respiratory:  Negative for cough.   Cardiovascular:  Negative for chest pain.  Gastrointestinal:  Positive for abdominal pain, diarrhea and vomiting.  Musculoskeletal:  Positive for myalgias.    Physical Exam  Updated Vital Signs BP 136/86 (BP Location: Right Arm)   Pulse 81   Temp 97.7 F (36.5 C)   Resp (!) 22   Ht 5\' 9"  (1.753 m)   Wt 82.6 kg   SpO2 98%   BMI 26.88 kg/m  Physical Exam Vitals and nursing note reviewed.  Constitutional:      General: He is not in acute distress.    Appearance: Normal appearance. He is well-developed.  HENT:     Head: Normocephalic and atraumatic.  Eyes:     Conjunctiva/sclera: Conjunctivae normal.  Cardiovascular:     Rate and Rhythm: Normal rate and regular rhythm.      Heart sounds: No murmur heard. Pulmonary:     Effort: Pulmonary effort is normal. No respiratory distress.     Breath sounds: Normal breath sounds.  Abdominal:     Palpations: Abdomen is soft.     Tenderness: There is abdominal tenderness (generalized). There is no guarding or rebound.  Musculoskeletal:        General: No swelling.     Cervical back: Neck supple.  Skin:    General: Skin is warm and dry.     Capillary Refill: Capillary refill takes less than 2 seconds.  Neurological:     General: No focal deficit present.     Mental Status: He is alert.     Sensory: No sensory deficit.     Motor: No weakness.     ED Results / Procedures / Treatments   Labs (all labs ordered are listed, but only abnormal results are displayed) Labs Reviewed  COMPREHENSIVE METABOLIC PANEL WITH GFR - Abnormal; Notable for the following components:      Result Value   Glucose, Bld 176 (*)    Total Bilirubin 1.8 (*)    All other components within normal limits  CBC WITH DIFFERENTIAL/PLATELET - Abnormal; Notable for the following components:   WBC 12.6 (*)    RBC 4.19 (*)    Neutro Abs 11.5 (*)    Lymphs Abs 0.5 (*)    All other components within normal limits  RESP PANEL BY RT-PCR (RSV, FLU A&B, COVID)  RVPGX2  LIPASE, BLOOD  MAGNESIUM    EKG EKG Interpretation Date/Time:  Friday November 17 2023 09:55:38 EDT Ventricular Rate:  84 PR Interval:  165 QRS Duration:  98 QT Interval:  351 QTC Calculation: 415 R Axis:   8  Text Interpretation: Ectopic atrial rhythm RSR' in V1 or V2, probably normal variant Nonspecific T abnormalities, lateral leads No significant change since prior 1/25 Confirmed by Meridee Score [phone]) on 11/17/2023 9:57:38 AM  Radiology No results found.  Procedures Procedures    Medications Ordered in ED Medications  HYDROmorphone (DILAUDID) injection 1 mg (has no administration in time range)  sodium chloride 0.9 % bolus 1,000 mL (has no administration in time  range)  ondansetron (ZOFRAN) injection 4 mg (has no administration in time range)    ED Course/ Medical Decision Making/ A&P Clinical Course as of 11/17/23 1750  Fri Nov 17, 2023  1111 Patient states he is feeling better and would like to try something to drink. [MB]  1200 Patient would like to be discharged.  Will send him out on PPI and nausea medication. [MB]    Clinical Course User Index [MB] Terrilee Files, MD  Medical Decision Making Amount and/or Complexity of Data Reviewed Labs: ordered.  Risk Prescription drug management.   This patient complains of abdominal pain nausea vomiting diarrhea; this involves an extensive number of treatment Options and is a complaint that carries with it a high risk of complications and morbidity. The differential includes viral syndrome, gastroenteritis, obstruction, gastroparesis, cannabis hyperemesis  I ordered, reviewed and interpreted labs, which included CBC with mildly elevated white count, chemistries and LFTs unremarkable other than elevated glucose and bilirubin, COVID and flu negative I ordered medication IV pain medicine fluids nausea medicine and reviewed PMP when indicated. Previous records obtained and reviewed in epic, no recent admissions but more remote admissions for likely cannabis hyperemesis Cardiac monitoring reviewed, normal sinus rhythm Social determinants considered, tobacco use Critical Interventions: None  After the interventions stated above, I reevaluated the patient and found patient's symptoms to be improved and tolerating p.o. Admission and further testing considered, he feels better so we will forego imaging at this time.  Given prescriptions for PPI and Zofran.  Return instructions discussed.         Final Clinical Impression(s) / ED Diagnoses Final diagnoses:  Nausea vomiting and diarrhea  Muscle cramps    Rx / DC Orders ED Discharge Orders          Ordered     ondansetron (ZOFRAN-ODT) 4 MG disintegrating tablet  Every 8 hours PRN        11/17/23 1202    pantoprazole (PROTONIX) 40 MG tablet  Daily        11/17/23 1202              Terrilee Files, MD 11/17/23 1751

## 2023-11-17 NOTE — ED Triage Notes (Signed)
 Pt BIBA EMS w/ c/o N/V/D for the past 3-4 hours. Pt r/o fever and cramping in abd. 10/10 pain in abd and cramping in legs. Pt was playing bingo last night and could have been exposed to the flu. Pt A&O x4

## 2023-11-17 NOTE — ED Notes (Signed)
 Pt understood d/c instructions and when to return to the ED. Medications were taught and understood. IV was D/C and VS retaken. Pt left by himself to go home.

## 2023-11-18 ENCOUNTER — Encounter (HOSPITAL_COMMUNITY): Payer: Self-pay | Admitting: Emergency Medicine

## 2023-11-18 ENCOUNTER — Emergency Department (HOSPITAL_COMMUNITY)
Admission: EM | Admit: 2023-11-18 | Discharge: 2023-11-18 | Disposition: A | Discharge status: Home / Self Care | Attending: Emergency Medicine | Admitting: Emergency Medicine

## 2023-11-18 DIAGNOSIS — R748 Abnormal levels of other serum enzymes: Secondary | ICD-10-CM | POA: Insufficient documentation

## 2023-11-18 DIAGNOSIS — R112 Nausea with vomiting, unspecified: Secondary | ICD-10-CM | POA: Diagnosis present

## 2023-11-18 DIAGNOSIS — E860 Dehydration: Secondary | ICD-10-CM | POA: Insufficient documentation

## 2023-11-18 DIAGNOSIS — F12188 Cannabis abuse with other cannabis-induced disorder: Secondary | ICD-10-CM | POA: Insufficient documentation

## 2023-11-18 DIAGNOSIS — R1115 Cyclical vomiting syndrome unrelated to migraine: Secondary | ICD-10-CM

## 2023-11-18 DIAGNOSIS — M6282 Rhabdomyolysis: Secondary | ICD-10-CM

## 2023-11-18 LAB — URINALYSIS, ROUTINE W REFLEX MICROSCOPIC
Bacteria, UA: NONE SEEN
Bilirubin Urine: NEGATIVE
Glucose, UA: NEGATIVE mg/dL
Ketones, ur: NEGATIVE mg/dL
Leukocytes,Ua: NEGATIVE
Nitrite: NEGATIVE
Protein, ur: 30 mg/dL — AB
Specific Gravity, Urine: 1.032 — ABNORMAL HIGH (ref 1.005–1.030)
pH: 5 (ref 5.0–8.0)

## 2023-11-18 LAB — CBC WITH DIFFERENTIAL/PLATELET
Abs Immature Granulocytes: 0.03 10*3/uL (ref 0.00–0.07)
Basophils Absolute: 0 10*3/uL (ref 0.0–0.1)
Basophils Relative: 0 %
Eosinophils Absolute: 0 10*3/uL (ref 0.0–0.5)
Eosinophils Relative: 0 %
HCT: 37.9 % — ABNORMAL LOW (ref 39.0–52.0)
Hemoglobin: 12.5 g/dL — ABNORMAL LOW (ref 13.0–17.0)
Immature Granulocytes: 0 %
Lymphocytes Relative: 9 %
Lymphs Abs: 0.9 10*3/uL (ref 0.7–4.0)
MCH: 32.3 pg (ref 26.0–34.0)
MCHC: 33 g/dL (ref 30.0–36.0)
MCV: 97.9 fL (ref 80.0–100.0)
Monocytes Absolute: 0.7 10*3/uL (ref 0.1–1.0)
Monocytes Relative: 7 %
Neutro Abs: 8.5 10*3/uL — ABNORMAL HIGH (ref 1.7–7.7)
Neutrophils Relative %: 84 %
Platelets: 249 10*3/uL (ref 150–400)
RBC: 3.87 MIL/uL — ABNORMAL LOW (ref 4.22–5.81)
RDW: 12.1 % (ref 11.5–15.5)
WBC: 10.2 10*3/uL (ref 4.0–10.5)
nRBC: 0 % (ref 0.0–0.2)

## 2023-11-18 LAB — COMPREHENSIVE METABOLIC PANEL WITH GFR
ALT: 22 U/L (ref 0–44)
AST: 39 U/L (ref 15–41)
Albumin: 4.4 g/dL (ref 3.5–5.0)
Alkaline Phosphatase: 70 U/L (ref 38–126)
Anion gap: 16 — ABNORMAL HIGH (ref 5–15)
BUN: 22 mg/dL — ABNORMAL HIGH (ref 6–20)
CO2: 24 mmol/L (ref 22–32)
Calcium: 9.7 mg/dL (ref 8.9–10.3)
Chloride: 100 mmol/L (ref 98–111)
Creatinine, Ser: 1.03 mg/dL (ref 0.61–1.24)
GFR, Estimated: >60 mL/min (ref >60)
Glucose, Bld: 139 mg/dL — ABNORMAL HIGH (ref 70–99)
Potassium: 3.4 mmol/L — ABNORMAL LOW (ref 3.5–5.1)
Sodium: 140 mmol/L (ref 135–145)
Total Bilirubin: 1.8 mg/dL — ABNORMAL HIGH (ref 0.0–1.2)
Total Protein: 8.3 g/dL — ABNORMAL HIGH (ref 6.5–8.1)

## 2023-11-18 LAB — CK
Total CK: 955 U/L — ABNORMAL HIGH (ref 49–397)
Total CK: 1097 U/L — ABNORMAL HIGH (ref 49–397)
Total CK: 1052 U/L — ABNORMAL HIGH (ref 49–397)

## 2023-11-18 LAB — LIPASE, BLOOD: Lipase: 19 U/L (ref 11–51)

## 2023-11-18 MED ORDER — SODIUM CHLORIDE 0.9 % IV BOLUS
1000.0000 mL | Freq: Once | INTRAVENOUS | Status: AC
  Administered 2023-11-18: 1000 mL via INTRAVENOUS

## 2023-11-18 MED ORDER — SODIUM CHLORIDE 0.9 % IV BOLUS
2000.0000 mL | Freq: Once | INTRAVENOUS | Status: AC
  Administered 2023-11-18: 2000 mL via INTRAVENOUS

## 2023-11-18 MED ORDER — LORAZEPAM 2 MG/ML IJ SOLN
1.0000 mg | Freq: Once | INTRAMUSCULAR | Status: AC
  Administered 2023-11-18: 1 mg via INTRAVENOUS
  Filled 2023-11-18: qty 1

## 2023-11-18 NOTE — ED Provider Notes (Signed)
 Pt signed out by Dr. Wilkie Aye pending IVFs and repeat CK.  UA came back with some myoglobinuria and protein.  Repeat CK more elevated at 1097.  Pt given additional IVFs.  CK drawn after those additional IVFs is 1052 which is on the way down.  Pt feels well.  He looks much better than he did.  He is eating/drinking without any difficulty.  He has to work at 4 pm and does not want to miss work.  He knows to continue drinking lots of fluids.  He knows to return if worse.  F/u with pcp.   Jacalyn Lefevre, MD 11/18/23 [phone]

## 2023-11-18 NOTE — ED Triage Notes (Signed)
 Patient BIB EMS for evaluation of abdominal pain and cramping.  Was seen yesterday at [facility] for same.  C/o symptoms returning around 3 hours PTA.  Was given Rx and has not gotten medications filled.  Vital signs WNL.

## 2023-11-18 NOTE — ED Provider Notes (Signed)
 [place] EMERGENCY DEPARTMENT AT [facility] Provider Note   CSN: [id number] Arrival date & time: 11/18/23  0501     History  Chief Complaint  Patient presents with   Abdominal Pain    Matthew Scott is a 43 y.o. male.  HPI      This is a 43 year old male who presents with abdominal pain.  Brought in by EMS.  Was seen and evaluated yesterday for the same.  Has a history of cyclic vomiting and marijuana use.  Patient reports cramping in his abdomen that goes all the way down to his feet.  Has had nausea and vomiting.  Denies change in stools.  No fevers.  Was given prescriptions yesterday but was unable to get these filled. Home Medications Prior to Admission medications   Medication Sig Start Date End Date Taking? Authorizing Provider  alum & mag hydroxide-simeth (MYLANTA MAXIMUM STRENGTH) 400-400-40 MG/5ML suspension Take 15 mLs by mouth every 6 (six) hours as needed for indigestion. 09/02/22   Lonell Grandchild, MD  capsaicin (ZOSTRIX) 0.025 % cream Apply topically 2 (two) times daily. 11/11/22   Albertine Grates, MD  Capsaicin-Menthol-Methyl Sal (CAPSAICIN-METHYL SAL-MENTHOL) 0.025-1-12 % CREA Apply cream to abdominal wall as needed for abdominal pain. Wash your hands and Avoid touching eyes or genital region after application of cream as it can cause irritation. Patient not taking: Reported on 11/10/2022 11/08/22   Prosperi, Christian H, PA-C  cyclobenzaprine (FLEXERIL) 10 MG tablet Take 1 tablet (10 mg total) by mouth 3 (three) times daily. 09/20/23   Vanetta Mulders, MD  gabapentin (NEURONTIN) 600 MG tablet Take 1 tablet (600 mg total) by mouth 2 (two) times daily. 12/19/22   [name], PA-C  HYDROcodone-acetaminophen (NORCO/VICODIN) 5-325 MG tablet Take 1 tablet by mouth every 6 (six) hours as needed for severe pain. 12/19/22   [name], PA-C  HYDROcodone-acetaminophen (NORCO/VICODIN) 5-325 MG tablet Take 1 tablet by mouth every 6 (six) hours as needed for moderate pain  (pain score 4-6). 09/20/23   Vanetta Mulders, MD  methocarbamol (ROBAXIN) 500 MG tablet Take 1 tablet (500 mg total) by mouth every 8 (eight) hours as needed for muscle spasms. 10/09/22   Rancour, Jeannett Senior, MD  ondansetron (ZOFRAN-ODT) 4 MG disintegrating tablet Take 1 tablet (4 mg total) by mouth every 8 (eight) hours as needed for nausea or vomiting. 11/17/23   Terrilee Files, MD  pantoprazole (PROTONIX) 40 MG tablet Take 1 tablet (40 mg total) by mouth daily. 11/17/23 12/17/23  Terrilee Files, MD  valACYclovir (VALTREX) 1000 MG tablet Take 1 tablet (1,000 mg total) by mouth 3 (three) times daily. 12/19/22   [name], PA-C      Allergies    Patient has no known allergies.    Review of Systems   Review of Systems  Constitutional:  Negative for fever.  Respiratory:  Negative for shortness of breath.   Cardiovascular:  Negative for chest pain.  Gastrointestinal:  Positive for abdominal pain, nausea and vomiting.  Genitourinary:  Negative for dysuria.  All other systems reviewed and are negative.   Physical Exam Updated Vital Signs BP (!) 143/87   Pulse 77   Temp 98 F (36.7 C) (Oral)   Resp 16   SpO2 98%  Physical Exam Vitals and nursing note reviewed.  Constitutional:      Appearance: He is well-developed. He is not ill-appearing.     Comments: Shaking intermittently  HENT:     Head: Normocephalic and atraumatic.  Eyes:     Pupils: Pupils are equal, round, and reactive to light.  Cardiovascular:     Rate and Rhythm: Normal rate and regular rhythm.     Heart sounds: Normal heart sounds. No murmur heard. Pulmonary:     Effort: Pulmonary effort is normal. No respiratory distress.     Breath sounds: Normal breath sounds. No wheezing.  Abdominal:     General: Bowel sounds are normal.     Palpations: Abdomen is soft.     Tenderness: There is no abdominal tenderness. There is no rebound.  Musculoskeletal:     Cervical back: Neck supple.  Lymphadenopathy:     Cervical: No  cervical adenopathy.  Skin:    General: Skin is warm and dry.  Neurological:     Mental Status: He is alert and oriented to person, place, and time.  Psychiatric:        Mood and Affect: Mood normal.     ED Results / Procedures / Treatments   Labs (all labs ordered are listed, but only abnormal results are displayed) Labs Reviewed  CBC WITH DIFFERENTIAL/PLATELET - Abnormal; Notable for the following components:      Result Value   RBC 3.87 (*)    Hemoglobin 12.5 (*)    HCT 37.9 (*)    Neutro Abs 8.5 (*)    All other components within normal limits  COMPREHENSIVE METABOLIC PANEL WITH GFR - Abnormal; Notable for the following components:   Potassium 3.4 (*)    Glucose, Bld 139 (*)    BUN 22 (*)    Total Protein 8.3 (*)    Total Bilirubin 1.8 (*)    Anion gap 16 (*)    All other components within normal limits  CK - Abnormal; Notable for the following components:   Total CK 955 (*)    All other components within normal limits  CK    EKG None  Radiology No results found.  Procedures Procedures    Medications Ordered in ED Medications  sodium chloride 0.9 % bolus 1,000 mL (has no administration in time range)  LORazepam (ATIVAN) injection 1 mg (1 mg Intravenous Given 11/18/23 0529)  sodium chloride 0.9 % bolus 1,000 mL (1,000 mLs Intravenous New Bag/Given 11/18/23 0529)    ED Course/ Medical Decision Making/ A&P Clinical Course as of 11/18/23 0654  Sat Nov 18, 2023  0653 CK elevated.  Will add an additional liter of fluids.  Creatinine is preserved.  Will repeat CK. [CH]    Clinical Course User Index [CH] Louretta Tantillo, Mayer Masker, MD                                 Medical Decision Making Amount and/or Complexity of Data Reviewed Labs: ordered.  Risk Prescription drug management.   This patient presents to the ED for concern of nausea and vomiting, this involves an extensive number of treatment options, and is a complaint that carries with it a high risk of  complications and morbidity.  I considered the following differential and admission for this acute, potentially life threatening condition.  The differential diagnosis includes cyclic vomiting, cannabinoid hyperemesis, SBO, rhabdo  MDM:    This is a 43 year old male with history of cyclic vomiting syndrome who presents with abdominal pain and vomiting.  Seen and evaluated yesterday for the same.  Did not have his medications picked up.  He reports pain and cramping from his abdomen  toes.  Does report some working out and ongoing marijuana use.  Patient was given fluids, and Ativan.  CK is marginally elevated at 900.  Creatinine is preserved.  CMP is otherwise at baseline.  Suspect that he may have some mild CK elevation from dehydration and ongoing vomiting.  Second liter of fluids ordered.  Will repeat CK.  Patient clinically improved with Ativan.  (Labs, imaging, consults)  Labs: I Ordered, and personally interpreted labs.  The pertinent results include: CBC, CMP, lipase  Imaging Studies ordered: I ordered imaging studies including N/A I independently visualized and interpreted imaging. I agree with the radiologist interpretation  Additional history obtained from chart review.  External records from outside source obtained and reviewed including prior evaluations  Cardiac Monitoring: The patient was not maintained on a cardiac monitor.  If on the cardiac monitor, I personally viewed and interpreted the cardiac monitored which showed an underlying rhythm of: N/A  Reevaluation: After the interventions noted above, I reevaluated the patient and found that they have :improved  Social Determinants of Health:  lives independently  Disposition: Recheck CK, anticipate discharge.  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   Qualifier: Diagnosis of  By: Levon Hedger     Nausea and vomiting    chronic, recurrent   Polysubstance abuse (HCC)      Medicines Meds ordered this encounter  Medications   LORazepam (ATIVAN) injection 1 mg   sodium chloride 0.9 % bolus 1,000 mL   sodium chloride 0.9 % bolus 1,000 mL    I have reviewed the patients home medicines and have made adjustments as needed  Problem List / ED Course: Problem List Items Addressed This Visit       Digestive   Cannabinoid hyperemesis syndrome - Primary   Other Visit Diagnoses       Elevated CK         Dehydration                       Final Clinical Impression(s) / ED Diagnoses Final diagnoses:  Cannabinoid hyperemesis syndrome  Elevated CK  Dehydration    Rx / DC Orders ED Discharge Orders     None         Shon Baton, MD 11/18/23 [phone]

## 2024-05-09 ENCOUNTER — Emergency Department (HOSPITAL_COMMUNITY)

## 2024-05-09 ENCOUNTER — Other Ambulatory Visit: Payer: Self-pay

## 2024-05-09 ENCOUNTER — Emergency Department (HOSPITAL_COMMUNITY)
Admission: EM | Admit: 2024-05-09 | Discharge: 2024-05-09 | Disposition: A | Discharge status: Home / Self Care | Attending: Emergency Medicine | Admitting: Emergency Medicine

## 2024-05-09 ENCOUNTER — Encounter (HOSPITAL_COMMUNITY): Payer: Self-pay

## 2024-05-09 DIAGNOSIS — D72829 Elevated white blood cell count, unspecified: Secondary | ICD-10-CM | POA: Diagnosis not present

## 2024-05-09 DIAGNOSIS — R1084 Generalized abdominal pain: Secondary | ICD-10-CM | POA: Insufficient documentation

## 2024-05-09 LAB — COMPREHENSIVE METABOLIC PANEL WITH GFR
ALT: 26 U/L (ref 0–44)
AST: 31 U/L (ref 15–41)
Albumin: 3.8 g/dL (ref 3.5–5.0)
Alkaline Phosphatase: 57 U/L (ref 38–126)
Anion gap: 10 (ref 5–15)
BUN: 9 mg/dL (ref 6–20)
CO2: 24 mmol/L (ref 22–32)
Calcium: 9.5 mg/dL (ref 8.9–10.3)
Chloride: 108 mmol/L (ref 98–111)
Creatinine, Ser: 0.97 mg/dL (ref 0.61–1.24)
GFR, Estimated: >60 mL/min (ref >60)
Glucose, Bld: 104 mg/dL — ABNORMAL HIGH (ref 70–99)
Potassium: 4.3 mmol/L (ref 3.5–5.1)
Sodium: 142 mmol/L (ref 135–145)
Total Bilirubin: 1.7 mg/dL — ABNORMAL HIGH (ref 0.0–1.2)
Total Protein: 7 g/dL (ref 6.5–8.1)

## 2024-05-09 LAB — CBC WITH DIFFERENTIAL/PLATELET
Abs Immature Granulocytes: 0.03 K/uL (ref 0.00–0.07)
Basophils Absolute: 0 K/uL (ref 0.0–0.1)
Basophils Relative: 0 %
Eosinophils Absolute: 0 K/uL (ref 0.0–0.5)
Eosinophils Relative: 0 %
HCT: 38.1 % — ABNORMAL LOW (ref 39.0–52.0)
Hemoglobin: 12.6 g/dL — ABNORMAL LOW (ref 13.0–17.0)
Immature Granulocytes: 0 %
Lymphocytes Relative: 8 %
Lymphs Abs: 1.1 K/uL (ref 0.7–4.0)
MCH: 32.6 pg (ref 26.0–34.0)
MCHC: 33.1 g/dL (ref 30.0–36.0)
MCV: 98.4 fL (ref 80.0–100.0)
Monocytes Absolute: 0.3 K/uL (ref 0.1–1.0)
Monocytes Relative: 3 %
Neutro Abs: 11.7 K/uL — ABNORMAL HIGH (ref 1.7–7.7)
Neutrophils Relative %: 89 %
Platelets: 229 K/uL (ref 150–400)
RBC: 3.87 MIL/uL — ABNORMAL LOW (ref 4.22–5.81)
RDW: 12.3 % (ref 11.5–15.5)
WBC: 13.1 K/uL — ABNORMAL HIGH (ref 4.0–10.5)
nRBC: 0 % (ref 0.0–0.2)

## 2024-05-09 LAB — URINALYSIS, ROUTINE W REFLEX MICROSCOPIC
Bacteria, UA: NONE SEEN
Bilirubin Urine: NEGATIVE
Glucose, UA: NEGATIVE mg/dL
Ketones, ur: NEGATIVE mg/dL
Leukocytes,Ua: NEGATIVE
Nitrite: NEGATIVE
Protein, ur: NEGATIVE mg/dL
Specific Gravity, Urine: 1.024 (ref 1.005–1.030)
pH: 5 (ref 5.0–8.0)

## 2024-05-09 LAB — LIPASE, BLOOD: Lipase: 13 U/L (ref 11–51)

## 2024-05-09 MED ORDER — LACTATED RINGERS IV BOLUS
1000.0000 mL | Freq: Once | INTRAVENOUS | Status: AC
  Administered 2024-05-09: 1000 mL via INTRAVENOUS

## 2024-05-09 MED ORDER — IOHEXOL 350 MG/ML SOLN
75.0000 mL | Freq: Once | INTRAVENOUS | Status: AC | PRN
  Administered 2024-05-09: 75 mL via INTRAVENOUS

## 2024-05-09 NOTE — ED Triage Notes (Signed)
 Pt bib ems for generalized abd pain x 2 days. Pt given 100 fent IV and 4mg  zofran  IV PTA.   SBP initially 220, now 140

## 2024-05-09 NOTE — ED Provider Notes (Signed)
 [place] EMERGENCY DEPARTMENT AT [facility] Provider Note   CSN: [id number] Arrival date & time: 05/09/24  1048     Patient presents with: Abdominal Pain   Matthew Scott is a 43 y.o. male.  Patient with past history significant for cholecystectomy, substance abuse, gastritis and gastroduodenitis, gastroparesis, GERD presents emergency department concerns of abdominal pain.  He reports he has been having generalized abdominal pain for the last 2 days with some associated vomiting and diarrhea that developed this morning.  He does report that he has been around individuals have been sick with somewhat similar symptoms but denies any recent fever, cough, congestion or sore throat.  Endorses difficulty with p.o. intake over last Avril days due to his nausea.  Has some concerns for possible dehydration.  Patient was noted to be hypertensive prior to arriving.   Abdominal Pain      Prior to Admission medications   Medication Sig Start Date End Date Taking? Authorizing Provider  alum & mag hydroxide-simeth (MYLANTA MAXIMUM STRENGTH) 400-400-40 MG/5ML suspension Take 15 mLs by mouth every 6 (six) hours as needed for indigestion. 09/02/22   Francesca Elsie CROME, MD  capsaicin  (ZOSTRIX) 0.025 % cream Apply topically 2 (two) times daily. 11/11/22   Jerri Keys, MD  Capsaicin -Menthol -Methyl Sal (CAPSAICIN -METHYL SAL-MENTHOL ) 0.025-1-12 % CREA Apply cream to abdominal wall as needed for abdominal pain. Wash your hands and Avoid touching eyes or genital region after application of cream as it can cause irritation. Patient not taking: Reported on 11/10/2022 11/08/22   Prosperi, Christian H, PA-C  cyclobenzaprine  (FLEXERIL ) 10 MG tablet Take 1 tablet (10 mg total) by mouth 3 (three) times daily. 09/20/23   Zackowski, Scott, MD  gabapentin  (NEURONTIN ) 600 MG tablet Take 1 tablet (600 mg total) by mouth 2 (two) times daily. 12/19/22   Christopher Savannah, PA-C  HYDROcodone -acetaminophen  (NORCO/VICODIN) 5-325 MG  tablet Take 1 tablet by mouth every 6 (six) hours as needed for severe pain. 12/19/22   Christopher Savannah, PA-C  HYDROcodone -acetaminophen  (NORCO/VICODIN) 5-325 MG tablet Take 1 tablet by mouth every 6 (six) hours as needed for moderate pain (pain score 4-6). 09/20/23   Zackowski, Scott, MD  methocarbamol  (ROBAXIN ) 500 MG tablet Take 1 tablet (500 mg total) by mouth every 8 (eight) hours as needed for muscle spasms. 10/09/22   Rancour, Garnette, MD  ondansetron  (ZOFRAN -ODT) 4 MG disintegrating tablet Take 1 tablet (4 mg total) by mouth every 8 (eight) hours as needed for nausea or vomiting. 11/17/23   Towana Ozell BROCKS, MD  pantoprazole  (PROTONIX ) 40 MG tablet Take 1 tablet (40 mg total) by mouth daily. 11/17/23 12/17/23  Towana Ozell BROCKS, MD  valACYclovir  (VALTREX ) 1000 MG tablet Take 1 tablet (1,000 mg total) by mouth 3 (three) times daily. 12/19/22   Christopher Savannah, PA-C    Allergies: Patient has no known allergies.    Review of Systems  Gastrointestinal:  Positive for abdominal pain.  All other systems reviewed and are negative.   Updated Vital Signs BP 133/72 (BP Location: Right Arm)   Pulse 65   Temp 98.5 F (36.9 C) (Oral)   Resp 16   Ht 5' 9 (1.753 m)   Wt 77.1 kg   SpO2 100%   BMI 25.10 kg/m   Physical Exam Vitals and nursing note reviewed.  Constitutional:      General: He is not in acute distress.    Appearance: He is well-developed.  HENT:     Head: Normocephalic and atraumatic.  Eyes:  Conjunctiva/sclera: Conjunctivae normal.  Cardiovascular:     Rate and Rhythm: Normal rate and regular rhythm.     Heart sounds: No murmur heard. Pulmonary:     Effort: Pulmonary effort is normal. No respiratory distress.     Breath sounds: Normal breath sounds.  Abdominal:     Palpations: Abdomen is soft.     Tenderness: There is abdominal tenderness in the periumbilical area. There is no right CVA tenderness, left CVA tenderness, guarding or rebound. Negative signs include Murphy's sign and  McBurney's sign.     Hernia: No hernia is present.  Musculoskeletal:        General: No swelling.     Cervical back: Neck supple.  Skin:    General: Skin is warm and dry.     Capillary Refill: Capillary refill takes less than 2 seconds.  Neurological:     Mental Status: He is alert.  Psychiatric:        Mood and Affect: Mood normal.     (all labs ordered are listed, but only abnormal results are displayed) Labs Reviewed  CBC WITH DIFFERENTIAL/PLATELET - Abnormal; Notable for the following components:      Result Value   WBC 13.1 (*)    RBC 3.87 (*)    Hemoglobin 12.6 (*)    HCT 38.1 (*)    Neutro Abs 11.7 (*)    All other components within normal limits  COMPREHENSIVE METABOLIC PANEL WITH GFR - Abnormal; Notable for the following components:   Glucose, Bld 104 (*)    Total Bilirubin 1.7 (*)    All other components within normal limits  URINALYSIS, ROUTINE W REFLEX MICROSCOPIC - Abnormal; Notable for the following components:   Hgb urine dipstick SMALL (*)    All other components within normal limits  LIPASE, BLOOD    EKG: EKG Interpretation Date/Time:  Thursday May 09 2024 14:24:28 EDT Ventricular Rate:  66 PR Interval:  106 QRS Duration:  96 QT Interval:  390 QTC Calculation: 408 R Axis:   70  Text Interpretation: sinus rhythm with PACs no acute ST/T changes Confirmed by Freddi Hamilton [phone]) on 05/09/2024 2:48:32 PM  Radiology: CT ABDOMEN PELVIS W CONTRAST Result Date: 05/09/2024 CLINICAL DATA:  Acute nonlocalized abdominal pain. Generalized abdominal pain for 2 days. EXAM: CT ABDOMEN AND PELVIS WITH CONTRAST TECHNIQUE: Multidetector CT imaging of the abdomen and pelvis was performed using the standard protocol following bolus administration of intravenous contrast. RADIATION DOSE REDUCTION: This exam was performed according to the departmental dose-optimization program which includes automated exposure control, adjustment of the mA and/or kV according to  patient size and/or use of iterative reconstruction technique. CONTRAST:  75mL OMNIPAQUE  IOHEXOL  350 MG/ML SOLN COMPARISON:  Abdominal radiographs 11/10/2022. CT abdomen and pelvis 11/08/2022 FINDINGS: Lower chest: Lung bases are clear. Hepatobiliary: Subcentimeter cysts in the liver, unchanged since prior study. No imaging follow-up is indicated. Vague low-attenuation changes in the left lobe of the liver likely representing focal fatty infiltration. Gallbladder is surgically absent. No bile duct dilatation. Pancreas: Unremarkable. No pancreatic ductal dilatation or surrounding inflammatory changes. Spleen: Normal in size without focal abnormality. Adrenals/Urinary Tract: No adrenal gland nodules. Several right intrarenal stones, largest measuring 3 mm diameter. Nephrograms are symmetrical and homogeneous. No hydronephrosis or hydroureter. Bladder is normal. Stomach/Bowel: Stomach is within normal limits. Appendix appears normal. No evidence of bowel wall thickening, distention, or inflammatory changes. Vascular/Lymphatic: No significant vascular findings are present. No enlarged abdominal or pelvic lymph nodes. Reproductive: Prostate is unremarkable. Other:  No abdominal wall hernia or abnormality. No abdominopelvic ascites. Surgical clips at the umbilicus. Musculoskeletal: No acute or significant osseous findings. IMPRESSION: 1. Nonobstructing stone in the right kidney. No ureteral stone or obstruction. 2. No bowel obstruction or inflammation. 3. Focal fatty infiltration of the liver. Electronically Signed   By: Elsie Gravely M.D.   On: 05/09/2024 15:29     Procedures   Medications Ordered in the ED  lactated ringers  bolus 1,000 mL (0 mLs Intravenous Stopped 05/09/24 1502)  iohexol  (OMNIPAQUE ) 350 MG/ML injection 75 mL (75 mLs Intravenous Contrast Given 05/09/24 1503)    Clinical Course as of 05/09/24 1533  Thu May 09, 2024  1514 S. Gen abdominal pain N/V/D. Hx gastroparesis. No nausea after zofran .   [ ]  CT  [ ]  Po challenge  [WB]    Clinical Course User Index [WB] Beam, Elsie, MD                                 Medical Decision Making Amount and/or Complexity of Data Reviewed Labs: ordered.   This patient presents to the ED for concern of abdominal pain, this involves an extensive number of treatment options, and is a complaint that carries with it a high risk of complications and morbidity.  The differential diagnosis includes gastroenteritis, bowel obstruction, hernia, appendicitis, urolithiasis   Co morbidities that complicate the patient evaluation  Cholecystectomy, polysubstance abuse, gastritis and gastroduodenitis, gastroparesis, cannabinoid hyperemesis syndrome, hypertensive urgency   Lab Tests:  I Ordered, and personally interpreted labs.  The pertinent results include: CBC mild leukocytosis at 13.1, CMP with slightly elevated bilirubin at 1.7, UA without signs of infection but some blood present, lipase pending  Imaging Studies ordered:  I ordered imaging studies including CT abdomen pelvis I independently visualized and interpreted imaging which showed pending I agree with the radiologist interpretation   Cardiac Monitoring: / EKG:  The patient was maintained on a cardiac monitor.  I personally viewed and interpreted the cardiac monitored which showed an underlying rhythm of: Sinus rhythm with PACs   Consultations Obtained:  I requested consultation with none,  and discussed lab and imaging findings as well as pertinent plan - they recommend: N/A   Problem List / ED Course / Critical interventions / Medication management  Patient with past history significant for cannabinoid hyperemesis aneurysm, hypertension, GERD, gastritis and gastroduodenitis presents the emergency department concerns of abdominal pain.  Reports he has been experiencing 2 days of generalized abdominal pain.  Given fentanyl  and Zofran  and route by EMS with improvement in symptoms.   States that he has not been able to tolerate p.o. in the last 2 days due to his nausea.  Denies any recent alcohol or cannabis use. On exam, patient has tenderness to the periumbilical region.  No focal guarding present.  Past history significant for prior cholecystectomy. Based on exam, patient is largely well-appearing although does have some tenderness in the abdomen.  Will proceed with labs and consider imaging pending reevaluation. Reevaluated patient and he appears to still have some generalized discomfort present.  White count elevated at 13.1.  Will proceed with CT imaging for evaluation of possible etiology of current pain.  Suspect possible gastritis given patient's combination of vomiting and loose stools that he is reporting.  Less likely differential is appendicitis, bowel obstruction, pancreatitis.  Patient states nausea is still well-controlled with Zofran  given by EMS but has not tried p.o. On  reevaluation, patient is well-appearing but still not tolerating p.o.  Workup unremarkable at this time but CT imaging is currently pending.  Will require signout to oncoming team. I ordered medication including LR bolus for dehydration Reevaluation of the patient after these medicines showed that the patient improved I have reviewed the patients home medicines and have made adjustments as needed  3:33 PM Care of Marsha Endow transferred to Dr. Dorcus and Dr. Garrick at the end of my shift as the patient will require reassessment once labs/imaging have resulted. Patient presentation, ED course, and plan of care discussed with review of all pertinent labs and imaging. Please see his/her note for further details regarding further ED course and disposition. Plan at time of handoff is disposition per CTAP and PO challenge. Suspect likely gastroenteritis and would benefit from antiemetics at discharge. This may be altered or completely changed at the discretion of the oncoming team pending results of  further workup.   Social Determinants of Health:  None   Test / Admission - Considered:  Disposition per oncoming team.  Final diagnoses:  None    ED Discharge Orders     None          Cecily Legrand LABOR, PA-C 05/09/24 1533    Freddi Hamilton, MD 05/14/24 [phone]

## 2024-05-09 NOTE — ED Provider Notes (Signed)
 [place] EMERGENCY DEPARTMENT AT [facility] Provider Note   CSN: [id number] Arrival date & time: 05/09/24  1048     Patient presents with: Abdominal Pain   SAAHIR Scott is a 43 y.o. male.    Abdominal Pain      Prior to Admission medications   Medication Sig Start Date End Date Taking? Authorizing Provider  alum & mag hydroxide-simeth (MYLANTA MAXIMUM STRENGTH) 400-400-40 MG/5ML suspension Take 15 mLs by mouth every 6 (six) hours as needed for indigestion. 09/02/22   Francesca Elsie CROME, MD  capsaicin  (ZOSTRIX) 0.025 % cream Apply topically 2 (two) times daily. 11/11/22   Jerri Keys, MD  Capsaicin -Menthol -Methyl Sal (CAPSAICIN -METHYL SAL-MENTHOL ) 0.025-1-12 % CREA Apply cream to abdominal wall as needed for abdominal pain. Wash your hands and Avoid touching eyes or genital region after application of cream as it can cause irritation. Patient not taking: Reported on 11/10/2022 11/08/22   Prosperi, Christian H, PA-C  cyclobenzaprine  (FLEXERIL ) 10 MG tablet Take 1 tablet (10 mg total) by mouth 3 (three) times daily. 09/20/23   Zackowski, Scott, MD  gabapentin  (NEURONTIN ) 600 MG tablet Take 1 tablet (600 mg total) by mouth 2 (two) times daily. 12/19/22   Christopher Savannah, PA-C  HYDROcodone -acetaminophen  (NORCO/VICODIN) 5-325 MG tablet Take 1 tablet by mouth every 6 (six) hours as needed for severe pain. 12/19/22   Christopher Savannah, PA-C  HYDROcodone -acetaminophen  (NORCO/VICODIN) 5-325 MG tablet Take 1 tablet by mouth every 6 (six) hours as needed for moderate pain (pain score 4-6). 09/20/23   Zackowski, Scott, MD  methocarbamol  (ROBAXIN ) 500 MG tablet Take 1 tablet (500 mg total) by mouth every 8 (eight) hours as needed for muscle spasms. 10/09/22   Rancour, Garnette, MD  ondansetron  (ZOFRAN -ODT) 4 MG disintegrating tablet Take 1 tablet (4 mg total) by mouth every 8 (eight) hours as needed for nausea or vomiting. 11/17/23   Towana Ozell BROCKS, MD  pantoprazole  (PROTONIX ) 40 MG tablet Take 1 tablet (40  mg total) by mouth daily. 11/17/23 12/17/23  Towana Ozell BROCKS, MD  valACYclovir  (VALTREX ) 1000 MG tablet Take 1 tablet (1,000 mg total) by mouth 3 (three) times daily. 12/19/22   Christopher Savannah, PA-C    Allergies: Patient has no known allergies.    Review of Systems  Gastrointestinal:  Positive for abdominal pain.    Updated Vital Signs BP 133/72 (BP Location: Right Arm)   Pulse 65   Temp 98.5 F (36.9 C) (Oral)   Resp 16   Ht 5' 9 (1.753 m)   Wt 77.1 kg   SpO2 100%   BMI 25.10 kg/m   Physical Exam  (all labs ordered are listed, but only abnormal results are displayed) Labs Reviewed  CBC WITH DIFFERENTIAL/PLATELET - Abnormal; Notable for the following components:      Result Value   WBC 13.1 (*)    RBC 3.87 (*)    Hemoglobin 12.6 (*)    HCT 38.1 (*)    Neutro Abs 11.7 (*)    All other components within normal limits  COMPREHENSIVE METABOLIC PANEL WITH GFR - Abnormal; Notable for the following components:   Glucose, Bld 104 (*)    Total Bilirubin 1.7 (*)    All other components within normal limits  URINALYSIS, ROUTINE W REFLEX MICROSCOPIC - Abnormal; Notable for the following components:   Hgb urine dipstick SMALL (*)    All other components within normal limits  LIPASE, BLOOD    EKG: EKG Interpretation Date/Time:  Thursday May 09 2024  14:24:28 EDT Ventricular Rate:  66 PR Interval:  106 QRS Duration:  96 QT Interval:  390 QTC Calculation: 408 R Axis:   70  Text Interpretation: sinus rhythm with PACs no acute ST/T changes Confirmed by Freddi Hamilton [phone]) on 05/09/2024 2:48:32 PM  Radiology: CT ABDOMEN PELVIS W CONTRAST Result Date: 05/09/2024 CLINICAL DATA:  Acute nonlocalized abdominal pain. Generalized abdominal pain for 2 days. EXAM: CT ABDOMEN AND PELVIS WITH CONTRAST TECHNIQUE: Multidetector CT imaging of the abdomen and pelvis was performed using the standard protocol following bolus administration of intravenous contrast. RADIATION DOSE REDUCTION:  This exam was performed according to the departmental dose-optimization program which includes automated exposure control, adjustment of the mA and/or kV according to patient size and/or use of iterative reconstruction technique. CONTRAST:  75mL OMNIPAQUE  IOHEXOL  350 MG/ML SOLN COMPARISON:  Abdominal radiographs 11/10/2022. CT abdomen and pelvis 11/08/2022 FINDINGS: Lower chest: Lung bases are clear. Hepatobiliary: Subcentimeter cysts in the liver, unchanged since prior study. No imaging follow-up is indicated. Vague low-attenuation changes in the left lobe of the liver likely representing focal fatty infiltration. Gallbladder is surgically absent. No bile duct dilatation. Pancreas: Unremarkable. No pancreatic ductal dilatation or surrounding inflammatory changes. Spleen: Normal in size without focal abnormality. Adrenals/Urinary Tract: No adrenal gland nodules. Several right intrarenal stones, largest measuring 3 mm diameter. Nephrograms are symmetrical and homogeneous. No hydronephrosis or hydroureter. Bladder is normal. Stomach/Bowel: Stomach is within normal limits. Appendix appears normal. No evidence of bowel wall thickening, distention, or inflammatory changes. Vascular/Lymphatic: No significant vascular findings are present. No enlarged abdominal or pelvic lymph nodes. Reproductive: Prostate is unremarkable. Other: No abdominal wall hernia or abnormality. No abdominopelvic ascites. Surgical clips at the umbilicus. Musculoskeletal: No acute or significant osseous findings. IMPRESSION: 1. Nonobstructing stone in the right kidney. No ureteral stone or obstruction. 2. No bowel obstruction or inflammation. 3. Focal fatty infiltration of the liver. Electronically Signed   By: Elsie Gravely M.D.   On: 05/09/2024 15:29     Procedures   Medications Ordered in the ED  lactated ringers  bolus 1,000 mL (0 mLs Intravenous Stopped 05/09/24 1502)  iohexol  (OMNIPAQUE ) 350 MG/ML injection 75 mL (75 mLs Intravenous  Contrast Given 05/09/24 1503)    Clinical Course as of 05/09/24 1719  Thu May 09, 2024  1514 S. Gen abdominal pain N/V/D. Hx gastroparesis. No nausea after zofran .  [ x] CT  [ ]  Po challenge  [WB]  1649 CT with nonobstructive right renal stone.  UA without evidence of infection. [WB]    Clinical Course User Index [WB] Christene Pounds, Elsie, MD                                 Medical Decision Making Amount and/or Complexity of Data Reviewed Labs: ordered. Radiology: ordered.  Risk Prescription drug management.   At time of signout, plan to follow-up on CT abdomen and p.o. challenge.  Patient reportedly presenting with N/V/D with history of gastroparesis.  Zofran  given with resolution of nausea.  CTAP indicative of nonobstructive right renal stone without accompanying acute abdominal pathology.  UA without evidence of infection.  Patient p.o. challenge without event.  Per RN, patient reportedly left prior to discharge.   Final diagnoses:  Generalized abdominal pain    ED Discharge Orders     None          Shantai Tiedeman, Elsie, MD 05/09/24 ARDELLA    Garrick Charleston, MD 05/10/24 2210
